# Patient Record
Sex: Male | Born: 1975 | Race: White | Hispanic: No | Marital: Single | Smoking: Current some day smoker
Health system: Southern US, Community
[De-identification: ages and names within clinical notes are randomized; demographics above are authoritative.]

## PROBLEM LIST (undated history)

## (undated) DIAGNOSIS — Z8619 Personal history of other infectious and parasitic diseases: Secondary | ICD-10-CM

## (undated) DIAGNOSIS — F32A Depression, unspecified: Secondary | ICD-10-CM

## (undated) DIAGNOSIS — E785 Hyperlipidemia, unspecified: Secondary | ICD-10-CM

## (undated) DIAGNOSIS — I10 Essential (primary) hypertension: Secondary | ICD-10-CM

## (undated) DIAGNOSIS — K648 Other hemorrhoids: Secondary | ICD-10-CM

## (undated) DIAGNOSIS — F329 Major depressive disorder, single episode, unspecified: Secondary | ICD-10-CM

## (undated) DIAGNOSIS — Z8719 Personal history of other diseases of the digestive system: Secondary | ICD-10-CM

## (undated) DIAGNOSIS — J189 Pneumonia, unspecified organism: Secondary | ICD-10-CM

## (undated) DIAGNOSIS — K589 Irritable bowel syndrome without diarrhea: Secondary | ICD-10-CM

## (undated) DIAGNOSIS — K4020 Bilateral inguinal hernia, without obstruction or gangrene, not specified as recurrent: Secondary | ICD-10-CM

## (undated) DIAGNOSIS — T8859XA Other complications of anesthesia, initial encounter: Secondary | ICD-10-CM

## (undated) DIAGNOSIS — I5022 Chronic systolic (congestive) heart failure: Secondary | ICD-10-CM

## (undated) DIAGNOSIS — F419 Anxiety disorder, unspecified: Secondary | ICD-10-CM

## (undated) DIAGNOSIS — T7840XA Allergy, unspecified, initial encounter: Secondary | ICD-10-CM

## (undated) DIAGNOSIS — K219 Gastro-esophageal reflux disease without esophagitis: Secondary | ICD-10-CM

## (undated) DIAGNOSIS — R112 Nausea with vomiting, unspecified: Secondary | ICD-10-CM

## (undated) DIAGNOSIS — Z9889 Other specified postprocedural states: Secondary | ICD-10-CM

## (undated) DIAGNOSIS — F3171 Bipolar disorder, in partial remission, most recent episode hypomanic: Secondary | ICD-10-CM

## (undated) DIAGNOSIS — F319 Bipolar disorder, unspecified: Secondary | ICD-10-CM

## (undated) DIAGNOSIS — T4145XA Adverse effect of unspecified anesthetic, initial encounter: Secondary | ICD-10-CM

## (undated) DIAGNOSIS — F1021 Alcohol dependence, in remission: Secondary | ICD-10-CM

## (undated) DIAGNOSIS — M5090 Cervical disc disorder, unspecified, unspecified cervical region: Secondary | ICD-10-CM

## (undated) DIAGNOSIS — F1220 Cannabis dependence, uncomplicated: Secondary | ICD-10-CM

## (undated) DIAGNOSIS — E7889 Other lipoprotein metabolism disorders: Secondary | ICD-10-CM

## (undated) HISTORY — PX: NOSE SURGERY: SHX723

## (undated) HISTORY — DX: Major depressive disorder, single episode, unspecified: F32.9

## (undated) HISTORY — DX: Personal history of other infectious and parasitic diseases: Z86.19

## (undated) HISTORY — DX: Depression, unspecified: F32.A

## (undated) HISTORY — DX: Gastro-esophageal reflux disease without esophagitis: K21.9

## (undated) HISTORY — DX: Allergy, unspecified, initial encounter: T78.40XA

## (undated) HISTORY — DX: Irritable bowel syndrome without diarrhea: K58.9

## (undated) HISTORY — DX: Bipolar disorder, unspecified: F31.9

---
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
## 2008-02-03 HISTORY — PX: ANTERIOR CRUCIATE LIGAMENT REPAIR: SHX115

## 2010-08-26 ENCOUNTER — Other Ambulatory Visit: Payer: Self-pay | Admitting: Gastroenterology

## 2010-08-29 ENCOUNTER — Ambulatory Visit
Admission: RE | Admit: 2010-08-29 | Discharge: 2010-08-29 | Disposition: A | Discharge status: Home / Self Care | Payer: PRIVATE HEALTH INSURANCE | Source: Ambulatory Visit | Attending: Gastroenterology | Admitting: Gastroenterology

## 2010-08-29 MED ORDER — IOHEXOL 300 MG/ML  SOLN
100.0000 mL | Freq: Once | INTRAMUSCULAR | Status: AC | PRN
  Administered 2010-08-29: 100 mL via INTRAVENOUS

## 2011-02-24 ENCOUNTER — Encounter: Payer: Self-pay | Admitting: Family Medicine

## 2011-02-24 ENCOUNTER — Ambulatory Visit (INDEPENDENT_AMBULATORY_CARE_PROVIDER_SITE_OTHER): Payer: PRIVATE HEALTH INSURANCE | Admitting: Family Medicine

## 2011-02-24 DIAGNOSIS — F3130 Bipolar disorder, current episode depressed, mild or moderate severity, unspecified: Secondary | ICD-10-CM

## 2011-02-24 DIAGNOSIS — F319 Bipolar disorder, unspecified: Secondary | ICD-10-CM

## 2011-02-24 DIAGNOSIS — I10 Essential (primary) hypertension: Secondary | ICD-10-CM

## 2011-02-24 DIAGNOSIS — Z23 Encounter for immunization: Secondary | ICD-10-CM

## 2011-02-24 DIAGNOSIS — K4020 Bilateral inguinal hernia, without obstruction or gangrene, not specified as recurrent: Secondary | ICD-10-CM

## 2011-02-24 HISTORY — DX: Bilateral inguinal hernia, without obstruction or gangrene, not specified as recurrent: K40.20

## 2011-02-24 LAB — POCT URINALYSIS DIPSTICK
Glucose, UA: NEGATIVE
Ketones, UA: NEGATIVE
Spec Grav, UA: 1.015
Urobilinogen, UA: 0.2

## 2011-02-24 MED ORDER — ZOLPIDEM TARTRATE 5 MG PO TABS: 5.0000 mg | ORAL_TABLET | Freq: Every evening | ORAL | Status: DC | PRN

## 2011-02-24 MED ORDER — LOSARTAN POTASSIUM-HCTZ 50-12.5 MG PO TABS: 1.0000 | ORAL_TABLET | Freq: Every day | ORAL | Status: DC

## 2011-02-24 NOTE — Progress Notes (Signed)
Subjective:    Patient ID: Thomas Williamson, male    DOB: 14-Jul-1975, 36 y.o.   MRN: [id number]  HPI  Thomas Williamson is a 36 year old single male, nonsmoker, who comes in today as a new patient for evaluation of bipolar depression, hypertension, and bilateral inguinal hernias.  His hypertension has been treated with my card is 40 -- 12.5 daily.  He states his blood pressure home is 120/80.  BP here 140/104.  He was originally put on Depakene 500 mg 3 times a day by his psychiatrist because of bipolar depression.  He then was transitioned to lactmil 25 mg daily by a friend of his is a Therapist, sports in [place], [place], where he is originally from.  He also takes Ambien 5 mg nightly for sleep.  We discussed follow-up with Dr. Rolly Pancake, [place].  He also has bilateral inguinal hernias.  The right is greater than the left.  He is relatively asymptomatic.  He is also had a GI workup for abdominal discomfort by Dr. Doneta Public,,,,,,,,,,, workup was negative, except for one polyp.  They felt he had IBS.  Tetanus booster 2011 seasonal flu shot today.  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   HENT: Negative.   Eyes: Negative.   Respiratory: Negative.   Cardiovascular: Negative.   Gastrointestinal: Negative.   Genitourinary: Negative.   Musculoskeletal: Negative.   Skin: Negative.   Neurological: Negative.   Hematological: Negative.   Psychiatric/Behavioral: Negative.        Objective:   Physical Exam  Constitutional: He is oriented to person, place, and time. He appears well-developed and well-nourished.  HENT:  Head: Normocephalic and atraumatic.  Right Ear: External ear normal.  Left Ear: External ear normal.  Nose: Nose normal.  Mouth/Throat: Oropharynx is clear and moist.  Eyes: Conjunctivae and EOM are normal. Pupils are equal, round, and reactive to light.  Neck:  Normal range of motion. Neck supple. No JVD present. No tracheal deviation present. No thyromegaly present.  Cardiovascular: Normal rate, regular rhythm, normal heart sounds and intact distal pulses.  Exam reveals no gallop and no friction rub.   No murmur heard. Pulmonary/Chest: Effort normal and breath sounds normal. No stridor. No respiratory distress. He has no wheezes. He has no rales. He exhibits no tenderness.  Abdominal: Soft. Bowel sounds are normal. He exhibits no distension and no mass. There is no tenderness. There is no rebound and no guarding.  Genitourinary: Prostate normal and penis normal. No penile tenderness.       Bilateral inguinal hernias, right greater than left  Musculoskeletal: Normal range of motion. He exhibits no edema and no tenderness.  Lymphadenopathy:    He has no cervical adenopathy.  Neurological: He is alert and oriented to person, place, and time. He has normal reflexes. No cranial nerve deficit. He exhibits normal muscle tone.  Skin: Skin is warm and dry. No rash noted. No erythema. No pallor.       Total body skin exam shows a scar right chest wall, anterior from previous lesion removed.  It was not a cancer.  He has numerous freckles all of which appear to be benign  Psychiatric: He has a normal mood and affect. His behavior is normal. Judgment and thought content normal.          Assessment & Plan:  Healthy male.  Hypertension switched to Hyzaar 50 -- 25.  BP checked daily at home to be sure blood pressure stays stable.  History of bipolar depression.  Continue lactmil....... Follow-up by Dr. Rolly Pancake, [place], he  Bilateral inguinal hernias asymptomatic.  History of right ACL and MCL tears with subsequent surgical repair.  History of colon polyp and IBS

## 2011-02-24 NOTE — Patient Instructions (Signed)
Switched to the Hyzaar,,,,,,,,,, one tablet daily, and check your blood pressure daily in the morning for the next 4 weeks to be sure your blood pressure stays normal,,,,,,,,,,, 135/85.  Call Dr. Rolly Pancake, [place], and make an appointment to see her ASAP for follow-up.  Return to see [name] in one year for general checkup sooner if any problems

## 2011-02-25 LAB — HEPATIC FUNCTION PANEL
ALT: 36 U/L (ref 0–53)
Total Protein: 8.2 g/dL (ref 6.0–8.3)

## 2011-02-25 LAB — CBC WITH DIFFERENTIAL/PLATELET
Basophils Relative: 1.2 % (ref 0.0–3.0)
Eosinophils Absolute: 0.1 10*3/uL (ref 0.0–0.7)
Eosinophils Relative: 0.7 % (ref 0.0–5.0)
Lymphocytes Relative: 21.6 % (ref 12.0–46.0)
Monocytes Relative: 7.3 % (ref 3.0–12.0)
Neutrophils Relative %: 69.2 % (ref 43.0–77.0)
RBC: 4.25 Mil/uL (ref 4.22–5.81)
WBC: 11.9 10*3/uL — ABNORMAL HIGH (ref 4.5–10.5)

## 2011-02-25 LAB — BASIC METABOLIC PANEL
CO2: 27 mEq/L (ref 19–32)
Calcium: 10 mg/dL (ref 8.4–10.5)
Sodium: 137 mEq/L (ref 135–145)

## 2011-02-25 LAB — LIPID PANEL
HDL: 53.4 mg/dL (ref >39.00)
Triglycerides: 429 mg/dL — ABNORMAL HIGH (ref 0.0–149.0)

## 2011-02-25 LAB — TSH: TSH: 1.38 u[IU]/mL (ref 0.35–5.50)

## 2011-02-26 ENCOUNTER — Ambulatory Visit: Payer: PRIVATE HEALTH INSURANCE | Admitting: Family Medicine

## 2011-07-10 ENCOUNTER — Telehealth: Payer: Self-pay | Admitting: Family Medicine

## 2011-07-10 NOTE — Telephone Encounter (Signed)
Called pt back, states he has IBS and feels like that is why he is having some abdominal pain, stated he has not taken any of his IBS medication because it makes him feel "bloated" he is feeling a little "run down" but thinks it because he has been fighting a cold, since he was congested and coughing all week, stated the coughing and congestion are much better. Pt said he does not want to be seen until insurance kicks unless absolutely necessary. Pt denies any chest pain, raceing pulse or dizziness. I told him to rest get plenty of fluids and call us back this afternoon to let us know how he is feeling.

## 2011-07-10 NOTE — Telephone Encounter (Signed)
Pt just recently started a new job and does not have insurance for another 30 days pt is aware that an office visit starts at $125.00 for self pay, requested to speak to nurse before we scheduled an appt. Pt is Hot and clammy, has fatigue,cough and congestion. Pt requesting to be contacted

## 2011-12-11 ENCOUNTER — Encounter: Payer: Self-pay | Admitting: Internal Medicine

## 2011-12-11 ENCOUNTER — Ambulatory Visit (INDEPENDENT_AMBULATORY_CARE_PROVIDER_SITE_OTHER): Payer: 59 | Admitting: Internal Medicine

## 2011-12-11 VITALS — BP 138/100 | Temp 98.2°F | Wt 186.0 lb

## 2011-12-11 DIAGNOSIS — L739 Follicular disorder, unspecified: Secondary | ICD-10-CM | POA: Insufficient documentation

## 2011-12-11 DIAGNOSIS — L738 Other specified follicular disorders: Secondary | ICD-10-CM

## 2011-12-11 MED ORDER — DOXYCYCLINE HYCLATE 100 MG PO TABS: 100.0000 mg | ORAL_TABLET | Freq: Two times a day (BID) | ORAL | Status: DC

## 2011-12-11 NOTE — Patient Instructions (Signed)
Apply warm compression to affected areas 2-3 times per day for 3-5 days Please call our office if your symptoms do not improve or gets worse.

## 2011-12-11 NOTE — Progress Notes (Signed)
  Subjective:    Patient ID: Thomas Williamson, male    DOB: [birth year], 36 y.o.   MRN: [id number]  HPI  36 year old white male with history of bipolar disorder and hypertension complains of intermittent red bumps along his lower face and jaw line. Symptoms have been on and off for several months. Symptoms may be related to shaving. Occasionally skin lesions will drained like a pimple.   Review of Systems Negative for fever or chills.  Past Medical History  Diagnosis Date  . Allergy   . GERD (gastroesophageal reflux disease)   . IBS (irritable bowel syndrome)     History   Social History  . Marital Status: Unknown    Spouse Name: N/A    Number of Children: N/A  . Years of Education: N/A   Occupational History  . Not on file.   Social History Main Topics  . Smoking status: Never Smoker   . Smokeless tobacco: Not on file  . Alcohol Use: Yes  . Drug Use: No  . Sexually Active:    Other Topics Concern  . Not on file   Social History Narrative  . No narrative on file    No past surgical history on file.  Family History  Problem Relation Age of Onset  . Hypertension Mother   . Hypertension Father     Not on File  Current Outpatient Prescriptions on File Prior to Visit  Medication Sig Dispense Refill  . citalopram (CELEXA) 20 MG tablet Take 20 mg by mouth daily.      Marland Kitchen lamoTRIgine (LAMICTAL) 100 MG tablet Take 100 mg by mouth daily.      Marland Kitchen lamoTRIgine (LAMICTAL) 25 MG tablet Take 25 mg by mouth daily.      Marland Kitchen losartan-hydrochlorothiazide (HYZAAR) 50-12.5 MG per tablet Take 1 tablet by mouth daily.  100 tablet  3    BP 138/100  Temp 98.2 F (36.8 C) (Oral)  Wt 186 lb (84.369 kg)       Objective:   Physical Exam  Constitutional: He appears well-developed and well-nourished.  HENT:  Head: Normocephalic and atraumatic.  Cardiovascular: Normal rate, regular rhythm and normal heart sounds.   Pulmonary/Chest: Effort normal and breath sounds normal. He has no  wheezes.  Skin:       3-4 small carbuncles along right jaw line and 1-2 lesions along left jaw line          Assessment & Plan:

## 2011-12-11 NOTE — Assessment & Plan Note (Signed)
36 year old white male with signs and symptoms of folliculitis of the face. Treat with doxycycline 100 mg twice daily for 10 days. Patient advised to use warm compress. He also understands to sterilize his razor after each use. Patient will also try applying triple antibiotic ointment at bedtime.  Patient advised to call office if symptoms persist or worsen.

## 2012-02-16 ENCOUNTER — Ambulatory Visit (INDEPENDENT_AMBULATORY_CARE_PROVIDER_SITE_OTHER): Payer: 59 | Admitting: Family Medicine

## 2012-02-16 ENCOUNTER — Encounter: Payer: Self-pay | Admitting: Family Medicine

## 2012-02-16 VITALS — BP 160/108 | Temp 98.3°F | Wt 180.0 lb

## 2012-02-16 DIAGNOSIS — K589 Irritable bowel syndrome without diarrhea: Secondary | ICD-10-CM

## 2012-02-16 DIAGNOSIS — K4020 Bilateral inguinal hernia, without obstruction or gangrene, not specified as recurrent: Secondary | ICD-10-CM

## 2012-02-16 NOTE — Progress Notes (Signed)
  Subjective:    Patient ID: Thomas Williamson, male    DOB: [birth year], 37 y.o.   MRN: [id number]  HPI Tyreque is a 37 year old married male nonsmoker who comes in today for evaluation of right and left inguinal hernias  The hernias occurred about a year and a half ago. Recently they seem to be getting worse. He's not having soreness a specimen the left side when he coughs. He has no bowel nor bladder dysfunction except a history of IBS for which he seen Dr. Loraine Leriche m.      Review of Systems    general review of systems otherwise negative Objective:   Physical Exam Well-developed and nourished male no acute distress in the standing position the bulge in the right groin is larger than the left.       Assessment & Plan:  Bilaterally the hernias plan consult with Dr. Ovidio Kin

## 2012-02-16 NOTE — Patient Instructions (Signed)
Dr. Ovidio Kin general surgeon at ,,,,,,, [phone] for consult

## 2012-02-18 ENCOUNTER — Encounter (HOSPITAL_COMMUNITY): Payer: Self-pay | Admitting: Pharmacy Technician

## 2012-02-18 ENCOUNTER — Ambulatory Visit (INDEPENDENT_AMBULATORY_CARE_PROVIDER_SITE_OTHER): Payer: 59 | Admitting: Surgery

## 2012-02-18 ENCOUNTER — Encounter (INDEPENDENT_AMBULATORY_CARE_PROVIDER_SITE_OTHER): Payer: Self-pay | Admitting: Surgery

## 2012-02-18 VITALS — BP 140/90 | HR 64 | Temp 97.0°F | Resp 16 | Ht 70.0 in | Wt 177.6 lb

## 2012-02-18 DIAGNOSIS — K4020 Bilateral inguinal hernia, without obstruction or gangrene, not specified as recurrent: Secondary | ICD-10-CM

## 2012-02-18 NOTE — Progress Notes (Signed)
Re:   Thomas Williamson DOB:   [birth year] MRN:   [id number]  ASSESSMENT AND PLAN: 1.  Bilateral inguinal hernias, right > left.  I discussed the indications and complications of hernia surgery with the patient.  I discussed both the laparoscopic and open approach to hernia repair..  The potential risks of hernia surgery include, but are not limited to, bleeding, infection, open surgery, nerve injury, and recurrence of the hernia.  I provided the patient literature about hernia surgery.   He is familiar with hernia surgery since I did his father's hernia (open).  We talked about him being out of work for 2 weeks post op.  2.  Hypertension x 2 years 3.  Bipolar depression  Followed by Dr. Emerson Monte 4.  Irritable bowel syndrome  Has seen Dr. Judie Petit. Magod 5.  Hypercholesterolemia  Chief Complaint  Patient presents with  . New Evaluation    eval BIH   REFERRING PHYSICIAN: TODD,JEFFREY ALLEN, MD  HISTORY OF PRESENT ILLNESS: Thomas Williamson is a 37 y.o. (DOB: [birth year])  white male whose primary care physician is TODD,JEFFREY ALLEN, MD and comes to me today for bilateral inguinal hernia.  The hernias were actually noticed about 2 years ago on a CT scan when he was being evaluated for abdominal pain. He was seen by Dr. Leary Roca who diagnosed irritable bowel syndrome. Dr. Leary Roca.  He had a cold about a week or 2 ago with a lot of sneezing and coughing. The hernias seemed to bother him more after this.  He has no history of stomach, liver, gallbladder, or pancreas disease. He has had no prior abdominal surgery.    Past Medical History  Diagnosis Date  . Allergy   . GERD (gastroesophageal reflux disease)   . IBS (irritable bowel syndrome)       Past Surgical History  Procedure Date  . Anterior cruciate ligament repair 2010  . Nose surgery [id number]      Current Outpatient Prescriptions  Medication Sig Dispense Refill  . ALPRAZolam (XANAX) 0.5 MG tablet Take 0.5 mg by mouth as  needed.      . citalopram (CELEXA) 20 MG tablet Take 20 mg by mouth daily.      Marland Kitchen doxycycline (VIBRA-TABS) 100 MG tablet Take 1 tablet (100 mg total) by mouth 2 (two) times daily.  20 tablet  0  . lamoTRIgine (LAMICTAL) 200 MG tablet Take 200 mg by mouth daily.      Marland Kitchen losartan-hydrochlorothiazide (HYZAAR) 50-12.5 MG per tablet Take 1 tablet by mouth daily.  100 tablet  3     Not on File  REVIEW OF SYSTEMS: Skin:  No history of rash.  No history of abnormal moles. Infection:  No history of hepatitis or HIV.  No history of MRSA. Neurologic:  No history of stroke.  No history of seizure.  No history of headaches. Cardiac:  Hypertension x 2 years. Pulmonary:  Does not smoke cigarettes.  No asthma or bronchitis.  No OSA/CPAP.  Endocrine:  No diabetes. No thyroid disease. Gastrointestinal:  IBS evaluated by Dr. Berton Lan. Urologic:  No history of kidney stones.  No history of bladder infections. Musculoskeletal:  No history of joint or back disease. Hematologic:  No bleeding disorder.  No history of anemia.  Not anticoagulated. Psycho-social:  Bipolar depression - followed by Dr. Emerson Monte.  He sees her every 4 to 6 months.  SOCIAL and FAMILY HISTORY: Unmarried. Works with Airline pilot for [employer]. I did his father's  hernia, Paula Libra.  PHYSICAL EXAM: BP 140/90  Pulse 64  Temp 97 F (36.1 C) (Temporal)  Resp 16  Ht 5\' 10"  (1.778 m)  Wt 177 lb 9.6 oz (80.559 kg)  BMI 25.48 kg/m2  General: WN WM who is alert and generally healthy appearing.  HEENT: Normal. Pupils equal. Neck: Supple. No mass.  No thyroid mass. Lymph Nodes:  No supraclavicular or cervical nodes. Lungs: Clear to auscultation and symmetric breath sounds. Heart:  RRR. No murmur or rub.  Abdomen: Soft. No mass. No tenderness.  Normal bowel sounds.  No abdominal scars.  Bilateral inguinal hernias - R>L.  Both are reducible. Rectal: Not done. Extremities:  Good strength and ROM  in upper and lower  extremities. Neurologic:  Grossly intact to motor and sensory function. Psychiatric: Has normal mood and affect. Behavior is normal.   DATA REVIEWED: Notes from Dr. Tawanna Cooler.  Ovidio Kin, MD,  [facility] [address] [place]  [address]   [place], [place]    [address] Phone:  [phone] FAX:  [phone]

## 2012-02-18 NOTE — Progress Notes (Addendum)
Need orders for 1-21 surgery, pre op is 1-17 at 100 pm, thanks

## 2012-02-19 ENCOUNTER — Encounter (HOSPITAL_COMMUNITY): Payer: Self-pay

## 2012-02-19 ENCOUNTER — Other Ambulatory Visit (INDEPENDENT_AMBULATORY_CARE_PROVIDER_SITE_OTHER): Payer: Self-pay | Admitting: Surgery

## 2012-02-19 ENCOUNTER — Encounter (HOSPITAL_COMMUNITY)
Admission: RE | Admit: 2012-02-19 | Discharge: 2012-02-19 | Disposition: A | Discharge status: Home / Self Care | Payer: 59 | Source: Ambulatory Visit | Attending: Surgery | Admitting: Surgery

## 2012-02-19 ENCOUNTER — Telehealth (INDEPENDENT_AMBULATORY_CARE_PROVIDER_SITE_OTHER): Payer: Self-pay

## 2012-02-19 ENCOUNTER — Ambulatory Visit (HOSPITAL_COMMUNITY)
Admission: RE | Admit: 2012-02-19 | Discharge: 2012-02-19 | Disposition: A | Discharge status: Home / Self Care | Payer: 59 | Source: Ambulatory Visit | Attending: Surgery | Admitting: Surgery

## 2012-02-19 DIAGNOSIS — Z01812 Encounter for preprocedural laboratory examination: Secondary | ICD-10-CM | POA: Insufficient documentation

## 2012-02-19 DIAGNOSIS — K4020 Bilateral inguinal hernia, without obstruction or gangrene, not specified as recurrent: Secondary | ICD-10-CM | POA: Insufficient documentation

## 2012-02-19 DIAGNOSIS — Z01810 Encounter for preprocedural cardiovascular examination: Secondary | ICD-10-CM | POA: Insufficient documentation

## 2012-02-19 DIAGNOSIS — I10 Essential (primary) hypertension: Secondary | ICD-10-CM | POA: Insufficient documentation

## 2012-02-19 HISTORY — DX: Essential (primary) hypertension: I10

## 2012-02-19 HISTORY — DX: Other specified postprocedural states: R11.2

## 2012-02-19 HISTORY — DX: Other specified postprocedural states: Z98.890

## 2012-02-19 HISTORY — DX: Anxiety disorder, unspecified: F41.9

## 2012-02-19 LAB — SURGICAL PCR SCREEN: Staphylococcus aureus: NEGATIVE

## 2012-02-19 LAB — CBC
HCT: 38.9 % — ABNORMAL LOW (ref 39.0–52.0)
MCV: 96.8 fL (ref 78.0–100.0)
Platelets: 384 10*3/uL (ref 150–400)
RBC: 4.02 MIL/uL — ABNORMAL LOW (ref 4.22–5.81)
RDW: 11.8 % (ref 11.5–15.5)
WBC: 10.4 10*3/uL (ref 4.0–10.5)

## 2012-02-19 LAB — BASIC METABOLIC PANEL
CO2: 27 mEq/L (ref 19–32)
Chloride: 95 mEq/L — ABNORMAL LOW (ref 96–112)
Creatinine, Ser: 0.82 mg/dL (ref 0.50–1.35)
GFR calc Af Amer: >90 mL/min (ref >90)
Potassium: 3.8 mEq/L (ref 3.5–5.1)

## 2012-02-19 MED ORDER — CHLORHEXIDINE GLUCONATE 4 % EX LIQD
1.0000 "application " | Freq: Once | CUTANEOUS | Status: DC
  Filled 2012-02-19: qty 15

## 2012-02-19 NOTE — Pre-Procedure Instructions (Signed)
PREOP CBC, BMET, EKG AND CXR WERE DONE TODAY AT [facility] AS PER ANESTHESIOLOGIST'S GUIDELINES.

## 2012-02-19 NOTE — Patient Instructions (Addendum)
YOUR SURGERY IS SCHEDULED AT [facility]  ON:  Tuesday  1/21  REPORT TO  SHORT STAY CENTER AT: 10:15 AM      PHONE # FOR SHORT STAY IS [phone]  DO NOT EAT  ANYTHING AFTER MIDNIGHT THE NIGHT BEFORE YOUR SURGERY.  NO FOOD, NO CHEWING GUM, NO MINTS, NO CANDIES, NO CHEWING TOBACCO.  YOU MAY HAVE CLEAR LIQUIDS TO DRINK FROM MIDNIGHT UNTIL 6:15 AM DAY OF SURGERY - LIKE WATER.   NOTHING TO DRINK AFTER 6:15 AM DAY OF SURGERY.   PLEASE TAKE THE FOLLOWING MEDICATIONS THE AM OF YOUR SURGERY WITH A FEW SIPS OF WATER:  ALPRAZOLAM, CITALOPRAM, PRILOSEC.  IF YOU USE INHALERS--USE YOUR INHALERS THE AM OF YOUR SURGERY AND BRING INHALERS TO THE HOSPITAL -TAKE TO SURGERY.    IF YOU ARE DIABETIC:  DO NOT TAKE ANY DIABETIC MEDICATIONS THE AM OF YOUR SURGERY.  IF YOU TAKE INSULIN IN THE EVENINGS--PLEASE ONLY TAKE 1/2 NORMAL EVENING DOSE THE NIGHT BEFORE YOUR SURGERY.  NO INSULIN THE AM OF YOUR SURGERY.  IF YOU HAVE SLEEP APNEA AND USE CPAP OR BIPAP--PLEASE BRING THE MASK AND THE TUBING.  DO NOT BRING YOUR MACHINE.  DO NOT BRING VALUABLES, MONEY, CREDIT CARDS.  DO NOT WEAR JEWELRY, MAKE-UP, NAIL POLISH AND NO METAL PINS OR CLIPS IN YOUR HAIR. CONTACT LENS, DENTURES / PARTIALS, GLASSES SHOULD NOT BE WORN TO SURGERY AND IN MOST CASES-HEARING AIDS WILL NEED TO BE REMOVED.  BRING YOUR GLASSES CASE, ANY EQUIPMENT NEEDED FOR YOUR CONTACT LENS. FOR PATIENTS ADMITTED TO THE HOSPITAL--CHECK OUT TIME THE DAY OF DISCHARGE IS 11:00 AM.  ALL INPATIENT ROOMS ARE PRIVATE - WITH BATHROOM, TELEPHONE, TELEVISION AND WIFI INTERNET.  IF YOU ARE BEING DISCHARGED THE SAME DAY OF YOUR SURGERY--YOU CAN NOT DRIVE YOURSELF HOME--AND SHOULD NOT GO HOME ALONE BY TAXI OR BUS.  NO DRIVING OR OPERATING MACHINERY FOR 24 HOURS FOLLOWING ANESTHESIA / PAIN MEDICATIONS.  PLEASE MAKE ARRANGEMENTS FOR SOMEONE TO BE WITH YOU AT HOME THE FIRST 24 HOURS AFTER SURGERY. RESPONSIBLE DRIVER'S NAME - [name]                                        PHONE #   [phone]                             PLEASE READ OVER ANY  FACT SHEETS THAT YOU WERE GIVEN: MRSA INFORMATION, BLOOD TRANSFUSION INFORMATION, INCENTIVE SPIROMETER INFORMATION. FAILURE TO FOLLOW THESE INSTRUCTIONS MAY RESULT IN THE CANCELLATION OF YOUR SURGERY.   PATIENT SIGNATURE_________________________________

## 2012-02-19 NOTE — Telephone Encounter (Signed)
P/O appt 03/09/12@10 :15am pt aware

## 2012-02-23 ENCOUNTER — Ambulatory Visit (HOSPITAL_COMMUNITY)
Admission: RE | Admit: 2012-02-23 | Discharge: 2012-02-23 | Disposition: A | Discharge status: Home / Self Care | Payer: 59 | Source: Ambulatory Visit | Attending: Surgery | Admitting: Surgery

## 2012-02-23 ENCOUNTER — Encounter (HOSPITAL_COMMUNITY): Payer: Self-pay | Admitting: Anesthesiology

## 2012-02-23 ENCOUNTER — Ambulatory Visit (HOSPITAL_COMMUNITY): Payer: 59 | Admitting: Anesthesiology

## 2012-02-23 ENCOUNTER — Encounter (HOSPITAL_COMMUNITY)
Admission: RE | Disposition: A | Discharge status: Home / Self Care | Payer: Self-pay | Source: Ambulatory Visit | Attending: Surgery

## 2012-02-23 ENCOUNTER — Encounter (HOSPITAL_COMMUNITY): Payer: Self-pay | Admitting: *Deleted

## 2012-02-23 DIAGNOSIS — K4020 Bilateral inguinal hernia, without obstruction or gangrene, not specified as recurrent: Secondary | ICD-10-CM

## 2012-02-23 DIAGNOSIS — K589 Irritable bowel syndrome without diarrhea: Secondary | ICD-10-CM | POA: Insufficient documentation

## 2012-02-23 DIAGNOSIS — Z79899 Other long term (current) drug therapy: Secondary | ICD-10-CM | POA: Insufficient documentation

## 2012-02-23 DIAGNOSIS — I10 Essential (primary) hypertension: Secondary | ICD-10-CM | POA: Insufficient documentation

## 2012-02-23 DIAGNOSIS — E7800 Pure hypercholesterolemia, unspecified: Secondary | ICD-10-CM | POA: Insufficient documentation

## 2012-02-23 DIAGNOSIS — K219 Gastro-esophageal reflux disease without esophagitis: Secondary | ICD-10-CM | POA: Insufficient documentation

## 2012-02-23 HISTORY — PX: INGUINAL HERNIA REPAIR: SHX194

## 2012-02-23 HISTORY — PX: INSERTION OF MESH: SHX5868

## 2012-02-23 SURGERY — REPAIR, HERNIA, INGUINAL, BILATERAL, LAPAROSCOPIC
Anesthesia: General | Wound class: Clean

## 2012-02-23 MED ORDER — LACTATED RINGERS IV SOLN
INTRAVENOUS | Status: DC
  Administered 2012-02-23: 15:00:00 via INTRAVENOUS
  Administered 2012-02-23: 1000 mL via INTRAVENOUS

## 2012-02-23 MED ORDER — FENTANYL CITRATE 0.05 MG/ML IJ SOLN
INTRAMUSCULAR | Status: AC
  Filled 2012-02-23: qty 2

## 2012-02-23 MED ORDER — KETOROLAC TROMETHAMINE 30 MG/ML IJ SOLN
15.0000 mg | Freq: Once | INTRAMUSCULAR | Status: AC | PRN
  Administered 2012-02-23: 30 mg via INTRAVENOUS

## 2012-02-23 MED ORDER — ACETAMINOPHEN 10 MG/ML IV SOLN
INTRAVENOUS | Status: AC
  Filled 2012-02-23: qty 100

## 2012-02-23 MED ORDER — HYDROMORPHONE HCL PF 1 MG/ML IJ SOLN
INTRAMUSCULAR | Status: AC
  Filled 2012-02-23: qty 1

## 2012-02-23 MED ORDER — ACETAMINOPHEN 10 MG/ML IV SOLN
INTRAVENOUS | Status: DC | PRN
  Administered 2012-02-23: 1000 mg via INTRAVENOUS

## 2012-02-23 MED ORDER — LIDOCAINE HCL (CARDIAC) 20 MG/ML IV SOLN
INTRAVENOUS | Status: DC | PRN
  Administered 2012-02-23: 100 mg via INTRAVENOUS

## 2012-02-23 MED ORDER — GLYCOPYRROLATE 0.2 MG/ML IJ SOLN
INTRAMUSCULAR | Status: DC | PRN
  Administered 2012-02-23: .6 mg via INTRAVENOUS

## 2012-02-23 MED ORDER — CEFAZOLIN SODIUM-DEXTROSE 2-3 GM-% IV SOLR
INTRAVENOUS | Status: AC
  Filled 2012-02-23: qty 50

## 2012-02-23 MED ORDER — SCOPOLAMINE 1 MG/3DAYS TD PT72
1.0000 | MEDICATED_PATCH | TRANSDERMAL | Status: DC
  Administered 2012-02-23: 1.5 mg via TRANSDERMAL
  Filled 2012-02-23: qty 1

## 2012-02-23 MED ORDER — CEFAZOLIN SODIUM-DEXTROSE 2-3 GM-% IV SOLR
2.0000 g | INTRAVENOUS | Status: AC
  Administered 2012-02-23: 2 g via INTRAVENOUS

## 2012-02-23 MED ORDER — NEOSTIGMINE METHYLSULFATE 1 MG/ML IJ SOLN
INTRAMUSCULAR | Status: DC | PRN
  Administered 2012-02-23: 4 mg via INTRAVENOUS

## 2012-02-23 MED ORDER — ONDANSETRON HCL 4 MG/2ML IJ SOLN
INTRAMUSCULAR | Status: DC | PRN
  Administered 2012-02-23: 4 mg via INTRAVENOUS

## 2012-02-23 MED ORDER — MIDAZOLAM HCL 5 MG/5ML IJ SOLN
INTRAMUSCULAR | Status: DC | PRN
  Administered 2012-02-23: 2 mg via INTRAVENOUS

## 2012-02-23 MED ORDER — OXYCODONE HCL 5 MG PO TABS
5.0000 mg | ORAL_TABLET | Freq: Once | ORAL | Status: AC
  Administered 2012-02-23: 5 mg via ORAL
  Filled 2012-02-23: qty 1

## 2012-02-23 MED ORDER — BUPIVACAINE HCL (PF) 0.25 % IJ SOLN
INTRAMUSCULAR | Status: AC
  Filled 2012-02-23: qty 30

## 2012-02-23 MED ORDER — 0.9 % SODIUM CHLORIDE (POUR BTL) OPTIME
TOPICAL | Status: DC | PRN
  Administered 2012-02-23: 1000 mL

## 2012-02-23 MED ORDER — KETOROLAC TROMETHAMINE 30 MG/ML IJ SOLN
INTRAMUSCULAR | Status: AC
  Filled 2012-02-23: qty 1

## 2012-02-23 MED ORDER — FENTANYL CITRATE 0.05 MG/ML IJ SOLN
INTRAMUSCULAR | Status: DC | PRN
  Administered 2012-02-23 (×5): 50 ug via INTRAVENOUS

## 2012-02-23 MED ORDER — PROMETHAZINE HCL 25 MG/ML IJ SOLN: 6.2500 mg | INTRAMUSCULAR | Status: DC | PRN

## 2012-02-23 MED ORDER — FENTANYL CITRATE 0.05 MG/ML IJ SOLN
25.0000 ug | INTRAMUSCULAR | Status: DC | PRN
  Administered 2012-02-23 (×2): 50 ug via INTRAVENOUS
  Administered 2012-02-23 (×2): 25 ug via INTRAVENOUS

## 2012-02-23 MED ORDER — CISATRACURIUM BESYLATE (PF) 10 MG/5ML IV SOLN
INTRAVENOUS | Status: DC | PRN
  Administered 2012-02-23: 16 mg via INTRAVENOUS

## 2012-02-23 MED ORDER — BUPIVACAINE HCL (PF) 0.25 % IJ SOLN
INTRAMUSCULAR | Status: DC | PRN
  Administered 2012-02-23: 30 mL

## 2012-02-23 MED ORDER — PROPOFOL 10 MG/ML IV BOLUS
INTRAVENOUS | Status: DC | PRN
  Administered 2012-02-23: 150 mg via INTRAVENOUS

## 2012-02-23 MED ORDER — OXYCODONE HCL 5 MG PO TABS: 5.0000 mg | ORAL_TABLET | Freq: Four times a day (QID) | ORAL | Status: DC | PRN

## 2012-02-23 MED ORDER — SCOPOLAMINE 1 MG/3DAYS TD PT72
MEDICATED_PATCH | TRANSDERMAL | Status: AC
  Filled 2012-02-23: qty 1

## 2012-02-23 SURGICAL SUPPLY — 35 items
BENZOIN TINCTURE PRP APPL 2/3 (GAUZE/BANDAGES/DRESSINGS) IMPLANT
CHLORAPREP W/TINT 26ML (MISCELLANEOUS) ×3 IMPLANT
CLOTH BEACON ORANGE TIMEOUT ST (SAFETY) ×3 IMPLANT
DECANTER SPIKE VIAL GLASS SM (MISCELLANEOUS) IMPLANT
DERMABOND ADVANCED (GAUZE/BANDAGES/DRESSINGS) ×1
DERMABOND ADVANCED .7 DNX12 (GAUZE/BANDAGES/DRESSINGS) ×2 IMPLANT
DISSECT BALLN SPACEMKR + OVL (BALLOONS) ×3
DISSECTOR BALLN SPACEMKR + OVL (BALLOONS) ×2 IMPLANT
DISSECTOR BLUNT TIP ENDO 5MM (MISCELLANEOUS) IMPLANT
DRAPE LAPAROSCOPIC ABDOMINAL (DRAPES) ×3 IMPLANT
DRAPE UTILITY 15X26 (DRAPE) ×3 IMPLANT
ELECT REM PT RETURN 9FT ADLT (ELECTROSURGICAL) ×3
ELECTRODE REM PT RTRN 9FT ADLT (ELECTROSURGICAL) ×2 IMPLANT
GLOVE BIOGEL PI IND STRL 7.0 (GLOVE) ×2 IMPLANT
GLOVE BIOGEL PI INDICATOR 7.0 (GLOVE) ×1
GLOVE SURG SIGNA 7.5 PF LTX (GLOVE) ×3 IMPLANT
GOWN STRL NON-REIN LRG LVL3 (GOWN DISPOSABLE) IMPLANT
GOWN STRL REIN XL XLG (GOWN DISPOSABLE) ×9 IMPLANT
KIT BASIN OR (CUSTOM PROCEDURE TRAY) ×3 IMPLANT
MESH C-QUR LITE 10.5X14.6CM (Mesh General) ×6 IMPLANT
NEEDLE INSUFFLATION 14GA 120MM (NEEDLE) IMPLANT
SET IRRIG TUBING LAPAROSCOPIC (IRRIGATION / IRRIGATOR) IMPLANT
SOLUTION ANTI FOG 6CC (MISCELLANEOUS) ×3 IMPLANT
STRIP CLOSURE SKIN 1/2X4 (GAUZE/BANDAGES/DRESSINGS) IMPLANT
SUT MON AB 5-0 PS2 18 (SUTURE) IMPLANT
SUT VIC AB 3-0 SH 18 (SUTURE) IMPLANT
SUT VIC AB 3-0 SH 27 (SUTURE) ×1
SUT VIC AB 3-0 SH 27XBRD (SUTURE) ×2 IMPLANT
SUT VIC AB 5-0 PS2 18 (SUTURE) ×3 IMPLANT
TACKER 5MM HERNIA 3.5CML NAB (ENDOMECHANICALS) ×6 IMPLANT
TOWEL OR 17X26 10 PK STRL BLUE (TOWEL DISPOSABLE) ×6 IMPLANT
TRAY FOLEY CATH 14FRSI W/METER (CATHETERS) ×3 IMPLANT
TRAY LAP CHOLE (CUSTOM PROCEDURE TRAY) ×3 IMPLANT
TROCAR XCEL BLADELESS 5X75MML (TROCAR) ×6 IMPLANT
TUBING INSUFFLATION 10FT LAP (TUBING) ×3 IMPLANT

## 2012-02-23 NOTE — Interval H&P Note (Signed)
History and Physical Interval Note:  02/23/2012 12:43 PM  Thomas Williamson  has presented today for surgery, with the diagnosis of bilateral inguinal hernia  The various methods of treatment have been discussed with the patient and family.  Both parents are here.  After consideration of risks, benefits and other options for treatment, the patient has consented to  Procedure(s) (LRB) with comments: LAPAROSCOPIC BILATERAL INGUINAL HERNIA REPAIR (Bilateral) - Laparoscopic Bilateral Inguinal Hernia Repair as a surgical intervention .    The patient's history has been reviewed, patient examined, no change in status, stable for surgery.  I have reviewed the patient's chart and labs.  Questions were answered to the patient's satisfaction.     Spenser Cong H

## 2012-02-23 NOTE — Anesthesia Preprocedure Evaluation (Signed)
Anesthesia Evaluation  Patient identified by MRN, date of birth, ID band Patient awake    Reviewed: Allergy & Precautions, H&P , NPO status , Patient's Chart, lab work & pertinent test results  History of Anesthesia Complications (+) PONV  Airway Mallampati: II TM Distance: >3 FB Neck ROM: Full    Dental No notable dental hx.    Pulmonary neg pulmonary ROS,  breath sounds clear to auscultation  Pulmonary exam normal       Cardiovascular hypertension, Pt. on medications Rhythm:Regular Rate:Normal     Neuro/Psych Bipolar Disorder negative neurological ROS     GI/Hepatic negative GI ROS, Neg liver ROS,   Endo/Other  negative endocrine ROS  Renal/GU negative Renal ROS  negative genitourinary   Musculoskeletal negative musculoskeletal ROS (+)   Abdominal   Peds negative pediatric ROS (+)  Hematology negative hematology ROS (+)   Anesthesia Other Findings   Reproductive/Obstetrics negative OB ROS                           Anesthesia Physical Anesthesia Plan  ASA: II  Anesthesia Plan: General   Post-op Pain Management:    Induction: Intravenous  Airway Management Planned: Oral ETT  Additional Equipment:   Intra-op Plan:   Post-operative Plan: Extubation in OR  Informed Consent: I have reviewed the patients History and Physical, chart, labs and discussed the procedure including the risks, benefits and alternatives for the proposed anesthesia with the patient or authorized representative who has indicated his/her understanding and acceptance.   Dental advisory given  Plan Discussed with: CRNA and Surgeon  Anesthesia Plan Comments:         Anesthesia Quick Evaluation

## 2012-02-23 NOTE — Anesthesia Postprocedure Evaluation (Signed)
  Anesthesia Post-op Note  Patient: Thomas Williamson  Procedure(s) Performed: Procedure(s) (LRB): LAPAROSCOPIC BILATERAL INGUINAL HERNIA REPAIR (Bilateral) INSERTION OF MESH (N/A)  Patient Location: PACU  Anesthesia Type: General  Level of Consciousness: awake and alert   Airway and Oxygen Therapy: Patient Spontanous Breathing  Post-op Pain: mild  Post-op Assessment: Post-op Vital signs reviewed, Patient's Cardiovascular Status Stable, Respiratory Function Stable, Patent Airway and No signs of Nausea or vomiting  Last Vitals:  Filed Vitals:   02/23/12 1530  BP: 146/95  Pulse: 90  Temp:   Resp: 16    Post-op Vital Signs: stable   Complications: No apparent anesthesia complications

## 2012-02-23 NOTE — Transfer of Care (Signed)
Immediate Anesthesia Transfer of Care Note  Patient: Thomas Williamson  Procedure(s) Performed: Procedure(s) (LRB) with comments: LAPAROSCOPIC BILATERAL INGUINAL HERNIA REPAIR (Bilateral) - Laparoscopic Bilateral Inguinal Hernia Repair with mesh INSERTION OF MESH (N/A)  Patient Location: PACU  Anesthesia Type:General  Level of Consciousness: awake, alert  and oriented  Airway & Oxygen Therapy: Patient Spontanous Breathing and Patient connected to face mask oxygen  Post-op Assessment: Report given to PACU RN and Post -op Vital signs reviewed and stable  Post vital signs: Reviewed and stable  Complications: No apparent anesthesia complications

## 2012-02-23 NOTE — H&P (View-Only) (Signed)
 Re:   Thomas Williamson DOB:   07/09/1975 MRN:   [id number]  ASSESSMENT AND PLAN: 1.  Bilateral inguinal hernias, right > left.  I discussed the indications and complications of hernia surgery with the patient.  I discussed both the laparoscopic and open approach to hernia repair..  The potential risks of hernia surgery include, but are not limited to, bleeding, infection, open surgery, nerve injury, and recurrence of the hernia.  I provided the patient literature about hernia surgery.   He is familiar with hernia surgery since I did his father's hernia (open).  We talked about him being out of work for 2 weeks post op.  2.  Hypertension x 2 years 3.  Bipolar depression  Followed by Dr. Parrish McKinney 4.  Irritable bowel syndrome  Has seen Dr. M. Magod 5.  Hypercholesterolemia  Chief Complaint  Patient presents with  . New Evaluation    eval BIH   REFERRING PHYSICIAN: TODD,JEFFREY ALLEN, MD  HISTORY OF PRESENT ILLNESS: Thomas Williamson is a 37 y.o. (DOB: 01/18/1976)  white male whose primary care physician is TODD,JEFFREY ALLEN, MD and comes to me today for bilateral inguinal hernia.  The hernias were actually noticed about 2 years ago on a CT scan when he was being evaluated for abdominal pain. He was seen by Dr. Mark Magod who diagnosed irritable bowel syndrome. Dr. Mark Magod.  He had a cold about a week or 2 ago with a lot of sneezing and coughing. The hernias seemed to bother him more after this.  He has no history of stomach, liver, gallbladder, or pancreas disease. He has had no prior abdominal surgery.    Past Medical History  Diagnosis Date  . Allergy   . GERD (gastroesophageal reflux disease)   . IBS (irritable bowel syndrome)       Past Surgical History  Procedure Date  . Anterior cruciate ligament repair 2010  . Nose surgery 2000,2012      Current Outpatient Prescriptions  Medication Sig Dispense Refill  . ALPRAZolam (XANAX) 0.5 MG tablet Take 0.5 mg by mouth as  needed.      . citalopram (CELEXA) 20 MG tablet Take 20 mg by mouth daily.      . doxycycline (VIBRA-TABS) 100 MG tablet Take 1 tablet (100 mg total) by mouth 2 (two) times daily.  20 tablet  0  . lamoTRIgine (LAMICTAL) 200 MG tablet Take 200 mg by mouth daily.      . losartan-hydrochlorothiazide (HYZAAR) 50-12.5 MG per tablet Take 1 tablet by mouth daily.  100 tablet  3     Not on File  REVIEW OF SYSTEMS: Skin:  No history of rash.  No history of abnormal moles. Infection:  No history of hepatitis or HIV.  No history of MRSA. Neurologic:  No history of stroke.  No history of seizure.  No history of headaches. Cardiac:  Hypertension x 2 years. Pulmonary:  Does not smoke cigarettes.  No asthma or bronchitis.  No OSA/CPAP.  Endocrine:  No diabetes. No thyroid disease. Gastrointestinal:  IBS evaluated by Dr. M. Magod. Urologic:  No history of kidney stones.  No history of bladder infections. Musculoskeletal:  No history of joint or back disease. Hematologic:  No bleeding disorder.  No history of anemia.  Not anticoagulated. Psycho-social:  Bipolar depression - followed by Dr. Parrish McKinney.  He sees her every 4 to 6 months.  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 I did his father's   hernia, [name].  PHYSICAL EXAM: BP 140/90  Pulse 64  Temp 97 F (36.1 C) (Temporal)  Resp 16  Ht 5' 10" (1.778 m)  Wt 177 lb 9.6 oz (80.559 kg)  BMI 25.48 kg/m2  General: WN WM who is alert and generally healthy appearing.  HEENT: Normal. Pupils equal. Neck: Supple. No mass.  No thyroid mass. Lymph Nodes:  No supraclavicular or cervical nodes. Lungs: Clear to auscultation and symmetric breath sounds. Heart:  RRR. No murmur or rub.  Abdomen: Soft. No mass. No tenderness.  Normal bowel sounds.  No abdominal scars.  Bilateral inguinal hernias - R>L.  Both are reducible. Rectal: Not done. Extremities:  Good strength and ROM  in upper and lower  extremities. Neurologic:  Grossly intact to motor and sensory function. Psychiatric: Has normal mood and affect. Behavior is normal.   DATA REVIEWED: Notes from Dr. Todd.  Brenda Cowher, MD,  [facility] [address]  [address]   [place], [place]    [address] Phone:  [phone] FAX:  [phone]  

## 2012-02-24 ENCOUNTER — Encounter (HOSPITAL_COMMUNITY): Payer: Self-pay | Admitting: Surgery

## 2012-02-24 NOTE — Op Note (Signed)
NAME:  NYLES, MITTON                 ACCOUNT NO.:  [id number]  MEDICAL RECORD NO.:  [id number]  LOCATION:  [facility]                         FACILITY:  [facility]  PHYSICIAN:  Sandria Bales. Ezzard Standing, M.D.  DATE OF BIRTH:  01-28-1976  DATE OF PROCEDURE:  02/23/2012                              OPERATIVE REPORT   PREOPERATIVE DIAGNOSIS:  Bilateral inguinal hernias, right larger than left.  POSTOPERATIVE DIAGNOSIS:  Bilateral direct inguinal hernias, right larger than left.  PROCEDURE:  Laparoscopic bilateral inguinal hernia repair using precut Atrium mesh.  SURGEON:  Sandria Bales. Ezzard Standing, M.D.  FIRST ASSISTANT:  None.  ANESTHESIA:  General endotracheal.  ESTIMATED BLOOD LOSS:  Minimal.  INDICATIONS FOR PROCEDURE:  Mr. Mcglinn is a 37 year old white male who sees Dr. Alonza Smoker as his primary medical doctor.  He has had bilateral inguinal hernias noted about couple of years, but this had been asymptomatic recently.  When they gotten larger and caused more pain.  He now comes for repair of these hernias.  I have discussed with him both laparoscopic and open hernia repairs, but I think because he has got bilateral hernias and had no prior abdominal surgery, is a good candidate for laparoscopic surgery.  Potential risks for hernia surgery include, but are not limited to, bleeding, infection, nerve injury, recurrence of the hernia.  OPERATIVE NOTE:  The patient was placed in room #1 at [facility], had both arms tucked.  Foley catheter in place, given 2 g of Ancef at the initiation of procedure.  A time-out was held and the surgical checklist run.  I accessed his preperitoneal space by going through an infraumbilical incision.  I went to the right of midline and divided the the anterior rectus abdominis fascia.  I then retracted the rectus muscle anteriorly and placed the preperitoneal balloon into the preperitoneal space and insufflated this.  I visualized the pubic bone and both inferior epigastric  vessels in the preperitoneal space.  I then removed the balloon and placed two 5-mm trocars, one in the right lower quadrant and one in the left lower quadrant.  I dissected out this preperitoneal space.  I identified on the right side a sort of fatty large hernia, was mainly had preperitoneal fat in it.  Then, I was able to reduce the hernias.  I identified the cord structures, I saw no evidence of an indirect inguinal hernia and stripped down the peritoneum to the base of the cord structures of the left side.  He had a similar type of hernia and sort of lateral tumor with a lot of preperitoneal fat, which I reduced out.  Again, he had no evidence of an indirect hernia on the left side.  I then encircled the cord structures.  I used a precut C Qur Lite Atrium mesh on both sides.  It was up at the right side first and left side second.  I placed the mesh, lay flat against the inguinal floor and around the cord structures.  I used the Protac on the right and left with 9 Protacs on the right, 12 on the left though, some of these were overlapped in the midline.  Photos were taken  and placed in the chart.  I then deflated the abdomen.  Once make sure there were no areas, which were rolled up, which they did not.  So I thought the mesh lay flat, I had both inguinal floors covered well and it isolated the cord structures on both sides.  The preperitoneal space did limit the exposure.  I then removed the trocars in turn.  There was no bleeding at trocar site.  The anterior fascia was closed with 0 Vicryl suture. Subcutaneous tissue was closed with 3-0 Vicryl suture and the skin closed with 5-0 Monocryl suture and painted with Dermabond.  The patient was transported to the recovery room in good condition. Sponge and needle count were correct at the end of the case.   Sandria Bales. Ezzard Standing, M.D., FACS   DHN/MEDQ  D:  02/23/2012  T:  02/24/2012  Job:  [id number]  cc:   Tinnie Gens A. Tawanna Cooler, MD [address]  [place] [address]  Petra Kuba, M.D. Fax: [phone]

## 2012-02-29 ENCOUNTER — Telehealth (INDEPENDENT_AMBULATORY_CARE_PROVIDER_SITE_OTHER): Payer: Self-pay

## 2012-02-29 NOTE — Telephone Encounter (Signed)
Percocet 5/325mg  po q6hrs Prn Pain wriitten by Dr. Biagio Quint Patient  is aware he needs to pick up RX

## 2012-03-09 ENCOUNTER — Ambulatory Visit (INDEPENDENT_AMBULATORY_CARE_PROVIDER_SITE_OTHER): Payer: 59 | Admitting: Surgery

## 2012-03-09 ENCOUNTER — Encounter (INDEPENDENT_AMBULATORY_CARE_PROVIDER_SITE_OTHER): Payer: Self-pay | Admitting: Surgery

## 2012-03-09 VITALS — BP 142/98 | HR 100 | Temp 98.3°F | Resp 18 | Ht 70.0 in | Wt 176.4 lb

## 2012-03-09 DIAGNOSIS — K4020 Bilateral inguinal hernia, without obstruction or gangrene, not specified as recurrent: Secondary | ICD-10-CM

## 2012-03-09 NOTE — Progress Notes (Signed)
[facility]  Ovidio Kin, MD,  FACS [address] [place]  [address] [place], [place]    [address] Phone:  [phone] FAX:  [phone]   Re:   Thomas Williamson DOB:   1975/10/03 MRN:   [id number]  ASSESSMENT AND PLAN: 1.  Post lap bilateral laparoscopic inguinal hernia repair - 02/23/2012 - D. [facility]  Doing well.  Looks good.  Return appt PRN.  2. Hypertension x 2 years  3. Bipolar depression  Followed by Dr. Emerson Monte  4. Irritable bowel syndrome  Has seen Dr. Judie Petit. Mag[place]  5. Hypercholesterolemia 6.  He's a little under the weather and I wrote him a note to be out of work for a day or two.   HISTORY OF PRESENT ILLNESS: Chief Complaint  Patient presents with  . Routine Post Op    bil hernia repair    Thomas Williamson is a 37 y.o. (DOB: 25-Apr-1975)  white male who is a patient of TODD,JEFFREY ALLEN, MD and comes to me today for follow up of bilateral inguinal hernia repair.  He is doing well and his post op pain has resolved.  He has some other bug that has him feeling under the weather.   PHYSICAL EXAM: BP 142/98  Pulse 100  Temp 98.3 F (36.8 C) (Temporal)  Resp 18  Ht 5\' 10"  (1.778 m)  Wt 176 lb 6.4 oz (80.015 kg)  BMI 25.31 kg/m2  Abdomen:  Incision look good.  No swelling.  DATA REVIEWED: None new.  Ovidio Kin, MD, FACS Office:  [phone]

## 2012-03-22 ENCOUNTER — Other Ambulatory Visit: Payer: Self-pay | Admitting: *Deleted

## 2012-03-22 MED ORDER — LOSARTAN POTASSIUM-HCTZ 50-12.5 MG PO TABS: 1.0000 | ORAL_TABLET | Freq: Every evening | ORAL | Status: DC

## 2012-08-11 ENCOUNTER — Telehealth: Payer: Self-pay | Admitting: Family Medicine

## 2012-08-11 NOTE — Telephone Encounter (Signed)
Spoke with patient and he has been walking on his ankle for a couple of days and is okay to wait until tomorrow

## 2012-08-11 NOTE — Telephone Encounter (Signed)
noted 

## 2012-08-11 NOTE — Telephone Encounter (Signed)
Patient Information:  Caller Name: Jaimes  Phone: [phone]  Patient: Thomas Williamson, Thomas Williamson  Gender: Male  DOB: 06/19/1975  Age: 37 Years  PCP: Kelle Darting [facility])  Office Follow Up:  Does the office need to follow up with this patient?: Yes  Instructions For The Office: NEED SAME DAY APPT W/ DR TODD, NO AVAILBILITY  RN Note:  Pt rolled his Ankle while walking in parking lot at his home, Pt tripped over grade in ground.  Ankle is swollen, however, Pt can walk, denies pain.  Swelling is size of Pt's palm. PLEASE CALL PT BACK W/ SAME DAY APPT, NO AVAILABLITY W/ DR TODD REMAINING.   Symptoms  Reason For Call & Symptoms: Ankle Injury  Reviewed Health History In EMR: Yes  Reviewed Medications In EMR: Yes  Reviewed Allergies In EMR: Yes  Reviewed Surgeries / Procedures: Yes  Date of Onset of Symptoms: 08/06/2012  Treatments Tried: Ibuprofen  Treatments Tried Worked: No  Guideline(s) Used:  Ankle Pain  Ankle and Foot Injury  Disposition Per Guideline:   See Today in Office  Reason For Disposition Reached:   Large swelling or bruise and size > palm of person's hand  Advice Given:  N/A  Patient Will Follow Care Advice:  YES

## 2012-08-11 NOTE — Telephone Encounter (Signed)
Attempted to return call to patient regarding ankle injury 08/06/12.  Left 2 voice mail messages to call office if further assistance needed.  No contact made.

## 2012-08-12 ENCOUNTER — Ambulatory Visit (INDEPENDENT_AMBULATORY_CARE_PROVIDER_SITE_OTHER): Payer: BC Managed Care – PPO | Admitting: Family

## 2012-08-12 ENCOUNTER — Ambulatory Visit (INDEPENDENT_AMBULATORY_CARE_PROVIDER_SITE_OTHER)
Admission: RE | Admit: 2012-08-12 | Discharge: 2012-08-12 | Disposition: A | Discharge status: Home / Self Care | Payer: BC Managed Care – PPO | Source: Ambulatory Visit | Attending: Family | Admitting: Family

## 2012-08-12 ENCOUNTER — Encounter: Payer: Self-pay | Admitting: Family

## 2012-08-12 VITALS — BP 136/100 | HR 100 | Wt 183.0 lb

## 2012-08-12 DIAGNOSIS — S99912A Unspecified injury of left ankle, initial encounter: Secondary | ICD-10-CM

## 2012-08-12 DIAGNOSIS — S8990XA Unspecified injury of unspecified lower leg, initial encounter: Secondary | ICD-10-CM

## 2012-08-12 DIAGNOSIS — S99919A Unspecified injury of unspecified ankle, initial encounter: Secondary | ICD-10-CM

## 2012-08-12 MED ORDER — OXYCODONE-ACETAMINOPHEN 5-325 MG PO TABS: 1.0000 | ORAL_TABLET | Freq: Three times a day (TID) | ORAL | Status: DC | PRN

## 2012-08-12 NOTE — Patient Instructions (Addendum)

## 2012-08-12 NOTE — Progress Notes (Signed)
Subjective:    Patient ID: Thomas Williamson, male    DOB: [birth year], 37 y.o.   MRN: [id number]  HPI 37 year old white male, nonsmoker, patient of Dr. Tawanna Cooler is in today with complaints of left ankle pain after rolling his ankle over 6 days ago. Patient reports he stepped down off a curb into her storm drain while walking downtown and everted his ankle. Continues to have bruising, swelling, and pain. Pain is 7/10, describes as achy. Worse in the mornings. Patient reports he continues to walk with the left but able to bear weight. Has been taken anti-inflammatory medications and using ice with no relief.   Review of Systems  Constitutional: Negative.   Respiratory: Negative.   Cardiovascular: Negative.   Gastrointestinal: Negative.   Musculoskeletal: Negative for back pain.       Left ankle pain, bruising, and swelling  Skin: Negative.   Allergic/Immunologic: Negative.   Neurological: Negative.   Psychiatric/Behavioral: Negative.    Past Medical History  Diagnosis Date  . Allergy   . GERD (gastroesophageal reflux disease)   . IBS (irritable bowel syndrome)   . Hypertension   . Anxiety   . PONV (postoperative nausea and vomiting)     History   Social History  . Marital Status: Single    Spouse Name: N/A    Number of Children: N/A  . Years of Education: N/A   Occupational History  . Not on file.   Social History Main Topics  . Smoking status: Never Smoker   . Smokeless tobacco: Never Used  . Alcohol Use: Yes     Comment: Social  . Drug Use: No  . Sexually Active:    Other Topics Concern  . Not on file   Social History Narrative  . No narrative on file    Past Surgical History  Procedure Laterality Date  . Anterior cruciate ligament repair  2010  . Nose surgery  4098,[id number]  . Inguinal hernia repair  02/23/2012    Procedure: LAPAROSCOPIC BILATERAL INGUINAL HERNIA REPAIR;  Surgeon: Kandis Cocking, MD;  Location: [facility];  Service: General;  Laterality: Bilateral;   Laparoscopic Bilateral Inguinal Hernia Repair with mesh  . Insertion of mesh  02/23/2012    Procedure: INSERTION OF MESH;  Surgeon: Kandis Cocking, MD;  Location: [facility];  Service: General;  Laterality: N/A;    Family History  Problem Relation Age of Onset  . Hypertension Mother   . Hypertension Father     Allergies  Allergen Reactions  . Hydrocodone     UPSETS STOMACH    Current Outpatient Prescriptions on File Prior to Visit  Medication Sig Dispense Refill  . ALPRAZolam (XANAX) 0.5 MG tablet Take 0.5 mg by mouth as needed. anxiety      . lamoTRIgine (LAMICTAL) 200 MG tablet Take 200 mg by mouth every evening.       Marland Kitchen losartan-hydrochlorothiazide (HYZAAR) 50-12.5 MG per tablet Take 1 tablet by mouth every evening.  90 tablet  1  . omeprazole (PRILOSEC) 20 MG capsule Take 20 mg by mouth daily.      . citalopram (CELEXA) 20 MG tablet Take 20 mg by mouth daily with lunch.       . doxycycline (VIBRA-TABS) 100 MG tablet        No current facility-administered medications on file prior to visit.    BP 136/100  Pulse 100  Wt 183 lb (83.008 kg)  BMI 26.26 kg/m2  SpO2 98%chart    Objective:  Physical Exam  Constitutional: He is oriented to person, place, and time. He appears well-developed and well-nourished.  Neck: Normal range of motion. Neck supple.  Cardiovascular: Normal rate and regular rhythm.   Pulmonary/Chest: Breath sounds normal.  Abdominal: Soft.  Musculoskeletal: Normal range of motion.  Left ankle: Moderately swollen, bruising in the area stages. Pain with flexion and eversion. Minimal pain with inversion. Pedal pulses 2 out of 2. Pinpoint tenderness over the distal tibia  Neurological: He is alert and oriented to person, place, and time.  Skin: Skin is warm and dry.  Psychiatric: He has a normal mood and affect.          Assessment & Plan:  Assessment: 1. Left ankle sprain 2. Left ankle pain  Plan: Sprain instructions given. We'll x-ray the left ankle  to be sure he does not have a fracture. Elevate the leg, continue ice, continue ibuprofen. Oxycodone as needed for extreme pain. Warned of drowsiness. Recheck a schedule in the as needed

## 2012-08-16 ENCOUNTER — Telehealth: Payer: Self-pay | Admitting: Family Medicine

## 2012-08-16 NOTE — Telephone Encounter (Signed)
Pt saw Padonda on 7/11 for an ankle injury. It is not fx'd but he is still in a lot of pain. He will be out of pain med after tomorrow. He is not able to stay off it, as he is gong to job interviews. He would like a refill.new rx of pain med if possible. Padonda rx'd him oxyCODONE-acetaminophen (ROXICET) 5-325 MG per tablet Please advise and call pt.  He states that these pills were supposed to be 1 Q 8 hrs. He says he has to take 1 every 4 hrs to keep pain under control, but pain is still there.

## 2012-08-16 NOTE — Telephone Encounter (Signed)
Left message on machine for patient for patient

## 2012-08-16 NOTE — Telephone Encounter (Signed)
Since he has not responded to conservative therapy he'll need to call Dr. Cleophas Dunker and Dr. Cleophas Dunker and they will see him tomorrow

## 2012-08-17 ENCOUNTER — Telehealth: Payer: Self-pay | Admitting: Family Medicine

## 2012-08-17 NOTE — Telephone Encounter (Signed)
Reon [phone]; Reason for Call: Due to continued swelling on ankle, patient had called 08/16/12 for refill/increase in pain med, since this is the same injury as when he was seen. Advised per EPIC: " Roderick Pee, MD at 08/16/2012 5:15 PM   Status: Signed            Since he has not responded to conservative therapy he'll need to call Dr. Cleophas Dunker and Dr. Cleophas Dunker and they will see him tomorrow    Patient given name of office of [facility], [phone], (per Fleet Contras) states he is between jobs without insurance. Unhappy office cannot offer him more pain med.

## 2012-09-28 ENCOUNTER — Other Ambulatory Visit: Payer: Self-pay | Admitting: Family Medicine

## 2012-11-02 ENCOUNTER — Ambulatory Visit (INDEPENDENT_AMBULATORY_CARE_PROVIDER_SITE_OTHER): Payer: BC Managed Care – PPO | Admitting: Internal Medicine

## 2012-11-02 ENCOUNTER — Encounter: Payer: Self-pay | Admitting: Internal Medicine

## 2012-11-02 VITALS — BP 155/100 | HR 105 | Temp 97.8°F | Wt 183.0 lb

## 2012-11-02 DIAGNOSIS — I10 Essential (primary) hypertension: Secondary | ICD-10-CM

## 2012-11-02 DIAGNOSIS — K122 Cellulitis and abscess of mouth: Secondary | ICD-10-CM

## 2012-11-02 MED ORDER — AMOXICILLIN-POT CLAVULANATE 875-125 MG PO TABS: 1.0000 | ORAL_TABLET | Freq: Two times a day (BID) | ORAL | Status: DC

## 2012-11-02 NOTE — Progress Notes (Signed)
Chief Complaint  Patient presents with  . Sore Throat    Started 4 days ago.  Hard to swallow.  . Sinusitis    HPI: Patient comes in today for SDA for  new problem evaluation. Pt dr TODD  Onset 5 days of quick onset of very sore throat higher up than usual  Then intense sorethroat  no exposure to strep did have it when he was younger has a history of sinus problem and drainage along with nose surgery. This feels different minor cough and runny nose and if at all  Warm liquids feel okay but is very painful.  Uvula feels swollen and tender   No fever.     Hasn't had something that feels just like this.  Blood pressure has been checked in about a month but had been high normal in the 130s. No excessive alcohol or decongestants at this time. Taking his blood pressure medication. ROS: See pertinent positives and negatives per HPI. Uses occasional alcohol denies any specific injury to the back of his throat although a week ago did use marijuana states he could have gotten smashed in the back of his throat but doesn't remember any discomfort at all.  Past Medical History  Diagnosis Date  . Allergy   . GERD (gastroesophageal reflux disease)   . IBS (irritable bowel syndrome)   . Hypertension   . Anxiety   . PONV (postoperative nausea and vomiting)     Family History  Problem Relation Age of Onset  . Hypertension Mother   . Hypertension Father     History   Social History  . Marital Status: Single    Spouse Name: N/A    Number of Children: N/A  . Years of Education: N/A   Social History Main Topics  . Smoking status: Never Smoker   . Smokeless tobacco: Never Used  . Alcohol Use: Yes     Comment: Social  . Drug Use: No  . Sexual Activity:    Other Topics Concern  . None   Social History Narrative  . None    Outpatient Encounter Prescriptions as of 11/02/2012  Medication Sig Dispense Refill  . ALPRAZolam (XANAX) 0.5 MG tablet Take 0.5 mg by mouth as needed. anxiety       . lamoTRIgine (LAMICTAL) 200 MG tablet Take 200 mg by mouth every evening.       Marland Kitchen losartan-hydrochlorothiazide (HYZAAR) 50-12.5 MG per tablet TAKE 1 TABLET BY MOUTH EVERY EVENING.  90 tablet  1  . omeprazole (PRILOSEC) 20 MG capsule Take 20 mg by mouth daily.      . sertraline (ZOLOFT) 50 MG tablet Take 50 mg by mouth daily.      Marland Kitchen amoxicillin-clavulanate (AUGMENTIN) 875-125 MG per tablet Take 1 tablet by mouth every 12 (twelve) hours.  20 tablet  0  . [DISCONTINUED] citalopram (CELEXA) 20 MG tablet Take 20 mg by mouth daily with lunch.       . [DISCONTINUED] doxycycline (VIBRA-TABS) 100 MG tablet       . [DISCONTINUED] oxyCODONE-acetaminophen (ROXICET) 5-325 MG per tablet Take 1 tablet by mouth every 8 (eight) hours as needed for pain.  20 tablet  0   No facility-administered encounter medications on file as of 11/02/2012.    EXAM:  BP 155/100  Pulse 105  Temp(Src) 97.8 F (36.6 C) (Oral)  Wt 183 lb (83.008 kg)  BMI 26.26 kg/m2  SpO2 98%  Body mass index is 26.26 kg/(m^2).  GENERAL: vitals reviewed and listed  above, alert, oriented, appears well hydrated and in no acute distress looks a bit tired nontoxic normal voice to be slightly hoarse  HEENT: atraumatic, conjunctiva  clear, no obvious abnormalities on inspection of external nose and ears OP : Airway is patent uvula is swollen +2 with possible exudate in the posterior pharynx with slight exudate but no edema NECK: no obvious masses on inspection palpation no significant adenopathy shoddy nodes Repeat blood pressure 155/100 right arm LUNGS: clear to auscultation bilaterally, no wheezes, rales or rhonchi, good air movement  CV: HRRR, no clubbing cyanosis or  peripheral edema nl cap refill   MS: moves all extremities without noticeable focal  abnormality  PSYCH: pleasant and cooperative, no obvious depression or anxiety  ASSESSMENT AND PLAN:  Discussed the following assessment and plan:  Uvulitis - Plan: POC Rapid  Strep A, Throat culture ([facility])  Hypertension Uncertain cause empiric treatment for bacterial infection although I suppose could be traumatic chemical from MJ  But not classic history for such discussed symptomatic treatment pending strep test. Increase blood pressure medication if confirmed elevated at home and followup with Dr. Tawanna Cooler.  -Patient advised to return or notify health care team  if symptoms worsen or persist or new concerns arise.  Patient Instructions  Uncertain cause  But pending culture tess. Treat for bacterial uvula infection Salt water gargles    Ibuprofen    Add antibiotic  For now.   Check your bp readings at home over the next week or so   If not at goal below 140/90   Then take   2  Of the blood pressure  Medication  Per day  In the short run and  Plan fu with  Dr.  Tawanna Cooler.  Repeat  150 /100 BP.    Uvulitis Uvulitis is redness and soreness (inflammation) of the uvula. The uvula is the small tongue-shaped piece of tissue in the back of your mouth.  CAUSES Infection is a common cause of uvulitis. Infection of the uvula can be either viral or bacterial. Infectious uvulitis usually only occurs in association with another condition, such as inflammation and infection of the mouth or throat. Other causes of uvulitis include:  Trauma to the uvula.  Swelling from excess fluid buildup (edema), which may be an allergic reaction.  Inhalation of irritants, such as chemical agents, smoke, or steam. DIAGNOSIS Your caregiver can usually diagnose uvulitis through a physical examination. Bacterial uvulitis can be diagnosed through the results of the growth of samples of bodily substances taken from your mouth (cultures). HOME CARE INSTRUCTIONS   Rest as much as possible.  Young children may suck on frozen juice bars or frozen ice pops. Older children and adults may gargle with a warm or cold liquid to help soothe the throat. (Mix  tsp of salt in 8 oz of water, or use strong  tea.)  Use a cool-mist humidifier to lessen throat irritation and cough.  Drink enough fluids to keep your urine clear or pale yellow.  While the throat is very sore, eat soft or liquid foods such as milk, ice cream, soups, or milk drinks.  Family members who develop a sore throat or fever should have a medical exam or throat culture.  If your child has uvulitis and is taking antibiotic medicine, wait 24 hours or until his or her temperature is near normal (less than 100 F [37.8 C]) before allowing him or her to return to school or day care.  Only take over-the-counter or prescription  medicines for pain, discomfort, or fever as directed by your caregiver. Ask when your test results will be ready. Make sure you get your test results. SEEK MEDICAL CARE IF:   You have an oral temperature above 102 F (38.9 C).  You develop large, tender lumps your the neck.  Your child develops a rash.  You cough up green, yellow-brown, or bloody substances. SEEK IMMEDIATE MEDICAL CARE IF:   You develop any new symptoms, such as vomiting, earache, severe headache, stiff neck, chest pain, or trouble breathing or swallowing.  Your airway is blocked.  You develop more severe throat pain along with drooling or voice changes. Document Released: 08/30/2003 Document Revised: 04/13/2011 Document Reviewed: 03/27/2010 [facility] Patient Information 2014 [place], [place].      Neta Mends. Panosh M.D.

## 2012-11-02 NOTE — Patient Instructions (Addendum)
Uncertain cause  But pending culture tess. Treat for bacterial uvula infection Salt water gargles    Ibuprofen    Add antibiotic  For now.   Check your bp readings at home over the next week or so   If not at goal below 140/90   Then take   2  Of the blood pressure  Medication  Per day  In the short run and  Plan fu with  Dr.  Tawanna Cooler.  Repeat  150 /100 BP.    Uvulitis Uvulitis is redness and soreness (inflammation) of the uvula. The uvula is the small tongue-shaped piece of tissue in the back of your mouth.  CAUSES Infection is a common cause of uvulitis. Infection of the uvula can be either viral or bacterial. Infectious uvulitis usually only occurs in association with another condition, such as inflammation and infection of the mouth or throat. Other causes of uvulitis include:  Trauma to the uvula.  Swelling from excess fluid buildup (edema), which may be an allergic reaction.  Inhalation of irritants, such as chemical agents, smoke, or steam. DIAGNOSIS Your caregiver can usually diagnose uvulitis through a physical examination. Bacterial uvulitis can be diagnosed through the results of the growth of samples of bodily substances taken from your mouth (cultures). HOME CARE INSTRUCTIONS   Rest as much as possible.  Young children may suck on frozen juice bars or frozen ice pops. Older children and adults may gargle with a warm or cold liquid to help soothe the throat. (Mix  tsp of salt in 8 oz of water, or use strong tea.)  Use a cool-mist humidifier to lessen throat irritation and cough.  Drink enough fluids to keep your urine clear or pale yellow.  While the throat is very sore, eat soft or liquid foods such as milk, ice cream, soups, or milk drinks.  Family members who develop a sore throat or fever should have a medical exam or throat culture.  If your child has uvulitis and is taking antibiotic medicine, wait 24 hours or until his or her temperature is near normal (less than  100 F [37.8 C]) before allowing him or her to return to school or day care.  Only take over-the-counter or prescription medicines for pain, discomfort, or fever as directed by your caregiver. Ask when your test results will be ready. Make sure you get your test results. SEEK MEDICAL CARE IF:   You have an oral temperature above 102 F (38.9 C).  You develop large, tender lumps your the neck.  Your child develops a rash.  You cough up green, yellow-brown, or bloody substances. SEEK IMMEDIATE MEDICAL CARE IF:   You develop any new symptoms, such as vomiting, earache, severe headache, stiff neck, chest pain, or trouble breathing or swallowing.  Your airway is blocked.  You develop more severe throat pain along with drooling or voice changes. Document Released: 08/30/2003 Document Revised: 04/13/2011 Document Reviewed: 03/27/2010 [facility] Patient Information 2014 [place], [place].

## 2012-11-04 LAB — CULTURE, GROUP A STREP

## 2012-11-11 ENCOUNTER — Telehealth: Payer: Self-pay | Admitting: Family Medicine

## 2012-11-11 NOTE — Telephone Encounter (Signed)
Pt states that upon seeing Dr. Fabian Sharp, and realizing that his BP was elevated, Dr. Fabian Sharp recommended doubling his dosage of BP medication. He would like to have Dr. Nelida Meuse permission prior to doing so. Please advise.

## 2012-11-14 NOTE — Telephone Encounter (Signed)
Per Dr Tawanna Cooler, agrees with double on BP medication.  He should also keep a 30 day record of blood pressure readings and come in for an office visit if no improvement.  Patient agrees.

## 2013-01-29 ENCOUNTER — Other Ambulatory Visit: Payer: Self-pay | Admitting: Family Medicine

## 2013-02-01 ENCOUNTER — Telehealth: Payer: Self-pay | Admitting: Family Medicine

## 2013-02-01 MED ORDER — LOSARTAN POTASSIUM-HCTZ 50-12.5 MG PO TABS: ORAL_TABLET | ORAL | Status: DC

## 2013-02-01 NOTE — Telephone Encounter (Signed)
Fleet Contras it's give him the Hyzaar 100-25 dispense 100 tabs directions 1 tab daily in the morning for high blood pressure no refills because he needs to come in for a CPX in January or February

## 2013-02-01 NOTE — Telephone Encounter (Signed)
Rx sent 

## 2013-02-01 NOTE — Telephone Encounter (Signed)
Patient called requesting a refill on losartan-hydrochlorothiazide [facility]) Stated he was told to double up to get his blood pressure down and now he has ran out Would like his prescription doubled Please advise

## 2013-09-02 ENCOUNTER — Other Ambulatory Visit: Payer: Self-pay | Admitting: Family Medicine

## 2013-09-11 ENCOUNTER — Telehealth: Payer: Self-pay | Admitting: Family Medicine

## 2013-09-11 NOTE — Telephone Encounter (Signed)
Appointment made

## 2013-09-11 NOTE — Telephone Encounter (Signed)
Pt has blood in stool, 2-3 days, towards the end of the stool, light blood in toilet and blood on tissue.

## 2013-09-12 ENCOUNTER — Ambulatory Visit (INDEPENDENT_AMBULATORY_CARE_PROVIDER_SITE_OTHER): Payer: 59 | Admitting: Family Medicine

## 2013-09-12 ENCOUNTER — Encounter: Payer: Self-pay | Admitting: Family Medicine

## 2013-09-12 VITALS — BP 130/108 | Temp 97.9°F | Wt 182.0 lb

## 2013-09-12 DIAGNOSIS — K648 Other hemorrhoids: Secondary | ICD-10-CM

## 2013-09-12 HISTORY — DX: Other hemorrhoids: K64.8

## 2013-09-12 MED ORDER — STARCH 51 % RE SUPP: 1.0000 | RECTAL | Status: DC | PRN

## 2013-09-12 NOTE — Patient Instructions (Signed)
Drink lots of water  Stool softener,,,,,,,,,,,,,,, milk of magnesia 3 tablespoons twice daily  Medicated suppositories,,,,,,,,,, 1 rectally x10 days  If the bleeding persists after 10 days call your gastroenterologist for further consultation

## 2013-09-12 NOTE — Progress Notes (Signed)
Pre visit review using our clinic review tool, if applicable. No additional management support is needed unless otherwise documented below in the visit note. 

## 2013-09-12 NOTE — Progress Notes (Signed)
   Subjective:    Patient ID: Thomas Williamson, male    DOB: [birth year], 38 y.o.   MRN: [id number]  HPI Thomas Williamson is a 38 year old male nonsmoker who comes in today for evaluation of bright red rectal bleeding painless for 2 days  Had a normal bowel movement on Sunday morning and noticed some blood when he wiped. Yesterday he noticed blood in the toilet. It's painless. No fever chills nausea vomiting diarrhea or constipation. He does have a history of IBS but no recent constipation. He's had a colonoscopy in the past by Dr. Posey Thomas Williamson because of abdominal pain. Workup showed IBS one polyp which was removed.  No family history of colon cancer is   Review of Systems Review of systems otherwise negative    Objective:   Physical Exam  Well-developed well nourished male no acute distress examination of the rectum appears normal no external hemorrhoids. Digital rectal exam shows a normal prostate exam 6 no palpable masses guaiac-negative      Assessment & Plan:  Bleeding internal hemorrhoid,,,,,,,,,,,,

## 2013-09-13 ENCOUNTER — Telehealth: Payer: Self-pay | Admitting: Family Medicine

## 2013-09-13 NOTE — Telephone Encounter (Signed)
The rx starch (ANUSOL) 51 % suppository (tucks) Are no longer made. Pharm would like to know what you would have them do?

## 2013-09-14 MED ORDER — HYDROCORTISONE ACE-PRAMOXINE 1-1 % RE FOAM
1.0000 | Freq: Two times a day (BID) | RECTAL | Status: DC
Start: 2013-09-14 — End: 2014-06-26

## 2013-09-14 NOTE — Telephone Encounter (Signed)
Spoke with pharmacy and new Rx sent.

## 2013-09-20 ENCOUNTER — Other Ambulatory Visit (INDEPENDENT_AMBULATORY_CARE_PROVIDER_SITE_OTHER): Payer: 59

## 2013-09-20 DIAGNOSIS — Z0000 Encounter for general adult medical examination without abnormal findings: Secondary | ICD-10-CM

## 2013-09-20 LAB — CBC WITH DIFFERENTIAL/PLATELET
BASOS ABS: 0.1 10*3/uL (ref 0.0–0.1)
Basophils Relative: 0.7 % (ref 0.0–3.0)
Eosinophils Absolute: 0.2 10*3/uL (ref 0.0–0.7)
Eosinophils Relative: 2.2 % (ref 0.0–5.0)
HCT: 38.4 % — ABNORMAL LOW (ref 39.0–52.0)
HEMOGLOBIN: 13 g/dL (ref 13.0–17.0)
LYMPHS ABS: 2.7 10*3/uL (ref 0.7–4.0)
Lymphocytes Relative: 36.4 % (ref 12.0–46.0)
MCHC: 33.8 g/dL (ref 30.0–36.0)
MCV: 95.8 fl (ref 78.0–100.0)
MONO ABS: 0.6 10*3/uL (ref 0.1–1.0)
Monocytes Relative: 7.7 % (ref 3.0–12.0)
NEUTROS ABS: 3.9 10*3/uL (ref 1.4–7.7)
Neutrophils Relative %: 53 % (ref 43.0–77.0)
Platelets: 294 10*3/uL (ref 150.0–400.0)
RBC: 4.01 Mil/uL — ABNORMAL LOW (ref 4.22–5.81)
RDW: 13.1 % (ref 11.5–15.5)
WBC: 7.4 10*3/uL (ref 4.0–10.5)

## 2013-09-20 LAB — BASIC METABOLIC PANEL
BUN: 13 mg/dL (ref 6–23)
CALCIUM: 9.1 mg/dL (ref 8.4–10.5)
CO2: 27 mEq/L (ref 19–32)
CREATININE: 0.8 mg/dL (ref 0.4–1.5)
Chloride: 102 mEq/L (ref 96–112)
GFR: 123.66 mL/min (ref >60.00)
Glucose, Bld: 84 mg/dL (ref 70–99)
Potassium: 3.7 mEq/L (ref 3.5–5.1)
Sodium: 138 mEq/L (ref 135–145)

## 2013-09-20 LAB — POCT URINALYSIS DIPSTICK
Blood, UA: NEGATIVE
Glucose, UA: NEGATIVE
LEUKOCYTES UA: NEGATIVE
NITRITE UA: NEGATIVE
Spec Grav, UA: 1.025
UROBILINOGEN UA: 0.2
pH, UA: 5.5

## 2013-09-20 LAB — HEPATIC FUNCTION PANEL
ALK PHOS: 60 U/L (ref 39–117)
ALT: 25 U/L (ref 0–53)
AST: 23 U/L (ref 0–37)
Albumin: 4 g/dL (ref 3.5–5.2)
Bilirubin, Direct: 0.1 mg/dL (ref 0.0–0.3)
TOTAL PROTEIN: 7.5 g/dL (ref 6.0–8.3)
Total Bilirubin: 0.5 mg/dL (ref 0.2–1.2)

## 2013-09-20 LAB — LIPID PANEL
CHOL/HDL RATIO: 7
CHOLESTEROL: 239 mg/dL — AB (ref 0–200)
HDL: 35.6 mg/dL — ABNORMAL LOW (ref >39.00)
NONHDL: 203.4
Triglycerides: 302 mg/dL — ABNORMAL HIGH (ref 0.0–149.0)
VLDL: 60.4 mg/dL — ABNORMAL HIGH (ref 0.0–40.0)

## 2013-09-20 LAB — TSH: TSH: 0.48 u[IU]/mL (ref 0.35–4.50)

## 2013-09-20 LAB — LDL CHOLESTEROL, DIRECT: LDL DIRECT: 172.5 mg/dL

## 2013-09-25 ENCOUNTER — Encounter: Payer: BC Managed Care – PPO | Admitting: Family Medicine

## 2013-09-29 ENCOUNTER — Encounter: Payer: Self-pay | Admitting: Family Medicine

## 2013-09-29 ENCOUNTER — Ambulatory Visit (INDEPENDENT_AMBULATORY_CARE_PROVIDER_SITE_OTHER): Payer: 59 | Admitting: Family Medicine

## 2013-09-29 VITALS — BP 124/88 | Temp 97.7°F | Ht 69.5 in | Wt 182.0 lb

## 2013-09-29 DIAGNOSIS — E789 Disorder of lipoprotein metabolism, unspecified: Secondary | ICD-10-CM

## 2013-09-29 DIAGNOSIS — K589 Irritable bowel syndrome without diarrhea: Secondary | ICD-10-CM

## 2013-09-29 DIAGNOSIS — E7889 Other lipoprotein metabolism disorders: Secondary | ICD-10-CM

## 2013-09-29 DIAGNOSIS — Z23 Encounter for immunization: Secondary | ICD-10-CM

## 2013-09-29 DIAGNOSIS — I10 Essential (primary) hypertension: Secondary | ICD-10-CM

## 2013-09-29 DIAGNOSIS — F319 Bipolar disorder, unspecified: Secondary | ICD-10-CM

## 2013-09-29 DIAGNOSIS — F3130 Bipolar disorder, current episode depressed, mild or moderate severity, unspecified: Secondary | ICD-10-CM

## 2013-09-29 HISTORY — DX: Other lipoprotein metabolism disorders: E78.89

## 2013-09-29 MED ORDER — LOSARTAN POTASSIUM-HCTZ 50-12.5 MG PO TABS: ORAL_TABLET | ORAL | Status: DC

## 2013-09-29 NOTE — Patient Instructions (Signed)
Continue your blood pressure medication  Continue your exercise and good health habits  Return in one year for general physical examination sooner if any problem

## 2013-09-29 NOTE — Progress Notes (Signed)
   Subjective:    Patient ID: Thomas Williamson, male    DOB: May 17, 1975, 38 y.o.   MRN: [id number]  HPI Erikson is a 38 year old male who comes in today for general physical examination because of a history of hypertension  He takes Hyzaar 50/12.5 daily for hypertension we will BP 124/88  He gets routine eye care because he has contacts, regular dental care, GI followup by Dr. Posey Rea. He's been diagnosed with IBS colonoscopy also showed a polyp.  Father was diagnosed at age 13 with prostate cancer  Vaccinations updated by Apolonio Schneiders   Review of Systems  Constitutional: Negative.   HENT: Negative.   Eyes: Negative.   Respiratory: Negative.   Cardiovascular: Negative.   Gastrointestinal: Negative.   Genitourinary: Negative.   Musculoskeletal: Negative.   Skin: Negative.   Neurological: Negative.   Psychiatric/Behavioral: Negative.        Objective:   Physical Exam  Nursing note and vitals reviewed. Constitutional: He is oriented to person, place, and time. He appears well-developed and well-nourished.  HENT:  Head: Normocephalic and atraumatic.  Right Ear: External ear normal.  Left Ear: External ear normal.  Nose: Nose normal.  Mouth/Throat: Oropharynx is clear and moist.  Eyes: Conjunctivae and EOM are normal. Pupils are equal, round, and reactive to light.  Neck: Normal range of motion. Neck supple. No JVD present. No tracheal deviation present. No thyromegaly present.  Cardiovascular: Normal rate, regular rhythm, normal heart sounds and intact distal pulses.  Exam reveals no gallop and no friction rub.   No murmur heard. No carotid neurologic bruits peripheral pulses 2+ and symmetrical  Pulmonary/Chest: Effort normal and breath sounds normal. No stridor. No respiratory distress. He has no wheezes. He has no rales. He exhibits no tenderness.  Abdominal: Soft. Bowel sounds are normal. He exhibits no distension and no mass. There is no tenderness. There is no rebound and no guarding.    Genitourinary: Penis normal.  Rectal exam a couple months ago prostate normal and therefore not repeated  Musculoskeletal: Normal range of motion. He exhibits no edema and no tenderness.  Lymphadenopathy:    He has no cervical adenopathy.  Neurological: He is alert and oriented to person, place, and time. He has normal reflexes. No cranial nerve deficit. He exhibits normal muscle tone.  Skin: Skin is warm and dry. No rash noted. No erythema. No pallor.  Total body skin exam normal he has a lot of freckles on his back and chest extremities are fairly well spared. Scar midline were anterior chest wall from previous lesion removed by his dermatologist. 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 His behavior is normal. Judgment and thought content normal.          Assessment & Plan:  Healthy male  Hypertension at goal continue current therapy  Reflux esophagitis OTC Prilosec  Bipolar depression continue followup by Dr. Caprice Beaver and continue the Lamictal 200 mg daily along with Xanax 0.5 when necessary  Family history prostate cancer yearly screening by physical exam......Marland Kitchen begin PSA screening at age 32

## 2013-09-29 NOTE — Progress Notes (Signed)
Pre visit review using our clinic review tool, if applicable. No additional management support is needed unless otherwise documented below in the visit note. 

## 2013-10-13 ENCOUNTER — Encounter: Payer: Self-pay | Admitting: Family Medicine

## 2013-10-13 ENCOUNTER — Ambulatory Visit (INDEPENDENT_AMBULATORY_CARE_PROVIDER_SITE_OTHER): Payer: 59 | Admitting: Family Medicine

## 2013-10-13 VITALS — BP 132/90 | HR 78 | Temp 97.8°F | Wt 181.0 lb

## 2013-10-13 DIAGNOSIS — J018 Other acute sinusitis: Secondary | ICD-10-CM

## 2013-10-13 DIAGNOSIS — R062 Wheezing: Secondary | ICD-10-CM

## 2013-10-13 MED ORDER — AMOXICILLIN-POT CLAVULANATE 875-125 MG PO TABS: 1.0000 | ORAL_TABLET | Freq: Two times a day (BID) | ORAL | Status: DC

## 2013-10-13 MED ORDER — PREDNISONE 10 MG PO TABS
ORAL_TABLET | ORAL | Status: DC
Start: 2013-10-13 — End: 2014-02-03

## 2013-10-13 NOTE — Progress Notes (Signed)
Pre visit review using our clinic review tool, if applicable. No additional management support is needed unless otherwise documented below in the visit note. 

## 2013-10-13 NOTE — Patient Instructions (Signed)

## 2013-10-13 NOTE — Progress Notes (Signed)
   Subjective:    Patient ID: Thomas Williamson, male    DOB: 03/06/1975, 38 y.o.   MRN: [id number]  Cough Associated symptoms include wheezing. Pertinent negatives include no chills, fever or shortness of breath.   Acute visit. Over one week history of sore throat, sinus pressure, cough, yellowish nasal discharge. Intermittent headaches. Malaise. Mostly left maxillary sinus pressure. No relief with over-the-counter medications. His cough has gotten worse over the week. Intermittent wheezing.  Past Medical History  Diagnosis Date  . Allergy   . GERD (gastroesophageal reflux disease)   . IBS (irritable bowel syndrome)   . Hypertension   . Anxiety   . PONV (postoperative nausea and vomiting)    Past Surgical History  Procedure Laterality Date  . Anterior cruciate ligament repair  2010  . Nose surgery  6811,5726  . Inguinal hernia repair  02/23/2012    Procedure: LAPAROSCOPIC BILATERAL INGUINAL HERNIA REPAIR;  Surgeon: Shann Medal, MD;  Location: [facility];  Service: General;  Laterality: Bilateral;  Laparoscopic Bilateral Inguinal Hernia Repair with mesh  . Insertion of mesh  02/23/2012    Procedure: INSERTION OF MESH;  Surgeon: Shann Medal, MD;  Location: [facility];  Service: General;  Laterality: N/A;    reports that he has never smoked. He has never used smokeless tobacco. He reports that he drinks alcohol. He reports that he does not use illicit drugs. family history includes Hypertension in his father and mother. Allergies  Allergen Reactions  . Hydrocodone     UPSETS STOMACH      Review of Systems  Constitutional: Negative for fever and chills.  HENT: Positive for congestion and sinus pressure.   Respiratory: Positive for cough and wheezing. Negative for shortness of breath.        Objective:   Physical Exam  Constitutional: He appears well-developed and well-nourished.  HENT:  Right Ear: External ear normal.  Left Ear: External ear normal.  Mouth/Throat: Oropharynx is  clear and moist.  Neck: Neck supple.  Cardiovascular: Normal rate.   Pulmonary/Chest: Effort normal. No respiratory distress. He has wheezes. He has no rales.  Lymphadenopathy:    He has no cervical adenopathy.          Assessment & Plan:  Acute sinusitis. He has evidence for reactive airway component with wheezing but no respiratory distress. Augmentin 875 mg twice a day for 10 days. Prednisone taper starting at 40 mg daily. Work note written for today and tomorrow.

## 2013-10-30 ENCOUNTER — Ambulatory Visit: Payer: 59 | Admitting: *Deleted

## 2013-11-20 ENCOUNTER — Ambulatory Visit: Payer: 59 | Admitting: *Deleted

## 2013-11-22 ENCOUNTER — Ambulatory Visit (INDEPENDENT_AMBULATORY_CARE_PROVIDER_SITE_OTHER): Payer: 59 | Admitting: *Deleted

## 2013-11-22 DIAGNOSIS — Z23 Encounter for immunization: Secondary | ICD-10-CM

## 2013-12-08 ENCOUNTER — Other Ambulatory Visit: Payer: 59

## 2013-12-12 ENCOUNTER — Other Ambulatory Visit (INDEPENDENT_AMBULATORY_CARE_PROVIDER_SITE_OTHER): Payer: 59

## 2013-12-12 DIAGNOSIS — E7889 Other lipoprotein metabolism disorders: Secondary | ICD-10-CM

## 2013-12-12 LAB — LIPID PANEL
Cholesterol: 253 mg/dL — ABNORMAL HIGH (ref 0–200)
HDL: 33.2 mg/dL — AB (ref >39.00)
NonHDL: 219.8
TRIGLYCERIDES: 363 mg/dL — AB (ref 0.0–149.0)
Total CHOL/HDL Ratio: 8
VLDL: 72.6 mg/dL — AB (ref 0.0–40.0)

## 2013-12-13 LAB — LDL CHOLESTEROL, DIRECT: Direct LDL: 159.6 mg/dL

## 2013-12-25 ENCOUNTER — Other Ambulatory Visit: Payer: Self-pay | Admitting: *Deleted

## 2013-12-25 MED ORDER — PRAVASTATIN SODIUM 10 MG PO TABS: 10.0000 mg | ORAL_TABLET | Freq: Every day | ORAL | Status: DC

## 2014-02-03 ENCOUNTER — Emergency Department (HOSPITAL_COMMUNITY)
Admission: EM | Admit: 2014-02-03 | Discharge: 2014-02-03 | Disposition: A | Discharge status: Home / Self Care | Payer: 59 | Source: Home / Self Care | Attending: Family Medicine | Admitting: Family Medicine

## 2014-02-03 ENCOUNTER — Encounter (HOSPITAL_COMMUNITY): Payer: Self-pay | Admitting: Emergency Medicine

## 2014-02-03 DIAGNOSIS — J40 Bronchitis, not specified as acute or chronic: Secondary | ICD-10-CM

## 2014-02-03 MED ORDER — IPRATROPIUM BROMIDE 0.06 % NA SOLN: 2.0000 | Freq: Four times a day (QID) | NASAL | Status: DC

## 2014-02-03 MED ORDER — BENZONATATE 100 MG PO CAPS: 100.0000 mg | ORAL_CAPSULE | Freq: Three times a day (TID) | ORAL | Status: DC

## 2014-02-03 MED ORDER — PREDNISONE 10 MG PO TABS: 30.0000 mg | ORAL_TABLET | Freq: Every day | ORAL | Status: DC

## 2014-02-03 MED ORDER — GUAIFENESIN-CODEINE 100-10 MG/5ML PO SOLN: 5.0000 mL | Freq: Every evening | ORAL | Status: DC | PRN

## 2014-02-03 NOTE — Discharge Instructions (Signed)
Thank you for coming in today. Call or go to the emergency room if you get worse, have trouble breathing, have chest pains, or palpitations.  Do not take codeine cough medication and work or drive.  Acute Bronchitis Bronchitis is inflammation of the airways that extend from the windpipe into the lungs (bronchi). The inflammation often causes mucus to develop. This leads to a cough, which is the most common symptom of bronchitis.  In acute bronchitis, the condition usually develops suddenly and goes away over time, usually in a couple weeks. Smoking, allergies, and asthma can make bronchitis worse. Repeated episodes of bronchitis may cause further lung problems.  CAUSES Acute bronchitis is most often caused by the same virus that causes a cold. The virus can spread from person to person (contagious) through coughing, sneezing, and touching contaminated objects. SIGNS AND SYMPTOMS   Cough.   Fever.   Coughing up mucus.   Body aches.   Chest congestion.   Chills.   Shortness of breath.   Sore throat.  DIAGNOSIS  Acute bronchitis is usually diagnosed through a physical exam. Your health care provider will also ask you questions about your medical history. Tests, such as chest X-rays, are sometimes done to rule out other conditions.  TREATMENT  Acute bronchitis usually goes away in a couple weeks. Oftentimes, no medical treatment is necessary. Medicines are sometimes given for relief of fever or cough. Antibiotic medicines are usually not needed but may be prescribed in certain situations. In some cases, an inhaler may be recommended to help reduce shortness of breath and control the cough. A cool mist vaporizer may also be used to help thin bronchial secretions and make it easier to clear the chest.  HOME CARE INSTRUCTIONS  Get plenty of rest.   Drink enough fluids to keep your urine clear or pale yellow (unless you have a medical condition that requires fluid restriction).  Increasing fluids may help thin your respiratory secretions (sputum) and reduce chest congestion, and it will prevent dehydration.   Take medicines only as directed by your health care provider.  If you were prescribed an antibiotic medicine, finish it all even if you start to feel better.  Avoid smoking and secondhand smoke. Exposure to cigarette smoke or irritating chemicals will make bronchitis worse. If you are a smoker, consider using nicotine gum or skin patches to help control withdrawal symptoms. Quitting smoking will help your lungs heal faster.   Reduce the chances of another bout of acute bronchitis by washing your hands frequently, avoiding people with cold symptoms, and trying not to touch your hands to your mouth, nose, or eyes.   Keep all follow-up visits as directed by your health care provider.  SEEK MEDICAL CARE IF: Your symptoms do not improve after 1 week of treatment.  SEEK IMMEDIATE MEDICAL CARE IF:  You develop an increased fever or chills.   You have chest pain.   You have severe shortness of breath.  You have bloody sputum.   You develop dehydration.  You faint or repeatedly feel like you are going to pass out.  You develop repeated vomiting.  You develop a severe headache. MAKE SURE YOU:   Understand these instructions.  Will watch your condition.  Will get help right away if you are not doing well or get worse. Document Released: 02/27/2004 Document Revised: 06/05/2013 Document Reviewed: 07/12/2012 [facility] Patient Information 2015 [place], [place]. This information is not intended to replace advice given to you by your health care provider.  Make sure you discuss any questions you have with your health care provider. ° °

## 2014-02-03 NOTE — ED Notes (Signed)
C/o cold sx onset 3-4 days Sx include: HA, chills, diarrhea, dry cough, fevers Has been resting Alert, no signs of acute distress.

## 2014-02-03 NOTE — ED Notes (Signed)
Bed: UC06 Expected date:  Expected time:  Means of arrival:  Comments: Nixed at 1800

## 2014-02-03 NOTE — ED Provider Notes (Signed)
Thomas Williamson is a 39 y.o. male who presents to Urgent Care today for cough congestion headache chills fatigue. Symptoms present for 3-4 days. Patient has a mild fever. No vomiting or diarrhea. Patient has tried Tylenol which helps.   Past Medical History  Diagnosis Date  . Allergy   . GERD (gastroesophageal reflux disease)   . IBS (irritable bowel syndrome)   . Hypertension   . Anxiety   . PONV (postoperative nausea and vomiting)    Past Surgical History  Procedure Laterality Date  . Anterior cruciate ligament repair  2010  . Nose surgery  [id number]  . Inguinal hernia repair  02/23/2012    Procedure: LAPAROSCOPIC BILATERAL INGUINAL HERNIA REPAIR;  Surgeon: Shann Medal, MD;  Location: [facility];  Service: General;  Laterality: Bilateral;  Laparoscopic Bilateral Inguinal Hernia Repair with mesh  . Insertion of mesh  02/23/2012    Procedure: INSERTION OF MESH;  Surgeon: Shann Medal, MD;  Location: [facility];  Service: General;  Laterality: N/A;   History  Substance Use Topics  . Smoking status: Never Smoker   . Smokeless tobacco: Never Used  . Alcohol Use: Yes     Comment: Social   ROS as above Medications: No current facility-administered medications for this encounter.   Current Outpatient Prescriptions  Medication Sig Dispense Refill  . ALPRAZolam (XANAX) 0.5 MG tablet Take 0.5 mg by mouth as needed. anxiety    . benzonatate (TESSALON) 100 MG capsule Take 1 capsule (100 mg total) by mouth every 8 (eight) hours. 30 capsule 1  . guaiFENesin-codeine 100-10 MG/5ML syrup Take 5 mLs by mouth at bedtime as needed for cough. 120 mL 0  . hydrocortisone-pramoxine (PROCTOFOAM-HC) rectal foam Place 1 applicator rectally 2 (two) times daily. 10 g 3  . ipratropium (ATROVENT) 0.06 % nasal spray Place 2 sprays into both nostrils 4 (four) times daily. 15 mL 1  . lamoTRIgine (LAMICTAL) 200 MG tablet Take 200 mg by mouth every evening.     Marland Kitchen losartan-hydrochlorothiazide (HYZAAR) 50-12.5 MG per  tablet TAKE 1 TABLET TWICE A DAY 180 tablet 4  . omeprazole (PRILOSEC) 20 MG capsule Take 20 mg by mouth daily.    . pravastatin (PRAVACHOL) 10 MG tablet Take 1 tablet (10 mg total) by mouth daily. 90 tablet 3  . predniSONE (DELTASONE) 10 MG tablet Take 3 tablets (30 mg total) by mouth daily. 15 tablet 0   Allergies  Allergen Reactions  . Hydrocodone     UPSETS STOMACH     Exam:  BP 154/102 mmHg  Pulse 82  Temp(Src) 98.1 F (36.7 C) (Oral)  Resp 16  SpO2 100% Gen: Well NAD HEENT: EOMI,  MMM posterior pharynx with cobblestoning. Normal tympanic membranes bilaterally. Lungs: Normal work of breathing. CTABL Heart: RRR no MRG Abd: NABS, Soft. Nondistended, Nontender Exts: Brisk capillary refill, warm and well perfused.   No results found for this or any previous visit (from the past 24 hour(s)). No results found.  Assessment and Plan: 40 y.o. male with viral bronchitis. Treatment with prednisone Tessalon codeine containing cough medication and Atrovent nasal spray. Work note provided  Discussed warning signs or symptoms. Please see discharge instructions. Patient expresses understanding.     Gregor Hams, MD 02/03/14 [phone]

## 2014-02-26 ENCOUNTER — Other Ambulatory Visit: Payer: 59

## 2014-02-26 ENCOUNTER — Other Ambulatory Visit: Payer: Self-pay | Admitting: *Deleted

## 2014-02-26 DIAGNOSIS — I10 Essential (primary) hypertension: Secondary | ICD-10-CM

## 2014-03-07 ENCOUNTER — Ambulatory Visit: Payer: 59 | Admitting: Family Medicine

## 2014-03-14 ENCOUNTER — Ambulatory Visit (INDEPENDENT_AMBULATORY_CARE_PROVIDER_SITE_OTHER): Payer: 59 | Admitting: Family Medicine

## 2014-03-14 ENCOUNTER — Encounter: Payer: Self-pay | Admitting: Family Medicine

## 2014-03-14 VITALS — BP 138/98 | Temp 97.7°F | Wt 184.0 lb

## 2014-03-14 DIAGNOSIS — I10 Essential (primary) hypertension: Secondary | ICD-10-CM

## 2014-03-14 DIAGNOSIS — E7889 Other lipoprotein metabolism disorders: Secondary | ICD-10-CM

## 2014-03-14 LAB — HEPATIC FUNCTION PANEL
ALT: 28 U/L (ref 0–53)
AST: 19 U/L (ref 0–37)
Albumin: 4.8 g/dL (ref 3.5–5.2)
Alkaline Phosphatase: 77 U/L (ref 39–117)
BILIRUBIN TOTAL: 0.4 mg/dL (ref 0.2–1.2)
Bilirubin, Direct: 0.1 mg/dL (ref 0.0–0.3)
Total Protein: 7.8 g/dL (ref 6.0–8.3)

## 2014-03-14 LAB — LIPID PANEL
Cholesterol: 238 mg/dL — ABNORMAL HIGH (ref 0–200)
HDL: 38.7 mg/dL — AB (ref >39.00)
Total CHOL/HDL Ratio: 6
Triglycerides: 521 mg/dL — ABNORMAL HIGH (ref 0.0–149.0)

## 2014-03-14 LAB — LDL CHOLESTEROL, DIRECT: LDL DIRECT: 137 mg/dL

## 2014-03-14 MED ORDER — LOSARTAN POTASSIUM-HCTZ 100-12.5 MG PO TABS: 1.0000 | ORAL_TABLET | Freq: Every day | ORAL | Status: DC

## 2014-03-14 NOTE — Progress Notes (Signed)
   Subjective:    Patient ID: Merlyn Conley, male    DOB: 12/18/1975, 39 y.o.   MRN: [id number]  HPI Kaedin is a 39 year old male who comes in today for follow-up of hypertension and hyperlipidemia  He's on Hyzaar 50-12.5 for hypertension BP 138-88-90. BP here today 138/98. He takes his medication daily  We started him on Pravachol 2 months ago for hyperlipidemia. He was supposed to come back last week for follow-up lipids and he never came. We'll check lipid panel today. He's been taking his medication    Review of Systems review of systems otherwise    Objective:   Physical Exam   well-developed well-nourished male no acute distress vital signs stable he is afebrile BP right arm sitting position 130/98       Assessment & Plan:  Hypertension not at goal,,,, increase to 100 mg daily BP check daily follow-up in my  Hyperlipidemia,,,,,,,,,,, on Pravachol 10 mg daily for 2 months follow-up labs today,,,

## 2014-03-14 NOTE — Patient Instructions (Signed)
Hyzaar 100 mg........ one tablet daily in the morning  Check your blood pressure daily in the morning  Blood pressure goal 135/85 or less............ if in 4 weeks your blood pressure is still not normal the return and we will readjust her medication  Labs today  We'll call you the report

## 2014-03-14 NOTE — Progress Notes (Signed)
Pre visit review using our clinic review tool, if applicable. No additional management support is needed unless otherwise documented below in the visit note. 

## 2014-03-16 ENCOUNTER — Telehealth: Payer: Self-pay | Admitting: Family Medicine

## 2014-03-16 NOTE — Telephone Encounter (Signed)
emmi emailed °

## 2014-04-25 ENCOUNTER — Ambulatory Visit: Payer: 59 | Admitting: Nurse Practitioner

## 2014-05-02 ENCOUNTER — Encounter: Payer: Self-pay | Admitting: Internal Medicine

## 2014-05-02 ENCOUNTER — Ambulatory Visit (INDEPENDENT_AMBULATORY_CARE_PROVIDER_SITE_OTHER): Payer: 59 | Admitting: Internal Medicine

## 2014-05-02 VITALS — BP 122/80 | HR 74 | Temp 97.9°F | Resp 20 | Ht 69.5 in | Wt 179.0 lb

## 2014-05-02 DIAGNOSIS — N342 Other urethritis: Secondary | ICD-10-CM | POA: Diagnosis not present

## 2014-05-02 DIAGNOSIS — I10 Essential (primary) hypertension: Secondary | ICD-10-CM

## 2014-05-02 DIAGNOSIS — E7889 Other lipoprotein metabolism disorders: Secondary | ICD-10-CM

## 2014-05-02 DIAGNOSIS — B9789 Other viral agents as the cause of diseases classified elsewhere: Secondary | ICD-10-CM

## 2014-05-02 DIAGNOSIS — J069 Acute upper respiratory infection, unspecified: Secondary | ICD-10-CM | POA: Diagnosis not present

## 2014-05-02 MED ORDER — DOXYCYCLINE HYCLATE 100 MG PO TABS
100.0000 mg | ORAL_TABLET | Freq: Two times a day (BID) | ORAL | Status: DC
Start: 2014-05-02 — End: 2014-06-26

## 2014-05-02 MED ORDER — PREDNISONE 20 MG PO TABS: 20.0000 mg | ORAL_TABLET | Freq: Two times a day (BID) | ORAL | Status: DC

## 2014-05-02 MED ORDER — FLUTICASONE PROPIONATE 50 MCG/ACT NA SUSP: 2.0000 | Freq: Every day | NASAL | Status: DC

## 2014-05-02 NOTE — Patient Instructions (Signed)
Acute bronchitis symptoms  are generally not helped by antibiotics.  Take over-the-counter expectorants and cough medications such as  Mucinex DM.  Call if there is no improvement in 5 to 7 days or if  you develop worsening cough, fever, or new symptoms, such as shortness of breath or chest pain.  TREATMENT  Acute bronchitis usually goes away in a couple weeks. Oftentimes, no medical treatment is necessary. Medicines are sometimes given for relief of fever or cough. Antibiotic medicines are usually not needed but may be prescribed in certain situations. In some cases, an inhaler may be recommended to help reduce shortness of breath and control the cough. A cool mist vaporizer may also be used to help thin bronchial secretions and make it easier to clear the chest.   HOME CARE INSTRUCTIONS  Get plenty of rest.  Drink enough fluids to keep your urine clear or pale yellow (unless you have a medical condition that requires fluid restriction). Increasing fluids may help thin your respiratory secretions (sputum) and reduce chest congestion, and it will prevent dehydration.  Take medicines only as directed by your health care provider.   Avoid smoking and secondhand smoke. Exposure to cigarette smoke or irritating chemicals will make bronchitis worse.  Reduce the chances of another bout of acute bronchitis by washing your hands frequently, avoiding people with cold symptoms, and trying not to touch your hands to your mouth, nose, or eyes.

## 2014-05-02 NOTE — Progress Notes (Signed)
Subjective:    Patient ID: Thomas Williamson, male    DOB: 01/15/1976, 39 y.o.   MRN: [id number]  HPI 39 year old patient who presents with a two-week history of cough, chest congestion, sinus congestion and hoarseness.  He does have some mild postnasal drip and the some fatigue.  No fever.  Cough does not interfere with sleep.  He has hypertension treated with ARB and diuretic therapy, which she has been on for years.  He also complains of a 4-5 day history of dysuria with a small urethral discharge.  He states that he has had urethral right is in the past but no history of STD.  He is monogamous with a girlfriend  Past Medical History  Diagnosis Date  . Allergy   . GERD (gastroesophageal reflux disease)   . IBS (irritable bowel syndrome)   . Hypertension   . Anxiety   . PONV (postoperative nausea and vomiting)     History   Social History  . Marital Status: Single    Spouse Name: N/A  . Number of Children: N/A  . Years of Education: N/A   Occupational History  . Not on file.   Social History Main Topics  . Smoking status: Never Smoker   . Smokeless tobacco: Never Used  . Alcohol Use: Yes     Comment: Social  . Drug Use: No  . Sexual Activity: Not on file   Other Topics Concern  . Not on file   Social History Narrative    Past Surgical History  Procedure Laterality Date  . Anterior cruciate ligament repair  2010  . Nose surgery  [id number]  . Inguinal hernia repair  02/23/2012    Procedure: LAPAROSCOPIC BILATERAL INGUINAL HERNIA REPAIR;  Surgeon: Shann Medal, MD;  Location: [facility];  Service: General;  Laterality: Bilateral;  Laparoscopic Bilateral Inguinal Hernia Repair with mesh  . Insertion of mesh  02/23/2012    Procedure: INSERTION OF MESH;  Surgeon: Shann Medal, MD;  Location: [facility];  Service: General;  Laterality: N/A;    Family History  Problem Relation Age of Onset  . Hypertension Mother   . Hypertension Father     Allergies  Allergen Reactions    . Hydrocodone     UPSETS STOMACH    Current Outpatient Prescriptions on File Prior to Visit  Medication Sig Dispense Refill  . ALPRAZolam (XANAX) 0.5 MG tablet Take 0.5 mg by mouth as needed. anxiety    . hydrocortisone-pramoxine (PROCTOFOAM-HC) rectal foam Place 1 applicator rectally 2 (two) times daily. 10 g 3  . lamoTRIgine (LAMICTAL) 200 MG tablet Take 200 mg by mouth every evening.     Marland Kitchen losartan-hydrochlorothiazide (HYZAAR) 100-12.5 MG per tablet Take 1 tablet by mouth daily. 100 tablet 3  . omeprazole (PRILOSEC) 20 MG capsule Take 20 mg by mouth daily.    . pravastatin (PRAVACHOL) 10 MG tablet Take 1 tablet (10 mg total) by mouth daily. 90 tablet 3   No current facility-administered medications on file prior to visit.    BP 122/80 mmHg  Pulse 74  Temp(Src) 97.9 F (36.6 C) (Oral)  Resp 20  Ht 5' 9.5" (1.765 m)  Wt 179 lb (81.194 kg)  BMI 26.06 kg/m2  SpO2 98%      Review of Systems  Constitutional: Positive for activity change and fatigue. Negative for fever, chills and appetite change.  HENT: Positive for congestion, postnasal drip, sinus pressure and voice change. Negative for dental problem, ear pain, hearing loss,  sore throat, tinnitus and trouble swallowing.   Eyes: Negative for pain, discharge and visual disturbance.  Respiratory: Positive for cough. Negative for chest tightness, wheezing and stridor.   Cardiovascular: Negative for chest pain, palpitations and leg swelling.  Gastrointestinal: Negative for nausea, vomiting, abdominal pain, diarrhea, constipation, blood in stool and abdominal distention.  Genitourinary: Negative for urgency, hematuria, flank pain, discharge, difficulty urinating and genital sores.  Musculoskeletal: Negative for myalgias, back pain, joint swelling, arthralgias, gait problem and neck stiffness.  Skin: Negative for rash.  Neurological: Negative for dizziness, syncope, speech difficulty, weakness, numbness and headaches.   Hematological: Negative for adenopathy. Does not bruise/bleed easily.  Psychiatric/Behavioral: Negative for behavioral problems and dysphoric mood. The patient is not nervous/anxious.        Objective:   Physical Exam  Constitutional: He is oriented to person, place, and time. He appears well-developed.  HENT:  Head: Normocephalic.  Right Ear: External ear normal.  Left Ear: External ear normal.  Eyes: Conjunctivae and EOM are normal.  Neck: Normal range of motion.  Cardiovascular: Normal rate and normal heart sounds.   Pulmonary/Chest: Breath sounds normal.  Abdominal: Bowel sounds are normal.  Musculoskeletal: Normal range of motion. He exhibits no edema or tenderness.  Neurological: He is alert and oriented to person, place, and time.  Psychiatric: He has a normal mood and affect. His behavior is normal.          Assessment & Plan:   Viral URI with cough.  Will treat symptomatically Hypertension, stable Nonspecific urethritis.  Will treat with doxycycline for 7 days

## 2014-05-02 NOTE — Progress Notes (Signed)
Pre visit review using our clinic review tool, if applicable. No additional management support is needed unless otherwise documented below in the visit note. 

## 2014-05-24 ENCOUNTER — Ambulatory Visit: Payer: 59 | Admitting: *Deleted

## 2014-06-26 ENCOUNTER — Encounter: Payer: Self-pay | Admitting: Family Medicine

## 2014-06-26 ENCOUNTER — Ambulatory Visit (INDEPENDENT_AMBULATORY_CARE_PROVIDER_SITE_OTHER): Payer: 59 | Admitting: Family Medicine

## 2014-06-26 ENCOUNTER — Telehealth: Payer: Self-pay | Admitting: Family Medicine

## 2014-06-26 VITALS — BP 120/88 | Temp 97.6°F | Wt 170.0 lb

## 2014-06-26 DIAGNOSIS — A084 Viral intestinal infection, unspecified: Secondary | ICD-10-CM | POA: Insufficient documentation

## 2014-06-26 MED ORDER — ONDANSETRON HCL 4 MG PO TABS: 4.0000 mg | ORAL_TABLET | Freq: Three times a day (TID) | ORAL | Status: DC | PRN

## 2014-06-26 NOTE — Telephone Encounter (Signed)
Pt called has nausea,weakness,lightheaded and would like to see Dr Thomas Williamson today

## 2014-06-26 NOTE — Progress Notes (Signed)
Pre visit review using our clinic review tool, if applicable. No additional management support is needed unless otherwise documented below in the visit note. 

## 2014-06-26 NOTE — Patient Instructions (Signed)
Zofran 4 mg........Marland Kitchen 13 times daily when necessary for nausea and vomiting  Clear liquid diet......Marland Kitchen ginger ale water 7 up Gatorade only ......... no food  Rest at home  Tylenol ........... 2 tabs 3 times daily for fever and chills  Okay to go back to work on Friday  Hold all your medications until you are well .............Marland Kitchen

## 2014-06-26 NOTE — Telephone Encounter (Signed)
Please schedule patient for 3:15 today. Patient is aware. thanks

## 2014-06-26 NOTE — Telephone Encounter (Signed)
Pt has been sch

## 2014-06-26 NOTE — Progress Notes (Signed)
   Subjective:    Patient ID: Thomas Williamson, male    DOB: September 30, 1975, 39 y.o.   MRN: [id number]  HPI Thomas Williamson is a 39 year old single male nonsmoker who works at the [place] [employer] who comes in today with a 24-36 hour history of nausea vomiting and diarrhea  He otherwise feels well he is urinating normally. No abdominal pain except for soreness in the dry heaves   Review of Systems Review of systems otherwise negative nobody sick that he knows of    Objective:   Physical Exam  Well-developed well-nourished male no acute distress vital signs stable he is afebrile examination the abdomen abdomen soft bowel sounds normal there is no tenderness no rebound no masses      Assessment & Plan:  Viral gastroenteritis,,,,,,,,,,,, treat symptomatically with Tylenol fluids bedrest and Zofran

## 2014-10-16 ENCOUNTER — Other Ambulatory Visit: Payer: Self-pay | Admitting: Family Medicine

## 2014-11-19 ENCOUNTER — Telehealth: Payer: Self-pay | Admitting: Family Medicine

## 2014-11-19 NOTE — Telephone Encounter (Signed)
Pt has had neck pain coming on slowly, getting worse today this this is the second day he has really noticed and it has hurt this much. Pt states pain is between neck and shoulder.  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 Pt wants to know if  Dr todd wants to see him.  Pt not sure he should work if this pain persist. pls advise if ok to schedule pt today or tomorrow, or refer.

## 2014-11-19 NOTE — Telephone Encounter (Signed)
Okay to work in tomorrow per Dr Todd. 

## 2014-11-20 ENCOUNTER — Encounter: Payer: Self-pay | Admitting: Family Medicine

## 2014-11-20 ENCOUNTER — Ambulatory Visit (INDEPENDENT_AMBULATORY_CARE_PROVIDER_SITE_OTHER): Payer: 59 | Admitting: Family Medicine

## 2014-11-20 VITALS — BP 120/80 | Temp 98.3°F | Wt 176.0 lb

## 2014-11-20 DIAGNOSIS — M5090 Cervical disc disorder, unspecified, unspecified cervical region: Secondary | ICD-10-CM | POA: Diagnosis not present

## 2014-11-20 HISTORY — DX: Cervical disc disorder, unspecified, unspecified cervical region: M50.90

## 2014-11-20 MED ORDER — TRAMADOL HCL 50 MG PO TABS: ORAL_TABLET | ORAL | Status: DC

## 2014-11-20 MED ORDER — PREDNISONE 20 MG PO TABS: ORAL_TABLET | ORAL | Status: DC
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
## 2014-11-20 NOTE — Progress Notes (Signed)
Pre visit review using our clinic review tool, if applicable. No additional management support is needed unless otherwise documented below in the visit note. 

## 2014-11-20 NOTE — Patient Instructions (Addendum)
Prednisone 20 mg............. 2 tabs 3 days then taper as outlined  Tramadol 50 mg,,, 1/2-1 tablet at bedtime as needed for severe pain  Physical therapy ASAP

## 2014-11-23 ENCOUNTER — Telehealth: Payer: Self-pay | Admitting: Family Medicine

## 2014-11-23 DIAGNOSIS — M542 Cervicalgia: Secondary | ICD-10-CM

## 2014-11-23 NOTE — Telephone Encounter (Signed)
Pt states tramadol is making him foggy. Pt is aware md out of office until Monday. [facility]

## 2014-11-26 ENCOUNTER — Ambulatory Visit: Payer: 59 | Attending: Family Medicine | Admitting: Physical Therapy

## 2014-11-26 DIAGNOSIS — R293 Abnormal posture: Secondary | ICD-10-CM

## 2014-11-26 DIAGNOSIS — M542 Cervicalgia: Secondary | ICD-10-CM | POA: Diagnosis not present

## 2014-11-26 DIAGNOSIS — R29898 Other symptoms and signs involving the musculoskeletal system: Secondary | ICD-10-CM | POA: Diagnosis present

## 2014-11-26 DIAGNOSIS — M25511 Pain in right shoulder: Secondary | ICD-10-CM | POA: Diagnosis present
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
## 2014-11-26 NOTE — Telephone Encounter (Signed)
Patient is taking PT now and will go for x-rays per Dr Sherren Mocha

## 2014-11-26 NOTE — Patient Instructions (Signed)
Levator Stretch   Grasp seat or sit on hand on side to be stretched. Turn head toward other side and look down. Use hand on head to gently stretch neck in that position. Hold _30___ seconds. Repeat on other side. Repeat __2-3_ times. Do __2-3__ sessions per day.  http://gt2.exer.us/30   Copyright  VHI. All rights reserved.  Side-Bending   One hand on opposite side of head, pull head to side as far as is comfortable. Stop if there is pain. Hold _30___ seconds. Repeat with other hand to other side. Repeat 2-3____ times. Do _2-3___ sessions per day.   Copyright  VHI. All rights reserved.  Scapular Retraction (Standing) Trigger Point Dry Needling  . What is Trigger Point Dry Needling (DN)? o DN is a physical therapy technique used to treat muscle pain and dysfunction. Specifically, DN helps deactivate muscle trigger points (muscle knots).  o A thin filiform needle is used to penetrate the skin and stimulate the underlying trigger point. The goal is for a local twitch response (LTR) to occur and for the trigger point to relax. No medication of any kind is injected during the procedure.   . What Does Trigger Point Dry Needling Feel Like?  o The procedure feels different for each individual patient. Some patients report that they do not actually feel the needle enter the skin and overall the process is not painful. Very mild bleeding may occur. However, many patients feel a deep cramping in the muscle in which the needle was inserted. This is the local twitch response.   Marland Kitchen How Will I feel after the treatment? o Soreness is normal, and the onset of soreness may not occur for a few hours. Typically this soreness does not last longer than two days.  o Bruising is uncommon, however; ice can be used to decrease any possible bruising.  o In rare cases feeling tired or nauseous after the treatment is normal. In addition, your symptoms may get worse before they get better, this period will typically  not last longer than 24 hours.   . What Can I do After My Treatment? o Increase your hydration by drinking more water for the next 24 hours. o You may place ice or heat on the areas treated that have become sore, however, do not use heat on inflamed or bruised areas. Heat often brings more relief post needling. o You can continue your regular activities, but vigorous activity is not recommended initially after the treatment for 24 hours. DN is best combined with other physical therapy such as strengthening, stretching, and other therapies.  Posture Tips DO: - stand tall and erect - keep chin tucked in - keep head and shoulders in alignment - check posture regularly in mirror or large window - pull head back against headrest in car seat;  Change your position often.  Sit with lumbar support. DON'T: - slouch or slump while watching TV or reading - sit, stand or lie in one position  for too long;  Sitting is especially hard on the spine so if you sit at a desk/use the computer, then stand up often!   Copyright  VHI. All rights reserved.  Posture - Standing   Good posture is important. Avoid slouching and forward head thrust. Maintain curve in low back and align ears over shoul- ders, hips over ankles.  Pull your belly button in toward your back bone. Stand with even weight into toes and heels.  Rib cage lifted and chin tucked as shown in  clinic.  Not military   Copyright  VHI. All rights reserved.  Posture - Sitting   Sit upright, head facing forward. Try using a roll to support lower back. Keep shoulders relaxed, and avoid rounded back. Keep hips level with knees. Avoid crossing legs for long periods.  Sit on sit bones not tail bone.  Dont sit in a C curve.  Dont perch on edge of seat.  Keep feet flat.   Copyright  VHI. All rights reserved.   Thomas Williamson, PT 11/26/2014 2:09 PM Phone: [phone] Fax: [phone]

## 2014-11-26 NOTE — Telephone Encounter (Signed)
Patient called again inquiring about a new pain med that will agree with his body.

## 2014-11-29 ENCOUNTER — Ambulatory Visit: Payer: 59 | Admitting: Physical Therapy

## 2014-11-29 DIAGNOSIS — M542 Cervicalgia: Secondary | ICD-10-CM | POA: Diagnosis not present

## 2014-11-29 DIAGNOSIS — R293 Abnormal posture: Secondary | ICD-10-CM

## 2014-11-29 DIAGNOSIS — M25511 Pain in right shoulder: Secondary | ICD-10-CM

## 2014-11-29 DIAGNOSIS — R29898 Other symptoms and signs involving the musculoskeletal system: Secondary | ICD-10-CM

## 2014-11-29 NOTE — Therapy (Signed)
[place], [place], [address] Phone: [phone]   Fax:  [phone]  Physical Therapy Treatment  Patient Details  Name: Thomas Williamson MRN: [id number] Date of Birth: 25-Feb-1975 Referring Provider: Stevie Kern  MD  Encounter Date: 11/29/2014      PT End of Session - 11/29/14 1208    Visit Number 2   Number of Visits 12   Date for PT Re-Evaluation 01/07/15   PT Start Time 3009   PT Stop Time 1103   PT Time Calculation (min) 48 min   Activity Tolerance Patient tolerated treatment well   Behavior During Therapy [facility] for tasks assessed/performed      Past Medical History  Diagnosis Date  . Allergy   . GERD (gastroesophageal reflux disease)   . IBS (irritable bowel syndrome)   . Hypertension   . Anxiety   . PONV (postoperative nausea and vomiting)     Past Surgical History  Procedure Laterality Date  . Anterior cruciate ligament repair  2010  . Nose surgery  2330,0762  . Inguinal hernia repair  02/23/2012    Procedure: LAPAROSCOPIC BILATERAL INGUINAL HERNIA REPAIR;  Surgeon: Shann Medal, MD;  Location: [facility];  Service: General;  Laterality: Bilateral;  Laparoscopic Bilateral Inguinal Hernia Repair with mesh  . Insertion of mesh  02/23/2012    Procedure: INSERTION OF MESH;  Surgeon: Shann Medal, MD;  Location: [facility];  Service: General;  Laterality: N/A;    There were no vitals filed for this visit.  Visit Diagnosis:  Neck pain on right side  Pain in joint of right shoulder  Abnormal posture  Decreased ROM of neck      Subjective Assessment - 11/29/14 1019    Subjective Sore, thinks needling helped a bit.     Currently in Pain? Yes   Pain Score 5    Pain Location Neck  Scapula   Pain Orientation Right   Pain Descriptors / Indicators Spasm   Pain Type Acute pain   Pain Radiating Towards scapular R   Pain Onset More than a month ago   Pain Frequency Intermittent   Multiple Pain  Sites No                         [facility] Adult PT Treatment/Exercise - 11/29/14 1025    Neck Exercises: Supine   Neck Retraction 10 reps;5 secs   Upper Extremity D1 Flexion;10 reps;Theraband   Theraband Level (UE D1) Level 2 (Red)   Other Supine Exercise chin tuck and scapular retraction x 10 cues for less effort   Other Supine Exercise ER red band with chin tuck x 10 , Horiz abd red band x 10    Shoulder Exercises: ROM/Strengthening   UBE (Upper Arm Bike) level 1, 5 min FW for warm up, corrected posture   Manual Therapy   Manual Therapy Soft tissue mobilization;Joint mobilization   Joint Mobilization PA mobs lateral PA from right grade 3   Soft tissue mobilization Rt medial border scap, including levator scap, upper trap, supine suboccipital release, worked Rt. splenius capitis and upper trap here as well.    Neck Exercises: Stretches   Upper Trapezius Stretch 2 reps;30 seconds   Upper Trapezius Stretch Limitations min stretch felt good ROM lateral flexion   Levator Stretch 2 reps;30 seconds   Levator Stretch Limitations more limited here than UT        Used biofreeze for massage.  PT Education - 11/29/14 1208    Education provided Yes   Education Details HEP, anatomy, diff diagnosis, soft tissue mob and trigger pts   Person(s) Educated Patient   Methods Explanation;Demonstration;Handout   Comprehension Verbalized understanding;Returned demonstration;Need further instruction;Verbal cues required          PT Short Term Goals - 11/26/14 1846    PT SHORT TERM GOAL #1   Title STG=LTG           PT Long Term Goals - 11/29/14 1212    PT LONG TERM GOAL #1   Title "Demonstrate and verbalize techniques to reduce the risk of re-injury including: lifting, posture, body mechanics   Status On-going   PT LONG TERM GOAL #2   Title "Pt will be independent with advanced HEP.    Status On-going   PT LONG TERM GOAL #3   Title "Pain will decrease to 2/10  or less with all functional activities   Status On-going   PT LONG TERM GOAL #4   Title "Pt will tolerate sitting 1 hour without increased pain to ride in car without increased pain while turning head   Status On-going   PT LONG TERM GOAL #5   Title "Pt will tolerate working on computer for 2 hours without increased pain/ postural techniques in order to work a full shift   Status On-going   PT [place] #6   Title "FOTO will improve from 68% limitation    to    39% limitation indicating improved functional mobility   Status On-going               Plan - 11/29/14 1210    Clinical Impression Statement Patient with favorable reposonse to TrP DN last visit, cont with pain into C6-C7 distribution and many questions regarding his condition.  He reports difficulty with given band exercises today but understands importance of doing them.  He had relief of symptoms today overall, mild pain after at levator scap origin.     PT Next Visit Plan review HEP, cont stabilization and repeat DN/STW.    PT Home Exercise Plan trigger point dry needling aftercare and levator /uppper trap stretch   Consulted and Agree with Plan of Care Patient        Problem List Patient Active Problem List   Diagnosis Date Noted  . Cervical disc disease 11/20/2014  . Viral gastroenteritis 06/26/2014  . Lipids abnormal 09/29/2013  . Bleeding internal hemorrhoids 09/12/2013  . Uvulitis 11/02/2012  . IBS (irritable bowel syndrome) 02/16/2012  . Hypertension 02/24/2011  . Bipolar depression ([place]) 02/24/2011  . Hernia, inguinal, bilateral 02/24/2011    PAA,JENNIFER 11/29/2014, 12:17 PM  [place], [place], [address] Phone: [phone]   Fax:  [phone]  Name: Thomas Williamson MRN: [id number] Date of Birth: 11-04-1975   Raeford Razor, PT 11/29/2014 12:17 PM Phone: [phone] Fax: [phone]

## 2014-11-29 NOTE — Patient Instructions (Signed)
  Flexibility: Upper Trapezius Stretch   Gently grasp right side of head while reaching behind back with other hand. Tilt head away until a gentle stretch is felt. Hold _30___ seconds. Repeat __2__ times per set. Do __1__ sets per session. Do __1__ sessions per day.  http://orth.exer.us/340   Levator Stretch   Grasp seat or sit on hand on side to be stretched. Turn head toward other side and look down. Use hand on head to gently stretch neck in that position. Hold __30__ seconds. Repeat on other side. Repeat _2___ times. Do ___1_ sessions per day.  http://[email or web address].exer.us/30   Scapular Retraction (Standing)   With arms at sides, pinch shoulder blades together. Repeat __10__ times per set. Do __1-2__ sets per session. Do __2__ sessions per day.  http://orth.exer.us/944   Flexibility: Neck Retraction   Pull head straight back, keeping eyes and jaw level. Repeat __10__ times per set. Do __1__ sets per session. Do __2__ sessions per day.  http://orth.exer.us/344   Posture - Sitting   Sit upright, head facing forward. Try using a roll to support lower back. Keep shoulders relaxed, and avoid rounded back. Keep hips level with knees. Avoid crossing legs for long periods.  Resisted Horizontal Abduction: Bilateral    Sit or stand, tubing in both hands, arms out in front. Keeping arms straight, pinch shoulder blades together and stretch arms out. Repeat ___10-20_ times per set. Do __1__ sets per session. Do __2__ sessions per day.  Keep chin tucked the whole time.  http://orth.exer.us/968   Copyright  VHI. All rights reserved.

## 2014-12-04 ENCOUNTER — Ambulatory Visit: Payer: 59 | Admitting: Physical Therapy

## 2014-12-04 ENCOUNTER — Ambulatory Visit: Payer: 59 | Attending: Family Medicine | Admitting: Physical Therapy

## 2014-12-04 DIAGNOSIS — R293 Abnormal posture: Secondary | ICD-10-CM | POA: Insufficient documentation

## 2014-12-04 DIAGNOSIS — M25511 Pain in right shoulder: Secondary | ICD-10-CM | POA: Insufficient documentation

## 2014-12-04 DIAGNOSIS — R29898 Other symptoms and signs involving the musculoskeletal system: Secondary | ICD-10-CM | POA: Insufficient documentation

## 2014-12-04 DIAGNOSIS — M542 Cervicalgia: Secondary | ICD-10-CM | POA: Insufficient documentation

## 2014-12-07 ENCOUNTER — Ambulatory Visit: Payer: 59 | Admitting: Physical Therapy

## 2014-12-07 DIAGNOSIS — R293 Abnormal posture: Secondary | ICD-10-CM

## 2014-12-07 DIAGNOSIS — M25511 Pain in right shoulder: Secondary | ICD-10-CM | POA: Diagnosis present

## 2014-12-07 DIAGNOSIS — R29898 Other symptoms and signs involving the musculoskeletal system: Secondary | ICD-10-CM | POA: Diagnosis present

## 2014-12-07 DIAGNOSIS — M542 Cervicalgia: Secondary | ICD-10-CM

## 2014-12-07 NOTE — Therapy (Signed)
[place], [place], [address] Phone: [phone]   Fax:  [phone]  Physical Therapy Treatment  Patient Details  Name: Thomas Williamson MRN: [id number] Date of Birth: 03/18/1975 Referring Provider: Stevie Kern  MD  Encounter Date: 12/07/2014      PT End of Session - 12/07/14 1157    Visit Number 3   Number of Visits 12   Date for PT Re-Evaluation 01/07/15   PT Start Time 1102   PT Stop Time 1145   PT Time Calculation (min) 43 min   Activity Tolerance Patient tolerated treatment well   Behavior During Therapy [facility] for tasks assessed/performed      Past Medical History  Diagnosis Date  . Allergy   . GERD (gastroesophageal reflux disease)   . IBS (irritable bowel syndrome)   . Hypertension   . Anxiety   . PONV (postoperative nausea and vomiting)     Past Surgical History  Procedure Laterality Date  . Anterior cruciate ligament repair  2010  . Nose surgery  3474,2595  . Inguinal hernia repair  02/23/2012    Procedure: LAPAROSCOPIC BILATERAL INGUINAL HERNIA REPAIR;  Surgeon: Shann Medal, MD;  Location: [facility];  Service: General;  Laterality: Bilateral;  Laparoscopic Bilateral Inguinal Hernia Repair with mesh  . Insertion of mesh  02/23/2012    Procedure: INSERTION OF MESH;  Surgeon: Shann Medal, MD;  Location: [facility];  Service: General;  Laterality: N/A;    There were no vitals filed for this visit.  Visit Diagnosis:  Neck pain on right side  Pain in joint of right shoulder  Abnormal posture  Decreased ROM of neck      Subjective Assessment - 12/07/14 1108    Subjective Cont with pain, really feeling it in the Rt. shoulder blade.    Currently in Pain? Yes   Pain Score 7    Pain Location Scapula   Pain Orientation Right   Pain Descriptors / Indicators Burning   Pain Type Chronic pain   Pain Onset More than a month ago   Pain Frequency Intermittent   Multiple Pain Sites No                          [facility] Adult PT Treatment/Exercise - 12/07/14 1114    Self-Care   Self-Care Posture   Posture self massage with tennis ball    Neck Exercises: Supine   Neck Retraction 10 reps;5 secs   Neck Retraction Limitations on foam roll   Shoulder ABduction Both;10 reps   Shoulder Abduction Weights (lbs) on foam    Upper Extremity D1 Flexion;10 reps;Theraband   Theraband Level (UE D1) Level 1 (Yellow)   Other Supine Exercise flexion with red band on foam x 10    Other Supine Exercise protract/retract x10    Shoulder Exercises: ROM/Strengthening   Other ROM/Strengthening Exercises supine foam stab ex: Apr 19, 2022 x 10, dead bug x 10    Manual Therapy   Manual Therapy Soft tissue mobilization;Joint mobilization   Joint Mobilization PA mobs lateral PA from right grade 3   Soft tissue mobilization Rt medial border scap, including levator scap, upper trap, supine suboccipital release, worked Rt. splenius capitis and upper trap here as well.                 PT Education - 12/07/14 1157    Education provided Yes   Education Details tennis ball for trigger  points   Person(s) Educated Patient   Methods Explanation;Demonstration;Verbal cues   Comprehension Verbalized understanding;Returned demonstration          PT Short Term Goals - 11/26/14 1846    PT SHORT TERM GOAL #1   Title STG=LTG           PT Long Term Goals - 12/07/14 1200    PT LONG TERM GOAL #1   Title "Demonstrate and verbalize techniques to reduce the risk of re-injury including: lifting, posture, body mechanics   Status On-going   PT LONG TERM GOAL #2   Title "Pt will be independent with advanced HEP.    Status On-going   PT LONG TERM GOAL #3   Title "Pain will decrease to 2/10 or less with all functional activities   Status On-going   PT LONG TERM GOAL #4   Title "Pt will tolerate sitting 1 hour without increased pain to ride in car without increased pain while turning head    Status On-going   PT LONG TERM GOAL #5   Title "Pt will tolerate working on computer for 2 hours without increased pain/ postural techniques in order to work a full shift   Status On-going   PT [place] #6   Title "FOTO will improve from 68% limitation    to    39% limitation indicating improved functional mobility   Status On-going               Plan - 12/07/14 1158    Clinical Impression Statement Pt cont with pain in Rt. levator scap mm and referral of pain and sensory sx.  He missed last visit due to work.  Showed core weakness on foam roller today.  Numbness incr when arms are above 90 deg for over 10 min    PT Next Visit Plan review HEP and advance, cont stabilization and repeat DN/STW. , check specific goals   PT Home Exercise Plan trigger point dry needling aftercare and levator /uppper trap stretch   Consulted and Agree with Plan of Care Patient        Problem List Patient Active Problem List   Diagnosis Date Noted  . Cervical disc disease 11/20/2014  . Viral gastroenteritis 06/26/2014  . Lipids abnormal 09/29/2013  . Bleeding internal hemorrhoids 09/12/2013  . Uvulitis 11/02/2012  . IBS (irritable bowel syndrome) 02/16/2012  . Hypertension 02/24/2011  . Bipolar depression ([place]) 02/24/2011  . Hernia, inguinal, bilateral 02/24/2011    PAA,JENNIFER 12/07/2014, 12:04 PM  [facility] [address] [place], [place], [address] Phone: [phone]   Fax:  [phone]  Name: Thomas Williamson MRN: [id number] Date of Birth: 1975-07-30   Raeford Razor, PT 12/07/2014 12:04 PM Phone: [phone] Fax: [phone]

## 2014-12-12 ENCOUNTER — Ambulatory Visit: Payer: 59 | Admitting: Physical Therapy

## 2014-12-12 DIAGNOSIS — M25511 Pain in right shoulder: Secondary | ICD-10-CM

## 2014-12-12 DIAGNOSIS — M542 Cervicalgia: Secondary | ICD-10-CM

## 2014-12-12 DIAGNOSIS — R29898 Other symptoms and signs involving the musculoskeletal system: Secondary | ICD-10-CM

## 2014-12-12 DIAGNOSIS — R293 Abnormal posture: Secondary | ICD-10-CM

## 2014-12-12 NOTE — Therapy (Signed)
[place], [place], [address] Phone: [phone]   Fax:  [phone]  Physical Therapy Treatment  Patient Details  Name: Thomas Williamson MRN: [id number] Date of Birth: 1975/12/20 Referring Provider: Stevie Kern  MD  Encounter Date: 12/12/2014      PT End of Session - 12/12/14 1050    Visit Number 4   Number of Visits 12   Date for PT Re-Evaluation 01/07/15   PT Start Time 1020   PT Stop Time 1110   PT Time Calculation (min) 50 min   Activity Tolerance Patient tolerated treatment well  Pt wanted more pull next time after next treatment   Behavior During Therapy [facility] for tasks assessed/performed      Past Medical History  Diagnosis Date  . Allergy   . GERD (gastroesophageal reflux disease)   . IBS (irritable bowel syndrome)   . Hypertension   . Anxiety   . PONV (postoperative nausea and vomiting)     Past Surgical History  Procedure Laterality Date  . Anterior cruciate ligament repair  2010  . Nose surgery  9833,8250  . Inguinal hernia repair  02/23/2012    Procedure: LAPAROSCOPIC BILATERAL INGUINAL HERNIA REPAIR;  Surgeon: Shann Medal, MD;  Location: [facility];  Service: General;  Laterality: Bilateral;  Laparoscopic Bilateral Inguinal Hernia Repair with mesh  . Insertion of mesh  02/23/2012    Procedure: INSERTION OF MESH;  Surgeon: Shann Medal, MD;  Location: [facility];  Service: General;  Laterality: N/A;    There were no vitals filed for this visit.  Visit Diagnosis:  Neck pain on right side  Pain in joint of right shoulder  Decreased ROM of neck  Abnormal posture      Subjective Assessment - 12/12/14 1025    Subjective I have pain that radiates into Rt. upper/post. shoulder, been trying to work on posture.     Currently in Pain? Yes   Pain Score 5    Pain Location Neck   Pain Orientation Right   Pain Descriptors / Indicators Burning   Pain Type Chronic pain   Pain Radiating  Towards scapular border   Pain Onset More than a month ago   Pain Frequency Intermittent   Multiple Pain Sites No            [facility] Adult PT Treatment/Exercise - 12/12/14 1031    Neck Exercises: Prone   Shoulder Extension 10 reps   Shoulder Extension Weights (lbs) 2 sets   Rows 20 reps;Weights   Rows Weights (lbs) 5   Other Prone Exercise scap protract/retract    Other Prone Exercise scap depression with arms movements: ER/IR  horiz abd x 10    Shoulder Exercises: ROM/Strengthening   UBE (Upper Arm Bike) 6 min level 4 forward 3 min, and 3 min  back   Traction   Type of Traction Cervical   Min (lbs) 14   Max (lbs) 6   Hold Time 60   Rest Time 15   Time 15                PT Education - 12/12/14 1050    Education provided Yes   Education Details traction rationale, scap stab   Person(s) Educated Patient   Methods Explanation;Demonstration   Comprehension Verbalized understanding;Returned demonstration          PT Short Term Goals - 11/26/14 1846    PT SHORT TERM GOAL #1   Title STG=LTG  PT Long Term Goals - 12/07/14 1200    PT LONG TERM GOAL #1   Title "Demonstrate and verbalize techniques to reduce the risk of re-injury including: lifting, posture, body mechanics   Status On-going   PT LONG TERM GOAL #2   Title "Pt will be independent with advanced HEP.    Status On-going   PT LONG TERM GOAL #3   Title "Pain will decrease to 2/10 or less with all functional activities   Status On-going   PT LONG TERM GOAL #4   Title "Pt will tolerate sitting 1 hour without increased pain to ride in car without increased pain while turning head   Status On-going   PT LONG TERM GOAL #5   Title "Pt will tolerate working on computer for 2 hours without increased pain/ postural techniques in order to work a full shift   Status On-going   PT [place] #6   Title "FOTO will improve from 68% limitation    to    39% limitation indicating improved  functional mobility   Status On-going               Plan - 12/12/14 1051    Clinical Impression Statement Trial of traction today for radicular pain.  Able to tolerate prone scap stab ex well with min increase in neck pain. Progressing towards goals   PT Next Visit Plan assess traction effect, cont with stab, repeat modailites, check spec goals    Consulted and Agree with Plan of Care Patient        Problem List Patient Active Problem List   Diagnosis Date Noted  . Cervical disc disease 11/20/2014  . Viral gastroenteritis 06/26/2014  . Lipids abnormal 09/29/2013  . Bleeding internal hemorrhoids 09/12/2013  . Uvulitis 11/02/2012  . IBS (irritable bowel syndrome) 02/16/2012  . Hypertension 02/24/2011  . Bipolar depression ([place]) 02/24/2011  . Hernia, inguinal, bilateral 02/24/2011    [name] 12/12/2014, 12:35 PM  [facility] [address] [place], [place], [address] Phone: [phone]   Fax:  [phone]  Name: Thomas Williamson MRN: [id number] Date of Birth: 04/20/1975  Raeford Razor, PT 12/12/2014 12:35 PM Phone: [phone] Fax: [phone]

## 2014-12-13 ENCOUNTER — Ambulatory Visit: Payer: 59 | Admitting: Physical Therapy

## 2014-12-18 ENCOUNTER — Ambulatory Visit: Payer: 59 | Admitting: Physical Therapy

## 2014-12-18 DIAGNOSIS — M542 Cervicalgia: Secondary | ICD-10-CM | POA: Diagnosis not present

## 2014-12-18 DIAGNOSIS — M25511 Pain in right shoulder: Secondary | ICD-10-CM

## 2014-12-18 DIAGNOSIS — R29898 Other symptoms and signs involving the musculoskeletal system: Secondary | ICD-10-CM

## 2014-12-18 DIAGNOSIS — R293 Abnormal posture: Secondary | ICD-10-CM

## 2014-12-18 NOTE — Therapy (Addendum)
[place], [place], [address] Phone: [phone]   Fax:  [phone]  Physical Therapy Treatment  Patient Details  Name: Thomas Williamson MRN: [id number] Date of Birth: May 13, 1975 Referring Provider: Stevie Kern  MD  Encounter Date: 12/18/2014      PT End of Session - 12/18/14 1208    Visit Number 5   Number of Visits 12   Date for PT Re-Evaluation 01/07/15   PT Start Time 6010   PT Stop Time 1115   PT Time Calculation (min) 60 min   Activity Tolerance Patient tolerated treatment well   Behavior During Therapy [facility] for tasks assessed/performed      Past Medical History  Diagnosis Date  . Allergy   . GERD (gastroesophageal reflux disease)   . IBS (irritable bowel syndrome)   . Hypertension   . Anxiety   . PONV (postoperative nausea and vomiting)     Past Surgical History  Procedure Laterality Date  . Anterior cruciate ligament repair  2010  . Nose surgery  9323,5573  . Inguinal hernia repair  02/23/2012    Procedure: LAPAROSCOPIC BILATERAL INGUINAL HERNIA REPAIR;  Surgeon: Shann Medal, MD;  Location: [facility];  Service: General;  Laterality: Bilateral;  Laparoscopic Bilateral Inguinal Hernia Repair with mesh  . Insertion of mesh  02/23/2012    Procedure: INSERTION OF MESH;  Surgeon: Shann Medal, MD;  Location: [facility];  Service: General;  Laterality: N/A;    There were no vitals filed for this visit.  Visit Diagnosis:  Neck pain on right side  Pain in joint of right shoulder  Decreased ROM of neck  Abnormal posture      Subjective Assessment - 12/18/14 0936    Subjective "the traction helped I believe, and that I am doing better"    Currently in Pain? Yes   Pain Score 4    Pain Location Neck   Pain Orientation Right   Pain Descriptors / Indicators Shooting   Pain Type Chronic pain   Pain Radiating Towards to Right deltoid   Pain Onset More than a month ago   Pain Frequency  Intermittent   Aggravating Factors  turning my neck, coughing,    Pain Relieving Factors traction helped   Pain Score 4   Pain Location Shoulder   Pain Orientation Right   Pain Descriptors / Indicators Aching;Spasm   Pain Onset More than a month ago   Pain Frequency Intermittent            [facility] PT Assessment - 12/18/14 0001    Observation/Other Assessments   Focus on Therapeutic Outcomes (FOTO)  54% limited                     [facility] Adult PT Treatment/Exercise - 12/18/14 0001    Self-Care   Self-Care Other Self-Care Comments   Other Self-Care Comments  educated on shoulder muscle anatomy and how it can effect the neck and cause referral of pain to the lateral shoulder.    Neck Exercises: Supine   Neck Retraction 10 reps;5 secs   Other Supine Exercise protract/retract x10    Traction   Min (lbs) 8   Max (lbs) 17   Hold Time 60   Rest Time 15   Time 15   Manual Therapy   Manual Therapy Myofascial release   Joint Mobilization PA mobs lateral PA from right grade 3   Soft tissue mobilization Rt medial border  scap, including levator scap, upper trap, supine suboccipital release, worked Rt. splenius capitis and upper trap here as well.    Myofascial Release manual trigger point release of the R upper trap, Levator scap, rhomboids.   Neck Exercises: Stretches   Upper Trapezius Stretch 2 reps;30 seconds   Levator Stretch 2 reps;30 seconds                PT Education - 12/18/14 1208    Education provided Yes   Education Details shoulder anatomy education   Person(s) Educated Patient   Methods Explanation   Comprehension Verbalized understanding          PT Short Term Goals - 11/26/14 1846    PT SHORT TERM GOAL #1   Title STG=LTG           PT Long Term Goals - 12/18/14 0954    PT LONG TERM GOAL #1   Title "Demonstrate and verbalize techniques to reduce the risk of re-injury including: lifting, posture, body mechanics   Time 6   Period  Weeks   Status Achieved   PT LONG TERM GOAL #2   Title "Pt will be independent with advanced HEP.    Time 6   Period Weeks   Status On-going   PT LONG TERM GOAL #3   Title "Pain will decrease to 2/10 or less with all functional activities   Time 6   Period Weeks   Status On-going   PT LONG TERM GOAL #4   Title "Pt will tolerate sitting 1 hour without increased pain to ride in car without increased pain while turning head   Time 6   Period Weeks   Status Achieved   PT LONG TERM GOAL #5   Title "Pt will tolerate working on computer for 2 hours without increased pain/ postural techniques in order to work a full shift   Time 6   Period Weeks   Status Partially Met   PT LONG TERM GOAL #6   Title "FOTO will improve from 68% limitation    to    39% limitation indicating improved functional mobility   Time 6   Period Weeks   Status On-going               Plan - 12/18/14 1209    Clinical Impression Statement Thomas Williamson reports that he felt the traction helped and would like ot see if he could increase the resistance. He met LTG #1, and #4 and partially met #5. He reported decreased tension following instrument assisted STM and manual trigger point release. He reported only mild pulling during his exercises but was able to complete them. increease the traciton weight slightly this visit and he reported no pain following todays session.    PT Next Visit Plan assess traction effect, cont with stab, repeat modailites, check spec goals    Consulted and Agree with Plan of Care Patient        Problem List Patient Active Problem List   Diagnosis Date Noted  . Cervical disc disease 11/20/2014  . Viral gastroenteritis 06/26/2014  . Lipids abnormal 09/29/2013  . Bleeding internal hemorrhoids 09/12/2013  . Uvulitis 11/02/2012  . IBS (irritable bowel syndrome) 02/16/2012  . Hypertension 02/24/2011  . Bipolar depression ([place]) 02/24/2011  . Hernia, inguinal, bilateral 02/24/2011    Starr Lake PT, DPT, LAT, ATC  12/18/2014  12:20 PM    [place] [facility] [address] [place], [place], [address] Phone: [phone]  Fax:  [phone]  Name: Thomas Williamson MRN: [id number] Date of Birth: 03/18/1975    PHYSICAL THERAPY DISCHARGE SUMMARY  Visits from Start of Care: 5  Current functional level related to goals / functional outcomes: See goals met , above   Remaining deficits: Unknown, see above for last visit details.    Education / Equipment: HEP, posture, traction  Plan: Patient agrees to discharge.  Patient goals were partially met. Patient is being discharged due to not returning since the last visit.  ?????    Raeford Razor, PT 01/25/2015 8:20 AM Phone: [phone] Fax: [phone]

## 2014-12-21 ENCOUNTER — Ambulatory Visit: Payer: 59 | Admitting: Physical Therapy

## 2014-12-31 ENCOUNTER — Encounter: Payer: Self-pay | Admitting: Adult Health

## 2014-12-31 ENCOUNTER — Ambulatory Visit (INDEPENDENT_AMBULATORY_CARE_PROVIDER_SITE_OTHER): Payer: 59 | Admitting: Adult Health

## 2014-12-31 DIAGNOSIS — B001 Herpesviral vesicular dermatitis: Secondary | ICD-10-CM

## 2014-12-31 DIAGNOSIS — K529 Noninfective gastroenteritis and colitis, unspecified: Secondary | ICD-10-CM

## 2014-12-31 MED ORDER — VALACYCLOVIR HCL 1 G PO TABS: 1000.0000 mg | ORAL_TABLET | Freq: Two times a day (BID) | ORAL | Status: DC

## 2014-12-31 MED ORDER — ONDANSETRON HCL 4 MG/2ML IJ SOLN
4.0000 mg | INTRAMUSCULAR | Status: AC
  Administered 2014-12-31: 4 mg via INTRAMUSCULAR

## 2014-12-31 MED ORDER — ONDANSETRON HCL 4 MG PO TABS: 4.0000 mg | ORAL_TABLET | Freq: Three times a day (TID) | ORAL | Status: DC | PRN

## 2014-12-31 NOTE — Patient Instructions (Addendum)
It was great meeting you today and I am sorry you are feeling horrible.    I have sent in a prescription for Zofran, take as directed for nausea  Use Immodium or Pepto for the upset stomach.   Stay hydrated as well as you can. Drink Gatorade, juice or water.   For your cold sores, I have sent in a prescription for Valtrex. Please take as directed,1 g twice daily for 2 days (separate doses by ~12 hours)   Follow up if no improvement.    Viral Gastroenteritis Viral gastroenteritis is also known as stomach flu. This condition affects the stomach and intestinal tract. It can cause sudden diarrhea and vomiting. The illness typically lasts 3 to 8 days. Most people develop an immune response that eventually gets rid of the virus. While this natural response develops, the virus can make you quite ill. CAUSES  Many different viruses can cause gastroenteritis, such as rotavirus or noroviruses. You can catch one of these viruses by consuming contaminated food or water. You may also catch a virus by sharing utensils or other personal items with an infected person or by touching a contaminated surface. SYMPTOMS  The most common symptoms are diarrhea and vomiting. These problems can cause a severe loss of body fluids (dehydration) and a body salt (electrolyte) imbalance. Other symptoms may include:  Fever.  Headache.  Fatigue.  Abdominal pain. DIAGNOSIS  Your caregiver can usually diagnose viral gastroenteritis based on your symptoms and a physical exam. A stool sample may also be taken to test for the presence of viruses or other infections. TREATMENT  This illness typically goes away on its own. Treatments are aimed at rehydration. The most serious cases of viral gastroenteritis involve vomiting so severely that you are not able to keep fluids down. In these cases, fluids must be given through an intravenous line (IV). HOME CARE INSTRUCTIONS   Drink enough fluids to keep your urine clear or pale  yellow. Drink small amounts of fluids frequently and increase the amounts as tolerated.  Ask your caregiver for specific rehydration instructions.  Avoid:  Foods high in sugar.  Alcohol.  Carbonated drinks.  Tobacco.  Juice.  Caffeine drinks.  Extremely hot or cold fluids.  Fatty, greasy foods.  Too much intake of anything at one time.  Dairy products until 24 to 48 hours after diarrhea stops.  You may consume probiotics. Probiotics are active cultures of beneficial bacteria. They may lessen the amount and number of diarrheal stools in adults. Probiotics can be found in yogurt with active cultures and in supplements.  Wash your hands well to avoid spreading the virus.  Only take over-the-counter or prescription medicines for pain, discomfort, or fever as directed by your caregiver. Do not give aspirin to children. Antidiarrheal medicines are not recommended.  Ask your caregiver if you should continue to take your regular prescribed and over-the-counter medicines.  Keep all follow-up appointments as directed by your caregiver. SEEK IMMEDIATE MEDICAL CARE IF:   You are unable to keep fluids down.  You do not urinate at least once every 6 to 8 hours.  You develop shortness of breath.  You notice blood in your stool or vomit. This may look like coffee grounds.  You have abdominal pain that increases or is concentrated in one small area (localized).  You have persistent vomiting or diarrhea.  You have a fever.  The patient is a child younger than 3 months, and he or she has a fever.  The  patient is a child older than 3 months, and he or she has a fever and persistent symptoms.  The patient is a child older than 3 months, and he or she has a fever and symptoms suddenly get worse.  The patient is a baby, and he or she has no tears when crying. MAKE SURE YOU:   Understand these instructions.  Will watch your condition.  Will get help right away if you are not  doing well or get worse.   This information is not intended to replace advice given to you by your health care provider. Make sure you discuss any questions you have with your health care provider.   Document Released: 01/19/2005 Document Revised: 04/13/2011 Document Reviewed: 11/05/2010 Elsevier Interactive Patient Education [employer].

## 2014-12-31 NOTE — Progress Notes (Signed)
Subjective:    Patient ID: Thomas Williamson, male    DOB: 09-03-1975, 39 y.o.   MRN: [id number]  HPI  39 year old male who presents to the office for cough, congestion, feeling "dehydrated",  body aches, fatigue, diarrhea, decreased appetite, and nausea for three days. His symptoms were sudden on Friday night. Today is the first time he vomited.   Denies any recent travel or antibiotic use. He does work at the [employer] and people there have had the same symptoms.   Has not tried any OTC medications such as Imodium or Pepto.    He has also had a reoccurrence of cold sores and would like to have valtrex   Review of Systems  Constitutional: Positive for fever, activity change, appetite change and fatigue. Negative for chills and unexpected weight change.  HENT: Positive for congestion. Negative for postnasal drip, rhinorrhea and sore throat.   Respiratory: Positive for cough. Negative for chest tightness and shortness of breath.   Cardiovascular: Negative.   Gastrointestinal: Positive for nausea, vomiting and diarrhea. Negative for abdominal pain, constipation, blood in stool, abdominal distention, anal bleeding and rectal pain.  Genitourinary: Negative.   Neurological: Negative.    Past Medical History  Diagnosis Date  . Allergy   . GERD (gastroesophageal reflux disease)   . IBS (irritable bowel syndrome)   . Hypertension   . Anxiety   . PONV (postoperative nausea and vomiting)     Social History   Social History  . Marital Status: Single    Spouse Name: N/A  . Number of Children: N/A  . Years of Education: N/A   Occupational History  . Not on file.   Social History Main Topics  . Smoking status: Never Smoker   . Smokeless tobacco: Never Used  . Alcohol Use: Yes     Comment: Social  . Drug Use: No  . Sexual Activity: Not on file   Other Topics Concern  . Not on file   Social History Narrative    Past Surgical History  Procedure Laterality Date  .  Anterior cruciate ligament repair  2010  . Nose surgery  [id number]  . Inguinal hernia repair  02/23/2012    Procedure: LAPAROSCOPIC BILATERAL INGUINAL HERNIA REPAIR;  Surgeon: Shann Medal, MD;  Location: [facility];  Service: General;  Laterality: Bilateral;  Laparoscopic Bilateral Inguinal Hernia Repair with mesh  . Insertion of mesh  02/23/2012    Procedure: INSERTION OF MESH;  Surgeon: Shann Medal, MD;  Location: [facility];  Service: General;  Laterality: N/A;    Family History  Problem Relation Age of Onset  . Hypertension Mother   . Hypertension Father     Allergies  Allergen Reactions  . Hydrocodone     UPSETS STOMACH    Current Outpatient Prescriptions on File Prior to Visit  Medication Sig Dispense Refill  . gabapentin (NEURONTIN) 600 MG tablet Take 600 mg by mouth 3 (three) times daily.  1  . lamoTRIgine (LAMICTAL) 200 MG tablet Take 200 mg by mouth every evening.     Marland Kitchen losartan-hydrochlorothiazide (HYZAAR) 50-12.5 MG per tablet TAKE 1 TABLET TWICE A DAY 180 tablet 2  . omeprazole (PRILOSEC) 20 MG capsule Take 20 mg by mouth daily.    . pravastatin (PRAVACHOL) 10 MG tablet Take 1 tablet (10 mg total) by mouth daily. 90 tablet 3  . TRINTELLIX 20 MG TABS Take 20 mg by mouth daily.  1  . losartan-hydrochlorothiazide (HYZAAR) 100-12.5 MG  per tablet Take 1 tablet by mouth daily. (Patient not taking: Reported on 12/31/2014) 100 tablet 3   No current facility-administered medications on file prior to visit.    BP 124/84 mmHg  Pulse 96  Temp(Src) 98.3 F (36.8 C) (Oral)  Ht 5' 9.5" (1.765 m)  Wt 182 lb (82.555 kg)  BMI 26.50 kg/m2  SpO2 98%       Objective:   Physical Exam  Constitutional: He is oriented to person, place, and time. He appears well-developed and well-nourished. No distress.  Appears tired and ill  HENT:  Head: Normocephalic and atraumatic.  Right Ear: External ear normal.  Left Ear: External ear normal.  Nose: Nose normal.  Mouth/Throat: Oropharynx  is clear and moist.  Neck: Normal range of motion. Neck supple.  Cardiovascular: Normal rate, regular rhythm, normal heart sounds and intact distal pulses.  Exam reveals no gallop and no friction rub.   No murmur heard. Pulmonary/Chest: Effort normal and breath sounds normal. No respiratory distress. He has no wheezes. He has no rales. He exhibits no tenderness.  Abdominal: Soft. Bowel sounds are normal. He exhibits no distension and no mass. There is no tenderness. There is no rebound and no guarding.  Lymphadenopathy:    He has no cervical adenopathy.  Neurological: He is alert and oriented to person, place, and time.  Skin: Skin is warm. No rash noted. He is diaphoretic. No erythema. There is pallor.  Two large cold sores on upper lip  Psychiatric: He has a normal mood and affect. His behavior is normal. Thought content normal.  Nursing note and vitals reviewed.      Assessment & Plan:  1. Noninfectious gastroenteritis, unspecified - ondansetron (ZOFRAN) 4 MG tablet; Take 1 tablet (4 mg total) by mouth every 8 (eight) hours as needed for nausea or vomiting.  Dispense: 20 tablet; Refill: 0 - ondansetron (ZOFRAN) injection 4 mg; Inject 2 mLs (4 mg total) into the muscle now. - Pepto or Imodium to help with diarrhea - Increase fluids - Rest - Follow up if no improvement.  2. Recurrent cold sores - valACYclovir (VALTREX) 1000 MG tablet; Take 1 tablet (1,000 mg total) by mouth 2 (two) times daily.  Dispense: 20 tablet; Refill: 3

## 2014-12-31 NOTE — Progress Notes (Signed)
Pre visit review using our clinic review tool, if applicable. No additional management support is needed unless otherwise documented below in the visit note. 

## 2015-01-07 ENCOUNTER — Encounter: Payer: Self-pay | Admitting: *Deleted

## 2015-01-07 ENCOUNTER — Ambulatory Visit (INDEPENDENT_AMBULATORY_CARE_PROVIDER_SITE_OTHER): Payer: 59 | Admitting: Family Medicine

## 2015-01-07 ENCOUNTER — Encounter: Payer: Self-pay | Admitting: Family Medicine

## 2015-01-07 VITALS — BP 128/90 | HR 126 | Temp 98.2°F | Wt 175.9 lb

## 2015-01-07 DIAGNOSIS — K529 Noninfective gastroenteritis and colitis, unspecified: Secondary | ICD-10-CM

## 2015-01-07 DIAGNOSIS — A084 Viral intestinal infection, unspecified: Secondary | ICD-10-CM | POA: Diagnosis not present

## 2015-01-07 DIAGNOSIS — R059 Cough, unspecified: Secondary | ICD-10-CM | POA: Insufficient documentation

## 2015-01-07 DIAGNOSIS — R05 Cough: Secondary | ICD-10-CM

## 2015-01-07 MED ORDER — ONDANSETRON HCL 4 MG PO TABS: 4.0000 mg | ORAL_TABLET | Freq: Three times a day (TID) | ORAL | Status: DC | PRN

## 2015-01-07 MED ORDER — HYDROCODONE-HOMATROPINE 5-1.5 MG/5ML PO SYRP: 5.0000 mL | ORAL_SOLUTION | Freq: Three times a day (TID) | ORAL | Status: DC | PRN

## 2015-01-07 NOTE — Progress Notes (Signed)
Pre visit review using our clinic review tool, if applicable. No additional management support is needed unless otherwise documented below in the visit note. 

## 2015-01-07 NOTE — Patient Instructions (Signed)
Rest at home today and tomorrow........ okay to return to work on Wednesday  Clear liquids......Marland Kitchen ginger ale.water ... 7 up .Marland Kitchen... Gatorade ........ no milk milk products or food until Wednesday  Zofran .......Marland Kitchen 1 tablet 3 times daily when necessary for nausea  Hydromet .... 1/2-1 teaspoon at bedtime for nighttime cough .......Marland Kitchen

## 2015-01-07 NOTE — Progress Notes (Signed)
   Subjective:    Patient ID: Thomas Williamson, male    DOB: 02-Sep-1975, 39 y.o.   MRN: [id number]  HPI Thomas Williamson is a 39 year old male nonsmoker who comes in today for evaluation 2 problems  He was seen here week ago with a congestion runny nose and cough. Everything resolved but he still coughing. No sputum production nonsmoker  He felt about better Friday Saturday than on Sunday had 3 episodes of vomiting 5 episodes today. No fever chills no abdominal pain no urinary tract symptoms   Review of Systems Review of systems otherwise negative    Objective:   Physical Exam  Well-developed well-nourished male no acute distress vital signs stable he is afebrile HEENT were negative neck was supple no adenopathy lungs are clear  Abdominal exam flat bowel sounds normal liver spleen and kidneys not enlarged I can appreciate no masses. No tenderness no rebound      Assessment & Plan:  Viral URI.............. treat symptomatically with liquids and cough syrup  Viral gastroenteritis........Marland Kitchen rest at home today and tomorrow....... clear liquids......Marland Kitchen Zofran..... Return to work on Wednesday.

## 2015-01-13 ENCOUNTER — Other Ambulatory Visit: Payer: Self-pay | Admitting: Family Medicine

## 2015-01-16 ENCOUNTER — Ambulatory Visit (INDEPENDENT_AMBULATORY_CARE_PROVIDER_SITE_OTHER): Payer: 59 | Admitting: Family Medicine

## 2015-01-16 VITALS — Temp 97.9°F | Wt 177.0 lb

## 2015-01-16 DIAGNOSIS — S93491A Sprain of other ligament of right ankle, initial encounter: Secondary | ICD-10-CM

## 2015-01-16 DIAGNOSIS — S93431A Sprain of tibiofibular ligament of right ankle, initial encounter: Secondary | ICD-10-CM | POA: Diagnosis not present

## 2015-01-16 HISTORY — DX: Sprain of other ligament of right ankle, initial encounter: S93.491A

## 2015-01-16 NOTE — Patient Instructions (Signed)
Motrin 400 mg twice daily with food  Elevation and ice today Thursday Friday........Marland Kitchen get up to go the bathroom and eat otherwise on the bed of the couch with your leg propped up and ice.  Resume walking on Saturday  Okay to work on Monday

## 2015-01-16 NOTE — Progress Notes (Signed)
Pre visit review using our clinic review tool, if applicable. No additional management support is needed unless otherwise documented below in the visit note. 

## 2015-01-16 NOTE — Progress Notes (Signed)
   Subjective:    Patient ID: Thomas Williamson, male    DOB: 1975/03/26, 39 y.o.   MRN: [id number]  HPI Thomas Williamson is a 39 year old single male nonsmoker who comes in today for evaluation of right lower extremity pain  He said his walking last night and twisted his right ankle. It's been extremely painful he is having difficulty walking.  No history of previous trauma   Review of Systems Review of systems negative    Objective:   Physical Exam  Well-developed well-nourished male no acute distress vital signs stable is afebrile examination right lower extremity shows a intact ankle with some pain on the lateral side above the lateral malleolus consistent with a high ankle sprain      Assessment & Plan:  High ankle sprain...........Marland Kitchen elevation and ice today Thursday Friday.......... okay to back to work next Monday

## 2015-01-29 ENCOUNTER — Encounter: Payer: Self-pay | Admitting: Family Medicine

## 2015-01-29 ENCOUNTER — Ambulatory Visit (INDEPENDENT_AMBULATORY_CARE_PROVIDER_SITE_OTHER): Payer: 59 | Admitting: Family Medicine

## 2015-01-29 VITALS — BP 118/82 | HR 95 | Temp 98.0°F | Ht 69.5 in | Wt 174.0 lb

## 2015-01-29 DIAGNOSIS — J988 Other specified respiratory disorders: Secondary | ICD-10-CM | POA: Diagnosis not present

## 2015-01-29 MED ORDER — AZITHROMYCIN 250 MG PO TABS: ORAL_TABLET | ORAL | Status: DC

## 2015-01-29 MED ORDER — PREDNISONE 20 MG PO TABS: 40.0000 mg | ORAL_TABLET | Freq: Every day | ORAL | Status: DC

## 2015-01-29 MED ORDER — BENZONATATE 100 MG PO CAPS: 100.0000 mg | ORAL_CAPSULE | Freq: Three times a day (TID) | ORAL | Status: DC | PRN

## 2015-01-29 NOTE — Progress Notes (Signed)
Pre visit review using our clinic review tool, if applicable. No additional management support is needed unless otherwise documented below in the visit note. 

## 2015-01-29 NOTE — Progress Notes (Signed)
HPI:  Acute visit for respiratory infection: -started: about 1 week ago, worsening -symptoms:nasal congestion, sore throat, cough, thick mucus, mild SOB, malaise -denies:fever, hemoptysis, NVD, tooth pain -has tried: sudafed -sick contacts/travel/risks: denies flu exposure, tick exposure or or Ebola risks -denies any hx of asthma  ROS: See pertinent positives and negatives per HPI.  Past Medical History  Diagnosis Date  . Allergy   . GERD (gastroesophageal reflux disease)   . IBS (irritable bowel syndrome)   . Hypertension   . Anxiety   . PONV (postoperative nausea and vomiting)     Past Surgical History  Procedure Laterality Date  . Anterior cruciate ligament repair  2010  . Nose surgery  [id number]  . Inguinal hernia repair  02/23/2012    Procedure: LAPAROSCOPIC BILATERAL INGUINAL HERNIA REPAIR;  Surgeon: Shann Medal, MD;  Location: [facility];  Service: General;  Laterality: Bilateral;  Laparoscopic Bilateral Inguinal Hernia Repair with mesh  . Insertion of mesh  02/23/2012    Procedure: INSERTION OF MESH;  Surgeon: Shann Medal, MD;  Location: [facility];  Service: General;  Laterality: N/A;    Family History  Problem Relation Age of Onset  . Hypertension Mother   . Hypertension Father     Social History   Social History  . Marital Status: Single    Spouse Name: N/A  . Number of Children: N/A  . Years of Education: N/A   Social History Main Topics  . Smoking status: Never Smoker   . Smokeless tobacco: Never Used  . Alcohol Use: Yes     Comment: Social  . Drug Use: No  . Sexual Activity: Not Asked   Other Topics Concern  . None   Social History Narrative     Current outpatient prescriptions:  .  gabapentin (NEURONTIN) 600 MG tablet, Take 600 mg by mouth 3 (three) times daily., Disp: , Rfl: 1 .  lamoTRIgine (LAMICTAL) 200 MG tablet, Take 200 mg by mouth every evening. , Disp: , Rfl:  .  losartan-hydrochlorothiazide (HYZAAR) 50-12.5 MG per tablet, TAKE 1  TABLET TWICE A DAY, Disp: 180 tablet, Rfl: 2 .  omeprazole (PRILOSEC) 20 MG capsule, Take 20 mg by mouth daily., Disp: , Rfl:  .  ondansetron (ZOFRAN) 4 MG tablet, Take 1 tablet (4 mg total) by mouth every 8 (eight) hours as needed for nausea or vomiting., Disp: 10 tablet, Rfl: 2 .  pravastatin (PRAVACHOL) 10 MG tablet, TAKE 1 TABLET (10 MG TOTAL) BY MOUTH DAILY., Disp: 90 tablet, Rfl: 3 .  TRINTELLIX 20 MG TABS, Take 20 mg by mouth daily., Disp: , Rfl: 1 .  valACYclovir (VALTREX) 1000 MG tablet, Take 1 tablet (1,000 mg total) by mouth 2 (two) times daily., Disp: 20 tablet, Rfl: 3 .  azithromycin (ZITHROMAX) 250 MG tablet, 2 tabs day 1, then 1 tab daily for 4 more days, Disp: 6 tablet, Rfl: 0 .  benzonatate (TESSALON PERLES) 100 MG capsule, Take 1 capsule (100 mg total) by mouth 3 (three) times daily as needed for cough., Disp: 20 capsule, Rfl: 0 .  predniSONE (DELTASONE) 20 MG tablet, Take 2 tablets (40 mg total) by mouth daily with breakfast., Disp: 8 tablet, Rfl: 0  EXAM:  Filed Vitals:   01/29/15 1617  BP: 118/82  Pulse: 95  Temp: 98 F (36.7 C)    Body mass index is 25.34 kg/(m^2).  GENERAL: vitals reviewed and listed above, alert, oriented, appears well hydrated and in no acute distress  HEENT: atraumatic,  conjunttiva clear, no obvious abnormalities on inspection of external nose and ears, normal appearance of ear canals and TMs, clear nasal congestion, mild post oropharyngeal erythema with PND, no tonsillar edema or exudate, no sinus TTP  NECK: no obvious masses on inspection  LUNGS: diffuse scattered wheeze  CV: HRRR, no peripheral edema  MS: moves all extremities without noticeable abnormality  PSYCH: pleasant and cooperative, no obvious depression or anxiety  ASSESSMENT AND PLAN:  Discussed the following assessment and plan:  Respiratory infection  - We discussed treatment side effects, likely course, antibiotic misuse, transmission, and signs of developing a  serious illness. Opted for abx and prednisone given exam findings, worsening after 1 week of symptoms -of course, we advised to return or notify a doctor immediately if symptoms worsen or persist or new concerns arise.    Patient Instructions  Take the antibiotic (azithromycin) and the steroid (prednisone) as prescribed  Use the cough medication as needed per instructions  Follow up if worsening, new concerns or symptoms persist     Jewelene Mairena R.

## 2015-01-29 NOTE — Patient Instructions (Signed)
Take the antibiotic (azithromycin) and the steroid (prednisone) as prescribed  Use the cough medication as needed per instructions  Follow up if worsening, new concerns or symptoms persist

## 2015-02-20 ENCOUNTER — Encounter: Payer: Self-pay | Admitting: Internal Medicine

## 2015-02-20 ENCOUNTER — Telehealth: Payer: Self-pay | Admitting: Family Medicine

## 2015-02-20 ENCOUNTER — Ambulatory Visit (INDEPENDENT_AMBULATORY_CARE_PROVIDER_SITE_OTHER): Payer: 59 | Admitting: Internal Medicine

## 2015-02-20 VITALS — BP 130/90 | HR 121 | Temp 98.4°F | Resp 20 | Ht 69.5 in | Wt 169.0 lb

## 2015-02-20 DIAGNOSIS — R059 Cough, unspecified: Secondary | ICD-10-CM

## 2015-02-20 DIAGNOSIS — I10 Essential (primary) hypertension: Secondary | ICD-10-CM

## 2015-02-20 DIAGNOSIS — R05 Cough: Secondary | ICD-10-CM | POA: Diagnosis not present

## 2015-02-20 DIAGNOSIS — J4541 Moderate persistent asthma with (acute) exacerbation: Secondary | ICD-10-CM | POA: Diagnosis not present

## 2015-02-20 MED ORDER — HYDROCODONE-HOMATROPINE 5-1.5 MG/5ML PO SYRP: 5.0000 mL | ORAL_SOLUTION | Freq: Four times a day (QID) | ORAL | Status: DC | PRN

## 2015-02-20 MED ORDER — PREDNISONE 10 MG PO TABS: ORAL_TABLET | ORAL | Status: DC

## 2015-02-20 MED ORDER — AZITHROMYCIN 250 MG PO TABS: ORAL_TABLET | ORAL | Status: DC

## 2015-02-20 MED ORDER — ALBUTEROL SULFATE 108 (90 BASE) MCG/ACT IN AEPB: 2.0000 | INHALATION_SPRAY | Freq: Four times a day (QID) | RESPIRATORY_TRACT | Status: DC | PRN

## 2015-02-20 NOTE — Progress Notes (Signed)
Subjective:    Patient ID: Thomas Williamson, male    DOB: 13-Feb-1975, 40 y.o.   MRN: [id number]  HPI  40 year old patient who has essential hypertension.  He was seen on December 27 with a one-week history of increasing cough, chest congestion and wheezing.  He was treated symptomatically and also with a very brief prednisone regimen.  He seemed to be doing better, but had persistent cough.  One week ago, he has developed increasing cough, wheezing, congestion and worsening shortness of breath.  He was unable to work last night due to the above symptoms.  Cough is nonproductive.  No prior history of asthma.  No fever or chills.  Past Medical History  Diagnosis Date  . Allergy   . GERD (gastroesophageal reflux disease)   . IBS (irritable bowel syndrome)   . Hypertension   . Anxiety   . PONV (postoperative nausea and vomiting)     Social History   Social History  . Marital Status: Single    Spouse Name: N/A  . Number of Children: N/A  . Years of Education: N/A   Occupational History  . Not on file.   Social History Main Topics  . Smoking status: Never Smoker   . Smokeless tobacco: Never Used  . Alcohol Use: Yes     Comment: Social  . Drug Use: No  . Sexual Activity: Not on file   Other Topics Concern  . Not on file   Social History Narrative    Past Surgical History  Procedure Laterality Date  . Anterior cruciate ligament repair  2010  . Nose surgery  [id number]  . Inguinal hernia repair  02/23/2012    Procedure: LAPAROSCOPIC BILATERAL INGUINAL HERNIA REPAIR;  Surgeon: Shann Medal, MD;  Location: [facility];  Service: General;  Laterality: Bilateral;  Laparoscopic Bilateral Inguinal Hernia Repair with mesh  . Insertion of mesh  02/23/2012    Procedure: INSERTION OF MESH;  Surgeon: Shann Medal, MD;  Location: [facility];  Service: General;  Laterality: N/A;    Family History  Problem Relation Age of Onset  . Hypertension Mother   . Hypertension Father     Allergies    Allergen Reactions  . Hydrocodone     UPSETS STOMACH    Current Outpatient Prescriptions on File Prior to Visit  Medication Sig Dispense Refill  . gabapentin (NEURONTIN) 600 MG tablet Take 600 mg by mouth 3 (three) times daily.  1  . lamoTRIgine (LAMICTAL) 200 MG tablet Take 200 mg by mouth every evening.     Marland Kitchen losartan-hydrochlorothiazide (HYZAAR) 50-12.5 MG per tablet TAKE 1 TABLET TWICE A DAY 180 tablet 2  . omeprazole (PRILOSEC) 20 MG capsule Take 20 mg by mouth daily.    . ondansetron (ZOFRAN) 4 MG tablet Take 1 tablet (4 mg total) by mouth every 8 (eight) hours as needed for nausea or vomiting. 10 tablet 2  . pravastatin (PRAVACHOL) 10 MG tablet TAKE 1 TABLET (10 MG TOTAL) BY MOUTH DAILY. 90 tablet 3  . TRINTELLIX 20 MG TABS Take 20 mg by mouth daily.  1  . valACYclovir (VALTREX) 1000 MG tablet Take 1 tablet (1,000 mg total) by mouth 2 (two) times daily. 20 tablet 3   No current facility-administered medications on file prior to visit.    BP 130/90 mmHg  Pulse 121  Temp(Src) 98.4 F (36.9 C) (Oral)  Resp 20  Ht 5' 9.5" (1.765 m)  Wt 169 lb (76.658 kg)  BMI 24.61 kg/m2  SpO2 97%     Review of Systems  Constitutional: Positive for activity change, appetite change and fatigue. Negative for fever and chills.  HENT: Negative for congestion, dental problem, ear pain, hearing loss, sore throat, tinnitus, trouble swallowing and voice change.   Eyes: Negative for pain, discharge and visual disturbance.  Respiratory: Positive for cough and shortness of breath. Negative for chest tightness, wheezing and stridor.   Cardiovascular: Negative for chest pain, palpitations and leg swelling.  Gastrointestinal: Negative for nausea, vomiting, abdominal pain, diarrhea, constipation, blood in stool and abdominal distention.  Genitourinary: Negative for urgency, hematuria, flank pain, discharge, difficulty urinating and genital sores.  Musculoskeletal: Negative for myalgias, back pain, joint  swelling, arthralgias, gait problem and neck stiffness.  Skin: Negative for rash.  Neurological: Negative for dizziness, syncope, speech difficulty, weakness, numbness and headaches.  Hematological: Negative for adenopathy. Does not bruise/bleed easily.  Psychiatric/Behavioral: Negative for behavioral problems and dysphoric mood. The patient is not nervous/anxious.        Objective:   Physical Exam  Constitutional: He is oriented to person, place, and time. He appears well-developed. No distress.  Comfortable at rest Pulse rate 120, after ambulation to the examining room  O2 saturation 97  HENT:  Head: Normocephalic.  Right Ear: External ear normal.  Left Ear: External ear normal.  Mild erythema of the oropharynx  Eyes: Conjunctivae and EOM are normal.  Neck: Normal range of motion.  Cardiovascular: Normal rate and normal heart sounds.   Pulmonary/Chest: Effort normal. No respiratory distress. He has wheezes.  Coarse rhonchi and wheezing.  Involving the lower 1 half lung fields  Abdominal: Bowel sounds are normal.  Musculoskeletal: Normal range of motion. He exhibits no edema or tenderness.  Neurological: He is alert and oriented to person, place, and time.  Psychiatric: He has a normal mood and affect. His behavior is normal.          Assessment & Plan:   Asthmatic bronchitis.  Will treat with azithromycin and a prednisone Dosepak.  We'll continue expectorants Hypertension  Note written to be out of work until Saturday, January 21  Patient has requested hydrocodone cough elixir.  His chart is marked allergic to hydrocodone, but he had some constipation issues with oral Vicodin but has benefited from hydrocodone-based cough elixir in the past

## 2015-02-20 NOTE — Patient Instructions (Signed)
Take over-the-counter expectorants and cough medications such as  Mucinex DM.  Call if there is no improvement in 5 to 7 days or if  you develop worsening cough, fever, or new symptoms, such as shortness of breath or chest pain.  Acute Bronchitis Bronchitis is inflammation of the airways that extend from the windpipe into the lungs (bronchi). The inflammation often causes mucus to develop. This leads to a cough, which is the most common symptom of bronchitis.  In acute bronchitis, the condition usually develops suddenly and goes away over time, usually in a couple weeks. Smoking, allergies, and asthma can make bronchitis worse. Repeated episodes of bronchitis may cause further lung problems.  CAUSES Acute bronchitis is most often caused by the same virus that causes a cold. The virus can spread from person to person (contagious) through coughing, sneezing, and touching contaminated objects. SIGNS AND SYMPTOMS   Cough.   Fever.   Coughing up mucus.   Body aches.   Chest congestion.   Chills.   Shortness of breath.   Sore throat.  DIAGNOSIS  Acute bronchitis is usually diagnosed through a physical exam. Your health care provider will also ask you questions about your medical history. Tests, such as chest X-rays, are sometimes done to rule out other conditions.  TREATMENT  Acute bronchitis usually goes away in a couple weeks. Oftentimes, no medical treatment is necessary. Medicines are sometimes given for relief of fever or cough. Antibiotic medicines are usually not needed but may be prescribed in certain situations. In some cases, an inhaler may be recommended to help reduce shortness of breath and control the cough. A cool mist vaporizer may also be used to help thin bronchial secretions and make it easier to clear the chest.  HOME CARE INSTRUCTIONS  Get plenty of rest.   Drink enough fluids to keep your urine clear or pale yellow (unless you have a medical condition that  requires fluid restriction). Increasing fluids may help thin your respiratory secretions (sputum) and reduce chest congestion, and it will prevent dehydration.   Take medicines only as directed by your health care provider.  If you were prescribed an antibiotic medicine, finish it all even if you start to feel better.  Avoid smoking and secondhand smoke. Exposure to cigarette smoke or irritating chemicals will make bronchitis worse. If you are a smoker, consider using nicotine gum or skin patches to help control withdrawal symptoms. Quitting smoking will help your lungs heal faster.   Reduce the chances of another bout of acute bronchitis by washing your hands frequently, avoiding people with cold symptoms, and trying not to touch your hands to your mouth, nose, or eyes.   Keep all follow-up visits as directed by your health care provider.  SEEK MEDICAL CARE IF: Your symptoms do not improve after 1 week of treatment.  SEEK IMMEDIATE MEDICAL CARE IF:  You develop an increased fever or chills.   You have chest pain.   You have severe shortness of breath.  You have bloody sputum.   You develop dehydration.  You faint or repeatedly feel like you are going to pass out.  You develop repeated vomiting.  You develop a severe headache. MAKE SURE YOU:   Understand these instructions.  Will watch your condition.  Will get help right away if you are not doing well or get worse.   This information is not intended to replace advice given to you by your health care provider. Make sure you discuss any questions you  have with your health care provider.   Document Released: 02/27/2004 Document Revised: 02/09/2014 Document Reviewed: 07/12/2012 Elsevier Interactive Patient Education [employer].

## 2015-02-20 NOTE — Telephone Encounter (Signed)
noted 

## 2015-02-20 NOTE — Telephone Encounter (Signed)
Patient Name: Thomas Williamson  DOB: 03-03-1975    Initial Comment Caller states was seen a month ago, dr said he had bronchitis or walking pna, coughing and wheezing   Nurse Assessment  Nurse: Leilani Merl, RN, Heather Date/Time (Eastern Time): 02/20/2015 9:43:53 AM  Confirm and document reason for call. If symptomatic, describe symptoms. You must click the next button to save text entered. ---Caller states was seen a month ago, dr said he had bronchitis or walking pna, coughing and wheezing that started yesterday  Has the patient traveled out of the country within the last 30 days? ---Not Applicable  Does the patient have any new or worsening symptoms? ---Yes  Will a triage be completed? ---Yes  Related visit to physician within the last 2 weeks? ---No  Does the PT have any chronic conditions? (i.e. diabetes, asthma, etc.) ---Yes  List chronic conditions. ---HTN  Is this a behavioral health or substance abuse call? ---No     Guidelines    Guideline Title Affirmed Question Affirmed Notes  Cough - Acute Non-Productive Wheezing is present    Final Disposition User   See Physician within 4 Hours (or PCP triage) Leilani Merl, RN, Heather    Comments  Made appt with Dr. Raliegh Ip at 10:30 am today   Referrals  REFERRED TO PCP OFFICE   Disagree/Comply: Comply

## 2015-02-20 NOTE — Progress Notes (Signed)
Pre visit review using our clinic review tool, if applicable. No additional management support is needed unless otherwise documented below in the visit note. 

## 2015-02-20 NOTE — Telephone Encounter (Signed)
Pt has an appt with Dr Raliegh Ip

## 2015-04-01 ENCOUNTER — Ambulatory Visit (INDEPENDENT_AMBULATORY_CARE_PROVIDER_SITE_OTHER): Payer: 59 | Admitting: Family Medicine

## 2015-04-01 ENCOUNTER — Encounter: Payer: Self-pay | Admitting: Family Medicine

## 2015-04-01 VITALS — BP 130/90 | HR 98 | Temp 97.5°F | Wt 169.6 lb

## 2015-04-01 DIAGNOSIS — J029 Acute pharyngitis, unspecified: Secondary | ICD-10-CM | POA: Diagnosis not present

## 2015-04-01 DIAGNOSIS — R112 Nausea with vomiting, unspecified: Secondary | ICD-10-CM

## 2015-04-01 LAB — POCT INFLUENZA A/B
INFLUENZA A, POC: NEGATIVE
INFLUENZA B, POC: NEGATIVE

## 2015-04-01 LAB — POCT RAPID STREP A (OFFICE): Rapid Strep A Screen: NEGATIVE

## 2015-04-01 MED ORDER — AMOXICILLIN 500 MG PO CAPS: 500.0000 mg | ORAL_CAPSULE | Freq: Two times a day (BID) | ORAL | Status: DC

## 2015-04-01 NOTE — Progress Notes (Signed)
 Subjective:    Patient ID: Thomas Williamson, male    DOB: 12/18/1975, 39 y.o.   MRN: [id number]  HPI  Thomas Williamson is a 39 year old male who presents today with sore throat, nausea with vomiting x 2, and 2 episodes of loose stools which have resolved per patient. Associated symptoms of chills, sweats, and patient report of fever, and rhinitis.  Symptoms started 2 days ago first with an episode of loose stool followed by diminished appetite, sore throat, and nausea. 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 No suspicious food or recent antibiotic use within the last 30 days noted however patient was treated with azithromycin 02/20/15 without any GI symptoms noted.No home treatments used at this time. Patient is drinking sips of water and has had approximately 24 oz today.   Review of Systems  Constitutional: Positive for chills and fatigue. Negative for fever.  HENT: Positive for rhinorrhea and sore throat.   Respiratory: Negative for cough, chest tightness, shortness of breath and wheezing.   Cardiovascular: Negative for chest pain and palpitations.  Gastrointestinal: Positive for nausea, vomiting and diarrhea.       Abdominal cramping only associated with 2 episodes of loose stools. No abdominal pain or diarrhea noted at this time.  Genitourinary: Negative for dysuria and frequency.  Musculoskeletal: Negative for myalgias and arthralgias.  Skin: Negative for rash.  Neurological: Negative for dizziness and headaches.   Past Medical History  Diagnosis Date  . Allergy   . GERD (gastroesophageal reflux disease)   . IBS (irritable bowel syndrome)   . Hypertension   . Anxiety   . PONV (postoperative nausea and vomiting)     Social History   Social History  . Marital Status: Single    Spouse Name: N/A  . Number of Children: N/A  . Years of Education: N/A   Occupational History  . Not on  file.   Social History Main Topics  . Smoking status: Never Smoker   . Smokeless tobacco: Never Used  . Alcohol Use: Yes     Comment: Social  . Drug Use: No  . Sexual Activity: Not on file   Other Topics Concern  . Not on file   Social History Narrative    Past Surgical History  Procedure Laterality Date  . Anterior cruciate ligament repair  2010  . Nose surgery  2000,2012  . Inguinal hernia repair  02/23/2012    Procedure: LAPAROSCOPIC BILATERAL INGUINAL HERNIA REPAIR;  Surgeon: David H Newman, MD;  Location: [facility];  Service: General;  Laterality: Bilateral;  Laparoscopic Bilateral Inguinal Hernia Repair with mesh  . Insertion of mesh  02/23/2012    Procedure: INSERTION OF MESH;  Surgeon: David H Newman, MD;  Location: [facility];  Service: General;  Laterality: N/A;    Family History  Problem Relation Age of Onset  . Hypertension Mother   . Hypertension Father     Allergies  Allergen Reactions  . Hydrocodone     UPSETS STOMACH    Current Outpatient Prescriptions on File Prior to Visit  Medication Sig Dispense Refill  . lamoTRIgine (LAMICTAL) 200 MG tablet Take 200 mg by mouth every evening.     . losartan-hydrochlorothiazide (HYZAAR) 50-12.5 MG per tablet TAKE 1 TABLET TWICE A DAY 180 tablet 2  . omeprazole (PRILOSEC) 20 MG capsule Take 20 mg by mouth daily.    .   ondansetron (ZOFRAN) 4 MG tablet Take 1 tablet (4 mg total) by mouth every 8 (eight) hours as needed for nausea or vomiting. 10 tablet 2  . pravastatin (PRAVACHOL) 10 MG tablet TAKE 1 TABLET (10 MG TOTAL) BY MOUTH DAILY. 90 tablet 3  . predniSONE (DELTASONE) 10 MG tablet 3 tablets twice daily for 2 days, then 2 tablets twice daily for 2 days,  then 1 tablet twice daily for 2 days, then one tablet daily  for 6 days 30 tablet 0  . TRINTELLIX 20 MG TABS Take 20 mg by mouth daily.  1  . valACYclovir (VALTREX) 1000 MG tablet Take 1 tablet (1,000 mg total) by mouth 2 (two) times daily. 20 tablet 3   No current  facility-administered medications on file prior to visit.    BP 130/90 mmHg  Pulse 98  Temp(Src) 97.5 F (36.4 C) (Oral)  Wt 169 lb 9.6 oz (76.93 kg)  SpO2 98%      Objective:   Physical Exam  Constitutional: He is oriented to person, place, and time. He appears well-developed and well-nourished.  HENT:  Mouth/Throat: Oropharyngeal exudate and posterior oropharyngeal erythema present. No posterior oropharyngeal edema.  Mucosal membranes moist.  Eyes: Pupils are equal, round, and reactive to light.  Cardiovascular: Normal rate, regular rhythm and normal heart sounds.   No murmur heard. Pulmonary/Chest: Effort normal and breath sounds normal. He has no wheezes. He has no rales.  Abdominal: Soft. Bowel sounds are normal. He exhibits no distension. There is no tenderness.  Lymphadenopathy:    He has cervical adenopathy.  Neurological: He is alert and oriented to person, place, and time.  Skin: Skin is warm and dry. No rash noted.  Psychiatric: He has a normal mood and affect. His behavior is normal.      Assessment & Plan:  1. Streptococcal sore throat - POCT rapid strep A Negative POCT rapid strep noted. Rx of amoxicillin provided due to all 4 Centor Criteria being met. Report of fever, tonsillar exudate, absence of cough, and anterior cervical adenopathy.  2. Acute pharyngitis, unspecified etiology - POCT Influenza A/B Negative for POCT Influenza A/B. Patient screened due to report of not receiving influenza immunization and employment as an officer in a jail.  3. Nausea and vomiting, intractability of vomiting not specified, unspecified vomiting type Advised patient to refill Zofran that is currently prescribed by his PCP for nausea and continue drinking small amount of water at a time and increase as tolerated.  Advised patient to take Zofran, drink fluids, and eat a bland diet when taking amoxicillin. Advised patient to contact clinic for further evaluation if vomiting  persists, loose stools occur again, symptoms do not improve in 2-3 days, worsen, or fever >101 develops with antibiotic therapy.   Note for work absence provided today. 

## 2015-04-01 NOTE — Patient Instructions (Signed)
Please take medication as directed. Please refill zofran and take for nausea prior to antibiotic therapy. If symptoms do not improve in 2-3 days, worsen, fever >101 develops, or you have new symptoms please contact clinic for further evaluation. Continue to sip water and increase diet slowly as tolerated.    Pharyngitis Pharyngitis is redness, pain, and swelling (inflammation) of your pharynx.  CAUSES  Pharyngitis is usually caused by infection. Most of the time, these infections are from viruses (viral) and are part of a cold. However, sometimes pharyngitis is caused by bacteria (bacterial). Pharyngitis can also be caused by allergies. Viral pharyngitis may be spread from person to person by coughing, sneezing, and personal items or utensils (cups, forks, spoons, toothbrushes). Bacterial pharyngitis may be spread from person to person by more intimate contact, such as kissing.  SIGNS AND SYMPTOMS  Symptoms of pharyngitis include:   Sore throat.   Tiredness (fatigue).   Low-grade fever.   Headache.  Joint pain and muscle aches.  Skin rashes.  Swollen lymph nodes.  Plaque-like film on throat or tonsils (often seen with bacterial pharyngitis). DIAGNOSIS  Your health care provider will ask you questions about your illness and your symptoms. Your medical history, along with a physical exam, is often all that is needed to diagnose pharyngitis. Sometimes, a rapid strep test is done. Other lab tests may also be done, depending on the suspected cause.  TREATMENT  Viral pharyngitis will usually get better in 3-4 days without the use of medicine. Bacterial pharyngitis is treated with medicines that kill germs (antibiotics).  HOME CARE INSTRUCTIONS   Drink enough water and fluids to keep your urine clear or pale yellow.   Only take over-the-counter or prescription medicines as directed by your health care provider:   If you are prescribed antibiotics, make sure you finish them even if  you start to feel better.   Do not take aspirin.   Get lots of rest.   Gargle with 8 oz of salt water ( tsp of salt per 1 qt of water) as often as every 1-2 hours to soothe your throat.   Throat lozenges (if you are not at risk for choking) or sprays may be used to soothe your throat. SEEK MEDICAL CARE IF:   You have large, tender lumps in your neck.  You have a rash.  You cough up green, yellow-brown, or bloody spit. SEEK IMMEDIATE MEDICAL CARE IF:   Your neck becomes stiff.  You drool or are unable to swallow liquids.  You vomit or are unable to keep medicines or liquids down.  You have severe pain that does not go away with the use of recommended medicines.  You have trouble breathing (not caused by a stuffy nose). MAKE SURE YOU:   Understand these instructions.  Will watch your condition.  Will get help right away if you are not doing well or get worse.   This information is not intended to replace advice given to you by your health care provider. Make sure you discuss any questions you have with your health care provider.   Document Released: 01/19/2005 Document Revised: 11/09/2012 Document Reviewed: 09/26/2012 Elsevier Interactive Patient Education [employer].

## 2015-04-01 NOTE — Progress Notes (Signed)
Pre visit review using our clinic review tool, if applicable. No additional management support is needed unless otherwise documented below in the visit note. 

## 2015-04-02 NOTE — Addendum Note (Signed)
Addended by: Delano Metz A on: 04/02/2015 06:09 AM   Modules accepted: Miquel Dunn

## 2015-04-15 ENCOUNTER — Other Ambulatory Visit: Payer: Self-pay | Admitting: Adult Health

## 2015-04-17 ENCOUNTER — Telehealth: Payer: Self-pay | Admitting: Family Medicine

## 2015-04-17 NOTE — Telephone Encounter (Signed)
Pt request refill of the following:     Ondansetron  zofran 4mg  tablet    Pt said he is started nausea again        Phamacy:  [facility]

## 2015-04-19 NOTE — Telephone Encounter (Signed)
Patient will need an office visit. Okay to schedule with a different provider.

## 2015-05-31 ENCOUNTER — Encounter: Payer: Self-pay | Admitting: *Deleted

## 2015-05-31 ENCOUNTER — Encounter: Payer: Self-pay | Admitting: Adult Health

## 2015-05-31 ENCOUNTER — Ambulatory Visit (INDEPENDENT_AMBULATORY_CARE_PROVIDER_SITE_OTHER): Payer: 59 | Admitting: Adult Health

## 2015-05-31 VITALS — BP 120/80 | HR 103 | Temp 97.8°F | Ht 69.0 in | Wt 168.7 lb

## 2015-05-31 DIAGNOSIS — R197 Diarrhea, unspecified: Secondary | ICD-10-CM

## 2015-05-31 DIAGNOSIS — R112 Nausea with vomiting, unspecified: Secondary | ICD-10-CM | POA: Diagnosis not present

## 2015-05-31 MED ORDER — ONDANSETRON HCL 4 MG PO TABS: 4.0000 mg | ORAL_TABLET | Freq: Three times a day (TID) | ORAL | Status: DC | PRN

## 2015-05-31 MED ORDER — DICYCLOMINE HCL 20 MG PO TABS: 20.0000 mg | ORAL_TABLET | Freq: Four times a day (QID) | ORAL | Status: DC

## 2015-05-31 NOTE — Progress Notes (Signed)
   Subjective:    Patient ID: Thomas Williamson, male    DOB: 30-May-1975, 40 y.o.   MRN: [id number]  HPI  40 year old male who presents to the office today for two days of diarrhea. This morning he started vomiting 1 episode). He reports that this morning he noticed bright red blood on the toilet paper.   He denies any fevers, foreign travel, antibiotic use.   He does have abdominal cramping and nausea  Review of Systems  Constitutional: Positive for fatigue. Negative for fever, activity change and appetite change.  HENT: Negative.   Respiratory: Negative.   Gastrointestinal: Positive for nausea, vomiting, abdominal pain, diarrhea and blood in stool. Negative for constipation and rectal pain.  Musculoskeletal: Negative.   Neurological: Negative.   All other systems reviewed and are negative.      Objective:   Physical Exam  Constitutional: He is oriented to person, place, and time. He appears well-developed and well-nourished. No distress.  Cardiovascular: Normal rate, regular rhythm, normal heart sounds and intact distal pulses.  Exam reveals no gallop and no friction rub.   No murmur heard. Pulmonary/Chest: Effort normal and breath sounds normal. No respiratory distress. He has no wheezes. He has no rales. He exhibits no tenderness.  Genitourinary: Rectal exam shows internal hemorrhoid. Rectal exam shows anal tone normal. Guaiac negative stool.  Neurological: He is alert and oriented to person, place, and time.  Skin: Skin is warm and dry. No rash noted. He is not diaphoretic. No erythema.  Psychiatric: He has a normal mood and affect. His behavior is normal. Thought content normal.  Vitals reviewed.     Assessment & Plan:  1. Diarrhea, unspecified type - may be from IBS - dicyclomine (BENTYL) 20 MG tablet; Take 1 tablet (20 mg total) by mouth every 6 (six) hours.  Dispense: 30 tablet; Refill: 3 - Recommendations of diet given  - Follow up with GI doctor - Stay hydrated 2.  Nausea and vomiting, intractability of vomiting not specified, unspecified vomiting type - ondansetron (ZOFRAN) 4 MG tablet; Take 1 tablet (4 mg total) by mouth every 8 (eight) hours as needed for nausea or vomiting.  Dispense: 20 tablet; Refill: 0  Dorothyann Peng, NP

## 2015-05-31 NOTE — Progress Notes (Signed)
Pre visit review using our clinic review tool, if applicable. No additional management support is needed unless otherwise documented below in the visit note. 

## 2015-05-31 NOTE — Patient Instructions (Addendum)
It was great seeing you again  I have sent in a prescription for Zofran and Bentyl, take these as directed.   Also pick up a Pro Biotic at the drug store.   Follow up with your GI doctor   Food Choices to Help Relieve Diarrhea, Adult When you have diarrhea, the foods you eat and your eating habits are very important. Choosing the right foods and drinks can help relieve diarrhea. Also, because diarrhea can last up to 7 days, you need to replace lost fluids and electrolytes (such as sodium, potassium, and chloride) in order to help prevent dehydration.  WHAT GENERAL GUIDELINES DO I NEED TO FOLLOW?  Slowly drink 1 cup (8 oz) of fluid for each episode of diarrhea. If you are getting enough fluid, your urine will be clear or pale yellow.  Eat starchy foods. Some good choices include white rice, white toast, pasta, low-fiber cereal, baked potatoes (without the skin), saltine crackers, and bagels.  Avoid large servings of any cooked vegetables.  Limit fruit to two servings per day. A serving is  cup or 1 small piece.  Choose foods with less than 2 g of fiber per serving.  Limit fats to less than 8 tsp (38 g) per day.  Avoid fried foods.  Eat foods that have probiotics in them. Probiotics can be found in certain dairy products.  Avoid foods and beverages that may increase the speed at which food moves through the stomach and intestines (gastrointestinal tract). Things to avoid include:  High-fiber foods, such as dried fruit, raw fruits and vegetables, nuts, seeds, and whole grain foods.  Spicy foods and high-fat foods.  Foods and beverages sweetened with high-fructose corn syrup, honey, or sugar alcohols such as xylitol, sorbitol, and mannitol. WHAT FOODS ARE RECOMMENDED? Grains White rice. White, [place], or pita breads (fresh or toasted), including plain rolls, buns, or bagels. White pasta. Saltine, soda, or graham crackers. Pretzels. Low-fiber cereal. Cooked cereals made with water  (such as cornmeal, farina, or cream cereals). Plain muffins. Matzo. Melba toast. Zwieback.  Vegetables Potatoes (without the skin). Strained tomato and vegetable juices. Most well-cooked and canned vegetables without seeds. Tender lettuce. Fruits Cooked or canned applesauce, apricots, cherries, fruit cocktail, grapefruit, peaches, pears, or plums. Fresh bananas, apples without skin, cherries, grapes, cantaloupe, grapefruit, peaches, oranges, or plums.  Meat and Other Protein Products Baked or boiled chicken. Eggs. Tofu. Fish. Seafood. Smooth peanut butter. Ground or well-cooked tender beef, ham, veal, lamb, pork, or poultry.  Dairy Plain yogurt, kefir, and unsweetened liquid yogurt. Lactose-free milk, buttermilk, or soy milk. Plain hard cheese. Beverages Sport drinks. Clear broths. Diluted fruit juices (except prune). Regular, caffeine-free sodas such as ginger ale. Water. Decaffeinated teas. Oral rehydration solutions. Sugar-free beverages not sweetened with sugar alcohols. Other Bouillon, broth, or soups made from recommended foods.  The items listed above may not be a complete list of recommended foods or beverages. Contact your dietitian for more options. WHAT FOODS ARE NOT RECOMMENDED? Grains Whole grain, whole wheat, bran, or rye breads, rolls, pastas, crackers, and cereals. Wild or brown rice. Cereals that contain more than 2 g of fiber per serving. Corn tortillas or taco shells. Cooked or dry oatmeal. Granola. Popcorn. Vegetables Raw vegetables. Cabbage, broccoli, Brussels sprouts, artichokes, baked beans, beet greens, corn, kale, legumes, peas, sweet potatoes, and yams. Potato skins. Cooked spinach and cabbage. Fruits Dried fruit, including raisins and dates. Raw fruits. Stewed or dried prunes. Fresh apples with skin, apricots, mangoes, pears, raspberries, and strawberries.  Meat and Other Protein Products Chunky peanut butter. Nuts and seeds. Beans and lentils. Berniece Salines.   Dairy High-fat cheeses. Milk, chocolate milk, and beverages made with milk, such as milk shakes. Cream. Ice cream. Sweets and Desserts Sweet rolls, doughnuts, and sweet breads. Pancakes and waffles. Fats and Oils Butter. Cream sauces. Margarine. Salad oils. Plain salad dressings. Olives. Avocados.  Beverages Caffeinated beverages (such as coffee, tea, soda, or energy drinks). Alcoholic beverages. Fruit juices with pulp. Prune juice. Soft drinks sweetened with high-fructose corn syrup or sugar alcohols. Other Coconut. Hot sauce. Chili powder. Mayonnaise. Gravy. Cream-based or milk-based soups.  The items listed above may not be a complete list of foods and beverages to avoid. Contact your dietitian for more information. WHAT SHOULD I DO IF I BECOME DEHYDRATED? Diarrhea can sometimes lead to dehydration. Signs of dehydration include dark urine and dry mouth and skin. If you think you are dehydrated, you should rehydrate with an oral rehydration solution. These solutions can be purchased at pharmacies, retail stores, or online.  Drink -1 cup (120-240 mL) of oral rehydration solution each time you have an episode of diarrhea. If drinking this amount makes your diarrhea worse, try drinking smaller amounts more often. For example, drink 1-3 tsp (5-15 mL) every 5-10 minutes.  A general rule for staying hydrated is to drink 1-2 L of fluid per day. Talk to your health care provider about the specific amount you should be drinking each day. Drink enough fluids to keep your urine clear or pale yellow.   This information is not intended to replace advice given to you by your health care provider. Make sure you discuss any questions you have with your health care provider.   Document Released: 04/11/2003 Document Revised: 02/09/2014 Document Reviewed: 12/12/2012 Elsevier Interactive Patient Education [employer].

## 2015-07-10 ENCOUNTER — Ambulatory Visit: Payer: 59 | Admitting: Family Medicine

## 2015-07-11 ENCOUNTER — Ambulatory Visit (INDEPENDENT_AMBULATORY_CARE_PROVIDER_SITE_OTHER): Payer: 59 | Admitting: Family Medicine

## 2015-07-11 ENCOUNTER — Encounter: Payer: Self-pay | Admitting: *Deleted

## 2015-07-11 ENCOUNTER — Encounter: Payer: Self-pay | Admitting: Family Medicine

## 2015-07-11 ENCOUNTER — Ambulatory Visit: Payer: 59 | Admitting: Family Medicine

## 2015-07-11 VITALS — BP 140/100 | HR 88 | Temp 97.1°F | Ht 69.0 in | Wt 163.7 lb

## 2015-07-11 DIAGNOSIS — K589 Irritable bowel syndrome without diarrhea: Secondary | ICD-10-CM

## 2015-07-11 DIAGNOSIS — K649 Unspecified hemorrhoids: Secondary | ICD-10-CM

## 2015-07-11 DIAGNOSIS — I10 Essential (primary) hypertension: Secondary | ICD-10-CM

## 2015-07-11 DIAGNOSIS — Z86010 Personal history of colonic polyps: Secondary | ICD-10-CM | POA: Diagnosis not present

## 2015-07-11 DIAGNOSIS — K529 Noninfective gastroenteritis and colitis, unspecified: Secondary | ICD-10-CM

## 2015-07-11 NOTE — Progress Notes (Addendum)
HPI:  Acute visit for:  NVD: -x 2 days -watery diarrhea, 4-5x per day, emesis (non-bilious/non bloody) several times yesterday, none today, occ mild cramps, sub low grade fever -denies: melena, hematochezia, inability to tol PO intake, fevers> 100, focal abd pain, recnet abx or travel -has IBS, hx hemorrhoids, hx colon polyp - reports see [facility] but not happy there and wants referral to [place] -BP elevated - has not been taking his BP meds the last few days - no CP, SOB, DOE, HA, vision changes  ROS: See pertinent positives and negatives per HPI.  Past Medical History  Diagnosis Date  . Allergy   . GERD (gastroesophageal reflux disease)   . IBS (irritable bowel syndrome)   . Hypertension   . Anxiety   . PONV (postoperative nausea and vomiting)     Past Surgical History  Procedure Laterality Date  . Anterior cruciate ligament repair  2010  . Nose surgery  [id number]  . Inguinal hernia repair  02/23/2012    Procedure: LAPAROSCOPIC BILATERAL INGUINAL HERNIA REPAIR;  Surgeon: Shann Medal, MD;  Location: [facility];  Service: General;  Laterality: Bilateral;  Laparoscopic Bilateral Inguinal Hernia Repair with mesh  . Insertion of mesh  02/23/2012    Procedure: INSERTION OF MESH;  Surgeon: Shann Medal, MD;  Location: [facility];  Service: General;  Laterality: N/A;    Family History  Problem Relation Age of Onset  . Hypertension Mother   . Hypertension Father     Social History   Social History  . Marital Status: Single    Spouse Name: N/A  . Number of Children: N/A  . Years of Education: N/A   Social History Main Topics  . Smoking status: Never Smoker   . Smokeless tobacco: Never Used  . Alcohol Use: Yes     Comment: Social  . Drug Use: No  . Sexual Activity: Not Asked   Other Topics Concern  . None   Social History Narrative     Current outpatient prescriptions:  .  dicyclomine (BENTYL) 20 MG tablet, Take 1 tablet (20 mg total) by mouth every 6 (six)  hours., Disp: 30 tablet, Rfl: 3 .  lamoTRIgine (LAMICTAL) 200 MG tablet, Take 200 mg by mouth every evening. , Disp: , Rfl:  .  losartan-hydrochlorothiazide (HYZAAR) 50-12.5 MG per tablet, TAKE 1 TABLET TWICE A DAY, Disp: 180 tablet, Rfl: 2 .  omeprazole (PRILOSEC) 20 MG capsule, Take 20 mg by mouth daily., Disp: , Rfl:  .  ondansetron (ZOFRAN) 4 MG tablet, Take 1 tablet (4 mg total) by mouth every 8 (eight) hours as needed for nausea or vomiting., Disp: 20 tablet, Rfl: 0 .  TRINTELLIX 20 MG TABS, Take 20 mg by mouth daily., Disp: , Rfl: 1 .  valACYclovir (VALTREX) 1000 MG tablet, Take 1 tablet (1,000 mg total) by mouth 2 (two) times daily., Disp: 20 tablet, Rfl: 3 .  pravastatin (PRAVACHOL) 10 MG tablet, TAKE 1 TABLET (10 MG TOTAL) BY MOUTH DAILY., Disp: 90 tablet, Rfl: 3  EXAM:  Filed Vitals:   07/11/15 1121  BP: 140/100  Pulse: 88  Temp: 97.1 F (36.2 C)    Body mass index is 24.16 kg/(m^2).  GENERAL: vitals reviewed and listed above, alert, oriented, appears well hydrated and in no acute distress  HEENT: atraumatic, conjunttiva clear, no obvious abnormalities on inspection of external nose and ears  NECK: no obvious masses on inspection  LUNGS: clear to auscultation bilaterally, no wheezes, rales or  rhonchi, good air movement  ABD: BS+, soft, NTTP, no rebound or guarding  CV: HRRR, no peripheral edema  MS: moves all extremities without noticeable abnormality  PSYCH: pleasant and cooperative, no obvious depression or anxiety  ASSESSMENT AND PLAN:  Discussed the following assessment and plan:  Gastroenteritis  Hx of colonic polyp - Plan: Ambulatory referral to Gastroenterology  IBS (irritable bowel syndrome) - Plan: Ambulatory referral to Gastroenterology  Hemorrhoids, unspecified hemorrhoid type - Plan: Ambulatory referral to Gastroenterology  Essential hypertension  -we discussed possible serious and likely etiologies, workup and treatment, treatment risks and  return precautions, likely viral gastroenteritis. Symptomatic care, PO fluids, return precuations. Referral to [facility] per his request for chronic issues. -advised take BP meds and recheck in 2 weeks -Patient advised to return or notify a doctor immediately if symptoms worsen or persist or new concerns arise.  Patient Instructions  Restart BP medications and recheck BP in 2 weeks  No dairy for 1 week  Plenty of fluids with electrolytes  Imodium as needed per instructions for diarrhea  Zofran per instructions as needed for nausea and vomiting  -We placed a referral for you as discussed. It usually takes about 1-2 weeks to process and schedule this referral. If you have not heard from [name] regarding this appointment in 2 weeks please contact our office.  Please follow up as needed if you have persistent, worsening or new symptoms.     Colin Benton R.

## 2015-07-11 NOTE — Patient Instructions (Addendum)
Restart BP medications and recheck BP in 2 weeks  No dairy for 1 week  Plenty of fluids with electrolytes  Imodium as needed per instructions for diarrhea  Zofran per instructions as needed for nausea and vomiting  -We placed a referral for you as discussed. It usually takes about 1-2 weeks to process and schedule this referral. If you have not heard from [name] regarding this appointment in 2 weeks please contact our office.  Please follow up as needed if you have persistent, worsening or new symptoms.

## 2015-07-11 NOTE — Progress Notes (Signed)
Pre visit review using our clinic review tool, if applicable. No additional management support is needed unless otherwise documented below in the visit note. 

## 2015-07-25 ENCOUNTER — Encounter: Payer: Self-pay | Admitting: Family Medicine

## 2015-07-25 ENCOUNTER — Encounter: Payer: 59 | Admitting: *Deleted

## 2015-07-25 ENCOUNTER — Ambulatory Visit (INDEPENDENT_AMBULATORY_CARE_PROVIDER_SITE_OTHER): Payer: 59 | Admitting: Family Medicine

## 2015-07-25 VITALS — BP 120/90 | HR 88 | Temp 97.7°F | Resp 12 | Ht 69.0 in | Wt 162.0 lb

## 2015-07-25 DIAGNOSIS — I10 Essential (primary) hypertension: Secondary | ICD-10-CM

## 2015-07-25 DIAGNOSIS — K589 Irritable bowel syndrome without diarrhea: Secondary | ICD-10-CM | POA: Diagnosis not present

## 2015-07-25 NOTE — Patient Instructions (Addendum)
A few things to remember from today's visit:   1. IBS (irritable bowel syndrome)  Low residual diet may help.  2. Essential hypertension    Blood pressure goal for most people is less than 140/90.  Elevated blood pressure increases the risk of strokes, heart and kidney disease, and eye problems. Regular physical activity and a healthy diet ,low in salt usually help.  Take medications as instructed. Caution with some over the counter medications as cold medications, dietary products (for weight loss), and Ibuprofen or Aleve (frequent use);all these medications could cause elevation of blood pressure.     No changes in current medications.   Labs needed. Follow in 3-4 months, before if needed.

## 2015-07-25 NOTE — Progress Notes (Signed)
HPI:   [name] is a 40 y.o. male, who is here today to follow on recent office visit: Nausea,vomiting, abdominal pain and HTN.   He was seen on 07/11/15 because 4-5 days of nausea and vomiting associated with abdominal pain and diarrhea. According to pt, he has a "medical condition", chronic, that causes intermittent episodes of abdominal pain, nausea, and vomiting. Hx of IBS-D, has had colonoscopy done and having another one in a few weeks.  Overall he is feeling better, no new symptoms reported. 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 He usually has 5-7 loose stools per day, most are in the early morning.  Occasionally he notes blood on tissue when he has diarrhea exacerbations, reporting Hx of hemorrhoids. Nausea and vomiting resolved, last episode 2 days ago.    Hypertension: Currently he is on Losartan-HCTZ 50-12.5 mg daily. He is taking medications as instructed, no side effects reported.  He has not noted unusual headache, visual changes, exertional chest pain, dyspnea,  focal weakness, or edema.  Lab Results  Component Value Date   CREATININE 0.8 09/20/2013   BUN 13 09/20/2013   NA 138 09/20/2013   K 3.7 09/20/2013   CL 102 09/20/2013   CO2 27 09/20/2013   He does not check his BP at home and has not followed in 2 years.     Review of Systems  Constitutional: Negative for fever, activity change, appetite change, fatigue and unexpected weight change.  HENT: Negative for nosebleeds, sore throat and trouble swallowing.   Eyes: Negative for redness and visual disturbance.  Respiratory: Negative for apnea, cough, shortness of breath and wheezing.   Cardiovascular: Negative for chest pain, palpitations and leg swelling.  Gastrointestinal: Positive for diarrhea. Negative for nausea, vomiting and abdominal pain.  Genitourinary: Negative for dysuria, hematuria and decreased urine volume.  Neurological: Negative for dizziness,  seizures, syncope, weakness, numbness and headaches.  Hematological: Negative for adenopathy. Does not bruise/bleed easily.  Psychiatric/Behavioral: Negative for confusion. The patient is not nervous/anxious.       Current Outpatient Prescriptions on File Prior to Visit  Medication Sig Dispense Refill  . dicyclomine (BENTYL) 20 MG tablet Take 1 tablet (20 mg total) by mouth every 6 (six) hours. 30 tablet 3  . lamoTRIgine (LAMICTAL) 200 MG tablet Take 200 mg by mouth every evening.     Marland Kitchen losartan-hydrochlorothiazide (HYZAAR) 50-12.5 MG per tablet TAKE 1 TABLET TWICE A DAY 180 tablet 2  . omeprazole (PRILOSEC) 20 MG capsule Take 20 mg by mouth daily.    . ondansetron (ZOFRAN) 4 MG tablet Take 1 tablet (4 mg total) by mouth every 8 (eight) hours as needed for nausea or vomiting. 20 tablet 0  . pravastatin (PRAVACHOL) 10 MG tablet TAKE 1 TABLET (10 MG TOTAL) BY MOUTH DAILY. 90 tablet 3  . TRINTELLIX 20 MG TABS Take 20 mg by mouth daily.  1  . valACYclovir (VALTREX) 1000 MG tablet Take 1 tablet (1,000 mg total) by mouth 2 (two) times daily. 20 tablet 3   No current facility-administered medications on file prior to visit.     Past Medical History  Diagnosis Date  . Allergy   . GERD (gastroesophageal reflux disease)   . IBS (irritable bowel syndrome)   . Hypertension   . Anxiety   . PONV (postoperative nausea and vomiting)    Allergies  Allergen Reactions  . Hydrocodone     UPSETS STOMACH  Social History   Social History  . Marital Status: Single    Spouse Name: N/A  . Number of Children: N/A  . Years of Education: N/A   Social History Main Topics  . Smoking status: Never Smoker   . Smokeless tobacco: Never Used  . Alcohol Use: Yes     Comment: Social  . Drug Use: No  . Sexual Activity: Not Asked   Other Topics Concern  . None   Social History Narrative    Filed Vitals:   07/25/15 1338  BP: 120/90  Pulse: 88  Temp: 97.7 F (36.5 C)  Resp: 12   Body mass  index is 23.91 kg/(m^2).    Physical Exam  Constitutional: He is oriented to person, place, and time. He appears well-developed and well-nourished. No distress.  HENT:  Head: Atraumatic.  Mouth/Throat: Oropharynx is clear and moist. Mucous membranes are dry.  Eyes: Conjunctivae and EOM are normal. Pupils are equal, round, and reactive to light.  Neck: No tracheal deviation present.  ? Enlarged thyroid.  Cardiovascular: Normal rate and regular rhythm.   No murmur heard. Pulses:      Dorsalis pedis pulses are 2+ on the right side, and 2+ on the left side.  Respiratory: Effort normal and breath sounds normal. No respiratory distress.  GI: Soft. Bowel sounds are normal. He exhibits no mass. There is no hepatomegaly. There is no tenderness.  Musculoskeletal: He exhibits no edema.  Lymphadenopathy:    He has no cervical adenopathy.  Neurological: He is alert and oriented to person, place, and time. He has normal strength.  Skin: Skin is warm. No erythema.  Psychiatric: He has a normal mood and affect.  Well groomed, good eye contact.      ASSESSMENT AND PLAN:     Mead was seen today for stomach issues.  Diagnoses and all orders for this visit:  IBS (irritable bowel syndrome)   Acute exacerbation resolved and he is reporting being back to his baseline. Low residual diet may help. Adequate hydration, has not had a BMP since 2015. Some of his meds may aggravate problem: PPI and Triantellix. Continue following with GI.  -     Basic metabolic panel; Future -     TSH; Future  Essential hypertension  DBP slightly elevated. No changes in current management. Low salt diet recommended. Monitor at home if possible. Eye exam recommended annually. F/U in  3 months, before if needed.    -     Basic metabolic panel; Future -     TSH; Future        -He has no time for labs today,o will come next week. -Letter for work given.    -He was advised to return or notify  a doctor immediately if symptoms worsen or persist or new concerns arise, he voices understanding.        Aubrianna Orchard G. Martinique, MD  [facility]. [place] office.

## 2015-07-25 NOTE — Addendum Note (Signed)
Addended by: Martinique, Jonavon Trieu G on: 07/25/2015 09:22 PM   Modules accepted: Level of Service

## 2015-07-25 NOTE — Progress Notes (Signed)
Pre visit review using our clinic review tool, if applicable. No additional management support is needed unless otherwise documented below in the visit note. 

## 2015-08-16 ENCOUNTER — Encounter: Payer: Self-pay | Admitting: Family Medicine

## 2015-08-16 ENCOUNTER — Ambulatory Visit (INDEPENDENT_AMBULATORY_CARE_PROVIDER_SITE_OTHER): Payer: 59 | Admitting: Internal Medicine

## 2015-08-16 ENCOUNTER — Encounter: Payer: Self-pay | Admitting: Internal Medicine

## 2015-08-16 VITALS — BP 122/94 | Temp 97.7°F | Wt 159.4 lb

## 2015-08-16 DIAGNOSIS — J0100 Acute maxillary sinusitis, unspecified: Secondary | ICD-10-CM | POA: Diagnosis not present

## 2015-08-16 DIAGNOSIS — J069 Acute upper respiratory infection, unspecified: Secondary | ICD-10-CM | POA: Diagnosis not present

## 2015-08-16 DIAGNOSIS — Z8781 Personal history of (healed) traumatic fracture: Secondary | ICD-10-CM

## 2015-08-16 DIAGNOSIS — Z9889 Other specified postprocedural states: Secondary | ICD-10-CM

## 2015-08-16 MED ORDER — AMOXICILLIN-POT CLAVULANATE 875-125 MG PO TABS: 1.0000 | ORAL_TABLET | Freq: Two times a day (BID) | ORAL | Status: DC

## 2015-08-16 NOTE — Progress Notes (Signed)
Pre visit review using our clinic review tool, if applicable. No additional management support is needed unless otherwise documented below in the visit note.  Chief Complaint  Patient presents with  . Cough    X2Days  . Sinus Pain/Pressure  . Sore Throat  . Nasal Congestion  . Nausea  . Fatigue    HPI: Thomas Moots 40 y.o.   sda pcp NA today  Onset 2 days of  Above sx  with sinus congestion with fatigue and face pressure  No fever some cough no vomiting wheezing shortness of breath   No tobacco  No  hx of  Asthma  But had sinusitis as a child   Also has had 4 nasal fractures and surgery last in [place]  Still has some left nasal obstruction flow but no recent sinusitis. Works in Art gallery manager   ROS: See pertinent positives and negatives per HPI. No specific   Past Medical History  Diagnosis Date  . Allergy   . GERD (gastroesophageal reflux disease)   . IBS (irritable bowel syndrome)   . Hypertension   . Anxiety   . PONV (postoperative nausea and vomiting)     Family History  Problem Relation Age of Onset  . Hypertension Mother   . Hypertension Father     Social History   Social History  . Marital Status: Single    Spouse Name: N/A  . Number of Children: N/A  . Years of Education: N/A   Social History Main Topics  . Smoking status: Never Smoker   . Smokeless tobacco: Never Used  . Alcohol Use: Yes     Comment: Social  . Drug Use: No  . Sexual Activity: Not Asked   Other Topics Concern  . None   Social History Narrative    Outpatient Prescriptions Prior to Visit  Medication Sig Dispense Refill  . dicyclomine (BENTYL) 20 MG tablet Take 1 tablet (20 mg total) by mouth every 6 (six) hours. 30 tablet 3  . lamoTRIgine (LAMICTAL) 200 MG tablet Take 200 mg by mouth every evening.     Marland Kitchen losartan-hydrochlorothiazide (HYZAAR) 50-12.5 MG per tablet TAKE 1 TABLET TWICE A DAY 180 tablet 2  . omeprazole (PRILOSEC) 20 MG capsule Take 20 mg by mouth daily.    .  pravastatin (PRAVACHOL) 10 MG tablet TAKE 1 TABLET (10 MG TOTAL) BY MOUTH DAILY. 90 tablet 3  . TRINTELLIX 20 MG TABS Take 20 mg by mouth daily.  1  . ondansetron (ZOFRAN) 4 MG tablet Take 1 tablet (4 mg total) by mouth every 8 (eight) hours as needed for nausea or vomiting. (Patient not taking: Reported on 08/16/2015) 20 tablet 0  . valACYclovir (VALTREX) 1000 MG tablet Take 1 tablet (1,000 mg total) by mouth 2 (two) times daily. (Patient not taking: Reported on 08/16/2015) 20 tablet 3   No facility-administered medications prior to visit.     EXAM:  BP 122/94 mmHg  Temp(Src) 97.7 F (36.5 C) (Oral)  Wt 159 lb 6.4 oz (72.303 kg)  Body mass index is 23.53 kg/(m^2).  GENERAL: vitals reviewed and listed above, alert, oriented, appears well hydrated and in no acute distress very congested looks allergic  HEENT: atraumatic, conjunctiva  clear, no obvious abnormalities on inspection of external nose and ears  Nares 2+ congetsted  Right maxilla tenderness OP : no lesion edema or exudate  NECK: no obvious masses on inspection palpation  LUNGS: clear to auscultation bilaterally, no wheezes, rales or rhonchi,  dry cough  CV: HRRR, no clubbing cyanosis or   nl cap refill  MS: moves all extremities without noticeable focal  abnormality PSYCH: pleasant and cooperative, no obvious depression or anxiety  ASSESSMENT AND PLAN:  Discussed the following assessment and plan:  Acute maxillary sinusitis, recurrence not specified  Hx of reduction of nasal fracture  URI, acute antib printed rx  Do not take unless  persistent or progressive or alarm featyrures  Note for work  -Patient advised to return or notify health care team  if symptoms worsen ,persist or new concerns arise.  Patient Instructions    This may be a viral sinusitis   and may need to run its course .  Antibiotic  Will help if bacterial   usually persistence 10 - 14 days  Severe pain unrelieved by decongestant   And local measures.   Or fever .  Add saline nose spray   nasacort of flonase  Nose spray each day . Decongestant as long as blood pressure  Is in reasonable control .  If  Needed can add  The antibiotic for above reasons.   Sinusitis, Adult Sinusitis is redness, soreness, and inflammation of the paranasal sinuses. Paranasal sinuses are air pockets within the bones of your face. They are located beneath your eyes, in the middle of your forehead, and above your eyes. In healthy paranasal sinuses, mucus is able to drain out, and air is able to circulate through them by way of your nose. However, when your paranasal sinuses are inflamed, mucus and air can become trapped. This can allow bacteria and other germs to grow and cause infection. Sinusitis can develop quickly and last only a short time (acute) or continue over a long period (chronic). Sinusitis that lasts for more than 12 weeks is considered chronic. CAUSES Causes of sinusitis include:  Allergies.  Structural abnormalities, such as displacement of the cartilage that separates your nostrils (deviated septum), which can decrease the air flow through your nose and sinuses and affect sinus drainage.  Functional abnormalities, such as when the small hairs (cilia) that line your sinuses and help remove mucus do not work properly or are not present. SIGNS AND SYMPTOMS Symptoms of acute and chronic sinusitis are the same. The primary symptoms are pain and pressure around the affected sinuses. Other symptoms include:  Upper toothache.  Earache.  Headache.  Bad breath.  Decreased sense of smell and taste.  A cough, which worsens when you are lying flat.  Fatigue.  Fever.  Thick drainage from your nose, which often is green and may contain pus (purulent).  Swelling and warmth over the affected sinuses. DIAGNOSIS Your health care provider will perform a physical exam. During your exam, your health care provider may perform any of the following to help  determine if you have acute sinusitis or chronic sinusitis:  Look in your nose for signs of abnormal growths in your nostrils (nasal polyps).  Tap over the affected sinus to check for signs of infection.  View the inside of your sinuses using an imaging device that has a light attached (endoscope). If your health care provider suspects that you have chronic sinusitis, one or more of the following tests may be recommended:  Allergy tests.  Nasal culture. A sample of mucus is taken from your nose, sent to a lab, and screened for bacteria.  Nasal cytology. A sample of mucus is taken from your nose and examined by your health care provider to determine if your sinusitis is related to  an allergy. TREATMENT Most cases of acute sinusitis are related to a viral infection and will resolve on their own within 10 days. Sometimes, medicines are prescribed to help relieve symptoms of both acute and chronic sinusitis. These may include pain medicines, decongestants, nasal steroid sprays, or saline sprays. However, for sinusitis related to a bacterial infection, your health care provider will prescribe antibiotic medicines. These are medicines that will help kill the bacteria causing the infection. Rarely, sinusitis is caused by a fungal infection. In these cases, your health care provider will prescribe antifungal medicine. For some cases of chronic sinusitis, surgery is needed. Generally, these are cases in which sinusitis recurs more than 3 times per year, despite other treatments. HOME CARE INSTRUCTIONS  Drink plenty of water. Water helps thin the mucus so your sinuses can drain more easily.  Use a humidifier.  Inhale steam 3-4 times a day (for example, sit in the bathroom with the shower running).  Apply a warm, moist washcloth to your face 3-4 times a day, or as directed by your health care provider.  Use saline nasal sprays to help moisten and clean your sinuses.  Take medicines only as  directed by your health care provider.  If you were prescribed either an antibiotic or antifungal medicine, finish it all even if you start to feel better. SEEK IMMEDIATE MEDICAL CARE IF:  You have increasing pain or severe headaches.  You have nausea, vomiting, or drowsiness.  You have swelling around your face.  You have vision problems.  You have a stiff neck.  You have difficulty breathing.   This information is not intended to replace advice given to you by your health care provider. Make sure you discuss any questions you have with your health care provider.   Document Released: 01/19/2005 Document Revised: 02/09/2014 Document Reviewed: 02/03/2011 Elsevier Interactive Patient Education 2016 [place] K. Panosh M.D.

## 2015-08-16 NOTE — Patient Instructions (Addendum)
This may be a viral sinusitis   and may need to run its course .  Antibiotic  Will help if bacterial   usually persistence 10 - 14 days  Severe pain unrelieved by decongestant   And local measures.  Or fever .  Add saline nose spray   nasacort of flonase  Nose spray each day . Decongestant as long as blood pressure  Is in reasonable control .  If  Needed can add  The antibiotic for above reasons.   Sinusitis, Adult Sinusitis is redness, soreness, and inflammation of the paranasal sinuses. Paranasal sinuses are air pockets within the bones of your face. They are located beneath your eyes, in the middle of your forehead, and above your eyes. In healthy paranasal sinuses, mucus is able to drain out, and air is able to circulate through them by way of your nose. However, when your paranasal sinuses are inflamed, mucus and air can become trapped. This can allow bacteria and other germs to grow and cause infection. Sinusitis can develop quickly and last only a short time (acute) or continue over a long period (chronic). Sinusitis that lasts for more than 12 weeks is considered chronic. CAUSES Causes of sinusitis include:  Allergies.  Structural abnormalities, such as displacement of the cartilage that separates your nostrils (deviated septum), which can decrease the air flow through your nose and sinuses and affect sinus drainage.  Functional abnormalities, such as when the small hairs (cilia) that line your sinuses and help remove mucus do not work properly or are not present. SIGNS AND SYMPTOMS Symptoms of acute and chronic sinusitis are the same. The primary symptoms are pain and pressure around the affected sinuses. Other symptoms include:  Upper toothache.  Earache.  Headache.  Bad breath.  Decreased sense of smell and taste.  A cough, which worsens when you are lying flat.  Fatigue.  Fever.  Thick drainage from your nose, which often is green and may contain pus  (purulent).  Swelling and warmth over the affected sinuses. DIAGNOSIS Your health care provider will perform a physical exam. During your exam, your health care provider may perform any of the following to help determine if you have acute sinusitis or chronic sinusitis:  Look in your nose for signs of abnormal growths in your nostrils (nasal polyps).  Tap over the affected sinus to check for signs of infection.  View the inside of your sinuses using an imaging device that has a light attached (endoscope). If your health care provider suspects that you have chronic sinusitis, one or more of the following tests may be recommended:  Allergy tests.  Nasal culture. A sample of mucus is taken from your nose, sent to a lab, and screened for bacteria.  Nasal cytology. A sample of mucus is taken from your nose and examined by your health care provider to determine if your sinusitis is related to an allergy. TREATMENT Most cases of acute sinusitis are related to a viral infection and will resolve on their own within 10 days. Sometimes, medicines are prescribed to help relieve symptoms of both acute and chronic sinusitis. These may include pain medicines, decongestants, nasal steroid sprays, or saline sprays. However, for sinusitis related to a bacterial infection, your health care provider will prescribe antibiotic medicines. These are medicines that will help kill the bacteria causing the infection. Rarely, sinusitis is caused by a fungal infection. In these cases, your health care provider will prescribe antifungal medicine. For some cases of chronic sinusitis,  surgery is needed. Generally, these are cases in which sinusitis recurs more than 3 times per year, despite other treatments. HOME CARE INSTRUCTIONS  Drink plenty of water. Water helps thin the mucus so your sinuses can drain more easily.  Use a humidifier.  Inhale steam 3-4 times a day (for example, sit in the bathroom with the shower  running).  Apply a warm, moist washcloth to your face 3-4 times a day, or as directed by your health care provider.  Use saline nasal sprays to help moisten and clean your sinuses.  Take medicines only as directed by your health care provider.  If you were prescribed either an antibiotic or antifungal medicine, finish it all even if you start to feel better. SEEK IMMEDIATE MEDICAL CARE IF:  You have increasing pain or severe headaches.  You have nausea, vomiting, or drowsiness.  You have swelling around your face.  You have vision problems.  You have a stiff neck.  You have difficulty breathing.   This information is not intended to replace advice given to you by your health care provider. Make sure you discuss any questions you have with your health care provider.   Document Released: 01/19/2005 Document Revised: 02/09/2014 Document Reviewed: 02/03/2011 Elsevier Interactive Patient Education [employer].

## 2015-09-12 ENCOUNTER — Other Ambulatory Visit: Payer: Self-pay | Admitting: Family Medicine

## 2015-09-12 NOTE — Telephone Encounter (Signed)
Rx refill sent to pharmacy. 

## 2015-09-16 ENCOUNTER — Encounter: Payer: Self-pay | Admitting: Family Medicine

## 2015-09-16 ENCOUNTER — Ambulatory Visit (INDEPENDENT_AMBULATORY_CARE_PROVIDER_SITE_OTHER): Payer: 59 | Admitting: Family Medicine

## 2015-09-16 ENCOUNTER — Encounter: Payer: Self-pay | Admitting: Emergency Medicine

## 2015-09-16 VITALS — BP 110/72 | HR 89 | Temp 98.6°F | Ht 69.0 in | Wt 157.4 lb

## 2015-09-16 DIAGNOSIS — R112 Nausea with vomiting, unspecified: Secondary | ICD-10-CM | POA: Diagnosis not present

## 2015-09-16 DIAGNOSIS — R197 Diarrhea, unspecified: Secondary | ICD-10-CM

## 2015-09-16 MED ORDER — DIPHENOXYLATE-ATROPINE 2.5-0.025 MG PO TABS: 2.0000 | ORAL_TABLET | Freq: Four times a day (QID) | ORAL | 0 refills | Status: DC | PRN

## 2015-09-16 MED ORDER — ONDANSETRON HCL 4 MG PO TABS: 4.0000 mg | ORAL_TABLET | Freq: Three times a day (TID) | ORAL | 0 refills | Status: DC | PRN

## 2015-09-16 NOTE — Progress Notes (Signed)
Pre visit review using our clinic review tool, if applicable. No additional management support is needed unless otherwise documented below in the visit note. 

## 2015-09-16 NOTE — Progress Notes (Signed)
Subjective:    Patient ID: Thomas Williamson, male    DOB: 1975-11-21, 40 y.o.   MRN: [id number]  HPI  Thomas Williamson is a 40 year old male who presents today with loose stools for one day. Reports several episodes possibly up to 8 times over a 24 hour period. Associated symptom of nausea with one episode of vomiting after eating a [place] sandwich.  He denies fever, chills, sweats,  He has a history of IBS which he uses dicyclomine which provides moderate benefit. Treatment with one dicyclomine for this episode has provided limited benefit. He denies melena, hematochezia, abdominal pain, or fever. He reports that he is taking oral fluids. History of hemorrhoids and colon polyp and is followed by GI. He has been seen in this office in April and June 2017 for similar symptoms however he reports that these symptoms came on suddenly and he is not sure if these are related to his history of IBS  Review of Systems  Constitutional: Negative for chills, fatigue and fever.  HENT: Negative for rhinorrhea, sinus pressure and sore throat.   Eyes: Negative for visual disturbance.  Respiratory: Negative for cough, shortness of breath and wheezing.   Cardiovascular: Negative for chest pain and palpitations.  Gastrointestinal: Positive for diarrhea, nausea and vomiting. Negative for abdominal pain, blood in stool and rectal pain.  Genitourinary: Negative for dysuria.  Skin: Negative for rash.  Neurological: Negative for dizziness, light-headedness and headaches.   Past Medical History:  Diagnosis Date  . Allergy   . Anxiety   . GERD (gastroesophageal reflux disease)   . Hypertension   . IBS (irritable bowel syndrome)   . PONV (postoperative nausea and vomiting)      Social History   Social History  . Marital status: Single    Spouse name: N/A  . Number of children: N/A  . Years of education: N/A   Occupational History  . Not on file.   Social History Main Topics  . Smoking status: Never Smoker  .  Smokeless tobacco: Never Used  . Alcohol use Yes     Comment: Social  . Drug use: No  . Sexual activity: Not on file   Other Topics Concern  . Not on file   Social History Narrative  . No narrative on file    Past Surgical History:  Procedure Laterality Date  . ANTERIOR CRUCIATE LIGAMENT REPAIR  2010  . INGUINAL HERNIA REPAIR  02/23/2012   Procedure: LAPAROSCOPIC BILATERAL INGUINAL HERNIA REPAIR;  Surgeon: Shann Medal, MD;  Location: [facility];  Service: General;  Laterality: Bilateral;  Laparoscopic Bilateral Inguinal Hernia Repair with mesh  . INSERTION OF MESH  02/23/2012   Procedure: INSERTION OF MESH;  Surgeon: Shann Medal, MD;  Location: [facility];  Service: General;  Laterality: N/A;  . NOSE SURGERY  2000,2012    Family History  Problem Relation Age of Onset  . Hypertension Mother   . Hypertension Father     Allergies  Allergen Reactions  . Hydrocodone     UPSETS STOMACH    Current Outpatient Prescriptions on File Prior to Visit  Medication Sig Dispense Refill  . dicyclomine (BENTYL) 20 MG tablet Take 1 tablet (20 mg total) by mouth every 6 (six) hours. 30 tablet 3  . lamoTRIgine (LAMICTAL) 200 MG tablet Take 200 mg by mouth every evening.     Marland Kitchen losartan-hydrochlorothiazide (HYZAAR) 50-12.5 MG tablet TAKE 1 TABLET TWICE A DAY 180 tablet 0  . omeprazole (PRILOSEC)  20 MG capsule Take 20 mg by mouth daily.    . pravastatin (PRAVACHOL) 10 MG tablet TAKE 1 TABLET (10 MG TOTAL) BY MOUTH DAILY. 90 tablet 3  . TRINTELLIX 20 MG TABS Take 20 mg by mouth daily.  1  . valACYclovir (VALTREX) 1000 MG tablet Take 1 tablet (1,000 mg total) by mouth 2 (two) times daily. (Patient not taking: Reported on 08/16/2015) 20 tablet 3   No current facility-administered medications on file prior to visit.     BP 110/72 (BP Location: Right Arm, Patient Position: Sitting, Cuff Size: Normal)   Pulse 89   Temp 98.6 F (37 C) (Oral)   Ht 5\' 9"  (1.753 m)   Wt 157 lb 6.4 oz (71.4 kg)   SpO2  97%   BMI 23.24 kg/m        Objective:   Physical Exam  Constitutional: He is oriented to person, place, and time. He appears well-developed and well-nourished.  Eyes: Pupils are equal, round, and reactive to light. No scleral icterus.  Neck: Neck supple.  Cardiovascular: Normal rate and regular rhythm.   Pulmonary/Chest: Effort normal and breath sounds normal. He has no wheezes. He has no rales.  Abdominal: Soft. Bowel sounds are normal. He exhibits no distension. There is no tenderness. There is no rebound and no guarding.  Musculoskeletal: He exhibits no edema.  Lymphadenopathy:    He has no cervical adenopathy.  Neurological: He is alert and oriented to person, place, and time.  Skin: Skin is warm and dry. No rash noted.  Psychiatric: He has a normal mood and affect. His behavior is normal. Judgment and thought content normal.       Assessment & Plan:  1. Diarrhea, unspecified type He has a scheduled appointment with the [facility]. He will have his records transferred from [place].  Advised avoidance of dairy products and encouraged oral fluids, and symptomatic care. Advised patient regarding return precautions.  Lomotil on a limited basis for symptom relief. - diphenoxylate-atropine (LOMOTIL) 2.5-0.025 MG tablet; Take 2 tablets by mouth 4 (four) times daily as needed for diarrhea or loose stools (Maximum 8 tablets and stop after 48 hours if no improvement).  Dispense: 30 tablet; Refill: 0  2. Nausea and vomiting, intractability of vomiting not specified, unspecified vomiting type Zofran for nausea which he uses when needed per his previous GI provider during a flare of his IBS. Advised patient regarding the importance of a GI workup and follow up evaluation based upon his history. - ondansetron (ZOFRAN) 4 MG tablet; Take 1 tablet (4 mg total) by mouth every 8 (eight) hours as needed for nausea or vomiting.  Dispense: 20 tablet; Refill: 0  Strongly recommended GI follow  up as planned and advised him to return for further evaluation if his symptoms do not improve, worsen, or he develops new symptoms.  Delano Metz, FNP-C

## 2015-09-16 NOTE — Patient Instructions (Signed)
Please use medication as directed and follow up with your GI provider as recommended. If symptoms do not improve, worsen, or you develop a fever >101, or cannot keep liquids down, please follow up for further evaluation.  Diarrhea Diarrhea is frequent loose and watery bowel movements. It can cause you to feel weak and dehydrated. Dehydration can cause you to become tired and thirsty, have a dry mouth, and have decreased urination that often is dark yellow. Diarrhea is a sign of another problem, most often an infection that will not last long. In most cases, diarrhea typically lasts 2-3 days. However, it can last longer if it is a sign of something more serious. It is important to treat your diarrhea as directed by your caregiver to lessen or prevent future episodes of diarrhea. CAUSES  Some common causes include:  Gastrointestinal infections caused by viruses, bacteria, or parasites.  Food poisoning or food allergies.  Certain medicines, such as antibiotics, chemotherapy, and laxatives.  Artificial sweeteners and fructose.  Digestive disorders. HOME CARE INSTRUCTIONS  Ensure adequate fluid intake (hydration): Have 1 cup (8 oz) of fluid for each diarrhea episode. Avoid fluids that contain simple sugars or sports drinks, fruit juices, whole milk products, and sodas. Your urine should be clear or pale yellow if you are drinking enough fluids. Hydrate with an oral rehydration solution that you can purchase at pharmacies, retail stores, and online. You can prepare an oral rehydration solution at home by mixing the following ingredients together:   - tsp table salt.   tsp baking soda.   tsp salt substitute containing potassium chloride.  1  tablespoons sugar.  1 L (34 oz) of water.  Certain foods and beverages may increase the speed at which food moves through the gastrointestinal (GI) tract. These foods and beverages should be avoided and include:  Caffeinated and alcoholic  beverages.  High-fiber foods, such as raw fruits and vegetables, nuts, seeds, and whole grain breads and cereals.  Foods and beverages sweetened with sugar alcohols, such as xylitol, sorbitol, and mannitol.  Some foods may be well tolerated and may help thicken stool including:  Starchy foods, such as rice, toast, pasta, low-sugar cereal, oatmeal, grits, baked potatoes, crackers, and bagels.  Bananas.  Applesauce.  Add probiotic-rich foods to help increase healthy bacteria in the GI tract, such as yogurt and fermented milk products.  Wash your hands well after each diarrhea episode.  Only take over-the-counter or prescription medicines as directed by your caregiver.  Take a warm bath to relieve any burning or pain from frequent diarrhea episodes. SEEK IMMEDIATE MEDICAL CARE IF:   You are unable to keep fluids down.  You have persistent vomiting.  You have blood in your stool, or your stools are black and tarry.  You do not urinate in 6-8 hours, or there is only a small amount of very dark urine.  You have abdominal pain that increases or localizes.  You have weakness, dizziness, confusion, or light-headedness.  You have a severe headache.  Your diarrhea gets worse or does not get better.  You have a fever or persistent symptoms for more than 2-3 days.  You have a fever and your symptoms suddenly get worse. MAKE SURE YOU:   Understand these instructions.  Will watch your condition.  Will get help right away if you are not doing well or get worse.   This information is not intended to replace advice given to you by your health care provider. Make sure you discuss any  questions you have with your health care provider.   Document Released: 01/09/2002 Document Revised: 02/09/2014 Document Reviewed: 09/27/2011 Elsevier Interactive Patient Education [employer].

## 2015-09-17 ENCOUNTER — Encounter: Payer: Self-pay | Admitting: Family Medicine

## 2016-01-03 ENCOUNTER — Other Ambulatory Visit: Payer: Self-pay | Admitting: Family Medicine

## 2016-02-08 ENCOUNTER — Other Ambulatory Visit: Payer: Self-pay | Admitting: Family Medicine

## 2016-02-09 ENCOUNTER — Other Ambulatory Visit: Payer: Self-pay | Admitting: Family Medicine

## 2016-02-10 ENCOUNTER — Encounter: Payer: Self-pay | Admitting: Family Medicine

## 2016-02-10 ENCOUNTER — Ambulatory Visit (INDEPENDENT_AMBULATORY_CARE_PROVIDER_SITE_OTHER): Payer: 59 | Admitting: Family Medicine

## 2016-02-10 VITALS — BP 130/90 | HR 99 | Temp 98.3°F | Ht 69.0 in | Wt 178.5 lb

## 2016-02-10 DIAGNOSIS — J069 Acute upper respiratory infection, unspecified: Secondary | ICD-10-CM | POA: Diagnosis not present

## 2016-02-10 DIAGNOSIS — B9789 Other viral agents as the cause of diseases classified elsewhere: Secondary | ICD-10-CM | POA: Diagnosis not present

## 2016-02-10 MED ORDER — HYDROCODONE-HOMATROPINE 5-1.5 MG/5ML PO SYRP: 5.0000 mL | ORAL_SOLUTION | Freq: Three times a day (TID) | ORAL | 0 refills | Status: DC | PRN

## 2016-02-10 NOTE — Patient Instructions (Signed)
Drink lots of water  Tylenol........... 2 tabs 3 times daily  Hydromet..........Marland Kitchen 1/2-1 teaspoon 3 times daily when necessary for cough

## 2016-02-10 NOTE — Progress Notes (Signed)
Thomas Williamson is a 41 year old male nonsmoker comes in with a one-day history of head congestion sore throat and cough. No fever chills  Review of systems otherwise negative  Vs BP 130/90 (BP Location: Left Arm, Patient Position: Sitting, Cuff Size: Normal)   Pulse 99   Temp 98.3 F (36.8 C) (Oral)   Ht 5\' 9"  (1.753 m)   Wt 178 lb 8 oz (81 kg)   BMI 26.36 kg/m . Examination HEENT were negative neck was supple no adenopathy lungs are clear  Impression viral syndrome with cough............ treat symptomatically with liquids water Tylenol and Hydromet cough syrup.Marland Kitchen

## 2016-03-10 ENCOUNTER — Telehealth: Payer: Self-pay | Admitting: Family Medicine

## 2016-03-10 MED ORDER — TRINTELLIX 20 MG PO TABS: 20.0000 mg | ORAL_TABLET | Freq: Every day | ORAL | 3 refills | Status: DC

## 2016-03-10 NOTE — Telephone Encounter (Signed)
The psychiatrist, Dr Caprice Beaver pt sees, has changed her her practice and will no longer accept his insurance.   Pt states his rx TRINTELLIX 20 MG TABS Has no more refills.  Pt would like to know if Dr Sherren Mocha will refill until he can find another dr to prescribe. Pt has been without 2 days now, and was advised not to stop this cold [place].  Pt has not seen Dr Caprice Beaver since May and they will not refill without an appt. But they don't accept his insurance.  Please advise  [facility] [address]  Pt would like a referral to another dr that will prescribe this med and take his insurance

## 2016-03-10 NOTE — Telephone Encounter (Signed)
Rx has been sent to the pharmacy

## 2016-03-11 ENCOUNTER — Telehealth: Payer: Self-pay | Admitting: Family Medicine

## 2016-03-13 NOTE — Telephone Encounter (Signed)
error 

## 2016-03-31 ENCOUNTER — Encounter: Payer: Self-pay | Admitting: Internal Medicine

## 2016-03-31 ENCOUNTER — Encounter: Payer: Self-pay | Admitting: Family Medicine

## 2016-03-31 ENCOUNTER — Ambulatory Visit (INDEPENDENT_AMBULATORY_CARE_PROVIDER_SITE_OTHER): Payer: 59 | Admitting: Internal Medicine

## 2016-03-31 VITALS — BP 116/78 | HR 105 | Temp 97.5°F | Ht 69.0 in | Wt 175.0 lb

## 2016-03-31 DIAGNOSIS — G4726 Circadian rhythm sleep disorder, shift work type: Secondary | ICD-10-CM | POA: Diagnosis not present

## 2016-03-31 DIAGNOSIS — I10 Essential (primary) hypertension: Secondary | ICD-10-CM | POA: Diagnosis not present

## 2016-03-31 DIAGNOSIS — R0989 Other specified symptoms and signs involving the circulatory and respiratory systems: Secondary | ICD-10-CM

## 2016-03-31 DIAGNOSIS — R110 Nausea: Secondary | ICD-10-CM

## 2016-03-31 DIAGNOSIS — R000 Tachycardia, unspecified: Secondary | ICD-10-CM

## 2016-03-31 LAB — COMPREHENSIVE METABOLIC PANEL
ALK PHOS: 58 U/L (ref 39–117)
ALT: 162 U/L — ABNORMAL HIGH (ref 0–53)
AST: 91 U/L — AB (ref 0–37)
Albumin: 4.7 g/dL (ref 3.5–5.2)
BUN: 17 mg/dL (ref 6–23)
CHLORIDE: 102 meq/L (ref 96–112)
CO2: 27 mEq/L (ref 19–32)
CREATININE: 1.03 mg/dL (ref 0.40–1.50)
Calcium: 10.1 mg/dL (ref 8.4–10.5)
GFR: 84.65 mL/min (ref >60.00)
Glucose, Bld: 96 mg/dL (ref 70–99)
Potassium: 4 mEq/L (ref 3.5–5.1)
SODIUM: 140 meq/L (ref 135–145)
TOTAL PROTEIN: 7.7 g/dL (ref 6.0–8.3)
Total Bilirubin: 0.4 mg/dL (ref 0.2–1.2)

## 2016-03-31 LAB — CBC WITH DIFFERENTIAL/PLATELET
BASOS PCT: 0.9 % (ref 0.0–3.0)
Basophils Absolute: 0.1 10*3/uL (ref 0.0–0.1)
EOS ABS: 0.1 10*3/uL (ref 0.0–0.7)
Eosinophils Relative: 1 % (ref 0.0–5.0)
HCT: 34 % — ABNORMAL LOW (ref 39.0–52.0)
Hemoglobin: 11.8 g/dL — ABNORMAL LOW (ref 13.0–17.0)
LYMPHS ABS: 1.9 10*3/uL (ref 0.7–4.0)
Lymphocytes Relative: 18.7 % (ref 12.0–46.0)
MCHC: 34.5 g/dL (ref 30.0–36.0)
MCV: 101 fl — ABNORMAL HIGH (ref 78.0–100.0)
MONO ABS: 0.8 10*3/uL (ref 0.1–1.0)
Monocytes Relative: 7.8 % (ref 3.0–12.0)
NEUTROS ABS: 7.1 10*3/uL (ref 1.4–7.7)
NEUTROS PCT: 71.6 % (ref 43.0–77.0)
PLATELETS: 379 10*3/uL (ref 150.0–400.0)
RBC: 3.37 Mil/uL — ABNORMAL LOW (ref 4.22–5.81)
RDW: 12.2 % (ref 11.5–15.5)
WBC: 9.9 10*3/uL (ref 4.0–10.5)

## 2016-03-31 LAB — LIPID PANEL
Cholesterol: 208 mg/dL — ABNORMAL HIGH (ref 0–200)
HDL: 45.1 mg/dL (ref >39.00)
NonHDL: 163.3
Total CHOL/HDL Ratio: 5
Triglycerides: 252 mg/dL — ABNORMAL HIGH (ref 0.0–149.0)
VLDL: 50.4 mg/dL — ABNORMAL HIGH (ref 0.0–40.0)

## 2016-03-31 LAB — LDL CHOLESTEROL, DIRECT: LDL DIRECT: 102 mg/dL

## 2016-03-31 LAB — TSH: TSH: 0.74 u[IU]/mL (ref 0.35–4.50)

## 2016-03-31 LAB — T4, FREE: Free T4: 0.86 ng/dL (ref 0.60–1.60)

## 2016-03-31 NOTE — Progress Notes (Signed)
Chief Complaint  Patient presents with  . Hypertension    vomiting, light headache, dizzy. Started this morning 188/136    HPI: Thomas Williamson 41 y.o.  sdas    PCP not in office    See above Was sent in from work this morning because he had some nausea and lightheadedness went to the nurse's office and his blood pressure was very high. He had some vomiting and after that felt better. Is given some Zofran and is feeling a bit better. No chest pain shortness of breath syncope did have decreased appetite yesterday Yesterday  Some off stomach     dec appetite  Little diarrhea  .   No fever or chills. Does get some anxiety from where he works but this may be different. No unusual headaches neurologic signs chest pain shortness of breath. 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   Bp at home    Usually  125/90.  Takes his medicine regularly although he does have alternating 12 hour day and night shifts doesn't think he is missing medicines. No tobacco occasional alcohol none recently. No unusual bleeding mouth does feel dry but no change in urinary output edema. He is not on he is not on any decongestants ROS: See pertinent positives and negatives per HPI.  Past Medical History:  Diagnosis Date  . Allergy   . Anxiety   . GERD (gastroesophageal reflux disease)   . Hypertension   . IBS (irritable bowel syndrome)   . PONV (postoperative nausea and vomiting)     Family History  Problem Relation Age of Onset  . Hypertension Mother   . Hypertension Father     Social History   Social History  . Marital status: Single    Spouse name: N/A  . Number of children: N/A  . Years of education: N/A   Social History Main Topics  . Smoking status: Never Smoker  . Smokeless tobacco: Never Used  . Alcohol use Yes     Comment: Social  . Drug use: No  . Sexual activity: Not Asked   Other Topics Concern  . None   Social History Narrative  . None    Outpatient Medications Prior to Visit  Medication Sig  Dispense Refill  . dicyclomine (BENTYL) 20 MG tablet Take 1 tablet (20 mg total) by mouth every 6 (six) hours. 30 tablet 3  . HYDROcodone-homatropine (HYCODAN) 5-1.5 MG/5ML syrup Take 5 mLs by mouth every 8 (eight) hours as needed. 240 mL 0  . lamoTRIgine (LAMICTAL) 200 MG tablet Take 200 mg by mouth every evening.     Marland Kitchen losartan-hydrochlorothiazide (HYZAAR) 50-12.5 MG tablet TAKE 1 TABLET TWICE A DAY 180 tablet 0  . omeprazole (PRILOSEC) 20 MG capsule Take 20 mg by mouth daily.    . ondansetron (ZOFRAN) 4 MG tablet Take 1 tablet (4 mg total) by mouth every 8 (eight) hours as needed for nausea or vomiting. 20 tablet 0  . pravastatin (PRAVACHOL) 10 MG tablet TAKE 1 TABLET (10 MG TOTAL) BY MOUTH DAILY. 90 tablet 2  . TRINTELLIX 20 MG TABS Take 20 mg by mouth daily. 60 tablet 3  . valACYclovir (VALTREX) 1000 MG tablet Take 1 tablet (1,000 mg total) by mouth 2 (two) times daily. 20 tablet 3   No facility-administered medications prior to visit.      EXAM:  BP 116/78 (BP Location: Left Arm)   Pulse (!) 105   Temp 97.5 F (36.4 C) (Oral)   Ht 5\' 9"  (  1.753 m)   Wt 175 lb (79.4 kg)   BMI 25.84 kg/m   Body mass index is 25.84 kg/m.  GENERAL: vitals reviewed and listed above, alert, oriented, appears well hydrated and in no acute distressHe appears a bit sleep deprived but not acutely ill. HEENT: atraumatic, conjunctiva  clear, no obvious abnormalities on inspection of external nose and ears EOMs are clear PERRLA TMs are clear OP : no lesion edema or exudate tongue is midline NECK: no obvious masses on inspection palpation  LUNGS: clear to auscultation bilaterally, no wheezes, rales or rhonchi, good air movement CV: HRRR, no clubbing cyanosis or  peripheral edema nl cap refill no gallops or murmurs heart rate is about 100 and regular. Abdomen soft without organomegaly guarding or rebound skin color non-icteric. MS: moves all extremities without noticeable focal  abnormality PSYCH: pleasant  and cooperative, no obvious depression or anxiety Neurologic appears nonfocal.  ASSESSMENT AND PLAN:  Discussed the following assessment and plan:  Nausea - Plan: CBC with Differential/Platelet, TSH, T4, free, Lipid panel, Comprehensive metabolic panel  Labile hypertension readings - Plan: CBC with Differential/Platelet, TSH, T4, free, Lipid panel, Comprehensive metabolic panel  Essential hypertension - Plan: CBC with Differential/Platelet, TSH, T4, free, Lipid panel, Comprehensive metabolic panel  Heart rate fast 100 range  - Plan: CBC with Differential/Platelet, TSH, T4, free, Lipid panel, Comprehensive metabolic panel  Shifting sleep-work schedule Possible viral illness with secondary elevated blood pressure reading 1. No obvious neurologic signs or rreasoning for  cardiovascular acute signs. Sx better after zofran.  He is way overdue for metabolic monitoring will plan this today. Had fluids and a protein shake this morning water. Otherwise was fasting. Plan blood pressure monitoring rest for the next day or 2 and follow-up visit in 2 weeks with Dr. Sherren Mocha. Encouraged sign up for my chart Can fu if recurring sx   On going   And to fu PCP  -Patient advised to return or notify health care team  if symptoms worsen ,persist or new concerns arise.  Patient Instructions   This acts like an acute illness possibly a viral illness causing nausea and vomiting your blood pressure is stable at this time. You might be mildly dehydrated. We are going to check blood test kidney function potassium blood sugar etc. You're due for these tests.  Rest fluids and see how you do . zofran ok to use if needed.  Can return to work on Friday if feeling better.  Some people have labile hypertension for other reasons. Such as illness  .  Take blood pressure readings twice a day for 10 14 days  AFTER RELAXING  3-5 Min     and then periodically . Make appt with dr Sherren Mocha for 2 weeks from now  Or about   and bring in  your mionitor   To check  To decide if  Any fufther evalutation needed .   BP Readings from Last 3 Encounters:  03/31/16 106/60  02/10/16 130/90  09/16/15 110/72   Wt Readings from Last 3 Encounters:  03/31/16 175 lb (79.4 kg)  02/10/16 178 lb 8 oz (81 kg)  09/16/15 157 lb 6.4 oz (71.4 kg)         Kiel Cockerell K. Glema Takaki M.D.

## 2016-03-31 NOTE — Patient Instructions (Addendum)
This acts like an acute illness possibly a viral illness causing nausea and vomiting your blood pressure is stable at this time. You might be mildly dehydrated. We are going to check blood test kidney function potassium blood sugar etc. You're due for these tests.  Rest fluids and see how you do . zofran ok to use if needed.  Can return to work on Friday if feeling better.  Some people have labile hypertension for other reasons. Such as illness  .  Take blood pressure readings twice a day for 10 14 days  AFTER RELAXING  3-5 Min     and then periodically . Make appt with dr Sherren Mocha for 2 weeks from now  Or about   and bring in your mionitor   To check  To decide if  Any fufther evalutation needed .   BP Readings from Last 3 Encounters:  03/31/16 106/60  02/10/16 130/90  09/16/15 110/72   Wt Readings from Last 3 Encounters:  03/31/16 175 lb (79.4 kg)  02/10/16 178 lb 8 oz (81 kg)  09/16/15 157 lb 6.4 oz (71.4 kg)

## 2016-04-12 ENCOUNTER — Other Ambulatory Visit: Payer: Self-pay | Admitting: Adult Health

## 2016-04-12 DIAGNOSIS — B001 Herpesviral vesicular dermatitis: Secondary | ICD-10-CM

## 2016-04-14 ENCOUNTER — Encounter: Payer: Self-pay | Admitting: Emergency Medicine

## 2016-04-14 NOTE — Telephone Encounter (Signed)
Dr. Todd patient  

## 2016-04-14 NOTE — Telephone Encounter (Signed)
This was last refilled by you 12/31/14 - ok to refill?

## 2016-04-14 NOTE — Telephone Encounter (Signed)
Sent to the pharmacy by e-scribe. 

## 2016-04-16 ENCOUNTER — Telehealth: Payer: Self-pay | Admitting: Emergency Medicine

## 2016-04-16 ENCOUNTER — Telehealth: Payer: Self-pay | Admitting: Family Medicine

## 2016-04-16 NOTE — Telephone Encounter (Signed)
Spoke with pt in regards to lab results from the last OV with Dr. Regis Bill. Also told pt Dr. Regis Bill recommendations on making a follow up visit with Dr. Sherren Mocha and pt states he has appointment scheduled for Monday and has no further questions.

## 2016-04-16 NOTE — Telephone Encounter (Signed)
Thomas Williamson pt returned your call °

## 2016-04-20 ENCOUNTER — Encounter: Payer: Self-pay | Admitting: Family Medicine

## 2016-04-20 ENCOUNTER — Ambulatory Visit (INDEPENDENT_AMBULATORY_CARE_PROVIDER_SITE_OTHER): Payer: 59 | Admitting: Family Medicine

## 2016-04-20 ENCOUNTER — Ambulatory Visit: Payer: 59 | Admitting: Family Medicine

## 2016-04-20 VITALS — BP 118/82 | HR 70 | Temp 97.5°F | Ht 69.0 in | Wt 177.8 lb

## 2016-04-20 DIAGNOSIS — Z7289 Other problems related to lifestyle: Secondary | ICD-10-CM

## 2016-04-20 DIAGNOSIS — Z86010 Personal history of colonic polyps: Secondary | ICD-10-CM | POA: Diagnosis not present

## 2016-04-20 DIAGNOSIS — Z789 Other specified health status: Secondary | ICD-10-CM | POA: Diagnosis not present

## 2016-04-20 DIAGNOSIS — Z1211 Encounter for screening for malignant neoplasm of colon: Secondary | ICD-10-CM

## 2016-04-20 DIAGNOSIS — R030 Elevated blood-pressure reading, without diagnosis of hypertension: Secondary | ICD-10-CM | POA: Diagnosis not present

## 2016-04-20 DIAGNOSIS — R748 Abnormal levels of other serum enzymes: Secondary | ICD-10-CM

## 2016-04-20 NOTE — Progress Notes (Signed)
Pre visit review using our clinic review tool, if applicable. No additional management support is needed unless otherwise documented below in the visit note. 

## 2016-04-20 NOTE — Patient Instructions (Signed)
BEFORE YOU LEAVE: -lab appointment in 2 weeks -follow up: with Dr. Sherren Mocha in 1 month  Please avoid tylenol and alcohol prior to next set up labs. Please seek help if any difficulty cutting back on alcohol use.  We have ordered labs or studies at this visit. It can take up to 1-2 weeks for results and processing. IF results require follow up or explanation, we will call you with instructions. Clinically stable results will be released to your [facility]. If you have not heard from [name] or cannot find your results in [facility] in 2 weeks please contact our office at [phone].  If you are not yet signed up for [facility], please consider signing up.

## 2016-04-20 NOTE — Progress Notes (Signed)
HPI:  Acute visit for elevated liver enzymes. Reports visit with Dr. Regis Bill a few weeks ago when sick (nausea, vomiting, elevated BP.) Very stress as was told had elevated liver enzymes from labs at that visit. Hx alcohol use (4 drinks 3-4 days per week.) Feels can stop or cut back. No further symptoms nausea, vomiting or abd pain.  ROS: See pertinent positives and negatives per HPI.  Past Medical History:  Diagnosis Date  . Allergy   . Anxiety   . GERD (gastroesophageal reflux disease)   . Hypertension   . IBS (irritable bowel syndrome)   . PONV (postoperative nausea and vomiting)     Past Surgical History:  Procedure Laterality Date  . ANTERIOR CRUCIATE LIGAMENT REPAIR  2010  . INGUINAL HERNIA REPAIR  02/23/2012   Procedure: LAPAROSCOPIC BILATERAL INGUINAL HERNIA REPAIR;  Surgeon: Shann Medal, MD;  Location: [facility];  Service: General;  Laterality: Bilateral;  Laparoscopic Bilateral Inguinal Hernia Repair with mesh  . INSERTION OF MESH  02/23/2012   Procedure: INSERTION OF MESH;  Surgeon: Shann Medal, MD;  Location: [facility];  Service: General;  Laterality: N/A;  . NOSE SURGERY  2000,2012    Family History  Problem Relation Age of Onset  . Hypertension Mother   . Hypertension Father     Social History   Social History  . Marital status: Single    Spouse name: N/A  . Number of children: N/A  . Years of education: N/A   Social History Main Topics  . Smoking status: Never Smoker  . Smokeless tobacco: Never Used  . Alcohol use Yes     Comment: Social  . Drug use: No  . Sexual activity: Not Asked   Other Topics Concern  . None   Social History Narrative  . None     Current Outpatient Prescriptions:  .  dicyclomine (BENTYL) 20 MG tablet, Take 1 tablet (20 mg total) by mouth every 6 (six) hours., Disp: 30 tablet, Rfl: 3 .  HYDROcodone-homatropine (HYCODAN) 5-1.5 MG/5ML syrup, Take 5 mLs by mouth every 8 (eight) hours as needed., Disp: 240 mL, Rfl: 0 .   lamoTRIgine (LAMICTAL) 200 MG tablet, Take 200 mg by mouth every evening. , Disp: , Rfl:  .  losartan-hydrochlorothiazide (HYZAAR) 50-12.5 MG tablet, TAKE 1 TABLET TWICE A DAY, Disp: 180 tablet, Rfl: 0 .  omeprazole (PRILOSEC) 20 MG capsule, Take 20 mg by mouth daily., Disp: , Rfl:  .  ondansetron (ZOFRAN) 4 MG tablet, Take 1 tablet (4 mg total) by mouth every 8 (eight) hours as needed for nausea or vomiting., Disp: 20 tablet, Rfl: 0 .  pravastatin (PRAVACHOL) 10 MG tablet, TAKE 1 TABLET (10 MG TOTAL) BY MOUTH DAILY., Disp: 90 tablet, Rfl: 2 .  TRINTELLIX 20 MG TABS, Take 20 mg by mouth daily., Disp: 60 tablet, Rfl: 3 .  valACYclovir (VALTREX) 1000 MG tablet, TAKE 1 TABLET (1,000 MG TOTAL) BY MOUTH 2 (TWO) TIMES DAILY., Disp: 20 tablet, Rfl: 0  EXAM:  Vitals:   04/20/16 1439  BP: 118/82  Pulse: 70  Temp: 97.5 F (36.4 C)    Body mass index is 26.26 kg/m.  GENERAL: vitals reviewed and listed above, alert, oriented, appears well hydrated and in no acute distress  HEENT: atraumatic, conjunttiva clear, no obvious abnormalities on inspection of external nose and ears  NECK: no obvious masses on inspection  LUNGS: clear to auscultation bilaterally, no wheezes, rales or rhonchi, good air movement  CV: HRRR, no peripheral  edema  ABD: Soft, NTTP  MS: moves all extremities without noticeable abnormality  PSYCH: pleasant and cooperative, no obvious depression or anxiety  ASSESSMENT AND PLAN:  Discussed the following assessment and plan:  Elevated liver enzymes - Plan: CBC, Hepatic Function Panel  Alcohol use  Colon cancer screening  Hx of colonic polyp  Elevated blood pressure reading  -BP good -discussed various causes elevated LFTs - had alcohol, viral illness and tylenol at time of labs -advised to cut back or quit alcohol, no tylenol and recheck lfts and cbc in 2 weeks, f/u with PCP in 4 weeks -resources discuss if trouble cutting back on alcohol -he requests referral  to [facility] for colon cancer screening, reports due but used to go to [place] and prefers to see [facility] - referral placed -Patient advised to return or notify a doctor immediately if symptoms worsen or persist or new concerns arise.  Patient Instructions  BEFORE YOU LEAVE: -lab appointment in 2 weeks -follow up: with Dr. Sherren Mocha in 1 month  Please avoid tylenol and alcohol prior to next set up labs. Please seek help if any difficulty cutting back on alcohol use.  We have ordered labs or studies at this visit. It can take up to 1-2 weeks for results and processing. IF results require follow up or explanation, we will call you with instructions. Clinically stable results will be released to your [facility]. If you have not heard from [name] or cannot find your results in [facility] in 2 weeks please contact our office at [phone].  If you are not yet signed up for [facility], please consider signing up.            Colin Benton R., DO

## 2016-05-13 NOTE — Progress Notes (Deleted)
HPI:  Thomas Williamson is here to establish care.  Last PCP and physical:  Has the following chronic problems that require follow up and concerns today:  Follow up lfts/cbc  ROS negative for unless reported above: fevers, unintentional weight loss, hearing or vision loss, chest pain, palpitations, struggling to breath, hemoptysis, melena, hematochezia, hematuria, falls, loc, si, thoughts of self harm  Past Medical History:  Diagnosis Date  . Allergy   . Anxiety   . GERD (gastroesophageal reflux disease)   . Hypertension   . IBS (irritable bowel syndrome)   . PONV (postoperative nausea and vomiting)     Past Surgical History:  Procedure Laterality Date  . ANTERIOR CRUCIATE LIGAMENT REPAIR  2010  . INGUINAL HERNIA REPAIR  02/23/2012   Procedure: LAPAROSCOPIC BILATERAL INGUINAL HERNIA REPAIR;  Surgeon: Shann Medal, MD;  [facility]: [facility];  Service: General;  Laterality: Bilateral;  Laparoscopic Bilateral Inguinal Hernia Repair with mesh  . INSERTION OF MESH  02/23/2012   Procedure: INSERTION OF MESH;  Surgeon: Shann Medal, MD;  [facility];  Service: General;  Laterality: N/A;  . NOSE SURGERY  2000,2012    Family History  Problem Relation Age of Onset  . Hypertension Mother   . Hypertension Father     Social History   Social History  . Marital status: Single    Spouse name: N/A  . Number of children: N/A  . Years of education: N/A   Social History Main Topics  . Smoking status: Never Smoker  . Smokeless tobacco: Never Used  . Alcohol use Yes     Comment: Social  . Drug use: No  . Sexual activity: Not on file   Other Topics Concern  . Not on file   Social History Narrative  . No narrative on file     Current Outpatient Prescriptions:  .  dicyclomine (BENTYL) 20 MG tablet, Take 1 tablet (20 mg total) by mouth every 6 (six) hours., Disp: 30 tablet, Rfl: 3 .  HYDROcodone-homatropine (HYCODAN) 5-1.5 MG/5ML syrup, Take 5 mLs by mouth every 8 (eight) hours  as needed., Disp: 240 mL, Rfl: 0 .  lamoTRIgine (LAMICTAL) 200 MG tablet, Take 200 mg by mouth every evening. , Disp: , Rfl:  .  losartan-hydrochlorothiazide (HYZAAR) 50-12.5 MG tablet, TAKE 1 TABLET TWICE A DAY, Disp: 180 tablet, Rfl: 0 .  omeprazole (PRILOSEC) 20 MG capsule, Take 20 mg by mouth daily., Disp: , Rfl:  .  ondansetron (ZOFRAN) 4 MG tablet, Take 1 tablet (4 mg total) by mouth every 8 (eight) hours as needed for nausea or vomiting., Disp: 20 tablet, Rfl: 0 .  pravastatin (PRAVACHOL) 10 MG tablet, TAKE 1 TABLET (10 MG TOTAL) BY MOUTH DAILY., Disp: 90 tablet, Rfl: 2 .  TRINTELLIX 20 MG TABS, Take 20 mg by mouth daily., Disp: 60 tablet, Rfl: 3 .  valACYclovir (VALTREX) 1000 MG tablet, TAKE 1 TABLET (1,000 MG TOTAL) BY MOUTH 2 (TWO) TIMES DAILY., Disp: 20 tablet, Rfl: 0  EXAM:  There were no vitals filed for this visit.  There is no height or weight on file to calculate BMI.  GENERAL: vitals reviewed and listed above, alert, oriented, appears well hydrated and in no acute distress  HEENT: atraumatic, conjunttiva clear, no obvious abnormalities on inspection of external nose and ears  NECK: no obvious masses on inspection  LUNGS: clear to auscultation bilaterally, no wheezes, rales or rhonchi, good air movement  CV: HRRR, no peripheral edema  MS: moves all  extremities without noticeable abnormality  PSYCH: pleasant and cooperative, no obvious depression or anxiety  ASSESSMENT AND PLAN:  Discussed the following assessment and plan:  No diagnosis found. -We reviewed the PMH, PSH, FH, SH, Meds and Allergies. -We provided refills for any medications we will prescribe as needed. -We addressed current concerns per orders and patient instructions. -We have asked for records for pertinent exams, studies, vaccines and notes from previous providers. -We have advised patient to follow up per instructions below.   -Patient advised to return or notify a doctor immediately if  symptoms worsen or persist or new concerns arise.  There are no Patient Instructions on file for this visit.   Colin Benton R.

## 2016-05-14 ENCOUNTER — Ambulatory Visit: Payer: 59 | Admitting: Family Medicine

## 2016-07-02 ENCOUNTER — Telehealth: Payer: Self-pay | Admitting: *Deleted

## 2016-07-02 ENCOUNTER — Other Ambulatory Visit: Payer: Self-pay | Admitting: Family Medicine

## 2016-07-02 DIAGNOSIS — R748 Abnormal levels of other serum enzymes: Secondary | ICD-10-CM

## 2016-07-02 NOTE — Telephone Encounter (Signed)
-----   Message from Lucretia Kern, DO sent at 07/01/2016  9:28 PM EDT ----- Advise recheck LFTs ----- Message ----- From: SYSTEM Sent: 06/20/2016  12:06 AM To: Lucretia Kern, DO

## 2016-07-02 NOTE — Telephone Encounter (Signed)
I left a detailed message for the pt to call back for a lab appt as he is overdue for testing.  Orders re-entered as the previous orders expired.

## 2016-07-02 NOTE — Telephone Encounter (Signed)
Filled for 6 months until Dr. Sherren Mocha returns.  Last BP check with Dr. Maudie Mercury was good per note.

## 2016-07-08 NOTE — Telephone Encounter (Signed)
Pt scheduled  

## 2016-07-23 ENCOUNTER — Other Ambulatory Visit: Payer: 59

## 2016-10-23 ENCOUNTER — Telehealth: Payer: Self-pay | Admitting: Family Medicine

## 2016-10-23 NOTE — Telephone Encounter (Addendum)
Pt has a new pt/transfer appt to Dr Maudie Mercury on Tuesday sept 25. Caryl Pina with [facility] rehab called to asked if Dr Maudie Mercury will be managing pt's meds.  Caryl Pina faxed me the med list.  Dr Maudie Mercury reviewed and advised she will not be managing his meds.  Dr Maudie Mercury will be happy to manage pt's general health, but not his psych meds. Dr Maudie Mercury recommends pt establish with a psychiatrist.  Also dr Maudie Mercury recommends pt leave their facility with a 3 mo supply of meds to give ample time to get an appt with a psychiatrist.  Eustace Pen and informed her of Dr Julianne Rice message and  to relay to pt.  If pt needs to cancel with Dr Maudie Mercury, that is ok.

## 2016-10-27 ENCOUNTER — Ambulatory Visit (INDEPENDENT_AMBULATORY_CARE_PROVIDER_SITE_OTHER): Payer: 59 | Admitting: Family Medicine

## 2016-10-27 ENCOUNTER — Encounter: Payer: Self-pay | Admitting: Family Medicine

## 2016-10-27 VITALS — BP 110/78 | HR 84 | Temp 98.4°F | Ht 69.0 in | Wt 181.2 lb

## 2016-10-27 DIAGNOSIS — F3130 Bipolar disorder, current episode depressed, mild or moderate severity, unspecified: Secondary | ICD-10-CM

## 2016-10-27 DIAGNOSIS — B1920 Unspecified viral hepatitis C without hepatic coma: Secondary | ICD-10-CM | POA: Diagnosis not present

## 2016-10-27 DIAGNOSIS — I10 Essential (primary) hypertension: Secondary | ICD-10-CM

## 2016-10-27 DIAGNOSIS — F1010 Alcohol abuse, uncomplicated: Secondary | ICD-10-CM | POA: Diagnosis not present

## 2016-10-27 DIAGNOSIS — F319 Bipolar disorder, unspecified: Secondary | ICD-10-CM

## 2016-10-27 NOTE — Progress Notes (Signed)
HPI:  Thomas Williamson is here to establish care. Transferring from Dr. Sherren Mocha.  Has the following chronic problems that require follow up and concerns today:  Hep C: -reports new diagnosis when in rehab in [place] - reports screening done and then confirmed, I do not have these records or labs -reports told needs to see specialist here for treatment -denies any hx IV drug use, blood transfusions, etc -hx elevated LFTs earlier this year during acute viral illness and drinking, was told to follow up for recheck but he did not  HTN: -meds: losartan-hctz -no cp, sob, doe  Alcohol Abuse/Depression: -used to see Dr. Caprice Beaver -recent 30 day stay at [facility] rehab hospital -has follow up with counseling and psychiatry here in [place] -meds: doxepin, tompamax, lithium, trintillex -has not used any alcohol in > 30 days -no SI, thoughts of self harm, relapse since hospitalization  ROS negative for unless reported above: fevers, unintentional weight loss, hearing or vision loss, chest pain, palpitations, struggling to breath, hemoptysis, melena, hematochezia, hematuria, falls, loc, si, thoughts of self harm  Past Medical History:  Diagnosis Date  . Allergy   . Anxiety   . GERD (gastroesophageal reflux disease)   . Hypertension   . IBS (irritable bowel syndrome)   . PONV (postoperative nausea and vomiting)     Past Surgical History:  Procedure Laterality Date  . ANTERIOR CRUCIATE LIGAMENT REPAIR  2010  . INGUINAL HERNIA REPAIR  02/23/2012   Procedure: LAPAROSCOPIC BILATERAL INGUINAL HERNIA REPAIR;  Surgeon: Shann Medal, MD;  Location: [facility];  Service: General;  Laterality: Bilateral;  Laparoscopic Bilateral Inguinal Hernia Repair with mesh  . INSERTION OF MESH  02/23/2012   Procedure: INSERTION OF MESH;  Surgeon: Shann Medal, MD;  Location: [facility];  Service: General;  Laterality: N/A;  . NOSE SURGERY  2000,2012    Family History  Problem Relation Age of Onset  .  Hypertension Mother   . Hypertension Father     Social History   Social History  . Marital status: Single    Spouse name: N/A  . Number of children: N/A  . Years of education: N/A   Social History Main Topics  . Smoking status: Never Smoker  . Smokeless tobacco: Never Used  . Alcohol use Yes     Comment: Social  . Drug use: No  . Sexual activity: Not Asked   Other Topics Concern  . None   Social History Narrative  . None     Current Outpatient Prescriptions:  .  dicyclomine (BENTYL) 20 MG tablet, Take 1 tablet (20 mg total) by mouth every 6 (six) hours., Disp: 30 tablet, Rfl: 3 .  LITHIUM PO, Take by mouth., Disp: , Rfl:  .  losartan-hydrochlorothiazide (HYZAAR) 50-12.5 MG tablet, TAKE 1 TABLET TWICE A DAY, Disp: 180 tablet, Rfl: 1 .  omeprazole (PRILOSEC) 20 MG capsule, Take 20 mg by mouth daily., Disp: , Rfl:  .  ondansetron (ZOFRAN) 4 MG tablet, Take 1 tablet (4 mg total) by mouth every 8 (eight) hours as needed for nausea or vomiting., Disp: 20 tablet, Rfl: 0 .  pravastatin (PRAVACHOL) 10 MG tablet, TAKE 1 TABLET (10 MG TOTAL) BY MOUTH DAILY., Disp: 90 tablet, Rfl: 2 .  TRINTELLIX 20 MG TABS, Take 20 mg by mouth daily., Disp: 60 tablet, Rfl: 3 .  valACYclovir (VALTREX) 1000 MG tablet, TAKE 1 TABLET (1,000 MG TOTAL) BY MOUTH 2 (TWO) TIMES DAILY., Disp: 20 tablet, Rfl: 0  EXAM:  Vitals:   10/27/16 1327  BP: 110/78  Pulse: 84  Temp: 98.4 F (36.9 C)    Body mass index is 26.76 kg/m.  GENERAL: vitals reviewed and listed above, alert, oriented, appears well hydrated and in no acute distress  HEENT: atraumatic, conjunttiva clear, no obvious abnormalities on inspection of external nose and ears  NECK: no obvious masses on inspection  LUNGS: clear to auscultation bilaterally, no wheezes, rales or rhonchi, good air movement  CV: HRRR, no peripheral edema  MS: moves all extremities without noticeable abnormality  PSYCH: pleasant and cooperative, no obvious  depression or anxiety  ASSESSMENT AND PLAN:  Discussed the following assessment and plan:  Hepatitis C virus infection without hepatic coma, unspecified chronicity -advised assistant to obtain records/labs from [name] and then will place hepatology referral -advise avoidance alcohol, tylenol, etc in the interim, handout provided  Essential hypertension -stable  Bipolar depression ([place]) Alcohol abuse -psych management  -offered flu shot  -Patient advised to return or notify a doctor immediately if symptoms worsen or persist or new concerns arise.  Patient Instructions  BEFORE YOU LEAVE: -release records from recent hospital stay -follow up: 3-4 months  Once we get the labs from your recent hospitalization will send referral for the hepatitis.  Follow up with psychiatry for the depression and alcohol use. Seek emergency care if worsening, thoughts of self harm, severe symptoms, etc.   Hepatitis C Hepatitis C is a liver infection. It is caused by a germ that can spread through blood and other bodily fluids. Your doctor will use blood and liver tests to:  Check for this infection.  Decide how to treat you.  Check your health after treatment.  Follow these instructions at home:  Rest.  Do not take any medicine unless your doctor says it is okay. This includes over-the-counter medicine and birth control pills.  Do not drink alcohol.  Do not have sex until your doctor says it is okay.  Do not share toothbrushes, nail clippers, razors, or needles.  Take all medicines as told by your doctor. Contact a doctor if:  You have a fever.  Your belly (abdomen) hurts.  Your pee (urine) is dark.  Your poop (bowel movement) is the color of clay.  You have joint pain. Get help right away if:  You feel more and more tired (fatigued).  You feel more and more weak.  You do not feel like eating.  You feel sick to your stomach (nauseous) or throw up (vomit).  Your  skin or the whites of your eyes turn yellow (jaundice) or turn more yellow than they were before.  You bruise or bleed easily. This information is not intended to replace advice given to you by your health care provider. Make sure you discuss any questions you have with your health care provider. Document Released: 01/02/2008 Document Revised: 06/27/2015 Document Reviewed: 05/03/2013 Elsevier Interactive Patient Education  2017 [place], [place]

## 2016-10-27 NOTE — Patient Instructions (Signed)
BEFORE YOU LEAVE: -release records from recent hospital stay -follow up: 3-4 months  Once we get the labs from your recent hospitalization will send referral for the hepatitis.  Follow up with psychiatry for the depression and alcohol use. Seek emergency care if worsening, thoughts of self harm, severe symptoms, etc.   Hepatitis C Hepatitis C is a liver infection. It is caused by a germ that can spread through blood and other bodily fluids. Your doctor will use blood and liver tests to:  Check for this infection.  Decide how to treat you.  Check your health after treatment.  Follow these instructions at home:  Rest.  Do not take any medicine unless your doctor says it is okay. This includes over-the-counter medicine and birth control pills.  Do not drink alcohol.  Do not have sex until your doctor says it is okay.  Do not share toothbrushes, nail clippers, razors, or needles.  Take all medicines as told by your doctor. Contact a doctor if:  You have a fever.  Your belly (abdomen) hurts.  Your pee (urine) is dark.  Your poop (bowel movement) is the color of clay.  You have joint pain. Get help right away if:  You feel more and more tired (fatigued).  You feel more and more weak.  You do not feel like eating.  You feel sick to your stomach (nauseous) or throw up (vomit).  Your skin or the whites of your eyes turn yellow (jaundice) or turn more yellow than they were before.  You bruise or bleed easily. This information is not intended to replace advice given to you by your health care provider. Make sure you discuss any questions you have with your health care provider. Document Released: 01/02/2008 Document Revised: 06/27/2015 Document Reviewed: 05/03/2013 Elsevier Interactive Patient Education  2017 [employer].

## 2016-11-06 ENCOUNTER — Telehealth: Payer: Self-pay | Admitting: *Deleted

## 2016-11-06 DIAGNOSIS — B1920 Unspecified viral hepatitis C without hepatic coma: Secondary | ICD-10-CM

## 2016-11-06 NOTE — Telephone Encounter (Signed)
Dr Maudie Mercury received lab test results from [facility] and advised an urgent referral be placed for hepatology.  The referral was placed and I called the pt and left a detailed message stating this and someone will call him with appt information.  A copy of the results were given to [facility] for the referral and sent to be scanned.

## 2016-11-26 ENCOUNTER — Telehealth: Payer: Self-pay | Admitting: Family Medicine

## 2016-11-26 NOTE — Telephone Encounter (Signed)
Patient called to state that the quickest [facility] could get him in is November 8th. He States having an appointment but would like to be given enough of the following medication to get him to November 8th. Patient would like a call back.   topiramate Lithium

## 2016-11-27 NOTE — Telephone Encounter (Signed)
I left a message for the pt to return my call. 

## 2016-11-27 NOTE — Telephone Encounter (Signed)
Patient called back and I informed him of the message below and he stated he will call back if needed.

## 2016-11-27 NOTE — Telephone Encounter (Signed)
I would recommend he contact the rehab center he was discharge from regarding this bridge first. If they are unable or unwilling to prescribe him enough to get him to his psychiatry follow up and he is doing well without any concerns, then we can send 14 days of these medications as a bridge. We would need to know what dose of each medication he is taking.

## 2016-12-08 ENCOUNTER — Other Ambulatory Visit: Payer: Self-pay | Admitting: Nurse Practitioner

## 2016-12-08 DIAGNOSIS — B182 Chronic viral hepatitis C: Secondary | ICD-10-CM

## 2016-12-10 ENCOUNTER — Encounter (INDEPENDENT_AMBULATORY_CARE_PROVIDER_SITE_OTHER): Payer: Self-pay

## 2016-12-10 ENCOUNTER — Encounter (HOSPITAL_COMMUNITY): Payer: Self-pay | Admitting: Psychiatry

## 2016-12-10 ENCOUNTER — Ambulatory Visit (HOSPITAL_COMMUNITY): Payer: 59 | Admitting: Psychiatry

## 2016-12-10 VITALS — BP 126/80 | HR 102 | Ht 69.0 in | Wt 186.2 lb

## 2016-12-10 DIAGNOSIS — F1021 Alcohol dependence, in remission: Secondary | ICD-10-CM | POA: Diagnosis not present

## 2016-12-10 DIAGNOSIS — R454 Irritability and anger: Secondary | ICD-10-CM

## 2016-12-10 DIAGNOSIS — F3171 Bipolar disorder, in partial remission, most recent episode hypomanic: Secondary | ICD-10-CM

## 2016-12-10 DIAGNOSIS — R4586 Emotional lability: Secondary | ICD-10-CM | POA: Diagnosis not present

## 2016-12-10 DIAGNOSIS — R4587 Impulsiveness: Secondary | ICD-10-CM

## 2016-12-10 DIAGNOSIS — G4700 Insomnia, unspecified: Secondary | ICD-10-CM

## 2016-12-10 DIAGNOSIS — F1910 Other psychoactive substance abuse, uncomplicated: Secondary | ICD-10-CM | POA: Diagnosis not present

## 2016-12-10 DIAGNOSIS — F419 Anxiety disorder, unspecified: Secondary | ICD-10-CM

## 2016-12-10 DIAGNOSIS — R450 Nervousness: Secondary | ICD-10-CM

## 2016-12-10 MED ORDER — TRINTELLIX 20 MG PO TABS: 20.0000 mg | ORAL_TABLET | Freq: Every day | ORAL | 1 refills | Status: DC

## 2016-12-10 MED ORDER — TOPIRAMATE 100 MG PO TABS: 100.0000 mg | ORAL_TABLET | Freq: Every day | ORAL | 1 refills | Status: DC

## 2016-12-10 MED ORDER — OLANZAPINE 5 MG PO TABS: 5.0000 mg | ORAL_TABLET | Freq: Two times a day (BID) | ORAL | 1 refills | Status: DC | PRN

## 2016-12-10 MED ORDER — LITHIUM CARBONATE ER 450 MG PO TBCR: 450.0000 mg | EXTENDED_RELEASE_TABLET | Freq: Every day | ORAL | 1 refills | Status: DC

## 2016-12-10 MED ORDER — DOXEPIN HCL 50 MG PO CAPS: 50.0000 mg | ORAL_CAPSULE | Freq: Every day | ORAL | 1 refills | Status: DC

## 2016-12-10 NOTE — Progress Notes (Signed)
Psychiatric Initial Adult Assessment   Patient Identification: Thomas Williamson MRN:  [id number] Date of Evaluation:  12/10/2016 Referral Source: self, rehab in [place] Complaint:  bipolar disorder, alcoholic Visit Diagnosis:    ICD-10-CM   1. Bipolar disorder, in partial remission, most recent episode hypomanic (HCC) F31.71 topiramate (TOPAMAX) 100 MG tablet    TRINTELLIX 20 MG TABS    lithium carbonate (ESKALITH) 450 MG CR tablet    doxepin (SINEQUAN) 50 MG capsule    OLANZapine (ZYPREXA) 5 MG tablet  2. Alcohol use disorder, severe, in early remission ([place]) F10.21     History of Present Illness:  Thomas Williamson presents for psychiatric hospitalization follow-up and intake assessment.  He had previously seen Dr. Mauri Pole in the community, and he was most recently in a rehab hospitalization in [facility].  The patient presents as fairly hyperactive and hyperverbal, and very sociable and energetic during our conversation.  He is able to be redirected and participate in the discussion of his mental health care history.  He reports that he has a history of bipolar disorder and his previously been on Lamictal, but when he was hospitalized in [facility] they switched him to lithium.  He was there for over a month and he felt like the lithium was helpful.  He reports that he also has a history of heavy alcohol use disorder, and reports that he has been generally sober over the past 4-6 weeks but had a slip up a few days ago and drank a few drinks.  He is fairly vague about his alcohol intake.  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  He tends to like socializing with others, and reports that the job has its stresses but has generally been a positive experience for the past year.  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  He reports that he generally struggles with anxiety and irritability, struggles with anger issues and at times can be physically destructive of property, most recently before the rehab stay, he punched a wall and had fractured his hand.  He reports that he has never been married and never had any success in romantic relationships other than casual dating because of his anger and drinking.  He denies any suicidality currently but has had suicidal thoughts in the past and psychiatric hospitalization in the past.  He has trouble sleeping at night, due to restlessness.  He tends to feel easily irritated with others, but also enjoys socialization.  He acknowledges that he struggles with impulse control issues, and the medications he was on at the hospital seem to be helping him.  We reviewed his medication list, as summarized below.  He was discharged on lithium, Zyprexa, Trintellix, Topamax, and doxepin as needed for sleep.  He asked writer if he can be on Xanax again because that has been helpful in the past.  I expressed to the patient that benzodiazepines increase the risk of relapse in alcoholics and the benzodiazepines are out of the question.  He was very receptive to this, and appreciated the honesty.  We agreed to restart his medications as prescribed on discharge given that he has been off of them for about 4 weeks.  He agrees to follow-up  this Probation officer in 10-12 weeks, and he continues to participate in individual therapy at [facility] with Janett Billow.  Associated Signs/Symptoms: Depression Symptoms:  insomnia, anxiety, (Hypo) Manic Symptoms:  Distractibility, Impulsivity, Irritable Mood, Labiality of Mood, Anxiety Symptoms:  none Psychotic Symptoms:  none PTSD Symptoms: Negative  Past Psychiatric History: Psychiatric hospitalization at [facility] approximately 18 months ago.  Prior medication management with  Dr. Letta Moynahan in [place].  Most recently in a rehab hospital, in [facility], for 1 month.  He was discharged 6 weeks ago.  He was given 2 weeks worth of medications and has run out of medications for the past 4 weeks.  Previous Psychotropic Medications: Yes   Substance Abuse History in the last 12 months:  Yes.    Consequences of Substance Abuse: Chronic alcohol use disorder, in early remission, had a couple episodic relapses over the past few weeks and drank 3 days ago.  Will  Past Medical History:  Past Medical History:  Diagnosis Date  . Allergy   . Anxiety   . GERD (gastroesophageal reflux disease)   . Hypertension   . IBS (irritable bowel syndrome)   . PONV (postoperative nausea and vomiting)     Past Surgical History:  Procedure Laterality Date  . ANTERIOR CRUCIATE LIGAMENT REPAIR  2010  . NOSE SURGERY  2000,2012    Family Psychiatric History: Family history of anxiety  Family History:  Family History  Problem Relation Age of Onset  . Hypertension Mother   . Hypertension Father     Social History:   Social History   Socioeconomic History  . Marital status: Single    Spouse name: None  . Number of children: None  . Years of education: None  . Highest education level: None  Social Needs  . Financial resource strain: Not very hard  . Food insecurity - worry: Never true  . Food insecurity - inability: Never true  . Transportation needs - medical: No  . Transportation needs - non-medical: No  Occupational History  . None  Tobacco Use  . Smoking status: Never Smoker  . Smokeless tobacco: Never Used  Substance and Sexual Activity  . Alcohol use: Yes    Comment: just got out of rehab  . Drug use: No  . Sexual activity: Yes  Other Topics Concern  . None  Social History Narrative  . None    Additional Social History: Has a bachelor's degree in business and marketing.  Never married, no children, dating women.  No history of IV drug use.   History of sexual promiscuity, hepatitis C positive  Allergies:   Allergies  Allergen Reactions  . Hydrocodone     UPSETS STOMACH    Metabolic Disorder Labs: No results found for: HGBA1C, MPG No results found for: PROLACTIN Lab Results  Component Value Date   CHOL 208 (H) 03/31/2016   TRIG 252.0 (H) 03/31/2016   HDL 45.10 03/31/2016   CHOLHDL 5 03/31/2016   VLDL 50.4 (H) 03/31/2016     Current Medications: Current Outpatient Medications  Medication Sig Dispense Refill  . losartan-hydrochlorothiazide (HYZAAR) 50-12.5 MG tablet TAKE 1 TABLET TWICE A DAY 180 tablet 1  . omeprazole (PRILOSEC) 20 MG capsule Take 20 mg by mouth daily.    . ondansetron (ZOFRAN) 4 MG tablet Take 1 tablet (4 mg total) by mouth every 8 (eight) hours as needed for nausea or vomiting. 20 tablet 0  . pravastatin (PRAVACHOL) 10 MG tablet TAKE 1 TABLET (10 MG TOTAL)  BY MOUTH DAILY. 90 tablet 2  . TRINTELLIX 20 MG TABS Take 20 mg daily by mouth. 90 tablet 1  . valACYclovir (VALTREX) 1000 MG tablet TAKE 1 TABLET (1,000 MG TOTAL) BY MOUTH 2 (TWO) TIMES DAILY. 20 tablet 0  . doxepin (SINEQUAN) 50 MG capsule Take 1 capsule (50 mg total) at bedtime by mouth. 90 capsule 1  . lithium carbonate (ESKALITH) 450 MG CR tablet Take 1 tablet (450 mg total) at bedtime by mouth. 90 tablet 1  . OLANZapine (ZYPREXA) 5 MG tablet Take 1 tablet (5 mg total) 2 (two) times daily as needed by mouth. 180 tablet 1  . topiramate (TOPAMAX) 100 MG tablet Take 1 tablet (100 mg total) at bedtime by mouth. Take 1/2 tablet for 1 week, then increase to 100 mg 90 tablet 1   No current facility-administered medications for this visit.     Neurologic: Headache: Negative Seizure: Negative Paresthesias:Negative  Musculoskeletal: Strength & Muscle Tone: within normal limits Gait & Station: normal Patient leans: N/A  Psychiatric Specialty Exam: Review of Systems  Constitutional: Negative.   HENT: Negative.   Eyes: Negative.    Respiratory: Negative.   Cardiovascular: Negative.   Gastrointestinal: Negative.        Hep c positive  Musculoskeletal: Negative.   Skin: Negative.   Neurological: Negative.   Psychiatric/Behavioral: Positive for depression and substance abuse. The patient is nervous/anxious and has insomnia.     Blood pressure 126/80, pulse (!) 102, height 5\' 9"  (1.753 m), weight 186 lb 3.2 oz (84.5 kg).Body mass index is 27.5 kg/m.  General Appearance: Casual and Fairly Groomed  Eye Contact:  Fair  Speech:  Clear and Coherent  Volume:  Normal  Mood:  Anxious and Irritable  Affect:  Congruent  Thought Process:  Goal Directed and Descriptions of Associations: Intact  Orientation:  Full (Time, Place, and Person)  Thought Content:  Logical  Suicidal Thoughts:  No  Homicidal Thoughts:  No  Memory:  Immediate;   Fair  Judgement:  Fair  Insight:  Fair  Psychomotor Activity:  Restlessness  Concentration:  Concentration: Fair  Recall:  [place] of Knowledge:Good  Language: Good  Akathisia:  Negative  Handed:  Right  AIMS (if indicated):  0  Assets:  Communication Skills Desire for Improvement Financial Resources/Insurance Housing Social Support Transportation Vocational/Educational  ADL's:  Intact  Cognition: WNL  Sleep:  5-7 hours    Treatment Plan Summary: Thomas Williamson is a 41 year old male with a psychiatric history consistent with bipolar disorder, complicated by severe alcohol use disorder in early remission.  He has had some episodes of drinking over the past few weeks, but not to his daily pattern as before.  He has a history of multiple psychiatric hospitalizations and suicidality, in addition to aggressive behaviors towards others and property.  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  He lives in [place] with his parents who are elderly and struggling with health issues, and reports that he has  enough days off that he is able to help his mom and dad out around the house and help take care of them.  On examination today, he presents as fairly elevated, not particularly angry or agitated, but restless and hyperverbal.  He has been off of his medications for about 4 weeks, we agreed to restart his psychiatric medications as below and he will follow-up next week for a laboratory visit.  1. Bipolar disorder, in partial remission, most recent episode hypomanic ([place])   2. Alcohol use disorder, severe, in early remission [facility])     Status of current problems: New patient and new problems to [employer] Ordered: No orders of the defined types were placed in this encounter.   Labs Reviewed: Reviewed labs from 1 month ago, positive for hepatitis C  Collateral Obtained/Records Reviewed: Reviewed discharge paperwork from rehab hospitalization in [name], reviewed medications on discharge and restarted as below  Plan:  Restart lithium 450 mg nightly; I re-educated patient on the need for lab draws and potential nephrogenic, thyroid toxicity Restart Zyprexa 5 mg twice daily  Restart Trintellix 20 mg daily Restart Topamax 100 mg nightly, suggested patient restart it at 50 mg for 1 week, then increase to 100 mg Continue in individual therapy at [facility] Return to clinic in 10-12 weeks for med management follow-up Return to clinic in 1 week for laboratory follow-up  I spent 40 minutes with the patient in direct face-to-face clinical care.  Greater than 50% of this time was spent in counseling and coordination of care with the patient.   Aundra Dubin, MD 11/8/201810:46 AM

## 2016-12-15 ENCOUNTER — Other Ambulatory Visit: Payer: Self-pay | Admitting: Family Medicine

## 2016-12-18 ENCOUNTER — Ambulatory Visit
Admission: RE | Admit: 2016-12-18 | Discharge: 2016-12-18 | Disposition: A | Discharge status: Home / Self Care | Payer: 59 | Source: Ambulatory Visit | Attending: Nurse Practitioner | Admitting: Nurse Practitioner

## 2016-12-18 ENCOUNTER — Ambulatory Visit (INDEPENDENT_AMBULATORY_CARE_PROVIDER_SITE_OTHER): Payer: 59

## 2016-12-18 DIAGNOSIS — B182 Chronic viral hepatitis C: Secondary | ICD-10-CM

## 2016-12-18 DIAGNOSIS — Z5181 Encounter for therapeutic drug level monitoring: Secondary | ICD-10-CM

## 2016-12-18 DIAGNOSIS — F3171 Bipolar disorder, in partial remission, most recent episode hypomanic: Secondary | ICD-10-CM

## 2016-12-18 DIAGNOSIS — Z79899 Other long term (current) drug therapy: Secondary | ICD-10-CM | POA: Diagnosis not present

## 2016-12-18 NOTE — Progress Notes (Signed)
Patient arrived for labs to check Lithium level. Patient tolerated the procedure well and without complaint. Specimen was collected and will go out to the lab. I told patient I will call him when the results come in.

## 2016-12-18 NOTE — Addendum Note (Signed)
Addended by: Lethea Killings on: 12/18/2016 09:22 AM   Modules accepted: Orders

## 2016-12-19 LAB — LITHIUM LEVEL: Lithium Lvl: 0.6 mmol/L (ref 0.6–1.2)

## 2017-02-11 ENCOUNTER — Other Ambulatory Visit: Payer: Self-pay | Admitting: Family Medicine

## 2017-02-23 ENCOUNTER — Encounter: Payer: Self-pay | Admitting: Family Medicine

## 2017-02-23 ENCOUNTER — Ambulatory Visit: Payer: 59 | Admitting: Family Medicine

## 2017-02-23 VITALS — BP 124/82 | HR 90 | Temp 98.2°F | Ht 69.0 in | Wt 181.8 lb

## 2017-02-23 DIAGNOSIS — J029 Acute pharyngitis, unspecified: Secondary | ICD-10-CM

## 2017-02-23 DIAGNOSIS — L738 Other specified follicular disorders: Secondary | ICD-10-CM | POA: Diagnosis not present

## 2017-02-23 LAB — POCT RAPID STREP A (OFFICE): Rapid Strep A Screen: NEGATIVE

## 2017-02-23 LAB — POC INFLUENZA A&B (BINAX/QUICKVUE)
Influenza A, POC: NEGATIVE
Influenza B, POC: NEGATIVE

## 2017-02-23 MED ORDER — CEPHALEXIN 500 MG PO CAPS: 500.0000 mg | ORAL_CAPSULE | Freq: Three times a day (TID) | ORAL | 0 refills | Status: DC

## 2017-02-23 NOTE — Patient Instructions (Addendum)
? Flu shot  Take the antibiotic as prescribed - this should treat the skin issues and strep throat if present.   Sore Throat When you have a sore throat, your throat may:  Hurt.  Burn.  Feel irritated.  Feel scratchy.  Many things can cause a sore throat, including:  An infection.  Allergies.  Dryness in the air.  Smoke or pollution.  Gastroesophageal reflux disease (GERD).  A tumor.  A sore throat can be the first sign of another sickness. It can happen with other problems, like coughing or a fever. Most sore throats go away without treatment. Follow these instructions at home:  Take over-the-counter medicines only as told by your doctor.  Drink enough fluids to keep your pee (urine) clear or pale yellow.  Rest when you feel you need to.  To help with pain, try: ? Sipping warm liquids, such as broth, herbal tea, or warm water. ? Eating or drinking cold or frozen liquids, such as frozen ice pops. ? Gargling with a salt-water mixture 3-4 times a day or as needed. To make a salt-water mixture, add -1 tsp of salt in 1 cup of warm water. Mix it until you cannot see the salt anymore. ? Sucking on hard candy or throat lozenges. ? Putting a cool-mist humidifier in your bedroom at night. ? Sitting in the bathroom with the door closed for 5-10 minutes while you run hot water in the shower.  Do not use any tobacco products, such as cigarettes, chewing tobacco, and e-cigarettes. If you need help quitting, ask your doctor. Contact a doctor if:  You have a fever for more than 2-3 days.  You keep having symptoms for more than 2-3 days.  Your throat does not get better in 7 days.  You have a fever and your symptoms suddenly get worse. Get help right away if:  You have trouble breathing.  You cannot swallow fluids, soft foods, or your saliva.  You have swelling in your throat or neck that gets worse.  You keep feeling like you are going to throw up (vomit).  You  keep throwing up. This information is not intended to replace advice given to you by your health care provider. Make sure you discuss any questions you have with your health care provider. Document Released: 10/29/2007 Document Revised: 09/15/2015 Document Reviewed: 11/09/2014 Elsevier Interactive Patient Education  2018 [employer]. Cephalexin tablets or capsules What is this medicine? CEPHALEXIN (sef a LEX in) is a cephalosporin antibiotic. It is used to treat certain kinds of bacterial infections It will not work for colds, flu, or other viral infections. This medicine may be used for other purposes; ask your health care provider or pharmacist if you have questions. COMMON BRAND NAME(S): Biocef, Daxbia, Keflex, Keftab What should I tell my health care provider before I take this medicine? They need to know if you have any of these conditions: -kidney disease -stomach or intestine problems, especially colitis -an unusual or allergic reaction to cephalexin, other cephalosporins, penicillins, other antibiotics, medicines, foods, dyes or preservatives -pregnant or trying to get pregnant -breast-feeding How should I use this medicine? Take this medicine by mouth with a full glass of water. Follow the directions on the prescription label. This medicine can be taken with or without food. Take your medicine at regular intervals. Do not take your medicine more often than directed. Take all of your medicine as directed even if you think you are better. Do not skip doses or stop your  medicine early. Talk to your pediatrician regarding the use of this medicine in children. While this drug may be prescribed for selected conditions, precautions do apply. Overdosage: If you think you have taken too much of this medicine contact a poison control center or emergency room at once. NOTE: This medicine is only for you. Do not share this medicine with others. What if I miss a dose? If you miss a dose, take it  as soon as you can. If it is almost time for your next dose, take only that dose. Do not take double or extra doses. There should be at least 4 to 6 hours between doses. What may interact with this medicine? -probenecid -some other antibiotics This list may not describe all possible interactions. Give your health care provider a list of all the medicines, herbs, non-prescription drugs, or dietary supplements you use. Also tell them if you smoke, drink alcohol, or use illegal drugs. Some items may interact with your medicine. What should I watch for while using this medicine? Tell your doctor or health care professional if your symptoms do not begin to improve in a few days. Do not treat diarrhea with over the counter products. Contact your doctor if you have diarrhea that lasts more than 2 days or if it is severe and watery. If you have diabetes, you may get a false-positive result for sugar in your urine. Check with your doctor or health care professional. What side effects may I notice from receiving this medicine? Side effects that you should report to your doctor or health care professional as soon as possible: -allergic reactions like skin rash, itching or hives, swelling of the face, lips, or tongue -breathing problems -pain or trouble passing urine -redness, blistering, peeling or loosening of the skin, including inside the mouth -severe or watery diarrhea -unusually weak or tired -yellowing of the eyes, skin Side effects that usually do not require medical attention (report to your doctor or health care professional if they continue or are bothersome): -gas or heartburn -genital or anal irritation -headache -joint or muscle pain -nausea, vomiting This list may not describe all possible side effects. Call your doctor for medical advice about side effects. You may report side effects to FDA at 1-800-FDA-1088. Where should I keep my medicine? Keep out of the reach of children. Store  at room temperature between 59 and 86 degrees F (15 and 30 degrees C). Throw away any unused medicine after the expiration date. NOTE: This sheet is a summary. It may not cover all possible information. If you have questions about this medicine, talk to your doctor, pharmacist, or health care provider.  2018 Elsevier/Gold Standard (2007-04-25 17:09:13)

## 2017-02-23 NOTE — Progress Notes (Signed)
HPI:  Acute visit for sore throat: -started:3-4 days ago -symptoms:nasal congestion, sore throat, cough, nausea, a little vomiting initially with diarrhea, low grade temp he thinks, body aches -denies:objective fever, SOB, wheezing -has tried: nothing -sick contacts/travel/risks: no reported flu, strep or tick exposure but around kids -also some folliculitis where shaves  ROS: See pertinent positives and negatives per HPI.  Past Medical History:  Diagnosis Date  . Allergy   . Anxiety   . GERD (gastroesophageal reflux disease)   . Hypertension   . IBS (irritable bowel syndrome)   . PONV (postoperative nausea and vomiting)     Past Surgical History:  Procedure Laterality Date  . ANTERIOR CRUCIATE LIGAMENT REPAIR  2010  . INGUINAL HERNIA REPAIR  02/23/2012   Procedure: LAPAROSCOPIC BILATERAL INGUINAL HERNIA REPAIR;  Surgeon: Shann Medal, MD;  Location: [facility];  Service: General;  Laterality: Bilateral;  Laparoscopic Bilateral Inguinal Hernia Repair with mesh  . INSERTION OF MESH  02/23/2012   Procedure: INSERTION OF MESH;  Surgeon: Shann Medal, MD;  Location: [facility];  Service: General;  Laterality: N/A;  . NOSE SURGERY  2000,2012    Family History  Problem Relation Age of Onset  . Hypertension Mother   . Hypertension Father     Social History   Socioeconomic History  . Marital status: Single    Spouse name: None  . Number of children: None  . Years of education: None  . Highest education level: None  Social Needs  . Financial resource strain: Not very hard  . Food insecurity - worry: Never true  . Food insecurity - inability: Never true  . Transportation needs - medical: No  . Transportation needs - non-medical: No  Occupational History  . None  Tobacco Use  . Smoking status: Never Smoker  . Smokeless tobacco: Never Used  Substance and Sexual Activity  . Alcohol use: Yes    Comment: just got out of rehab  . Drug use: No  . Sexual activity: Yes  Other  Topics Concern  . None  Social History Narrative  . None     Current Outpatient Medications:  .  doxepin (SINEQUAN) 50 MG capsule, Take 1 capsule (50 mg total) at bedtime by mouth., Disp: 90 capsule, Rfl: 1 .  lithium carbonate (ESKALITH) 450 MG CR tablet, Take 1 tablet (450 mg total) at bedtime by mouth., Disp: 90 tablet, Rfl: 1 .  losartan-hydrochlorothiazide (HYZAAR) 50-12.5 MG tablet, TAKE 1 TABLET TWICE A DAY, Disp: 180 tablet, Rfl: 0 .  OLANZapine (ZYPREXA) 5 MG tablet, Take 1 tablet (5 mg total) 2 (two) times daily as needed by mouth., Disp: 180 tablet, Rfl: 1 .  omeprazole (PRILOSEC) 20 MG capsule, Take 20 mg by mouth daily., Disp: , Rfl:  .  ondansetron (ZOFRAN) 4 MG tablet, Take 1 tablet (4 mg total) by mouth every 8 (eight) hours as needed for nausea or vomiting., Disp: 20 tablet, Rfl: 0 .  pravastatin (PRAVACHOL) 10 MG tablet, TAKE 1 TABLET (10 MG TOTAL) BY MOUTH DAILY., Disp: 90 tablet, Rfl: 1 .  topiramate (TOPAMAX) 100 MG tablet, Take 1 tablet (100 mg total) at bedtime by mouth. Take 1/2 tablet for 1 week, then increase to 100 mg, Disp: 90 tablet, Rfl: 1 .  TRINTELLIX 20 MG TABS, Take 20 mg daily by mouth., Disp: 90 tablet, Rfl: 1 .  valACYclovir (VALTREX) 1000 MG tablet, TAKE 1 TABLET (1,000 MG TOTAL) BY MOUTH 2 (TWO) TIMES DAILY., Disp: 20 tablet, Rfl:  0 .  cephALEXin (KEFLEX) 500 MG capsule, Take 1 capsule (500 mg total) by mouth 3 (three) times daily., Disp: 30 capsule, Rfl: 0  EXAM:  Vitals:   02/23/17 1359  BP: 124/82  Pulse: 90  Temp: 98.2 F (36.8 C)    Body mass index is 26.85 kg/m.  GENERAL: vitals reviewed and listed above, alert, oriented, appears well hydrated and in no acute distress  HEENT: atraumatic, conjunttiva clear, no obvious abnormalities on inspection of external nose and ears, normal appearance of ear canals and TMs, clear nasal congestion, mild post oropharyngeal erythema with PND, some tonsillar erythema without exudate or significant edema,  no sinus TTP  NECK: no obvious masses on inspection, anterior cervical lymphadenopathy an area of folliculitis  LUNGS: clear to auscultation bilaterally, no wheezes, rales or rhonchi, good air movement,   CV: HRRR, no peripheral edema  SKIN: Folliculitis in shaving area right beard with several small pustules  MS: moves all extremities without noticeable abnormality  PSYCH: pleasant and cooperative, no obvious depression or anxiety  ASSESSMENT AND PLAN:  Discussed the following assessment and plan:  Sore throat  Folliculitis barbae  -He wanted to do a flu test, discussed treatment if this were flu and opted not to do Tamiflu given duration of symptoms and no fever today and benefit likely not high given timing -Strep rapid testing neg -We will treat with Keflex for the folliculitis which also will cover strep pharyngitis of present -of course, we advised to return or notify a doctor immediately if symptoms worsen or persist or new concerns arise.  He is also to follow-up if the skin rash worsens or does not resolve or if the lymphadenopathy remains after treatment.    Patient Instructions  Take the antibiotic as prescribed - this should treat the skin issues and strep throat if present.   Sore Throat When you have a sore throat, your throat may:  Hurt.  Burn.  Feel irritated.  Feel scratchy.  Many things can cause a sore throat, including:  An infection.  Allergies.  Dryness in the air.  Smoke or pollution.  Gastroesophageal reflux disease (GERD).  A tumor.  A sore throat can be the first sign of another sickness. It can happen with other problems, like coughing or a fever. Most sore throats go away without treatment. Follow these instructions at home:  Take over-the-counter medicines only as told by your doctor.  Drink enough fluids to keep your pee (urine) clear or pale yellow.  Rest when you feel you need to.  To help with pain, try: ? Sipping  warm liquids, such as broth, herbal tea, or warm water. ? Eating or drinking cold or frozen liquids, such as frozen ice pops. ? Gargling with a salt-water mixture 3-4 times a day or as needed. To make a salt-water mixture, add -1 tsp of salt in 1 cup of warm water. Mix it until you cannot see the salt anymore. ? Sucking on hard candy or throat lozenges. ? Putting a cool-mist humidifier in your bedroom at night. ? Sitting in the bathroom with the door closed for 5-10 minutes while you run hot water in the shower.  Do not use any tobacco products, such as cigarettes, chewing tobacco, and e-cigarettes. If you need help quitting, ask your doctor. Contact a doctor if:  You have a fever for more than 2-3 days.  You keep having symptoms for more than 2-3 days.  Your throat does not get better in 7 days.  You have a fever and your symptoms suddenly get worse. Get help right away if:  You have trouble breathing.  You cannot swallow fluids, soft foods, or your saliva.  You have swelling in your throat or neck that gets worse.  You keep feeling like you are going to throw up (vomit).  You keep throwing up. This information is not intended to replace advice given to you by your health care provider. Make sure you discuss any questions you have with your health care provider. Document Released: 10/29/2007 Document Revised: 09/15/2015 Document Reviewed: 11/09/2014 Elsevier Interactive Patient Education  2018 [employer]. Cephalexin tablets or capsules What is this medicine? CEPHALEXIN (sef a LEX in) is a cephalosporin antibiotic. It is used to treat certain kinds of bacterial infections It will not work for colds, flu, or other viral infections. This medicine may be used for other purposes; ask your health care provider or pharmacist if you have questions. COMMON BRAND NAME(S): Biocef, Daxbia, Keflex, Keftab What should I tell my health care provider before I take this medicine? They need  to know if you have any of these conditions: -kidney disease -stomach or intestine problems, especially colitis -an unusual or allergic reaction to cephalexin, other cephalosporins, penicillins, other antibiotics, medicines, foods, dyes or preservatives -pregnant or trying to get pregnant -breast-feeding How should I use this medicine? Take this medicine by mouth with a full glass of water. Follow the directions on the prescription label. This medicine can be taken with or without food. Take your medicine at regular intervals. Do not take your medicine more often than directed. Take all of your medicine as directed even if you think you are better. Do not skip doses or stop your medicine early. Talk to your pediatrician regarding the use of this medicine in children. While this drug may be prescribed for selected conditions, precautions do apply. Overdosage: If you think you have taken too much of this medicine contact a poison control center or emergency room at once. NOTE: This medicine is only for you. Do not share this medicine with others. What if I miss a dose? If you miss a dose, take it as soon as you can. If it is almost time for your next dose, take only that dose. Do not take double or extra doses. There should be at least 4 to 6 hours between doses. What may interact with this medicine? -probenecid -some other antibiotics This list may not describe all possible interactions. Give your health care provider a list of all the medicines, herbs, non-prescription drugs, or dietary supplements you use. Also tell them if you smoke, drink alcohol, or use illegal drugs. Some items may interact with your medicine. What should I watch for while using this medicine? Tell your doctor or health care professional if your symptoms do not begin to improve in a few days. Do not treat diarrhea with over the counter products. Contact your doctor if you have diarrhea that lasts more than 2 days or if it is  severe and watery. If you have diabetes, you may get a false-positive result for sugar in your urine. Check with your doctor or health care professional. What side effects may I notice from receiving this medicine? Side effects that you should report to your doctor or health care professional as soon as possible: -allergic reactions like skin rash, itching or hives, swelling of the face, lips, or tongue -breathing problems -pain or trouble passing urine -redness, blistering, peeling or loosening of the skin,  including inside the mouth -severe or watery diarrhea -unusually weak or tired -yellowing of the eyes, skin Side effects that usually do not require medical attention (report to your doctor or health care professional if they continue or are bothersome): -gas or heartburn -genital or anal irritation -headache -joint or muscle pain -nausea, vomiting This list may not describe all possible side effects. Call your doctor for medical advice about side effects. You may report side effects to FDA at 1-800-FDA-1088. Where should I keep my medicine? Keep out of the reach of children. Store at room temperature between 59 and 86 degrees F (15 and 30 degrees C). Throw away any unused medicine after the expiration date. NOTE: This sheet is a summary. It may not cover all possible information. If you have questions about this medicine, talk to your doctor, pharmacist, or health care provider.  2018 Elsevier/Gold Standard (2007-04-25 17:09:13)    Lucretia Kern, DO

## 2017-02-25 LAB — CULTURE, GROUP A STREP
MICRO NUMBER:: 90090468
SPECIMEN QUALITY: ADEQUATE

## 2017-03-04 ENCOUNTER — Ambulatory Visit (HOSPITAL_COMMUNITY): Payer: Self-pay | Admitting: Psychiatry

## 2017-03-15 ENCOUNTER — Telehealth: Payer: Self-pay | Admitting: *Deleted

## 2017-03-15 NOTE — Telephone Encounter (Signed)
[facility] faxed a request for a prior auth on Losartan-hydrochlorothiazide 50-12.5 as the max is 1/per day.  Request sent to [id number].

## 2017-03-16 NOTE — Telephone Encounter (Signed)
Please let him know. We can send losartan -hctz 100-25 to be taken once daily in the morning. That is typically how this medication is taken and = the dose he is taking now.ok to chang dose and send ne rx. #90, 3 rf if he is agreeable.

## 2017-03-16 NOTE — Telephone Encounter (Signed)
Per note in [email or web address] state the request was denied and a fax will be sent to the office.  Message sent to Dr Maudie Mercury.

## 2017-03-18 ENCOUNTER — Ambulatory Visit (HOSPITAL_COMMUNITY): Payer: 59 | Admitting: Psychiatry

## 2017-03-18 NOTE — Telephone Encounter (Signed)
I called the pt and informed him of the message below.  Patient stated to hold on the medication as he is in the process of changing insurances and will give his pharmacy the new info and call back if needed.

## 2017-03-23 ENCOUNTER — Other Ambulatory Visit (HOSPITAL_COMMUNITY): Payer: Self-pay | Admitting: Psychiatry

## 2017-03-23 ENCOUNTER — Other Ambulatory Visit (HOSPITAL_COMMUNITY): Payer: Self-pay

## 2017-03-23 DIAGNOSIS — F3171 Bipolar disorder, in partial remission, most recent episode hypomanic: Secondary | ICD-10-CM

## 2017-03-23 MED ORDER — OLANZAPINE 10 MG PO TABS: 10.0000 mg | ORAL_TABLET | Freq: Every day | ORAL | 0 refills | Status: DC

## 2017-03-23 MED ORDER — FLUOXETINE HCL 20 MG PO CAPS: 20.0000 mg | ORAL_CAPSULE | Freq: Every day | ORAL | 2 refills | Status: DC

## 2017-03-23 NOTE — Progress Notes (Signed)
Sent script to pharmacy per Silvio Pate

## 2017-03-28 ENCOUNTER — Other Ambulatory Visit: Payer: Self-pay | Admitting: Family Medicine

## 2017-04-01 DIAGNOSIS — B182 Chronic viral hepatitis C: Secondary | ICD-10-CM | POA: Diagnosis not present

## 2017-04-06 ENCOUNTER — Encounter (HOSPITAL_COMMUNITY): Payer: Self-pay | Admitting: Psychiatry

## 2017-04-06 ENCOUNTER — Ambulatory Visit (HOSPITAL_COMMUNITY): Payer: BLUE CROSS/BLUE SHIELD | Admitting: Psychiatry

## 2017-04-06 DIAGNOSIS — F1020 Alcohol dependence, uncomplicated: Secondary | ICD-10-CM | POA: Diagnosis not present

## 2017-04-06 DIAGNOSIS — F3171 Bipolar disorder, in partial remission, most recent episode hypomanic: Secondary | ICD-10-CM

## 2017-04-06 DIAGNOSIS — B001 Herpesviral vesicular dermatitis: Secondary | ICD-10-CM | POA: Diagnosis not present

## 2017-04-06 MED ORDER — OLANZAPINE 10 MG PO TABS: 10.0000 mg | ORAL_TABLET | Freq: Every day | ORAL | 0 refills | Status: DC

## 2017-04-06 MED ORDER — TOPIRAMATE 100 MG PO TABS: 100.0000 mg | ORAL_TABLET | Freq: Every day | ORAL | 1 refills | Status: DC

## 2017-04-06 MED ORDER — LITHIUM CARBONATE ER 450 MG PO TBCR: 450.0000 mg | EXTENDED_RELEASE_TABLET | Freq: Every day | ORAL | 0 refills | Status: DC

## 2017-04-06 MED ORDER — VORTIOXETINE HBR 20 MG PO TABS: 20.0000 mg | ORAL_TABLET | Freq: Every day | ORAL | 0 refills | Status: DC

## 2017-04-06 MED ORDER — GABAPENTIN 300 MG PO CAPS: 300.0000 mg | ORAL_CAPSULE | Freq: Three times a day (TID) | ORAL | 0 refills | Status: DC

## 2017-04-06 MED ORDER — DOXEPIN HCL 50 MG PO CAPS: 50.0000 mg | ORAL_CAPSULE | Freq: Every day | ORAL | 1 refills | Status: DC

## 2017-04-06 NOTE — Progress Notes (Signed)
[facility] MD/PA/NP OP Progress Note  04/06/2017 4:03 PM Thomas Williamson  MRN:  [id number]  Chief Complaint: Med management, stressful few months HPI: Larin Weissberg reports that he is recovering from a stressful few months.  He reports that he was charged with contempt of court any complicated series of events that involved past mistakes he had made in 2017.  He spent 30 days in jail, and resigned from his job at the [employer] as well as a result of these events.  I spent time with him reviewing the events and processing some of his distress.  He has been out of jail for the past 4-5 weeks, reports that he has been drinking about 3-4 days/week.  He reports that he has not drank in over 48 hours.  He has some mild appetite decrease and mild tremulousness, but no delirium or hallucinosis.  No history of alcohol withdrawal seizures.  He was agreeable to initiate gabapentin to help with abstinence and reduce risk of seizure, and help with anxiety.  He is committed to restarting his abstinence, and is considering options moving forward including radiology careers, becoming an Engineering geologist.  He realizes that drinking is absolutely not in line with his goals as an individual for his mental health or for his career.   He denies any acute safety issues.  He continues on lithium, Trintellix, olanzapine, doxepin, and Topamax as prescribed.  He agrees to follow-up in 8 weeks and will reach out if any concerns come up in the meantime.  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  Visit Diagnosis:    ICD-10-CM   1. Alcohol use disorder, moderate, dependence (HCC) F10.20   2. 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 Bipolar disorder, in partial remission, most recent episode hypomanic (HCC) F31.71 topiramate (TOPAMAX) 100 MG tablet    OLANZapine (ZYPREXA) 10 MG tablet    lithium carbonate (ESKALITH) 450 MG CR tablet    doxepin (SINEQUAN) 50 MG capsule     Past Psychiatric History: See intake H&P for full details. Reviewed, with no updates at this time.   Past Medical History:  Past Medical History:  Diagnosis Date  . Allergy   . Anxiety   . GERD (gastroesophageal reflux disease)   . Hypertension   . IBS (irritable bowel syndrome)   . PONV (postoperative nausea and vomiting)     Past Surgical History:  Procedure Laterality Date  . ANTERIOR CRUCIATE LIGAMENT REPAIR  2010  . INGUINAL HERNIA REPAIR  02/23/2012   Procedure: LAPAROSCOPIC BILATERAL INGUINAL HERNIA REPAIR;  Surgeon: Shann Medal, MD;  Location: [facility];  Service: General;  Laterality: Bilateral;  Laparoscopic Bilateral Inguinal Hernia Repair with mesh  . INSERTION OF MESH  02/23/2012   Procedure: INSERTION OF MESH;  Surgeon: Shann Medal, MD;  Location: [facility];  Service: General;  Laterality: N/A;  . NOSE SURGERY  [id number]    Family Psychiatric History: See intake H&P for full details. Reviewed, with no updates at this time.   Family History:  Family History  Problem Relation Age of Onset  . Hypertension Mother   . Hypertension Father     Social History:  Social History   Socioeconomic History  . Marital status: Single    Spouse name: None  . Number of children: None  . Years of education: None  . Highest education level: None  Social Needs  . Financial resource strain: Not very hard  .  Food insecurity - worry: Never true  . Food insecurity - inability: Never true  . Transportation needs - medical: No  . Transportation needs - non-medical: No  Occupational History  . None  Tobacco Use  . Smoking status: Never Smoker  . Smokeless tobacco: Never Used  Substance and Sexual Activity  . Alcohol use: Yes    Comment: just got out of rehab  . Drug use: No  . Sexual activity: Yes  Other Topics Concern  . None  Social History Narrative  . None    Allergies:  Allergies  Allergen Reactions  . Hydrocodone     UPSETS STOMACH    Metabolic  Disorder Labs: No results found for: HGBA1C, MPG No results found for: PROLACTIN Lab Results  Component Value Date   CHOL 208 (H) 03/31/2016   TRIG 252.0 (H) 03/31/2016   HDL 45.10 03/31/2016   CHOLHDL 5 03/31/2016   VLDL 50.4 (H) 03/31/2016   Lab Results  Component Value Date   TSH 0.74 03/31/2016   TSH 0.48 09/20/2013    Therapeutic Level Labs: Lab Results  Component Value Date   LITHIUM 0.6 12/18/2016   No results found for: VALPROATE No components found for:  CBMZ  Current Medications: Current Outpatient Medications  Medication Sig Dispense Refill  . cephALEXin (KEFLEX) 500 MG capsule Take 1 capsule (500 mg total) by mouth 3 (three) times daily. 30 capsule 0  . doxepin (SINEQUAN) 50 MG capsule Take 1 capsule (50 mg total) by mouth at bedtime. 90 capsule 1  . gabapentin (NEURONTIN) 300 MG capsule Take 1 capsule (300 mg total) by mouth 3 (three) times daily. 90 capsule 0  . lithium carbonate (ESKALITH) 450 MG CR tablet Take 1 tablet (450 mg total) by mouth at bedtime. 90 tablet 0  . losartan-hydrochlorothiazide (HYZAAR) 50-12.5 MG tablet TAKE 1 TABLET TWICE A DAY 180 tablet 0  . OLANZapine (ZYPREXA) 10 MG tablet Take 1 tablet (10 mg total) by mouth at bedtime. 90 tablet 0  . omeprazole (PRILOSEC) 20 MG capsule Take 20 mg by mouth daily.    . ondansetron (ZOFRAN) 4 MG tablet Take 1 tablet (4 mg total) by mouth every 8 (eight) hours as needed for nausea or vomiting. 20 tablet 0  . pravastatin (PRAVACHOL) 10 MG tablet TAKE 1 TABLET (10 MG TOTAL) BY MOUTH DAILY. 90 tablet 1  . topiramate (TOPAMAX) 100 MG tablet Take 1 tablet (100 mg total) by mouth at bedtime. 90 tablet 1  . valACYclovir (VALTREX) 1000 MG tablet TAKE 1 TABLET (1,000 MG TOTAL) BY MOUTH 2 (TWO) TIMES DAILY. 20 tablet 0  . vortioxetine HBr (TRINTELLIX) 20 MG TABS tablet Take 1 tablet (20 mg total) by mouth daily. 90 tablet 0   No current facility-administered medications for this visit.       Musculoskeletal: Strength & Muscle Tone: within normal limits Gait & Station: normal Patient leans: N/A  Psychiatric Specialty Exam: ROS  There were no vitals taken for this visit.There is no height or weight on file to calculate BMI.  General Appearance: Casual and Well Groomed  Eye Contact:  Fair  Speech:  Clear and Coherent and Normal Rate  Volume:  Normal  Mood:  Anxious and Euthymic  Affect:  Appropriate and Congruent  Thought Process:  Goal Directed and Descriptions of Associations: Intact  Orientation:  Full (Time, Place, and Person)  Thought Content: Logical   Suicidal Thoughts:  No  Homicidal Thoughts:  No  Memory:  Immediate;   Good  Judgement:  Fair  Insight:  Fair  Psychomotor Activity:  Normal  Concentration:  Concentration: Good  Recall:  Good  Fund of Knowledge: Good  Language: Good  Akathisia:  Negative  Handed:  Right  AIMS (if indicated): not done  Assets:  Communication Skills Desire for Improvement Financial Resources/Insurance Housing Social Support Transportation  ADL's:  Intact  Cognition: WNL  Sleep:  Good   Screenings:   Assessment and Plan:  Euel Castile presents for medication management follow-up.  His mood has been relatively stable considering some of the recent significant work stressors and legal stressors.  He has resigned from his job at the [employer], and is pursuing a career in Animal nutritionist.  He has support from his parents with whom he lives.  He has relapsed onto alcohol use over the past few weeks, and is agreeable to initiating gabapentin to help with anxiety and craving reduction.  He has taken this in the past with some benefit.  He does not have any acute safety issues, at this time, and we will follow-up in 6-8 weeks or sooner if needed.    May ultimately consider initiating a acamprosate or naltrexone if he does not have some reconstitution of sobriety with gabapentin.  He is currently 48 hours  sober, and understands the risks of withdrawal tend to occur in the first 72 hours.  1. Alcohol use disorder, moderate, dependence ([place])   2. Recurrent cold sores   3. Bipolar disorder, in partial remission, most recent episode hypomanic ([place])     Status of current problems: gradually worsening  Labs Ordered: No orders of the defined types were placed in this encounter.   Labs Reviewed: n/a  Collateral Obtained/Records Reviewed: n/a  Plan:  Continue Trintellix 20 mg daily, Topamax 100 mg nightly, Zyprexa 10 mg daily, lithium 450 mg nightly, and doxepin 50 mg nightly Initiate gabapentin 300 mg 3 times daily Follow-up in 8 weeks continue [facility] Consider CD-IOP   I spent 30 minutes with the patient in direct face-to-face clinical care.  Greater than 50% of this time was spent in counseling and coordination of care with the patient.    Aundra Dubin, MD 04/06/2017, 4:03 PM

## 2017-04-22 ENCOUNTER — Telehealth: Payer: Self-pay | Admitting: *Deleted

## 2017-04-22 NOTE — Telephone Encounter (Signed)
Prior auth for Losartan-HCTZ 50-12.5mg  tab #180 sent to [id number].

## 2017-05-07 DIAGNOSIS — B182 Chronic viral hepatitis C: Secondary | ICD-10-CM | POA: Diagnosis not present

## 2017-05-12 ENCOUNTER — Telehealth (HOSPITAL_COMMUNITY): Payer: Self-pay

## 2017-05-12 NOTE — Telephone Encounter (Signed)
Patient called and said that the insurance will not pay for Trintellix, so he wants to know if there is any other medication that he should try. Please advise

## 2017-05-12 NOTE — Telephone Encounter (Signed)
The best equivalent would be fluoxetine - can start at 20 mg daily and follow up as scheduled. If he is interested, okay to send in 90 days. Thank you!

## 2017-05-13 ENCOUNTER — Other Ambulatory Visit (HOSPITAL_COMMUNITY): Payer: Self-pay

## 2017-05-13 MED ORDER — FLUOXETINE HCL 20 MG PO CAPS: 20.0000 mg | ORAL_CAPSULE | Freq: Every day | ORAL | 0 refills | Status: DC

## 2017-05-13 NOTE — Telephone Encounter (Signed)
[facility], notified patient

## 2017-05-17 ENCOUNTER — Telehealth (HOSPITAL_COMMUNITY): Payer: Self-pay

## 2017-05-17 NOTE — Telephone Encounter (Signed)
Its meant to just be 10 mg nightly - okay to send a change

## 2017-05-17 NOTE — Telephone Encounter (Signed)
Received a fax  from pharmacy stating that the insurance will only pay for 1 tab per day of the Olanzapine 5mg ,  prescription was written for 2 tabs per day. If script can't be changed I can call the insurance company and complete a Prior Authorization for the patient.

## 2017-05-18 NOTE — Telephone Encounter (Signed)
Faxed paper back to pharmacy with instructions from Dr. Daron Offer on 05-17-17 . Called pharmacy on 05-18-17 to reiterate instructions again from Dr. Daron Offer.

## 2017-06-01 ENCOUNTER — Ambulatory Visit (HOSPITAL_COMMUNITY): Payer: BLUE CROSS/BLUE SHIELD | Admitting: Psychiatry

## 2017-06-04 ENCOUNTER — Ambulatory Visit (HOSPITAL_COMMUNITY): Payer: BLUE CROSS/BLUE SHIELD | Admitting: Psychiatry

## 2017-06-25 ENCOUNTER — Ambulatory Visit (HOSPITAL_COMMUNITY): Payer: BLUE CROSS/BLUE SHIELD | Admitting: Psychiatry

## 2017-07-03 DIAGNOSIS — B182 Chronic viral hepatitis C: Secondary | ICD-10-CM | POA: Diagnosis not present

## 2017-07-08 DIAGNOSIS — B182 Chronic viral hepatitis C: Secondary | ICD-10-CM | POA: Diagnosis not present

## 2017-07-20 ENCOUNTER — Encounter (HOSPITAL_COMMUNITY): Payer: Self-pay | Admitting: Psychiatry

## 2017-07-20 ENCOUNTER — Ambulatory Visit (HOSPITAL_COMMUNITY): Payer: BLUE CROSS/BLUE SHIELD | Admitting: Psychiatry

## 2017-07-20 VITALS — BP 118/76 | HR 115 | Ht 70.0 in | Wt 192.6 lb

## 2017-07-20 DIAGNOSIS — F1020 Alcohol dependence, uncomplicated: Secondary | ICD-10-CM

## 2017-07-20 DIAGNOSIS — F3171 Bipolar disorder, in partial remission, most recent episode hypomanic: Secondary | ICD-10-CM | POA: Diagnosis not present

## 2017-07-20 MED ORDER — TOPIRAMATE 100 MG PO TABS: 100.0000 mg | ORAL_TABLET | Freq: Every day | ORAL | 0 refills | Status: DC

## 2017-07-20 MED ORDER — FLUOXETINE HCL 40 MG PO CAPS: 40.0000 mg | ORAL_CAPSULE | Freq: Every day | ORAL | 0 refills | Status: DC

## 2017-07-20 MED ORDER — LITHIUM CARBONATE ER 450 MG PO TBCR: 450.0000 mg | EXTENDED_RELEASE_TABLET | Freq: Every day | ORAL | 0 refills | Status: DC

## 2017-07-20 MED ORDER — OLANZAPINE 5 MG PO TABS: 5.0000 mg | ORAL_TABLET | Freq: Every day | ORAL | 0 refills | Status: DC

## 2017-07-20 NOTE — Progress Notes (Signed)
[place] MD/PA/NP OP Progress Note  07/20/2017 4:29 PM Thomas Williamson  MRN:  [id number]  Chief Complaint: Doing pretty well HPI: Thomas Williamson presents for med management follow-up and reports overall his mood is been stable.  He drinks alcohol about 1-2 times per week, 3-4 alcoholic beverages in a sitting.  He reports that he recently completed his treatment for hepatitis C and is negative for hepatitis C now.  I spent time with him reviewing what his thoughts on his alcohol use are and if he thinks there are ways he can keep an eye on his use.  We agreed to continue the Zyprexa 5 mg nightly, lithium, Prozac, Topamax.  We agreed to increase Prozac to 40 mg for a robust maintenance dose of SSRI.  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  His legal issues of yet to resolve.  We will follow-up in 8 weeks or sooner if needed.  Visit Diagnosis:    ICD-10-CM   1. Alcohol use disorder, moderate, dependence (HCC) F10.20   2. Bipolar disorder, in partial remission, most recent episode hypomanic (HCC) F31.71 OLANZapine (ZYPREXA) 5 MG tablet    lithium carbonate (ESKALITH) 450 MG CR tablet    FLUoxetine (PROZAC) 40 MG capsule    topiramate (TOPAMAX) 100 MG tablet    Past Psychiatric History: See intake H&P for full details. Reviewed, with no updates at this time.   Past Medical History:  Past Medical History:  Diagnosis Date  . Allergy   . Anxiety   . GERD (gastroesophageal reflux disease)   . Hypertension   . IBS (irritable bowel syndrome)   . PONV (postoperative nausea and vomiting)     Past Surgical History:  Procedure Laterality Date  . ANTERIOR CRUCIATE LIGAMENT REPAIR  2010  . INGUINAL HERNIA REPAIR  02/23/2012   Procedure: LAPAROSCOPIC BILATERAL INGUINAL HERNIA REPAIR;  Surgeon: Shann Medal, MD;  Location: [facility];  Service: General;  Laterality: Bilateral;  Laparoscopic Bilateral Inguinal Hernia Repair with mesh  . INSERTION OF MESH   02/23/2012   Procedure: INSERTION OF MESH;  Surgeon: Shann Medal, MD;  Location: [facility];  Service: General;  Laterality: N/A;  . NOSE SURGERY  [id number]    Family Psychiatric History: See intake H&P for full details. Reviewed, with no updates at this time.   Family History:  Family History  Problem Relation Age of Onset  . Hypertension Mother   . Hypertension Father     Social History:  Social History   Socioeconomic History  . Marital status: Single    Spouse name: Not on file  . Number of children: Not on file  . Years of education: Not on file  . Highest education level: Not on file  Occupational History  . Not on file  Social Needs  . Financial resource strain: Not very hard  . Food insecurity:    Worry: Never true    Inability: Never true  . Transportation needs:    Medical: No    Non-medical: No  Tobacco Use  . Smoking status: Never Smoker  . Smokeless tobacco: Never Used  Substance and Sexual Activity  . Alcohol use: Yes    Alcohol/week: 4.8 oz    Types: 8 Shots of liquor per week  . Drug use: No  . Sexual activity: Not Currently  Lifestyle  . Physical activity:    Days per week: 0 days    Minutes  per session: 0 min  . Stress: Very much  Relationships  . Social connections:    Talks on phone: More than three times a week    Gets together: Twice a week    Attends religious service: Never    Active member of club or organization: No    Attends meetings of clubs or organizations: Never    Relationship status: Never married  Other Topics Concern  . Not on file  Social History Narrative  . Not on file    Allergies:  Allergies  Allergen Reactions  . Hydrocodone     UPSETS STOMACH    Metabolic Disorder Labs: No results found for: HGBA1C, MPG No results found for: PROLACTIN Lab Results  Component Value Date   CHOL 208 (H) 03/31/2016   TRIG 252.0 (H) 03/31/2016   HDL 45.10 03/31/2016   CHOLHDL 5 03/31/2016   VLDL 50.4 (H) 03/31/2016    Lab Results  Component Value Date   TSH 0.74 03/31/2016   TSH 0.48 09/20/2013    Therapeutic Level Labs: Lab Results  Component Value Date   LITHIUM 0.6 12/18/2016   No results found for: VALPROATE No components found for:  CBMZ  Current Medications: Current Outpatient Medications  Medication Sig Dispense Refill  . doxepin (SINEQUAN) 50 MG capsule Take 1 capsule (50 mg total) by mouth at bedtime. 90 capsule 1  . FLUoxetine (PROZAC) 40 MG capsule Take 1 capsule (40 mg total) by mouth daily. 90 capsule 0  . lithium carbonate (ESKALITH) 450 MG CR tablet Take 1 tablet (450 mg total) by mouth at bedtime. 90 tablet 0  . losartan-hydrochlorothiazide (HYZAAR) 50-12.5 MG tablet TAKE 1 TABLET TWICE A DAY 180 tablet 0  . OLANZapine (ZYPREXA) 5 MG tablet Take 1 tablet (5 mg total) by mouth at bedtime. 90 tablet 0  . omeprazole (PRILOSEC) 20 MG capsule Take 20 mg by mouth daily.    . pravastatin (PRAVACHOL) 10 MG tablet TAKE 1 TABLET (10 MG TOTAL) BY MOUTH DAILY. 90 tablet 1  . topiramate (TOPAMAX) 100 MG tablet Take 1 tablet (100 mg total) by mouth at bedtime. 90 tablet 0  . valACYclovir (VALTREX) 1000 MG tablet TAKE 1 TABLET (1,000 MG TOTAL) BY MOUTH 2 (TWO) TIMES DAILY. 20 tablet 0  . ondansetron (ZOFRAN) 4 MG tablet Take 1 tablet (4 mg total) by mouth every 8 (eight) hours as needed for nausea or vomiting. (Patient not taking: Reported on 07/20/2017) 20 tablet 0   No current facility-administered medications for this visit.      Musculoskeletal: Strength & Muscle Tone: within normal limits Gait & Station: normal Patient leans: N/A  Psychiatric Specialty Exam: ROS  Blood pressure 118/76, pulse (!) 115, height 5\' 10"  (1.778 m), weight 192 lb 9.6 oz (87.4 kg), SpO2 97 %.Body mass index is 27.64 kg/m.  General Appearance: Casual and Well Groomed  Eye Contact:  Good  Speech:  Clear and Coherent and Normal Rate  Volume:  Normal  Mood:  Euthymic  Affect:  Appropriate and Congruent   Thought Process:  Goal Directed and Descriptions of Associations: Intact  Orientation:  Full (Time, Place, and Person)  Thought Content: Logical   Suicidal Thoughts:  No  Homicidal Thoughts:  No  Memory:  Immediate;   Good  Judgement:  Fair  Insight:  Fair  Psychomotor Activity:  Normal  Concentration:  Concentration: Good  Recall:  Good  Fund of Knowledge: Good  Language: Good  Akathisia:  Negative  Handed:  Right  AIMS (if indicated): not done  Assets:  Communication Skills Desire for Improvement Financial Resources/Insurance Housing Leisure Time [place] Talents/Skills Transportation Vocational/Educational  ADL's:  Intact  Cognition: WNL  Sleep:  Good   Screenings:   Assessment and Plan:  Thomas Williamson presents with relatively stable mood.  He is continuing to drink alcohol periodically, and does not appear motivated right now to work on abstinence.  He identifies his pattern of use is approximately twice per week, but is drinking 3-4 beverages in a sitting which is beyond the recommended amount.  I suggested we increase Prozac to 40 mg and I am hopeful this may help to reduce some of his anxiety and dysphoria which seems to generally contribute to his alcohol use.  He reports his mood has been generally stable, and we agreed to continue his current doses of Topamax, Zyprexa, doxepin and lithium.  No acute safety issues at this time. Disclosed to patient that this Probation officer is leaving this practice at the end of August 2019, and patients always has the right to choose their provider. Reassured patient that office will work to provide smooth transition of care whether they wish to remain at this office, or to continue with this provider, or seek alternative care options in community.  They expressed understanding.   1. Alcohol use disorder, moderate, dependence ([place])   2. Bipolar disorder, in partial remission, most recent episode hypomanic  ([place])     Status of current problems: unchanged  Labs Ordered: No orders of the defined types were placed in this encounter.   Labs Reviewed: n/a  Collateral Obtained/Records Reviewed: n/a  Plan:  Continue doxepin 50 mg nightly, Zyprexa 5 mg nightly, lithium 450 mg nightly Increase Prozac to 40 mg daily Continue Topamax 100 mg nightly Return to clinic in 8 weeks  Aundra Dubin, MD 07/20/2017, 4:29 PM

## 2017-08-08 NOTE — Progress Notes (Deleted)
HPI:  Using dictation device. Unfortunately this device frequently misinterprets words/phrases.  Thomas Williamson is a pleasant 42 y.o. here for follow up. Chronic medical problems summarized below were reviewed for changes and stability and were updated as needed below. These issues and their treatment remain stable for the most part. ***. Denies CP, SOB, DOE, treatment intolerance or new symptoms. Due for labs  Hypertension: -meds:losartan-hctz  HLD: -meds: pravastin  GERD: -meds: omeprazole  Bipolar d/o/Alcohol use disorder: -seeing Dr. Sharyon Medicus, psychiatry for management -meds: doxepin, fluoxetine, lithium, zyprexa, topamax  Hep C: -s/p treatment ROS: See pertinent positives and negatives per HPI.  Past Medical History:  Diagnosis Date  . Allergy   . Anxiety   . GERD (gastroesophageal reflux disease)   . Hypertension   . IBS (irritable bowel syndrome)   . PONV (postoperative nausea and vomiting)     Past Surgical History:  Procedure Laterality Date  . ANTERIOR CRUCIATE LIGAMENT REPAIR  2010  . INGUINAL HERNIA REPAIR  02/23/2012   Procedure: LAPAROSCOPIC BILATERAL INGUINAL HERNIA REPAIR;  Surgeon: Shann Medal, MD;  Location: [facility];  Service: General;  Laterality: Bilateral;  Laparoscopic Bilateral Inguinal Hernia Repair with mesh  . INSERTION OF MESH  02/23/2012   Procedure: INSERTION OF MESH;  Surgeon: Shann Medal, MD;  Location: [facility];  Service: General;  Laterality: N/A;  . NOSE SURGERY  2000,2012    Family History  Problem Relation Age of Onset  . Hypertension Mother   . Hypertension Father     SOCIAL HX: ***   Current Outpatient Medications:  .  doxepin (SINEQUAN) 50 MG capsule, Take 1 capsule (50 mg total) by mouth at bedtime., Disp: 90 capsule, Rfl: 1 .  FLUoxetine (PROZAC) 40 MG capsule, Take 1 capsule (40 mg total) by mouth daily., Disp: 90 capsule, Rfl: 0 .  lithium carbonate (ESKALITH) 450 MG CR tablet, Take 1 tablet (450 mg total) by mouth at  bedtime., Disp: 90 tablet, Rfl: 0 .  losartan-hydrochlorothiazide (HYZAAR) 50-12.5 MG tablet, TAKE 1 TABLET TWICE A DAY, Disp: 180 tablet, Rfl: 0 .  OLANZapine (ZYPREXA) 5 MG tablet, Take 1 tablet (5 mg total) by mouth at bedtime., Disp: 90 tablet, Rfl: 0 .  omeprazole (PRILOSEC) 20 MG capsule, Take 20 mg by mouth daily., Disp: , Rfl:  .  ondansetron (ZOFRAN) 4 MG tablet, Take 1 tablet (4 mg total) by mouth every 8 (eight) hours as needed for nausea or vomiting. (Patient not taking: Reported on 07/20/2017), Disp: 20 tablet, Rfl: 0 .  pravastatin (PRAVACHOL) 10 MG tablet, TAKE 1 TABLET (10 MG TOTAL) BY MOUTH DAILY., Disp: 90 tablet, Rfl: 1 .  topiramate (TOPAMAX) 100 MG tablet, Take 1 tablet (100 mg total) by mouth at bedtime., Disp: 90 tablet, Rfl: 0 .  valACYclovir (VALTREX) 1000 MG tablet, TAKE 1 TABLET (1,000 MG TOTAL) BY MOUTH 2 (TWO) TIMES DAILY., Disp: 20 tablet, Rfl: 0  EXAM:  There were no vitals filed for this visit.  There is no height or weight on file to calculate BMI.  GENERAL: vitals reviewed and listed above, alert, oriented, appears well hydrated and in no acute distress  HEENT: atraumatic, conjunttiva clear, no obvious abnormalities on inspection of external nose and ears  NECK: no obvious masses on inspection  LUNGS: clear to auscultation bilaterally, no wheezes, rales or rhonchi, good air movement  CV: HRRR, no peripheral edema  MS: moves all extremities without noticeable abnormality *** PSYCH: pleasant and cooperative, no obvious depression or anxiety  ASSESSMENT AND PLAN:  Discussed the following assessment and plan:  No diagnosis found.  *** -Patient advised to return or notify a doctor immediately if symptoms worsen or persist or new concerns arise.  There are no Patient Instructions on file for this visit.  Lucretia Kern, DO

## 2017-08-10 ENCOUNTER — Ambulatory Visit: Payer: BLUE CROSS/BLUE SHIELD | Admitting: Family Medicine

## 2017-08-10 DIAGNOSIS — Z0289 Encounter for other administrative examinations: Secondary | ICD-10-CM

## 2017-09-14 ENCOUNTER — Ambulatory Visit (HOSPITAL_COMMUNITY): Payer: Self-pay | Admitting: Psychiatry

## 2017-09-24 DIAGNOSIS — B182 Chronic viral hepatitis C: Secondary | ICD-10-CM | POA: Diagnosis not present

## 2017-09-30 DIAGNOSIS — B182 Chronic viral hepatitis C: Secondary | ICD-10-CM | POA: Diagnosis not present

## 2017-10-01 ENCOUNTER — Ambulatory Visit (HOSPITAL_COMMUNITY): Payer: Self-pay | Admitting: Psychiatry

## 2017-10-05 ENCOUNTER — Encounter: Payer: Self-pay | Admitting: Family Medicine

## 2017-10-05 DIAGNOSIS — Z8619 Personal history of other infectious and parasitic diseases: Secondary | ICD-10-CM

## 2017-10-05 HISTORY — DX: Personal history of other infectious and parasitic diseases: Z86.19

## 2017-10-18 DIAGNOSIS — F3181 Bipolar II disorder: Secondary | ICD-10-CM | POA: Diagnosis not present

## 2017-10-18 DIAGNOSIS — F329 Major depressive disorder, single episode, unspecified: Secondary | ICD-10-CM | POA: Diagnosis not present

## 2017-10-18 DIAGNOSIS — F1010 Alcohol abuse, uncomplicated: Secondary | ICD-10-CM | POA: Diagnosis not present

## 2017-10-23 ENCOUNTER — Other Ambulatory Visit: Payer: Self-pay | Admitting: Family Medicine

## 2017-11-06 ENCOUNTER — Other Ambulatory Visit: Payer: Self-pay | Admitting: Family Medicine

## 2017-11-30 DIAGNOSIS — F319 Bipolar disorder, unspecified: Secondary | ICD-10-CM | POA: Diagnosis not present

## 2017-12-10 ENCOUNTER — Ambulatory Visit (HOSPITAL_COMMUNITY): Payer: 59 | Admitting: Psychiatry

## 2018-01-06 ENCOUNTER — Telehealth: Payer: Self-pay | Admitting: Family Medicine

## 2018-01-06 NOTE — Telephone Encounter (Signed)
Ok to refill bp med x 30 days to appt.

## 2018-01-06 NOTE — Telephone Encounter (Signed)
Copied from [place] [id number]. Topic: Appointment Scheduling - Scheduling Inquiry for Clinic >> Jan 06, 2018  2:28 PM Sheran Luz wrote: Reason for CRM: Patient called stating he is being released from a rehabilitation program in [name] and would like to schedule appointment with Dr. Maudie Mercury on 12/12. Advised patient that appointment would need approval from Dr. Maudie Mercury as there are only same day acute slots open. Patient would like to know if he can be worked in, if possible as he is needing to discuss clearance to go back to work as soon as possible. Please advise.

## 2018-01-06 NOTE — Telephone Encounter (Signed)
Copied from [place] [id number]. Topic: Quick Communication - Rx Refill/Question >> Jan 06, 2018  2:31 PM Sheran Luz wrote: Medication: losartan-hydrochlorothiazide (HYZAAR) 100-25 MG tablet   Patient is requesting refill of this medication.     Preferred Pharmacy (with phone number or street name): [id number] - [place], [place]. AT [address] [phone] (Phone) [phone] (Fax)    Agent: Please be advised that RX refills may take up to 3 business days. We ask that you follow-up with your pharmacy.

## 2018-01-06 NOTE — Telephone Encounter (Signed)
Spoke with Shirlean Mylar at [facility] who states that the pt does have a refill available but the medication is on backorder. Pt would need to have two separate prescriptions to be sent in to the pharmacy, one for Losartan 100 mg tab and Hydrochlorothiazide 25 mg tab. [facility] [address] at [address] of [address].

## 2018-01-06 NOTE — Telephone Encounter (Signed)
Will need 30 minutes when available.  would not be able to provide clearance for this - would need to come from treating specialist from the rehab center he was released from. Does he need referral for specialist here? Do we have records for admission/reason for admission? If psychiatric, alcohol or drug related - we do not manage this through our clinic and would need behavioral health referral.

## 2018-01-06 NOTE — Telephone Encounter (Signed)
I called the pt and informed him of the message below.  He stated he was released from a rehab center for alcohol, has an appt with a psychiatrist here on 1/25 and needed a note to return to work.  I advised the pt per Dr Maudie Mercury unfortunately she would not be able to give this note as she does not treat him for this problem and advised the contact the rehab center or the psychiatrist.  Appt scheduled on 12/19 for a follow up on blood pressure and the pt stated he will run out of his medication prior to this appt.  Message sent to Dr Maudie Mercury for refill on Losartan/HCTZ as I only see where this was prescribed by Dr Sherren Mocha.

## 2018-01-07 MED ORDER — LOSARTAN POTASSIUM-HCTZ 100-25 MG PO TABS: 0.5000 | ORAL_TABLET | Freq: Two times a day (BID) | ORAL | 0 refills | Status: DC

## 2018-01-07 NOTE — Telephone Encounter (Signed)
Rx done. 

## 2018-01-12 ENCOUNTER — Telehealth: Payer: Self-pay | Admitting: *Deleted

## 2018-01-12 MED ORDER — LOSARTAN POTASSIUM 100 MG PO TABS: 100.0000 mg | ORAL_TABLET | Freq: Every day | ORAL | 0 refills | Status: DC

## 2018-01-12 MED ORDER — HYDROCHLOROTHIAZIDE 25 MG PO TABS: 25.0000 mg | ORAL_TABLET | Freq: Every day | ORAL | 0 refills | Status: DC

## 2018-01-12 NOTE — Telephone Encounter (Signed)
[facility] stating Losartan HCTZ 100-25mg  is unavailable and requested to send as 2 separate Rxs.  Message sent to Dr Ethlyn Gallery as Dr Maudie Mercury is out of the office.

## 2018-01-12 NOTE — Telephone Encounter (Signed)
Rxs sent for 30-day supply as the pt is due for an appt.

## 2018-01-12 NOTE — Telephone Encounter (Signed)
Ok to send in separate rx's for patient. Can see if he prefers 30 day supply or 90 day. Unsure if combo med will be back in stock in 30 days.

## 2018-01-12 NOTE — Addendum Note (Signed)
Addended by: Agnes Lawrence on: 01/12/2018 02:50 PM   Modules accepted: Orders

## 2018-01-20 ENCOUNTER — Ambulatory Visit: Payer: BLUE CROSS/BLUE SHIELD | Admitting: Family Medicine

## 2018-01-20 DIAGNOSIS — Z0289 Encounter for other administrative examinations: Secondary | ICD-10-CM

## 2018-01-20 NOTE — Progress Notes (Deleted)
HPI:  Using dictation device. Unfortunately this device frequently misinterprets words/phrases.  Thomas Williamson is a pleasant 42 y.o. here for follow up. Chronic medical problems summarized below were reviewed for changes. ***. Denies CP, SOB, DOE, treatment intolerance or new symptoms.  Hypertension: -meds: losartan, hctz  HLD: -meds: pravastatin  Hx Anxiety, alcohol abuse, Depression: -meds: doxepin, prozac, lithium, olanzapine, topamax -managed by psychiatry -has done inpatient rehab several times in [place]  Hx Hepatitis C: -managed by [facility] at [facility] -s/p treatment in 2019  ROS: See pertinent positives and negatives per HPI.  Past Medical History:  Diagnosis Date  . Allergy   . Anxiety   . GERD (gastroesophageal reflux disease)   . Hx of hepatitis C 10/05/2017   -treated in 2019 -hepatology recommended no further surveillance needed except for LFTs with labs and see hepatologist if elevated  . Hypertension   . IBS (irritable bowel syndrome)   . PONV (postoperative nausea and vomiting)     Past Surgical History:  Procedure Laterality Date  . ANTERIOR CRUCIATE LIGAMENT REPAIR  2010  . INGUINAL HERNIA REPAIR  02/23/2012   Procedure: LAPAROSCOPIC BILATERAL INGUINAL HERNIA REPAIR;  Surgeon: Shann Medal, MD;  Location: [facility];  Service: General;  Laterality: Bilateral;  Laparoscopic Bilateral Inguinal Hernia Repair with mesh  . INSERTION OF MESH  02/23/2012   Procedure: INSERTION OF MESH;  Surgeon: Shann Medal, MD;  [facility]: [facility];  Service: General;  Laterality: N/A;  . NOSE SURGERY  2000,2012    Family History  Problem Relation Age of Onset  . Hypertension Mother   . Hypertension Father     SOCIAL HX: ***   Current Outpatient Medications:  .  doxepin (SINEQUAN) 50 MG capsule, Take 1 capsule (50 mg total) by mouth at bedtime., Disp: 90 capsule, Rfl: 1 .  FLUoxetine (PROZAC) 40 MG capsule, Take 1 capsule (40 mg total) by mouth daily., Disp:  90 capsule, Rfl: 0 .  hydrochlorothiazide (HYDRODIURIL) 25 MG tablet, Take 1 tablet (25 mg total) by mouth daily., Disp: 30 tablet, Rfl: 0 .  lithium carbonate (ESKALITH) 450 MG CR tablet, Take 1 tablet (450 mg total) by mouth at bedtime., Disp: 90 tablet, Rfl: 0 .  losartan (COZAAR) 100 MG tablet, Take 1 tablet (100 mg total) by mouth daily., Disp: 30 tablet, Rfl: 0 .  losartan-hydrochlorothiazide (HYZAAR) 50-12.5 MG tablet, TAKE 1 TABLET TWICE A DAY, Disp: 180 tablet, Rfl: 0 .  OLANZapine (ZYPREXA) 5 MG tablet, Take 1 tablet (5 mg total) by mouth at bedtime., Disp: 90 tablet, Rfl: 0 .  omeprazole (PRILOSEC) 20 MG capsule, Take 20 mg by mouth daily., Disp: , Rfl:  .  ondansetron (ZOFRAN) 4 MG tablet, Take 1 tablet (4 mg total) by mouth every 8 (eight) hours as needed for nausea or vomiting. (Patient not taking: Reported on 07/20/2017), Disp: 20 tablet, Rfl: 0 .  pravastatin (PRAVACHOL) 10 MG tablet, TAKE 1 TABLET (10 MG TOTAL) BY MOUTH DAILY., Disp: 90 tablet, Rfl: 1 .  topiramate (TOPAMAX) 100 MG tablet, Take 1 tablet (100 mg total) by mouth at bedtime., Disp: 90 tablet, Rfl: 0 .  valACYclovir (VALTREX) 1000 MG tablet, TAKE 1 TABLET (1,000 MG TOTAL) BY MOUTH 2 (TWO) TIMES DAILY., Disp: 20 tablet, Rfl: 0  EXAM:  There were no vitals filed for this visit.  There is no height or weight on file to calculate BMI.  GENERAL: vitals reviewed and listed above, alert, oriented, appears well hydrated and in no  acute distress  HEENT: atraumatic, conjunttiva clear, no obvious abnormalities on inspection of external nose and ears  NECK: no obvious masses on inspection  LUNGS: clear to auscultation bilaterally, no wheezes, rales or rhonchi, good air movement  CV: HRRR, no peripheral edema  MS: moves all extremities without noticeable abnormality *** PSYCH: pleasant and cooperative, no obvious depression or anxiety  ASSESSMENT AND PLAN:  Discussed the following assessment and plan:  No diagnosis  found.  *** -Patient advised to return or notify a doctor immediately if symptoms worsen or persist or new concerns arise.  There are no Patient Instructions on file for this visit.  Lucretia Kern, DO

## 2018-02-15 ENCOUNTER — Ambulatory Visit: Payer: BLUE CROSS/BLUE SHIELD | Admitting: Family Medicine

## 2018-02-15 ENCOUNTER — Other Ambulatory Visit: Payer: Self-pay | Admitting: Family Medicine

## 2018-02-15 NOTE — Telephone Encounter (Signed)
Dr. Maudie Mercury Patient

## 2018-02-16 NOTE — Progress Notes (Signed)
HPI:  Using dictation device. Unfortunately this device frequently misinterprets words/phrases.  Thomas Williamson is is here for follow up hypertension: -not seen in some time -meds: losartan, hctz, pravastatin -was in rehab for alcohol in 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 Checked himself in for inpatient then oupt rehab for alcohol in [place]. -reports doing great now. On no psych meds currently. Has follow up with Pscyh on the 29th of this month - that is the soonest they could see him. -no alcohol use since discharge, no depression or mania -occ anxiety with the classes, but report actually in a good place, excited about career change and good family support -hx hyperlipidemia  -sees psychiatry for management bipolar depression and alcohol abuse -hx treated hepatitis C  ROS: See pertinent positives and negatives per HPI.  Past Medical History:  Diagnosis Date  . Allergy   . Anxiety   . GERD (gastroesophageal reflux disease)   . Hx of hepatitis C 10/05/2017   -treated in 2019 -hepatology recommended no further surveillance needed except for LFTs with labs and see hepatologist if elevated  . Hypertension   . IBS (irritable bowel syndrome)   . PONV (postoperative nausea and vomiting)     Past Surgical History:  Procedure Laterality Date  . ANTERIOR CRUCIATE LIGAMENT REPAIR  2010  . INGUINAL HERNIA REPAIR  02/23/2012   Procedure: LAPAROSCOPIC BILATERAL INGUINAL HERNIA REPAIR;  Surgeon: Shann Medal, MD;  Location: [facility];  Service: General;  Laterality: Bilateral;  Laparoscopic Bilateral Inguinal Hernia Repair with mesh  . INSERTION OF MESH  02/23/2012   Procedure: INSERTION OF MESH;  Surgeon: Shann Medal, MD;  Location: [facility];  Service: General;  Laterality: N/A;  . NOSE SURGERY  2000,2012    Family History  Problem Relation Age of Onset  . Hypertension Mother   . Hypertension Father     SOCIAL HX: see  hpi   Current Outpatient Medications:  .  doxepin (SINEQUAN) 50 MG capsule, Take 1 capsule (50 mg total) by mouth at bedtime., Disp: 90 capsule, Rfl: 1 .  FLUoxetine (PROZAC) 40 MG capsule, Take 1 capsule (40 mg total) by mouth daily., Disp: 90 capsule, Rfl: 0 .  hydrochlorothiazide (HYDRODIURIL) 25 MG tablet, Take 1 tablet (25 mg total) by mouth daily., Disp: 30 tablet, Rfl: 0 .  lithium carbonate (ESKALITH) 450 MG CR tablet, Take 1 tablet (450 mg total) by mouth at bedtime., Disp: 90 tablet, Rfl: 0 .  losartan (COZAAR) 100 MG tablet, TAKE 1 TABLET BY MOUTH EVERY DAY, Disp: 30 tablet, Rfl: 0 .  OLANZapine (ZYPREXA) 5 MG tablet, Take 1 tablet (5 mg total) by mouth at bedtime., Disp: 90 tablet, Rfl: 0 .  omeprazole (PRILOSEC) 20 MG capsule, Take 20 mg by mouth daily., Disp: , Rfl:  .  pravastatin (PRAVACHOL) 10 MG tablet, TAKE 1 TABLET (10 MG TOTAL) BY MOUTH DAILY., Disp: 90 tablet, Rfl: 1 .  valACYclovir (VALTREX) 1000 MG tablet, TAKE 1 TABLET (1,000 MG TOTAL) BY MOUTH 2 (TWO) TIMES DAILY., Disp: 20 tablet, Rfl: 0  EXAM:  Vitals:   02/17/18 1426  BP: 110/80  Pulse: 95  Temp: 97.8 F (36.6 C)    Body mass index is 28.28 kg/m.  GENERAL: vitals reviewed and listed above, alert, oriented, appears well hydrated and in no acute distress  HEENT: atraumatic, conjunttiva clear, no obvious abnormalities on inspection of external nose and ears  NECK:  no obvious masses on inspection  LUNGS: clear to auscultation bilaterally, no wheezes, rales or rhonchi, good air movement  CV: HRRR, no peripheral edema  MS: moves all extremities without noticeable abnormality  PSYCH: pleasant and cooperative, no obvious depression or anxiety  ASSESSMENT AND PLAN:  Discussed the following assessment and plan:  Essential hypertension - Plan: Comprehensive metabolic panel, CBC  Lipids abnormal  Gastroesophageal reflux disease, esophagitis presence not specified  History of alcohol use  -labs per  orders -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 - will lose job if waits to see psychiatry - needs work note - provided. -advised to keep psych appointment -cont current meds -he is starting exercise program - encouraged regular exercise and health diet -follow up 3-4 months  -Patient advised to return or notify a doctor immediately if symptoms worsen or persist or new concerns arise.  Patient Instructions  BEFORE YOU LEAVE: -flu shot -labs -work note, seen and may return to work -follow up: 3-4 months  See the psychiatrist as planned.  Continue current blood pressure medications.  Healthy low sugar diet and regular aerobic exercise.  We have ordered labs or studies at this visit. It can take up to 1-2 weeks for results and processing. IF results require follow up or explanation, we will call you with instructions. Clinically stable results will be released to your [facility]. If you have not heard from [name] or cannot find your results in [facility] in 2 weeks please contact our office at [phone].  If you are not yet signed up for [facility], please consider signing up.   We recommend the following healthy lifestyle for LIFE: 1) Small portions. But, make sure to get regular (at least 3 per day), healthy meals and small healthy snacks if needed.  2) Eat a healthy clean diet.   TRY TO EAT: -at least 5-7 servings of low sugar, colorful, and nutrient rich vegetables per day (not corn, potatoes or bananas.) -berries are the best choice if you wish to eat fruit (only eat small amounts if trying to reduce weight)  -lean meets (fish, white meat of chicken or [place]) -vegan proteins for some meals - beans or tofu, whole grains, nuts and seeds -Replace bad fats with good fats - good fats include: fish, nuts and seeds, canola oil, olive oil -small amounts of low fat or non fat dairy -small amounts of100 % whole grains - check the lables -drink plenty of water  AVOID: -SUGAR, sweets,  anything with added sugar, corn syrup or sweeteners - must read labels as even foods advertised as "healthy" often are loaded with sugar -if you must have a sweetener, small amounts of stevia may be best -sweetened beverages and artificially sweetened beverages -simple starches (rice, bread, potatoes, pasta, chips, etc - small amounts of 100% whole grains are ok) -red meat, pork, butter -fried foods, fast food, processed food, excessive dairy, eggs and coconut.  3)Get at least 150 minutes of sweaty aerobic exercise per week.  4)Reduce stress - consider counseling, meditation and relaxation to balance other aspects of your life.    Lucretia Kern, DO

## 2018-02-17 ENCOUNTER — Encounter: Payer: Self-pay | Admitting: Family Medicine

## 2018-02-17 ENCOUNTER — Ambulatory Visit: Payer: BLUE CROSS/BLUE SHIELD | Admitting: Family Medicine

## 2018-02-17 ENCOUNTER — Encounter: Payer: Self-pay | Admitting: *Deleted

## 2018-02-17 VITALS — BP 110/80 | HR 95 | Temp 97.8°F | Ht 70.0 in | Wt 197.1 lb

## 2018-02-17 DIAGNOSIS — Z87898 Personal history of other specified conditions: Secondary | ICD-10-CM | POA: Diagnosis not present

## 2018-02-17 DIAGNOSIS — E7889 Other lipoprotein metabolism disorders: Secondary | ICD-10-CM | POA: Diagnosis not present

## 2018-02-17 DIAGNOSIS — K219 Gastro-esophageal reflux disease without esophagitis: Secondary | ICD-10-CM

## 2018-02-17 DIAGNOSIS — I10 Essential (primary) hypertension: Secondary | ICD-10-CM

## 2018-02-17 LAB — CBC
HCT: 39.9 % (ref 39.0–52.0)
Hemoglobin: 13.3 g/dL (ref 13.0–17.0)
MCHC: 33.4 g/dL (ref 30.0–36.0)
MCV: 93.3 fl (ref 78.0–100.0)
Platelets: 305 10*3/uL (ref 150.0–400.0)
RBC: 4.27 Mil/uL (ref 4.22–5.81)
RDW: 15 % (ref 11.5–15.5)
WBC: 8.6 10*3/uL (ref 4.0–10.5)

## 2018-02-17 LAB — COMPREHENSIVE METABOLIC PANEL
ALK PHOS: 90 U/L (ref 39–117)
ALT: 15 U/L (ref 0–53)
AST: 12 U/L (ref 0–37)
Albumin: 5 g/dL (ref 3.5–5.2)
BILIRUBIN TOTAL: 0.5 mg/dL (ref 0.2–1.2)
BUN: 15 mg/dL (ref 6–23)
CO2: 29 meq/L (ref 19–32)
Calcium: 10.7 mg/dL — ABNORMAL HIGH (ref 8.4–10.5)
Chloride: 100 mEq/L (ref 96–112)
Creatinine, Ser: 0.94 mg/dL (ref 0.40–1.50)
GFR: 87.7 mL/min (ref >60.00)
GLUCOSE: 79 mg/dL (ref 70–99)
Potassium: 3.9 mEq/L (ref 3.5–5.1)
Sodium: 138 mEq/L (ref 135–145)
Total Protein: 7.9 g/dL (ref 6.0–8.3)

## 2018-02-17 NOTE — Patient Instructions (Addendum)
BEFORE YOU LEAVE: -flu shot -labs -work note, seen and may return to work -follow up: 3-4 months  See the psychiatrist as planned.  Continue current blood pressure medications.  Healthy low sugar diet and regular aerobic exercise.  We have ordered labs or studies at this visit. It can take up to 1-2 weeks for results and processing. IF results require follow up or explanation, we will call you with instructions. Clinically stable results will be released to your [facility]. If you have not heard from [name] or cannot find your results in [facility] in 2 weeks please contact our office at [phone].  If you are not yet signed up for [facility], please consider signing up.   We recommend the following healthy lifestyle for LIFE: 1) Small portions. But, make sure to get regular (at least 3 per day), healthy meals and small healthy snacks if needed.  2) Eat a healthy clean diet.   TRY TO EAT: -at least 5-7 servings of low sugar, colorful, and nutrient rich vegetables per day (not corn, potatoes or bananas.) -berries are the best choice if you wish to eat fruit (only eat small amounts if trying to reduce weight)  -lean meets (fish, white meat of chicken or [place]) -vegan proteins for some meals - beans or tofu, whole grains, nuts and seeds -Replace bad fats with good fats - good fats include: fish, nuts and seeds, canola oil, olive oil -small amounts of low fat or non fat dairy -small amounts of100 % whole grains - check the lables -drink plenty of water  AVOID: -SUGAR, sweets, anything with added sugar, corn syrup or sweeteners - must read labels as even foods advertised as "healthy" often are loaded with sugar -if you must have a sweetener, small amounts of stevia may be best -sweetened beverages and artificially sweetened beverages -simple starches (rice, bread, potatoes, pasta, chips, etc - small amounts of 100% whole grains are ok) -red meat, pork, butter -fried foods, fast food, processed  food, excessive dairy, eggs and coconut.  3)Get at least 150 minutes of sweaty aerobic exercise per week.  4)Reduce stress - consider counseling, meditation and relaxation to balance other aspects of your life.

## 2018-02-21 NOTE — Addendum Note (Signed)
Addended by: Agnes Lawrence on: 02/21/2018 03:15 PM   Modules accepted: Orders

## 2018-02-24 NOTE — Progress Notes (Signed)
BH MD/PA/NP OP Progress Note  02/26/2018 11:51 AM Thomas Williamson  MRN:  [id number]  Chief Complaint:  Chief Complaint    Follow-up; Medication Refill; Alcohol Problem     HPI:  Thomas Williamson is a 43 y.o. year old male with a history of bipolar I disorder, alcohol use disorder, hypertension, hyperlipidemia, history of hepatitis C , who presents for follow up appointment for bipolar disorder and alcohol use disorder. He was a patient of Dr. Daron Offer.  He states that he relapsed on alcohol in September.  He went to rehab facility in [name], followed by IOP, and sober house.  He drank 2/5 of liquor on Christmas.  Although he is motivated for sobriety, he tends to drink alcohol as it is difficult to think about his life.  He states that he is single, and 43 year old.  His longest sobriety was for 3 months.  He abuses alcohol even while he was seen by Dr. Daron Offer.  He has had escalated alcohol use when he used to work as a Garment/textile technologist in jail.  He drank to forget about things. He reports avoidance while he denies any injury to self and others.  He feels frustrated that he has been out of fluoxetine and could not get into the clinic for appointment.  Although he was started on Trintellix in [name], which worked better, he could not afford it.  He is interested in trying other antidepressant. He goes to school and works at [employer]. He reports good relationship with his parents in [place].   He feels depressed. He sleeps well with doxepin.  He has fair energy and motivation.  He denies SI.  He feels anxious at times.  He denies panic attacks.  He denies decreased need for euphoria sleep.  He has impulsive behaviors, which includes alcohol abuse.  He uses marijuana every few days as it "mellow me out." He denies any motivation for sobriety from marijuana use. He used to use cocaine in his 20's. He denies history of seizure. He had alcohol withdrawal/tremors.  Wt Readings from Last 3 Encounters:  02/26/18 196 lb  (88.9 kg)  02/17/18 197 lb 1.6 oz (89.4 kg)  07/20/17 192 lb 9.6 oz (87.4 kg)    Visit Diagnosis:    ICD-10-CM   1. Alcohol use disorder, moderate, dependence (HCC) F10.20   2. Bipolar disorder, in partial remission, most recent episode depressed (HCC) F31.75 OLANZapine (ZYPREXA) 5 MG tablet    TSH    Lithium level    Comprehensive Metabolic Panel (CMET)    Past Psychiatric History:  Please see initial evaluation for full details. I have reviewed the history. No updates at this time.     Past Medical History:  Past Medical History:  Diagnosis Date  . Allergy   . Anxiety   . Bipolar disorder ([place])   . Depression   . GERD (gastroesophageal reflux disease)   . Hx of hepatitis C 10/05/2017   -treated in 2019 -hepatology recommended no further surveillance needed except for LFTs with labs and see hepatologist if elevated  . Hypertension   . IBS (irritable bowel syndrome)   . PONV (postoperative nausea and vomiting)     Past Surgical History:  Procedure Laterality Date  . ANTERIOR CRUCIATE LIGAMENT REPAIR  2010  . INGUINAL HERNIA REPAIR  02/23/2012   Procedure: LAPAROSCOPIC BILATERAL INGUINAL HERNIA REPAIR;  Surgeon: Shann Medal, MD;  Location: [facility];  Service: General;  Laterality: Bilateral;  Laparoscopic Bilateral Inguinal Hernia Repair with mesh  .  INSERTION OF MESH  02/23/2012   Procedure: INSERTION OF MESH;  Surgeon: Shann Medal, MD;  Location: [facility];  Service: General;  Laterality: N/A;  . NOSE SURGERY  [id number]    Family Psychiatric History:  Please see initial evaluation for full details. I have reviewed the history. No updates at this time.     Family History:  Family History  Problem Relation Age of Onset  . Hypertension Mother   . Hypertension Father     Social History:  Social History   Socioeconomic History  . Marital status: Single    Spouse name: Not on file  . Number of children: 0  . Years of education: Not on file  . Highest education  level: Bachelor's degree (e.g., BA, AB, BS)  Occupational History  . Not on file  Social Needs  . Financial resource strain: Somewhat hard  . Food insecurity:    Worry: Sometimes true    Inability: Sometimes true  . Transportation needs:    Medical: No    Non-medical: No  Tobacco Use  . Smoking status: Never Smoker  . Smokeless tobacco: Never Used  Substance and Sexual Activity  . Alcohol use: Not Currently    Alcohol/week: 8.0 standard drinks    Types: 8 Shots of liquor per week  . Drug use: Yes    Types: Marijuana    Comment: week ago  . Sexual activity: Not Currently  Lifestyle  . Physical activity:    Days per week: 0 days    Minutes per session: 0 min  . Stress: Very much  Relationships  . Social connections:    Talks on phone: More than three times a week    Gets together: Twice a week    Attends religious service: Never    Active member of club or organization: Yes    Attends meetings of clubs or organizations: More than 4 times per year    Relationship status: Never married  Other Topics Concern  . Not on file  Social History Narrative  . Not on file    Allergies:  Allergies  Allergen Reactions  . Hydrocodone     UPSETS STOMACH    Metabolic Disorder Labs: No results found for: HGBA1C, MPG No results found for: PROLACTIN Lab Results  Component Value Date   CHOL 208 (H) 03/31/2016   TRIG 252.0 (H) 03/31/2016   HDL 45.10 03/31/2016   CHOLHDL 5 03/31/2016   VLDL 50.4 (H) 03/31/2016   Lab Results  Component Value Date   TSH 0.74 03/31/2016   TSH 0.48 09/20/2013    Therapeutic Level Labs: Lab Results  Component Value Date   LITHIUM 0.6 12/18/2016   No results found for: VALPROATE No components found for:  CBMZ  Current Medications: Current Outpatient Medications  Medication Sig Dispense Refill  . doxepin (SINEQUAN) 50 MG capsule Take 1 capsule (50 mg total) by mouth at bedtime. 90 capsule 1  . hydrochlorothiazide (HYDRODIURIL) 25 MG  tablet Take 1 tablet (25 mg total) by mouth daily. 30 tablet 0  . lithium carbonate (ESKALITH) 450 MG CR tablet Take 1 tablet (450 mg total) by mouth at bedtime. 90 tablet 0  . losartan (COZAAR) 100 MG tablet TAKE 1 TABLET BY MOUTH EVERY DAY 30 tablet 0  . OLANZapine (ZYPREXA) 5 MG tablet Take 1 tablet (5 mg total) by mouth at bedtime. 90 tablet 0  . omeprazole (PRILOSEC) 20 MG capsule Take 20 mg by mouth daily.    Marland Kitchen  pravastatin (PRAVACHOL) 10 MG tablet TAKE 1 TABLET (10 MG TOTAL) BY MOUTH DAILY. 90 tablet 1  . valACYclovir (VALTREX) 1000 MG tablet TAKE 1 TABLET (1,000 MG TOTAL) BY MOUTH 2 (TWO) TIMES DAILY. 20 tablet 0  . doxepin (SINEQUAN) 50 MG capsule Take 1 capsule (50 mg total) by mouth at bedtime as needed (for sleep). 90 capsule 0  . escitalopram (LEXAPRO) 10 MG tablet Take 1 tablet (10 mg total) by mouth daily. 30 tablet 1  . naltrexone (DEPADE) 50 MG tablet Take 1 tablet (50 mg total) by mouth daily. 30 tablet 1   No current facility-administered medications for this visit.      Musculoskeletal: Strength & Muscle Tone: within normal limits Gait & Station: normal Patient leans: N/A  Psychiatric Specialty Exam: Review of Systems  Psychiatric/Behavioral: Positive for depression and substance abuse. Negative for hallucinations, memory loss and suicidal ideas. The patient is nervous/anxious. The patient does not have insomnia.   All other systems reviewed and are negative.   Blood pressure 126/85, pulse (!) 119, temperature 97.6 F (36.4 C), temperature source Oral, height 5\' 10"  (1.778 m), weight 196 lb (88.9 kg).Body mass index is 28.12 kg/m.  General Appearance: Fairly Groomed  Eye Contact:  Good  Speech:  Clear and Coherent  Volume:  Normal  Mood:  Depressed  Affect:  Appropriate, Congruent, Restricted and Tearful  Thought Process:  Coherent  Orientation:  Full (Time, Place, and Person)  Thought Content: Logical   Suicidal Thoughts:  No  Homicidal Thoughts:  No   Memory:  Immediate;   Good  Judgement:  Good  Insight:  Fair  Psychomotor Activity:  Normal  Concentration:  Concentration: Good and Attention Span: Good  Recall:  Good  Fund of Knowledge: Good  Language: Good  Akathisia:  No  Handed:  Right  AIMS (if indicated): not done  Assets:  Communication Skills Desire for Improvement  ADL's:  Intact  Cognition: WNL  Sleep:  Good   Screenings:   Assessment and Plan:  Thomas Williamson is a 43 y.o. year old male with a history of bipolar I disorder, alcohol use disorder, hypertension, hyperlipidemia, history of hepatitis C , who presents for follow up appointment for bipolar disorder and alcohol use disorder. He was a patient of Dr. Daron Offer.  # Bipolar I disorder # r/o substance induced mood disorder Patient reports depressive symptoms and anxiety, which coincided with recent relapsing alcohol and running out of fluoxetine.  Given patient reports preference to try other antidepressant, will try Lexapro.  Will continue lithium for bipolar disorder.  Will obtain blood test to rule out any potential side effect.  Will continue doxepin as needed for insomnia.  He will greatly benefit from CBT and also to work on abstinence from alcohol.  Will make referral inside the clinic.   # Alcohol use disorder Patient relapsed on alcohol in September 2019.  Last drink was on Christmas.  He is motivated for sobriety.  Will reinitiate naltrexone for alcohol use disorder.  Discussed potential GI side effect.  Will obtain liver panel.  Although he will greatly benefit from IOP, he is not interested in this option.   Plan 1. Start lexapro 10 mg daily  2. Continue lithium 450 mg daily 3. Continue olanzapine 5 mg daily  4. Reinitiate naltrexone 50 mg daily 5. Continue doxepin 50 mg at night as needed for insomnia 5. Referral to therapy  - obtain TSH, CMP, lithium (obtain blood test at [facility] before taking the morning  dose) 6. Return to clinic in one month   The  patient demonstrates the following risk factors for suicide: Chronic risk factors for suicide include: psychiatric disorder of bipolar disorder and substance use disorder. Acute risk factors for suicide include: N/A. Protective factors for this patient include: positive social support and hope for the future. Considering these factors, the overall suicide risk at this point appears to be low. Patient is appropriate for outpatient follow up.  The duration of this appointment visit was 25 minutes of face-to-face time with the patient.  Greater than 50% of this time was spent in counseling, explanation of  diagnosis, planning of further management, and coordination of care.  Norman Clay, MD 02/26/2018, 11:51 AM

## 2018-02-26 ENCOUNTER — Ambulatory Visit (HOSPITAL_COMMUNITY): Payer: BLUE CROSS/BLUE SHIELD | Admitting: Psychiatry

## 2018-02-26 ENCOUNTER — Ambulatory Visit (INDEPENDENT_AMBULATORY_CARE_PROVIDER_SITE_OTHER): Payer: BLUE CROSS/BLUE SHIELD | Admitting: Psychiatry

## 2018-02-26 ENCOUNTER — Encounter (HOSPITAL_COMMUNITY): Payer: Self-pay | Admitting: Psychiatry

## 2018-02-26 ENCOUNTER — Other Ambulatory Visit: Payer: Self-pay

## 2018-02-26 VITALS — BP 126/85 | HR 119 | Temp 97.6°F | Ht 70.0 in | Wt 196.0 lb

## 2018-02-26 DIAGNOSIS — F3175 Bipolar disorder, in partial remission, most recent episode depressed: Secondary | ICD-10-CM | POA: Diagnosis not present

## 2018-02-26 DIAGNOSIS — F1020 Alcohol dependence, uncomplicated: Secondary | ICD-10-CM | POA: Diagnosis not present

## 2018-02-26 MED ORDER — ESCITALOPRAM OXALATE 10 MG PO TABS: 10.0000 mg | ORAL_TABLET | Freq: Every day | ORAL | 1 refills | Status: DC

## 2018-02-26 MED ORDER — DOXEPIN HCL 50 MG PO CAPS: 50.0000 mg | ORAL_CAPSULE | Freq: Every evening | ORAL | 0 refills | Status: DC | PRN

## 2018-02-26 MED ORDER — OLANZAPINE 5 MG PO TABS: 5.0000 mg | ORAL_TABLET | Freq: Every day | ORAL | 0 refills | Status: DC

## 2018-02-26 MED ORDER — NALTREXONE HCL 50 MG PO TABS: 50.0000 mg | ORAL_TABLET | Freq: Every day | ORAL | 1 refills | Status: DC

## 2018-02-26 NOTE — Patient Instructions (Signed)
1. Start lexapro 10 mg daily  2. Continue lithium 450 mg daily 3. Continue olanzapine 5 mg daily  4. Reinitiate naltrexone 50 mg daily 5. Continue doxepine 50 mg at night   5. Referral to therapy  6. Return to clinic in one month

## 2018-03-18 ENCOUNTER — Other Ambulatory Visit: Payer: Self-pay | Admitting: Family Medicine

## 2018-03-21 ENCOUNTER — Other Ambulatory Visit (INDEPENDENT_AMBULATORY_CARE_PROVIDER_SITE_OTHER): Payer: BLUE CROSS/BLUE SHIELD

## 2018-03-22 LAB — PTH, INTACT AND CALCIUM
Calcium: 9.9 mg/dL (ref 8.6–10.3)
PTH: 64 pg/mL (ref 14–64)

## 2018-03-22 LAB — CALCIUM, IONIZED: Calcium, Ion: 5.37 mg/dL (ref 4.8–5.6)

## 2018-03-30 ENCOUNTER — Encounter (HOSPITAL_COMMUNITY): Payer: Self-pay | Admitting: Psychiatry

## 2018-03-30 ENCOUNTER — Ambulatory Visit (INDEPENDENT_AMBULATORY_CARE_PROVIDER_SITE_OTHER): Payer: BLUE CROSS/BLUE SHIELD | Admitting: Psychiatry

## 2018-03-30 VITALS — BP 142/80 | Ht 70.0 in | Wt 200.0 lb

## 2018-03-30 DIAGNOSIS — F3171 Bipolar disorder, in partial remission, most recent episode hypomanic: Secondary | ICD-10-CM

## 2018-03-30 DIAGNOSIS — F1021 Alcohol dependence, in remission: Secondary | ICD-10-CM

## 2018-03-30 DIAGNOSIS — F3176 Bipolar disorder, in full remission, most recent episode depressed: Secondary | ICD-10-CM | POA: Insufficient documentation

## 2018-03-30 DIAGNOSIS — F3175 Bipolar disorder, in partial remission, most recent episode depressed: Secondary | ICD-10-CM | POA: Diagnosis not present

## 2018-03-30 HISTORY — DX: Bipolar disorder, in partial remission, most recent episode hypomanic: F31.71

## 2018-03-30 HISTORY — DX: Alcohol dependence, in remission: F10.21

## 2018-03-30 MED ORDER — NALTREXONE HCL 50 MG PO TABS: 50.0000 mg | ORAL_TABLET | Freq: Every day | ORAL | 1 refills | Status: DC

## 2018-03-30 MED ORDER — ESCITALOPRAM OXALATE 20 MG PO TABS: 20.0000 mg | ORAL_TABLET | Freq: Every day | ORAL | 1 refills | Status: DC

## 2018-03-30 NOTE — Progress Notes (Signed)
BH MD/PA/NP OP Progress Note  03/30/2018 2:56 PM Thomas Williamson  MRN:  [id number]  Chief Complaint: Anxiety HPI: 43 yo male with long hx of alcohol problem drinking (in rehab twice) and bipolar disorder. He was a patient of Dr. Germaine Pomfret, saw Dr. Modesta Messing a month ago and now will be coming to our office for follow up. He had done well on Trintellix but could not afford it. He was then on fluoxetine which he ran out and was then switched by Dr. Modesta Messing to escitalopram. He tolerates it well and reports some improvement in anxiety level. He has now been sober for close to 3 months. Use to drink up to a fifth of liquor and reports experiencing blackouts. He has no hx of withdrawal seizures or DTs. He is compliant with his medications: lithium CR, olanzapine, doxepin prn sleep and naltrexone. He had lithium level checked last time in December while in rehab in [name] and was told it was therapeutic. He takes olanzapine for anxiety, no sedation but admits that it makes him hungry. He has a hx of hepatitis C and was effectuively treated with Harvoni. He denies ever using IV drugs - suspects he might have contracted it via unprotected sex.   Patient is studying to become radiology technician. Family in [name] is supportive.  Visit Diagnosis:    ICD-10-CM   1. Bipolar disorder, in partial remission, most recent episode depressed (Thomas Williamson) F31.75   2. Alcohol use disorder, severe, in early remission (Thomas Williamson) F10.21     Past Psychiatric History: Please refer to initial evaluation (H&P) for details; no updates required.  Past Medical History:  Past Medical History:  Diagnosis Date  . Allergy   . Anxiety   . Bipolar disorder ([place])   . Depression   . GERD (gastroesophageal reflux disease)   . Hx of hepatitis C 10/05/2017   -treated in 2019 -hepatology recommended no further surveillance needed except for LFTs with labs and see hepatologist if elevated  . Hypertension   . IBS (irritable bowel syndrome)   . PONV  (postoperative nausea and vomiting)     Past Surgical History:  Procedure Laterality Date  . ANTERIOR CRUCIATE LIGAMENT REPAIR  2010  . INGUINAL HERNIA REPAIR  02/23/2012   Procedure: LAPAROSCOPIC BILATERAL INGUINAL HERNIA REPAIR;  Surgeon: Shann Medal, MD;  Location: [facility];  Service: General;  Laterality: Bilateral;  Laparoscopic Bilateral Inguinal Hernia Repair with mesh  . INSERTION OF MESH  02/23/2012   Procedure: INSERTION OF MESH;  Surgeon: Shann Medal, MD;  Location: [facility];  Service: General;  Laterality: N/A;  . NOSE SURGERY  [id number]    Family Psychiatric History: None.  Family History:  Family History  Problem Relation Age of Onset  . Hypertension Mother   . Hypertension Father     Social History:  Social History   Socioeconomic History  . Marital status: Single    Spouse name: Not on file  . Number of children: 0  . Years of education: Not on file  . Highest education level: Bachelor's degree (e.g., BA, AB, BS)  Occupational History  . Not on file  Social Needs  . Financial resource strain: Somewhat hard  . Food insecurity:    Worry: Sometimes true    Inability: Sometimes true  . Transportation needs:    Medical: No    Non-medical: No  Tobacco Use  . Smoking status: Never Smoker  . Smokeless tobacco: Never Used  Substance and Sexual Activity  .  Alcohol use: Not Currently    Alcohol/week: 8.0 standard drinks    Types: 8 Shots of liquor per week  . Drug use: Yes    Types: Marijuana    Comment: week ago  . Sexual activity: Not Currently  Lifestyle  . Physical activity:    Days per week: 0 days    Minutes per session: 0 min  . Stress: Very much  Relationships  . Social connections:    Talks on phone: More than three times a week    Gets together: Twice a week    Attends religious service: Never    Active member of club or organization: Yes    Attends meetings of clubs or organizations: More than 4 times per year    Relationship status:  Never married  Other Topics Concern  . Not on file  Social History Narrative  . Not on file    Allergies:  Allergies  Allergen Reactions  . Hydrocodone     UPSETS STOMACH    Metabolic Disorder Labs: No results found for: HGBA1C, MPG No results found for: PROLACTIN Lab Results  Component Value Date   CHOL 208 (H) 03/31/2016   TRIG 252.0 (H) 03/31/2016   HDL 45.10 03/31/2016   CHOLHDL 5 03/31/2016   VLDL 50.4 (H) 03/31/2016   Lab Results  Component Value Date   TSH 0.74 03/31/2016   TSH 0.48 09/20/2013    Therapeutic Level Labs: Lab Results  Component Value Date   LITHIUM 0.6 12/18/2016   No results found for: VALPROATE No components found for:  CBMZ  Current Medications: Current Outpatient Medications  Medication Sig Dispense Refill  . doxepin (SINEQUAN) 50 MG capsule Take 1 capsule (50 mg total) by mouth at bedtime. 90 capsule 1  . escitalopram (LEXAPRO) 20 MG tablet Take 1 tablet (20 mg total) by mouth daily. 30 tablet 1  . hydrochlorothiazide (HYDRODIURIL) 25 MG tablet Take 1 tablet (25 mg total) by mouth daily. 30 tablet 0  . lithium carbonate (ESKALITH) 450 MG CR tablet Take 1 tablet (450 mg total) by mouth at bedtime. 90 tablet 0  . losartan (COZAAR) 100 MG tablet TAKE 1 TABLET BY MOUTH EVERY DAY 30 tablet 5  . naltrexone (DEPADE) 50 MG tablet Take 1 tablet (50 mg total) by mouth daily. 30 tablet 1  . OLANZapine (ZYPREXA) 5 MG tablet Take 1 tablet (5 mg total) by mouth at bedtime. 90 tablet 0  . omeprazole (PRILOSEC) 20 MG capsule Take 20 mg by mouth daily.    . pravastatin (PRAVACHOL) 10 MG tablet TAKE 1 TABLET (10 MG TOTAL) BY MOUTH DAILY. 90 tablet 1  . valACYclovir (VALTREX) 1000 MG tablet TAKE 1 TABLET (1,000 MG TOTAL) BY MOUTH 2 (TWO) TIMES DAILY. 20 tablet 0   No current facility-administered medications for this visit.      Musculoskeletal: Strength & Muscle Tone: within normal limits Gait & Station: normal Patient leans: N/A  Psychiatric  Specialty Exam: Review of Systems  Constitutional: Negative.   HENT: Negative.   Eyes: Negative.   Respiratory: Negative.   Cardiovascular: Negative.   Gastrointestinal: Negative.   Genitourinary: Negative.   Musculoskeletal: Negative.   Skin: Negative.   Neurological: Negative.   Endo/Heme/Allergies: Negative.   Psychiatric/Behavioral: The patient is nervous/anxious.     Blood pressure (!) 142/80, height 5\' 10"  (1.778 m), weight 200 lb (90.7 kg).Body mass index is 28.7 kg/m.  General Appearance: Casual and Fairly Groomed  Eye Contact:  Good  Speech:  Clear  and Coherent  Volume:  Increased  Mood:  Anxious  Affect:  Full Range  Thought Process:  Goal Directed  Orientation:  Full (Time, Place, and Person)  Thought Content: Logical   Suicidal Thoughts:  No  Homicidal Thoughts:  No  Memory:  Immediate;   Good Recent;   Good Remote;   Good  Judgement:  Intact  Insight:  Good  Psychomotor Activity:  Normal  Concentration:  Concentration: Good  Recall:  Good  Fund of Knowledge: Good  Language: Good  Akathisia:  Negative  Handed:  Right  AIMS (if indicated): not done  Assets:  Communication Skills Desire for Improvement Housing Social Support  ADL's:  Intact  Cognition: WNL  Sleep:  Good     Assessment and Plan: 43 yo male with BPAD and alcohol use disorder in early remission. Mood mildly anxious, sleep and appetite good. He does not use doxepin often. Denies feeling depressed, no psychosis. Bright affect, rapid but not pressured speech. He is interested in trying a higher dose of escitalopram to hopefully further improve anxiety. Plan: Increase escitalopram dose to 20 mg daily; move it to PM if it causes some fatigue. Continue other medications unchanged. Return to clinic in 2 months. We will recheck lithium level (as well as creatinine, TSH, Hgb A1c and lipid panel) in April.   Stephanie Acre, MD 03/30/2018, 2:56 PM

## 2018-04-04 DIAGNOSIS — H1820 Unspecified corneal edema: Secondary | ICD-10-CM | POA: Diagnosis not present

## 2018-04-06 DIAGNOSIS — H1820 Unspecified corneal edema: Secondary | ICD-10-CM | POA: Diagnosis not present

## 2018-05-17 ENCOUNTER — Other Ambulatory Visit (HOSPITAL_COMMUNITY): Payer: Self-pay

## 2018-05-17 ENCOUNTER — Other Ambulatory Visit (HOSPITAL_COMMUNITY): Payer: Self-pay | Admitting: Psychiatry

## 2018-05-17 DIAGNOSIS — F3171 Bipolar disorder, in partial remission, most recent episode hypomanic: Secondary | ICD-10-CM

## 2018-05-17 MED ORDER — DOXEPIN HCL 50 MG PO CAPS: 50.0000 mg | ORAL_CAPSULE | Freq: Every day | ORAL | 0 refills | Status: DC

## 2018-05-19 ENCOUNTER — Other Ambulatory Visit: Payer: Self-pay

## 2018-05-19 ENCOUNTER — Encounter: Payer: Self-pay | Admitting: Family Medicine

## 2018-05-19 ENCOUNTER — Ambulatory Visit (INDEPENDENT_AMBULATORY_CARE_PROVIDER_SITE_OTHER): Payer: BLUE CROSS/BLUE SHIELD | Admitting: Family Medicine

## 2018-05-19 DIAGNOSIS — F1021 Alcohol dependence, in remission: Secondary | ICD-10-CM | POA: Diagnosis not present

## 2018-05-19 DIAGNOSIS — E7889 Other lipoprotein metabolism disorders: Secondary | ICD-10-CM

## 2018-05-19 DIAGNOSIS — I10 Essential (primary) hypertension: Secondary | ICD-10-CM | POA: Diagnosis not present

## 2018-05-19 DIAGNOSIS — F319 Bipolar disorder, unspecified: Secondary | ICD-10-CM

## 2018-05-19 MED ORDER — PRAVASTATIN SODIUM 10 MG PO TABS: 10.0000 mg | ORAL_TABLET | Freq: Every day | ORAL | 0 refills | Status: DC

## 2018-05-19 MED ORDER — LOSARTAN POTASSIUM 100 MG PO TABS: 100.0000 mg | ORAL_TABLET | Freq: Every day | ORAL | 0 refills | Status: DC

## 2018-05-19 NOTE — Progress Notes (Signed)
Virtual Visit via Video Note  I connected with Thomas Williamson  on 05/19/18 at  2:45 PM EDT by a video enabled telemedicine application and verified that I am speaking with the correct person using two identifiers.  Location patient: home Location provider:work or home office Persons participating in the virtual visit: patient, provider  I discussed the limitations of evaluation and management by telemedicine and the availability of in person appointments. The patient expressed understanding and agreed to proceed.   HPI:  Thomas Williamson is a pleasant 43 y.o. here for follow up. Chronic medical problems summarized below were reviewed for changes and stability and were updated as needed below. These issues and their treatment remain stable for the most part.  Reports is doing well - see below for updates. Denies CP, SOB, DOE, treatment intolerance or new symptoms.  HTN: -med: losartan -hctz no longer used as has interaction with lithium  HLD: -meds: statin -not enough exercise; diet ok  GERD: -continues on PPI, doing well  Bipolar d/o/Hx alcohol abuse: -sees psychiatry for management -on doxepin, lexapro, lithium, zyprexa -has psychiatrist, does not have a counselor - wants a counselor -reports alcohol use much better, but still some use - especially if home and isolated and that has been an issue with the [place]. Reports seeing psychiatry frequently about this.  ROS: See pertinent positives and negatives per HPI.  Past Medical History:  Diagnosis Date  . Allergy   . Anxiety   . Bipolar disorder ([place])   . Depression   . GERD (gastroesophageal reflux disease)   . Hx of hepatitis C 10/05/2017   -treated in 2019 -hepatology recommended no further surveillance needed except for LFTs with labs and see hepatologist if elevated  . Hypertension   . IBS (irritable bowel syndrome)   . PONV (postoperative nausea and vomiting)     Past Surgical History:  Procedure Laterality Date  .  ANTERIOR CRUCIATE LIGAMENT REPAIR  2010  . INGUINAL HERNIA REPAIR  02/23/2012   Procedure: LAPAROSCOPIC BILATERAL INGUINAL HERNIA REPAIR;  Surgeon: Shann Medal, MD;  Location: [facility];  Service: General;  Laterality: Bilateral;  Laparoscopic Bilateral Inguinal Hernia Repair with mesh  . INSERTION OF MESH  02/23/2012   Procedure: INSERTION OF MESH;  Surgeon: Shann Medal, MD;  [facility];  Service: General;  Laterality: N/A;  . NOSE SURGERY  2000,2012    Family History  Problem Relation Age of Onset  . Hypertension Mother   . Hypertension Father     SOCIAL HX: see hpi   Current Outpatient Medications:  .  doxepin (SINEQUAN) 50 MG capsule, Take 1 capsule (50 mg total) by mouth at bedtime., Disp: 90 capsule, Rfl: 0 .  escitalopram (LEXAPRO) 20 MG tablet, Take 1 tablet (20 mg total) by mouth daily., Disp: 30 tablet, Rfl: 1 .  lithium carbonate (ESKALITH) 450 MG CR tablet, Take 1 tablet (450 mg total) by mouth at bedtime., Disp: 90 tablet, Rfl: 0 .  losartan-hydrochlorothiazide (HYZAAR) 100-25 MG tablet, Take 1 tablet by mouth daily., Disp: , Rfl:  .  OLANZapine (ZYPREXA) 5 MG tablet, Take 1 tablet (5 mg total) by mouth at bedtime., Disp: 90 tablet, Rfl: 0 .  omeprazole (PRILOSEC) 20 MG capsule, Take 20 mg by mouth daily., Disp: , Rfl:  .  pravastatin (PRAVACHOL) 10 MG tablet, TAKE 1 TABLET (10 MG TOTAL) BY MOUTH DAILY., Disp: 90 tablet, Rfl: 1 .  valACYclovir (VALTREX) 1000 MG tablet, TAKE 1 TABLET (1,000 MG TOTAL) BY  MOUTH 2 (TWO) TIMES DAILY., Disp: 20 tablet, Rfl: 0  EXAM:  VITALS per patient if applicable: he agrees to find cuff an follow up with home values  GENERAL: alert, oriented, appears well and in no acute distress  HEENT: atraumatic, conjunttiva clear, no obvious abnormalities on inspection of external nose and ears  NECK: normal movements of the head and neck  LUNGS: on inspection no signs of respiratory distress, breathing rate appears normal, no obvious gross  SOB, gasping or wheezing  CV: no obvious cyanosis  MS: moves all visible extremities without noticeable abnormality  PSYCH/NEURO: pleasant and cooperative, no obvious depression or anxiety, speech and thought processing grossly intact  ASSESSMENT AND PLAN:  Discussed the following assessment and plan:  Essential hypertension  Lipids abnormal  Bipolar depression (HCC)  Alcohol use disorder, severe, in early remission ([place])  He plans to monitor BP at home and follow up in a few weeks to review this log. Seeing psychiatry for mood disorder and hx of alcohol abuse. Also advised CBT and he is interested. Will have assistant provide him with the info for [facility]. Advised healthy diet and regular exercise and discussed options during stay at home orders forCOVID 19 pandemic. Continue statin.   I discussed the assessment and treatment plan with the patient. The patient was provided an opportunity to ask questions and all were answered. The patient agreed with the plan and demonstrated an understanding of the instructions.   The patient was advised to call back or seek an in-person evaluation if the symptoms worsen or if the condition fails to improve as anticipated.   Follow up instructions: Advised assistant Wendie Simmer to help patient arrange the following: -TOC with Dr. Ethlyn Gallery in 2-3 weeks/BP follow up -# to call to schedule visit with Dennison Bulla, [place], DO

## 2018-05-30 ENCOUNTER — Other Ambulatory Visit: Payer: Self-pay

## 2018-05-30 ENCOUNTER — Ambulatory Visit (INDEPENDENT_AMBULATORY_CARE_PROVIDER_SITE_OTHER): Payer: BLUE CROSS/BLUE SHIELD | Admitting: Psychiatry

## 2018-05-30 DIAGNOSIS — F3175 Bipolar disorder, in partial remission, most recent episode depressed: Secondary | ICD-10-CM | POA: Diagnosis not present

## 2018-05-30 DIAGNOSIS — F3171 Bipolar disorder, in partial remission, most recent episode hypomanic: Secondary | ICD-10-CM | POA: Diagnosis not present

## 2018-05-30 DIAGNOSIS — F1021 Alcohol dependence, in remission: Secondary | ICD-10-CM

## 2018-05-30 MED ORDER — BUPROPION HCL ER (XL) 150 MG PO TB24: 150.0000 mg | ORAL_TABLET | Freq: Every day | ORAL | 2 refills | Status: DC

## 2018-05-30 MED ORDER — OLANZAPINE 5 MG PO TABS: 5.0000 mg | ORAL_TABLET | Freq: Every day | ORAL | 0 refills | Status: DC

## 2018-05-30 MED ORDER — ESCITALOPRAM OXALATE 20 MG PO TABS: 20.0000 mg | ORAL_TABLET | Freq: Every day | ORAL | 1 refills | Status: DC

## 2018-05-30 MED ORDER — LITHIUM CARBONATE ER 450 MG PO TBCR: 450.0000 mg | EXTENDED_RELEASE_TABLET | Freq: Every day | ORAL | 0 refills | Status: DC

## 2018-05-30 MED ORDER — NALTREXONE HCL 50 MG PO TABS: 50.0000 mg | ORAL_TABLET | Freq: Every day | ORAL | 2 refills | Status: DC

## 2018-05-30 NOTE — Progress Notes (Signed)
[facility] MD/PA/NP OP Progress Note  05/30/2018 3:30 PM Thomas Williamson  MRN:  [id number] Interview was conducted using WebEx teleconferencing application and I verified that I was speaking with the correct person using two identifiers. I discussed the limitations of evaluation and management by telemedicine and  the availability of in person appointments. Patient expressed understanding and agreed to proceed.  Chief Complaint: Fatigue, lack of libido.  HPI: 43 yo male with BPAD and alcohol use disorder in early remission. Mood neutral, sleep and appetite good. He does not use doxepin often. Denies feeling depressed, no psychosis. Bright affect, normal (not pressured) speech. He is on escitalopram 20 mg daily for anxiety as well as olanzapione prn anxiety, doxepin for insomnia, lithium for mood stabilization. He had been on naltrexone for alcohol craving but does not take it anymore and admits that he has been drinking a few time "with dinner". Somewhat elusive about hoe much he drank but denies getting drunk. I encouraged him to restart naltrexone and refrain from drinking at all as he will likely again lose control over his addiction. He complains of having low libido and energy. We did not check lithium level (as well as creatinine, TSH, Hgb A1c and lipid panel)  As the clinic is only open for IM injections.  Visit Diagnosis:    ICD-10-CM   1. Bipolar disorder, in partial remission, most recent episode hypomanic (HCC) F31.71 lithium carbonate (ESKALITH) 450 MG CR tablet  2. Bipolar disorder, in partial remission, most recent episode depressed (HCC) F31.75 OLANZapine (ZYPREXA) 5 MG tablet  3. Alcohol use disorder, severe, in early remission ([place]) F10.21     Past Psychiatric History: Please refer to intake H&P.  Past Medical History:  Past Medical History:  Diagnosis Date  . Allergy   . Anxiety   . Bipolar disorder ([place])   . Depression   . GERD (gastroesophageal reflux disease)   . Hx of hepatitis C  10/05/2017   -treated in 2019 -hepatology recommended no further surveillance needed except for LFTs with labs and see hepatologist if elevated  . Hypertension   . IBS (irritable bowel syndrome)   . PONV (postoperative nausea and vomiting)     Past Surgical History:  Procedure Laterality Date  . ANTERIOR CRUCIATE LIGAMENT REPAIR  2010  . INGUINAL HERNIA REPAIR  02/23/2012   Procedure: LAPAROSCOPIC BILATERAL INGUINAL HERNIA REPAIR;  Surgeon: Shann Medal, MD;  Location: [facility];  Service: General;  Laterality: Bilateral;  Laparoscopic Bilateral Inguinal Hernia Repair with mesh  . INSERTION OF MESH  02/23/2012   Procedure: INSERTION OF MESH;  Surgeon: Shann Medal, MD;  Location: [facility];  Service: General;  Laterality: N/A;  . NOSE SURGERY  [id number]    Family Psychiatric History: None.  Family History:  Family History  Problem Relation Age of Onset  . Hypertension Mother   . Hypertension Father     Social History:  Social History   Socioeconomic History  . Marital status: Single    Spouse name: Not on file  . Number of children: 0  . Years of education: Not on file  . Highest education level: Bachelor's degree (e.g., BA, AB, BS)  Occupational History  . Not on file  Social Needs  . Financial resource strain: Somewhat hard  . Food insecurity:    Worry: Sometimes true    Inability: Sometimes true  . Transportation needs:    Medical: No    Non-medical: No  Tobacco Use  . Smoking status: Never  Smoker  . Smokeless tobacco: Never Used  Substance and Sexual Activity  . Alcohol use: Not Currently    Alcohol/week: 8.0 standard drinks    Types: 8 Shots of liquor per week  . Drug use: Yes    Types: Marijuana    Comment: week ago  . Sexual activity: Not Currently  Lifestyle  . Physical activity:    Days per week: 0 days    Minutes per session: 0 min  . Stress: Very much  Relationships  . Social connections:    Talks on phone: More than three times a week    Gets  together: Twice a week    Attends religious service: Never    Active member of club or organization: Yes    Attends meetings of clubs or organizations: More than 4 times per year    Relationship status: Never married  Other Topics Concern  . 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 Hydrocodone     UPSETS STOMACH    Metabolic Disorder Labs: No results found for: HGBA1C, MPG No results found for: PROLACTIN Lab Results  Component Value Date   CHOL 208 (H) 03/31/2016   TRIG 252.0 (H) 03/31/2016   HDL 45.10 03/31/2016   CHOLHDL 5 03/31/2016   VLDL 50.4 (H) 03/31/2016   Lab Results  Component Value Date   TSH 0.74 03/31/2016   TSH 0.48 09/20/2013    Therapeutic Level Labs: Lab Results  Component Value Date   LITHIUM 0.6 12/18/2016   No results found for: VALPROATE No components found for:  CBMZ  Current Medications: Current Outpatient Medications  Medication Sig Dispense Refill  . buPROPion (WELLBUTRIN XL) 150 MG 24 hr tablet Take 1 tablet (150 mg total) by mouth daily. 30 tablet 2  . doxepin (SINEQUAN) 50 MG capsule Take 1 capsule (50 mg total) by mouth at bedtime. 90 capsule 0  . escitalopram (LEXAPRO) 20 MG tablet Take 1 tablet (20 mg total) by mouth daily. 30 tablet 1  . lithium carbonate (ESKALITH) 450 MG CR tablet Take 1 tablet (450 mg total) by mouth at bedtime. 90 tablet 0  . losartan (COZAAR) 100 MG tablet Take 1 tablet (100 mg total) by mouth daily. 90 tablet 0  . naltrexone (DEPADE) 50 MG tablet Take 1 tablet (50 mg total) by mouth daily. 30 tablet 2  . OLANZapine (ZYPREXA) 5 MG tablet Take 1 tablet (5 mg total) by mouth at bedtime. 90 tablet 0  . omeprazole (PRILOSEC) 20 MG capsule Take 20 mg by mouth daily.    .  pravastatin (PRAVACHOL) 10 MG tablet Take 1 tablet (10 mg total) by mouth daily. 90 tablet 0  . valACYclovir (VALTREX) 1000 MG tablet TAKE 1 TABLET (1,000 MG TOTAL) BY MOUTH 2 (TWO) TIMES DAILY. 20 tablet 0   No current facility-administered medications for this visit.      Musculoskeletal: Strength & Muscle Tone: within normal limits Gait & Station: normal Patient leans: N/A  Psychiatric Specialty Exam: Review of Systems  Constitutional: Positive for malaise/fatigue.  All other systems reviewed and are negative.   There were no vitals taken for this visit.There is  no height or weight on file to calculate BMI.  General Appearance: Casual  Eye Contact:  Good  Speech:  Clear and Coherent  Volume:  Normal  Mood:  Euthymic  Affect:  Full Range  Thought Process:  Goal Directed  Orientation:  Full (Time, Place, and Person)  Thought Content: Logical   Suicidal Thoughts:  No  Homicidal Thoughts:  No  Memory:  Immediate;   Good Recent;   Good Remote;   Good  Judgement:  Other:  limited  Insight:  Fair  Psychomotor Activity:  Normal  Concentration:  Concentration: Fair  Recall:  Good  Fund of Knowledge: Good  Language: Good  Akathisia:  Negative  Handed:  Right  AIMS (if indicated): not done  Assets:  Communication Skills Desire for Improvement Financial Resources/Insurance Housing Physical Health Talents/Skills  ADL's:  Intact  Cognition: WNL  Sleep:  Good     Assessment and Plan: 43 yo male with BPAD and alcohol use disorder in early and partial remission. Mood neutral, sleep and appetite good. He does not use doxepin often. Denies feeling depressed, no psychosis. Bright affect, normal (not pressured) speech. He is on escitalopram 20 mg daily for anxiety as well as olanzapione prn anxiety, doxepin for insomnia, lithium for mood stabilization. He had been on naltrexone for alcohol craving but does not take it anymore and admits that he has been drinking a few time "with  dinner". Somewhat elusive about hoe much he drank but denies getting drunk. I encouraged him to restart naltrexone and refrain from drinking at all as he will likely again lose control over his addiction. He complains of having low libido and energy.   Plan: Continue all meds unchanged. Add Wellbutrin XL 150  g for energy (perhaps can positively affect libido as well). I will restart naltrexone for alcohol craving - unclear if he weel heed my advise and take it while refraining from alcohol drinking. We still plan to check lithium level (as well as creatinine, TSH, Hgb A1c and lipid panel)  Once the clinic fully opens. Return to clinic in two months.    Stephanie Acre, MD 05/30/2018, 3:30 PM

## 2018-06-09 ENCOUNTER — Telehealth: Payer: Self-pay | Admitting: *Deleted

## 2018-06-09 NOTE — Telephone Encounter (Signed)
Per office notes from 4/16, I left a detailed message at the pts cell number to call and schedule a follow up phone visit.  Also left the number for [phone].

## 2018-06-23 ENCOUNTER — Other Ambulatory Visit (HOSPITAL_COMMUNITY): Payer: Self-pay | Admitting: Psychiatry

## 2018-06-29 ENCOUNTER — Telehealth: Payer: Self-pay | Admitting: *Deleted

## 2018-06-29 NOTE — Telephone Encounter (Signed)
Copied from [facility] [id number]. Topic: General - Inquiry >> Jun 29, 2018  2:08 PM Richardo Priest, [place] wrote: Reason for CRM: Patient's therapist is calling in stating he would like to discuss/inquire about a substance IOP. Call back is [phone].

## 2018-06-30 NOTE — Telephone Encounter (Signed)
Please let them know that we do not do substance rehab, treatment or management. Would recommend that he be under the care of a psychiatrist or substance specialist as have advised in the past. Thank you. Happy to talk with therapist further if needed.

## 2018-06-30 NOTE — Telephone Encounter (Signed)
I called Thomas Williamson and informed him of the message below.

## 2018-07-11 ENCOUNTER — Ambulatory Visit (HOSPITAL_COMMUNITY): Payer: BLUE CROSS/BLUE SHIELD | Admitting: Psychology

## 2018-07-11 ENCOUNTER — Other Ambulatory Visit: Payer: Self-pay

## 2018-07-11 ENCOUNTER — Encounter (HOSPITAL_COMMUNITY): Payer: Self-pay | Admitting: Psychology

## 2018-07-11 DIAGNOSIS — F1020 Alcohol dependence, uncomplicated: Secondary | ICD-10-CM

## 2018-07-11 NOTE — Progress Notes (Signed)
Comprehensive Clinical Assessment (CCA) Note  07/11/2018 Thomas Williamson [id number]  Visit Diagnosis:   Alcohol Use Disorder, Severe    CCA Part One  Part One has been completed on paper by the patient.  (See scanned document in Chart Review)  CCA Part Two A  Intake/Chief Complaint:  CCA Intake With Chief Complaint CCA Part Two Date: 07/11/18 CCA Part Two Time: 1057 Chief Complaint/Presenting Problem: I was drinking a fifth of whiskey a day, was all alone and just getting  more and more depressed. I spoke with the team at [facility] and was on a plane the next day for [name]. Everything was just going to hell. Patients Currently Reported Symptoms/Problems: When I drink I get depressed and I get mean. This mood and behavioral change has been going on for years. In college, my friends would say I had an alter-ego and when I drank, the "Ninja" would come out. I was always getting into fights.  Collateral Involvement: Patient signed consent to speak with his parents. He is staying with them temporarily for more accountability and safety. Individual's Strengths: Motivated for treatment, areticulate, familiar with recovery concepts, family suppport, transportation Individual's Preferences: He wants a program held in the daytime. To develop more accountability and to stop isolating. Individual's Abilities: Owns his own home, has consistent employment record, good social skills Type of Services Patient Feels Are Needed: Something intensive for sure and accountability with drug tests Initial Clinical Notes/Concerns: Patient has long history of drug use. He lives an isolated life with little accountability. Has few friends. The patient seems to think he has an alcohol problem, but doesn't seem to understand that smoking cannabis introduces his brain to a substance and wiuthin a short time he is back to drinking. Total Abstinence.  Mental Health Symptoms Depression:  Depression: Change in energy/activity,  Increase/decrease in appetite, Worthlessness, Fatigue, Hopelessness, Difficulty Concentrating  Mania:  Mania: N/A  Anxiety:   Anxiety: Worrying, Tension, Restlessness, Irritability  Psychosis:  Psychosis: N/A  Trauma:  Trauma: N/A  Obsessions:  Obsessions: N/A  Compulsions:  Compulsions: N/A  Inattention:  Inattention: N/A  Hyperactivity/Impulsivity:  Hyperactivity/Impulsivity: N/A  Oppositional/Defiant Behaviors:  Oppositional/Defiant Behaviors: N/A  Borderline Personality:  Emotional Irregularity: N/A  Other Mood/Personality Symptoms:  Other Mood/Personality Symtpoms: Patient reports he is diagnosed with Bipolar, but experiences this primarily in depression.    Mental Status Exam Appearance and self-care  Stature:  Stature: Average  Weight:  Weight: Overweight  Clothing:  Clothing: Casual  Grooming:  Grooming: Normal  Cosmetic use:  Cosmetic Use: None  Posture/gait:  Posture/Gait: Normal  Motor activity:  Motor Activity: Not Remarkable  Sensorium  Attention:  Attention: Normal  Concentration:  Concentration: Normal  Orientation:  Orientation: X5  Recall/memory:  Recall/Memory: Normal  Affect and Mood  Affect:  Affect: Appropriate  Mood:     Relating  Eye contact:  Eye Contact: Normal  Facial expression:  Facial Expression: Responsive  Attitude toward examiner:  Attitude Toward Examiner: Cooperative  Thought and Language  Speech flow: Speech Flow: Normal  Thought content:  Thought Content: Appropriate to mood and circumstances  Preoccupation:     Hallucinations:     Organization:     Transport planner of Knowledge:  Fund of Knowledge: Average  Intelligence:  Intelligence: Average  Abstraction:  Abstraction: Normal  Judgement:  Judgement: Fair  Art therapist:  Reality Testing: Realistic  Insight:  Insight: Fair  Decision Making:  Decision Making: Impulsive  Social Functioning  Social Maturity:  Social  Maturity: Impulsive, Isolates  Social Judgement:  Social  Judgement: "Games developer", Heedless  Stress  Stressors:  Stressors: Chiropodist, Work  Coping Ability:  Coping Ability: Research officer, political party Deficits:     Supports:      Family and Psychosocial History: Family history Marital status: Single Are you sexually active?: Yes What is your sexual orientation?: heterosexual Has your sexual activity been affected by drugs, alcohol, medication, or emotional stress?: no Does patient have children?: No  Childhood History:  Childhood History By whom was/is the patient raised?: Both parents Additional childhood history information: Patient describes his parents as 'always really good to me". His father was a Engineer, structural in [place] and very strict, but never overly punitive or excessive Description of patient's relationship with caregiver when they were a child: Good. They came to all my ball games and were alwqays very supportive of me. Patient's description of current relationship with people who raised him/her: "Pretty good". I am staying with them in [place] right now. We all agree I don't need to be alone right now and since I am not working, it will be fine.  How were you disciplined when you got in trouble as a child/adolescent?: appropriately. never excessive or abusive. Does patient have siblings?: Yes Number of Siblings: 1 Description of patient's current relationship with siblings: It is 'distant'. We love each other, but we rarely talk". Right now my brother, two years older, is going through a nasty divorce. He has been at my parents' home, but will be moving out and into a house he is getting ready to close on. He is a big beer drinker, but he is a 'functioning alcoholic".  Did patient suffer any verbal/emotional/physical/sexual abuse as a child?: No Did patient suffer from severe childhood neglect?: No Has patient ever been sexually abused/assaulted/raped as an adolescent or adult?: No Was the patient ever a victim of a crime or a disaster?:  No Witnessed domestic violence?: No Has patient been effected by domestic violence as an adult?: No  CCA Part Two B  Employment/Work Situation: Employment / Work Situation Employment situation: Employed Where is patient currently employed?: Patient works at [employer], but has been out-of-work since the [place] virus. 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  How long has patient been employed?: One year Patient's job has been impacted by current illness: Yes Describe how patient's job has been impacted: He has sometimes missed work or been late, but they have no idea of his problem and he is not currently in trouble with employer What is the longest time patient has a held a job?: 7 years Where was the patient employed at that time?: [employer] in [place] Did You Receive Any Psychiatric Treatment/Services While in the [employer]?: No  Education: Museum/gallery curator Currently Attending: Not currently Last Grade Completed: 16 [employer] in [place], [place] Did Teacher, adult education From [employer]?: Yes Did Physicist, medical?: Yes What Type of College Degree Do you Have?: BS in [place] Did [place]?: No What Was Your Major?: Public Relations Did You Have Any Special Interests In School?: I was partying a lot at that point Did You Have An Individualized Education Program (IIEP): No Did You Have Any Difficulty At School?: No  Religion: Religion/Spirituality Are You A Religious Person?: Yes What is Your Religious Affiliation?: Catholic How Might This Affect Treatment?: It should help me with my "Higher Power".   Leisure/Recreation: Leisure / Recreation Leisure and Hobbies: golf, but  I haven't played in quite a while  Exercise/Diet: Exercise/Diet Do You Exercise?: No Have You Gained or Lost A Significant Amount of Weight in the Past Six Months?: No Do You Follow a Special Diet?: No Do You Have Any Trouble Sleeping?: No  CCA Part Two  C  Alcohol/Drug Use: Alcohol / Drug Use Pain Medications: N/A Prescriptions: Lithium, Lexapro, Propanolol, Losartan Over the Counter: N/C History of alcohol / drug use?: Yes Longest period of sobriety (when/how long): 4 months Negative Consequences of Use: Financial, Legal, Personal relationships, Work / School Withdrawal Symptoms: Blackouts, Nausea / Vomiting, Sweats Substance #1 Name of Substance 1: Alcohol 1 - Age of First Use: 15 1 - Amount (size/oz): a fifth of whiskey 1 - Frequency: Daily 1 - Duration: Off and on for years 1 - Last Use / Amount: Jun 06, 2018 - I drank a fifth Substance #2 Name of Substance 2: Marijuana 2 - Age of First Use: 15 2 - Amount (size/oz): couple of bowls 2 - Frequency: daily 2 - Duration: for years 2 - Last Use / Amount: Jun 06, 2018 - a couple of bowls Substance #3 Name of Substance 3: Opiates 3 - Age of First Use: 32 - I had injured my knee (I was drunk and tore my ACL and MCL). I sought them out after the pain pill prescription ended and bought them on the street. 3 - Amount (size/oz): a couple of percocets 3 - Frequency: 3-4 times per week 3 - Duration: off and on for a few monthas 3 - Last Use / Amount: 2010 - one of two pills Substance #4 Name of Substance 4: Cocaine 4 - Age of First Use: 20 4 - Amount (size/oz): one-half gram 4 - Frequency: on weekends 4 - Duration: off and on for years 4 - Last Use / Amount: 2012  -  a couple of lines              CCA Part Three  ASAM's:  Six Dimensions of Multidimensional Assessment  Dimension 1:  Acute Intoxication and/or Withdrawal Potential:  Dimension 1:  Comments: Patient has been drug-free for almost a month. He last used on Jun 06, 2018. No current risk of withdrawal  Dimension 2:  Biomedical Conditions and Complications:  Dimension 2:  Comments: He is out of shape, but does not suffer from any medical conditions or physical discomfort.  Dimension 3:  Emotional, Behavioral, or Cognitive  Conditions and Complications:  Dimension 3:  Comments: Patient has been diagnosed with Bipolar Disorder and met with Sheralyn Boatman, MD for a number of years. After she left the practice, he met with Modena Morrow, MD here at [facility]. Later, he was transferred to Dr. Montel Culver. How=ever, he admits that he has never stayed on them for a signifivant period of time  Dimension 4:  Readiness to Change:  Dimension 4:  Comments: Patient has gone to the very same Treatment Center three times in the past three years. He doesn't seem to keep his focus on stay on track.   Dimension 5:  Relapse, Continued use, or Continued Problem Potential:  Dimension 5:  Comments: Patient lives alone in [place], but he is staying with his parents in [place] for the time being. He admits it will be difficult to stay sober at his home. Patient has tendency to isolate and has few friends.   Dimension 6:  Recovery/Living Environment:  Dimension 6:  Recovery/Living Environment Comments: Patient states he will remain sober while  he is living with parents, but that won't continue indefinitely. Will have to build in some accountability or he will be back to drinking in no time.    Substance use Disorder (SUD) Substance Use Disorder (SUD)  Checklist Symptoms of Substance Use: Social, occupational, recreational activities given up or reduced due to use, Substance(s) often taken in large amounts or over longer times than was intended, Recurrent use that results in a fialure to fulfill major rule obligatinos (work, school, home), Persistent desire or unsuccessful efforts to cut down or control use, Presence of craving or strong urge to use, Large amounts of time spent to obtain, use or recover from the substance(s), Evidence of tolerance, Continued use despite persistent or recurrent social, interpersonal problems, caused or exacerbated by use, Continued use despite having a persistent/recurrent physical/psychological problem  caused/exacerbated by use, Evidence of withdrawal (Comment)  Social Function:  Social Functioning Social Maturity: Impulsive, Isolates Social Judgement: "Games developer", Heedless  Stress:  Stress Stressors: Chiropodist, Work Coping Ability: Exhausted Patient Takes Medications The Way The Doctor Instructed?: No Priority Risk: Low Acuity  Risk Assessment- Self-Harm Potential: Risk Assessment For Self-Harm Potential Thoughts of Self-Harm: No current thoughts Method: No plan Availability of Means: No access/NA Additional Comments for Self-Harm Potential: No history or thoughts of self-harm. No history of atttempts  Risk Assessment -Dangerous to Others Potential: Risk Assessment For Dangerous to Others Potential Method: No Plan Availability of Means: No access or NA Intent: Vague intent or NA Notification Required: No need or identified person Additional Comments for Danger to Others Potential: Patietn reported he can get angry when intoxicated, but he has no plans or person he intends to hurt now or in the future.  DSM5 Diagnoses: Patient Active Problem List   Diagnosis Date Noted  . Bipolar disorder, in partial remission, most recent episode hypomanic ([place]) 03/30/2018  . Alcohol use disorder, severe, in early remission ([place]) 03/30/2018  . Alcohol use disorder, moderate, dependence ([place]) 02/26/2018  . Hx of hepatitis C 10/05/2017  . Cervical disc disease 11/20/2014  . Lipids abnormal 09/29/2013  . Hypertension 02/24/2011  . Bipolar depression ([place]) 02/24/2011  . Hernia, inguinal, bilateral 02/24/2011    Patient Centered Plan: Patient is on the following Treatment Plan(s):  Patient will return on Wednesday, June 10 to complete an orientation and begin the CD-IOP.   Recommendations for Services/Supports/Treatments: Recommendations for Services/Supports/Treatments Recommendations For Services/Supports/Treatments: CD-IOP Intensive Chemical Dependency Program  Treatment Plan  Summary:    Referrals to Alternative Service(s): Referred to Alternative Service(s):   Place:   Date:   Time:    Referred to Alternative Service(s):   Place:   Date:   Time:    Referred to Alternative Service(s):   Place:   Date:   Time:    Referred to Alternative Service(s):   Place:   Date:   Time:     Brandon Melnick

## 2018-07-12 ENCOUNTER — Ambulatory Visit (INDEPENDENT_AMBULATORY_CARE_PROVIDER_SITE_OTHER): Payer: BC Managed Care – PPO | Admitting: Family Medicine

## 2018-07-12 ENCOUNTER — Other Ambulatory Visit: Payer: Self-pay

## 2018-07-12 ENCOUNTER — Telehealth: Payer: Self-pay | Admitting: *Deleted

## 2018-07-12 DIAGNOSIS — I10 Essential (primary) hypertension: Secondary | ICD-10-CM

## 2018-07-12 DIAGNOSIS — R000 Tachycardia, unspecified: Secondary | ICD-10-CM

## 2018-07-12 DIAGNOSIS — E7889 Other lipoprotein metabolism disorders: Secondary | ICD-10-CM | POA: Diagnosis not present

## 2018-07-12 DIAGNOSIS — F1021 Alcohol dependence, in remission: Secondary | ICD-10-CM

## 2018-07-12 DIAGNOSIS — F319 Bipolar disorder, unspecified: Secondary | ICD-10-CM

## 2018-07-12 MED ORDER — AMLODIPINE BESYLATE 10 MG PO TABS: 10.0000 mg | ORAL_TABLET | Freq: Every day | ORAL | 1 refills | Status: DC

## 2018-07-12 MED ORDER — PROPRANOLOL HCL 20 MG PO TABS: 20.0000 mg | ORAL_TABLET | Freq: Two times a day (BID) | ORAL | 1 refills | Status: DC

## 2018-07-12 NOTE — Telephone Encounter (Signed)
I left a detailed message at the pts cell number to call back for an appt as below.

## 2018-07-12 NOTE — Progress Notes (Signed)
Virtual Visit via Video Note  I connected with Thomas Williamson  on 07/12/18 at 10:00 AM EDT by a video enabled telemedicine application and verified that I am speaking with the correct person using two identifiers. Started with video, but had technical issues so transitioned to phone.  Location patient: home Location provider:work or home office Persons participating in the virtual visit: patient, provider  I discussed the limitations of evaluation and management by telemedicine and the availability of in person appointments. The patient expressed understanding and agreed to proceed.   HPI:  Thomas Williamson is a pleasant 43 yo with a PMH significant for HTN, HLD, Anxiety, Depression and alcohol abuse here for follow up. Reports was hospitalized for 1 month in [place] recently for alcohol use. Now is back home and plugged in with behavioral health for IOP and reports is doing great. Is living at home which he feels has been really helpful.  He reports while in the hospital he had issues with his blood pressure and an elevated heart rate. Reports they added lisinopril, norvasc 5 mg twice daily and propranolol 20 mg twice daily to his medications for these issues. Checked his BP and had just taken his meds and only 5 mg novasc. BP is 130/70 range. HR today is 92. No symptoms. Feels great. Reports labs were done a few weeks ago and included kidney and liver tests which were good.   ROS: See pertinent positives and negatives per HPI.  Past Medical History:  Diagnosis Date  . Allergy   . Anxiety   . Bipolar disorder ([place])   . Depression   . GERD (gastroesophageal reflux disease)   . Hx of hepatitis C 10/05/2017   -treated in 2019 -hepatology recommended no further surveillance needed except for LFTs with labs and see hepatologist if elevated  . Hypertension   . IBS (irritable bowel syndrome)   . PONV (postoperative nausea and vomiting)     Past Surgical History:  Procedure Laterality Date  . ANTERIOR  CRUCIATE LIGAMENT REPAIR  2010  . INGUINAL HERNIA REPAIR  02/23/2012   Procedure: LAPAROSCOPIC BILATERAL INGUINAL HERNIA REPAIR;  Surgeon: Shann Medal, MD;  Location: [facility];  Service: General;  Laterality: Bilateral;  Laparoscopic Bilateral Inguinal Hernia Repair with mesh  . INSERTION OF MESH  02/23/2012   Procedure: INSERTION OF MESH;  Surgeon: Shann Medal, MD;  Location: [facility];  Service: General;  Laterality: N/A;  . NOSE SURGERY  2000,2012    Family History  Problem Relation Age of Onset  . Hypertension Mother   . Hypertension Father     SOCIAL HX: see hpi   Current Outpatient Medications:  .  amLODipine (NORVASC) 10 MG tablet, Take 1 tablet (10 mg total) by mouth daily., Disp: 90 tablet, Rfl: 1 .  doxepin (SINEQUAN) 50 MG capsule, Take 1 capsule (50 mg total) by mouth at bedtime., Disp: 90 capsule, Rfl: 0 .  escitalopram (LEXAPRO) 20 MG tablet, Take 1 tablet (20 mg total) by mouth daily., Disp: 30 tablet, Rfl: 1 .  lithium carbonate (ESKALITH) 450 MG CR tablet, Take 1 tablet (450 mg total) by mouth at bedtime., Disp: 90 tablet, Rfl: 0 .  losartan (COZAAR) 100 MG tablet, Take 1 tablet (100 mg total) by mouth daily., Disp: 90 tablet, Rfl: 0 .  naltrexone (DEPADE) 50 MG tablet, Take 1 tablet (50 mg total) by mouth daily., Disp: 30 tablet, Rfl: 2 .  OLANZapine (ZYPREXA) 5 MG tablet, Take 1 tablet (5 mg total) by  mouth at bedtime., Disp: 90 tablet, Rfl: 0 .  omeprazole (PRILOSEC) 20 MG capsule, Take 20 mg by mouth daily., Disp: , Rfl:  .  pravastatin (PRAVACHOL) 10 MG tablet, Take 1 tablet (10 mg total) by mouth daily., Disp: 90 tablet, Rfl: 0 .  propranolol (INDERAL) 20 MG tablet, Take 1 tablet (20 mg total) by mouth 2 (two) times daily., Disp: 180 tablet, Rfl: 1 .  valACYclovir (VALTREX) 1000 MG tablet, TAKE 1 TABLET (1,000 MG TOTAL) BY MOUTH 2 (TWO) TIMES DAILY., Disp: 20 tablet, Rfl: 0  EXAM:  VITALS per patient if applicable: see HPI  GENERAL: alert, oriented, appears  well and in no acute distress  HEENT: atraumatic, conjunttiva clear, no obvious abnormalities on inspection of external nose and ears  NECK: normal movements of the head and neck  LUNGS: on inspection no signs of respiratory distress, breathing rate appears normal, no obvious gross SOB, gasping or wheezing  CV: no obvious cyanosis  MS: moves all visible extremities without noticeable abnormality  PSYCH/NEURO: pleasant and cooperative, no obvious depression or anxiety, speech and thought processing grossly intact  ASSESSMENT AND PLAN:  Discussed the following assessment and plan:  Essential hypertension  Lipids abnormal  Tachycardia  Alcohol use disorder, severe, in early remission ([place])  Bipolar depression ([place])  Reviewed and updated his medications. He is taking lithium 450 in the morning and 350 at night. Opted to home on Ace arb combo for now and instead take the 10 mg of norvasc in the morning. He also opted to see cardiology for and evaluation about the tachy and the bump in the blood pressure. Denies any alcohol use and is plugged in with behavioral health for IOP. Will email Dennison Bulla as he reports has been trying to get in touch with him for ongoing cbt but call has not been returned. Follow up in 1 month to recheck BP and labs -he is due for a lipid check.   I discussed the assessment and treatment plan with the patient. The patient was provided an opportunity to ask questions and all were answered. The patient agreed with the plan and demonstrated an understanding of the instructions.   The patient was advised to call back or seek an in-person evaluation if the symptoms worsen or if the condition fails to improve as anticipated.   Follow up instructions: Advised assistant Wendie Simmer to help patient arrange the following: -follow up with Dr. Maudie Mercury in 4-6 weeks  Lucretia Kern, DO

## 2018-07-12 NOTE — Patient Instructions (Signed)
I sent a referral for the cardiology evaluation. Please call if you do not here from their office about the referral in the next 1-2 weeks.  Take 10mg  of Norvasc in the morning, the losartan and the propranolol for the blood pressure. Monitor the blood pressure on you home cuff and let me know if it is running higher then 140/80.  Continue care with psychiatry and I will send an email to Dennison Bulla regarding the issues you had getting in touch with him.  Follow up in about 4 weeks. We will need to set up some labs then.

## 2018-07-12 NOTE — Telephone Encounter (Signed)
-----   Message from Lucretia Kern, DO sent at 07/12/2018 10:57 AM EDT ----- -follow up with Dr. Maudie Mercury in 4-6 weeks

## 2018-07-13 ENCOUNTER — Encounter: Payer: Self-pay | Admitting: Licensed Clinical Social Worker

## 2018-07-13 ENCOUNTER — Telehealth (HOSPITAL_COMMUNITY): Payer: Self-pay

## 2018-07-13 ENCOUNTER — Other Ambulatory Visit (HOSPITAL_COMMUNITY): Payer: BC Managed Care – PPO | Attending: Psychiatry | Admitting: Licensed Clinical Social Worker

## 2018-07-13 ENCOUNTER — Other Ambulatory Visit: Payer: Self-pay

## 2018-07-13 VITALS — BP 113/76 | HR 76 | Temp 97.6°F | Ht 70.0 in | Wt 192.0 lb

## 2018-07-13 DIAGNOSIS — F419 Anxiety disorder, unspecified: Secondary | ICD-10-CM

## 2018-07-13 DIAGNOSIS — Z8619 Personal history of other infectious and parasitic diseases: Secondary | ICD-10-CM | POA: Diagnosis not present

## 2018-07-13 DIAGNOSIS — Z885 Allergy status to narcotic agent status: Secondary | ICD-10-CM | POA: Diagnosis not present

## 2018-07-13 DIAGNOSIS — E785 Hyperlipidemia, unspecified: Secondary | ICD-10-CM | POA: Insufficient documentation

## 2018-07-13 DIAGNOSIS — I10 Essential (primary) hypertension: Secondary | ICD-10-CM | POA: Diagnosis not present

## 2018-07-13 DIAGNOSIS — K219 Gastro-esophageal reflux disease without esophagitis: Secondary | ICD-10-CM | POA: Diagnosis not present

## 2018-07-13 DIAGNOSIS — Z79899 Other long term (current) drug therapy: Secondary | ICD-10-CM | POA: Diagnosis not present

## 2018-07-13 DIAGNOSIS — F1021 Alcohol dependence, in remission: Secondary | ICD-10-CM | POA: Insufficient documentation

## 2018-07-13 DIAGNOSIS — F1220 Cannabis dependence, uncomplicated: Secondary | ICD-10-CM

## 2018-07-13 DIAGNOSIS — Z811 Family history of alcohol abuse and dependence: Secondary | ICD-10-CM

## 2018-07-13 DIAGNOSIS — F3171 Bipolar disorder, in partial remission, most recent episode hypomanic: Secondary | ICD-10-CM | POA: Diagnosis not present

## 2018-07-13 HISTORY — DX: Cannabis dependence, uncomplicated: F12.20

## 2018-07-13 HISTORY — DX: Anxiety disorder, unspecified: F41.9

## 2018-07-13 MED ORDER — BACLOFEN 10 MG PO TABS: 10.0000 mg | ORAL_TABLET | Freq: Three times a day (TID) | ORAL | 1 refills | Status: DC

## 2018-07-13 NOTE — Telephone Encounter (Signed)
Patient would like to know if he can stop the Lexapro and continue with the Wellbutrin - patient is having issues with his libido. Please review and advise, thank you .
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
## 2018-07-13 NOTE — Telephone Encounter (Signed)
I would recommend first trying to lower dose to 10 mg but if after say 10-14 days libido does not improve he should come off it and watch for re-emergence of depression.

## 2018-07-13 NOTE — Progress Notes (Signed)
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 describes his parents as 'always really good to me". His father was a Engineer, structural in [place] and very strict, but never overly punitive or excessive Description of patient's relationship with caregiver when they were a child: Good. They came to all my ball games and were alwqays very supportive of me. Patient's description of current relationship with people who raised him/her: "Pretty good". I am staying with them in [place] right now. We all agree I don't need to be alone right now and since I am not working, it will be fine.  How were you disciplined when you got in trouble as a child/adolescent?: appropriately. never excessive or abusive. Does patient have siblings?: Yes Number of Siblings: 1 Description of patient's current relationship with siblings: It is 'distant'. We love each other, but we rarely talk". Right now my brother, two years older, is going through a nasty divorce. He has been at my parents' home, but will be moving out and into a house he is getting ready to close on. He is a big beer drinker, but he is a 'functioning alcoholic".  Did patient suffer any  verbal/emotional/physical/sexual abuse as a child?: No Did patient suffer from severe childhood neglect?: No Has patient ever been sexually abused/assaulted/raped as an adolescent or adult?: No Was the patient ever a victim of a crime or a disaster?: No Witnessed domestic violence?: No Has patient been effected by domestic violence as an adult?: No   Social History Narrative   Work or School: worked at [employer], used to be Designer, industrial/product, going back to school for rad [employer] Situation: lives with parents      Spiritual Beliefs:see above      Lifestyle:  Leisure/Recreation: Leisure / Recreation Leisure and Hobbies: golf, but I haven't played in quite a while  Exercise/Diet: Exercise/Diet Do You Exercise?: No Have You Gained or Lost A Significant Amount of Weight in the Past Six Months?: No Do You Follow a Special Diet?: No Do You Have Any Trouble Sleeping?: No       Hx alcohol abuse:as documented    Allergies:   Allergies  Allergen Reactions  . Hydrocodone     UPSETS STOMACH    Metabolic Disorder Labs: No results found for: HGBA1C, MPG No results found for: PROLACTIN Lab Results  Component Value Date   CHOL 208 (H) 03/31/2016   TRIG 252.0 (H) 03/31/2016   HDL 45.10 03/31/2016   CHOLHDL 5 03/31/2016   VLDL 50.4 (H) 03/31/2016   Lab Results  Component Value Date   TSH 0.74 03/31/2016    Therapeutic Level Labs: Lab Results  Component Value Date   LITHIUM 0.6 12/18/2016   No results found for: CBMZ No results found for: VALPROATE  Current Medications: Current Outpatient Medications  Medication Sig Dispense Refill  . buPROPion (WELLBUTRIN XL) 150 MG 24 hr tablet Take 150 mg by mouth daily.    Marland Kitchen  lithium carbonate (ESKALITH) 450 MG CR tablet Take 450 mg by mouth at bedtime.    Marland Kitchen amLODipine (NORVASC) 5 MG tablet     . baclofen (LIORESAL) 10 MG tablet Take 1 tablet (10 mg total) by mouth 3 (three) times daily. 90 tablet 1  .  doxepin (SINEQUAN) 50 MG capsule Take 1 capsule (50 mg total) by mouth at bedtime. 90 capsule 0  . escitalopram (LEXAPRO) 20 MG tablet Take 1 tablet (20 mg total) by mouth daily. 30 tablet 1  . lisinopril (ZESTRIL) 20 MG tablet     . lithium carbonate (LITHOBID) 300 MG CR tablet     . losartan (COZAAR) 100 MG tablet Take 1 tablet (100 mg total) by mouth daily. 90 tablet 0  . naltrexone (DEPADE) 50 MG tablet Take 1 tablet (50 mg total) by mouth daily. 30 tablet 2  . OLANZapine (ZYPREXA) 5 MG tablet Take 1 tablet (5 mg total) by mouth at bedtime. 90 tablet 0  . omeprazole (PRILOSEC) 20 MG capsule Take 20 mg by mouth daily.    Marland Kitchen omeprazole (PRILOSEC) 40 MG capsule     . pravastatin (PRAVACHOL) 10 MG tablet Take 1 tablet (10 mg total) by mouth daily. 90 tablet 0  . propranolol (INDERAL) 10 MG tablet     . valACYclovir (VALTREX) 1000 MG tablet TAKE 1 TABLET (1,000 MG TOTAL) BY MOUTH 2 (TWO) TIMES DAILY. 20 tablet 0   No current facility-administered medications for this visit.     Musculoskeletal: Strength & Muscle Tone: within normal limits Gait & Station: normal Patient leans: N/A  Psychiatric Specialty Exam: Review of Systems  Constitutional: Positive for malaise/fatigue. Negative for chills, diaphoresis, fever and weight loss.  HENT: Negative for congestion, ear discharge, ear pain, hearing loss, nosebleeds, sinus pain, sore throat and tinnitus.   Eyes: Negative for blurred vision, double vision, photophobia, pain, discharge and redness.  Respiratory: Negative for cough, hemoptysis, sputum production, shortness of breath, wheezing and stridor.   Cardiovascular: Negative for chest pain, palpitations, orthopnea, claudication, leg swelling and PND.       Hypertensive  Gastrointestinal: Positive for heartburn. Negative for abdominal pain, blood in stool, constipation, diarrhea, melena, nausea and vomiting.       Hep C treated  Genitourinary: Negative for dysuria, flank pain, frequency,  hematuria and urgency.  Musculoskeletal: Negative for back pain, falls, joint pain, myalgias and neck pain.  Skin: Negative for itching and rash.  Neurological: Negative for dizziness, tingling, tremors, sensory change, speech change, focal weakness, seizures, loss of consciousness, weakness and headaches.  Endo/Heme/Allergies: Negative for environmental allergies and polydipsia. Does not bruise/bleed easily.  Psychiatric/Behavioral: Positive for depression, memory loss (Hx of blackouts) and substance abuse. Negative for hallucinations and suicidal ideas. The patient is nervous/anxious and has insomnia (controlled with meds).     Blood pressure 113/76, pulse 76, temperature 97.6 F (36.4 C), height '5\' 10"'$  (1.778 m), weight 192 lb (87.1 kg).Body mass index is 27.55 kg/m.  General Appearance: Neat and Well Groomed  Eye Contact:  Good  Speech:  Clear and Coherent  Volume:  Normal  Mood:  Dysphoric  Affect:  Congruent  Thought Process:  Coherent, Goal Directed and Descriptions of Associations: Intact  Orientation:  Full (Time, Place, and Person)  Thought Content:  WDL  Suicidal Thoughts:  No  Homicidal Thoughts:  No  Memory:  Negative  Judgement:  Impaired  Insight:  Lacking  Psychomotor Activity:  Normal  Concentration:  Concentration: Good and Attention Span: Good  Recall:  Negative  Fund of Knowledge:WDL  Language: WDL  Akathisia:  Negative  Handed:  Right  AIMS (if indicated):  Not done  Assets:  Communication Skills Desire for Improvement Financial Resources/Insurance Housing Resilience Social Support Transportation Vocational/Educational  ADL's:  Intact  Cognition: WNL  Sleep:  with meds   Screenings: AUDIT     Counselor from 07/11/2018 in [facility]  Alcohol Use Disorder Identification Test Final Score (AUDIT)  30    GAD-7     Counselor from 07/11/2018 in [facility]  Total GAD-7 Score  9     PHQ2-9     Counselor from 07/11/2018 in [facility]  PHQ-2 Total Score  2  PHQ-9 Total Score  8      Assessment:Chronic alcoholic with hx of Bipolar DO and 3 relapses in past 2 years .     and Plan:  Treatment Plan/Recommendations:  Plan of Care: SUD-CDIOP see Counselor's individualized treatment program Bipolar-per Dr Montel Culver  Laboratory:  UDS per protocol  Psychotherapy: IOP Group;Individual;Family  Medications: See list Baclofen MAT added  Routine PRN Medications:  Negative  Consultations: None at this time  Safety Concerns:  Relapse  Other:  NA       Darlyne Russian, PA-C 6/15/20204:12 AM

## 2018-07-13 NOTE — Telephone Encounter (Signed)
I called patient back and let him know what the doctor said. I left a voicemail with my call back number

## 2018-07-14 ENCOUNTER — Other Ambulatory Visit (HOSPITAL_COMMUNITY): Payer: BC Managed Care – PPO | Admitting: Licensed Clinical Social Worker

## 2018-07-14 ENCOUNTER — Other Ambulatory Visit: Payer: Self-pay

## 2018-07-14 ENCOUNTER — Encounter (HOSPITAL_COMMUNITY): Payer: Self-pay | Admitting: Medical

## 2018-07-14 DIAGNOSIS — F1020 Alcohol dependence, uncomplicated: Secondary | ICD-10-CM

## 2018-07-14 DIAGNOSIS — F1021 Alcohol dependence, in remission: Secondary | ICD-10-CM | POA: Diagnosis not present

## 2018-07-14 DIAGNOSIS — F3171 Bipolar disorder, in partial remission, most recent episode hypomanic: Secondary | ICD-10-CM

## 2018-07-18 ENCOUNTER — Other Ambulatory Visit: Payer: Self-pay

## 2018-07-18 ENCOUNTER — Other Ambulatory Visit (HOSPITAL_COMMUNITY): Payer: BC Managed Care – PPO | Admitting: Licensed Clinical Social Worker

## 2018-07-18 DIAGNOSIS — F1020 Alcohol dependence, uncomplicated: Secondary | ICD-10-CM

## 2018-07-18 DIAGNOSIS — F1021 Alcohol dependence, in remission: Secondary | ICD-10-CM | POA: Diagnosis not present

## 2018-07-18 DIAGNOSIS — F3171 Bipolar disorder, in partial remission, most recent episode hypomanic: Secondary | ICD-10-CM

## 2018-07-18 NOTE — Progress Notes (Signed)
    Daily Group Progress Note  Program: CD-IOP   Group Time: 1pm-2:30pm  Participation Level: Active  Behavioral Response: Appropriate  Type of Therapy: Process Group  Topic: Clinician checked in with group members, reviewing sobriety date and community meetings attended since the last session. Clinician and group members utilized time to process stressors since the previous group and effect on recovery. Clinician and group members review previously discussed information on co-dependency and addiction. Clinician facilitated guided visualization skill focused on mindfulness as a skill to help maintain sobriety.    Group Time: 2:30pm-4pm  Participation Level: Active  Behavioral Response: Appropriate  Type of Therapy: Psycho-education Group  Topic: Clinician and group members continued discussion on effects of co-dependency on communication styles, accepting responsibility for others behaviors, and minimizing personal thoughts and feelings. Clinician presented psycho-education related to unspoken family rules in functional vs dysfunctional families in relation to control, communication, blame/acceptance, and reliability.   Summary: Client engaged in group discussions. Client is able to identify how rules in families effected how he worked at his employment and with friends and significant others. Client shared he believed overall healthy family growing up however was able to identify some unspoken forced behaviors such as having to maintain a family appearance which did not embarrass his father. Client was receptive when additional co-dependant behaviors were challenged. Client reports being engaged in [place] and having a sponsor.   Family Program: Family present? No   Name of family member(s): N/A  UDS collected: No Results:   AA/NA attended?: Yes  Sponsor?: Yes   Angelo Caroll A Kaila Devries, LCSW, LCAS

## 2018-07-18 NOTE — Progress Notes (Signed)
    Daily Group Progress Note  Program: CD-IOP   Group Time: 1pm-2:30pm  Participation Level: Active  Behavioral Response: Appropriate  Type of Therapy: Process Group  Topic: Clinician checked in with group members, assessing for SI/HI/psychosis and overall level of functioning, including sobriety date and number of community meetings attended since the previous group session. Clinician allowed time for group members to discuss recent events or stressors and their effect on recovery. Clinician and group members reviewed previously discussed family rules discussed the previous day. Clinician facilitated progressive muscle relaxation as mindfulness exercise.   Group Time: 2:30pm-4pm  Participation Level: Active  Behavioral Response: Appropriate  Type of Therapy: Psycho-education Group  Topic: Clinician provided information on Family Roles in families with addiction. Clinician provided psycho-educational information related to Adult Children of Alcoholics. Clinician facilitated discussion on common behaviors in work and interpersonal relationships.    Summary: Client engaged in group discussions. Client shared family dynamics from younger age and how this is different from the relationship he has with his parents now. Client is currently staying with parents post residential treatment to help maintain accountability and avoid isolating which occurred after previous treatment. Client currently has a job and finds having structure in his day is helpful as a stepdown. Client shows progress toward goals as evidence by maintaining sobriety and engaging in group services.   Family Program: Family present? No   Name of family member(s): N/A  UDS collected: No Results: N/A  AA/NA attended?: No  Sponsor?: Yes   Kriya Westra A Brittanie Dosanjh, LCSW, LCAS

## 2018-07-20 ENCOUNTER — Other Ambulatory Visit (INDEPENDENT_AMBULATORY_CARE_PROVIDER_SITE_OTHER): Payer: BC Managed Care – PPO | Admitting: Licensed Clinical Social Worker

## 2018-07-20 ENCOUNTER — Other Ambulatory Visit: Payer: Self-pay

## 2018-07-20 ENCOUNTER — Encounter (HOSPITAL_COMMUNITY): Payer: Self-pay | Admitting: Medical

## 2018-07-20 DIAGNOSIS — I10 Essential (primary) hypertension: Secondary | ICD-10-CM

## 2018-07-20 DIAGNOSIS — Z811 Family history of alcohol abuse and dependence: Secondary | ICD-10-CM

## 2018-07-20 DIAGNOSIS — F3171 Bipolar disorder, in partial remission, most recent episode hypomanic: Secondary | ICD-10-CM

## 2018-07-20 DIAGNOSIS — F1220 Cannabis dependence, uncomplicated: Secondary | ICD-10-CM

## 2018-07-20 DIAGNOSIS — F1021 Alcohol dependence, in remission: Secondary | ICD-10-CM | POA: Diagnosis not present

## 2018-07-20 DIAGNOSIS — F419 Anxiety disorder, unspecified: Secondary | ICD-10-CM

## 2018-07-20 DIAGNOSIS — F1020 Alcohol dependence, uncomplicated: Secondary | ICD-10-CM

## 2018-07-20 MED ORDER — ESCITALOPRAM OXALATE 10 MG PO TABS: 10.0000 mg | ORAL_TABLET | Freq: Every day | ORAL | 2 refills | Status: DC

## 2018-07-20 NOTE — Progress Notes (Signed)
[facility] Follow-up Outpatient CDIOP Date: 07/20/2018  Admission Date: 07/13/2018  Sobriety date:06/22/2018  Subjective: 'I'm doing good"  HPI : Initial CD IOP Provider FU Pt is seen 1 week after Initial Evaluation and reports he is doing well. Counselor reports pt feeling needfor THC and UDS is + for low level 6/15. At his Initial evaluation he acknowledged understanding CD IOP is abstinence based program. When given handout on damage caused by [facility] on brain;genetics and sperm pt admits, at this point is " just doing it to be doing it (habit)". He had not received response to his inquiry about libido/fatigue and is shown Dr Colman Cater response to cut Lexapro dose to 10 mg for 2 weeks and monitor response. Tolerating Baclofen.Interested in CBD.  Review of Systems: Psychiatric: Agitation: No Hallucination: No Depressed Mood: SEVERAL DAYS Insomnia: No Hypersomnia: No Altered Concentration: SEVERAL DAYS Feels Worthless: MORE THAN 1/2 DAYS Grandiose Ideas: No Belief In Special Powers: No New/Increased Substance Abuse: No Compulsions: PERSISTENT THC USE Fatigue:more than 1/2 days Decreased Labido:More than 1/2 days   Neurologic: Headache: No Seizure: No Paresthesias: No  Current Medications: amLODipine 5 MG tablet Commonly known as: NORVASC   baclofen 10 MG tablet Commonly known as: LIORESAL Take 1 tablet (10 mg total) by mouth 3 (three) times daily.  buPROPion 150 MG 24 hr tablet Commonly known as: WELLBUTRIN XL Take 150 mg by mouth daily.  doxepin 50 MG capsule Commonly known as: SINEQUAN Take 1 capsule (50 mg total) by mouth at bedtime.  escitalopram 10 MG tablet Commonly known as: Lexapro Take 1 tablet (10 mg total) by mouth daily.  lisinopril 20 MG tablet Commonly known as: ZESTRIL   * lithium carbonate 450 MG CR tablet Commonly known as: ESKALITH Take 450 mg by mouth at bedtime.  * lithium carbonate 300 MG CR tablet Commonly known as: LITHOBID   losartan  100 MG tablet Commonly known as: COZAAR Take 1 tablet (100 mg total) by mouth daily.  naltrexone 50 MG tablet Commonly known as: DEPADE Take 1 tablet (50 mg total) by mouth daily.  OLANZapine 5 MG tablet Commonly known as: ZYPREXA Take 1 tablet (5 mg total) by mouth at bedtime.  * omeprazole 40 MG capsule Commonly known as: PRILOSEC   * omeprazole 20 MG capsule Commonly known as: PRILOSEC Take 20 mg by mouth daily.  pravastatin 10 MG tablet Commonly known as: PRAVACHOL Take 1 tablet (10 mg total) by mouth daily.  propranolol 10 MG tablet Commonly known as: INDERAL   valACYclovir 1000 MG tablet Commonly known as: VALTREX TAKE 1 TABLET (1,000 MG TOTAL) BY MOUTH 2 (TWO) TIMES DAILY.  (very important)  * This list has 4 medication(s) that are the same as other medications prescribed for you. Read the directions carefully, and ask your doctor or other care provider to review them with you.    Mental Status Examination  Appearance: Alert: Yes Attention: good  Cooperative: Yes Eye Contact: Good Speech: Clear and coherent Psychomotor Activity: Normal Memory/Concentration: Normal/intact Oriented: person, place, time/date and situation Mood: Euthymic Affect: Appropriate and Congruent Thought Processes and Associations: Coherent and Intact Fund of Knowledge: Good Thought Content: WDL Insight: Good Judgement: Good  UDS:6/15 + THC185/cutoff 50 PDMP clear  Diagnosis:  0 Alcohol use disorder, severe, dependence (HCC) 0 Bipolar disorder, in partial remission, most recent episode hypomanic ([place]) 0 Cannabis dependence ([place]) 0 Family history of alcoholism 0 Chronic anxiety 0 Hypertension, unspecified type  Assessment: SUDs in early partial remission  Treatment  Plan: Continue per admission                              CBD Information Handout given/ THC discussed                               Rx for 10 mg Lexapro sent                               FU 1 week Darlyne Russian, PA-CPatient  ID: Trenda Moots, male   DOB: 08/10/1975, 43 y.o.   MRN: [id number]

## 2018-07-21 ENCOUNTER — Other Ambulatory Visit: Payer: Self-pay

## 2018-07-21 ENCOUNTER — Other Ambulatory Visit (HOSPITAL_COMMUNITY): Payer: BC Managed Care – PPO | Admitting: Licensed Clinical Social Worker

## 2018-07-21 DIAGNOSIS — F1020 Alcohol dependence, uncomplicated: Secondary | ICD-10-CM

## 2018-07-21 NOTE — Progress Notes (Signed)
    Daily Group Progress Note  Program: CD-IOP   Group Time: 1-2:30pm  Participation Level: Active  Behavioral Response: Appropriate  Type of Therapy: Process Group  Topic:  The purpose of the process group this day is utilizing MI and CBT to discuss relationship dynamics in recovery. Clinician and group members processed decisions made when in active addiction which often do not like up with our values. Clinician and group members processed feelings of guilt related to another person's relapse/sobriety. Clinician presented mindfulness activity for managing uncomfortable feelings.   Group Time: 2:30pm-4  Participation Level: Active  Behavioral Response: Appropriate  Type of Therapy: Psycho-education Group  Topic:  Clinician presented the topic of assertive communication skills. Clinician and group members reviewed passive, aggressive, and assertive communication styles. Clinician presented clients with DBT Interpersonal effectiveness skills as well as 5 Finger Communication, focused on utilizing I-statements in conversations. Clinician and group members role played using assertive communication skills in daily life and as a part of relapse prevention and saying no to unhealthy behaviors. Clinician presented common sets of coping skills and the pros and cons of each. Clinician requested group members attempt at least one skill before the next group.    Summary: Client presents with flat affect reporting mood as 'fine.' Client states he worked most of last night and is finding staying busy is helpful to his recovery. Client reports sobriety date from alcohol is 06/07/18. Client engaged in discussions related to relationship dynamics following recovery. Client shared experience in sober living home with group and how he planned support and accountability into his living situation follow treatment by staying with his parents who do not drink. Client shared incidents where ineffective  communication, including lying, due to substance use spiraled into more trouble resulting in jail time and resigning from his job to avoid getting fired. Client engaged in role play for communication however minimally used taught skill for assertive communication. Clinician challenged client to identify and plan for when communication will be difficult and it would be easy to fall into old habits.    Family Program: Family present? No   Name of family member(s): NA  UDS collected: No   AA/NA attended?: No  Sponsor?: No   Micheline Chapman, LCSW, LCAS

## 2018-07-21 NOTE — Progress Notes (Signed)
    Daily Group Progress Note  Program: CD-IOP   Group Time: 1pm-2:30pm  Participation Level: Active  Behavioral Response: Appropriate  Type of Therapy: Process Group  Topic: Clinician checked in with group members, assessing for SI/HI/psychosis and overall level of functioning. Clinician provided client for clients to discuss recent stressors and effect on recovery. Clinician and group members discussed skills used over the weekend to maintain sobriety in difficult moments. Clinician utilized Loaded Questions to facilitate discussion on how Strategy for avoiding difficult people, worst habits, and what is holding clients back in treatment, effects early recovery.     Group Time: 2:30pm-4pm  Participation Level: Active  Behavioral Response: Appropriate  Type of Therapy: Psycho-education Group  Topic: Clinician provided psycho-educational video on The Biology of Addiction. Clinician provided psycho-educational information on Post-Acute Withdraw Syndrome. Clinician and group members discussed what these symptoms may look like in early recovery, and ways to manage symptoms to avoid relapse.    Summary: Client reports having been presented with most of the psycho-educational information previously in residential programs and does not find this helpful. Client was able to provide examples of PAWS symptoms he experienced to other group members. Client reports limited stressors as currently he is now only working and is staying with his parents to avoid isolating which added to his recent relapse.   Family Program: Family present? No   Name of family member(s): NA  UDS collected: Yes Results: pending  AA/NA attended?: No  Sponsor?: No   Merlie Noga A Hala Narula, LCSW, LCAS

## 2018-07-21 NOTE — Progress Notes (Signed)
  Virtual Visit via Video Note  I connected with Thomas Williamson on 07/21/18 at 10am by a video enabled telemedicine application and verified that I am speaking with the correct person using two identifiers.   I discussed the limitations of evaluation and management by telemedicine and the availability of in person appointments. The patient expressed understanding and agreed to proceed.  I discussed the assessment and treatment plan with the patient. The patient was provided an opportunity to ask questions and all were answered. The patient agreed with the plan and demonstrated an understanding of the instructions.   The patient was advised to call back or seek an in-person evaluation if the symptoms worsen or if the condition fails to improve as anticipated.  I provided 30 minutes of non-face-to-face time during this encounter.   Olegario Messier, LCSW   THERAPIST PROGRESS NOTE  Session Time: 10am-10:30am  Participation Level: Active  Behavioral Response: CasualAlertEuthymic  Type of Therapy: Individual Therapy  Treatment Goals addressed: Coping and Diagnosis: Alcohol Use Disorder  Interventions: Motivational Interviewing  Summary: Thomas Williamson is a 43 y.o. male who presents for individual session as part of CD-IOP.  Client reports not finding psycho-educational material helpful due to history of treatment. Client agrees to attempt abstinence from all substances, including marijuana, for the duration of the program. Client notes at this point it is habit. Client would like to work on strengthening coping skills to address his mental health, including being his own critic, guilt/shame, feeling weak for feeling sad, and previous damage to work and relationships.  Suicidal/Homicidal: Nowithout intent/plan  Therapist Response: Reviews progress on individual therapy goals of gaining and maintaining sobriety, building healthy support system for recovery, and improving coping skills for mental  health without resulting to substance use.  Plan: Return again in 1 weeks.  Diagnosis: Axis I: Alcohol Abuse        Olegario Messier, LCSW 07/21/2018

## 2018-07-25 ENCOUNTER — Other Ambulatory Visit: Payer: Self-pay

## 2018-07-25 ENCOUNTER — Other Ambulatory Visit (HOSPITAL_COMMUNITY): Payer: BC Managed Care – PPO | Admitting: Licensed Clinical Social Worker

## 2018-07-25 DIAGNOSIS — F1020 Alcohol dependence, uncomplicated: Secondary | ICD-10-CM

## 2018-07-25 DIAGNOSIS — F1021 Alcohol dependence, in remission: Secondary | ICD-10-CM | POA: Diagnosis not present

## 2018-07-25 DIAGNOSIS — F3171 Bipolar disorder, in partial remission, most recent episode hypomanic: Secondary | ICD-10-CM

## 2018-07-25 NOTE — Progress Notes (Signed)
Daily Group Progress Note  Program: CD-IOP   Group Time: 1pm-2:30pm  Participation Level: Active  Behavioral Response: Appropriate  Type of Therapy: Process Group  Topic: The purpose of process group is to address recent successes and barriers to recovery and sobriety. Clinician and group members completed and processed Self-Assessment and discussed goals for areas needing progress.    Group Time: 2:30pm-4pm  Participation Level: Active  Behavioral Response: Appropriate  Type of Therapy: Psycho-education Group  Topic: The purpose of the psycho-educational group is to provide information of the Cycle of Change. Group members reviewed Assessing Stage of Change Worksheet and discussed how current behaviors support or contradict identified behaviors for each stage of change.    Summary: Client met with PA, see note for additional details. Client and group discussed the importance of CDIOP being an abstinence based group and part of creating new habits includes attempting to maintain sobriety from all substances. Client had limited engaged in Self-Assessment. Client believes he has completed most actions in Action Stage of cycle of change and some tasks in maintenance. Clinician challenged client with some points in previous stages, inquiring about continued substance use despite not being substance of choice. Client encouraged to considered what needs were not fully met prior to last relapse which need to be addressed now, including behaviors which have and have not stayed the same.   Family Program: Family present? No   Name of family member(s): N/A  UDS collected: No Results: last positive for cannabis  AA/NA attended?: No  Sponsor?: No   Karissa A Brone, LCSW, LCAS

## 2018-07-25 NOTE — Progress Notes (Signed)
    Daily Group Progress Note  Program: CD-IOP   Group Time: 1pm-2;30pm  Participation Level: Active  Behavioral Response: Appropriate  Type of Therapy: Process Group  Topic: Clinician checked in with group members, assessing for SI/HI/psychosis/substance use, and overall level of functioning. Clinician and group members discussed supports and stressors over the weekend. Clinician and group members discussed options for non-addictive substances as replacements in different situations, including non-alcoholic beer and vaping. Clinician and group members discussed being mindful of triggers for relapse, especially in early recovery. Clinician normalized replacement behaviors/substances in early recovery. Clinician presented the topic of vulnerability in recovery. Clinician utilized Almond [employer] Power of Vulnerability video as supplemental material.     Group Time: 2:30pm-4pm  Participation Level: Active  Behavioral Response: Appropriate  Type of Therapy: Psycho-education Group  Topic: Clinician provided mindfulness meditation focused on self-acceptance and 'being enough.' Clinician and group members discussed 'How Do I Want Others to See Me?' and 'House Divided' worksheets to recognize and discuss disconnect between inner and outer self, and the differences based on sobriety. Clients identified positive and negative traits and activities and areas of growth in self.   Summary: Client engaged in group discussions. Client was receptive to challenges about changes following treatment as well as staying in comfort zone rather than making large changes for long term recovery. Client stated "maybe I need to get more uncomfortable to do something different this time" in response to allowing vaping to be an options for coping as he previously allowed alcohol to be an option for coping. Client reports he will attempt activities without vaping before next group and attempt to find a mindfulness  skill to use in place. Client noted that vulnerability which he could continue to work on is based around his depression and view of self.   Family Program: Family present? No   Name of family member(s): N/A  UDS collected: Yes Results: pending  AA/NA attended?: No  Sponsor?: No   Thomas Hark A Thomas Glennon, LCSW, LCAS

## 2018-07-26 ENCOUNTER — Other Ambulatory Visit: Payer: Self-pay

## 2018-07-26 ENCOUNTER — Ambulatory Visit (INDEPENDENT_AMBULATORY_CARE_PROVIDER_SITE_OTHER): Payer: BC Managed Care – PPO | Admitting: Psychiatry

## 2018-07-26 DIAGNOSIS — F3175 Bipolar disorder, in partial remission, most recent episode depressed: Secondary | ICD-10-CM

## 2018-07-26 DIAGNOSIS — F1021 Alcohol dependence, in remission: Secondary | ICD-10-CM | POA: Diagnosis not present

## 2018-07-26 DIAGNOSIS — F3171 Bipolar disorder, in partial remission, most recent episode hypomanic: Secondary | ICD-10-CM

## 2018-07-26 MED ORDER — BUPROPION HCL ER (XL) 300 MG PO TB24: 300.0000 mg | ORAL_TABLET | Freq: Every day | ORAL | 2 refills | Status: DC

## 2018-07-26 MED ORDER — LITHIUM CARBONATE ER 300 MG PO TBCR: 600.0000 mg | EXTENDED_RELEASE_TABLET | Freq: Every day | ORAL | 2 refills | Status: DC

## 2018-07-26 MED ORDER — OLANZAPINE 5 MG PO TABS: 5.0000 mg | ORAL_TABLET | Freq: Every day | ORAL | 0 refills | Status: DC

## 2018-07-26 MED ORDER — DOXEPIN HCL 50 MG PO CAPS: 50.0000 mg | ORAL_CAPSULE | Freq: Every day | ORAL | 0 refills | Status: DC

## 2018-07-26 NOTE — Progress Notes (Signed)
BH MD/PA/NP OP Progress Note  07/26/2018 10:18 AM Thomas Williamson  MRN:  [id number] Interview was conducted by phone and I verified that I was speaking with the correct person using two identifiers. I discussed the limitations of evaluation and management by telemedicine and  the availability of in person appointments. Patient expressed understanding and agreed to proceed.  Chief Complaint: Low libido, low energy.  HPI: 43 yo male with BPAD and alcohol use disorder in early remission (no alcohol since May 5th this year). He relapsed, went to 30 day rehab in [name] and moved to his parents home where there is no alcohol. He participates in EtOH-IOP at [facility]. Mood neutral, sleep and appetite good. He does not use doxepin often. Denies feeling depressed, no psychosis. Lithium dose was increased to 300 mg bid while in [name]. He has complained of loss of libido and we decreased dose of Lexapro (to 10 mg 8 days ago - no improvement.  He takes olanzapine prn anxiety, doxepin for insomnia, lithium for mood stabilization and naltrexone for alcohol craving. Thomas Williamson continues to complain of having low libido and energy.   Visit Diagnosis:    ICD-10-CM   1. Alcohol use disorder, severe, in early remission ([place])  F10.21   2. Bipolar disorder, in partial remission, most recent episode hypomanic ([place])  F31.71     Past Psychiatric History: Please see intake H&P.  Past Medical History:  Past Medical History:  Diagnosis Date  . Allergy   . Anxiety   . Bipolar disorder ([place])   . Depression   . GERD (gastroesophageal reflux disease)   . Hx of hepatitis C 10/05/2017   -treated in 2019 -hepatology recommended no further surveillance needed except for LFTs with labs and see hepatologist if elevated  . Hypertension   . IBS (irritable bowel syndrome)   . PONV (postoperative nausea and vomiting)     Past Surgical History:  Procedure Laterality Date  . ANTERIOR CRUCIATE LIGAMENT REPAIR  2010  . INGUINAL HERNIA  REPAIR  02/23/2012   Procedure: LAPAROSCOPIC BILATERAL INGUINAL HERNIA REPAIR;  Surgeon: Shann Medal, MD;  Location: [facility];  Service: General;  Laterality: Bilateral;  Laparoscopic Bilateral Inguinal Hernia Repair with mesh  . INSERTION OF MESH  02/23/2012   Procedure: INSERTION OF MESH;  Surgeon: Shann Medal, MD;  Location: [facility];  Service: General;  Laterality: N/A;  . NOSE SURGERY  [phone]    Family Psychiatric History: None  Family History:  Family History  Problem Relation Age of Onset  . Hypertension Mother   . Hypertension Father     Social History:  Social History   Socioeconomic History  . Marital status: Single    Spouse name: Not on file  . Number of children: 0  . Years of education: Not on file  . Highest education level: Bachelor's degree (e.g., BA, AB, BS)  Occupational History  . Not on file  Social Needs  . Financial resource strain: Somewhat hard  . Food insecurity    Worry: Sometimes true    Inability: Sometimes true  . Transportation needs    Medical: No    Non-medical: No  Tobacco Use  . Smoking status: Never Smoker  . Smokeless tobacco: Never Used  Substance and Sexual Activity  . Alcohol use: Not Currently    Alcohol/week: 8.0 standard drinks    Types: 8 Shots of liquor per week  . Drug use: Yes    Types: Marijuana    Comment: week ago  .  Sexual activity: Not Currently  Lifestyle  . Physical activity    Days per week: 0 days    Minutes per session: 0 min  . Stress: Very much  Relationships  . Social Herbalist on phone: More than three times a week    Gets together: Twice a week    Attends religious service: Never    Active member of club or organization: Yes    Attends meetings of clubs or organizations: More than 4 times per year    Relationship status: Never married  Other Topics Concern  . 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 Hydrocodone     UPSETS STOMACH    Metabolic Disorder Labs: No results found for: HGBA1C, MPG No results found for: PROLACTIN Lab Results  Component Value Date   CHOL 208 (H) 03/31/2016   TRIG 252.0 (H) 03/31/2016   HDL 45.10 03/31/2016   CHOLHDL 5 03/31/2016   VLDL 50.4 (H) 03/31/2016   Lab Results  Component Value Date   TSH 0.74 03/31/2016   TSH 0.48 09/20/2013    Therapeutic Level Labs: Lab Results  Component Value Date   LITHIUM 0.6 12/18/2016   No results found for: VALPROATE No components found for:  CBMZ  Current Medications: Current Outpatient Medications  Medication Sig Dispense Refill  . amLODipine (NORVASC) 5 MG tablet     . baclofen (LIORESAL) 10 MG tablet Take 1 tablet (10 mg total) by mouth 3 (three) times daily. 90 tablet 1  . buPROPion (WELLBUTRIN XL) 150 MG 24 hr tablet Take 150 mg by mouth daily.    Marland Kitchen doxepin (SINEQUAN) 50 MG capsule Take 1 capsule (50 mg total) by mouth at bedtime. 90 capsule 0  . lisinopril (ZESTRIL) 20 MG tablet     . lithium carbonate (ESKALITH) 450 MG CR tablet Take 450 mg by mouth at bedtime.    Marland Kitchen losartan (COZAAR) 100 MG tablet Take 1 tablet (100 mg total) by mouth daily. 90 tablet 0  . naltrexone (DEPADE) 50 MG tablet Take 1 tablet (50 mg total) by mouth daily. 30 tablet 2  . OLANZapine (ZYPREXA) 5 MG tablet Take 1 tablet (5 mg total) by mouth at bedtime. 90 tablet 0  . omeprazole (PRILOSEC) 20 MG capsule Take 20 mg by mouth daily.    Marland Kitchen omeprazole (PRILOSEC) 40 MG capsule     . pravastatin (PRAVACHOL) 10 MG tablet Take 1 tablet (10 mg total) by mouth daily. 90 tablet 0  . propranolol (INDERAL) 10 MG tablet     . valACYclovir (VALTREX) 1000 MG tablet TAKE 1 TABLET (1,000 MG TOTAL) BY MOUTH 2 (TWO) TIMES DAILY. 20 tablet 0   No current facility-administered  medications for this visit.     Psychiatric Specialty Exam: Review of Systems  Constitutional: Positive for malaise/fatigue.  Gastrointestinal: Positive for heartburn.  All other systems reviewed and are negative.   There were no vitals taken for this visit.There is no height or weight on file to calculate BMI.  General Appearance: NA  Eye Contact:  NA  Speech:  Clear and Coherent  Volume:  Normal  Mood:  Euthymic  Affect:  NA  Thought Process:  Goal Directed  Orientation:  Full (Time, Place, and Person)  Thought Content: Logical   Suicidal Thoughts:  No  Homicidal Thoughts:  No  Memory:  Immediate;   Good Recent;   Good Remote;   Good  Judgement:  Fair  Insight:  Fair  Psychomotor Activity:  NA  Concentration:  Concentration: Good  Recall:  Good  Fund of Knowledge: Good  Language: Good  Akathisia:  Negative  Handed:  Right  AIMS (if indicated): not done  Assets:  Communication Skills Desire for Improvement Financial Resources/Insurance Housing Physical Health Resilience Social Support  ADL's:  Intact  Cognition: WNL  Sleep:  Good   Screenings: AUDIT     Counselor from 07/11/2018 in [place]  Alcohol Use Disorder Identification Test Final Score (AUDIT)  30    GAD-7     Counselor from 07/11/2018 in [place]  Total GAD-7 Score  9    PHQ2-9     Counselor from 07/11/2018 in [place]  PHQ-2 Total Score  2  PHQ-9 Total Score  8       Assessment and Plan: 43 yo male with BPAD and alcohol use disorder in early remission (no alcohol since May 5th this year). He relapsed, went to 30 day rehab in [name] and moved to his parents home where there is no alcohol. He participates in [facility]OP at [facility]. Mood neutral, sleep and appetite good. He does not use doxepin often. Denies feeling depressed, no psychosis. Lithium dose was increased to 300 mg bid while in  [name]. He has complained of loss of libido and we decreased dose of Lexapro (to 10 mg 8 days ago - no improvement.  He takes olanzapine prn anxiety, doxepin for insomnia, lithium for mood stabilization and naltrexone for alcohol craving. Alver continues to complain of having low libido and energy.   Plan: DC Lexapro and increase Wellbutrin XL to 300 m g for energy (perhaps can positively affect libido as well). Change lithium CR from 300 mg bid to 600 mg at HS. Continue naltrexone, doxepin and olanzapine unchanged. Return to clinic in two months.    Stephanie Acre, MD 07/26/2018, 10:18 AM

## 2018-07-27 ENCOUNTER — Ambulatory Visit (HOSPITAL_COMMUNITY): Payer: BC Managed Care – PPO | Admitting: Licensed Clinical Social Worker

## 2018-07-27 ENCOUNTER — Other Ambulatory Visit: Payer: Self-pay

## 2018-07-27 ENCOUNTER — Encounter (HOSPITAL_COMMUNITY): Payer: Self-pay | Admitting: Medical

## 2018-07-27 ENCOUNTER — Other Ambulatory Visit (INDEPENDENT_AMBULATORY_CARE_PROVIDER_SITE_OTHER): Payer: BC Managed Care – PPO | Admitting: Licensed Clinical Social Worker

## 2018-07-27 DIAGNOSIS — I10 Essential (primary) hypertension: Secondary | ICD-10-CM

## 2018-07-27 DIAGNOSIS — Z811 Family history of alcohol abuse and dependence: Secondary | ICD-10-CM

## 2018-07-27 DIAGNOSIS — F419 Anxiety disorder, unspecified: Secondary | ICD-10-CM

## 2018-07-27 DIAGNOSIS — F1021 Alcohol dependence, in remission: Secondary | ICD-10-CM

## 2018-07-27 DIAGNOSIS — F1220 Cannabis dependence, uncomplicated: Secondary | ICD-10-CM

## 2018-07-27 DIAGNOSIS — F3175 Bipolar disorder, in partial remission, most recent episode depressed: Secondary | ICD-10-CM

## 2018-07-27 NOTE — Progress Notes (Signed)
[facility] Follow-up Outpatient CDIOP Date: 07/27/18  Admission Date:07/13/18  Sobriety date: 06/22/18-POT 06/07/18 Alcohol  Subjective: "Doing good"  HPI:Pt seen 1 week ago: HPI : Initial CD IOP Provider FU Pt is seen 1 week after Initial Evaluation and reports he is doing well. Counselor reports pt feeling needfor THC and UDS is + for low level 6/15. At his Initial evaluation he acknowledged understanding CD IOP is abstinence based program. When given handout on damage caused by [facility] on brain;genetics and sperm pt admits, at this point is " just doing it to be doing it (habit)". He had not received response to his inquiry about libido/fatigue and is shown Dr Colman Cater response to cut Lexapro dose to 10 mg for 2 weeks and monitor response. Tolerating Baclofen.Interested in CBD. Assessment: SUDs in early partial remission  Treatment Plan: Continue per admission                              CBD Information Handout given/ THC discussed                               Rx for 10 mg Lexapro sent                               FU 1 week  Had meeting with Dr Mamie Nick 07/26/2018 10:18 AM HPI: 43yo male withBPAD and alcohol use disorderin early remission (no alcohol since May 5th this year). He relapsed, went to 30 day rehab in [name] and moved to his parents home where there is no alcohol. He participates in EtOH[facility]IOP at [facility]. Moodneutral, sleep and appetite good. He does not use doxepin often. Denies feeling depressed, no psychosis. Lithium dose was increased to 300 mg bid while in [name]. He has complained of loss of libido and we decreased dose of Lexapro (to 10 mg 8 days ago - no improvement.  He takesolanzapine prn anxiety, doxepin for insomnia, lithium for mood stabilization and naltrexone for alcohol craving. [place] continues to complain of having low libido and energy.  Assessment and Plan: 43yo male withBPAD and alcohol use disorderin early remission (no alcohol since May 5th this year).  He relapsed, went to 30 day rehab in [name] and moved to his parents home where there is no alcohol. He participates in EtOH-IOP at [facility]. Moodneutral, sleep and appetite good. He does not use doxepin often. Denies feeling depressed, no psychosis. Lithium dose was increased to 300 mg bid while in [name]. He has complained of loss of libido and we decreased dose of Lexapro (to 10 mg 8 days ago - no improvement.  He takesolanzapine prn anxiety, doxepin for insomnia, lithium for mood stabilization and naltrexone for alcohol craving. [place] continues to complain of having low libido and energy.   Plan: DC Lexapro and increase Wellbutrin XL to 300 mg for energy (perhaps can positively affect libido as well). Change lithium CR from 300 mg bid to 600 mg at HS. Continue naltrexone, doxepin and olanzapine unchanged. Return to clinic in two months.  Today he reports satisfied with current plans and is interested in trying CBD.Reviewed formulations.Will see him in 2 weeks sooner if needed.   Review of Systems:No change Psychiatric: Agitation: No Hallucination: No Depressed Mood: SEVERAL DAYS Insomnia: No Hypersomnia: No Altered Concentration: SEVERAL DAYS Feels Worthless: MORE THAN 1/2 DAYS  Grandiose Ideas: No Belief In Special Powers: No New/Increased Substance Abuse: No Compulsions: PERSISTENT THC USE Fatigue:more than 1/2 days Decreased Labido:More than 1/2 days   Neurologic: Headache: No Seizure: No Paresthesias: No  Current Medications: amLODipine 5 MG tablet Commonly known as: NORVASC   baclofen 10 MG tablet Commonly known as: LIORESAL Take 1 tablet (10 mg total) by mouth 3 (three) times daily.  buPROPion 300 MG 24 hr tablet Commonly known as: WELLBUTRIN XL Take 1 tablet (300 mg total) by mouth daily.  doxepin 50 MG capsule Commonly known as: SINEQUAN Take 1 capsule (50 mg total) by mouth at bedtime.  lisinopril 20 MG tablet Commonly known as: ZESTRIL   lithium carbonate 300  MG CR tablet Commonly known as: LITHOBID Take 2 tablets (600 mg total) by mouth at bedtime.  losartan 100 MG tablet Commonly known as: COZAAR Take 1 tablet (100 mg total) by mouth daily.  naltrexone 50 MG tablet Commonly known as: DEPADE Take 1 tablet (50 mg total) by mouth daily.  OLANZapine 5 MG tablet Commonly known as: ZYPREXA Take 1 tablet (5 mg total) by mouth at bedtime.  * omeprazole 40 MG capsule Commonly known as: PRILOSEC   * omeprazole 20 MG capsule Commonly known as: PRILOSEC Take 20 mg by mouth daily.  pravastatin 10 MG tablet Commonly known as: PRAVACHOL Take 1 tablet (10 mg total) by mouth daily.  propranolol 10 MG tablet Commonly known as: INDERAL   valACYclovir 1000 MG tablet Commonly known as: VALTREX TAKE 1 TABLET (1,000 MG TOTAL) BY MOUTH 2 (TWO) TIMES DAILY.       Mental Status Examination NO Change Appearance: Alert: Yes Attention: good  Cooperative: Yes Eye Contact: Good Speech: Clear and coherent Psychomotor Activity: Normal Memory/Concentration: Normal/intact Oriented: person, place, time/date and situation Mood: Euthymic Affect: Appropriate and Congruent Thought Processes and Associations: Coherent and Intact Fund of Knowledge: Good Thought Content: WDL Insight: Good Judgement: Good  URK:[id number]  Diagnosis:  0 Alcohol use disorder, severe, in early remission (H[facility]) 0 Bipolar disorder, in partial remission, most recent episode depressed ([facility]) 0 Cannabis dependence ([facility]) 0 Family history of alcoholism 0 Chronic anxiety 0 Hypertension, unspecified type   Assessment: Improvimg with abstinence-Psych meds and issues being addressed by Dr Mamie Nick  Treatment Plan:  Continue per CD IOP Admission and Dr Montel Culver    Darlyne Russian, PA-CPatient ID: Trenda Moots, male   DOB: 1975-02-12, 43 y.o.   MRN: [id number]

## 2018-07-28 ENCOUNTER — Other Ambulatory Visit (HOSPITAL_COMMUNITY): Payer: BC Managed Care – PPO | Admitting: Licensed Clinical Social Worker

## 2018-07-28 ENCOUNTER — Other Ambulatory Visit: Payer: Self-pay

## 2018-07-28 DIAGNOSIS — F3175 Bipolar disorder, in partial remission, most recent episode depressed: Secondary | ICD-10-CM

## 2018-07-28 DIAGNOSIS — F1021 Alcohol dependence, in remission: Secondary | ICD-10-CM

## 2018-07-28 DIAGNOSIS — F1220 Cannabis dependence, uncomplicated: Secondary | ICD-10-CM

## 2018-08-01 ENCOUNTER — Other Ambulatory Visit (HOSPITAL_COMMUNITY): Payer: BC Managed Care – PPO | Admitting: Licensed Clinical Social Worker

## 2018-08-01 ENCOUNTER — Other Ambulatory Visit: Payer: Self-pay

## 2018-08-01 DIAGNOSIS — F1021 Alcohol dependence, in remission: Secondary | ICD-10-CM

## 2018-08-01 DIAGNOSIS — F1220 Cannabis dependence, uncomplicated: Secondary | ICD-10-CM

## 2018-08-01 DIAGNOSIS — F3175 Bipolar disorder, in partial remission, most recent episode depressed: Secondary | ICD-10-CM

## 2018-08-01 NOTE — Progress Notes (Signed)
    Daily Group Progress Note  Program: CD-IOP   Group Time: 1pm-2:30pm  Participation Level: Active  Behavioral Response: Appropriate  Type of Therapy: Process Group  Topic: Clinician checked in with clients, assessing for SI/HI/psychosis and substance use. Clinician allowed time for group members to process recent stressors and effect on recovery. Clinician presented the topic of Guilt vs Shame. Clinician and group members processed the differences and the use of self-compassion as a skill to address shame. Clinician and group members processed underlying feelings, including guilt and shame, which can present as anger. Group members processed how expressing anger was learned, and identified common triggers. Clinician and group members processed how active substance use effected their anger response.   Group Time: 2:30pm-4pm  Participation Level: Active  Behavioral Response: Appropriate  Type of Therapy: Psycho-education Group  Topic: Clinician facilitated mindfulness meditation. Clinician presented the psycho-educational topic of Cognitive Distortions. Clinician reviewed cognitive triangle and connection of thoughts on feelings and behaviors. Clinician and group members reviewed ways to challenge cognitive distortions.     Summary: Client identified areas of guilt and shame. Client noted 'Catholic Guilt' from a young age. Client verbalized understanding between guilt and shame. Client acknowledged feeling shame related to his addiction and how it has effected his family. Client discussed how some cognitive distortions have effected his negative self talk. Client was able to identify positive traits about self when prompted however reports his depressive symptoms often cause difficulty in supportive self talk. Client is able to demonstrate creating alternative thoughts to cognitive distortions in session.   Family Program: Family present? No   Name of family member(s): N/A  UDS  collected: No Results: reported would be marijuana  AA/NA attended?: No. Agrees to look into online meetings. Identifies he is using lack of time as an excuse to avoid meetings.  Sponsor?: No   Shon Baton Climmie Buelow, LCSW, LCAS

## 2018-08-02 NOTE — Progress Notes (Signed)
Daily Group Progress Note  Program: CD-IOP   Group Time: 1pm-2:30pm  Participation Level: Active  Behavioral Response: Appropriate  Type of Therapy: Process Group  Topic: Clinician met with clients, assessing for SI/HI/psychosis and overall level of functioning. Clinician and group members processed reasons for engaging in services and goals for treatment. Group members processed motivation for treatment. Clinician and group members challenged levels of motivation in group members. Clinician utilized motivational interviewing to develop discrepancies in client verbal responses and recent actions. Clinician challenged group members to be mindful of cognitive distortions recently reviewed and accepting responsibility for own behaviors and recovery. Clinician and group members completed cognitive distortion list and how to identify these to address ruminating thoughts. Clinician provided clients with handout of STOPP skill to assist in identifying distorted thinking and allow opportunity to create alternative, realistic thought.    Group Time: 2:30pm-4pm  Participation Level: Active  Behavioral Response: Appropriate  Type of Therapy: Psycho-education Group  Topic: Group members discussed reasons to utilize AA meetings and options for online or face to face meetings. Clinician presented the Distress Tolerance Skill of 'Letting Go of Emotional Suffering: Mindfulness of Your Current Emotion." Clinician and group members reviewed identifying and observing emotions non judgmentally, and accepting that emotions are not permanent. Clinician presented sensory options for physical response to stress or cravings as well as additional options to add into daily life for long term changes. Clinician encouraged clients to create a self-affirmation. Clinician inquired about plans to self-care over the weekend and activities planned to support recovery.    Summary: Client engaged in group  discussions. Client challenged group member and provided support related to ambivalence toward total sobriety vs 'managed' use. Client identified cognitive distortions used to support some self sabotaging or excuses for lack of changed behaviors. Client identified that negative self talk and personal criticism is a primary concern related to his mental health. Client has utilized no substances since last group.   Family Program: Family present? No   Name of family member(s): NA  UDS collected: No   AA/NA attended?: No 0.  Sponsor?: No    A , LCSW, LCAS        

## 2018-08-03 ENCOUNTER — Other Ambulatory Visit: Payer: Self-pay

## 2018-08-03 ENCOUNTER — Other Ambulatory Visit (HOSPITAL_COMMUNITY): Payer: BC Managed Care – PPO | Attending: Psychiatry | Admitting: Licensed Clinical Social Worker

## 2018-08-03 DIAGNOSIS — F1220 Cannabis dependence, uncomplicated: Secondary | ICD-10-CM | POA: Diagnosis not present

## 2018-08-03 DIAGNOSIS — Z9119 Patient's noncompliance with other medical treatment and regimen: Secondary | ICD-10-CM | POA: Diagnosis not present

## 2018-08-03 DIAGNOSIS — F419 Anxiety disorder, unspecified: Secondary | ICD-10-CM | POA: Insufficient documentation

## 2018-08-03 DIAGNOSIS — Z79899 Other long term (current) drug therapy: Secondary | ICD-10-CM | POA: Diagnosis not present

## 2018-08-03 DIAGNOSIS — B001 Herpesviral vesicular dermatitis: Secondary | ICD-10-CM | POA: Diagnosis not present

## 2018-08-03 DIAGNOSIS — Z811 Family history of alcohol abuse and dependence: Secondary | ICD-10-CM | POA: Insufficient documentation

## 2018-08-03 DIAGNOSIS — F3175 Bipolar disorder, in partial remission, most recent episode depressed: Secondary | ICD-10-CM | POA: Insufficient documentation

## 2018-08-03 DIAGNOSIS — I10 Essential (primary) hypertension: Secondary | ICD-10-CM | POA: Insufficient documentation

## 2018-08-03 DIAGNOSIS — F1021 Alcohol dependence, in remission: Secondary | ICD-10-CM | POA: Insufficient documentation

## 2018-08-03 NOTE — Progress Notes (Signed)
    Daily Group Progress Note  Program: CD-IOP   Group Time: 1pm-2:30pm  Participation Level: Active  Behavioral Response: Appropriate  Type of Therapy: Process Group  Topic: Clinician checked in with clinician, assessing for SI/HI/psychosis, overall level of functioning and substance use. Clinician facilitated processing related to weekend stressors and their effect on recovery. Clinician inquired about skills attempted over the weekend. Clinician reviewed skills from previous week.    Group Time: 2:30pm-4pm  Participation Level: Active  Behavioral Response: Minimizing  Type of Therapy: Psycho-education Group  Topic: Clinician presented the topic of resentment and forgiveness. Clinician and group members discussed situations leading to resentment, additional feelings, thoughts, and behaviors related to resentment. Clinician and group members identified core believes and processed how a violation of these by self or others can lead to guilt or resentment. Clinician and group members processed violating boundaries is more likely when actively using substances.    Summary: Clinician presented with congruent mood and affect. Client reported minimal concern with triggers over the weekend due to working and being at home with his parents. Client identified some concerns related to taking his sick pet to the vet but denies this would be a trigger for relapse. Client utilized 9/11 as an example of his resentment for activity. Client was able to identify related feelings and beliefs which were effected by this event and how that resentment has effected his behaviors. Client struggled to connect past and present emotions and how this relates to his sobriety. Client report again he will look up online meetings to attend on the listed website but has not attended any meetings yet.   Family Program: Family present? No   Name of family member(s): NA  UDS collected: Yes Results: Pending AA/NA  attended?: No  Sponsor?: No   Karissa A Brone, LCSW, LCAS

## 2018-08-04 ENCOUNTER — Other Ambulatory Visit: Payer: Self-pay

## 2018-08-04 ENCOUNTER — Encounter (HOSPITAL_COMMUNITY): Payer: Self-pay | Admitting: Family Medicine

## 2018-08-04 ENCOUNTER — Other Ambulatory Visit (HOSPITAL_COMMUNITY): Payer: BC Managed Care – PPO | Admitting: Licensed Clinical Social Worker

## 2018-08-04 DIAGNOSIS — F1021 Alcohol dependence, in remission: Secondary | ICD-10-CM | POA: Diagnosis not present

## 2018-08-08 ENCOUNTER — Other Ambulatory Visit: Payer: Self-pay

## 2018-08-08 ENCOUNTER — Other Ambulatory Visit (HOSPITAL_COMMUNITY): Payer: BC Managed Care – PPO | Admitting: Licensed Clinical Social Worker

## 2018-08-08 DIAGNOSIS — F1021 Alcohol dependence, in remission: Secondary | ICD-10-CM

## 2018-08-08 DIAGNOSIS — F3175 Bipolar disorder, in partial remission, most recent episode depressed: Secondary | ICD-10-CM

## 2018-08-08 DIAGNOSIS — F1220 Cannabis dependence, uncomplicated: Secondary | ICD-10-CM

## 2018-08-08 NOTE — Progress Notes (Signed)
    Daily Group Progress Note  Program: CD-IOP   Group Time: 1pm-2:30pm  Participation Level: Active  Behavioral Response: Appropriate  Type of Therapy: Process Group  Topic: Clinician checked in with clients, assessing for SI/HI/psychosis and overall level of functioning.including cravings and relapses. Cliniciain and group members processed forgiveness in relation to recovery. Clinician and group members dicussed fortiveness related to identified incidents of resentment. Clinician and group members processed differences between forgiveness and Radical Acceptance as ways to address difficult situations with may not have an acceptable resolution.   Group Time: 2:30pm-4pm  Participation Level: Active  Behavioral Response: Appropriate  Type of Therapy: Psycho-education Group  Topic: Clinician presented the topic of Self-Compassion. Clinician shared how components of self compassion can be utilized to support recovery. Clinician utilized Marilynn Latino video as supportive material.   Summary: Client presented with congruent mood and affect. Client continues to report working most days and then spending time with his parents as how he is maintaining sobriety. Client has not engaged in face to face or online meetings. Client has not looked up meetings. Client reports struggle to forgive self and show compassion toward self but cannot identify specific reasons or events he would need to address. Client states with the example he used, forgiveness would not be an option based on the differences in core values.   Family Program: Family present? No   Name of family member(s): N/A  UDS collected: No Results: N/A  AA/NA attended?: No  Sponsor?: No   Benicia Bergevin A Giulietta Prokop, LCSW, LCAS

## 2018-08-08 NOTE — Progress Notes (Signed)
    Daily Group Progress Note  Program: CD-IOP   Group Time: 1pm-2:30pm  Participation Level: Active  Behavioral Response: Appropriate  Type of Therapy: Process Group  Topic: Clinician checked in with clients, assessing for SI/HI/psychosis and overall level of functioning including intensityof cravings and relapse. Clinician and group members processed recent stressors and effects on recovery. Clinician and group members processed uncomfortable or difficult  common feelings and thoughts common to cope with in early recovery.   Group Time: 2:30pm-4pm  Participation Level: Active  Behavioral Response: Appropriate  Type of Therapy: Psycho-education Group  Topic: Clinician and group members create Relapse Prevention Plan, focusing on identified triggers and common behaviors related to holiday weekends. Clinician provided supporttive feedback, challenging clients passive approach to recovery rather than active planning. Clinician praised groups creative solutions for common struggles over holidays.    Summary: Client reports he has no concerns about relapse over the holiday week due to working and being around his parents. Client struggled to identify triggers he would be planning for as he did not identify many currently. Client was challenged to consider previous triggers that are likely to come up again, such as stress at work, too much time along, or being bored. Client notes he has previously been told to get a hobby but has not found anything that interests him other than adding working out into his routine.   Family Program: Family present? No   Name of family member(s): NA  UDS collected: No Results: NA  AA/NA attended?: No  Sponsor?: No   Sharde Gover A Brittinee Risk, LCSW, LCAS

## 2018-08-09 NOTE — Progress Notes (Signed)
    Daily Group Progress Note  Program: CD-IOP   Group Time: 1pm-2:30pm  Participation Level: Active  Behavioral Response: Appropriate  Type of Therapy: Psycho-education Group  Topic: Clinician checked in with clients, assessing for SI/HI/psychosis and overall level of functioning. Clinician inquired about sobriety date and community support meetings attended. Psycho-educational group co-facilitated to utilize Yoga and mindfulness as a part of recovery.   Group Time: 2:30pm-4pm  Participation Level: Active  Behavioral Response: Appropriate  Type of Therapy: Process Group  Topic: Clinician and group members processed any challenges over the holiday weekend. Process group focused on creating SMART goals. Clinician facilitated discussion on creation of goals to support a sober lifestyle and goals of treatment. Members challenged group members about different stages of change and group members changes and intensity of motivation for changed behaviors. Group members shared goals as well as basis behind goals of maintaining sobriety or managing use. Group members discussed how previous treatment has affected decisions as well as consequences of continued use. Clients identified specific goals and how they will be measured.  Clients identified self care activity to support recovery before next group.    Summary: Client presents reporting he is not surprised about his drug screen positive for cannabis. Client notes he used to help him relax and does not see the problem since it is not alcohol. Client verbalizes understanding of the abstinent based program. Client was receptive to clinician developing discrepancies between clients comments on wanting to make changes different from completing previous treatments, and continuing to use any substance. Clinician and client had discussion around client allowing smoking to be an acceptable option. Client reports 'well it's always going to be an  option to deal with stress.' Client challenged other group members to be mindful of goals of managing use vs stopping drinking however did not see this relating to his ongoing use.    Family Program: Family present? No   Name of family member(s): NA  UDS collected: Yes Results: positive for cannabis. previous UDS negative for all substances  AA/NA attended?: No  Sponsor?: No   Hanley Woerner A Vida Nicol, LCSW, LCAS

## 2018-08-10 ENCOUNTER — Other Ambulatory Visit (HOSPITAL_COMMUNITY): Payer: BC Managed Care – PPO | Admitting: Medical

## 2018-08-10 ENCOUNTER — Other Ambulatory Visit: Payer: Self-pay

## 2018-08-10 DIAGNOSIS — F419 Anxiety disorder, unspecified: Secondary | ICD-10-CM

## 2018-08-10 DIAGNOSIS — Z811 Family history of alcohol abuse and dependence: Secondary | ICD-10-CM

## 2018-08-10 DIAGNOSIS — F1220 Cannabis dependence, uncomplicated: Secondary | ICD-10-CM

## 2018-08-10 DIAGNOSIS — F1021 Alcohol dependence, in remission: Secondary | ICD-10-CM | POA: Diagnosis not present

## 2018-08-10 DIAGNOSIS — B001 Herpesviral vesicular dermatitis: Secondary | ICD-10-CM

## 2018-08-10 DIAGNOSIS — F3175 Bipolar disorder, in partial remission, most recent episode depressed: Secondary | ICD-10-CM

## 2018-08-10 NOTE — Progress Notes (Signed)
   [facility] Follow-up Outpatient CDIOP Date: 08/10/2018  Admission Date:07/13/2018  Sobriety date: 06/07/18 Alcohol                           08/07/18 Pot  Subjective: "I just felt like it (smoke pot)"  HPI : 2 week CD IOP Provider FU.Pt used THC again.Counselor reports that pt seeems disinterested in planning relapse prevention on discharge stating he will do it when he leaves. With me pt seems more involved. He is not mentioning his psychiatric meds today.  Review of Systems: Psychiatric: Agitation: No Hallucination: No Depressed Mood: No complaint Insomnia: No Hypersomnia: No Altered Concentration: No Feels Worthless: No Grandiose Ideas: No Belief In Special Powers: No New/Increased Substance Abuse: Pot Compulsions: Pot use  Neurologic: Headache: No Seizure: No Paresthesias: No  Current Medications: amLODipine 5 MG tablet Commonly known as: NORVASC   baclofen 10 MG tablet Commonly known as: LIORESAL Take 1 tablet (10 mg total) by mouth 3 (three) times daily.  buPROPion 300 MG 24 hr tablet Commonly known as: WELLBUTRIN XL Take 1 tablet (300 mg total) by mouth daily.  doxepin 50 MG capsule Commonly known as: SINEQUAN Take 1 capsule (50 mg total) by mouth at bedtime.  lisinopril 20 MG tablet Commonly known as: ZESTRIL   lithium carbonate 300 MG CR tablet Commonly known as: LITHOBID Take 2 tablets (600 mg total) by mouth at bedtime.  losartan 100 MG tablet Commonly known as: COZAAR Take 1 tablet (100 mg total) by mouth daily.  naltrexone 50 MG tablet Commonly known as: DEPADE Take 1 tablet (50 mg total) by mouth daily.  OLANZapine 5 MG tablet Commonly known as: ZYPREXA Take 1 tablet (5 mg total) by mouth at bedtime.  * omeprazole 40 MG capsule Commonly known as: PRILOSEC   * omeprazole 20 MG capsule Commonly known as: PRILOSEC Take 20 mg by mouth daily.  pravastatin 10 MG tablet Commonly known as: PRAVACHOL Take 1 tablet (10 mg total) by mouth daily.   propranolol 10 MG tablet Commonly known as: INDERAL   valACYclovir 1000 MG tablet Commonly known as: VALTREX TAKE 1 TABLET (1,000 MG TOTAL) BY MOUTH 2 (TWO) TIMES DAILY.     Mental Status Examination  Appearance: Alert: Yes Attention: good  Cooperative: Yes Eye Contact: Good Speech: Clear and coherent Psychomotor Activity: Normal Memory/Concentration: Normal/intact Oriented: person, place, time/date and situation Mood: Euthymic Affect: Appropriate and Congruent Thought Processes and Associations: Coherent and Intact Fund of Knowledge: Good Thought Content: WDL Insight:Limited Judgement: Remains impaired by compulsive use of THC  UDS:+ THC  PDMP negative  Diagnosis:  0 Alcohol use disorder, severe, in early remission (HCC) 0 Cannabis dependence ([place]) 0 Family history of alcoholism 0 Chronic anxiety 0 Bipolar disorder, in partial remission, most recent episode depressed (HCC) 0 Recurrent cold sores  Assessment:Pt still triggered to compulsive use of THC  Treatment Plan: Reviewed again the biology of compulsive use of addictive substances. Did not explore his belief system around Pot at this time. Assigned BREAKING THE ADDICTION CYCLE video with worksheet. FU 1 week. Thomas Russian, PA-CPatient ID: Thomas Williamson, male   DOB: 10-05-1975, 43 y.o.   MRN: [id number]

## 2018-08-11 ENCOUNTER — Other Ambulatory Visit: Payer: Self-pay

## 2018-08-11 ENCOUNTER — Other Ambulatory Visit (HOSPITAL_COMMUNITY): Payer: BC Managed Care – PPO | Admitting: Licensed Clinical Social Worker

## 2018-08-11 DIAGNOSIS — F1021 Alcohol dependence, in remission: Secondary | ICD-10-CM | POA: Diagnosis not present

## 2018-08-11 DIAGNOSIS — F1220 Cannabis dependence, uncomplicated: Secondary | ICD-10-CM

## 2018-08-14 ENCOUNTER — Other Ambulatory Visit (HOSPITAL_COMMUNITY): Payer: Self-pay | Admitting: Medical

## 2018-08-15 ENCOUNTER — Other Ambulatory Visit: Payer: Self-pay

## 2018-08-15 ENCOUNTER — Encounter (HOSPITAL_COMMUNITY): Payer: Self-pay | Admitting: Medical

## 2018-08-15 ENCOUNTER — Other Ambulatory Visit (HOSPITAL_COMMUNITY): Payer: BC Managed Care – PPO

## 2018-08-15 ENCOUNTER — Encounter (HOSPITAL_COMMUNITY): Payer: Self-pay

## 2018-08-15 NOTE — Progress Notes (Signed)
    Daily Group Progress Note  Program: CD-IOP   Group Time: 1pm-2:30pm  Participation Level: Active  Behavioral Response: Appropriate  Type of Therapy: Process Group  Topic: Clinician checked in with clients, assessing for SI/HI/psychosis/substance use and overall level of functioning. Clinician and group members processed recent stressors, including relationships, contact with toxic people, and mixed, uncomfortable emotions as possible triggers for cravings and relapse. Clinician and group members processed stages of relapse (Emotions, thoughts, actions) and ways to interview. Clinician encouraged clients to be mindful of thoughts/feelings which come with identified triggers and attempt to intervene with alternative thoughts or distraction skills to avoid staying in uncomfortable emotions and developing cravings.   Group Time: 2:30pm-4pm  Participation Level: Active  Behavioral Response: Appropriate  Type of Therapy: Psycho-education Group  Topic: Psycho-educational group focused on healthy lifestyle. Go go-facilitated by staff from [facility]. Staff provided information of ways to improve exercise, food, sleep hygiene, and overall wellness.   Summary: Client denies self-sabotaging behaviors despite clinician developing discrepancies of client not having step down plans in place for after discharging from IOP and previously reporting being at home alone, bored is a trigger for use. Client identified triggers for cravings previously however continues to minimize the severity of possible relapse with no plan in place. Client reports again he is in the program willingly which shows changes of previous discharges from residential.   Family Program: Family present? No   Name of family member(s): n/a  UDS collected: No Results: cannabis from UDS on monday  AA/NA attended?: No  Sponsor?: No   Karissa A Brone, LCSW, LCAS

## 2018-08-17 ENCOUNTER — Other Ambulatory Visit (HOSPITAL_BASED_OUTPATIENT_CLINIC_OR_DEPARTMENT_OTHER): Payer: BC Managed Care – PPO | Admitting: Licensed Clinical Social Worker

## 2018-08-17 ENCOUNTER — Telehealth (HOSPITAL_COMMUNITY): Payer: Self-pay | Admitting: Licensed Clinical Social Worker

## 2018-08-17 DIAGNOSIS — F1021 Alcohol dependence, in remission: Secondary | ICD-10-CM

## 2018-08-17 DIAGNOSIS — Z811 Family history of alcohol abuse and dependence: Secondary | ICD-10-CM

## 2018-08-17 DIAGNOSIS — F1220 Cannabis dependence, uncomplicated: Secondary | ICD-10-CM | POA: Diagnosis not present

## 2018-08-17 DIAGNOSIS — Z9119 Patient's noncompliance with other medical treatment and regimen: Secondary | ICD-10-CM

## 2018-08-17 DIAGNOSIS — I10 Essential (primary) hypertension: Secondary | ICD-10-CM | POA: Diagnosis not present

## 2018-08-17 DIAGNOSIS — B001 Herpesviral vesicular dermatitis: Secondary | ICD-10-CM

## 2018-08-17 DIAGNOSIS — F3175 Bipolar disorder, in partial remission, most recent episode depressed: Secondary | ICD-10-CM

## 2018-08-17 NOTE — Progress Notes (Signed)
Patient ID: Thomas Williamson, male   DOB: Oct 02, 1975, 43 y.o.   MRN: [id number]                                                                                                                                                                                                                                                                             [facility] Outpatient                                                                                                                    CD-IOP DISCHARGE NOTE   Date of Admission: 07/13/2018 [facility] Provider: [place] [facility] Date of Discharge: 08/17/2018 Sobriety Date: Still smoking pot/ reports last ETOH use 06/07/18  Admission Diagnosis: CM     1. Alcohol use disorder, severe, in early remission ([place])  F10.21       2. Cannabis dependence ([place])  F12.20       3. Bipolar disorder, in partial remission, most recent episode hypomanic ([place])  F31.71        4. Hypertension, unspecified type  F110          5. 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      He is under care of Dr Montel Culver for his diagnosis of Bipolar DO  UDS + THC  PDMP Clear  Medications: AmLODipine 5 MG tablet  Commonly known as: NORVASC  Baclofen 10 MG tablet  Commonly known as: LIORESAL  Take  1 tablet (10 mg total) by mouth 3 (three) times daily.  BuPROPion 300 MG 24 hr tablet  Commonly known as: WELLBUTRIN XL  Take 1 tablet (300 mg total) by mouth daily.  Doxepin 50 MG capsule  Commonly known as: SINEQUAN  Take 1 capsule (50 mg total) by mouth at bedtime.  Lisinopril 20 MG tablet  Commonly known as: ZESTRIL  Lithium carbonate 300 MG CR tablet  Commonly known as: LITHOBID  Take 2 tablets (600 mg total) by mouth at bedtime.  Losartan 100 MG tablet  Commonly known as: COZAAR  Take 1 tablet (100 mg total) by mouth daily.  Naltrexone 50 MG tablet  Commonly known as: DEPADE  Take 1 tablet (50 mg total) by mouth daily.  OLANZapine 5 MG tablet  Commonly known as: ZYPREXA  Take 1 tablet (5 mg total) by mouth at bedtime.   Omeprazole 20 MG capsule  Commonly known as: PRILOSEC  Take 20 mg by mouth daily.  Pravastatin 10 MG tablet  Commonly known as: PRAVACHOL  Take 1 tablet (10 mg total) by mouth daily.  Propranolol 10 MG tablet  Commonly known as: INDERAL  ValACYclovir 1000 MG tablet  Commonly known as: VALTREX  TAKE 1 TABLET (1,000 MG TOTAL) BY MOUTH 2 (TWO) TIMES DAILY.  Discharge Diagnosis:  0 Noncompliance with treatment plan 0 Cannabis dependence ([place]) 0 Alcohol use disorder, severe, in early remission ([place]) 0 Family history of alcoholism 0 Bipolar disorder, in partial remission, most recent episode depressed (HCC) 0 Recurrent cold sores 0 Hypertension   Plan of Action to Address Continuing Problems:  Goals and Activities to Help Maintain Sobriety: 1. Stay away from people ,places and things that are triggers 2. Continue practicing Fair Fighting rules in interpersonal conflicts. 3. Continue alcohol and drug refusal skills and call on support systems 4. Attend AA/NA meetings AT LEAST as often as you use  5. Obtain a sponsor and a home group in [place]. 6. Return to individual counseling and Dr Montel Culver as scheduled  Referrals: [facility]   Next appointment: 1 week with L. McKenzie LCAS Prognosis:    Client has NOT participated in the development of this discharge plan and has NOT received a copy of this completed plan

## 2018-08-18 ENCOUNTER — Other Ambulatory Visit (HOSPITAL_COMMUNITY): Payer: BC Managed Care – PPO

## 2018-08-18 ENCOUNTER — Encounter (HOSPITAL_COMMUNITY): Payer: Self-pay | Admitting: Medical

## 2018-08-19 ENCOUNTER — Telehealth: Payer: Self-pay | Admitting: Cardiology

## 2018-08-19 NOTE — Telephone Encounter (Signed)
New Message ° ° ° °Left message to confirm appt and answer covid questions  °

## 2018-08-22 ENCOUNTER — Encounter: Payer: Self-pay | Admitting: Cardiology

## 2018-08-22 ENCOUNTER — Encounter: Payer: Self-pay | Admitting: *Deleted

## 2018-08-22 ENCOUNTER — Other Ambulatory Visit (HOSPITAL_COMMUNITY): Payer: BC Managed Care – PPO

## 2018-08-22 ENCOUNTER — Ambulatory Visit: Payer: BC Managed Care – PPO | Admitting: Cardiology

## 2018-08-22 ENCOUNTER — Encounter (INDEPENDENT_AMBULATORY_CARE_PROVIDER_SITE_OTHER): Payer: Self-pay

## 2018-08-22 ENCOUNTER — Other Ambulatory Visit (HOSPITAL_COMMUNITY): Payer: Self-pay | Admitting: Psychiatry

## 2018-08-22 ENCOUNTER — Other Ambulatory Visit: Payer: Self-pay

## 2018-08-22 VITALS — BP 104/80 | HR 90 | Ht 70.0 in | Wt 203.4 lb

## 2018-08-22 DIAGNOSIS — R9431 Abnormal electrocardiogram [ECG] [EKG]: Secondary | ICD-10-CM

## 2018-08-22 DIAGNOSIS — I10 Essential (primary) hypertension: Secondary | ICD-10-CM

## 2018-08-22 DIAGNOSIS — I447 Left bundle-branch block, unspecified: Secondary | ICD-10-CM | POA: Diagnosis not present

## 2018-08-22 NOTE — Patient Instructions (Signed)
Medication Instructions:  The current medical regimen is effective;  continue present plan and medications.  If you need a refill on your cardiac medications before your next appointment, please call your pharmacy.   Testing/Procedures: Your physician has requested that you have an echocardiogram. Echocardiography is a painless test that uses sound waves to create images of your heart. It provides your doctor with information about the size and shape of your heart and how well your heart's chambers and valves are working. This procedure takes approximately one hour. There are no restrictions for this procedure.  Your physician has requested that you have a lexiscan myoview. For further information please visit [email or web address]. Please follow instruction sheet, as given.  Follow-Up: Follow up as needed with Dr Marlou Porch.  Thank you for choosing [facility]!!

## 2018-08-22 NOTE — Telephone Encounter (Signed)

## 2018-08-22 NOTE — Progress Notes (Signed)
Cardiology Office Note:    Date:  08/22/2018   ID:  Thomas Williamson, DOB 11/13/1975, MRN [id number]  PCP:  Lucretia Kern, DO  Cardiologist:  Candee Furbish, MD  Electrophysiologist:  None   Referring MD: Lucretia Kern, DO     History of Present Illness:    Thomas Williamson is a 43 y.o. male with alcohol use depression anxiety with behavioral health visits noted in epic hospitalized in 62 month in [name] for alcohol use.  While he was in the hospital there were issues with his blood pressure and elevated heart rate.  They added lisinopril Norvasc and propranolol.  Heart rate 92 with blood pressure 130/70.  Feels well.  Liver and kidney function was normal.  His mother had atrial fibrillation and high blood pressure.  ECG showed left bundle branch block heart rate 90.  Syncope 10 years ago. Funny gulp and fell into soup. None since.   Denies any chest pain, no significant shortness of breath with activity.  No orthopnea.  Currently in remission, alcohol use.  Past Medical History:  Diagnosis Date  . Alcohol use disorder, severe, dependence ([place]) 07/11/2018  . Alcohol use disorder, severe, in early remission ([place]) 03/30/2018  . Allergy   . Anxiety   . Bipolar disorder ([place])   . Bipolar disorder, in partial remission, most recent episode hypomanic ([place]) 03/30/2018  . Bleeding internal hemorrhoids 09/12/2013  . Cannabis dependence ([place]) 07/13/2018   Daily use  . Cervical disc disease 11/20/2014  . Chronic anxiety 07/13/2018  . Cough 01/07/2015  . Depression   . Folliculitis 67/09/9379  . GERD (gastroesophageal reflux disease)   . Hernia, inguinal, bilateral 02/24/2011  . High ankle sprain of right lower extremity 01/16/2015  . Hx of hepatitis C 10/05/2017   -treated in 2019 -hepatology recommended no further surveillance needed except for LFTs with labs and see hepatologist if elevated  . Hypertension   . IBS (irritable bowel syndrome)   . Lipids abnormal 09/29/2013  . PONV (postoperative nausea  and vomiting)   . Uvulitis 11/02/2012  . Viral gastroenteritis 06/26/2014  . Viral URI with cough 02/10/2016    Past Surgical History:  Procedure Laterality Date  . ANTERIOR CRUCIATE LIGAMENT REPAIR  2010  . INGUINAL HERNIA REPAIR  02/23/2012   Procedure: LAPAROSCOPIC BILATERAL INGUINAL HERNIA REPAIR;  Surgeon: Shann Medal, MD;  Location: [facility];  Service: General;  Laterality: Bilateral;  Laparoscopic Bilateral Inguinal Hernia Repair with mesh  . INSERTION OF MESH  02/23/2012   Procedure: INSERTION OF MESH;  Surgeon: Shann Medal, MD;  Location: [facility];  Service: General;  Laterality: N/A;  . NOSE SURGERY  0175,1025    Current Medications: Current Meds  Medication Sig  . amLODipine (NORVASC) 5 MG tablet Take 5 mg by mouth daily.   . baclofen (LIORESAL) 10 MG tablet Take 1 tablet (10 mg total) by mouth 3 (three) times daily.  [place] Kitchen buPROPion (WELLBUTRIN XL) 300 MG 24 hr tablet Take 1 tablet (300 mg total) by mouth daily.  [place] Kitchen doxepin (SINEQUAN) 50 MG capsule Take 1 capsule (50 mg total) by mouth at bedtime.  Mar[place] Kitchen lithium carbonate (LITHOBID) 300 MG CR tablet Take 2 tablets (600 mg total) by mouth at bedtime.  Mar[place] Kitchen losartan (COZAAR) 100 MG tablet Take 1 tablet (100 mg total) by mouth daily.  Mar[place] Kitchen OLANZapine (ZYPREXA) 5 MG tablet Take 1 tablet (5 mg total) by mouth at bedtime.  [place] Kitchen omeprazole (PRILOSEC) 20 MG capsule Take 20 mg by  mouth daily.  . pravastatin (PRAVACHOL) 10 MG tablet Take 1 tablet (10 mg total) by mouth daily.  . propranolol (INDERAL) 10 MG tablet Take 10 mg by mouth daily.      Allergies:   Hydrocodone   Social History   Socioeconomic History  . Marital status: Single    Spouse name: Not on file  . Number of children: 0  . Years of education: Not on file  . Highest education level: Bachelor's degree (e.g., BA, AB, BS)  Occupational History  . Not on file  Social Needs  . Financial resource strain: Somewhat hard  . Food insecurity    Worry: Sometimes true    Inability:  Sometimes true  . Transportation needs    Medical: No    Non-medical: No  Tobacco Use  . Smoking status: Never Smoker  . Smokeless tobacco: Never Used  Substance and Sexual Activity  . Alcohol use: Not Currently    Alcohol/week: 8.0 standard drinks    Types: 8 Shots of liquor per week  . Drug use: Yes    Types: Marijuana    Comment: week ago  . Sexual activity: Not Currently  Lifestyle  . Physical activity    Days per week: 0 days    Minutes per session: 0 min  . Stress: Very much  Relationships  . Social Herbalist on phone: More than three times a week    Gets together: Twice a week    Attends religious service: Never    Active member of club or organization: Yes    Attends meetings of clubs or organizations: More than 4 times per year    Relationship status: Never married  Other Topics Concern  . 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  ROS:   Please see the history of present illness.     All other systems reviewed and are negative.  EKGs/Labs/Other Studies Reviewed:    The following studies were reviewed today: EKG  EKG:  EKG is  ordered today.  The ekg ordered today demonstrates sinus rhythm 90 left bundle branch block  Recent Labs: 02/17/2018: ALT 15; BUN 15; Creatinine, Ser 0.94; Hemoglobin 13.3; Platelets 305.0; Potassium 3.9; Sodium 138  Recent Lipid Panel    Component Value Date/Time   CHOL 208 (H) 03/31/2016 1049   TRIG 252.0 (H) 03/31/2016 1049   HDL 45.10 03/31/2016 1049   CHOLHDL 5 03/31/2016 1049   VLDL 50.4 (H) 03/31/2016 1049   LDLDIRECT 102.0 03/31/2016 1049    Physical Exam:    VS:  BP 104/80   Pulse  90   Ht 5\' 10"  (1.778 m)   Wt 203 lb 6.4 oz (92.3 kg)   SpO2 96%   BMI 29.18 kg/m     Wt Readings from Last 3 Encounters:  08/22/18 203 lb 6.4 oz (92.3 kg)  02/17/18 197 lb 1.6 oz (89.4 kg)  02/23/17 181 lb 12.8 oz (82.5 kg)     GEN:  Well nourished, well developed in no acute distress HEENT: Normal NECK: No JVD; No carotid  bruits LYMPHATICS: No lymphadenopathy CARDIAC: RRR, no murmurs, rubs, gallops RESPIRATORY:  Clear to auscultation without rales, wheezing or rhonchi  ABDOMEN: Soft, non-tender, non-distended MUSCULOSKELETAL:  No edema; No deformity  SKIN: Warm and dry NEUROLOGIC:  Alert and oriented x 3 PSYCHIATRIC:  Normal affect   ASSESSMENT:    1. Left bundle branch block   2. Abnormal electrocardiogram   3. Essential hypertension    PLAN:    In order of problems listed above:  Tachycardia/essential hypertension in the setting of alcohol withdrawal/treatment/left bundle branch block -His tachycardia and hypertension go hand-in-hand with lack of alcohol during his treatment.  I agree with current plan.  Adjust medications as necessary.  We will check an echocardiogram to ensure proper structure and function of his heart and make sure he does not have an alcohol induced cardiomyopathy. -I will also check a pharmacologic stress test given his underlying left bundle branch block to make sure there is no associated coronary artery disease. - On lithium 450 AM, 350 at night. -I will check an echocardiogram to ensure proper structure and function given his underlying left bundle branch block.  I will follow-up with studies.   Medication Adjustments/Labs and Tests Ordered: Current medicines are reviewed at length with the patient today.  Concerns regarding medicines are outlined above.  Orders Placed This Encounter  Procedures  . MYOCARDIAL PERFUSION IMAGING  . EKG 12-Lead  . ECHOCARDIOGRAM COMPLETE   No orders of the defined types were placed in this encounter.    Patient Instructions  Medication Instructions:  The current medical regimen is effective;  continue present plan and medications.  If you need a refill on your cardiac medications before your next appointment, please call your pharmacy.   Testing/Procedures: Your physician has requested that you have an echocardiogram. Echocardiography is a painless test that uses sound waves to create images of your heart. It provides your doctor with information about the size and shape of your heart and how well your heart's chambers and valves are working. This procedure takes approximately one hour. There are no restrictions for this procedure.  Your physician has requested that you have a lexiscan myoview. For further information please visit [email or web address]. Please follow instruction sheet, as given.  Follow-Up: Follow up as needed with Dr Marlou Porch.  Thank you for choosing [facility]!!         Signed, Candee Furbish, MD  08/22/2018 2:57 PM    [facility]

## 2018-08-24 ENCOUNTER — Encounter (HOSPITAL_COMMUNITY): Payer: Self-pay | Admitting: Licensed Clinical Social Worker

## 2018-08-24 ENCOUNTER — Ambulatory Visit (INDEPENDENT_AMBULATORY_CARE_PROVIDER_SITE_OTHER): Payer: BC Managed Care – PPO | Admitting: Licensed Clinical Social Worker

## 2018-08-24 ENCOUNTER — Other Ambulatory Visit (HOSPITAL_COMMUNITY): Payer: BC Managed Care – PPO

## 2018-08-24 ENCOUNTER — Other Ambulatory Visit: Payer: Self-pay

## 2018-08-24 DIAGNOSIS — F1220 Cannabis dependence, uncomplicated: Secondary | ICD-10-CM | POA: Diagnosis not present

## 2018-08-24 DIAGNOSIS — F1020 Alcohol dependence, uncomplicated: Secondary | ICD-10-CM

## 2018-08-24 NOTE — Progress Notes (Signed)
Virtual Visit via Video Note  I connected with Thomas Williamson on 08/24/18 at 11:00 AM EDT by a video enabled telemedicine application and verified that I am speaking with the correct person using two identifiers.   I discussed the limitations of evaluation and management by telemedicine and the availability of in person appointments. The patient expressed understanding and agreed to proceed.  History of Present Illness: Pt is referred to individual therapy after completion of CD-IOP for alcoholism. Pt has previously been to rehab facilities ([place] and [name]).    Observations/Objective: Pt presented for his webex session. Spent a considerable amount of the session building a trusting therapeutic relationship and gaining background information. 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 Discussed goals for his recovery: AA meetings (5 per week), get a sponsor, read the big book, work the steps. Work a Environmental education officer.  Assessment and Plan: Review tx plan from CD-IOP   Follow Up Instructions:    I discussed the assessment and treatment plan with the patient. The patient was provided an opportunity to ask questions and all were answered. The patient agreed with the plan and demonstrated an understanding of the instructions.   The patient was advised to call back or seek an in-person evaluation if the symptoms worsen or if the condition fails to improve as anticipated.  I provided 60 minutes of non-face-to-face time during this encounter.   MACKENZIE,LISBETH S, LCAS

## 2018-08-25 ENCOUNTER — Other Ambulatory Visit (HOSPITAL_COMMUNITY): Payer: BC Managed Care – PPO

## 2018-08-26 ENCOUNTER — Other Ambulatory Visit: Payer: Self-pay

## 2018-08-26 ENCOUNTER — Ambulatory Visit (INDEPENDENT_AMBULATORY_CARE_PROVIDER_SITE_OTHER): Payer: BC Managed Care – PPO | Admitting: Family Medicine

## 2018-08-26 ENCOUNTER — Encounter: Payer: Self-pay | Admitting: Family Medicine

## 2018-08-26 DIAGNOSIS — M25471 Effusion, right ankle: Secondary | ICD-10-CM

## 2018-08-26 DIAGNOSIS — I10 Essential (primary) hypertension: Secondary | ICD-10-CM

## 2018-08-26 DIAGNOSIS — M25472 Effusion, left ankle: Secondary | ICD-10-CM | POA: Diagnosis not present

## 2018-08-26 NOTE — Progress Notes (Signed)
Subjective:    Patient ID: Thomas Williamson, male    DOB: 1975/10/30, 43 y.o.   MRN: [id number]  HPI Virtual Visit via Video Note  I connected with the patient on 08/26/18 at  1:30 PM EDT by a video enabled telemedicine application and verified that I am speaking with the correct person using two identifiers.  Location patient: home Location provider:work or home office Persons participating in the virtual visit: patient, provider  I discussed the limitations of evaluation and management by telemedicine and the availability of in person appointments. The patient expressed understanding and agreed to proceed.   HPI: Here to discuss swelling in both feet and ankles that started about a month ago. He has never had this before. He feels tightness in the feet but not true pain. No chest pain or SOB. He notices this primarily when he is up on his feet for prolonged periods, such as working a shift at [employer]. The swelling goes down after he gets off his feet. He recently returned from an alcohol rehab facility in [name], and while there his BP and heart rates were high. He was taken off HCTZ and was started on Amlodipine about 6 weeks ago. Since then his BP has been normal or even low. He saw Dr. Marlou Porch on 08-24-18 and his BP was 104/80. He had an otherwise normal exam that day, and he is scheduled for an ECHO on 09-05-18. The last creatinine I see in his chart was normal in January.   ROS: See pertinent positives and negatives per HPI.  Past Medical History:  Diagnosis Date  . Alcohol use disorder, severe, dependence ([place]) 07/11/2018  . Alcohol use disorder, severe, in early remission ([place]) 03/30/2018  . Allergy   . Anxiety   . Bipolar disorder ([place])   . Bipolar disorder, in partial remission, most recent episode hypomanic ([place]) 03/30/2018  . Bleeding internal hemorrhoids 09/12/2013  . Cannabis dependence ([place]) 07/13/2018   Daily use  . Cervical disc disease 11/20/2014  . Chronic anxiety  07/13/2018  . Cough 01/07/2015  . Depression   . Folliculitis 65/08/8467  . GERD (gastroesophageal reflux disease)   . Hernia, inguinal, bilateral 02/24/2011  . High ankle sprain of right lower extremity 01/16/2015  . Hx of hepatitis C 10/05/2017   -treated in 2019 -hepatology recommended no further surveillance needed except for LFTs with labs and see hepatologist if elevated  . Hypertension   . IBS (irritable bowel syndrome)   . Lipids abnormal 09/29/2013  . PONV (postoperative nausea and vomiting)   . Uvulitis 11/02/2012  . Viral gastroenteritis 06/26/2014  . Viral URI with cough 02/10/2016    Past Surgical History:  Procedure Laterality Date  . ANTERIOR CRUCIATE LIGAMENT REPAIR  2010  . INGUINAL HERNIA REPAIR  02/23/2012   Procedure: LAPAROSCOPIC BILATERAL INGUINAL HERNIA REPAIR;  Surgeon: Shann Medal, MD;  [facility];  Service: General;  Laterality: Bilateral;  Laparoscopic Bilateral Inguinal Hernia Repair with mesh  . INSERTION OF MESH  02/23/2012   Procedure: INSERTION OF MESH;  Surgeon: Shann Medal, MD;  [facility]: [facility];  Service: General;  Laterality: N/A;  . NOSE SURGERY  2000,2012    Family History  Problem Relation Age of Onset  . Hypertension Mother   . Atrial fibrillation Mother   . Hypertension Father      Current Outpatient Medications:  .  amLODipine (NORVASC) 5 MG tablet, Take 5 mg by mouth daily. , Disp: , Rfl:  .  baclofen (LIORESAL) 10 MG tablet, Take 1 tablet (10 mg total) by mouth 3 (three) times daily., Disp: 90 tablet, Rfl: 1 .  buPROPion (WELLBUTRIN XL) 300 MG 24 hr tablet, Take 1 tablet (300 mg total) by mouth daily., Disp: 30 tablet, Rfl: 2 .  doxepin (SINEQUAN) 50 MG capsule, Take 1 capsule (50 mg total) by mouth at bedtime., Disp: 90 capsule, Rfl: 0 .  lithium carbonate (LITHOBID) 300 MG CR tablet, Take 2 tablets (600 mg total) by mouth at bedtime., Disp: 60 tablet, Rfl: 2 .  losartan (COZAAR) 100 MG tablet, Take 1 tablet (100 mg total) by mouth  daily., Disp: 90 tablet, Rfl: 0 .  OLANZapine (ZYPREXA) 5 MG tablet, Take 1 tablet (5 mg total) by mouth at bedtime., Disp: 90 tablet, Rfl: 0 .  omeprazole (PRILOSEC) 20 MG capsule, Take 20 mg by mouth daily., Disp: , Rfl:  .  pravastatin (PRAVACHOL) 10 MG tablet, Take 1 tablet (10 mg total) by mouth daily., Disp: 90 tablet, Rfl: 0 .  propranolol (INDERAL) 10 MG tablet, Take 10 mg by mouth daily. , Disp: , Rfl:   EXAM:  VITALS per patient if applicable:  GENERAL: alert, oriented, appears well and in no acute distress  HEENT: atraumatic, conjunttiva clear, no obvious abnormalities on inspection of external nose and ears  NECK: normal movements of the head and neck  LUNGS: on inspection no signs of respiratory distress, breathing rate appears normal, no obvious gross SOB, gasping or wheezing  CV: no obvious cyanosis  MS: moves all visible extremities without noticeable abnormality  PSYCH/NEURO: pleasant and cooperative, no obvious depression or anxiety, speech and thought processing grossly intact  ASSESSMENT AND PLAN: Ankle edema. This is almost certainly a side effect of the Amlodipine. We will stop this and keep his other meds the same. He has a BP cuff at home and I asked him to check his BP once daily for awhile. If the BP goes back up we can substitute another medication. He can wear compression stockings while he is on the job. He will follow up with Dr. Marlou Porch.  Alysia Penna, MD  Discussed the following assessment and plan:  No diagnosis found.     I discussed the assessment and treatment plan with the patient. The patient was provided an opportunity to ask questions and all were answered. The patient agreed with the plan and demonstrated an understanding of the instructions.   The patient was advised to call back or seek an in-person evaluation if the symptoms worsen or if the condition fails to improve as anticipated.     Review of Systems     Objective:   Physical  Exam        Assessment & Plan:

## 2018-08-26 NOTE — Telephone Encounter (Signed)
He has asked to switch from Dr. Maudie Mercury to me so he can be seen live in the office, and I accepted

## 2018-08-29 ENCOUNTER — Other Ambulatory Visit (HOSPITAL_COMMUNITY): Payer: Self-pay | Admitting: Psychiatry

## 2018-08-29 ENCOUNTER — Other Ambulatory Visit (HOSPITAL_COMMUNITY): Payer: BC Managed Care – PPO

## 2018-08-29 NOTE — Telephone Encounter (Signed)
Pt was called and verified the [facility] appointment with Dr. Martinique and pt seemed a little confused about what he had done and he stated that he made the appointment to make sure that he has a PCP and does not want to transfer with Dr. Sarajane Jews at this time.

## 2018-08-31 ENCOUNTER — Other Ambulatory Visit (HOSPITAL_COMMUNITY): Payer: BC Managed Care – PPO

## 2018-09-01 ENCOUNTER — Other Ambulatory Visit (HOSPITAL_COMMUNITY): Payer: BC Managed Care – PPO

## 2018-09-01 ENCOUNTER — Encounter (HOSPITAL_COMMUNITY): Payer: Self-pay | Admitting: Licensed Clinical Social Worker

## 2018-09-01 ENCOUNTER — Other Ambulatory Visit: Payer: Self-pay

## 2018-09-01 ENCOUNTER — Telehealth (HOSPITAL_COMMUNITY): Payer: Self-pay | Admitting: *Deleted

## 2018-09-01 ENCOUNTER — Ambulatory Visit (INDEPENDENT_AMBULATORY_CARE_PROVIDER_SITE_OTHER): Payer: BC Managed Care – PPO | Admitting: Licensed Clinical Social Worker

## 2018-09-01 DIAGNOSIS — F1020 Alcohol dependence, uncomplicated: Secondary | ICD-10-CM

## 2018-09-01 DIAGNOSIS — F1220 Cannabis dependence, uncomplicated: Secondary | ICD-10-CM

## 2018-09-01 NOTE — Progress Notes (Signed)
Virtual Visit via Video Note  I connected with Thomas Williamson on 09/01/18 at 10:00 AM EDT by a video enabled telemedicine application and verified that I am speaking with the correct person using two identifiers.   I discussed the limitations of evaluation and management by telemedicine and the availability of in person appointments. The patient expressed understanding and agreed to proceed.  History of Present Illness: Pt is referred to individual therapy after completion of CD-IOP for alcoholism. Pt has previously been to rehab facilities ([place] and [name]).    Observations/Objective: Pt presented for his webex session. Pt described his psychiatric symptoms and current life events. Pt reports he hasn't drank since last session (sobriety date 5/4), but he continues to use marijuana. Educated pt on addiction,  how each drug affects his brain psychoactively. Pt reports his parents (especially his father) enables his addiction. Coached pt on enabling behaviors. Educated pt on SMART goals, identifying goals with strategies for direction in his life. Coached pt on recovery: meetings, steps, spirituality, healthy eating, sleep hygiene, + people, exercise, sponsor.      Assessment and Plan: Review tx plan from CD-IOP   Follow Up Instructions:    I discussed the assessment and treatment plan with the patient. The patient was provided an opportunity to ask questions and all were answered. The patient agreed with the plan and demonstrated an understanding of the instructions.   The patient was advised to call back or seek an in-person evaluation if the symptoms worsen or if the condition fails to improve as anticipated.  I provided 60 minutes of non-face-to-face time during this encounter.   Saige Canton S, LCAS

## 2018-09-01 NOTE — Telephone Encounter (Signed)
Left message on voicemail in reference to upcoming appointment scheduled for 09/05/18. Phone number given for a call back so details instructions can be given. Thomas Williamson

## 2018-09-05 ENCOUNTER — Ambulatory Visit (HOSPITAL_COMMUNITY): Payer: BC Managed Care – PPO | Attending: Cardiology

## 2018-09-05 ENCOUNTER — Other Ambulatory Visit: Payer: Self-pay

## 2018-09-05 ENCOUNTER — Other Ambulatory Visit (HOSPITAL_COMMUNITY): Payer: BC Managed Care – PPO

## 2018-09-05 ENCOUNTER — Ambulatory Visit (HOSPITAL_BASED_OUTPATIENT_CLINIC_OR_DEPARTMENT_OTHER): Payer: BC Managed Care – PPO

## 2018-09-05 DIAGNOSIS — I10 Essential (primary) hypertension: Secondary | ICD-10-CM

## 2018-09-05 DIAGNOSIS — I447 Left bundle-branch block, unspecified: Secondary | ICD-10-CM | POA: Diagnosis not present

## 2018-09-05 DIAGNOSIS — R9431 Abnormal electrocardiogram [ECG] [EKG]: Secondary | ICD-10-CM | POA: Diagnosis not present

## 2018-09-05 LAB — MYOCARDIAL PERFUSION IMAGING
LV dias vol: 147 mL (ref 62–150)
LV sys vol: 93 mL
Peak HR: 86 {beats}/min
Rest HR: 59 {beats}/min
SDS: 4
SRS: 1
SSS: 5
TID: 1.05

## 2018-09-05 LAB — ECHOCARDIOGRAM COMPLETE
Height: 70 in
Weight: 3248 oz

## 2018-09-05 MED ORDER — REGADENOSON 0.4 MG/5ML IV SOLN
0.4000 mg | Freq: Once | INTRAVENOUS | Status: AC
  Administered 2018-09-05: 0.4 mg via INTRAVENOUS

## 2018-09-05 MED ORDER — TECHNETIUM TC 99M TETROFOSMIN IV KIT
32.6000 | PACK | Freq: Once | INTRAVENOUS | Status: AC | PRN
  Administered 2018-09-05: 32.6 via INTRAVENOUS
  Filled 2018-09-05: qty 33

## 2018-09-05 MED ORDER — TECHNETIUM TC 99M TETROFOSMIN IV KIT
10.2000 | PACK | Freq: Once | INTRAVENOUS | Status: AC | PRN
  Administered 2018-09-05: 10.2 via INTRAVENOUS
  Filled 2018-09-05: qty 11

## 2018-09-06 ENCOUNTER — Ambulatory Visit (HOSPITAL_COMMUNITY): Payer: BC Managed Care – PPO | Admitting: Licensed Clinical Social Worker

## 2018-09-06 ENCOUNTER — Telehealth: Payer: Self-pay | Admitting: *Deleted

## 2018-09-06 ENCOUNTER — Telehealth: Payer: Self-pay

## 2018-09-06 ENCOUNTER — Encounter (HOSPITAL_COMMUNITY): Payer: Self-pay | Admitting: Licensed Clinical Social Worker

## 2018-09-06 ENCOUNTER — Encounter: Payer: BC Managed Care – PPO | Admitting: Family Medicine

## 2018-09-06 NOTE — Telephone Encounter (Signed)
Called and left a message for patient to c/b to review results of echo and myoview stress test along with the orders below:  Echo: EF is low 30-35. Could be from ETOH use in the past.  If we are successful at beta blocking him and slowing his heart rate, we could consider a cardiac CT of cors in the future to exclude CAD.   Let's stop the Inderal 10mg  and start metoprolol XL 100mg  PO QD.  Have him follow up in 2-4 weeks with me or APP.   Lexiscan Myoview: No obvious ischemia or infarct  Low EF  ECHO 35%  See ECHO results.   Starting toprol   Candee Furbish, MD   Will continue to attempt to contact pt by phone.

## 2018-09-06 NOTE — Telephone Encounter (Signed)
Copied from [place] [id number]. Topic: General - Other >> Sep 06, 2018  9:20 AM Valla Leaver wrote: Reason for CRM: Patient calling stating that Dr. Sharlene Motts told him he would take hm as a transfer of care after his appt with him on 08/26/18. Please confirm with Dr. Sharlene Motts

## 2018-09-07 ENCOUNTER — Ambulatory Visit (INDEPENDENT_AMBULATORY_CARE_PROVIDER_SITE_OTHER): Payer: BC Managed Care – PPO | Admitting: Licensed Clinical Social Worker

## 2018-09-07 ENCOUNTER — Encounter (HOSPITAL_COMMUNITY): Payer: Self-pay | Admitting: Licensed Clinical Social Worker

## 2018-09-07 ENCOUNTER — Other Ambulatory Visit (HOSPITAL_COMMUNITY): Payer: BC Managed Care – PPO

## 2018-09-07 ENCOUNTER — Other Ambulatory Visit: Payer: Self-pay

## 2018-09-07 DIAGNOSIS — F12288 Cannabis dependence with other cannabis-induced disorder: Secondary | ICD-10-CM

## 2018-09-07 DIAGNOSIS — F1020 Alcohol dependence, uncomplicated: Secondary | ICD-10-CM

## 2018-09-07 DIAGNOSIS — R454 Irritability and anger: Secondary | ICD-10-CM

## 2018-09-07 DIAGNOSIS — F1220 Cannabis dependence, uncomplicated: Secondary | ICD-10-CM

## 2018-09-07 MED ORDER — METOPROLOL SUCCINATE ER 100 MG PO TB24: 100.0000 mg | ORAL_TABLET | Freq: Every day | ORAL | 3 refills | Status: DC

## 2018-09-07 NOTE — Telephone Encounter (Signed)
Yes I agreed to see him  

## 2018-09-07 NOTE — Telephone Encounter (Signed)
Appointment has been scheduled. Nothing further needed.

## 2018-09-07 NOTE — Progress Notes (Signed)
Virtual Visit via Video Note  I connected with Thomas Williamson on 09/07/18 at  3:30 PM EDT by a video enabled telemedicine application and verified that I am speaking with the correct person using two identifiers.   I discussed the limitations of evaluation and management by telemedicine and the availability of in person appointments. The patient expressed understanding and agreed to proceed.  History of Present Illness: Pt is referred to individual therapy after completion of CD-IOP for alcoholism. Pt has previously been to rehab facilities ([place] and [name]).    Observations/Objective: Pt presented irritated for his webex session. Pt described his psychiatric symptoms and current life events. Pt reports he hasn't drank since last session (sobriety date 5/4), but he continues to use marijuana. Sent pt an article on the effects of marijuana on the heart. Asked open ended questions about his irritability. Coached pt on extreme moods in recovery, extreme moods are dangerous in early recovery. Pt is angry at himself for how his life has turned out. He is angry with his father because "he's always checking on me." Processed his anger. Referred pt to [facility] for anger management classes. Pt still has not gone to meetings. Discussed recovery is more than not drinking. Discussed SMART goals for his future.         Assessment and Plan: Review tx plan from CD-IOP   Follow Up Instructions:    I discussed the assessment and treatment plan with the patient. The patient was provided an opportunity to ask questions and all were answered. The patient agreed with the plan and demonstrated an understanding of the instructions.   The patient was advised to call back or seek an in-person evaluation if the symptoms worsen or if the condition fails to improve as anticipated.  I provided 60 minutes of non-face-to-face time during this encounter.   MACKENZIE,LISBETH S, LCAS

## 2018-09-07 NOTE — Telephone Encounter (Signed)
Reviewed test results with patient in detail.  He is aware to d/c propranolol and start Metoprolol XL 100 mg daily.   RX will be sent into [facility] for #90 as requested. F/u appt scheduled for 09/30/2018.

## 2018-09-08 ENCOUNTER — Other Ambulatory Visit (HOSPITAL_COMMUNITY): Payer: BC Managed Care – PPO

## 2018-09-12 ENCOUNTER — Other Ambulatory Visit (HOSPITAL_COMMUNITY): Payer: BC Managed Care – PPO

## 2018-09-13 ENCOUNTER — Ambulatory Visit: Payer: BC Managed Care – PPO | Admitting: Family Medicine

## 2018-09-16 ENCOUNTER — Ambulatory Visit (INDEPENDENT_AMBULATORY_CARE_PROVIDER_SITE_OTHER): Payer: BC Managed Care – PPO | Admitting: Family Medicine

## 2018-09-16 ENCOUNTER — Encounter: Payer: Self-pay | Admitting: Family Medicine

## 2018-09-16 ENCOUNTER — Other Ambulatory Visit: Payer: Self-pay

## 2018-09-16 VITALS — BP 140/90 | HR 81 | Temp 98.7°F | Wt 205.2 lb

## 2018-09-16 DIAGNOSIS — I10 Essential (primary) hypertension: Secondary | ICD-10-CM | POA: Diagnosis not present

## 2018-09-16 DIAGNOSIS — I426 Alcoholic cardiomyopathy: Secondary | ICD-10-CM | POA: Diagnosis not present

## 2018-09-16 DIAGNOSIS — F3171 Bipolar disorder, in partial remission, most recent episode hypomanic: Secondary | ICD-10-CM

## 2018-09-16 DIAGNOSIS — B001 Herpesviral vesicular dermatitis: Secondary | ICD-10-CM | POA: Diagnosis not present

## 2018-09-16 DIAGNOSIS — I447 Left bundle-branch block, unspecified: Secondary | ICD-10-CM | POA: Insufficient documentation

## 2018-09-16 DIAGNOSIS — F1021 Alcohol dependence, in remission: Secondary | ICD-10-CM

## 2018-09-16 DIAGNOSIS — E785 Hyperlipidemia, unspecified: Secondary | ICD-10-CM | POA: Diagnosis not present

## 2018-09-16 MED ORDER — VALACYCLOVIR HCL 1 G PO TABS: 1000.0000 mg | ORAL_TABLET | Freq: Two times a day (BID) | ORAL | 5 refills | Status: DC | PRN

## 2018-09-16 MED ORDER — HYDROCHLOROTHIAZIDE 25 MG PO TABS: 25.0000 mg | ORAL_TABLET | Freq: Every day | ORAL | 3 refills | Status: DC

## 2018-09-16 NOTE — Progress Notes (Signed)
Subjective:    Patient ID: Thomas Williamson, male    DOB: 02/13/1975, 43 y.o.   MRN: [id number]  HPI Here to establish with [name]. He is transfering care from Dr. Maudie Mercury. He is doing fairly well in general today, but he has a few issues. He saw Dr. Candee Furbish in July for HTN, and his medications were adjusted. He had an ECHO which showed systolic dysfunction and an EF of 30-35%. This was felt to be the result of alcohol abuse. He also had a Myoview stress test and this showed no signs of ischemia. His BP had been well controlled, then I had a virtual visit with him recently for swelling in the ankles. I felt this was a side effect of the Amlodipine, so I asked him to stop this. Now the ankle swelling is almost gone, but hsi BP has been creeping back up a bit. He denies any chest pain or SOB. He also asks for refills for Valtrex, which he takes as needed for fever blisters.   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 His psychiatrist is Dr. Maudry Mayhew. He spent a month in a rehab program in [name] earlier this year, and now he attends an IOP program. He has not been to an [facility] since coming back to [place], and he does not yet have a sponsor here. He is currently working as a host at [employer], and he is averaging 30 hours a week. 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    Review of Systems  Constitutional: Negative.   Respiratory: Negative.   Cardiovascular: Negative.   Gastrointestinal: Negative.   Genitourinary: Negative.   Neurological: Negative.        Objective:   Physical Exam Constitutional:      Appearance: Normal appearance.  Cardiovascular:     Rate and Rhythm: Normal rate and regular rhythm.     Pulses: Normal pulses.     Heart sounds: Normal heart  sounds.  Pulmonary:     Effort: Pulmonary effort is normal.     Breath sounds: Normal breath sounds.  Musculoskeletal:     Right lower leg: No edema.     Left lower leg: No edema.  Neurological:     General: No focal deficit present.     Mental Status: He is alert and oriented to person, place, and time.  Psychiatric:        Mood and Affect: Mood normal.        Behavior: Behavior normal.        Thought Content: Thought content normal.        Judgment: Judgment normal.           Assessment & Plan:  He has HTN, and we will add HCTZ 25 mg daily to his current regimen. He has a BP cuff at home and I asked him to check this several times a week. He should exercise and try to lose a little weight. He is doing well in his recovery program, and I urged him to attend AA meetings and to find a sponsor. He has evidence of a cardiomyopathy, likely from alcohol abuse, and I told him that this may well improve over time. I refilled the Valtrex. We will set up fasting  labs next week to check a BMET and a lipid panel.  Alysia Penna, MD

## 2018-09-19 ENCOUNTER — Telehealth (HOSPITAL_COMMUNITY): Payer: Self-pay | Admitting: Licensed Clinical Social Worker

## 2018-09-19 ENCOUNTER — Other Ambulatory Visit: Payer: Self-pay

## 2018-09-19 ENCOUNTER — Other Ambulatory Visit (INDEPENDENT_AMBULATORY_CARE_PROVIDER_SITE_OTHER): Payer: BC Managed Care – PPO

## 2018-09-19 DIAGNOSIS — I10 Essential (primary) hypertension: Secondary | ICD-10-CM | POA: Diagnosis not present

## 2018-09-19 LAB — BASIC METABOLIC PANEL
BUN: 13 mg/dL (ref 6–23)
CO2: 29 mEq/L (ref 19–32)
Calcium: 9.7 mg/dL (ref 8.4–10.5)
Chloride: 102 mEq/L (ref 96–112)
Creatinine, Ser: 1.03 mg/dL (ref 0.40–1.50)
GFR: 78.7 mL/min (ref >60.00)
Glucose, Bld: 84 mg/dL (ref 70–99)
Potassium: 4.5 mEq/L (ref 3.5–5.1)
Sodium: 140 mEq/L (ref 135–145)

## 2018-09-19 LAB — HEPATIC FUNCTION PANEL
ALT: 22 U/L (ref 0–53)
AST: 16 U/L (ref 0–37)
Albumin: 4.4 g/dL (ref 3.5–5.2)
Alkaline Phosphatase: 115 U/L (ref 39–117)
Bilirubin, Direct: 0.1 mg/dL (ref 0.0–0.3)
Total Bilirubin: 0.5 mg/dL (ref 0.2–1.2)
Total Protein: 7.2 g/dL (ref 6.0–8.3)

## 2018-09-19 LAB — LIPID PANEL
Cholesterol: 241 mg/dL — ABNORMAL HIGH (ref 0–200)
HDL: 47 mg/dL (ref >39.00)
NonHDL: 193.66
Total CHOL/HDL Ratio: 5
Triglycerides: 316 mg/dL — ABNORMAL HIGH (ref 0.0–149.0)
VLDL: 63.2 mg/dL — ABNORMAL HIGH (ref 0.0–40.0)

## 2018-09-19 LAB — LDL CHOLESTEROL, DIRECT: Direct LDL: 157 mg/dL

## 2018-09-19 NOTE — Telephone Encounter (Signed)
Patient called the front desk asking for a new appointment. Called patient and left vm about a new appointment date/time. Patient never called the front desk to confirm the appointmnet. Called again today & left msg for pt to call front desk to schedule.  Alver Fisher, LCAS

## 2018-09-21 ENCOUNTER — Ambulatory Visit (HOSPITAL_COMMUNITY): Payer: BC Managed Care – PPO | Admitting: Licensed Clinical Social Worker

## 2018-09-21 ENCOUNTER — Other Ambulatory Visit: Payer: Self-pay

## 2018-09-22 ENCOUNTER — Other Ambulatory Visit: Payer: Self-pay | Admitting: Family Medicine

## 2018-09-22 MED ORDER — ATORVASTATIN CALCIUM 40 MG PO TABS: 40.0000 mg | ORAL_TABLET | Freq: Every day | ORAL | 3 refills | Status: DC

## 2018-09-26 ENCOUNTER — Other Ambulatory Visit: Payer: Self-pay

## 2018-09-26 ENCOUNTER — Encounter (HOSPITAL_COMMUNITY): Payer: Self-pay | Admitting: Licensed Clinical Social Worker

## 2018-09-26 ENCOUNTER — Ambulatory Visit (INDEPENDENT_AMBULATORY_CARE_PROVIDER_SITE_OTHER): Payer: BC Managed Care – PPO | Admitting: Licensed Clinical Social Worker

## 2018-09-26 DIAGNOSIS — F1020 Alcohol dependence, uncomplicated: Secondary | ICD-10-CM | POA: Diagnosis not present

## 2018-09-26 DIAGNOSIS — F1220 Cannabis dependence, uncomplicated: Secondary | ICD-10-CM | POA: Diagnosis not present

## 2018-09-26 NOTE — Progress Notes (Signed)
Virtual Visit via Video Note  I connected with Thomas Williamson on 09/26/18 at  3:00 PM EDT by a video enabled telemedicine application and verified that I am speaking with the correct person using two identifiers.   I discussed the limitations of evaluation and management by telemedicine and the availability of in person appointments. The patient expressed understanding and agreed to proceed.  History of Present Illness: Pt is referred to individual therapy after completion of CD-IOP for alcoholism. Pt has previously been to rehab facilities ([facility] and [name]).    Observations/Objective: Pt presented irritated for his webex session. Pt described his psychiatric symptoms and current life events. Pt has not been to therapy for 3 weeks due to scheduling issues. Pt reports he has relapsed 2 x within last 10 days. Used socratic questions. Pt continues to feel sorry for himself: job, parents, self loathing. Coached pt on self loathing, exploring the   the entire concept of self-loathing: why it's  experienced, where it comes from, the types and signs of self-loathing, and how to begin the path of self-love.Encouraged patient to go to AA, find a sponsor, get phone #'s and begin his path of recovery.    Assessment and Plan: Review tx plan from CD-IOP   Follow Up Instructions:    I discussed the assessment and treatment plan with the patient. The patient was provided an opportunity to ask questions and all were answered. The patient agreed with the plan and demonstrated an understanding of the instructions.   The patient was advised to call back or seek an in-person evaluation if the symptoms worsen or if the condition fails to improve as anticipated.  I provided 60 minutes of non-face-to-face time during this encounter.   Raenell Mensing S, LCAS

## 2018-09-27 ENCOUNTER — Ambulatory Visit (INDEPENDENT_AMBULATORY_CARE_PROVIDER_SITE_OTHER): Payer: BC Managed Care – PPO | Admitting: Psychiatry

## 2018-09-27 ENCOUNTER — Other Ambulatory Visit: Payer: Self-pay

## 2018-09-27 DIAGNOSIS — F1220 Cannabis dependence, uncomplicated: Secondary | ICD-10-CM | POA: Diagnosis not present

## 2018-09-27 DIAGNOSIS — F3171 Bipolar disorder, in partial remission, most recent episode hypomanic: Secondary | ICD-10-CM

## 2018-09-27 DIAGNOSIS — F1021 Alcohol dependence, in remission: Secondary | ICD-10-CM

## 2018-09-27 MED ORDER — DOXEPIN HCL 50 MG PO CAPS: 50.0000 mg | ORAL_CAPSULE | Freq: Every day | ORAL | 0 refills | Status: DC

## 2018-09-27 MED ORDER — LITHIUM CARBONATE ER 300 MG PO TBCR: 600.0000 mg | EXTENDED_RELEASE_TABLET | Freq: Every day | ORAL | 0 refills | Status: DC

## 2018-09-27 MED ORDER — OLANZAPINE 5 MG PO TABS: 5.0000 mg | ORAL_TABLET | Freq: Every day | ORAL | 0 refills | Status: DC

## 2018-09-27 NOTE — Progress Notes (Signed)
BH MD/PA/NP OP Progress Note  09/27/2018 10:26 AM Thomas Williamson  MRN:  [id number] Interview was conducted by phone and I verified that I was speaking with the correct person using two identifiers. I discussed the limitations of evaluation and management by telemedicine and  the availability of in person appointments. Patient expressed understanding and agreed to proceed.  Chief Complaint: "I am doing well".  HPI: 43yo male withBPAD and alcohol use disorderin partial remission (no regular alcohol use since May 5th this year but admits to brief relapses x 2 in last 2 weeks). He went to 30 day rehab in [name] and moved to his parents home where there is no alcohol. 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 He participates in EtOH-IOP at [facility] but is resistant to going to AA saying "It did not work for me in the past". He [place] stopped taking naltrexone about a month ago again arguing that he did not see it as being clearly effective. Moodremains neutral, sleep and appetite good. He does not use doxepin often. Lithium dose was increased to 300 mg bid while in [name] but now takes a whole dose at bedtime. He has complained of loss of libido and we decreased dose of Lexapro and eventually discontinued it. Wellbutrin was increased instead. He takesolanzapine prn anxiety, doxepin for insomnia, lithium for mood stabilization. No complains of low libido and energy at this time.    Visit Diagnosis:    ICD-10-CM   1. Alcohol use disorder, severe, in early remission ([place])  F10.21   2. Bipolar disorder, in partial remission, most recent episode hypomanic ([place])  F31.71   3. Cannabis dependence ([place])  F12.20     Past Psychiatric History: Please see intake H&P.  Past Medical History:  Past Medical History:  Diagnosis Date  . Alcohol use disorder, severe, in early remission ([place])  03/30/2018  . Allergy   . Anxiety   . Bipolar disorder, in partial remission, most recent episode hypomanic ([place]) 03/30/2018  . Bleeding internal hemorrhoids 09/12/2013  . Cannabis dependence ([place]) 07/13/2018   Daily use  . Cervical disc disease 11/20/2014  . Chronic anxiety 07/13/2018  . Depression   . GERD (gastroesophageal reflux disease)   . Hernia, inguinal, bilateral 02/24/2011  . High ankle sprain of right lower extremity 01/16/2015  . Hx of hepatitis C 10/05/2017   -treated in 2019 -hepatology recommended no further surveillance needed except for LFTs with labs and see hepatologist if elevated  . Hypertension   . Lipids abnormal 09/29/2013    Past Surgical History:  Procedure Laterality Date  . ANTERIOR CRUCIATE LIGAMENT REPAIR  2010  . INGUINAL HERNIA REPAIR  02/23/2012   Procedure: LAPAROSCOPIC BILATERAL INGUINAL HERNIA REPAIR;  Surgeon: Shann Medal, MD;  Location: [facility];  Service: General;  Laterality: Bilateral;  Laparoscopic Bilateral Inguinal Hernia Repair with mesh  . INSERTION OF MESH  02/23/2012   Procedure: INSERTION OF MESH;  Surgeon: Shann Medal, MD;  Location: [facility];  Service: General;  Laterality: N/A;  . NOSE SURGERY  [phone]    Family Psychiatric History: None  Family History:  Family History  Problem Relation Age of Onset  . Hypertension Mother   . Atrial fibrillation Mother   . Hypertension Father   . Leukemia Father   . Prostate cancer Father   . Diabetes Brother     Social  History:  Social History   Socioeconomic History  . Marital status: Single    Spouse name: Not on file  . Number of children: 0  . Years of education: Not on file  . Highest education level: Bachelor's degree (e.g., BA, AB, BS)  Occupational History  . Not on file  Social Needs  . Financial resource strain: Somewhat hard  . Food insecurity    Worry: Sometimes true    Inability: Sometimes true  . Transportation needs    Medical: No    Non-medical: No  Tobacco Use   . Smoking status: Never Smoker  . Smokeless tobacco: Never Used  Substance and Sexual Activity  . Alcohol use: Not Currently    Alcohol/week: 8.0 standard drinks    Types: 8 Shots of liquor per week  . Drug use: Yes    Types: Marijuana    Comment: week ago  . Sexual activity: Not Currently  Lifestyle  . Physical activity    Days per week: 0 days    Minutes per session: 0 min  . Stress: Very much  Relationships  . Social Herbalist on phone: More than three times a week    Gets together: Twice a week    Attends religious service: Never    Active member of club or organization: Yes    Attends meetings of clubs or organizations: More than 4 times per year    Relationship status: Never married  Other Topics Concern  . 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 Hydrocodone     UPSETS STOMACH    Metabolic Disorder Labs: No results found for: HGBA1C, MPG No results found for: PROLACTIN Lab Results  Component Value Date   CHOL 241 (H) 09/19/2018   TRIG 316.0 (H) 09/19/2018   HDL 47.00 09/19/2018   CHOLHDL 5 09/19/2018   VLDL 63.2 (H) 09/19/2018   Lab Results  Component Value Date   TSH 0.74 03/31/2016   TSH 0.48 09/20/2013    Therapeutic Level Labs: Lab Results  Component Value Date   LITHIUM 0.6 12/18/2016   No results found for: VALPROATE No components found for:  CBMZ  Current Medications: Current Outpatient Medications  Medication Sig Dispense Refill  . atorvastatin (LIPITOR) 40 MG tablet Take 1 tablet (40 mg total) by mouth daily. 90 tablet 3  . baclofen (LIORESAL) 10 MG tablet Take 1 tablet (10 mg total) by mouth 3 (three) times daily. 90 tablet 1  . buPROPion (WELLBUTRIN XL) 300 MG 24 hr tablet TAKE 1  TABLET BY MOUTH EVERY DAY 90 tablet 1  . doxepin (SINEQUAN) 50 MG capsule Take 1 capsule (50 mg total) by mouth at bedtime. 90 capsule 0  . hydrochlorothiazide (HYDRODIURIL) 25 MG tablet Take 1 tablet (25 mg total) by mouth daily. 90 tablet 3  . lithium carbonate (LITHOBID) 300 MG CR tablet TAKE 2 TABLETS (600 MG TOTAL) BY MOUTH AT BEDTIME. 180 tablet 1  . losartan (COZAAR) 100 MG tablet Take 1 tablet (100 mg total) by mouth daily. 90 tablet 0  . metoprolol succinate (TOPROL-XL) 100 MG 24 hr tablet Take 1 tablet (100  mg total) by mouth daily. Take with or immediately following a meal. 90 tablet 3  . OLANZapine (ZYPREXA) 5 MG tablet Take 1 tablet (5 mg total) by mouth at bedtime. 90 tablet 0  . omeprazole (PRILOSEC) 20 MG capsule Take 20 mg by mouth daily.    . valACYclovir (VALTREX) 1000 MG tablet Take 1 tablet (1,000 mg total) by mouth 2 (two) times daily as needed (fever blisters). 60 tablet 5   No current facility-administered medications for this visit.      Psychiatric Specialty Exam: Review of Systems  All other systems reviewed and are negative.   There were no vitals taken for this visit.There is no height or weight on file to calculate BMI.  General Appearance: NA  Eye Contact:  NA  Speech:  Clear and Coherent and Normal Rate  Volume:  Normal  Mood:  Euthymic  Affect:  NA  Thought Process:  Goal Directed and Linear  Orientation:  Full (Time, Place, and Person)  Thought Content: Logical   Suicidal Thoughts:  No  Homicidal Thoughts:  No  Memory:  Immediate;   Good Recent;   Good Remote;   Fair  Judgement:  Fair  Insight:  Fair  Psychomotor Activity:  NA  Concentration:  Concentration: Good  Recall:  Good  Fund of Knowledge: Good  Language: Good  Akathisia:  Negative  Handed:  Right  AIMS (if indicated): not done  Assets:  Communication Skills Desire for Improvement Housing Physical Health Resilience Social Support  ADL's:  Intact  Cognition: WNL  Sleep:  Good    Screenings: AUDIT     Counselor from 07/11/2018 in [facility]  Alcohol Use Disorder Identification Test Final Score (AUDIT)  30    GAD-7     Counselor from 07/11/2018 in [facility]  Total GAD-7 Score  9    PHQ2-9     Counselor from 07/11/2018 in [facility]  PHQ-2 Total Score  2  PHQ-9 Total Score  8       Assessment and Plan: 43yo male withBPAD and alcohol use disorderin partial remission (no regular alcohol use since May 5th this year but admits to brief relapses x 2 in last 2 weeks). He went to 30 day rehab in [name] and moved to his parents home where there is no alcohol. 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 He participates in EtOH-IOP at [facility] but is resistant to going to AA saying "It did not work for me in the past". He [place] stopped taking naltrexone about a month ago again arguing that he did not see it as being clearly effective. Moodremains neutral, sleep and appetite good. He does not use doxepin often. Lithium dose was increased to 300 mg bid while in [name] but now takes a whole dose at bedtime. He has complained of loss of libido and we decreased dose of Lexapro and eventually discontinued it. Wellbutrin was increased instead. He takesolanzapine prn anxiety, doxepin for insomnia, lithium for mood stabilization. No complains of low libido and energy at this time.   Plan: Continue Wellbutrin XL 300 mg, lithium CR 600 mg at HS, doxepin and olanzapine unchanged. We will get lithium level and TSH tomorrow (creatinine is normal). Continue counseling with Dorian Furnace. Return to clinic in two months. The plan was discussed with patient who  had an opportunity to ask questions and these were all answered. I spend 25 minutes in phone consultation with  the patient.     Stephanie Acre, MD 09/27/2018, 10:26 AM

## 2018-09-29 NOTE — Progress Notes (Deleted)
Cardiology Office Note:    Date:  09/29/2018   ID:  Thomas Williamson, DOB 11/05/1975, MRN [id number]  PCP:  [name], MD  Cardiologist:  Candee Furbish, MD *** Electrophysiologist:  None   Referring MD: Lucretia Kern, DO   No chief complaint on file. ***  History of Present Illness:    Thomas Williamson is a 43 y.o. male with ***  Systolic CHF  Dilated CM  Echocardiogram 09/2018: EF 30-35, Gr 2 DD  Myoview 09/2018: No scar or ischemia   Hypertension   Alcohol abuse  Depression/anxiety  LBBB  Hepatitis C >> treated  GERD  Cervical DDD  Thomas Williamson was evaluated by Dr. Marlou Porch in 08/2018.  The patient had been admitted for alcohol use in [name] and was noted to have an elevated HR and BP at that time.  His ECG demonstrated a LBBB.  A Myoview and Echocardiogram was arranged.  His EF is low at 30-35 on echocardiogram and his Myoview showed no ischemia or scar.  His beta-blocker was changed to Metoprolol succinate.  Dr. Marlou Porch recommended eventually getting a Coronary CTA to exclude coronary artery disease once his HR is blunted enough.  ***  Prior CV studies:   The following studies were reviewed today:  Myoview 09/05/2018 EF 37, no ischemia or infarction; Intermediate Risk  Echocardiogram 09/05/2018 EF 30-35, mild LVH, mild LVE, Gr 2 DD, diff HK, normal RVSF,   Past Medical History:  Diagnosis Date  . Alcohol use disorder, severe, in early remission ([place]) 03/30/2018  . Allergy   . Anxiety   . Bipolar disorder, in partial remission, most recent episode hypomanic ([place]) 03/30/2018  . Bleeding internal hemorrhoids 09/12/2013  . Cannabis dependence ([place]) 07/13/2018   Daily use  . Cervical disc disease 11/20/2014  . Chronic anxiety 07/13/2018  . Depression   . GERD (gastroesophageal reflux disease)   . Hernia, inguinal, bilateral 02/24/2011  . High ankle sprain of right lower extremity 01/16/2015  . Hx of hepatitis C 10/05/2017   -treated in 2019 -hepatology recommended no further  surveillance needed except for LFTs with labs and see hepatologist if elevated  . Hypertension   . Lipids abnormal 09/29/2013   Surgical Hx: The patient  has a past surgical history that includes Anterior cruciate ligament repair (2010); Nose surgery [id number]); Inguinal hernia repair (02/23/2012); and Insertion of mesh (02/23/2012).   Current Medications: No outpatient medications have been marked as taking for the 09/30/18 encounter (Appointment) with Richardson Dopp T, PA-C.     Allergies:   Hydrocodone   Social History   Tobacco Use  . Smoking status: Never Smoker  . Smokeless tobacco: Never Used  Substance Use Topics  . Alcohol use: Not Currently    Alcohol/week: 8.0 standard drinks    Types: 8 Shots of liquor per week  . Drug use: Yes    Types: Marijuana    Comment: week ago     Family Hx: The patient's family history includes Atrial fibrillation in his mother; Diabetes in his brother; Hypertension in his father and mother; Leukemia in his father; Prostate cancer in his father.  ROS:   Please see the history of present illness.    ROS All other systems reviewed and are negative.   EKGs/Labs/Other Test Reviewed:    EKG:  EKG is *** ordered today.  The ekg ordered today demonstrates ***  Recent Labs: 02/17/2018: Hemoglobin 13.3; Platelets 305.0 09/19/2018: ALT 22; BUN 13; Creatinine, Ser 1.03; Potassium 4.5; Sodium 140  Recent Lipid Panel Lab Results  Component Value Date/Time   CHOL 241 (H) 09/19/2018 09:47 AM   TRIG 316.0 (H) 09/19/2018 09:47 AM   HDL 47.00 09/19/2018 09:47 AM   CHOLHDL 5 09/19/2018 09:47 AM   LDLDIRECT 157.0 09/19/2018 09:47 AM    Physical Exam:    VS:  There were no vitals taken for this visit.    Wt Readings from Last 3 Encounters:  09/16/18 205 lb 3.2 oz (93.1 kg)  09/05/18 203 lb (92.1 kg)  08/22/18 203 lb 6.4 oz (92.3 kg)     ***Physical Exam  ASSESSMENT & PLAN:    No diagnosis found.***  Dispo:  No follow-ups on file.    Medication Adjustments/Labs and Tests Ordered: Current medicines are reviewed at length with the patient today.  Concerns regarding medicines are outlined above.  Tests Ordered: No orders of the defined types were placed in this encounter.  Medication Changes: No orders of the defined types were placed in this encounter.   Signed, Rowley Groeneweg, PA-C  09/29/2018 8:31 PM    [facility] [address], [place], [place]  [address] Phone: [phone]; Fax: [phone]

## 2018-09-30 ENCOUNTER — Ambulatory Visit: Payer: BC Managed Care – PPO | Admitting: Physician Assistant

## 2018-10-04 ENCOUNTER — Telehealth (HOSPITAL_COMMUNITY): Payer: Self-pay | Admitting: Licensed Clinical Social Worker

## 2018-10-04 ENCOUNTER — Other Ambulatory Visit: Payer: Self-pay

## 2018-10-04 ENCOUNTER — Ambulatory Visit (HOSPITAL_COMMUNITY): Payer: BC Managed Care – PPO | Admitting: Licensed Clinical Social Worker

## 2018-10-04 NOTE — Telephone Encounter (Signed)
Pt did not present for his webext therapy appointment. sent webex reminder email and called pat to remind him of webex appointment. Mandalyn Pasqua, LCAS

## 2018-10-18 ENCOUNTER — Other Ambulatory Visit: Payer: Self-pay

## 2018-10-18 ENCOUNTER — Telehealth (HOSPITAL_COMMUNITY): Payer: Self-pay | Admitting: Licensed Clinical Social Worker

## 2018-10-18 ENCOUNTER — Ambulatory Visit (HOSPITAL_COMMUNITY): Payer: BC Managed Care – PPO | Admitting: Licensed Clinical Social Worker

## 2018-10-18 NOTE — Telephone Encounter (Signed)
Pt did not join webex session today. called & left vm, also resent reminder email through webex. Alver Fisher, MS, LCAS

## 2018-10-25 ENCOUNTER — Other Ambulatory Visit: Payer: Self-pay

## 2018-10-25 ENCOUNTER — Encounter (HOSPITAL_COMMUNITY): Payer: Self-pay | Admitting: Licensed Clinical Social Worker

## 2018-10-25 ENCOUNTER — Ambulatory Visit (INDEPENDENT_AMBULATORY_CARE_PROVIDER_SITE_OTHER): Payer: BC Managed Care – PPO | Admitting: Licensed Clinical Social Worker

## 2018-10-25 DIAGNOSIS — F1020 Alcohol dependence, uncomplicated: Secondary | ICD-10-CM

## 2018-10-25 NOTE — Progress Notes (Signed)
Virtual Visit via Video Note  I connected with Thomas Williamson on 10/25/18 at 10:00 AM EDT by a video enabled telemedicine application and verified that I am speaking with the correct person using two identifiers.   I discussed the limitations of evaluation and management by telemedicine and the availability of in person appointments. The patient expressed understanding and agreed to proceed.  History of Present Illness: Pt is referred to individual therapy after completion of CD-IOP for alcoholism. Pt has previously been to rehab facilities ([place] and [name]).    Observations/Objective: Pt presented frustrated for his webex session. Pt described his psychiatric symptoms and current life events. Pt has not been to therapy for 4 weeks. Pt reports he has been drinking. Used socratic questions. Discussed options. Pt reports he almost went back to rehab in [name] last week but changed his mind. He reports going to meetings but doesn't get a lot out of it. Patient doesn't really want to stop drinking, "I just want to control my drinking." Pt acknowledges he uses alcohol as a coping tool. Patient expressed interest in suggestion of [facility] continuity of care. Suggested patient research [facility] to see if he is interested in following through with referral. Also discussed Vivitrol injection with patient to help with cravings. He will research that as well. Will reach out to Dr. Mamie Nick for referral for vivitrol shot. Sent chat message to regan, CMA, who reached out to Dr. Mamie Nick who is agreement to the vivitrol shot.      Assessment and Plan: Review tx plan from CD-IOP   Follow Up Instructions:    I discussed the assessment and treatment plan with the patient. The patient was provided an opportunity to ask questions and all were answered. The patient agreed with the plan and demonstrated an understanding of the instructions.   The patient was advised to call back or seek an in-person evaluation if the  symptoms worsen or if the condition fails to improve as anticipated.  I provided 40 minutes of non-face-to-face time during this encounter.   Thomas Williamson S, LCAS

## 2018-11-01 ENCOUNTER — Other Ambulatory Visit: Payer: Self-pay

## 2018-11-01 ENCOUNTER — Encounter (HOSPITAL_COMMUNITY): Payer: Self-pay | Admitting: Licensed Clinical Social Worker

## 2018-11-01 ENCOUNTER — Ambulatory Visit (INDEPENDENT_AMBULATORY_CARE_PROVIDER_SITE_OTHER): Payer: BC Managed Care – PPO | Admitting: Licensed Clinical Social Worker

## 2018-11-01 DIAGNOSIS — F1020 Alcohol dependence, uncomplicated: Secondary | ICD-10-CM

## 2018-11-01 DIAGNOSIS — F1220 Cannabis dependence, uncomplicated: Secondary | ICD-10-CM | POA: Diagnosis not present

## 2018-11-01 NOTE — Progress Notes (Signed)
Virtual Visit via Video Note  I connected with Trenda Moots on 11/01/18 at 10:00 AM EDT by a video enabled telemedicine application and verified that I am speaking with the correct person using two identifiers.   I discussed the limitations of evaluation and management by telemedicine and the availability of in person appointments. The patient expressed understanding and agreed to proceed.  History of Present Illness: Pt is referred to individual therapy after completion of CD-IOP for alcoholism. Pt has previously been to rehab facilities ([facility] [place] and [name]).    Observations/Objective: Pt presented frustrated for his webex session. Pt described his psychiatric symptoms and current life events. Pt reports he has drank 1x since last session. "I did not continue drinking except for that 1 day." Discussed cycle of relapse, stopping or interrupting the thought. Pt decided he didn't want to go inpatient rehab at this time, "I'd feel like a failure, however I am interested in taking the vivatrol shot. Provided education on the benefits of the shot. Made arrangements with CMA Regan for patient to receive the shot. Suggested to pt to call his insurance co to see if he needs preauth or coverage. Told pt his psychiatrist Dr. Mamie Nick was in favor in the shot. Pt reports he has been going to in-person AA meetings, 3 per week. Provided education on the importance of being active in meetings: coming early. Socializing at a distance, looking for a sponsor. Told pt of a meeting on Saturday night where there is a lot of sobriety.  Assessment and Plan:Continue to see patient weekly assisting patient with maintaining a recovery plan.  Follow Up Instructions:    I discussed the assessment and treatment plan with the patient. The patient was provided an opportunity to ask questions and all were answered. The patient agreed with the plan and demonstrated an understanding of the instructions.   The patient was advised to  call back or seek an in-person evaluation if the symptoms worsen or if the condition fails to improve as anticipated.  I provided 6040 minutes of non-face-to-face time during this encounter.   MACKENZIE,LISBETH S, LCAS

## 2018-11-08 ENCOUNTER — Other Ambulatory Visit: Payer: Self-pay

## 2018-11-08 ENCOUNTER — Ambulatory Visit (HOSPITAL_COMMUNITY): Payer: BC Managed Care – PPO | Admitting: Licensed Clinical Social Worker

## 2018-11-08 ENCOUNTER — Other Ambulatory Visit: Payer: Self-pay | Admitting: Family Medicine

## 2018-11-08 NOTE — Telephone Encounter (Signed)
Looks like there was a change to the pravachol?

## 2018-11-10 ENCOUNTER — Other Ambulatory Visit (HOSPITAL_COMMUNITY): Payer: Self-pay | Admitting: Psychiatry

## 2018-11-16 ENCOUNTER — Ambulatory Visit (HOSPITAL_COMMUNITY): Payer: BC Managed Care – PPO | Admitting: Licensed Clinical Social Worker

## 2018-11-16 ENCOUNTER — Other Ambulatory Visit: Payer: Self-pay

## 2018-11-16 ENCOUNTER — Telehealth (HOSPITAL_COMMUNITY): Payer: Self-pay | Admitting: Licensed Clinical Social Worker

## 2018-11-16 NOTE — Telephone Encounter (Signed)
I called pt after he did not join the webex session and left a message on his vm to join session. He did not. Thomas Williamson [facility]

## 2018-11-18 ENCOUNTER — Inpatient Hospital Stay
Admission: EM | Admit: 2018-11-18 | Discharge: 2018-11-19 | DRG: 641 | Disposition: A | Discharge status: Home / Self Care | Payer: BC Managed Care – PPO | Attending: Specialist | Admitting: Specialist

## 2018-11-18 ENCOUNTER — Encounter: Payer: Self-pay | Admitting: Emergency Medicine

## 2018-11-18 ENCOUNTER — Other Ambulatory Visit: Payer: Self-pay

## 2018-11-18 ENCOUNTER — Emergency Department: Payer: BC Managed Care – PPO

## 2018-11-18 ENCOUNTER — Inpatient Hospital Stay: Payer: BC Managed Care – PPO

## 2018-11-18 DIAGNOSIS — Z8249 Family history of ischemic heart disease and other diseases of the circulatory system: Secondary | ICD-10-CM

## 2018-11-18 DIAGNOSIS — N179 Acute kidney failure, unspecified: Secondary | ICD-10-CM | POA: Diagnosis not present

## 2018-11-18 DIAGNOSIS — R251 Tremor, unspecified: Secondary | ICD-10-CM | POA: Diagnosis present

## 2018-11-18 DIAGNOSIS — F1220 Cannabis dependence, uncomplicated: Secondary | ICD-10-CM | POA: Diagnosis not present

## 2018-11-18 DIAGNOSIS — E860 Dehydration: Secondary | ICD-10-CM

## 2018-11-18 DIAGNOSIS — F419 Anxiety disorder, unspecified: Secondary | ICD-10-CM | POA: Diagnosis not present

## 2018-11-18 DIAGNOSIS — F10988 Alcohol use, unspecified with other alcohol-induced disorder: Secondary | ICD-10-CM | POA: Diagnosis not present

## 2018-11-18 DIAGNOSIS — E872 Acidosis: Secondary | ICD-10-CM | POA: Diagnosis not present

## 2018-11-18 DIAGNOSIS — B1920 Unspecified viral hepatitis C without hepatic coma: Secondary | ICD-10-CM | POA: Diagnosis not present

## 2018-11-18 DIAGNOSIS — I10 Essential (primary) hypertension: Secondary | ICD-10-CM | POA: Diagnosis not present

## 2018-11-18 DIAGNOSIS — Z23 Encounter for immunization: Secondary | ICD-10-CM

## 2018-11-18 DIAGNOSIS — M5090 Cervical disc disorder, unspecified, unspecified cervical region: Secondary | ICD-10-CM | POA: Diagnosis not present

## 2018-11-18 DIAGNOSIS — Z79899 Other long term (current) drug therapy: Secondary | ICD-10-CM

## 2018-11-18 DIAGNOSIS — F1010 Alcohol abuse, uncomplicated: Secondary | ICD-10-CM | POA: Diagnosis not present

## 2018-11-18 DIAGNOSIS — I447 Left bundle-branch block, unspecified: Secondary | ICD-10-CM | POA: Diagnosis not present

## 2018-11-18 DIAGNOSIS — Z20828 Contact with and (suspected) exposure to other viral communicable diseases: Secondary | ICD-10-CM | POA: Diagnosis not present

## 2018-11-18 DIAGNOSIS — E785 Hyperlipidemia, unspecified: Secondary | ICD-10-CM | POA: Diagnosis present

## 2018-11-18 DIAGNOSIS — K219 Gastro-esophageal reflux disease without esophagitis: Secondary | ICD-10-CM | POA: Diagnosis not present

## 2018-11-18 DIAGNOSIS — Z885 Allergy status to narcotic agent status: Secondary | ICD-10-CM

## 2018-11-18 DIAGNOSIS — F319 Bipolar disorder, unspecified: Secondary | ICD-10-CM | POA: Diagnosis not present

## 2018-11-18 DIAGNOSIS — R000 Tachycardia, unspecified: Secondary | ICD-10-CM | POA: Diagnosis not present

## 2018-11-18 DIAGNOSIS — Z03818 Encounter for observation for suspected exposure to other biological agents ruled out: Secondary | ICD-10-CM | POA: Diagnosis not present

## 2018-11-18 DIAGNOSIS — R1110 Vomiting, unspecified: Secondary | ICD-10-CM | POA: Diagnosis not present

## 2018-11-18 DIAGNOSIS — R778 Other specified abnormalities of plasma proteins: Secondary | ICD-10-CM | POA: Diagnosis not present

## 2018-11-18 DIAGNOSIS — R749 Abnormal serum enzyme level, unspecified: Secondary | ICD-10-CM | POA: Diagnosis not present

## 2018-11-18 DIAGNOSIS — E8729 Other acidosis: Secondary | ICD-10-CM

## 2018-11-18 HISTORY — DX: Pneumonia, unspecified organism: J18.9

## 2018-11-18 HISTORY — DX: Personal history of other diseases of the digestive system: Z87.19

## 2018-11-18 HISTORY — DX: Other complications of anesthesia, initial encounter: T88.59XA

## 2018-11-18 LAB — BLOOD GAS, VENOUS
Acid-base deficit: 2.7 mmol/L — ABNORMAL HIGH (ref 0.0–2.0)
Bicarbonate: 20.6 mmol/L (ref 20.0–28.0)
O2 Saturation: 61.9 %
Patient temperature: 37
pCO2, Ven: 31 mmHg — ABNORMAL LOW (ref 44.0–60.0)
pH, Ven: 7.43 (ref 7.250–7.430)
pO2, Ven: 31 mmHg — CL (ref 32.0–45.0)

## 2018-11-18 LAB — CBC WITH DIFFERENTIAL/PLATELET
Abs Immature Granulocytes: 0.18 10*3/uL — ABNORMAL HIGH (ref 0.00–0.07)
Basophils Absolute: 0 10*3/uL (ref 0.0–0.1)
Basophils Relative: 0 %
Eosinophils Absolute: 0 10*3/uL (ref 0.0–0.5)
Eosinophils Relative: 0 %
HCT: 39.3 % (ref 39.0–52.0)
Hemoglobin: 13.5 g/dL (ref 13.0–17.0)
Immature Granulocytes: 1 %
Lymphocytes Relative: 9 %
Lymphs Abs: 1.9 10*3/uL (ref 0.7–4.0)
MCH: 31.1 pg (ref 26.0–34.0)
MCHC: 34.4 g/dL (ref 30.0–36.0)
MCV: 90.6 fL (ref 80.0–100.0)
Monocytes Absolute: 1.4 10*3/uL — ABNORMAL HIGH (ref 0.1–1.0)
Monocytes Relative: 7 %
Neutro Abs: 17.9 10*3/uL — ABNORMAL HIGH (ref 1.7–7.7)
Neutrophils Relative %: 83 %
Platelets: 461 10*3/uL — ABNORMAL HIGH (ref 150–400)
RBC: 4.34 MIL/uL (ref 4.22–5.81)
RDW: 14.1 % (ref 11.5–15.5)
WBC: 21.5 10*3/uL — ABNORMAL HIGH (ref 4.0–10.5)
nRBC: 0 % (ref 0.0–0.2)

## 2018-11-18 LAB — ETHANOL: Alcohol, Ethyl (B): <10 mg/dL (ref <10)

## 2018-11-18 LAB — LACTIC ACID, PLASMA
Lactic Acid, Venous: 3.1 mmol/L (ref 0.5–1.9)
Lactic Acid, Venous: 1.9 mmol/L (ref 0.5–1.9)
Lactic Acid, Venous: 1.6 mmol/L (ref 0.5–1.9)

## 2018-11-18 LAB — COMPREHENSIVE METABOLIC PANEL
ALT: 25 U/L (ref 0–44)
AST: 24 U/L (ref 15–41)
Albumin: 5.4 g/dL — ABNORMAL HIGH (ref 3.5–5.0)
Alkaline Phosphatase: 120 U/L (ref 38–126)
Anion gap: 33 — ABNORMAL HIGH (ref 5–15)
BUN: 37 mg/dL — ABNORMAL HIGH (ref 6–20)
CO2: 15 mmol/L — ABNORMAL LOW (ref 22–32)
Calcium: 9.4 mg/dL (ref 8.9–10.3)
Chloride: 91 mmol/L — ABNORMAL LOW (ref 98–111)
Creatinine, Ser: 3.23 mg/dL — ABNORMAL HIGH (ref 0.61–1.24)
GFR calc Af Amer: 26 mL/min — ABNORMAL LOW (ref >60)
GFR calc non Af Amer: 22 mL/min — ABNORMAL LOW (ref >60)
Glucose, Bld: 157 mg/dL — ABNORMAL HIGH (ref 70–99)
Potassium: 3.8 mmol/L (ref 3.5–5.1)
Sodium: 139 mmol/L (ref 135–145)
Total Bilirubin: 2.6 mg/dL — ABNORMAL HIGH (ref 0.3–1.2)
Total Protein: 9.8 g/dL — ABNORMAL HIGH (ref 6.5–8.1)

## 2018-11-18 LAB — LIPASE, BLOOD: Lipase: 26 U/L (ref 11–51)

## 2018-11-18 LAB — URINE DRUG SCREEN, QUALITATIVE (ARMC ONLY)
Amphetamines, Ur Screen: NOT DETECTED
Barbiturates, Ur Screen: NOT DETECTED
Benzodiazepine, Ur Scrn: NOT DETECTED
Cannabinoid 50 Ng, Ur ~~LOC~~: POSITIVE — AB
Cocaine Metabolite,Ur ~~LOC~~: NOT DETECTED
MDMA (Ecstasy)Ur Screen: NOT DETECTED
Methadone Scn, Ur: NOT DETECTED
Opiate, Ur Screen: NOT DETECTED
Phencyclidine (PCP) Ur S: NOT DETECTED
Tricyclic, Ur Screen: POSITIVE — AB

## 2018-11-18 LAB — TROPONIN I (HIGH SENSITIVITY)
Troponin I (High Sensitivity): 323 ng/L (ref <18)
Troponin I (High Sensitivity): 312 ng/L (ref <18)

## 2018-11-18 LAB — OSMOLALITY: Osmolality: 309 mOsm/kg — ABNORMAL HIGH (ref 275–295)

## 2018-11-18 LAB — CK: Total CK: 319 U/L (ref 49–397)

## 2018-11-18 LAB — BETA-HYDROXYBUTYRIC ACID: Beta-Hydroxybutyric Acid: 5.38 mmol/L — ABNORMAL HIGH (ref 0.05–0.27)

## 2018-11-18 LAB — MAGNESIUM
Magnesium: 1.2 mg/dL — ABNORMAL LOW (ref 1.7–2.4)
Magnesium: 1.8 mg/dL (ref 1.7–2.4)

## 2018-11-18 LAB — OSMOLALITY, URINE: Osmolality, Ur: 495 mOsm/kg (ref 300–900)

## 2018-11-18 MED ORDER — SODIUM CHLORIDE 0.9 % IV BOLUS
1000.0000 mL | Freq: Once | INTRAVENOUS | Status: AC
  Administered 2018-11-18: 1000 mL via INTRAVENOUS

## 2018-11-18 MED ORDER — MAGNESIUM SULFATE 2 GM/50ML IV SOLN
2.0000 g | Freq: Once | INTRAVENOUS | Status: AC
  Administered 2018-11-18: 2 g via INTRAVENOUS
  Filled 2018-11-18: qty 50

## 2018-11-18 MED ORDER — INFLUENZA VAC SPLIT QUAD 0.5 ML IM SUSY
0.5000 mL | PREFILLED_SYRINGE | INTRAMUSCULAR | Status: AC
  Administered 2018-11-19: 0.5 mL via INTRAMUSCULAR
  Filled 2018-11-18: qty 0.5

## 2018-11-18 MED ORDER — HALOPERIDOL LACTATE 5 MG/ML IJ SOLN
2.0000 mg | Freq: Once | INTRAMUSCULAR | Status: AC
  Administered 2018-11-18: 2 mg via INTRAVENOUS
  Filled 2018-11-18: qty 1

## 2018-11-18 MED ORDER — ADULT MULTIVITAMIN W/MINERALS CH
1.0000 | ORAL_TABLET | Freq: Every day | ORAL | Status: DC
  Administered 2018-11-18 – 2018-11-19 (×2): 1 via ORAL
  Filled 2018-11-18 (×2): qty 1

## 2018-11-18 MED ORDER — METOPROLOL TARTRATE 5 MG/5ML IV SOLN: 5.0000 mg | Freq: Four times a day (QID) | INTRAVENOUS | Status: DC | PRN

## 2018-11-18 MED ORDER — ONDANSETRON HCL 4 MG/2ML IJ SOLN
4.0000 mg | Freq: Once | INTRAMUSCULAR | Status: AC
  Administered 2018-11-18: 4 mg via INTRAVENOUS
  Filled 2018-11-18: qty 2

## 2018-11-18 MED ORDER — THIAMINE HCL 100 MG/ML IJ SOLN
100.0000 mg | Freq: Once | INTRAMUSCULAR | Status: AC
  Administered 2018-11-18: 100 mg via INTRAVENOUS
  Filled 2018-11-18: qty 2

## 2018-11-18 MED ORDER — VITAMIN B-1 100 MG PO TABS
100.0000 mg | ORAL_TABLET | Freq: Every day | ORAL | Status: DC
  Administered 2018-11-19: 100 mg via ORAL
  Filled 2018-11-18: qty 1

## 2018-11-18 MED ORDER — LORAZEPAM 2 MG/ML IJ SOLN
1.0000 mg | INTRAMUSCULAR | Status: DC | PRN
  Administered 2018-11-18: 2 mg via INTRAVENOUS
  Administered 2018-11-18: 1 mg via INTRAVENOUS
  Filled 2018-11-18 (×2): qty 1

## 2018-11-18 MED ORDER — SODIUM CHLORIDE 0.9 % IV SOLN
INTRAVENOUS | Status: DC
  Administered 2018-11-18 – 2018-11-19 (×2): via INTRAVENOUS

## 2018-11-18 MED ORDER — CLONIDINE HCL 0.1 MG PO TABS
0.1000 mg | ORAL_TABLET | Freq: Three times a day (TID) | ORAL | Status: DC
  Administered 2018-11-18 – 2018-11-19 (×3): 0.1 mg via ORAL
  Filled 2018-11-18 (×3): qty 1

## 2018-11-18 MED ORDER — CALCIUM GLUCONATE-NACL 1-0.675 GM/50ML-% IV SOLN
1.0000 g | Freq: Once | INTRAVENOUS | Status: AC
  Administered 2018-11-18: 1000 mg via INTRAVENOUS
  Filled 2018-11-18: qty 50

## 2018-11-18 MED ORDER — HYDRALAZINE HCL 20 MG/ML IJ SOLN: 10.0000 mg | Freq: Four times a day (QID) | INTRAMUSCULAR | Status: DC | PRN

## 2018-11-18 MED ORDER — HEPARIN SODIUM (PORCINE) 5000 UNIT/ML IJ SOLN
5000.0000 [IU] | Freq: Three times a day (TID) | INTRAMUSCULAR | Status: DC
  Administered 2018-11-18 (×2): 5000 [IU] via SUBCUTANEOUS
  Filled 2018-11-18 (×3): qty 1

## 2018-11-18 MED ORDER — DEXTROSE 5 % AND 0.9 % NACL IV BOLUS
1000.0000 mL | Freq: Once | INTRAVENOUS | Status: AC
  Administered 2018-11-18: 1000 mL via INTRAVENOUS
  Filled 2018-11-18: qty 1000

## 2018-11-18 MED ORDER — FOLIC ACID 1 MG PO TABS
1.0000 mg | ORAL_TABLET | Freq: Every day | ORAL | Status: DC
  Administered 2018-11-18 – 2018-11-19 (×2): 1 mg via ORAL
  Filled 2018-11-18 (×2): qty 1

## 2018-11-18 MED ORDER — LORAZEPAM 1 MG PO TABS: 1.0000 mg | ORAL_TABLET | ORAL | Status: DC | PRN

## 2018-11-18 MED ORDER — THIAMINE HCL 100 MG/ML IJ SOLN: 100.0000 mg | Freq: Every day | INTRAMUSCULAR | Status: DC

## 2018-11-18 NOTE — ED Notes (Signed)
Pt transferred to room 226.

## 2018-11-18 NOTE — H&P (Signed)
Thomas Williamson NAME: Thomas Williamson    MR#:  [id number]  DATE OF BIRTH:  1975/02/18  DATE OF ADMISSION:  11/18/2018  PRIMARY CARE PHYSICIAN: Thomas Morale, MD   REQUESTING/REFERRING PHYSICIAN: Marjean Donna  CHIEF COMPLAINT:   Chief Complaint  Patient presents with  . Emesis    HISTORY OF PRESENT ILLNESS:  Thomas Williamson  is a 43 y.o. male with a known history of bipolar disorder, alcohol abuse, hypertension and hepatitis C who presented to the emergency room with complaints of vomiting.  Patient apparently had stopped drinking about 2 weeks ago.  Last night patient went on a binge drinking by drinking an entire bottle of whiskey.  Subsequently started having some abdominal discomfort with nausea and vomiting.  Brought into the emergency room for further evaluation.  Denies any chest pain.  No shortness of breath.  No fevers.  Noted to be having some mild tremors while in the emergency room.  Patient's father at bedside.  Patient evaluated in the emergency room and found to have evidence of alcoholic ketosis with elevated lactic acid level of 3.1.  Evidence of acute kidney injury.  Evidence of elevated troponin of 323.  Twelve-lead EKG with sinus tachycardia with old left bundle branch block.  Denies any chest pain.  Medical service called to admit patient for further evaluation and management.  PAST MEDICAL HISTORY:   Past Medical History:  Diagnosis Date  . Alcohol use disorder, severe, in early remission ([place]) 03/30/2018  . Allergy   . Anxiety   . Bipolar disorder, in partial remission, most recent episode hypomanic ([place]) 03/30/2018  . Bleeding internal hemorrhoids 09/12/2013  . Cannabis dependence ([place]) 07/13/2018   Daily use  . Cervical disc disease 11/20/2014  . Chronic anxiety 07/13/2018  . Complication of anesthesia   . Depression   . GERD (gastroesophageal reflux disease)   . Hernia, inguinal, bilateral 02/24/2011  . High ankle  sprain of right lower extremity 01/16/2015  . History of hiatal hernia   . Hx of hepatitis C 10/05/2017   -treated in 2019 -hepatology recommended no further surveillance needed except for LFTs with labs and see hepatologist if elevated  . Hypertension   . Lipids abnormal 09/29/2013  . Pneumonia   . PONV (postoperative nausea and vomiting)     PAST SURGICAL HISTORY:   Past Surgical History:  Procedure Laterality Date  . ANTERIOR CRUCIATE LIGAMENT REPAIR  2010  . INGUINAL HERNIA REPAIR  02/23/2012   Procedure: LAPAROSCOPIC BILATERAL INGUINAL HERNIA REPAIR;  Surgeon: Shann Medal, MD;  Location: [facility];  Service: General;  Laterality: Bilateral;  Laparoscopic Bilateral Inguinal Hernia Repair with mesh  . INSERTION OF MESH  02/23/2012   Procedure: INSERTION OF MESH;  Surgeon: Shann Medal, MD;  Location: [facility];  Service: General;  Laterality: N/A;  . NOSE SURGERY  [phone]    SOCIAL HISTORY:   Social History   Tobacco Use  . Smoking status: Never Smoker  . Smokeless tobacco: Never Used  Substance Use Topics  . Alcohol use: Not Currently    Alcohol/week: 8.0 standard drinks    Types: 8 Shots of liquor per week    Comment: recovering alcoholic    FAMILY HISTORY:   Family History  Problem Relation Age of Onset  . Hypertension Mother   . Atrial fibrillation Mother   . Hypertension Father   . Leukemia Father   . Prostate cancer Father   .  Diabetes Brother     DRUG ALLERGIES:   Allergies  Allergen Reactions  . Hydrocodone     UPSETS STOMACH    REVIEW OF SYSTEMS:   Review of Systems  Constitutional: Negative for chills and fever.  HENT: Negative for hearing loss and tinnitus.   Eyes: Negative for blurred vision, double vision and photophobia.  Respiratory: Negative for cough and shortness of breath.   Cardiovascular: Negative for chest pain and palpitations.  Gastrointestinal: Positive for nausea and vomiting. Negative for abdominal pain and diarrhea.   Genitourinary: Negative for dysuria and urgency.  Musculoskeletal: Negative for myalgias.  Skin: Negative for itching and rash.  Neurological: Positive for tremors. Negative for dizziness.  Psychiatric/Behavioral: Positive for substance abuse. Negative for depression.    MEDICATIONS AT HOME:   Prior to Admission medications   Medication Sig Start Date End Date Taking? Authorizing Provider  buPROPion (WELLBUTRIN XL) 300 MG 24 hr tablet TAKE 1 TABLET BY MOUTH EVERY DAY Patient taking differently: Take 300 mg by mouth daily.  08/29/18  Yes Pucilowski, Olgierd A, MD  doxepin (SINEQUAN) 50 MG capsule Take 1 capsule (50 mg total) by mouth at bedtime. 09/27/18 12/26/18 Yes Pucilowski, Olgierd A, MD  hydrochlorothiazide (HYDRODIURIL) 25 MG tablet Take 1 tablet (25 mg total) by mouth daily. 09/16/18  Yes Thomas Morale, MD  lithium carbonate (LITHOBID) 300 MG CR tablet Take 2 tablets (600 mg total) by mouth at bedtime. Patient taking differently: Take 300 mg by mouth at bedtime.  09/27/18 12/26/18 Yes Pucilowski, Olgierd A, MD  losartan (COZAAR) 100 MG tablet TAKE 1 TABLET BY MOUTH EVERY DAY Patient taking differently: Take 100 mg by mouth daily.  11/08/18  Yes Thomas Morale, MD  OLANZapine (ZYPREXA) 5 MG tablet Take 1 tablet (5 mg total) by mouth at bedtime. 09/27/18  Yes Pucilowski, Olgierd A, MD  Omeprazole 20 MG TBEC Take 20 mg by mouth daily.    Yes [provider]  pravastatin (PRAVACHOL) 10 MG tablet TAKE 1 TABLET BY MOUTH EVERY DAY Patient taking differently: Take 10 mg by mouth daily.  11/08/18  Yes Thomas Morale, MD  valACYclovir (VALTREX) 1000 MG tablet Take 1 tablet (1,000 mg total) by mouth 2 (two) times daily as needed (fever blisters). 09/16/18  Yes Thomas Morale, MD  atorvastatin (LIPITOR) 40 MG tablet Take 1 tablet (40 mg total) by mouth daily. Patient not taking: Reported on 11/18/2018 09/22/18   Thomas Morale, MD  baclofen (LIORESAL) 10 MG tablet Take 1 tablet (10 mg total)  by mouth 3 (three) times daily. Patient not taking: Reported on 11/18/2018 07/13/18 07/13/19  Dara Hoyer, PA-C  metoprolol succinate (TOPROL-XL) 100 MG 24 hr tablet Take 1 tablet (100 mg total) by mouth daily. Take with or immediately following a meal. Patient not taking: Reported on 11/18/2018 09/07/18 12/06/18  Jerline Pain, MD      VITAL SIGNS:  Blood pressure (!) 158/104, pulse (!) 129, temperature 98 F (36.7 C), temperature source Oral, resp. rate 18, height 5\' 10"  (1.778 m), weight 90.7 kg, SpO2 100 %.  PHYSICAL EXAMINATION:  Physical Exam  GENERAL:  43 y.o.-year-old patient lying in the bed with no acute distress.  EYES: Pupils equal, round, reactive to light and accommodation. No scleral icterus. Extraocular muscles intact.  HEENT: Head atraumatic, normocephalic. Oropharynx and nasopharynx clear.  NECK:  Supple, no jugular venous distention. No thyroid enlargement, no tenderness.  LUNGS: Normal breath sounds bilaterally, no wheezing, rales,rhonchi or crepitation. No  use of accessory muscles of respiration.  CARDIOVASCULAR: S1, S2 normal. No murmurs, rubs, or gallops.  ABDOMEN: Soft, nontender, nondistended. Bowel sounds present. No organomegaly or mass.  EXTREMITIES: No pedal edema, cyanosis, or clubbing.  Mild tremors on extremities. NEUROLOGIC: Cranial nerves II through XII are intact. Muscle strength 5/5 in all extremities. Sensation intact. Gait not checked.  PSYCHIATRIC: The patient is alert and oriented x 3.  SKIN: No obvious rash, lesion, or ulcer.   LABORATORY PANEL:   CBC Recent Labs  Lab 11/18/18 0951  WBC 21.5*  HGB 13.5  HCT 39.3  PLT 461*   ------------------------------------------------------------------------------------------------------------------  Chemistries  Recent Labs  Lab 11/18/18 0951  NA 139  K 3.8  CL 91*  CO2 15*  GLUCOSE 157*  BUN 37*  CREATININE 3.23*  CALCIUM 9.4  MG 1.2*  AST 24  ALT 25  ALKPHOS 120  BILITOT 2.6*    ------------------------------------------------------------------------------------------------------------------  Cardiac Enzymes No results for input(s): TROPONINI in the last 168 hours. ------------------------------------------------------------------------------------------------------------------  RADIOLOGY:  Dg Chest 1 View  Result Date: 11/18/2018 CLINICAL DATA:  Vomiting EXAM: CHEST  1 VIEW COMPARISON:  02/19/2012 FINDINGS: Heart and mediastinal contours are within normal limits. No focal opacities or effusions. No acute bony abnormality. IMPRESSION: No active disease. Electronically Signed   By: Rolm Baptise M.D.   On: 11/18/2018 10:54      IMPRESSION AND PLAN:  Patient is a 43 year old male with history of bipolar disorder, alcohol abuse, hypertension and hepatitis C who presented to the emergency room with complaints of vomiting.  Patient apparently had stopped drinking about 2 weeks ago.  Last night patient went on a binge drinking by drinking an entire bottle of whiskey.  Diagnosed with alcoholic ketoacidosis and acute kidney injury.  1.  Alcoholic ketoacidosis Secondary to patient going on a binge drinking last night. Lactic acid level was initially 3.1.  With IV fluids repeat lactic acid level down to 1.9. Continue hydration.  2.  Alcohol abuse Patient had stopped drinking about 2 weeks ago before going on a binge drinking last night. Monitor closely for evidence of withdrawal since patient already having some mild tremors. Placed on CIWA protocol. Placed on thiamine, multivitamin and folic acid.  3.  Acute kidney injury Secondary to vomiting and decreased p.o. intake Continue IV fluid hydration.  Follow-up on renal function in a.m.  Renal ultrasound to rule out hydronephrosis. Nephrology consult placed.  4.  Elevated troponin of 323 No chest pain.  Twelve-lead EKG with sinus tach with old left bundle branch block. Suspect due to demand ischemia. Requested for  2D echocardiogram.  Cardiology consult placed.  5.  Hypomagnesemia Replaced in the emergency room.  Follow-up on repeat levels  6.  History of bipolar disorder Stable  7.  Hypertension Placed on clonidine. Holding off on resuming beta-blocker until urine drug screen back and no evidence of cocaine.  DVT prophylaxis; heparin  All the records are reviewed and case discussed with ED provider. Management plans discussed with the patient, family and they are in agreement. Updated patient's father present at bedside in the emergency room and treatment plans.  All questions were answered  CODE STATUS: Full code  TOTAL TIME TAKING CARE OF THIS PATIENT: 61 minutes.    Silver Achey M.D on 11/18/2018 at 3:07 PM  Between 7am to 6pm - Pager - [phone]  After 6pm go to [email or web address] - [facility]  Office  [phone]  CC: Primary care physician; Sarajane Jews,  Ishmael Holter, MD   Note: This dictation was prepared with Dragon dictation along with smaller phrase technology. Any transcriptional errors that result from this process are unintentional.

## 2018-11-18 NOTE — ED Notes (Signed)
Date and time results received: 11/18/18 11:07 AM   Test: Troponin Critical Value: 323  Name of Provider Notified: Jari Pigg

## 2018-11-18 NOTE — Progress Notes (Signed)
Per Dr. Jannifer Franklin give 2mg  of IV magnesium IVPB and 1g calcium gluconate IVPB. Ordering PRN metoprolol for increased HR.   Fuller Mandril, RN

## 2018-11-18 NOTE — ED Notes (Signed)
Critical lactic 3.1 reported to Dr. Jari Pigg

## 2018-11-18 NOTE — ED Provider Notes (Signed)
[facility] Emergency Department Provider Note  ____________________________________________   First MD Initiated Contact with Patient 11/18/18 [phone]     (approximate)  I have reviewed the triage vital signs and the nursing notes.   HISTORY  Chief Complaint Emesis    HPI Ritwik Lapinsky is a 43 y.o. male with bipolar, THC use, alcohol use who presents with elevated heart rate and emesis.  Patient endorses being a recovering alcoholic.  He last drank alcohol a week ago.  He said that last night he drank an entire bottle of whiskey.  He now has been having abdominal discomfort, that is moderate, intermittent, nothing makes better, nothing makes it worse associated with vomiting that is nonbloody nonbilious although occasionally red due to him drinking Gatorade prior to arrival.  Denies any fevers.          Past Medical History:  Diagnosis Date  . Alcohol use disorder, severe, in early remission ([place]) 03/30/2018  . Allergy   . Anxiety   . Bipolar disorder, in partial remission, most recent episode hypomanic ([place]) 03/30/2018  . Bleeding internal hemorrhoids 09/12/2013  . Cannabis dependence ([place]) 07/13/2018   Daily use  . Cervical disc disease 11/20/2014  . Chronic anxiety 07/13/2018  . Depression   . GERD (gastroesophageal reflux disease)   . Hernia, inguinal, bilateral 02/24/2011  . High ankle sprain of right lower extremity 01/16/2015  . Hx of hepatitis C 10/05/2017   -treated in 2019 -hepatology recommended no further surveillance needed except for LFTs with labs and see hepatologist if elevated  . Hypertension   . Lipids abnormal 09/29/2013    Patient Active Problem List   Diagnosis Date Noted  . Dyslipidemia 09/16/2018  . Fever blister 09/16/2018  . Cardiomyopathy, alcoholic ([place]) [id number]  . Left bundle branch block (LBBB) 09/16/2018  . Cannabis dependence ([place]) 07/13/2018  . Chronic anxiety 07/13/2018  . Bipolar disorder, in partial remission,  most recent episode hypomanic ([place]) 03/30/2018  . Alcohol use disorder, severe, in early remission ([place]) 03/30/2018  . Cervical disc disease 11/20/2014  . Hypertension 02/24/2011    Past Surgical History:  Procedure Laterality Date  . ANTERIOR CRUCIATE LIGAMENT REPAIR  2010  . INGUINAL HERNIA REPAIR  02/23/2012   Procedure: LAPAROSCOPIC BILATERAL INGUINAL HERNIA REPAIR;  Surgeon: Shann Medal, MD;  Location: [facility] ORS;  Service: General;  Laterality: Bilateral;  Laparoscopic Bilateral Inguinal Hernia Repair with mesh  . INSERTION OF MESH  02/23/2012   Procedure: INSERTION OF MESH;  Surgeon: Shann Medal, MD;  Location: [facility]S;  Service: General;  Laterality: N/A;  . NOSE SURGERY  [phone]    Prior to Admission medications   Medication Sig Start Date End Date Taking? Authorizing Provider  atorvastatin (LIPITOR) 40 MG tablet Take 1 tablet (40 mg total) by mouth daily. 09/22/18   [name], MD  baclofen (LIORESAL) 10 MG tablet Take 1 tablet (10 mg total) by mouth 3 (three) times daily. 07/13/18 07/13/19  Dara Hoyer, PA-C  buPROPion (WELLBUTRIN XL) 300 MG 24 hr tablet TAKE 1 TABLET BY MOUTH EVERY DAY 08/29/18   Pucilowski, Olgierd A, MD  doxepin (SINEQUAN) 50 MG capsule Take 1 capsule (50 mg total) by mouth at bedtime. 09/27/18 12/26/18  Pucilowski, Marchia Bond, MD  hydrochlorothiazide (HYDRODIURIL) 25 MG tablet Take 1 tablet (25 mg total) by mouth daily. 09/16/18   [name], MD  lithium carbonate (LITHOBID) 300 MG CR tablet Take 2 tablets (600 mg total) by mouth at  bedtime. 09/27/18 12/26/18  Pucilowski, Marchia Bond, MD  losartan (COZAAR) 100 MG tablet TAKE 1 TABLET BY MOUTH EVERY DAY 11/08/18   [name], MD  metoprolol succinate (TOPROL-XL) 100 MG 24 hr tablet Take 1 tablet (100 mg total) by mouth daily. Take with or immediately following a meal. 09/07/18 12/06/18  Jerline Pain, MD  OLANZapine (ZYPREXA) 5 MG tablet Take 1 tablet (5 mg total) by mouth at bedtime. 09/27/18    Pucilowski, Marchia Bond, MD  omeprazole (PRILOSEC) 20 MG capsule Take 20 mg by mouth daily.    [provider]  pravastatin (PRAVACHOL) 10 MG tablet TAKE 1 TABLET BY MOUTH EVERY DAY 11/08/18   [name], MD  valACYclovir (VALTREX) 1000 MG tablet Take 1 tablet (1,000 mg total) by mouth 2 (two) times daily as needed (fever blisters). 09/16/18   [name], MD    Allergies Hydrocodone  Family History  Problem Relation Age of Onset  . Hypertension Mother   . Atrial fibrillation Mother   . Hypertension Father   . Leukemia Father   . Prostate cancer Father   . Diabetes Brother     Social History Social History   Tobacco Use  . Smoking status: Never Smoker  . Smokeless tobacco: Never Used  Substance Use Topics  . Alcohol use: Not Currently    Alcohol/week: 8.0 standard drinks    Types: 8 Shots of liquor per week  . Drug use: Yes    Types: Marijuana    Comment: week ago      Review of Systems Constitutional: No fever/chills Eyes: No visual changes. ENT: No sore throat. Cardiovascular: Denies chest pain. Respiratory: Denies shortness of breath. Gastrointestinal: Positive abdominal pain, vomiting Genitourinary: Negative for dysuria. Musculoskeletal: Negative for back pain. Skin: Negative for rash. Neurological: Negative for headaches, focal weakness or numbness. All other ROS negative ____________________________________________   PHYSICAL EXAM:  VITAL SIGNS: Blood pressure (!) 135/127, pulse (!) 124, temperature 97.9 F (36.6 C), temperature source Oral, resp. rate (!) 23, height 5\' 10"  (1.778 m), weight 90.7 kg, SpO2 98 %.   Constitutional: Alert and oriented. Well appearing and in no acute distress. Eyes: Conjunctivae are normal. EOMI. Head: Atraumatic. Nose: No congestion/rhinnorhea. Mouth/Throat: Mucous membranes are moist.   Neck: No stridor. Trachea Midline. FROM Cardiovascula tachycardic.  Regular rhythm. Grossly normal heart sounds.  Good  peripheral circulation. Respiratory: Normal respiratory effort.  No retractions. Lungs CTAB. Gastrointestinal: Soft and nontender. No distention. No abdominal bruits.  Musculoskeletal: No lower extremity tenderness nor edema.  No joint effusions. Neurologic:  Normal speech and language. No gross focal neurologic deficits are appreciated.  Skin:  Skin is warm, dry and intact. No rash noted. Psychiatric: Mood and affect are normal. Speech and behavior are normal. GU: Deferred   ____________________________________________   LABS (all labs ordered are listed, but only abnormal results are displayed)  Labs Reviewed  CBC WITH DIFFERENTIAL/PLATELET - Abnormal; Notable for the following components:      Result Value   WBC 21.5 (*)    Platelets 461 (*)    Neutro Abs 17.9 (*)    Monocytes Absolute 1.4 (*)    Abs Immature Granulocytes 0.18 (*)    All other components within normal limits  COMPREHENSIVE METABOLIC PANEL - Abnormal; Notable for the following components:   Chloride 91 (*)    CO2 15 (*)    Glucose, Bld 157 (*)    BUN 37 (*)    Creatinine, Ser 3.23 (*)  Total Protein 9.8 (*)    Albumin 5.4 (*)    Total Bilirubin 2.6 (*)    GFR calc non Af Amer 22 (*)    GFR calc Af Amer 26 (*)    Anion gap 33 (*)    All other components within normal limits  MAGNESIUM - Abnormal; Notable for the following components:   Magnesium 1.2 (*)    All other components within normal limits  LACTIC ACID, PLASMA - Abnormal; Notable for the following components:   Lactic Acid, Venous 3.1 (*)    All other components within normal limits  BLOOD GAS, VENOUS - Abnormal; Notable for the following components:   pCO2, Ven 31 (*)    pO2, Ven 31.0 (*)    Acid-base deficit 2.7 (*)    All other components within normal limits  OSMOLALITY - Abnormal; Notable for the following components:   Osmolality 309 (*)    All other components within normal limits  BETA-HYDROXYBUTYRIC ACID - Abnormal; Notable for  the following components:   Beta-Hydroxybutyric Acid 5.38 (*)    All other components within normal limits  TROPONIN I (HIGH SENSITIVITY) - Abnormal; Notable for the following components:   Troponin I (High Sensitivity) 323 (*)    All other components within normal limits  TROPONIN I (HIGH SENSITIVITY) - Abnormal; Notable for the following components:   Troponin I (High Sensitivity) 312 (*)    All other components within normal limits  SARS CORONAVIRUS 2 (TAT 6-24 HRS)  LIPASE, BLOOD  ETHANOL  OSMOLALITY, URINE  CK  LACTIC ACID, PLASMA  HIV ANTIBODY (ROUTINE TESTING W REFLEX)  HIV4GL SAVE TUBE  MAGNESIUM  LACTIC ACID, PLASMA  URINE DRUG SCREEN, QUALITATIVE ([facility] ONLY)   ____________________________________________   ED ECG REPORT I, Vanessa Daniel, the attending physician, personally viewed and interpreted this ECG.  EKG is sinus tachycardia rate of 140, negative for scar Bosa criteria, T wave inversions in 1 to, 3, aVF, V5 through V6, left bundle branch block.  T wave inversions in the inferior leads are new from prior. ____________________________________________  RADIOLOGY Robert Bellow, personally viewed and evaluated these images (plain radiographs) as part of my medical decision making, as well as reviewing the written report by the radiologist.  ED MD interpretation: Chest x-ray without evidence of pneumonia or free air  Official radiology report(s): Dg Chest 1 View  Result Date: 11/18/2018 CLINICAL DATA:  Vomiting EXAM: CHEST  1 VIEW COMPARISON:  02/19/2012 FINDINGS: Heart and mediastinal contours are within normal limits. No focal opacities or effusions. No acute bony abnormality. IMPRESSION: No active disease. Electronically Signed   By: Rolm Baptise M.D.   On: 11/18/2018 10:54    ____________________________________________   PROCEDURES  Procedure(s) performed (including Critical Care):  Procedures   ____________________________________________    INITIAL IMPRESSION / ASSESSMENT AND PLAN / ED COURSE  Thomas Williamson was evaluated in Emergency Department on 11/18/2018 for the symptoms described in the history of present illness. He was evaluated in the context of the global COVID-19 pandemic, which necessitated consideration that the patient might be at risk for infection with the SARS-CoV-2 virus that causes COVID-19. Institutional protocols and algorithms that pertain to the evaluation of patients at risk for COVID-19 are in a state of rapid change based on information released by regulatory bodies including the CDC and federal and state organizations. These policies and algorithms were followed during the patient's care in the ED.    Patient presents with vomiting after alcohol binge.  Patient's abdomen is soft and nontender without any evidence of peritonitis.  Will hold off on CT imaging at this time.  Will get labs evaluate for dehydration, electrolyte abnormalities, AKI.  No crepitus felt on exam to suggest esophageal perforation but will get chest x-ray to screen for this.  Patient is also afebrile and well-appearing.    Labs are notable for significant new AKI, anion gap elevation.  White count was noted to be 22 most likely reactive from the vomiting.  Given patient is so well-appearing at this time will hold off on antibiotics but continue to closely monitor for fevers or additional issues.  Patient has elevated beta hydroxybutyrate with normal glucose and significantly elevated anion gap and lactate.  Is most likely consistent with alcoholic ketoacidosis.  Troponin was elevated to 300s but I suspect that this is demand secondary to the tachycardia.  We will continue to fluid resuscitate patient, dextrose, replace electrolytes.  Will admit patient to the medicine team for further management.    ____________________________________________   FINAL CLINICAL IMPRESSION(S) / ED DIAGNOSES   Final diagnoses:  Dehydration  Hypomagnesemia   Alcoholic ketoacidosis      MEDICATIONS GIVEN DURING THIS VISIT:  Medications  dextrose 5 % and 0.9% NaCl 5-0.9 % bolus 1,000 mL (has no administration in time range)  heparin injection 5,000 Units (has no administration in time range)  cloNIDine (CATAPRES) tablet 0.1 mg (has no administration in time range)  LORazepam (ATIVAN) tablet 1-4 mg ( Oral See Alternative 11/18/18 1259)    Or  LORazepam (ATIVAN) injection 1-4 mg (2 mg Intravenous Given 11/18/18 1259)  thiamine (VITAMIN B-1) tablet 100 mg (has no administration in time range)    Or  thiamine (B-1) injection 100 mg (has no administration in time range)  folic acid (FOLVITE) tablet 1 mg (has no administration in time range)  multivitamin with minerals tablet 1 tablet (has no administration in time range)  0.9 %  sodium chloride infusion (has no administration in time range)  hydrALAZINE (APRESOLINE) injection 10 mg (has no administration in time range)  sodium chloride 0.9 % bolus 1,000 mL (0 mLs Intravenous Stopped 11/18/18 1201)  ondansetron (ZOFRAN) injection 4 mg (4 mg Intravenous Given 11/18/18 0955)  haloperidol lactate (HALDOL) injection 2 mg (2 mg Intravenous Given 11/18/18 1040)  magnesium sulfate IVPB 2 g 50 mL (0 g Intravenous Stopped 11/18/18 1305)  thiamine (B-1) injection 100 mg (100 mg Intravenous Given 11/18/18 1202)  sodium chloride 0.9 % bolus 1,000 mL (1,000 mLs Intravenous Bolus 11/18/18 1232)     ED Discharge Orders    None       Note:  This document was prepared using Dragon voice recognition software and may include unintentional dictation errors.   Vanessa Sudan, MD 11/18/18 1329

## 2018-11-18 NOTE — Progress Notes (Signed)
Received call from [place]. Patient had a 19 beat run of VT. Patient in bed, AOx4, denies chest pain or heaviness. Alerting on call MD.   Fuller Mandril, RN

## 2018-11-18 NOTE — Consult Note (Signed)
Cardiology Consultation Note    Patient ID: Thomas Williamson, MRN: [id number], DOB/AGE: August 05, 1975 43 y.o. Admit date: 11/18/2018   Date of Consult: 11/18/2018 Primary Physician: [name], MD Primary Cardiologist: none  Chief Complaint: nausea and vomiting Reason for Consultation: elevated troponin Requesting MD: Dr. Stark Jock  HPI: Thomas Williamson is a 43 y.o. male with history of bipolar disorder, THC use, alcohol abuse who presented to the emergency room with abdominal discomfort, vomiting.  He denied chest pain or shortness of breath.  Chest x-ray showed no acute cardiopulmonary disease.  EKG showed sinus tachycardia with bundle branch block.  Patient had acute renal insufficiency with a creatinine of 3.23 up from a baseline of 1.03 2 months ago serum potassium 3.8 CO2 15.  Serum troponin draw per protocol showed a high-sensitivity troponin of 323 and 312.  Doing well currently with no chest pain.  Hemodynamic stable.  Creatinine has improved.  Elevated troponin appears to be secondary to renal dysfunction and demand.  No evidence of acute coronary syndrome.  Past Medical History:  Diagnosis Date  . Alcohol use disorder, severe, in early remission ([place]) 03/30/2018  . Allergy   . Anxiety   . Bipolar disorder, in partial remission, most recent episode hypomanic ([place]) 03/30/2018  . Bleeding internal hemorrhoids 09/12/2013  . Cannabis dependence ([place]) 07/13/2018   Daily use  . Cervical disc disease 11/20/2014  . Chronic anxiety 07/13/2018  . Depression   . GERD (gastroesophageal reflux disease)   . Hernia, inguinal, bilateral 02/24/2011  . High ankle sprain of right lower extremity 01/16/2015  . Hx of hepatitis C 10/05/2017   -treated in 2019 -hepatology recommended no further surveillance needed except for LFTs with labs and see hepatologist if elevated  . Hypertension   . Lipids abnormal 09/29/2013      Surgical History:  Past Surgical History:  Procedure Laterality Date  . ANTERIOR  CRUCIATE LIGAMENT REPAIR  2010  . INGUINAL HERNIA REPAIR  02/23/2012   Procedure: LAPAROSCOPIC BILATERAL INGUINAL HERNIA REPAIR;  Surgeon: Shann Medal, MD;  Location: [facility];  Service: General;  Laterality: Bilateral;  Laparoscopic Bilateral Inguinal Hernia Repair with mesh  . INSERTION OF MESH  02/23/2012   Procedure: INSERTION OF MESH;  Surgeon: Shann Medal, MD;  Location: [facility];  Service: General;  Laterality: N/A;  . NOSE SURGERY  [phone]     Home Meds: Prior to Admission medications   Medication Sig Start Date End Date Taking? Authorizing Provider  buPROPion (WELLBUTRIN XL) 300 MG 24 hr tablet TAKE 1 TABLET BY MOUTH EVERY DAY Patient taking differently: Take 300 mg by mouth daily.  08/29/18  Yes Pucilowski, Olgierd A, MD  doxepin (SINEQUAN) 50 MG capsule Take 1 capsule (50 mg total) by mouth at bedtime. 09/27/18 12/26/18 Yes Pucilowski, Olgierd A, MD  hydrochlorothiazide (HYDRODIURIL) 25 MG tablet Take 1 tablet (25 mg total) by mouth daily. 09/16/18  Yes [name], MD  lithium carbonate (LITHOBID) 300 MG CR tablet Take 2 tablets (600 mg total) by mouth at bedtime. Patient taking differently: Take 300 mg by mouth at bedtime.  09/27/18 12/26/18 Yes Pucilowski, Olgierd A, MD  losartan (COZAAR) 100 MG tablet TAKE 1 TABLET BY MOUTH EVERY DAY Patient taking differently: Take 100 mg by mouth daily.  11/08/18  Yes [name], MD  OLANZapine (ZYPREXA) 5 MG tablet Take 1 tablet (5 mg total) by mouth at bedtime. 09/27/18  Yes Pucilowski, Marchia Bond, MD  Omeprazole 20 MG TBEC Take  20 mg by mouth daily.    Yes [provider]  pravastatin (PRAVACHOL) 10 MG tablet TAKE 1 TABLET BY MOUTH EVERY DAY Patient taking differently: Take 10 mg by mouth daily.  11/08/18  Yes [name], MD  valACYclovir (VALTREX) 1000 MG tablet Take 1 tablet (1,000 mg total) by mouth 2 (two) times daily as needed (fever blisters). 09/16/18  Yes [name], MD  atorvastatin (LIPITOR) 40 MG tablet Take 1  tablet (40 mg total) by mouth daily. Patient not taking: Reported on 11/18/2018 09/22/18   [name], MD  baclofen (LIORESAL) 10 MG tablet Take 1 tablet (10 mg total) by mouth 3 (three) times daily. Patient not taking: Reported on 11/18/2018 07/13/18 07/13/19  Dara Hoyer, PA-C  metoprolol succinate (TOPROL-XL) 100 MG 24 hr tablet Take 1 tablet (100 mg total) by mouth daily. Take with or immediately following a meal. Patient not taking: Reported on 11/18/2018 09/07/18 12/06/18  Jerline Pain, MD    Inpatient Medications:  . cloNIDine  0.1 mg Oral TID  . folic acid  1 mg Oral Daily  . heparin  5,000 Units Subcutaneous Q8H  . multivitamin with minerals  1 tablet Oral Daily  . [START ON 11/19/2018] thiamine  100 mg Oral Daily   Or  . [START ON 11/19/2018] thiamine  100 mg Intravenous Daily   . sodium chloride    . dextrose 5 % and 0.9% NaCl      Allergies:  Allergies  Allergen Reactions  . Hydrocodone     UPSETS STOMACH    Social History   Socioeconomic History  . Marital status: Single    Spouse name: Not on file  . Number of children: 0  . Years of education: Not on file  . Highest education level: Bachelor's degree (e.g., BA, AB, BS)  Occupational History  . Not on file  Social Needs  . Financial resource strain: Somewhat hard  . Food insecurity    Worry: Sometimes true    Inability: Sometimes true  . Transportation needs    Medical: No    Non-medical: No  Tobacco Use  . Smoking status: Never Smoker  . Smokeless tobacco: Never Used  Substance and Sexual Activity  . Alcohol use: Not Currently    Alcohol/week: 8.0 standard drinks    Types: 8 Shots of liquor per week  . Drug use: Yes    Types: Marijuana    Comment: week ago  . Sexual activity: Not Currently  Lifestyle  . Physical activity    Days per week: 0 days    Minutes per session: 0 min  . Stress: Very much  Relationships  . Social Herbalist on phone: More than three times a week     Gets together: Twice a week    Attends religious service: Never    Active member of club or organization: Yes    Attends meetings of clubs or organizations: More than 4 times per year    Relationship status: Never married  . Intimate partner violence    Fear of current or ex partner: No    Emotionally abused: No    Physically abused: No    Forced sexual activity: No  Other Topics Concern  . 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 Hypertension Mother   . Atrial fibrillation Mother   . Hypertension Father   . Leukemia Father   . Prostate cancer Father   . Diabetes Brother      Review of Systems: A 12-system review of systems was performed and is negative except as noted in the HPI.  Labs: Recent Labs    11/18/18 1136  CKTOTAL 319   Lab Results  Component Value Date   WBC 21.5 (H) 11/18/2018   HGB 13.5 11/18/2018   HCT 39.3 11/18/2018   MCV 90.6 11/18/2018   PLT 461 (H) 11/18/2018    Recent Labs  Lab 11/18/18 0951  NA 139  K 3.8  CL 91*  CO2 15*  BUN 37*  CREATININE 3.23*  CALCIUM 9.4  PROT 9.8*  BILITOT 2.6*  ALKPHOS 120  ALT 25  AST 24  GLUCOSE 157*   Lab Results  Component Value Date   CHOL 241 (H) 09/19/2018   HDL 47.00 09/19/2018   TRIG 316.0 (H) 09/19/2018   No results found for: DDIMER  Radiology/Studies:  Dg Chest 1 View  Result Date: 11/18/2018 CLINICAL DATA:  Vomiting EXAM: CHEST  1 VIEW COMPARISON:  02/19/2012 FINDINGS: Heart and mediastinal contours are within normal limits. No focal opacities or effusions. No acute bony abnormality. IMPRESSION: No active disease. Electronically Signed   By: Rolm Baptise M.D.   On: 11/18/2018 10:54    Wt Readings from Last 3 Encounters:  11/18/18 90.7  kg  09/16/18 93.1 kg  09/05/18 92.1 kg    EKG: Sinus tachycardia with bundle branch block  Physical Exam:  Blood pressure (!) 147/127, pulse (!) 118, temperature 97.9 F (36.6 C), temperature source Oral, resp. rate 13, height 5\' 10"  (1.778 m), weight 90.7 kg, SpO2 98 %. Body mass index is 28.7 kg/m. General: Well developed, well nourished, in no acute distress. Head: Normocephalic, atraumatic, sclera non-icteric, no xanthomas, nares are without discharge.  Neck: Negative for carotid bruits. JVD not elevated. Lungs: Clear bilaterally to auscultation without wheezes, rales, or rhonchi. Breathing is unlabored. Heart: RRR with S1 S2. No murmurs, rubs, or gallops appreciated. Abdomen: Soft, non-tender, non-distended with normoactive bowel sounds. No hepatomegaly. No rebound/guarding. No obvious abdominal masses. Msk:  Strength and tone appear normal for age. Extremities: No clubbing or cyanosis. No edema.  Distal pedal pulses are 2+ and equal bilaterally. Neuro: Alert and oriented X 3. No facial asymmetry. No focal deficit. Moves all extremities spontaneously. Psych:  Responds to questions appropriately with a normal affect.     Assessment and Plan  Elevated troponin-appears to be secondary to stress as well as acute renal insufficiency.  No evidence of ischemia clinically or by electrocardiogram.  This does not appear to be secondary to acute coronary syndrome.  Had an echo in August.  Ejection fraction was 30 to 35% felt to be due to ethanol abuse.  No further inpatient cardiac work-up indicated.  Patient hemodynamically stable.  Would refrain from  alcohol use, tobacco abuse, substance abuse.  Would ambulate and consider discharge if stable will.  Will be glad to follow-up as outpatient if desired. Would continue with current meds including clonidine 0.1 to twice daily, start metoprolol tartrate 25 mg twice daily Signed, Teodoro Spray MD 11/18/2018, 1:07 PM Pager: [phone]

## 2018-11-18 NOTE — ED Triage Notes (Signed)
Pt sent to ER by [facility] for c/o emesis since last night.  Pt has remote hx of ETOH abuse and drank heavily yesterday.  Pt noted to be extremely tachycardic on arrival.

## 2018-11-19 LAB — BASIC METABOLIC PANEL
Anion gap: 12 (ref 5–15)
BUN: 26 mg/dL — ABNORMAL HIGH (ref 6–20)
CO2: 24 mmol/L (ref 22–32)
Calcium: 9.2 mg/dL (ref 8.9–10.3)
Chloride: 103 mmol/L (ref 98–111)
Creatinine, Ser: 1.22 mg/dL (ref 0.61–1.24)
GFR calc Af Amer: >60 mL/min (ref >60)
GFR calc non Af Amer: >60 mL/min (ref >60)
Glucose, Bld: 108 mg/dL — ABNORMAL HIGH (ref 70–99)
Potassium: 3.1 mmol/L — ABNORMAL LOW (ref 3.5–5.1)
Sodium: 139 mmol/L (ref 135–145)

## 2018-11-19 LAB — CBC
HCT: 31.4 % — ABNORMAL LOW (ref 39.0–52.0)
Hemoglobin: 10.7 g/dL — ABNORMAL LOW (ref 13.0–17.0)
MCH: 31.4 pg (ref 26.0–34.0)
MCHC: 34.1 g/dL (ref 30.0–36.0)
MCV: 92.1 fL (ref 80.0–100.0)
Platelets: 243 10*3/uL (ref 150–400)
RBC: 3.41 MIL/uL — ABNORMAL LOW (ref 4.22–5.81)
RDW: 13.7 % (ref 11.5–15.5)
WBC: 9.5 10*3/uL (ref 4.0–10.5)
nRBC: 0 % (ref 0.0–0.2)

## 2018-11-19 LAB — POTASSIUM: Potassium: 4 mmol/L (ref 3.5–5.1)

## 2018-11-19 LAB — SARS CORONAVIRUS 2 (TAT 6-24 HRS): SARS Coronavirus 2: NEGATIVE

## 2018-11-19 LAB — HIV ANTIBODY (ROUTINE TESTING W REFLEX): HIV Screen 4th Generation wRfx: NONREACTIVE

## 2018-11-19 LAB — PHOSPHORUS: Phosphorus: 1.9 mg/dL — ABNORMAL LOW (ref 2.5–4.6)

## 2018-11-19 LAB — MAGNESIUM: Magnesium: 2.6 mg/dL — ABNORMAL HIGH (ref 1.7–2.4)

## 2018-11-19 MED ORDER — POTASSIUM CHLORIDE CRYS ER 20 MEQ PO TBCR
40.0000 meq | EXTENDED_RELEASE_TABLET | Freq: Once | ORAL | Status: AC
  Administered 2018-11-19: 40 meq via ORAL
  Filled 2018-11-19: qty 2

## 2018-11-19 MED ORDER — METOPROLOL TARTRATE 25 MG PO TABS: 25.0000 mg | ORAL_TABLET | Freq: Two times a day (BID) | ORAL | 1 refills | Status: DC

## 2018-11-19 MED ORDER — METOPROLOL TARTRATE 25 MG PO TABS
25.0000 mg | ORAL_TABLET | Freq: Two times a day (BID) | ORAL | Status: DC
  Administered 2018-11-19: 25 mg via ORAL
  Filled 2018-11-19: qty 1

## 2018-11-19 NOTE — Progress Notes (Signed)
Discharge instructions reviewed with patient including followup visits and new medications.  Understanding was verbalized and all questions were answered.  IV removed without complication; patient tolerated well.  Patient discharged home ambulatory in stable condition. 

## 2018-11-19 NOTE — Discharge Summary (Signed)
[facility] NAME: Thomas Williamson    MR#:  [id number]  DATE OF BIRTH:  08/24/1975  DATE OF ADMISSION:  11/18/2018 ADMITTING PHYSICIAN: Otila Back, MD  DATE OF DISCHARGE: 11/19/2018  PRIMARY CARE PHYSICIAN: [name], MD    ADMISSION DIAGNOSIS:  Dehydration 99991111 Alcoholic ketoacidosis 99991111 Hypomagnesemia [E83.42] AKI (acute kidney injury) ([place]) [N17.9]  DISCHARGE DIAGNOSIS:  Active Problems:   Alcoholic ketoacidosis   SECONDARY DIAGNOSIS:   Past Medical History:  Diagnosis Date  . Alcohol use disorder, severe, in early remission ([place]) 03/30/2018  . Allergy   . Anxiety   . Bipolar disorder, in partial remission, most recent episode hypomanic ([place]) 03/30/2018  . Bleeding internal hemorrhoids 09/12/2013  . Cannabis dependence ([place]) 07/13/2018   Daily use  . Cervical disc disease 11/20/2014  . Chronic anxiety 07/13/2018  . Complication of anesthesia   . Depression   . GERD (gastroesophageal reflux disease)   . Hernia, inguinal, bilateral 02/24/2011  . High ankle sprain of right lower extremity 01/16/2015  . History of hiatal hernia   . Hx of hepatitis C 10/05/2017   -treated in 2019 -hepatology recommended no further surveillance needed except for LFTs with labs and see hepatologist if elevated  . Hypertension   . Lipids abnormal 09/29/2013  . Pneumonia   . PONV (postoperative nausea and vomiting)     HOSPITAL COURSE:   43 year old male with past medical history of hepatitis C, hypertension, depression, anxiety, cervical disc disease, cannabis dependence who presented to the hospital due to intractable nausea vomiting and noted to have acute kidney injury with alcoholic ketoacidosis.  1.  Alcoholic ketoacidosis-secondary to patient's binge drinking episode 2 days prior to admission. -Patient was aggressively hydrated with IV fluids and patient has clinically improved.  His lactate level has normalized.  He is  clinically stable and tolerating p.o. well.  2.  Acute kidney injury-secondary to the vomiting and poor p.o. intake. -Patient's renal ultrasound was negative for hydronephrosis.  Patient was aggressively hydrate with IV fluids and creatinine has improved and normalized now.  Patient is voiding well.  He is stable to be discharged.  3. Hypokalemia-secondary to the nausea vomiting, improved and resolved with supplementation.  Potassium on the day of discharge is 4.1.  4.  Elevated troponin-patient had a elevated high-sensitivity troponin.  Cardiology consult was obtained and as per them this was likely demand ischemia secondary to acute kidney injury.  There was no evidence of acute coronary syndrome.  Patient has no clinical symptoms.  Patient had a recent echocardiogram done which showed ejection fraction of 30 to 35% and cardiology did not recommend any repeat echocardiogram during this hospitalization.  Patient is clinically asymptomatic therefore being discharged home.  5.  History of bipolar disorder-patient will continue his Zyprexa, lithium.  6.  Essential hypertension-we will continue his HCTZ low-dose metoprolol.  7.  GERD-patient will continue omeprazole.  8.  Hyperlipidemia-patient will continue Pravachol.   DISCHARGE CONDITIONS:   Full code  CONSULTS OBTAINED:  Treatment Team:  Teodoro Spray, MD  DRUG ALLERGIES:   Allergies  Allergen Reactions  . Hydrocodone     UPSETS STOMACH    DISCHARGE MEDICATIONS:   Allergies as of 11/19/2018      Reactions   Hydrocodone    UPSETS STOMACH      Medication List    STOP taking these medications   atorvastatin 40 MG tablet Commonly known as: LIPITOR   baclofen 10  MG tablet Commonly known as: LIORESAL   metoprolol succinate 100 MG 24 hr tablet Commonly known as: TOPROL-XL     TAKE these medications   buPROPion 300 MG 24 hr tablet Commonly known as: WELLBUTRIN XL TAKE 1 TABLET BY MOUTH EVERY DAY   doxepin 50 MG  capsule Commonly known as: SINEQUAN Take 1 capsule (50 mg total) by mouth at bedtime.   hydrochlorothiazide 25 MG tablet Commonly known as: HYDRODIURIL Take 1 tablet (25 mg total) by mouth daily.   lithium carbonate 300 MG CR tablet Commonly known as: LITHOBID Take 2 tablets (600 mg total) by mouth at bedtime. What changed: how much to take   losartan 100 MG tablet Commonly known as: COZAAR TAKE 1 TABLET BY MOUTH EVERY DAY   metoprolol tartrate 25 MG tablet Commonly known as: LOPRESSOR Take 1 tablet (25 mg total) by mouth 2 (two) times daily.   OLANZapine 5 MG tablet Commonly known as: ZYPREXA Take 1 tablet (5 mg total) by mouth at bedtime.   Omeprazole 20 MG Tbec Take 20 mg by mouth daily.   pravastatin 10 MG tablet Commonly known as: PRAVACHOL TAKE 1 TABLET BY MOUTH EVERY DAY   valACYclovir 1000 MG tablet Commonly known as: Valtrex Take 1 tablet (1,000 mg total) by mouth 2 (two) times daily as needed (fever blisters).         DISCHARGE INSTRUCTIONS:   DIET:  Cardiac diet  DISCHARGE CONDITION:  Stable  ACTIVITY:  Activity as tolerated  OXYGEN:  Home Oxygen: No.   Oxygen Delivery: room air  DISCHARGE LOCATION:  home   If you experience worsening of your admission symptoms, develop shortness of breath, life threatening emergency, suicidal or homicidal thoughts you must seek medical attention immediately by calling 911 or calling your MD immediately  if symptoms less severe.  You Must read complete instructions/literature along with all the possible adverse reactions/side effects for all the Medicines you take and that have been prescribed to you. Take any new Medicines after you have completely understood and accpet all the possible adverse reactions/side effects.   Please note  You were cared for by a hospitalist during your hospital stay. If you have any questions about your discharge medications or the care you received while you were in the  hospital after you are discharged, you can call the unit and asked to speak with the hospitalist on call if the hospitalist that took care of you is not available. Once you are discharged, your primary care physician will handle any further medical issues. Please note that NO REFILLS for any discharge medications will be authorized once you are discharged, as it is imperative that you return to your primary care physician (or establish a relationship with a primary care physician if you do not have one) for your aftercare needs so that they can reassess your need for medications and monitor your lab values.     Today   No acute abdominal pain, N/V and tolerated PO well. Will d/c home today.  Potassium level has normalized.   VITAL SIGNS:  Blood pressure (!) 128/93, pulse 72, temperature 98.1 F (36.7 C), temperature source Oral, resp. rate 18, height 5\' 10"  (1.778 m), weight 90.7 kg, SpO2 99 %.  I/O:    Intake/Output Summary (Last 24 hours) at 11/19/2018 1502 Last data filed at 11/19/2018 1300 Gross per 24 hour  Intake 1841.91 ml  Output 650 ml  Net 1191.91 ml    PHYSICAL EXAMINATION:  GENERAL:  43  y.o.-year-old patient lying in the bed with no acute distress.  EYES: Pupils equal, round, reactive to light and accommodation. No scleral icterus. Extraocular muscles intact.  HEENT: Head atraumatic, normocephalic. Oropharynx and nasopharynx clear.  NECK:  Supple, no jugular venous distention. No thyroid enlargement, no tenderness.  LUNGS: Normal breath sounds bilaterally, no wheezing, rales,rhonchi. No use of accessory muscles of respiration.  CARDIOVASCULAR: S1, S2 normal. No murmurs, rubs, or gallops.  ABDOMEN: Soft, non-tender, non-distended. Bowel sounds present. No organomegaly or mass.  EXTREMITIES: No pedal edema, cyanosis, or clubbing.  NEUROLOGIC: Cranial nerves II through XII are intact. No focal motor or sensory defecits b/l.  PSYCHIATRIC: The patient is alert and oriented x  3.  SKIN: No obvious rash, lesion, or ulcer.   DATA REVIEW:   CBC Recent Labs  Lab 11/19/18 0425  WBC 9.5  HGB 10.7*  HCT 31.4*  PLT 243    Chemistries  Recent Labs  Lab 11/18/18 0951  11/19/18 0425 11/19/18 1401  NA 139  --  139  --   K 3.8  --  3.1* 4.0  CL 91*  --  103  --   CO2 15*  --  24  --   GLUCOSE 157*  --  108*  --   BUN 37*  --  26*  --   CREATININE 3.23*  --  1.22  --   CALCIUM 9.4  --  9.2  --   MG 1.2*   < > 2.6*  --   AST 24  --   --   --   ALT 25  --   --   --   ALKPHOS 120  --   --   --   BILITOT 2.6*  --   --   --    < > = values in this interval not displayed.    Cardiac Enzymes No results for input(s): TROPONINI in the last 168 hours.  Microbiology Results  Results for orders placed or performed during the hospital encounter of 11/18/18  SARS CORONAVIRUS 2 (TAT 6-24 HRS) Nasopharyngeal Nasopharyngeal Swab     Status: None   Collection Time: 11/18/18  1:02 PM   Specimen: Nasopharyngeal Swab  Result Value Ref Range Status   SARS Coronavirus 2 NEGATIVE NEGATIVE Final    Comment: (NOTE) SARS-CoV-2 target nucleic acids are NOT DETECTED. The SARS-CoV-2 RNA is generally detectable in upper and lower respiratory specimens during the acute phase of infection. Negative results do not preclude SARS-CoV-2 infection, do not rule out co-infections with other pathogens, and should not be used as the sole basis for treatment or other patient management decisions. Negative results must be combined with clinical observations, patient history, and epidemiological information. The expected result is Negative. Fact Sheet for Patients: SugarRoll.be Fact Sheet for Healthcare Providers: https://www.woods-mathews.com/ This test is not yet approved or cleared by the [place] FDA and  has been authorized for detection and/or diagnosis of SARS-CoV-2 by FDA under an Emergency Use Authorization (EUA). This EUA will  remain  in effect (meaning this test can be used) for the duration of the COVID-19 declaration under Section 56 4(b)(1) of the Act, 21 U.S.C. section 360bbb-3(b)(1), unless the authorization is terminated or revoked sooner. Performed at [facility], [place] [address], [place], [place] [address]     RADIOLOGY:  Dg Chest 1 View  Result Date: 11/18/2018 CLINICAL DATA:  Vomiting EXAM: CHEST  1 VIEW COMPARISON:  02/19/2012 FINDINGS: Heart and mediastinal contours are within  normal limits. No focal opacities or effusions. No acute bony abnormality. IMPRESSION: No active disease. Electronically Signed   By: Rolm Baptise M.D.   On: 11/18/2018 10:54   US Renal  Result Date: 11/18/2018 CLINICAL DATA:  Acute renal injury. EXAM: RENAL / URINARY TRACT ULTRASOUND COMPLETE COMPARISON:  Ultrasound 12/18/2016. FINDINGS: Right Kidney: Renal measurements: 11.1 x 5.0 x 6.0 cm = volume: 177 mL . Echogenicity within normal limits. No mass or hydronephrosis visualized. Left Kidney: Renal measurements: 12.3 x 4.8 x 6.0 cm = volume: 187 mL. Echogenicity within normal limits. No mass or hydronephrosis visualized. Bladder: Appears normal for degree of bladder distention. IMPRESSION: Normal exam. Electronically Signed   By: Marcello Moores  Register   On: 11/18/2018 15:54      Management plans discussed with the patient, family and they are in agreement.  CODE STATUS:     Code Status Orders  (From admission, onward)         Start     Ordered   11/18/18 1251  Full code  Continuous     11/18/18 1251        Code Status History    This patient has a current code status but no historical code status.   Advance Care Planning Activity      TOTAL TIME TAKING CARE OF THIS PATIENT: 40 minutes.    Henreitta Leber M.D on 11/19/2018 at 3:02 PM  Between 7am to 6pm - Pager - [phone]  After 6pm go to [email or web address] - Proofreader  [facility]  Office   [phone]  CC: Primary care physician; [name], MD

## 2018-11-22 ENCOUNTER — Encounter (HOSPITAL_COMMUNITY): Payer: Self-pay | Admitting: Licensed Clinical Social Worker

## 2018-11-22 ENCOUNTER — Other Ambulatory Visit: Payer: Self-pay

## 2018-11-22 ENCOUNTER — Ambulatory Visit (INDEPENDENT_AMBULATORY_CARE_PROVIDER_SITE_OTHER): Payer: BC Managed Care – PPO | Admitting: Licensed Clinical Social Worker

## 2018-11-22 ENCOUNTER — Ambulatory Visit (INDEPENDENT_AMBULATORY_CARE_PROVIDER_SITE_OTHER): Payer: BC Managed Care – PPO

## 2018-11-22 DIAGNOSIS — F1220 Cannabis dependence, uncomplicated: Secondary | ICD-10-CM

## 2018-11-22 DIAGNOSIS — F1020 Alcohol dependence, uncomplicated: Secondary | ICD-10-CM

## 2018-11-22 DIAGNOSIS — F321 Major depressive disorder, single episode, moderate: Secondary | ICD-10-CM | POA: Diagnosis not present

## 2018-11-22 DIAGNOSIS — F1021 Alcohol dependence, in remission: Secondary | ICD-10-CM

## 2018-11-22 DIAGNOSIS — F331 Major depressive disorder, recurrent, moderate: Secondary | ICD-10-CM | POA: Diagnosis not present

## 2018-11-22 MED ORDER — NALTREXONE 380 MG IM SUSR
380.0000 mg | INTRAMUSCULAR | Status: DC
  Administered 2018-11-22 – 2019-05-08 (×5): 380 mg via INTRAMUSCULAR

## 2018-11-22 NOTE — Progress Notes (Addendum)
Virtual Visit via Video Note  I connected with Trenda Moots on 11/22/18 at  9:00 AM EDT by a video enabled telemedicine application and verified that I am speaking with the correct person using two identifiers.   I discussed the limitations of evaluation and management by telemedicine and the availability of in person appointments. The patient expressed understanding and agreed to proceed.  History of Present Illness: Pt is referred to individual therapy after completion of CD-IOP for alcoholism. Pt has previously been to rehab facilities ([facility] and [name]).    Observations/Objective: Pt presented  Depressed for his webex individual therapy session.  Patient described his psychiatric symptoms and current life events. Patient reports he ended up in the ED over the weekend due to binge drinking which affected his heart and kidneys and dehydration. He was in the hospital 2 days. He is now back living with his parents. Patient and counselor developed a recovery plan: vivatrol shot, daily meetings, go early/stay late at meetings, find a sponsor, read his big book, work on gratitude and mindfulness skills. Pt was in agreement with recovery plan. Reviewed tx plan and patient verbalized his acceptance of the plan.  Assessment and Plan:Continue to see patient weekly assisting patient with maintaining a recovery plan.  Follow Up Instructions:    I discussed the assessment and treatment plan with the patient. The patient was provided an opportunity to ask questions and all were answered. The patient agreed with the plan and demonstrated an understanding of the instructions.   The patient was advised to call back or seek an in-person evaluation if the symptoms worsen or if the condition fails to improve as anticipated.  I provided 6040 minutes of non-face-to-face time during this encounter.   Orestes Geiman S, LCAS

## 2018-11-22 NOTE — Progress Notes (Signed)
Patient came in today to fill out his paperwork for Vivitrol, Dr. Montel Culver ordered. Patient presented blunted affect, patient states he is sober and has been since 11/18/2018. Vivitrol injection was prepared as ordered and administered in patients left upper outer gluteal region, patient tolerated well and without complaint and will return in one month for his next injection.

## 2018-11-29 ENCOUNTER — Ambulatory Visit (HOSPITAL_COMMUNITY): Payer: BC Managed Care – PPO | Admitting: Licensed Clinical Social Worker

## 2018-11-30 ENCOUNTER — Ambulatory Visit (INDEPENDENT_AMBULATORY_CARE_PROVIDER_SITE_OTHER): Payer: BC Managed Care – PPO | Admitting: Licensed Clinical Social Worker

## 2018-11-30 ENCOUNTER — Encounter (HOSPITAL_COMMUNITY): Payer: Self-pay | Admitting: Licensed Clinical Social Worker

## 2018-11-30 ENCOUNTER — Other Ambulatory Visit: Payer: Self-pay

## 2018-11-30 DIAGNOSIS — F1020 Alcohol dependence, uncomplicated: Secondary | ICD-10-CM

## 2018-11-30 DIAGNOSIS — F1220 Cannabis dependence, uncomplicated: Secondary | ICD-10-CM

## 2018-11-30 NOTE — Progress Notes (Signed)
Virtual Visit via Video Note  I connected with Thomas Williamson on 11/30/18 at  9:00 AM EDT by a video enabled telemedicine application and verified that I am speaking with the correct person using two identifiers.   I discussed the limitations of evaluation and management by telemedicine and the availability of in person appointments. The patient expressed understanding and agreed to proceed.  History of Present Illness: Pt is referred to individual therapy after completion of CD-IOP for alcoholism. Pt has previously been to rehab facilities ([place] [facility] and [name]).    Observations/Objective: Pt presented  Depressed for his webex individual therapy session.  Patient described his psychiatric symptoms and current life events. Patient is frustrated with his life: job, currently living with parents, $. Processed his low frustration tolerance. Patient reports he is attending meetings almost daily. Suggested to patient to attend zoom meetings when he's not able to attend an [place] meeting in person. Gave patient [employer] website for meetings.       Assessment and Plan:Continue to see patient weekly assisting patient with maintaining a recovery plan.  Follow Up Instructions:    I discussed the assessment and treatment plan with the patient. The patient was provided an opportunity to ask questions and all were answered. The patient agreed with the plan and demonstrated an understanding of the instructions.   The patient was advised to call back or seek an in-person evaluation if the symptoms worsen or if the condition fails to improve as anticipated.  I provided 45 minutes of non-face-to-face time during this encounter.   Jaisen Wiltrout S, LCAS

## 2018-12-02 ENCOUNTER — Telehealth (HOSPITAL_COMMUNITY): Payer: Self-pay

## 2018-12-02 NOTE — Telephone Encounter (Signed)
Sarah from [employer] called regarding patient's Vivitrol. She stated that it will be delivered on 12/21/18 at no charge.

## 2018-12-06 ENCOUNTER — Other Ambulatory Visit: Payer: Self-pay

## 2018-12-06 ENCOUNTER — Ambulatory Visit (INDEPENDENT_AMBULATORY_CARE_PROVIDER_SITE_OTHER): Payer: BC Managed Care – PPO | Admitting: Psychiatry

## 2018-12-06 DIAGNOSIS — F3171 Bipolar disorder, in partial remission, most recent episode hypomanic: Secondary | ICD-10-CM

## 2018-12-06 DIAGNOSIS — F1021 Alcohol dependence, in remission: Secondary | ICD-10-CM | POA: Diagnosis not present

## 2018-12-06 MED ORDER — DOXEPIN HCL 50 MG PO CAPS: 50.0000 mg | ORAL_CAPSULE | Freq: Every day | ORAL | 0 refills | Status: DC

## 2018-12-06 MED ORDER — LITHIUM CARBONATE ER 300 MG PO TBCR: 600.0000 mg | EXTENDED_RELEASE_TABLET | Freq: Every day | ORAL | 0 refills | Status: DC

## 2018-12-06 MED ORDER — BUPROPION HCL ER (XL) 300 MG PO TB24: 300.0000 mg | ORAL_TABLET | Freq: Every day | ORAL | 1 refills | Status: DC

## 2018-12-06 MED ORDER — OLANZAPINE 5 MG PO TABS: 5.0000 mg | ORAL_TABLET | Freq: Every day | ORAL | 0 refills | Status: DC

## 2018-12-06 NOTE — Progress Notes (Signed)
BH MD/PA/NP OP Progress Note  12/06/2018 10:40 AM Thomas Williamson  MRN:  [id number] Interview was conducted by phone and I verified that I was speaking with the correct person using two identifiers. I discussed the limitations of evaluation and management by telemedicine and  the availability of in person appointments. Patient expressed understanding and agreed to proceed.  Chief Complaint: Some depression.  HPI: 43yo male withBPAD and alcohol use disorder severe and bipolar disorder. He was twice in inpatient rehab ([facility] and in [name]). 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 He relapsed in October and want through acute withdrawal. He completed CD-IOP at [facility] and is now working individually with Alver Fisher. He started IM Vivitrol 380 mg on October 20th (sober since 11/18/18). Moodis mildly depressed, sleep and appetite good. He does not use doxepin often (for sleep). Lithium dose was increased to 300 mg bid while in [name] but now takes a whole dose at bedtime. He has complained of loss of libido and we decreased dose of Lexapro and eventually discontinued it. Wellbutrin was increased instead. He takesolanzapine prn anxiety, doxepin for insomnia, lithium for mood stabilization. He had genetic testing done a week ago.    Visit Diagnosis:    ICD-10-CM   1. Alcohol use disorder, severe, in early remission ([place])  F10.21   2. Bipolar disorder, in partial remission, most recent episode hypomanic ([facility])  F31.71 doxepin (SINEQUAN) 50 MG capsule    Past Psychiatric History: Please see intake H&P.  Past Medical History:  Past Medical History:  Diagnosis Date  . Alcohol use disorder, severe, in early remission ([place]) 03/30/2018  . Allergy   . Anxiety   . Bipolar disorder, in partial remission, most recent episode hypomanic ([place]) 03/30/2018  . Bleeding internal  hemorrhoids 09/12/2013  . Cannabis dependence ([place]) 07/13/2018   Daily use  . Cervical disc disease 11/20/2014  . Chronic anxiety 07/13/2018  . Complication of anesthesia   . Depression   . GERD (gastroesophageal reflux disease)   . Hernia, inguinal, bilateral 02/24/2011  . High ankle sprain of right lower extremity 01/16/2015  . History of hiatal hernia   . Hx of hepatitis C 10/05/2017   -treated in 2019 -hepatology recommended no further surveillance needed except for LFTs with labs and see hepatologist if elevated  . Hypertension   . Lipids abnormal 09/29/2013  . Pneumonia   . PONV (postoperative nausea and vomiting)     Past Surgical History:  Procedure Laterality Date  . ANTERIOR CRUCIATE LIGAMENT REPAIR  2010  . INGUINAL HERNIA REPAIR  02/23/2012   Procedure: LAPAROSCOPIC BILATERAL INGUINAL HERNIA REPAIR;  Surgeon: Shann Medal, MD;  Location: [facility];  Service: General;  Laterality: Bilateral;  Laparoscopic Bilateral Inguinal Hernia Repair with mesh  . INSERTION OF MESH  02/23/2012   Procedure: INSERTION OF MESH;  Surgeon: Shann Medal, MD;  Location: [facility];  Service: General;  Laterality: N/A;  . NOSE SURGERY  [id number]    Family Psychiatric History: None.  Family History:  Family History  Problem Relation Age of Onset  . Hypertension Mother   . Atrial fibrillation Mother   . Hypertension Father   . Leukemia Father   . Prostate cancer Father   . Diabetes Brother     Social History:  Social History   Socioeconomic History  . Marital status: Single  Spouse name: Not on file  . Number of children: 0  . Years of education: Not on file  . Highest education level: Bachelor's degree (e.g., BA, AB, BS)  Occupational History  . Not on file  Social Needs  . Financial resource strain: Somewhat hard  . Food insecurity    Worry: Sometimes true    Inability: Sometimes true  . Transportation needs    Medical: No    Non-medical: No  Tobacco Use  . Smoking status:  Never Smoker  . Smokeless tobacco: Never Used  Substance and Sexual Activity  . Alcohol use: Not Currently    Alcohol/week: 8.0 standard drinks    Types: 8 Shots of liquor per week    Comment: recovering alcoholic  . Drug use: Yes    Frequency: 3.0 times per week    Types: Marijuana    Comment: 5 days ago  . Sexual activity: Yes    Birth control/protection: Condom  Lifestyle  . Physical activity    Days per week: 0 days    Minutes per session: 0 min  . Stress: Very much  Relationships  . Social Herbalist on phone: More than three times a week    Gets together: Twice a week    Attends religious service: Never    Active member of club or organization: Yes    Attends meetings of clubs or organizations: More than 4 times per year    Relationship status: Never married  Other Topics Concern  . 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 Hydrocodone     UPSETS STOMACH    Metabolic Disorder Labs: No results found for: HGBA1C, MPG No results found for: PROLACTIN Lab Results  Component Value Date   CHOL 241 (H) 09/19/2018   TRIG 316.0 (H) 09/19/2018   HDL 47.00 09/19/2018   CHOLHDL 5 09/19/2018   VLDL 63.2 (H) 09/19/2018   Lab Results  Component Value Date   TSH 0.74 03/31/2016   TSH 0.48 09/20/2013    Therapeutic Level Labs: Lab Results  Component Value Date   LITHIUM 0.6 12/18/2016   No results found for: VALPROATE No components found for:  CBMZ  Current Medications: Current Outpatient Medications  Medication Sig Dispense Refill  . buPROPion (WELLBUTRIN XL) 300 MG 24 hr tablet Take 1 tablet (300 mg total) by mouth daily. 90 tablet 1  . doxepin (SINEQUAN) 50 MG capsule Take 1 capsule (50 mg total) by mouth at  bedtime. 90 capsule 0  . hydrochlorothiazide (HYDRODIURIL) 25 MG tablet Take 1 tablet (25 mg total) by mouth daily. 90 tablet 3  . lithium carbonate (LITHOBID) 300 MG CR tablet Take 2 tablets (600 mg total) by mouth at bedtime. 180 tablet 0  . losartan (COZAAR) 100 MG tablet TAKE 1 TABLET BY MOUTH EVERY DAY (Patient taking differently: Take 100 mg by mouth daily. ) 90 tablet 3  . metoprolol tartrate (LOPRESSOR) 25 MG tablet Take 1 tablet (25 mg total) by mouth 2 (two) times daily. 60 tablet 1  . OLANZapine (ZYPREXA) 5 MG tablet Take 1 tablet (5 mg total) by mouth at bedtime. [place]  tablet 0  . Omeprazole 20 MG TBEC Take 20 mg by mouth daily.     . pravastatin (PRAVACHOL) 10 MG tablet TAKE 1 TABLET BY MOUTH EVERY DAY (Patient taking differently: Take 10 mg by mouth daily. ) 90 tablet 3  . valACYclovir (VALTREX) 1000 MG tablet Take 1 tablet (1,000 mg total) by mouth 2 (two) times daily as needed (fever blisters). 60 tablet 5   Current Facility-Administered Medications  Medication Dose Route Frequency Provider Last Rate Last Dose  . Naltrexone SUSR 380 mg  380 mg Intramuscular Q30 days Erich Kochan A, MD   380 mg at 11/22/18 1624     Psychiatric Specialty Exam: Review of Systems  Psychiatric/Behavioral: Positive for depression.  All other systems reviewed and are negative.   There were no vitals taken for this visit.There is no height or weight on file to calculate BMI.  General Appearance: NA  Eye Contact:  NA  Speech:  Clear and Coherent and Normal Rate  Volume:  Normal  Mood:  Depressed  Affect:  NA  Thought Process:  Goal Directed and Linear  Orientation:  Full (Time, Place, and Person)  Thought Content: Logical   Suicidal Thoughts:  No  Homicidal Thoughts:  No  Memory:  Immediate;   Good Recent;   Good Remote;   Good  Judgement:  Fair  Insight:  Fair  Psychomotor Activity:  NA  Concentration:  Concentration: Good  Recall:  Good  Fund of Knowledge: Good  Language: Good   Akathisia:  Negative  Handed:  Right  AIMS (if indicated): not done  Assets:  Communication Skills Desire for Improvement Financial Resources/Insurance Housing Resilience Social Support  ADL's:  Intact  Cognition: WNL  Sleep:  Fair   Screenings: AUDIT     Counselor from 07/11/2018 in [place]  Alcohol Use Disorder Identification Test Final Score (AUDIT)  30    GAD-7     Counselor from 07/11/2018 in [place]  Total GAD-7 Score  9    PHQ2-9     Counselor from 07/11/2018 in [place]  PHQ-2 Total Score  2  PHQ-9 Total Score  8       Assessment and Plan: 43yo male withBPAD and alcohol use disorder severe and bipolar disorder. He was twice in inpatient rehab ([place] and in [name]). 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 He relapsed in October and want through acute withdrawal. He completed CD-IOP at [facility] and is now working individually with Alver Fisher. He started IM Vivitrol 380 mg on October 20th (sober since 11/18/18). Moodis mildly depressed, sleep and appetite good. He does not use doxepin often (for sleep). Lithium dose was increased to 300 mg bid while in [name] but now takes a whole dose at bedtime. He has complained of loss of libido and we decreased dose of Lexapro and eventually discontinued it. Wellbutrin was increased instead. He takesolanzapine prn anxiety, doxepin for insomnia, lithium for mood stabilization. He had genetic testing done a week ago.    Dx: Alcohol use disorder severe; BPAD, depressed mild  Plan:Continue Wellbutrin XL 300 mg, lithium CR 600 mg at HS, doxepin and olanzapine unchanged.We will get lithium level and TSH at the time of his next Vivitrol injection (November 20). Continue counseling with  [facility]. Return to clinic in two months.  The plan was discussed with patient who had an opportunity to ask questions and these were all answered. I spend 25 minutes in phone consultation with the patient.     Stephanie Acre, MD 12/06/2018, 10:40 AM

## 2018-12-07 ENCOUNTER — Encounter (HOSPITAL_COMMUNITY): Payer: Self-pay | Admitting: Licensed Clinical Social Worker

## 2018-12-07 ENCOUNTER — Other Ambulatory Visit: Payer: Self-pay

## 2018-12-07 ENCOUNTER — Ambulatory Visit (INDEPENDENT_AMBULATORY_CARE_PROVIDER_SITE_OTHER): Payer: BC Managed Care – PPO | Admitting: Licensed Clinical Social Worker

## 2018-12-07 DIAGNOSIS — F1220 Cannabis dependence, uncomplicated: Secondary | ICD-10-CM

## 2018-12-07 DIAGNOSIS — F1020 Alcohol dependence, uncomplicated: Secondary | ICD-10-CM

## 2018-12-07 NOTE — Progress Notes (Signed)
Virtual Visit via Video Note  I connected with Thomas Williamson on 12/07/18 at  9:00 AM EST by a video enabled telemedicine application and verified that I am speaking with the correct person using two identifiers.   I discussed the limitations of evaluation and management by telemedicine and the availability of in person appointments. The patient expressed understanding and agreed to proceed.  History of Present Illness: Pt is referred to individual therapy after completion of CD-IOP for alcoholism. Pt has previously been to rehab facilities ([place] and [name]).    Observations/Objective: Pt presented  Depressed and frustrated for his webex individual therapy session.  Patient described his psychiatric symptoms and current life events. Patient reports he met with his psychiatrist, Dr. Mamie Nick. He has not gotten the genetic tests back so did not adjust any medications yet. Encouraged patient to discuss his depressive symptoms with Dr. Mamie Nick. Patient continues feeling sorry for himself. Emailed him the 21 day gratitude journal and he has not reviewed it. Today emailed patient trigger worksheet and coping skills for addiction worksheet. Reviewed all worksheets with patient, beginning a discussion. Will continue working through worksheets at next session. Patient discussed his recovery plan and his search for a sponsor.       Assessment and Plan:Continue to see patient weekly assisting patient with maintaining a recovery plan.  Follow Up Instructions:    I discussed the assessment and treatment plan with the patient. The patient was provided an opportunity to ask questions and all were answered. The patient agreed with the plan and demonstrated an understanding of the instructions.   The patient was advised to call back or seek an in-person evaluation if the symptoms worsen or if the condition fails to improve as anticipated.  I provided 45 minutes of non-face-to-face time during this  encounter.   MACKENZIE,LISBETH S, LCAS

## 2018-12-09 ENCOUNTER — Other Ambulatory Visit (HOSPITAL_COMMUNITY): Payer: Self-pay | Admitting: Psychiatry

## 2018-12-09 DIAGNOSIS — F3175 Bipolar disorder, in partial remission, most recent episode depressed: Secondary | ICD-10-CM

## 2018-12-14 ENCOUNTER — Other Ambulatory Visit: Payer: Self-pay

## 2018-12-14 ENCOUNTER — Ambulatory Visit (INDEPENDENT_AMBULATORY_CARE_PROVIDER_SITE_OTHER): Payer: BC Managed Care – PPO | Admitting: Licensed Clinical Social Worker

## 2018-12-14 ENCOUNTER — Encounter (HOSPITAL_COMMUNITY): Payer: Self-pay | Admitting: Licensed Clinical Social Worker

## 2018-12-14 DIAGNOSIS — F1020 Alcohol dependence, uncomplicated: Secondary | ICD-10-CM | POA: Diagnosis not present

## 2018-12-14 NOTE — Progress Notes (Signed)
Virtual Visit via Video Note  I connected with Thomas Williamson on 12/14/18 at  9:00 AM EST by a video enabled telemedicine application and verified that I am speaking with the correct person using two identifiers.   I discussed the limitations of evaluation and management by telemedicine and the availability of in person appointments. The patient expressed understanding and agreed to proceed.  History of Present Illness: Pt is referred to individual therapy after completion of CD-IOP for alcoholism. Pt has previously been to rehab facilities ([place] and [name]).    Observations/Objective: Pt presented  depressed for his webex individual therapy session.  Patient described his psychiatric symptoms and current life events. Patient reports he has not gotten the genetic tests yet. Emailed CMA who will have Dr. Mamie Nick call patient Friday with results. Patient expressed interest in [place]; educated patient on [place] and suggested he research it and talk to Dr. Mamie Nick if interested.  Reviewed the 21 day gratitude journal and encouraged pt to begin the journal. Reviewed trigger worksheet and coping skills for addiction worksheet with patient and suggested patient complete the worksheets before next session. Encouraged patient to look for AA meetings in [place], now that he's staying there with is parents. Patient discussed his recovery plan and his search for a sponsor.       Assessment and Plan:Continue to see patient weekly assisting patient with maintaining a recovery plan.  Follow Up Instructions:    I discussed the assessment and treatment plan with the patient. The patient was provided an opportunity to ask questions and all were answered. The patient agreed with the plan and demonstrated an understanding of the instructions.   The patient was advised to call back or seek an in-person evaluation if the symptoms worsen or if the condition fails to improve as anticipated.  I provided 45 minutes of  non-face-to-face time during this encounter.   Benito Lemmerman S, LCAS

## 2018-12-15 DIAGNOSIS — M25562 Pain in left knee: Secondary | ICD-10-CM | POA: Diagnosis not present

## 2018-12-21 ENCOUNTER — Other Ambulatory Visit: Payer: Self-pay

## 2018-12-21 ENCOUNTER — Encounter (HOSPITAL_COMMUNITY): Payer: Self-pay | Admitting: Licensed Clinical Social Worker

## 2018-12-21 ENCOUNTER — Ambulatory Visit (INDEPENDENT_AMBULATORY_CARE_PROVIDER_SITE_OTHER): Payer: BC Managed Care – PPO | Admitting: Licensed Clinical Social Worker

## 2018-12-21 DIAGNOSIS — F1220 Cannabis dependence, uncomplicated: Secondary | ICD-10-CM | POA: Diagnosis not present

## 2018-12-21 DIAGNOSIS — F1020 Alcohol dependence, uncomplicated: Secondary | ICD-10-CM

## 2018-12-21 NOTE — Progress Notes (Signed)
Virtual Visit via Video Note  I connected with Thomas Williamson on 12/21/18 at  9:00 AM EST by a video enabled telemedicine application and verified that I am speaking with the correct person using two identifiers.   I discussed the limitations of evaluation and management by telemedicine and the availability of in person appointments. The patient expressed understanding and agreed to proceed.  History of Present Illness: Pt is referred to individual therapy after completion of CD-IOP for alcoholism. Pt has previously been to rehab facilities ([place] and [name]).    Observations/Objective: Pt presented  depressed for his webex individual therapy session.  Patient described his psychiatric symptoms and current life events. Patient still maintains he is depressed. Dr. Mamie Nick recommended trintellix as an additional medication, but his insurance doesn't cover it. Spoke with CMA Regan who is going to try to get it authorized. Patient discussed his recovery plan. Again, reviewed trigger worksheet where patient identified his triggers. Coached patient on Coping skills for triggers. Pt bought book, "Rewiring your brain" but hasn't started it. Pt began 21 days of gratitude.  PLAN: 21 days of gratitude, Rewiring your brain.    Assessment and Plan:Continue to see patient weekly assisting patient with maintaining a recovery plan.  Follow Up Instructions:    I discussed the assessment and treatment plan with the patient. The patient was provided an opportunity to ask questions and all were answered. The patient agreed with the plan and demonstrated an understanding of the instructions.   The patient was advised to call back or seek an in-person evaluation if the symptoms worsen or if the condition fails to improve as anticipated.  I provided 60 minutes of non-face-to-face time during this encounter.   Sha Amer S, LCAS

## 2018-12-28 ENCOUNTER — Ambulatory Visit (HOSPITAL_COMMUNITY): Payer: BC Managed Care – PPO | Admitting: Licensed Clinical Social Worker

## 2019-01-04 ENCOUNTER — Encounter (HOSPITAL_COMMUNITY): Payer: Self-pay | Admitting: *Deleted

## 2019-01-04 ENCOUNTER — Other Ambulatory Visit: Payer: Self-pay

## 2019-01-04 ENCOUNTER — Ambulatory Visit (INDEPENDENT_AMBULATORY_CARE_PROVIDER_SITE_OTHER): Payer: BC Managed Care – PPO | Admitting: *Deleted

## 2019-01-04 DIAGNOSIS — F1021 Alcohol dependence, in remission: Secondary | ICD-10-CM | POA: Diagnosis not present

## 2019-01-04 NOTE — Progress Notes (Signed)
Pt presented today for second injection of Vivitrol. Pt presents flat, depressed and anxious. Cooperative on approach. Injection given in left upper outer quadrant. Pt tolerated well. Pt instructed to call with any problems or c/o. Pt verbalizes understanding. Appointment made for 1 month.

## 2019-01-10 IMAGING — US US ABDOMEN COMPLETE W/ ELASTOGRAPHY
1 series · 13 of 25 positions shown · non-contrast
Comparison: CT 08/29/2010

CLINICAL DATA: Hepatitis-C



[Series 1: us abdomen complete w/ elastography · 0.15mm/px · 13 of 85 slices shown]
[im 1/85]
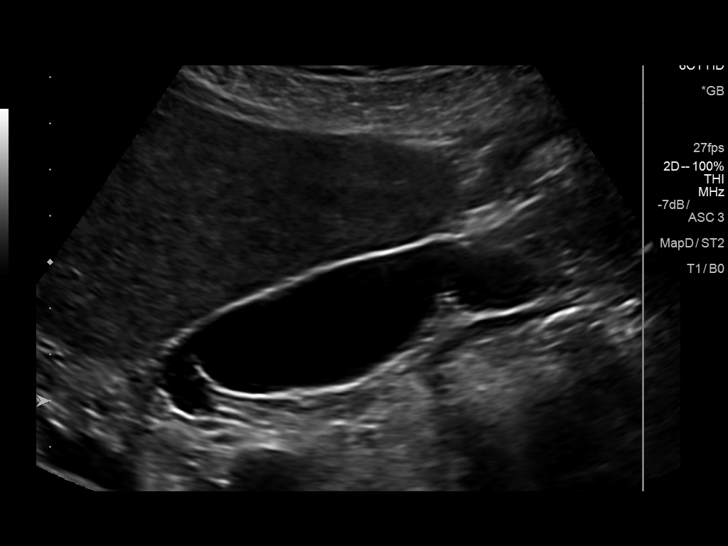
[im 8/85]
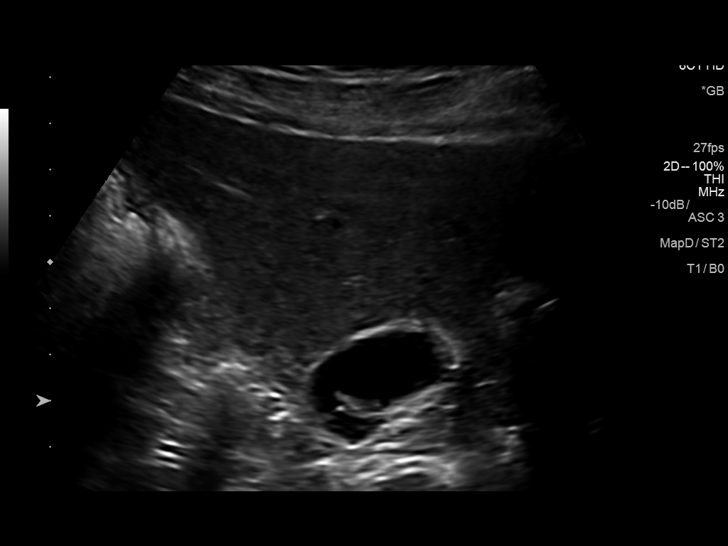
[im 15/85]
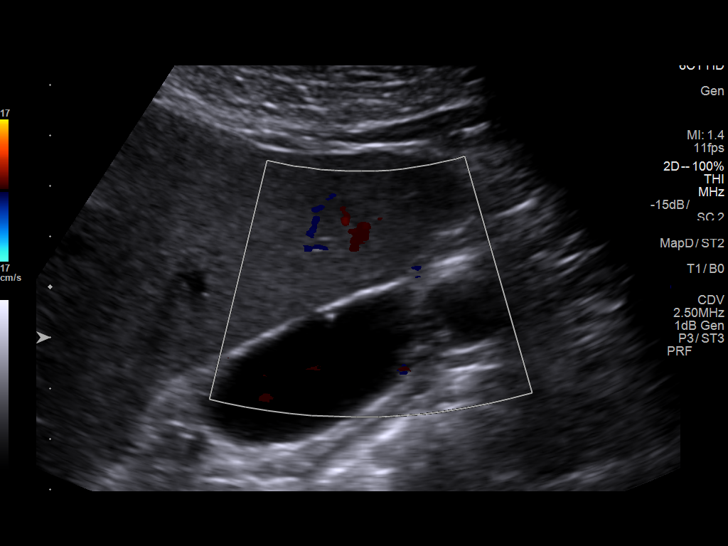
[im 22/85]
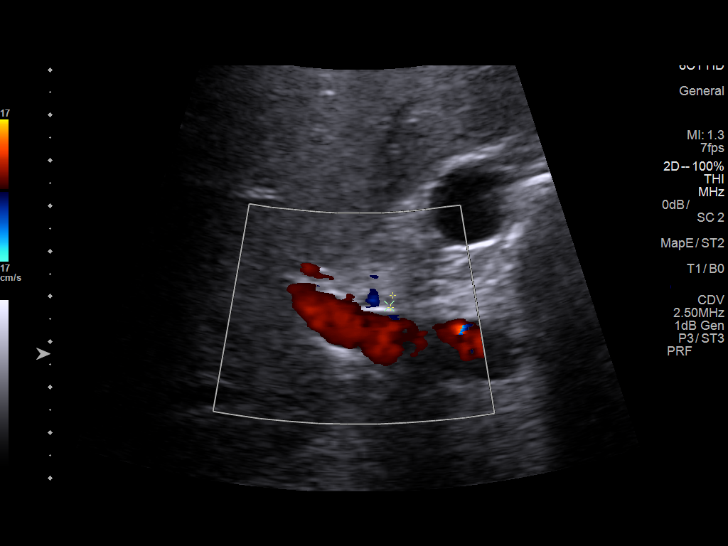
[im 29/85]
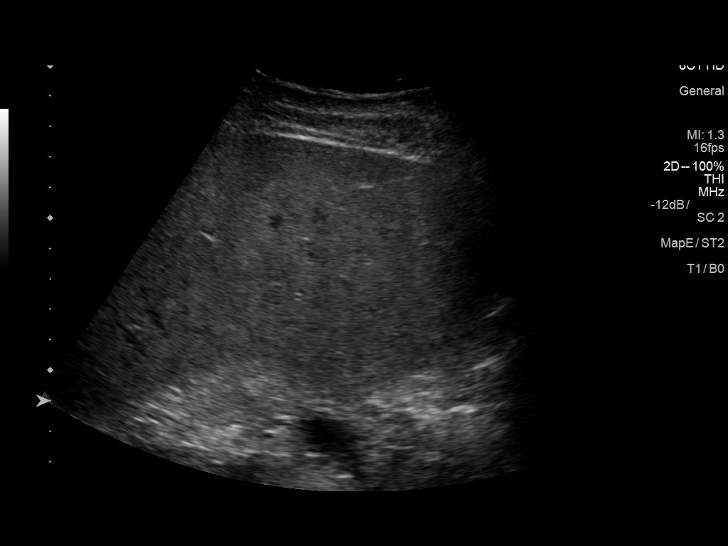
[im 36/85]
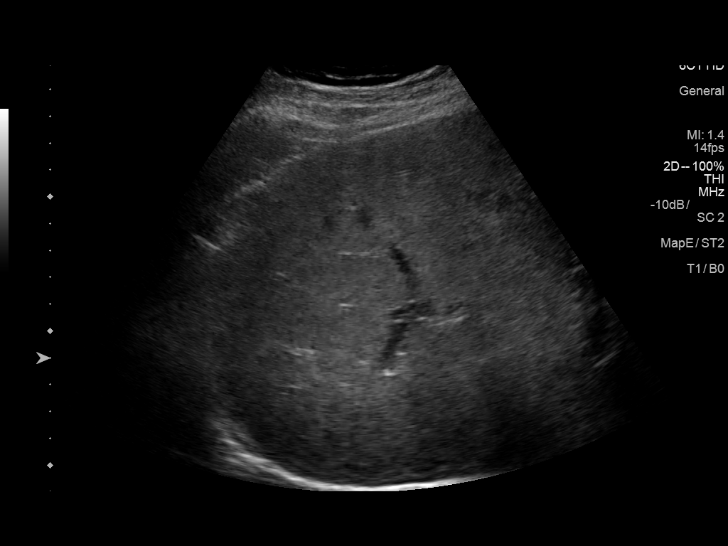
[im 43/85]
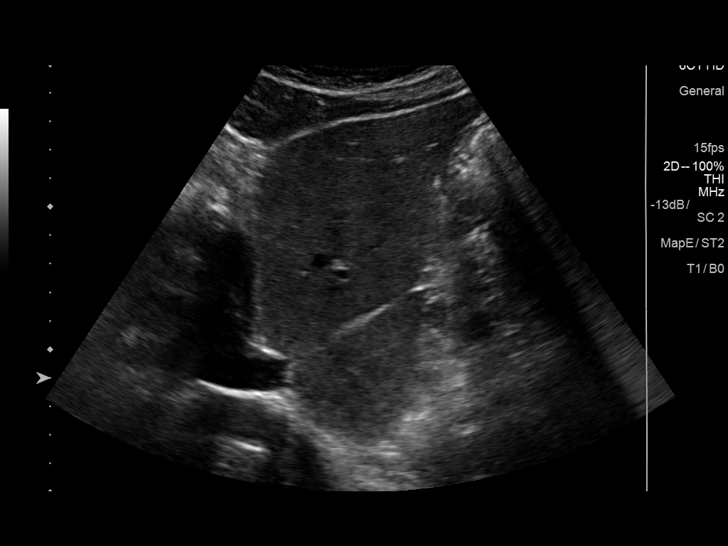
[im 50/85]
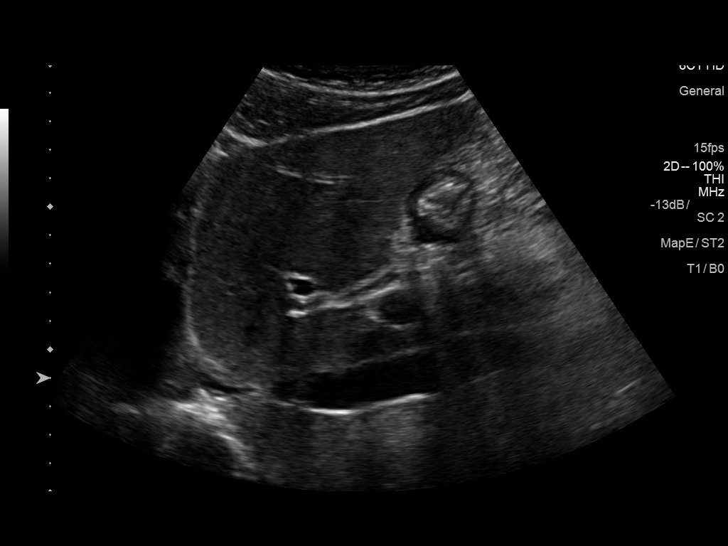
[im 57/85]
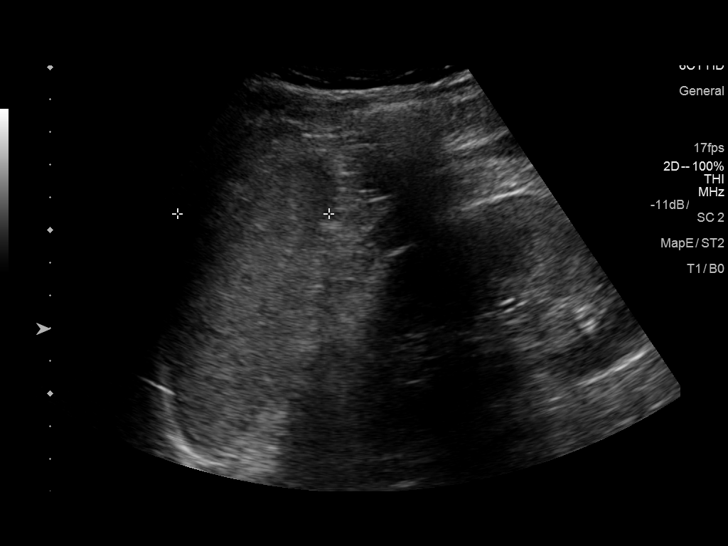
[im 64/85]
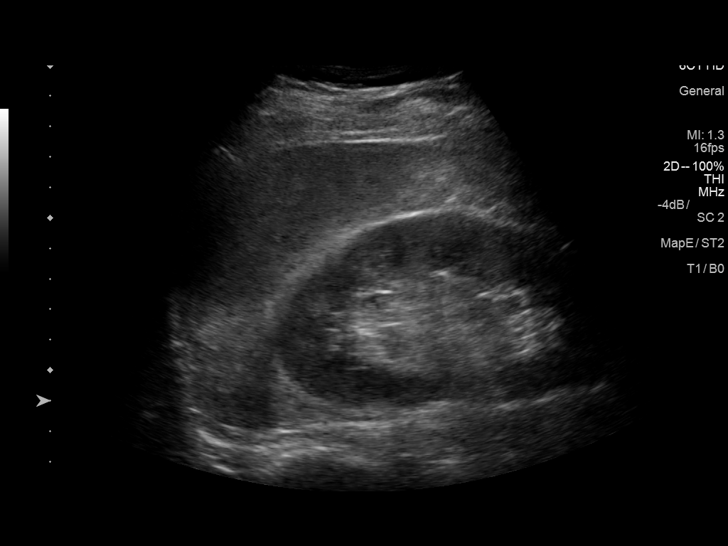
[im 71/85]
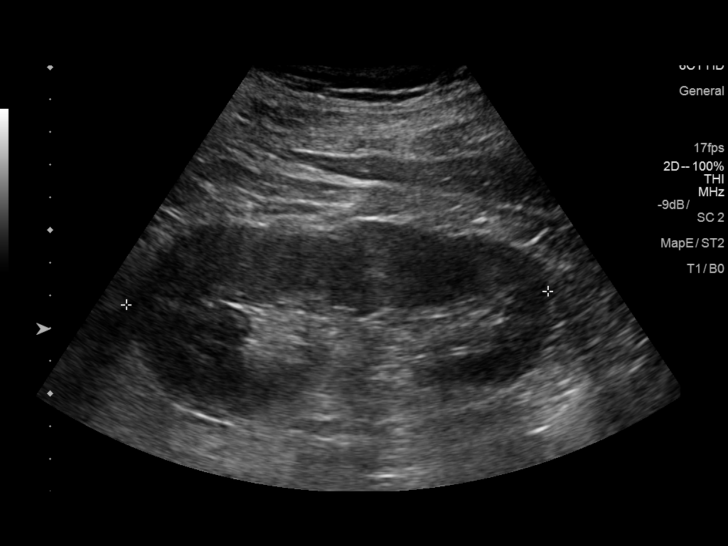
[im 78/85]
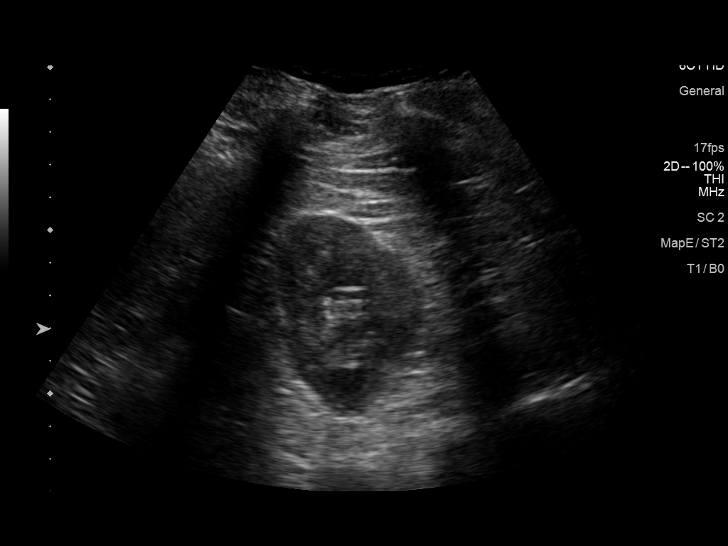
[im 85/85]
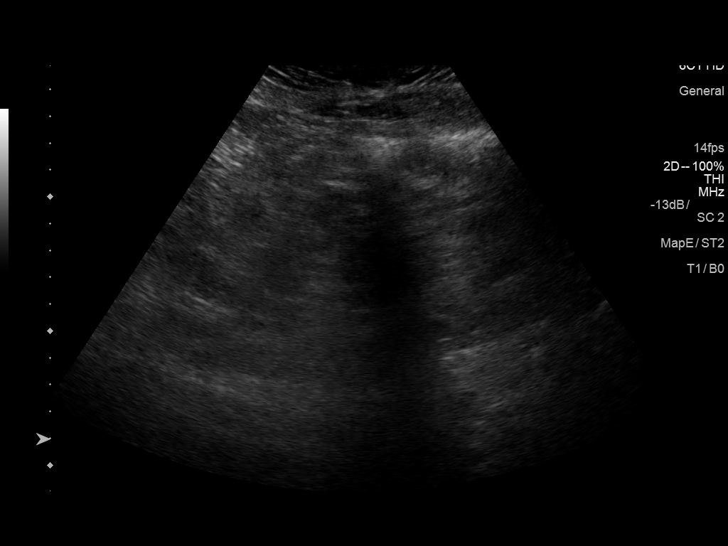

[13 of 25 positions shown; findings below may reference images not displayed]

FINDINGS: ULTRASOUND ABDOMEN

Gallbladder: 2 mm non mobile gallbladder wall polyp. No stones or
wall thickening.

Common bile duct: Diameter: Normal caliber, 3 mm.

Liver: No focal lesion identified. Within normal limits in
parenchymal echogenicity. Portal vein is patent on color Doppler
imaging with normal direction of blood flow towards the liver.

IVC: No abnormality visualized.

Pancreas: Visualized portion unremarkable.

Spleen: Size and appearance within normal limits.

Right Kidney: Length: 12.5 cm. Echogenicity within normal limits. No
mass or hydronephrosis visualized.

Left Kidney: Length: 12.9 cm. Echogenicity within normal limits. No
mass or hydronephrosis visualized.

Abdominal aorta: No aneurysm visualized.

Other findings: None.

ULTRASOUND HEPATIC ELASTOGRAPHY

Device: Siemens Helix VTQ

Patient position: Oblique

Transducer 6C1

Number of measurements: 10

Hepatic segment:  8

Median velocity:   1.19  m/sec

IQR:

IQR/Median velocity ratio:

Corresponding Metavir fibrosis score:  F0/F1

Risk of fibrosis: Minimal

Limitations of exam: None

Pertinent findings noted on other imaging exams:  None

Please note that abnormal shear wave velocities may also be
identified in clinical settings other than with hepatic fibrosis,
such as: acute hepatitis, elevated right heart and central venous
pressures including use of beta blockers, Quadir disease
([place]), infiltrative processes such as
mastocytosis/amyloidosis/infiltrative tumor, extrahepatic
cholestasis, in the post-prandial state, and liver transplantation.
Correlation with patient history, laboratory data, and clinical
condition recommended.
IMPRESSION: ULTRASOUND ABDOMEN:
Small gallbladder wall polyp. No stones or evidence of
cholecystitis.

ULTRASOUND HEPATIC ELASTOGRAPHY:

Median hepatic shear wave velocity is calculated at 1.19 m/sec.

Corresponding Metavir fibrosis score is  F0/F1.

Risk of fibrosis is Minimal.

Follow-up: None required

## 2019-01-10 IMAGING — US US ABDOMEN COMPLETE W/ ELASTOGRAPHY
1 series · 12 of 12 positions shown · non-contrast
Comparison: CT 08/29/2010

CLINICAL DATA: Hepatitis-C



[Series 1: us abdomen complete w/ elastography · 0.15mm/px · 12 of 12 slices shown]
[im 1/12]
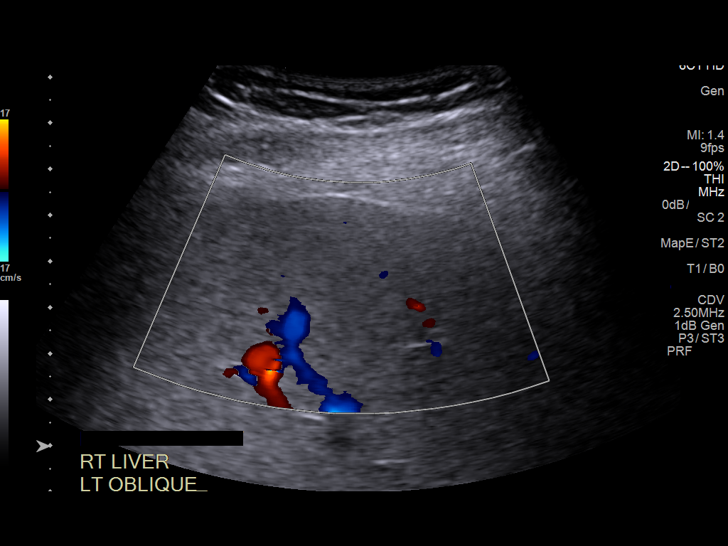
[im 2/12]
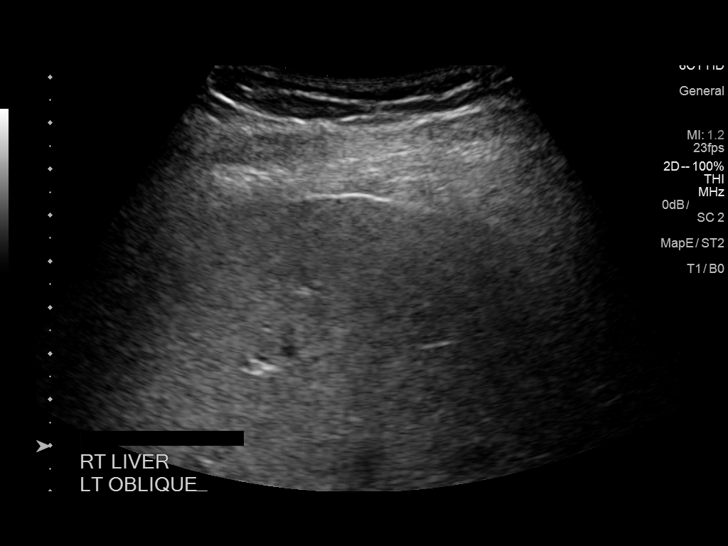
[im 3/12]
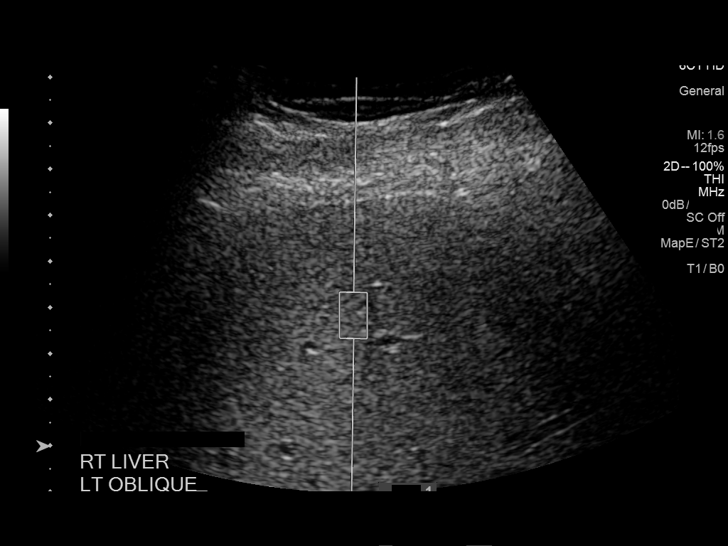
[im 4/12]
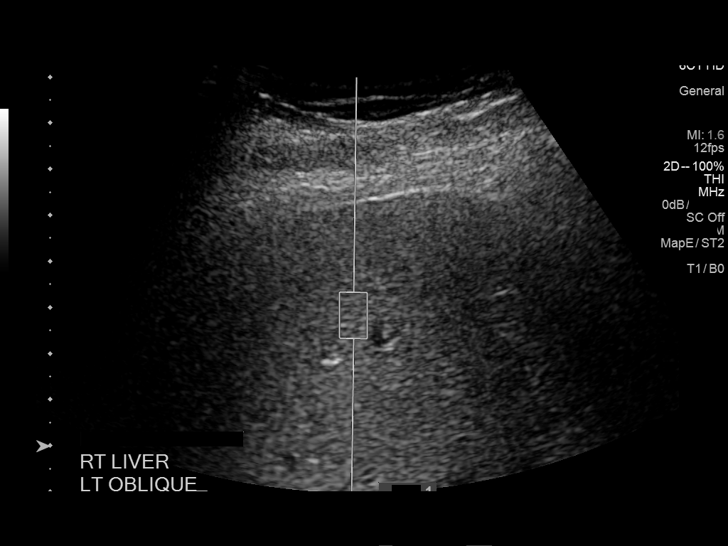
[im 5/12]
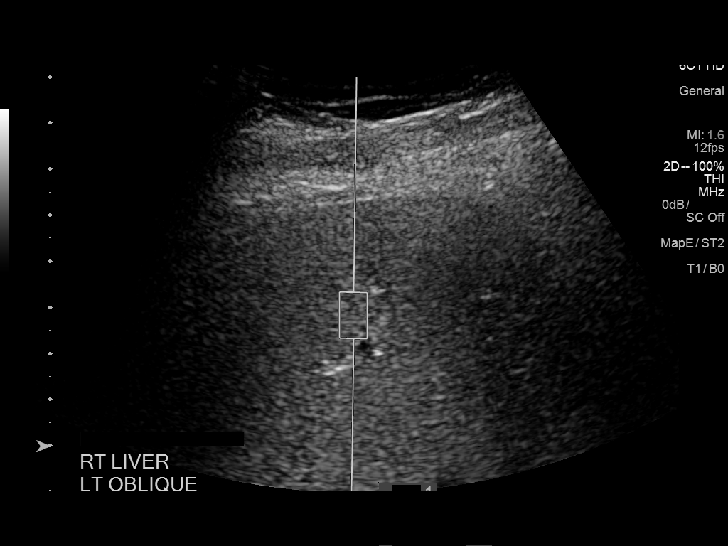
[im 6/12]
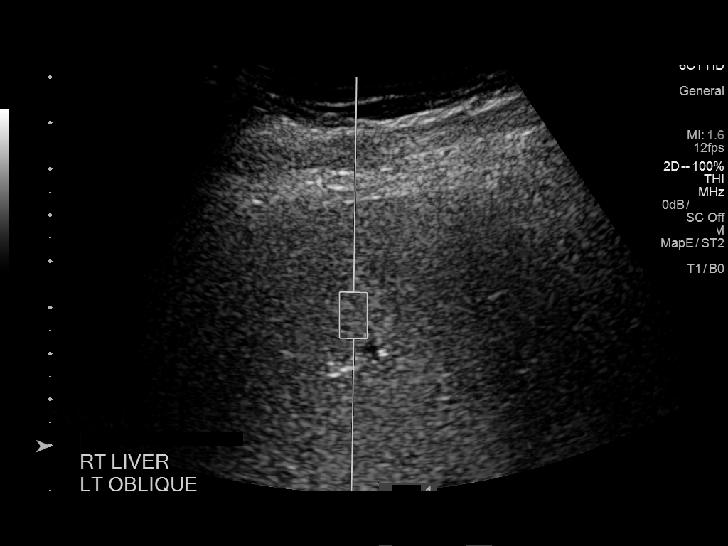
[im 7/12]
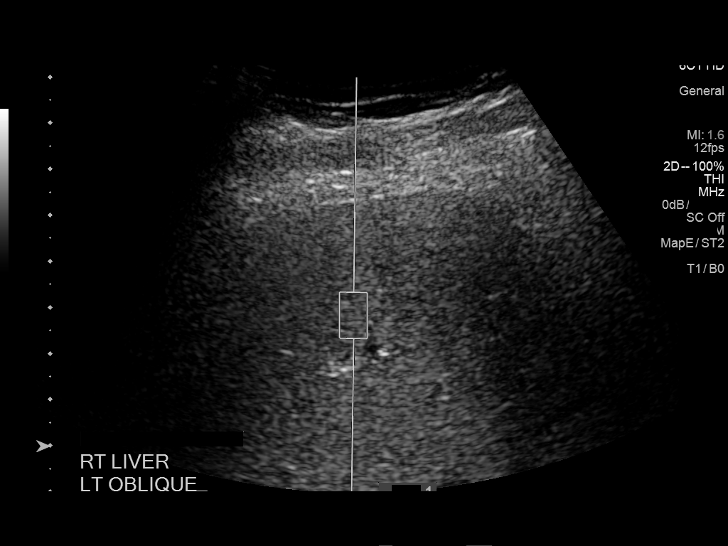
[im 8/12]
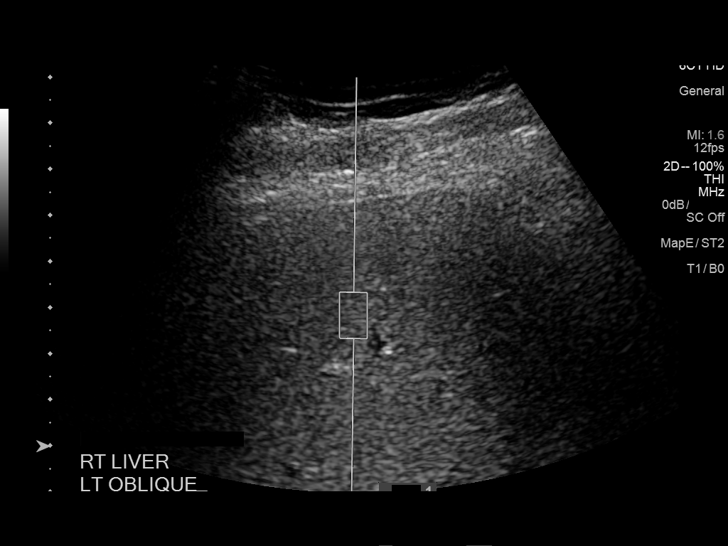
[im 9/12]
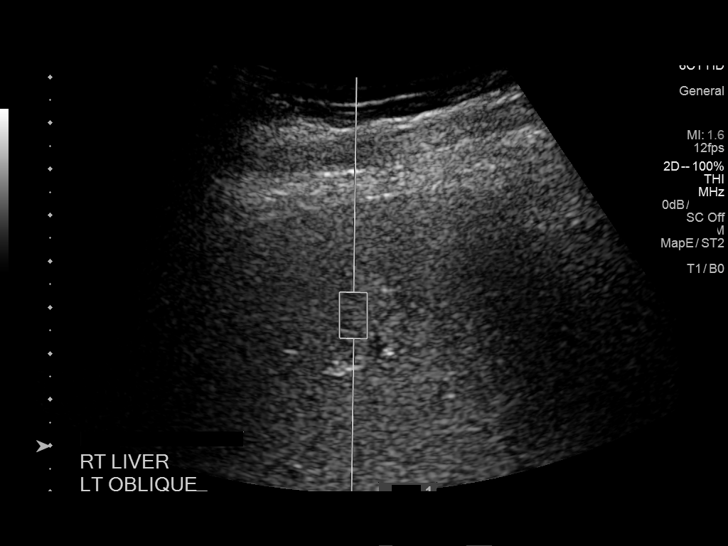
[im 10/12]
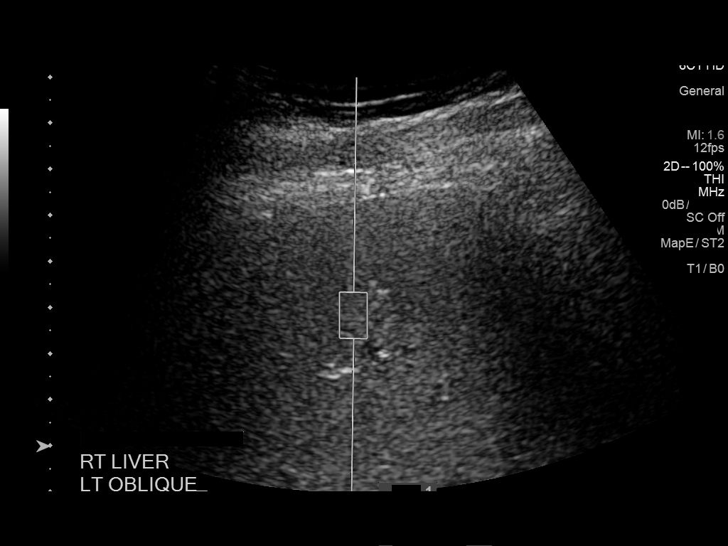
[im 11/12]
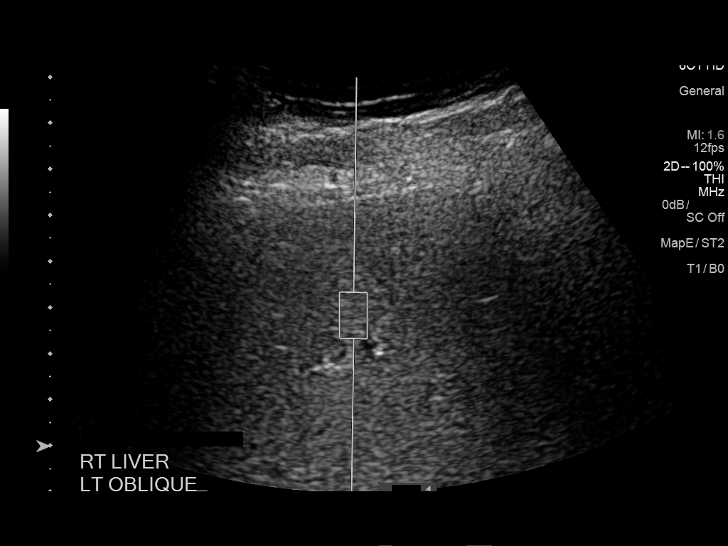
[im 12/12]
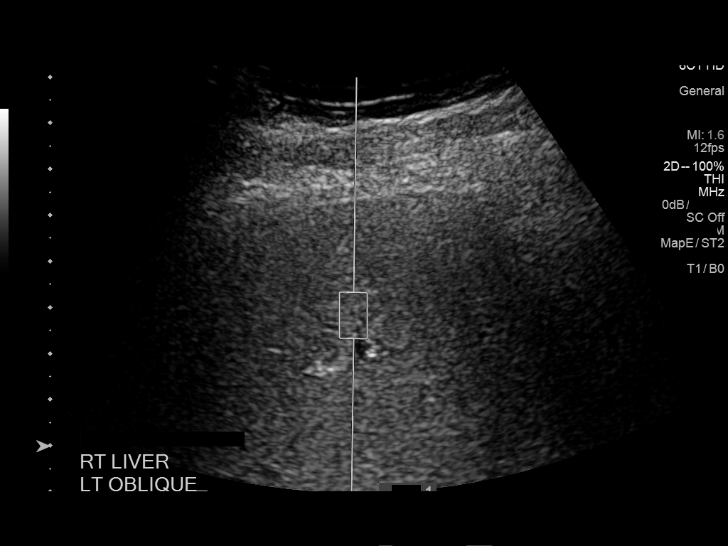

[12 of 12 positions shown; findings below may reference images not displayed]

FINDINGS: ULTRASOUND ABDOMEN

Gallbladder: 2 mm non mobile gallbladder wall polyp. No stones or
wall thickening.

Common bile duct: Diameter: Normal caliber, 3 mm.

Liver: No focal lesion identified. Within normal limits in
parenchymal echogenicity. Portal vein is patent on color Doppler
imaging with normal direction of blood flow towards the liver.

IVC: No abnormality visualized.

Pancreas: Visualized portion unremarkable.

Spleen: Size and appearance within normal limits.

Right Kidney: Length: 12.5 cm. Echogenicity within normal limits. No
mass or hydronephrosis visualized.

Left Kidney: Length: 12.9 cm. Echogenicity within normal limits. No
mass or hydronephrosis visualized.

Abdominal aorta: No aneurysm visualized.

Other findings: None.

ULTRASOUND HEPATIC ELASTOGRAPHY

Device: Siemens Helix VTQ

Patient position: Oblique

Transducer 6C1

Number of measurements: 10

Hepatic segment:  8

Median velocity:   1.19  m/sec

IQR:

IQR/Median velocity ratio:

Corresponding Metavir fibrosis score:  F0/F1

Risk of fibrosis: Minimal

Limitations of exam: None

Pertinent findings noted on other imaging exams:  None

Please note that abnormal shear wave velocities may also be
identified in clinical settings other than with hepatic fibrosis,
such as: acute hepatitis, elevated right heart and central venous
pressures including use of beta blockers, Quadir disease
([place]), infiltrative processes such as
mastocytosis/amyloidosis/infiltrative tumor, extrahepatic
cholestasis, in the post-prandial state, and liver transplantation.
Correlation with patient history, laboratory data, and clinical
condition recommended.
IMPRESSION: ULTRASOUND ABDOMEN:
Small gallbladder wall polyp. No stones or evidence of
cholecystitis.

ULTRASOUND HEPATIC ELASTOGRAPHY:

Median hepatic shear wave velocity is calculated at 1.19 m/sec.

Corresponding Metavir fibrosis score is  F0/F1.

Risk of fibrosis is Minimal.

Follow-up: None required

## 2019-02-07 ENCOUNTER — Other Ambulatory Visit: Payer: Self-pay

## 2019-02-07 ENCOUNTER — Ambulatory Visit (INDEPENDENT_AMBULATORY_CARE_PROVIDER_SITE_OTHER): Payer: BC Managed Care – PPO | Admitting: Psychiatry

## 2019-02-07 ENCOUNTER — Encounter (HOSPITAL_COMMUNITY): Payer: Self-pay | Admitting: Psychiatry

## 2019-02-07 DIAGNOSIS — F3132 Bipolar disorder, current episode depressed, moderate: Secondary | ICD-10-CM | POA: Diagnosis not present

## 2019-02-07 DIAGNOSIS — F419 Anxiety disorder, unspecified: Secondary | ICD-10-CM

## 2019-02-07 DIAGNOSIS — F1021 Alcohol dependence, in remission: Secondary | ICD-10-CM

## 2019-02-07 MED ORDER — LURASIDONE HCL 40 MG PO TABS: 40.0000 mg | ORAL_TABLET | Freq: Every day | ORAL | 1 refills | Status: DC

## 2019-02-07 MED ORDER — BUPROPION HCL ER (XL) 300 MG PO TB24: 300.0000 mg | ORAL_TABLET | Freq: Every day | ORAL | 1 refills | Status: DC

## 2019-02-07 MED ORDER — OLANZAPINE 5 MG PO TABS: 5.0000 mg | ORAL_TABLET | ORAL | 0 refills | Status: DC | PRN

## 2019-02-07 MED ORDER — LITHIUM CARBONATE ER 300 MG PO TBCR: 600.0000 mg | EXTENDED_RELEASE_TABLET | Freq: Every day | ORAL | 0 refills | Status: DC

## 2019-02-07 NOTE — Progress Notes (Signed)
BH MD/PA/NP OP Progress Note  02/07/2019 10:45 AM Thomas Williamson  MRN:  [id number] Interview was conducted by phone and I verified that I was speaking with the correct person using two identifiers. I discussed the limitations of evaluation and management by telemedicine and  the availability of in person appointments. Patient expressed understanding and agreed to proceed.  Chief Complaint: Depressed mood.  HPI: 44yo male withBPAD and alcohol use disorder severe and bipolar disorder. He was twice in inpatient rehab ([place] and in [name]). 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 He completed [facility]OP at [facility] is now working individually with [employer]. He started IM Vivitrol 380 mg on October 20th (sober since 11/18/18) but decided not to continue after three months. He now lives with his parents and drinks only occasionally, not losing control. Moodremains depressed and mildly anxious. Sleep and appetite are good. He does not use doxepin often (for sleep). Lithium dose was increased to 300 mg bid [place] in [place] now takes a whole dose at bedtime.He has complained of loss of libido and we decreased dose of Lexapro and eventually discontinued it. Wellbutrin was increased instead.He takesolanzapine prn anxiety, doxepin for insomnia, lithium for mood stabilization. He had genetic testing done. He plans now to go to a Art gallery manager school.  Visit Diagnosis:    ICD-10-CM   1. Alcohol use disorder, severe, in early remission ([place])  F10.21   2. Bipolar 1 disorder, depressed, moderate ([place])  F31.32   3. Chronic anxiety  F41.9     Past Psychiatric History: Please see intake H&P.  Past Medical History:  Past Medical History:  Diagnosis Date  . Alcohol use disorder, severe, in early remission ([place]) 03/30/2018  .  Allergy   . Anxiety   . Bipolar disorder, in partial remission, most recent episode hypomanic ([place]) 03/30/2018  . Bleeding internal hemorrhoids 09/12/2013  . Cannabis dependence ([place]) 07/13/2018   Daily use  . Cervical disc disease 11/20/2014  . Chronic anxiety 07/13/2018  . Complication of anesthesia   . Depression   . GERD (gastroesophageal reflux disease)   . Hernia, inguinal, bilateral 02/24/2011  . High ankle sprain of right lower extremity 01/16/2015  . History of hiatal hernia   . Hx of hepatitis C 10/05/2017   -treated in 2019 -hepatology recommended no further surveillance needed except for LFTs with labs and see hepatologist if elevated  . Hypertension   . Lipids abnormal 09/29/2013  . Pneumonia   . PONV (postoperative nausea and vomiting)     Past Surgical History:  Procedure Laterality Date  . ANTERIOR CRUCIATE LIGAMENT REPAIR  2010  . INGUINAL HERNIA REPAIR  02/23/2012   Procedure: LAPAROSCOPIC BILATERAL INGUINAL HERNIA REPAIR;  Surgeon: Shann Medal, MD;  Location: [facility];  Service: General;  Laterality: Bilateral;  Laparoscopic Bilateral Inguinal Hernia Repair with mesh  . INSERTION OF MESH  02/23/2012   Procedure: INSERTION OF MESH;  Surgeon: Shann Medal, MD;  Location: [facility];  Service: General;  Laterality: N/A;  . NOSE SURGERY  [phone]    Family Psychiatric History: None  Family History:  Family History  Problem Relation Age of Onset  . Hypertension Mother   . Atrial fibrillation Mother   . Hypertension Father   . Leukemia Father   . Prostate cancer Father   . Diabetes  Brother     Social History:  Social History   Socioeconomic History  . Marital status: Single    Spouse name: Not on file  . Number of children: 0  . Years of education: Not on file  . Highest education level: Bachelor's degree (e.g., BA, AB, BS)  Occupational History  . Not on file  Tobacco Use  . Smoking status: Never Smoker  . Smokeless tobacco: Never Used  Substance and  Sexual Activity  . Alcohol use: Not Currently    Alcohol/week: 8.0 standard drinks    Types: 8 Shots of liquor per week    Comment: recovering alcoholic  . Drug use: Yes    Frequency: 3.0 times per week    Types: Marijuana    Comment: 5 days ago  . Sexual activity: Yes    Birth control/protection: Condom  Other Topics Concern  . 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 Difficulty of Paying Living Expenses: Somewhat hard  Food Insecurity: Food Insecurity Present  . Worried About Charity fundraiser in the Last Year: Sometimes true  . Ran Out of Food in the Last Year: Sometimes true  Transportation Needs:   . Lack of Transportation (Medical): Not on file  . Lack of Transportation (Non-Medical): Not on file  Physical Activity:   . Days of Exercise per Week: Not on file  . Minutes of Exercise per Session: Not on file  Stress:   . Feeling of Stress : Not on file  Social Connections: Unknown  . Frequency of Communication with Friends and Family: Not on file  . Frequency of Social Gatherings with Friends and Family: Not on file  . Attends Religious Services: Not on file  . Active Member of Clubs or Organizations: Yes  . Attends Archivist Meetings: More than 4 times per year  . Marital Status: Not on file    Allergies:  Allergies  Allergen Reactions  . Hydrocodone     UPSETS STOMACH    Metabolic Disorder Labs: No results found for: HGBA1C, MPG No results found for: PROLACTIN Lab Results  Component Value Date   CHOL 241 (H) 09/19/2018   TRIG 316.0 (H) 09/19/2018   HDL 47.00 09/19/2018   CHOLHDL 5 09/19/2018   VLDL 63.2 (H) 09/19/2018   Lab Results  Component Value Date   TSH 0.74  03/31/2016   TSH 0.48 09/20/2013    Therapeutic Level Labs: Lab Results  Component Value Date   LITHIUM 0.6 12/18/2016   No results found for: VALPROATE No components found for:  CBMZ  Current Medications: Current Outpatient Medications  Medication Sig Dispense Refill  . buPROPion (WELLBUTRIN XL) 300 MG 24 hr tablet Take 1 tablet (300 mg total) by mouth daily. 90 tablet 1  . doxepin (SINEQUAN) 50 MG capsule Take 1 capsule (50 mg total) by mouth at bedtime. 90 capsule 0  . hydrochlorothiazide (HYDRODIURIL) 25 MG tablet Take 1 tablet (25 mg total) by mouth daily. 90 tablet 3  . lithium carbonate (LITHOBID) 300 MG CR tablet Take 2 tablets (600 mg total) by mouth at bedtime. 180 tablet 0  .  losartan (COZAAR) 100 MG tablet TAKE 1 TABLET BY MOUTH EVERY DAY (Patient taking differently: Take 100 mg by mouth daily. ) 90 tablet 3  . lurasidone (LATUDA) 40 MG TABS tablet Take 1 tablet (40 mg total) by mouth daily at 6 PM. 30 tablet 1  . metoprolol tartrate (LOPRESSOR) 25 MG tablet Take 1 tablet (25 mg total) by mouth 2 (two) times daily. 60 tablet 1  . OLANZapine (ZYPREXA) 5 MG tablet Take 1 tablet (5 mg total) by mouth as needed (anxiety). 90 tablet 0  . Omeprazole 20 MG TBEC Take 20 mg by mouth daily.     . pravastatin (PRAVACHOL) 10 MG tablet TAKE 1 TABLET BY MOUTH EVERY DAY (Patient taking differently: Take 10 mg by mouth daily. ) 90 tablet 3  . valACYclovir (VALTREX) 1000 MG tablet Take 1 tablet (1,000 mg total) by mouth 2 (two) times daily as needed (fever blisters). 60 tablet 5   Current Facility-Administered Medications  Medication Dose Route Frequency Provider Last Rate Last Admin  . Naltrexone SUSR 380 mg  380 mg Intramuscular Q30 days Lajarvis Italiano A, MD   380 mg at 01/04/19 1355      Psychiatric Specialty Exam: Review of Systems  Constitutional: Positive for fatigue.  Psychiatric/Behavioral: The patient is nervous/anxious.   All other systems reviewed and are negative.    There were no vitals taken for this visit.There is no height or weight on file to calculate BMI.  General Appearance: NA  Eye Contact:  NA  Speech:  Clear and Coherent and Normal Rate  Volume:  Normal  Mood:  Depressed  Affect:  NA  Thought Process:  Goal Directed and Linear  Orientation:  Full (Time, Place, and Person)  Thought Content: Logical   Suicidal Thoughts:  No  Homicidal Thoughts:  No  Memory:  Immediate;   Good Recent;   Good Remote;   Good  Judgement:  Fair  Insight:  Fair  Psychomotor Activity:  NA  Concentration:  Concentration: Good  Recall:  Good  Fund of Knowledge: Good  Language: Good  Akathisia:  Negative  Handed:  Right  AIMS (if indicated): not done  Assets:  Communication Skills Desire for Improvement Financial Resources/Insurance Housing Physical Health Resilience Social Support  ADL's:  Intact  Cognition: WNL  Sleep:  Fair   Screenings: AUDIT     Counselor from 07/11/2018 in [place]  Alcohol Use Disorder Identification Test Final Score (AUDIT)  30    GAD-7     Counselor from 07/11/2018 in [place]  Total GAD-7 Score  9    PHQ2-9     Counselor from 07/11/2018 in [place]  PHQ-2 Total Score  2  PHQ-9 Total Score  8       Assessment and Plan: 44yo male withBPAD and alcohol use disorder severe and bipolar disorder. He was twice in inpatient rehab ([place] and in [name]). 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 He completed CD-IOP at [facility] is now working individually with [employer]. He started IM Vivitrol 380 mg on October 20th (sober since 11/18/18) but decided not to continue after three months. He now lives with his parents and  drinks only occasionally, not losing control. Moodremains depressed and mildly anxious. Sleep and appetite are  good. He does not use doxepin often (for sleep). Lithium dose was increased to 300 mg bid while in [place] now takes a whole dose at bedtime.He has complained of loss of libido and we decreased dose of Lexapro and eventually discontinued it. Wellbutrin was increased instead.He takesolanzapine prn anxiety, doxepin for insomnia, lithium for mood stabilization. He had genetic testing done. He plans now to go to a Art gallery manager school.  Dx: Alcohol use disorder severe in partial remission; BPAD, depressed moderate  Plan:ContinueWellbutrin XL 300 mg,lithium CR 600 mg at HS,and olanzapine unchanged.Stop doxepin and start Latuda 40 mg at HS for bipolar depression. Continue counseling with [facility].Return to clinic in two months.The plan was discussed with patient who had an opportunity to ask questions and these were all answered. I spend25 minutes inphone consultationwith the patient.    Stephanie Acre, MD 02/07/2019, 10:45 AM

## 2019-02-08 ENCOUNTER — Telehealth (HOSPITAL_COMMUNITY): Payer: Self-pay | Admitting: *Deleted

## 2019-02-08 ENCOUNTER — Ambulatory Visit (HOSPITAL_COMMUNITY): Payer: BC Managed Care – PPO

## 2019-02-08 NOTE — Telephone Encounter (Signed)
Writer called to pt to remind him that he was scheduled for Vivitrol injection this morning. Pt was unaware of appointment he said. Pt rescheduled for next week as he is working this week.

## 2019-02-15 ENCOUNTER — Ambulatory Visit (INDEPENDENT_AMBULATORY_CARE_PROVIDER_SITE_OTHER): Payer: BC Managed Care – PPO | Admitting: Licensed Clinical Social Worker

## 2019-02-15 ENCOUNTER — Other Ambulatory Visit: Payer: Self-pay

## 2019-02-15 ENCOUNTER — Encounter (HOSPITAL_COMMUNITY): Payer: Self-pay | Admitting: Licensed Clinical Social Worker

## 2019-02-15 DIAGNOSIS — F1020 Alcohol dependence, uncomplicated: Secondary | ICD-10-CM

## 2019-02-15 DIAGNOSIS — F3132 Bipolar disorder, current episode depressed, moderate: Secondary | ICD-10-CM

## 2019-02-15 NOTE — Progress Notes (Signed)
Virtual Visit via Video Note  I connected with Thomas Williamson on 02/15/19 at  9:00 AM EST by a video enabled telemedicine application and verified that I am speaking with the correct person using two identifiers.   I discussed the limitations of evaluation and management by telemedicine and the availability of in person appointments. The patient expressed understanding and agreed to proceed.  History of Present Illness: Pt is referred to individual therapy after completion of CD-IOP for alcoholism. Pt has previously been to rehab facilities ([place] and [name]).    Observations/Objective: Pt presented  depressed for his webex individual therapy session.  Patient described his psychiatric symptoms and current life events. Patient has not been to therapy since 11/20 so he provided updates: quit job, enrolled in [place], started taking Naltrexone shot for alcohol cravings, still living with parents. Patient described his recovery efforts: drank some over the holidays, bored with AA meetings, no sponsor, not reading the [employer]. Coached patient on continued drinking will only exacerbate desire for more alcohol. Encouraged patient to continue to work on his recovery and remaining abstinent. Spent the rest of the session completing case management tasks: Scheduling vivatrol shot, getting update of prescription sent to pharmacy, followup on [employer] authorization, pt needing lab work for lithium level.       PLAN:  "Rewiring your brain" but hasn't started it. Pt began 21 days of gratitude.  Assessment and Plan: Counselor will continue to meet with patient to address treatment plan goals. Patient will continue to follow recommendations of providers and implement skills learned in session.  Follow Up Instructions: I discussed the assessment and treatment plan with the patient. The patient was provided an opportunity to ask questions and all were answered. The patient agreed with the plan and  demonstrated an understanding of the instructions.   The patient was advised to call back or seek an in-person evaluation if the symptoms worsen or if the condition fails to improve as anticipated.  I provided 60 minutes of non-face-to-face time during this encounter.   Brax Walen S, LCAS

## 2019-02-20 ENCOUNTER — Other Ambulatory Visit: Payer: Self-pay

## 2019-02-20 ENCOUNTER — Telehealth (HOSPITAL_COMMUNITY): Payer: Self-pay | Admitting: *Deleted

## 2019-02-20 ENCOUNTER — Ambulatory Visit (INDEPENDENT_AMBULATORY_CARE_PROVIDER_SITE_OTHER): Payer: BC Managed Care – PPO | Admitting: *Deleted

## 2019-02-20 DIAGNOSIS — F1020 Alcohol dependence, uncomplicated: Secondary | ICD-10-CM | POA: Diagnosis not present

## 2019-02-20 DIAGNOSIS — F3132 Bipolar disorder, current episode depressed, moderate: Secondary | ICD-10-CM | POA: Diagnosis not present

## 2019-02-20 NOTE — Progress Notes (Signed)
Pt in today for scheduled Vivitrol injection. Pt anxious, pleasant and cooperative on approach. Pt admits to "having a couple drinks" recently as he was "bored". Pt endorses going to [place] meetings and continuing injections. IM given in left upper outer quadrant. Pt tolerated injection well. Pt to return in 30 days.

## 2019-02-20 NOTE — Telephone Encounter (Signed)
Pt in for monthly injection of Vivitrol. Pt asked about starting Latuda and states that his pharmcay is asking for a PA so he hasn't been able to start medication. Writer gave pt samples for one month Latuda 40mg  and will f/u on PA. Pt instructed to take one at 6 PM per Dr. Montel Culver last note.

## 2019-02-22 ENCOUNTER — Telehealth (HOSPITAL_COMMUNITY): Payer: Self-pay | Admitting: *Deleted

## 2019-02-22 ENCOUNTER — Other Ambulatory Visit (HOSPITAL_COMMUNITY): Payer: Self-pay | Admitting: *Deleted

## 2019-02-22 ENCOUNTER — Encounter (HOSPITAL_COMMUNITY): Payer: Self-pay | Admitting: Licensed Clinical Social Worker

## 2019-02-22 ENCOUNTER — Other Ambulatory Visit: Payer: Self-pay

## 2019-02-22 ENCOUNTER — Ambulatory Visit (INDEPENDENT_AMBULATORY_CARE_PROVIDER_SITE_OTHER): Payer: BC Managed Care – PPO | Admitting: Licensed Clinical Social Worker

## 2019-02-22 DIAGNOSIS — F3132 Bipolar disorder, current episode depressed, moderate: Secondary | ICD-10-CM

## 2019-02-22 DIAGNOSIS — F1020 Alcohol dependence, uncomplicated: Secondary | ICD-10-CM | POA: Diagnosis not present

## 2019-02-22 NOTE — Progress Notes (Signed)
Virtual Visit via Video Note  I connected with Thomas Williamson on 02/22/19 at  9:00 AM EST by a video enabled telemedicine application and verified that I am speaking with the correct person using two identifiers.   I discussed the limitations of evaluation and management by telemedicine and the availability of in person appointments. The patient expressed understanding and agreed to proceed.  History of Present Illness: Pt is referred to individual therapy after completion of CD-IOP for alcoholism. Pt has previously been to rehab facilities ([place] and [name]).    Observations/Objective: Pt presented  Anxious for his webex individual therapy session.  Patient described his psychiatric symptoms and current life events. Patient reports he went to 6 meetings last week. "I'm bored with the meetings. I don't really want a sponsor, someone telling me what to do." Discussed the importance of sponsorship. Suggest for patient to try new meetings. Emailed patient step 1 to complete before next session. Reviewed step 1 with patient.  To begin discuss of step 1 at next session after he completes the packet. Patient got his Naltrexone shot, suggested patient call to schedule appointment for next shot. Patient admits Naltrexone is helping with his cravings for alcohol.Discussed cravings with patient. Patient needs to have blood work for lithium level, suggested he call to get appointment for blood work, Social research officer, government.  PLAN:  Step 1, "Rewiring your brain" but hasn't started it. Pt began 21 days of gratitude.  Assessment and Plan: Counselor will continue to meet with patient to address treatment plan goals. Patient will continue to follow recommendations of providers and implement skills learned in session.  Follow Up Instructions: I discussed the assessment and treatment plan with the patient. The patient was provided an opportunity to ask questions and all were answered. The patient agreed with the plan and  demonstrated an understanding of the instructions.   The patient was advised to call back or seek an in-person evaluation if the symptoms worsen or if the condition fails to improve as anticipated.  I provided 45 minutes of non-face-to-face time during this encounter.   Jesstin Studstill S, LCAS

## 2019-02-22 NOTE — Telephone Encounter (Signed)
Writer spoke with pt to inform him that lab orders for Lithium level and TSH  have been sent to [place], [place]. Pt verbalizes understanding.

## 2019-02-24 ENCOUNTER — Telehealth: Payer: Self-pay | Admitting: Gastroenterology

## 2019-02-24 NOTE — Telephone Encounter (Signed)
Hey Dr. Havery Moros- This patient Thomas Williamson would like to transfer his care from [facility] to [name]. He states he is due for his colonoscopy. He has requested you to be his doctor as he stated his dad has came to you. I have his medical records and I am sending them to you for review. Please advise. Thank you!

## 2019-02-24 NOTE — Telephone Encounter (Signed)
Sure thing. I am happy to take care of him. I'll await his records, I don't see them in my office right now, but will review and determine if best to see in the office first or if we should direct book for colonoscopy. Thanks

## 2019-02-28 ENCOUNTER — Encounter: Payer: Self-pay | Admitting: Gastroenterology

## 2019-03-01 ENCOUNTER — Ambulatory Visit (INDEPENDENT_AMBULATORY_CARE_PROVIDER_SITE_OTHER): Payer: BC Managed Care – PPO | Admitting: Licensed Clinical Social Worker

## 2019-03-01 ENCOUNTER — Encounter (HOSPITAL_COMMUNITY): Payer: Self-pay | Admitting: Licensed Clinical Social Worker

## 2019-03-01 ENCOUNTER — Other Ambulatory Visit: Payer: Self-pay

## 2019-03-01 DIAGNOSIS — F1020 Alcohol dependence, uncomplicated: Secondary | ICD-10-CM | POA: Diagnosis not present

## 2019-03-01 DIAGNOSIS — F3132 Bipolar disorder, current episode depressed, moderate: Secondary | ICD-10-CM

## 2019-03-01 NOTE — Progress Notes (Signed)
Virtual Visit via Video Note  I connected with Thomas Williamson on 03/01/19 at  9:00 AM EST by a video enabled telemedicine application and verified that I am speaking with the correct person using two identifiers.   I discussed the limitations of evaluation and management by telemedicine and the availability of in person appointments. The patient expressed understanding and agreed to proceed.  History of Present Illness: Pt is referred to individual therapy after completion of CD-IOP for alcoholism. Pt has previously been to rehab facilities ([place] and [name]).    Observations/Objective: Pt presented  depressed for his webex individual therapy session.  Patient described his psychiatric symptoms and current life events. Patient reports he went to 6 meetings last week, some in person. He continues to look for a sponsor, at [employer] suggestions. Patient did not complete the step 1 workbook. Discussed powerlessness, which patient admitted to being over his alcohol consumption. Again, reviewed gratitude workbook, which patient had not completed. Patient printed both workbooks out in session. Encouraged him to complete them.   PLAN: Patient needs to have blood work for lithium level, suggested he call to get appointment for blood work, Social research officer, government.  PLAN:  Step 1, "Rewiring your brain" but hasn't started it. Pt began 21 days of gratitude.  Assessment and Plan: Counselor will continue to meet with patient to address treatment plan goals. Patient will continue to follow recommendations of providers and implement skills learned in session.  Follow Up Instructions: I discussed the assessment and treatment plan with the patient. The patient was provided an opportunity to ask questions and all were answered. The patient agreed with the plan and demonstrated an understanding of the instructions.   The patient was advised to call back or seek an in-person evaluation if the symptoms worsen or if the  condition fails to improve as anticipated.  I provided 45 minutes of non-face-to-face time during this encounter.   Raeden Schippers S, LCAS

## 2019-03-03 ENCOUNTER — Other Ambulatory Visit (HOSPITAL_COMMUNITY): Payer: Self-pay | Admitting: Psychiatry

## 2019-03-03 DIAGNOSIS — F3171 Bipolar disorder, in partial remission, most recent episode hypomanic: Secondary | ICD-10-CM

## 2019-03-07 ENCOUNTER — Ambulatory Visit: Payer: BC Managed Care – PPO | Admitting: Gastroenterology

## 2019-03-07 ENCOUNTER — Other Ambulatory Visit: Payer: Self-pay

## 2019-03-07 ENCOUNTER — Encounter: Payer: Self-pay | Admitting: Gastroenterology

## 2019-03-07 VITALS — HR 68 | Temp 97.6°F | Ht 70.0 in | Wt 201.0 lb

## 2019-03-07 DIAGNOSIS — Z860100 Personal history of colon polyps, unspecified: Secondary | ICD-10-CM | POA: Insufficient documentation

## 2019-03-07 DIAGNOSIS — R9430 Abnormal result of cardiovascular function study, unspecified: Secondary | ICD-10-CM | POA: Diagnosis not present

## 2019-03-07 MED ORDER — SUPREP BOWEL PREP KIT 17.5-3.13-1.6 GM/177ML PO SOLN: 1.0000 | ORAL | 0 refills | Status: DC

## 2019-03-07 NOTE — Patient Instructions (Signed)
You have been scheduled for a colonoscopy. Please follow written instructions given to you at your visit today.  Please pick up your prep supplies at the pharmacy within the next 1-3 days. If you use inhalers (even only as needed), please bring them with you on the day of your procedure.  We have sent the following medications to your pharmacy for you to pick up at your convenience: Suprep    If you are age 44 or younger, your body mass index should be between 19-25. Your Body mass index is 28.84 kg/m. If this is out of the aformentioned range listed, please consider follow up with your Primary Care Provider.    Thank you for choosing me and [facility].  Janett Billow Zehr-PA

## 2019-03-07 NOTE — H&P (View-Only) (Signed)
03/07/2019 Thomas Williamson [id number] 09-17-1975   HISTORY OF PRESENT ILLNESS: This is a 44 year old male with past medical history as listed below, most notably for alcohol use disorder and has been noted to have a reduced cardiac ejection fraction of 30 to 35% on most recent echo in August 2020, thought to possibly due to alcohol use.  Nonetheless, he is here today to discuss colonoscopy.  He is requesting Dr. Havery Moros for his physician and has been accepted by Dr. Havery Moros as well.  He has history of colon polyps.  Had colonoscopy in May 2012 by Dr. Watt Climes at [facility].  At that time he had internal and external hemorrhoids, diverticulosis in the ascending colon, and 1 small polyp removed from the bowel proximal a sending colon/cecum.  Biopsies had also been obtained for complaints of diarrhea.  Terminal ileal biopsies were normal as well as random colon biopsies.  Polyp was a tubular adenoma.  Repeat colonoscopy was recommended at a 5-year interval.  He has not yet had that performed but is here today to schedule.  He denies any significant GI complaints.  He says that he has irritable bowel reporting frequent trips to the bathroom in the early morning, but resolves after couple of hours.  Denies any rectal bleeding.   Past Medical History:  Diagnosis Date  . Alcohol use disorder, severe, in early remission ([place]) 03/30/2018  . Allergy   . Anxiety   . Bipolar disorder, in partial remission, most recent episode hypomanic ([place]) 03/30/2018  . Bleeding internal hemorrhoids 09/12/2013  . Cannabis dependence ([place]) 07/13/2018   Daily use  . Cervical disc disease 11/20/2014  . Chronic anxiety 07/13/2018  . Complication of anesthesia   . Depression   . GERD (gastroesophageal reflux disease)   . Hernia, inguinal, bilateral 02/24/2011  . High ankle sprain of right lower extremity 01/16/2015  . History of hiatal hernia   . Hx of hepatitis C 10/05/2017   -treated in 2019 -hepatology recommended no further  surveillance needed except for LFTs with labs and see hepatologist if elevated  . Hypertension   . Lipids abnormal 09/29/2013  . Pneumonia   . PONV (postoperative nausea and vomiting)    Past Surgical History:  Procedure Laterality Date  . ANTERIOR CRUCIATE LIGAMENT REPAIR  2010  . INGUINAL HERNIA REPAIR  02/23/2012   Procedure: LAPAROSCOPIC BILATERAL INGUINAL HERNIA REPAIR;  Surgeon: Shann Medal, MD;  Location: [facility];  Service: General;  Laterality: Bilateral;  Laparoscopic Bilateral Inguinal Hernia Repair with mesh  . INSERTION OF MESH  02/23/2012   Procedure: INSERTION OF MESH;  Surgeon: Shann Medal, MD;  Location: [facility];  Service: General;  Laterality: N/A;  . NOSE SURGERY  [phone]    reports that he has quit smoking. He has never used smokeless tobacco. He reports current alcohol use of about 8.0 standard drinks of alcohol per week. He reports current drug use. Frequency: 3.00 times per week. Drug: Marijuana. family history includes Atrial fibrillation in his mother; Diabetes in his brother; Hypertension in his father and mother; Leukemia in his father; Prostate cancer (age of onset: 94) in his father. Allergies  Allergen Reactions  . Hydrocodone     UPSETS STOMACH      Outpatient Encounter Medications as of 03/07/2019  Medication Sig  . buPROPion (WELLBUTRIN XL) 300 MG 24 hr tablet Take 1 tablet (300 mg total) by mouth daily.  . hydrochlorothiazide (HYDRODIURIL) 25 MG tablet Take 1 tablet (25 mg total)  by mouth daily.  Marland Kitchen lithium carbonate (LITHOBID) 300 MG CR tablet Take 2 tablets (600 mg total) by mouth at bedtime.  Marland Kitchen losartan (COZAAR) 100 MG tablet TAKE 1 TABLET BY MOUTH EVERY DAY (Patient taking differently: Take 100 mg by mouth daily. )  . lurasidone (LATUDA) 40 MG TABS tablet Take 1 tablet (40 mg total) by mouth daily at 6 PM.  . OLANZapine (ZYPREXA) 5 MG tablet Take 1 tablet (5 mg total) by mouth as needed (anxiety).  . Omeprazole 20 MG TBEC Take 20 mg by mouth daily.    . pravastatin (PRAVACHOL) 10 MG tablet TAKE 1 TABLET BY MOUTH EVERY DAY (Patient taking differently: Take 10 mg by mouth daily. )  . valACYclovir (VALTREX) 1000 MG tablet Take 1 tablet (1,000 mg total) by mouth 2 (two) times daily as needed (fever blisters).  . metoprolol tartrate (LOPRESSOR) 25 MG tablet Take 1 tablet (25 mg total) by mouth 2 (two) times daily.  . [DISCONTINUED] doxepin (SINEQUAN) 50 MG capsule Take 1 capsule (50 mg total) by mouth at bedtime.   Facility-Administered Encounter Medications as of 03/07/2019  Medication  . Naltrexone SUSR 380 mg     REVIEW OF SYSTEMS  : All other systems reviewed and negative except where noted in the History of Present Illness.   PHYSICAL EXAM: Pulse 68   Temp 97.6 F (36.4 C)   Ht 5\' 10"  (1.778 m)   Wt 201 lb (91.2 kg)   SpO2 96%   BMI 28.84 kg/m  General: Well developed white male in no acute distress Head: Normocephalic and atraumatic Eyes:  Sclerae anicteric, conjunctiva pink. Ears: Normal auditory acuity Lungs: Clear throughout to auscultation; no increased WOB. Heart: Regular rate and rhythm; no M/R/G. Abdomen: Soft, non-distended.  BS present.  Non-tender. Rectal:  Will be done at the time of colonoscopy. Musculoskeletal: Symmetrical with no gross deformities  Skin: No lesions on visible extremities Extremities: No edema  Neurological: Alert oriented x 4, grossly non-focal Psychological:  Alert and cooperative. Normal mood and affect  ASSESSMENT AND PLAN: *Personal history of colon polyps: Had one adenomatous polyp removed during colonoscopy in May 2012.  5-year recall recommended.  We will schedule with Dr. Havery Moros at [facility] due to low ejection fraction. *Ejection fraction of 30 to 35% on last echo in August 2020 thought to be due to alcohol use.  **The risks, benefits, and alternatives to colonoscopy were discussed with the patient and he consents to proceed.   CC:  [name], MD

## 2019-03-07 NOTE — Progress Notes (Signed)
03/07/2019 Thomas Williamson [id number] [birth year]   HISTORY OF PRESENT ILLNESS: This is a 44 year old male with past medical history as listed below, most notably for alcohol use disorder and has been noted to have a reduced cardiac ejection fraction of 30 to 35% on most recent echo in August 2020, thought to possibly due to alcohol use.  Nonetheless, he is here today to discuss colonoscopy.  He is requesting Dr. Havery Moros for his physician and has been accepted by Dr. Havery Moros as well.  He has history of colon polyps.  Had colonoscopy in May 2012 by Dr. Watt Climes at [facility].  At that time he had internal and external hemorrhoids, diverticulosis in the ascending colon, and 1 small polyp removed from the bowel proximal a sending colon/cecum.  Biopsies had also been obtained for complaints of diarrhea.  Terminal ileal biopsies were normal as well as random colon biopsies.  Polyp was a tubular adenoma.  Repeat colonoscopy was recommended at a 5-year interval.  He has not yet had that performed but is here today to schedule.  He denies any significant GI complaints.  He says that he has irritable bowel reporting frequent trips to the bathroom in the early morning, but resolves after couple of hours.  Denies any rectal bleeding.   Past Medical History:  Diagnosis Date  . Alcohol use disorder, severe, in early remission ([place]) 03/30/2018  . Allergy   . Anxiety   . Bipolar disorder, in partial remission, most recent episode hypomanic ([place]) 03/30/2018  . Bleeding internal hemorrhoids 09/12/2013  . Cannabis dependence ([place]) 07/13/2018   Daily use  . Cervical disc disease 11/20/2014  . Chronic anxiety 07/13/2018  . Complication of anesthesia   . Depression   . GERD (gastroesophageal reflux disease)   . Hernia, inguinal, bilateral 02/24/2011  . High ankle sprain of right lower extremity 01/16/2015  . History of hiatal hernia   . Hx of hepatitis C 10/05/2017   -treated in 2019 -hepatology recommended no further  surveillance needed except for LFTs with labs and see hepatologist if elevated  . Hypertension   . Lipids abnormal 09/29/2013  . Pneumonia   . PONV (postoperative nausea and vomiting)    Past Surgical History:  Procedure Laterality Date  . ANTERIOR CRUCIATE LIGAMENT REPAIR  2010  . INGUINAL HERNIA REPAIR  02/23/2012   Procedure: LAPAROSCOPIC BILATERAL INGUINAL HERNIA REPAIR;  Surgeon: Shann Medal, MD;  Location: [facility];  Service: General;  Laterality: Bilateral;  Laparoscopic Bilateral Inguinal Hernia Repair with mesh  . INSERTION OF MESH  02/23/2012   Procedure: INSERTION OF MESH;  Surgeon: Shann Medal, MD;  Location: [facility];  Service: General;  Laterality: N/A;  . NOSE SURGERY  [phone]    reports that he has quit smoking. He has never used smokeless tobacco. He reports current alcohol use of about 8.0 standard drinks of alcohol per week. He reports current drug use. Frequency: 3.00 times per week. Drug: Marijuana. family history includes Atrial fibrillation in his mother; Diabetes in his brother; Hypertension in his father and mother; Leukemia in his father; Prostate cancer (age of onset: 29) in his father. Allergies  Allergen Reactions  . Hydrocodone     UPSETS STOMACH      Outpatient Encounter Medications as of 03/07/2019  Medication Sig  . buPROPion (WELLBUTRIN XL) 300 MG 24 hr tablet Take 1 tablet (300 mg total) by mouth daily.  . hydrochlorothiazide (HYDRODIURIL) 25 MG tablet Take 1 tablet (25 mg total)  by mouth daily.  Marland Kitchen lithium carbonate (LITHOBID) 300 MG CR tablet Take 2 tablets (600 mg total) by mouth at bedtime.  Marland Kitchen losartan (COZAAR) 100 MG tablet TAKE 1 TABLET BY MOUTH EVERY DAY (Patient taking differently: Take 100 mg by mouth daily. )  . lurasidone (LATUDA) 40 MG TABS tablet Take 1 tablet (40 mg total) by mouth daily at 6 PM.  . OLANZapine (ZYPREXA) 5 MG tablet Take 1 tablet (5 mg total) by mouth as needed (anxiety).  . Omeprazole 20 MG TBEC Take 20 mg by mouth daily.    . pravastatin (PRAVACHOL) 10 MG tablet TAKE 1 TABLET BY MOUTH EVERY DAY (Patient taking differently: Take 10 mg by mouth daily. )  . valACYclovir (VALTREX) 1000 MG tablet Take 1 tablet (1,000 mg total) by mouth 2 (two) times daily as needed (fever blisters).  . metoprolol tartrate (LOPRESSOR) 25 MG tablet Take 1 tablet (25 mg total) by mouth 2 (two) times daily.  . [DISCONTINUED] doxepin (SINEQUAN) 50 MG capsule Take 1 capsule (50 mg total) by mouth at bedtime.   Facility-Administered Encounter Medications as of 03/07/2019  Medication  . Naltrexone SUSR 380 mg     REVIEW OF SYSTEMS  : All other systems reviewed and negative except where noted in the History of Present Illness.   PHYSICAL EXAM: Pulse 68   Temp 97.6 F (36.4 C)   Ht 5\' 10"  (1.778 m)   Wt 201 lb (91.2 kg)   SpO2 96%   BMI 28.84 kg/m  General: Well developed white male in no acute distress Head: Normocephalic and atraumatic Eyes:  Sclerae anicteric, conjunctiva pink. Ears: Normal auditory acuity Lungs: Clear throughout to auscultation; no increased WOB. Heart: Regular rate and rhythm; no M/R/G. Abdomen: Soft, non-distended.  BS present.  Non-tender. Rectal:  Will be done at the time of colonoscopy. Musculoskeletal: Symmetrical with no gross deformities  Skin: No lesions on visible extremities Extremities: No edema  Neurological: Alert oriented x 4, grossly non-focal Psychological:  Alert and cooperative. Normal mood and affect  ASSESSMENT AND PLAN: *Personal history of colon polyps: Had one adenomatous polyp removed during colonoscopy in May 2012.  5-year recall recommended.  We will schedule with Dr. Havery Moros at [facility] due to low ejection fraction. *Ejection fraction of 30 to 35% on last echo in August 2020 thought to be due to alcohol use.  **The risks, benefits, and alternatives to colonoscopy were discussed with the patient and he consents to proceed.   CC:  [name], MD

## 2019-03-08 ENCOUNTER — Other Ambulatory Visit: Payer: Self-pay

## 2019-03-08 ENCOUNTER — Encounter (HOSPITAL_COMMUNITY): Payer: Self-pay | Admitting: Licensed Clinical Social Worker

## 2019-03-08 ENCOUNTER — Ambulatory Visit (INDEPENDENT_AMBULATORY_CARE_PROVIDER_SITE_OTHER): Payer: BC Managed Care – PPO | Admitting: Licensed Clinical Social Worker

## 2019-03-08 ENCOUNTER — Other Ambulatory Visit (HOSPITAL_COMMUNITY): Payer: Self-pay | Admitting: Psychiatry

## 2019-03-08 DIAGNOSIS — F3132 Bipolar disorder, current episode depressed, moderate: Secondary | ICD-10-CM

## 2019-03-08 DIAGNOSIS — F1020 Alcohol dependence, uncomplicated: Secondary | ICD-10-CM | POA: Diagnosis not present

## 2019-03-08 NOTE — Progress Notes (Signed)
Agree with assessment and plan as outlined. Due for surveillance colonoscopy given history of adenoma in 2012. Given low EF he will require anesthesia care at the hospital for his case. Further recommendations pending the results.

## 2019-03-08 NOTE — Progress Notes (Signed)
Virtual Visit via Video Note  I connected with Thomas Williamson on 03/08/19 at  9:00 AM EST by a video enabled telemedicine application and verified that I am speaking with the correct person using two identifiers.   I discussed the limitations of evaluation and management by telemedicine and the availability of in person appointments. The patient expressed understanding and agreed to proceed.  History of Present Illness: Pt is referred to individual therapy after completion of CD-IOP for alcoholism. Pt has previously been to rehab facilities ([place] and [name]).    Observations/Objective: Pt presented  depressed for his webex individual therapy session.  Patient described his psychiatric symptoms and current life events. Patient reports he is having a colonoscopy Monday and is stressed about the procedure. Taught patient coping skills to deal with his current stress. Patient reports he went to 6 meetings last week, some in person. He continues to look for a sponsor, at [place] suggestion. As patient is currently living with his parents in [place], suggested he look for meetings there. Patient completed the first part of his gratitude workbook, reviewed his gratitudes. Discussed the importance of gratitude.Patient still did not complete the step 1 workbook. Encouraged patient to complete by next session. Gave patient information [facility] # to call for blood work. Made new appointments.  PLAN: Patient needs to have blood work for lithium level, suggested he call to get appointment for blood work, Social research officer, government.  PLAN:  Step 1, "Rewiring your brain" but hasn't started it. STEP 1.  Assessment and Plan: Counselor will continue to meet with patient to address treatment plan goals. Patient will continue to follow recommendations of providers and implement skills learned in session.  Follow Up Instructions: I discussed the assessment and treatment plan with the patient. The patient was provided an  opportunity to ask questions and all were answered. The patient agreed with the plan and demonstrated an understanding of the instructions.   The patient was advised to call back or seek an in-person evaluation if the symptoms worsen or if the condition fails to improve as anticipated.  I provided 45 minutes of non-face-to-face time during this encounter.   Ela Moffat S, LCAS

## 2019-03-09 ENCOUNTER — Other Ambulatory Visit (HOSPITAL_COMMUNITY)
Admission: RE | Admit: 2019-03-09 | Discharge: 2019-03-09 | Disposition: A | Discharge status: Home / Self Care | Payer: BC Managed Care – PPO | Source: Ambulatory Visit | Attending: Gastroenterology | Admitting: Gastroenterology

## 2019-03-09 DIAGNOSIS — Z20822 Contact with and (suspected) exposure to covid-19: Secondary | ICD-10-CM | POA: Insufficient documentation

## 2019-03-09 DIAGNOSIS — Z01812 Encounter for preprocedural laboratory examination: Secondary | ICD-10-CM | POA: Insufficient documentation

## 2019-03-09 LAB — SARS CORONAVIRUS 2 (TAT 6-24 HRS): SARS Coronavirus 2: NEGATIVE

## 2019-03-10 ENCOUNTER — Other Ambulatory Visit: Payer: Self-pay

## 2019-03-10 ENCOUNTER — Telehealth: Payer: Self-pay

## 2019-03-10 NOTE — Telephone Encounter (Signed)
Called patient and let him know we need to move his procedure up by 1 hour because a patient scheduled before him tested positive for COVID. He will arrive at 8:15am and start his prep at 4:45am

## 2019-03-13 ENCOUNTER — Other Ambulatory Visit: Payer: Self-pay

## 2019-03-13 ENCOUNTER — Encounter (HOSPITAL_COMMUNITY)
Admission: RE | Disposition: A | Discharge status: Home / Self Care | Payer: Self-pay | Source: Home / Self Care | Attending: Gastroenterology

## 2019-03-13 ENCOUNTER — Ambulatory Visit (HOSPITAL_COMMUNITY): Payer: BC Managed Care – PPO | Admitting: Certified Registered Nurse Anesthetist

## 2019-03-13 ENCOUNTER — Ambulatory Visit (HOSPITAL_COMMUNITY)
Admission: RE | Admit: 2019-03-13 | Discharge: 2019-03-13 | Disposition: A | Discharge status: Home / Self Care | Payer: BC Managed Care – PPO | Attending: Gastroenterology | Admitting: Gastroenterology

## 2019-03-13 ENCOUNTER — Encounter (HOSPITAL_COMMUNITY): Payer: Self-pay | Admitting: Gastroenterology

## 2019-03-13 DIAGNOSIS — K648 Other hemorrhoids: Secondary | ICD-10-CM | POA: Insufficient documentation

## 2019-03-13 DIAGNOSIS — D124 Benign neoplasm of descending colon: Secondary | ICD-10-CM | POA: Insufficient documentation

## 2019-03-13 DIAGNOSIS — D122 Benign neoplasm of ascending colon: Secondary | ICD-10-CM | POA: Insufficient documentation

## 2019-03-13 DIAGNOSIS — Z86010 Personal history of colonic polyps: Secondary | ICD-10-CM | POA: Diagnosis not present

## 2019-03-13 DIAGNOSIS — Z1211 Encounter for screening for malignant neoplasm of colon: Secondary | ICD-10-CM | POA: Diagnosis not present

## 2019-03-13 DIAGNOSIS — R195 Other fecal abnormalities: Secondary | ICD-10-CM

## 2019-03-13 DIAGNOSIS — D123 Benign neoplasm of transverse colon: Secondary | ICD-10-CM | POA: Insufficient documentation

## 2019-03-13 DIAGNOSIS — D120 Benign neoplasm of cecum: Secondary | ICD-10-CM | POA: Insufficient documentation

## 2019-03-13 DIAGNOSIS — Z8619 Personal history of other infectious and parasitic diseases: Secondary | ICD-10-CM | POA: Insufficient documentation

## 2019-03-13 DIAGNOSIS — K635 Polyp of colon: Secondary | ICD-10-CM

## 2019-03-13 DIAGNOSIS — Z885 Allergy status to narcotic agent status: Secondary | ICD-10-CM | POA: Insufficient documentation

## 2019-03-13 DIAGNOSIS — F319 Bipolar disorder, unspecified: Secondary | ICD-10-CM | POA: Insufficient documentation

## 2019-03-13 DIAGNOSIS — Z87891 Personal history of nicotine dependence: Secondary | ICD-10-CM | POA: Insufficient documentation

## 2019-03-13 DIAGNOSIS — Z79899 Other long term (current) drug therapy: Secondary | ICD-10-CM | POA: Diagnosis not present

## 2019-03-13 DIAGNOSIS — I10 Essential (primary) hypertension: Secondary | ICD-10-CM | POA: Diagnosis not present

## 2019-03-13 DIAGNOSIS — K5730 Diverticulosis of large intestine without perforation or abscess without bleeding: Secondary | ICD-10-CM | POA: Diagnosis not present

## 2019-03-13 DIAGNOSIS — D126 Benign neoplasm of colon, unspecified: Secondary | ICD-10-CM

## 2019-03-13 DIAGNOSIS — Z800 Family history of malignant neoplasm of digestive organs: Secondary | ICD-10-CM | POA: Insufficient documentation

## 2019-03-13 HISTORY — PX: COLONOSCOPY: SHX5424

## 2019-03-13 HISTORY — PX: BIOPSY: SHX5522

## 2019-03-13 HISTORY — PX: POLYPECTOMY: SHX5525

## 2019-03-13 SURGERY — COLONOSCOPY
Anesthesia: Monitor Anesthesia Care

## 2019-03-13 MED ORDER — PROPOFOL 500 MG/50ML IV EMUL
INTRAVENOUS | Status: AC
  Filled 2019-03-13: qty 50

## 2019-03-13 MED ORDER — PROPOFOL 10 MG/ML IV BOLUS
INTRAVENOUS | Status: DC | PRN
  Administered 2019-03-13: 30 mg via INTRAVENOUS
  Administered 2019-03-13 (×2): 20 mg via INTRAVENOUS

## 2019-03-13 MED ORDER — PROPOFOL 10 MG/ML IV BOLUS
INTRAVENOUS | Status: AC
  Filled 2019-03-13: qty 20

## 2019-03-13 MED ORDER — MIDAZOLAM HCL 2 MG/2ML IJ SOLN
INTRAMUSCULAR | Status: AC
  Filled 2019-03-13: qty 2

## 2019-03-13 MED ORDER — MIDAZOLAM HCL 2 MG/2ML IJ SOLN
INTRAMUSCULAR | Status: DC | PRN
  Administered 2019-03-13 (×2): 1 mg via INTRAVENOUS

## 2019-03-13 MED ORDER — SODIUM CHLORIDE 0.9 % IV SOLN: INTRAVENOUS | Status: DC

## 2019-03-13 MED ORDER — LACTATED RINGERS IV SOLN: INTRAVENOUS | Status: DC | PRN

## 2019-03-13 MED ORDER — PROPOFOL 500 MG/50ML IV EMUL
INTRAVENOUS | Status: DC | PRN
  Administered 2019-03-13: 125 ug/kg/min via INTRAVENOUS

## 2019-03-13 NOTE — Anesthesia Procedure Notes (Signed)
Procedure Name: MAC Date/Time: 03/13/2019 9:41 AM Performed by: West Pugh, CRNA Pre-anesthesia Checklist: Patient identified, Emergency Drugs available, Suction available, Patient being monitored and Timeout performed Patient Re-evaluated:Patient Re-evaluated prior to induction Oxygen Delivery Method: Simple face mask Preoxygenation: Pre-oxygenation with 100% oxygen Induction Type: IV induction Placement Confirmation: positive ETCO2 Dental Injury: Teeth and Oropharynx as per pre-operative assessment

## 2019-03-13 NOTE — Anesthesia Preprocedure Evaluation (Addendum)
Anesthesia Evaluation  Patient identified by MRN, date of birth, ID band Patient awake    Reviewed: Allergy & Precautions, NPO status , Patient's Chart, lab work & pertinent test results, reviewed documented beta blocker date and time   History of Anesthesia Complications (+) PONV  Airway Mallampati: II  TM Distance: >3 FB Neck ROM: Full    Dental  (+) Dental Advisory Given   Pulmonary Current SmokerPatient did not abstain from smoking.,  03/09/2019 SARS coronavirus NEG   breath sounds clear to auscultation       Cardiovascular hypertension, Pt. on medications and Pt. on home beta blockers (-) angina Rhythm:Regular Rate:Normal  '20 stress: No obvious ischemia or infarct, Low EF 35% '20 ECHO: EF 30-35%, valves OK   Neuro/Psych Anxiety Depression Bipolar Disorder negative neurological ROS     GI/Hepatic GERD  Medicated and Controlled,(+)     substance abuse  alcohol use and marijuana use, Hepatitis -, C  Endo/Other  negative endocrine ROS  Renal/GU negative Renal ROS     Musculoskeletal   Abdominal   Peds  Hematology negative hematology ROS (+)   Anesthesia Other Findings   Reproductive/Obstetrics                            Anesthesia Physical Anesthesia Plan  ASA: III  Anesthesia Plan: MAC   Post-op Pain Management:    Induction:   PONV Risk Score and Plan: 2 and Treatment may vary due to age or medical condition  Airway Management Planned: Natural Airway and Simple Face Mask  Additional Equipment:   Intra-op Plan:   Post-operative Plan:   Informed Consent: I have reviewed the patients History and Physical, chart, labs and discussed the procedure including the risks, benefits and alternatives for the proposed anesthesia with the patient or authorized representative who has indicated his/her understanding and acceptance.     Dental advisory given  Plan Discussed with:  CRNA and Surgeon  Anesthesia Plan Comments:        Anesthesia Quick Evaluation

## 2019-03-13 NOTE — Op Note (Signed)
[facility] Patient Name: Thomas Williamson Procedure Date: 03/13/2019 MRN: [id number] Attending MD: Carlota Raspberry. Havery Moros , MD Date of Birth: 02-02-1976 CSN: [id number] Age: 44 Admit Type: Outpatient Procedure:                Colonoscopy Indications:              Surveillance: Personal history of adenomatous                            polyps on last colonoscopy (last exam in 2012),                            father with colon cancer diagnosed age 34s, also                            incidentally with chronic loose stools Providers:                Remo Lipps P. Havery Moros, MD, Cleda Daub, RN, Lina Sar, Technician, Christell Faith, CRNA Referring MD:              Medicines:                Monitored Anesthesia Care Complications:            No immediate complications. Estimated blood loss:                            Minimal. Estimated Blood Loss:     Estimated blood loss was minimal. Procedure:                Pre-Anesthesia Assessment:                           - Prior to the procedure, a History and Physical                            was performed, and patient medications and                            allergies were reviewed. The patient's tolerance of                            previous anesthesia was also reviewed. The risks                            and benefits of the procedure and the sedation                            options and risks were discussed with the patient.                            All questions were answered, and informed consent  was obtained. Prior Anticoagulants: The patient has                            taken no previous anticoagulant or antiplatelet                            agents. ASA Grade Assessment: III - A patient with                            severe systemic disease. After reviewing the risks                            and benefits, the patient was deemed in   satisfactory condition to undergo the procedure.                           After obtaining informed consent, the colonoscope                            was passed under direct vision. Throughout the                            procedure, the patient's blood pressure, pulse, and                            oxygen saturations were monitored continuously. The                            CF-HQ190L ([id number]) Olympus colonoscope was                            introduced through the anus and advanced to the the                            terminal ileum, with identification of the                            appendiceal orifice and IC valve. The colonoscopy                            was performed without difficulty. The patient                            tolerated the procedure well. The quality of the                            bowel preparation was good. The terminal ileum,                            ileocecal valve, appendiceal orifice, and rectum                            were photographed. Scope In: 9:48:18 AM Scope Out: 10:26:40 AM Scope Withdrawal Time: 0 hours 29 minutes 52  seconds  Total Procedure Duration: 0 hours 38 minutes 22 seconds  Findings:      The perianal and digital rectal examinations were normal.      Limited views of the terminal ileum appeared normal.      A 4 mm polyp was found in the cecum. The polyp was sessile. The polyp       was removed with a cold snare. Resection and retrieval were complete.      A 10 mm polyp versus localized benign mucosal change was found in the       cecum. The polyp was flat. The polyp was removed with a cold snare.       Resection and retrieval were complete.      A few medium-mouthed diverticula were found in the ascending colon.      Four sessile polyps were found in the ascending colon. The polyps were 3       to 5 mm in size. These polyps were removed with a cold snare. Resection       and retrieval were complete.      Four sessile polyps  were found in the transverse colon. The polyps were       3 to 10 mm in size. These polyps were removed with a cold snare.       Resection and retrieval were complete.      A 3 mm polyp was found in the descending colon. The polyp was sessile.       The polyp was removed with a cold snare. Resection and retrieval were       complete.      Internal hemorrhoids were found during retroflexion.      The exam was otherwise without abnormality.      Biopsies for histology were taken with a cold forceps from the right       colon, left colon and transverse colon for evaluation of microscopic       colitis. Impression:               - The examined portion of the ileum was normal.                           - One 4 mm polyp in the cecum, removed with a cold                            snare. Resected and retrieved.                           - One 10 mm polyp versus benign mucosal change in                            the cecum, removed with a cold snare. Resected and                            retrieved.                           - Diverticulosis in the ascending colon.                           - Four 3 to 5 mm polyps  in the ascending colon,                            removed with a cold snare. Resected and retrieved.                           - Four 3 to 10 mm polyps in the transverse colon,                            removed with a cold snare. Resected and retrieved.                           - One 3 mm polyp in the descending colon, removed                            with a cold snare. Resected and retrieved.                           - Internal hemorrhoids.                           - The examination was otherwise normal.                           - Biopsies were taken with a cold forceps from the                            right colon, left colon and transverse colon for                            evaluation of microscopic colitis. Moderate Sedation:      No moderate sedation, case performed  with MAC Recommendation:           - Patient has a contact number available for                            emergencies. The signs and symptoms of potential                            delayed complications were discussed with the                            patient. Return to normal activities tomorrow.                            Written discharge instructions were provided to the                            patient.                           - Resume previous diet.                           - Continue present medications.                           -  Await pathology results with further                            recommendations Procedure Code(s):        --- Professional ---                           [phone], Colonoscopy, flexible; with removal of                            tumor(s), polyp(s), or other lesion(s) by snare                            technique                           45380, 59, Colonoscopy, flexible; with biopsy,                            single or multiple Diagnosis Code(s):        --- Professional ---                           K63.5, Polyp of colon                           Z86.010, Personal history of colonic polyps                           K64.8, Other hemorrhoids                           K57.30, Diverticulosis of large intestine without                            perforation or abscess without bleeding CPT copyright 2019 American Medical Association. All rights reserved. The codes documented in this report are preliminary and upon coder review may  be revised to meet current compliance requirements. Remo Lipps P. Jasreet Dickie, MD 03/13/2019 10:36:46 AM This report has been signed electronically. Number of Addenda: 0

## 2019-03-13 NOTE — Interval H&P Note (Signed)
History and Physical Interval Note:  03/13/2019 9:26 AM  Thomas Williamson  has presented today for surgery, with the diagnosis of H/O colon polyps.  The various methods of treatment have been discussed with the patient and family. After consideration of risks, benefits and other options for treatment, the patient has consented to  Procedure(s): COLONOSCOPY (N/A) as a surgical intervention.  The patient's history has been reviewed, patient examined, no change in status, stable for surgery.  I have reviewed the patient's chart and labs.  Questions were answered to the patient's satisfaction.     [place]

## 2019-03-13 NOTE — Anesthesia Postprocedure Evaluation (Addendum)
Anesthesia Post Note  Patient: Thomas Williamson  Procedure(s) Performed: COLONOSCOPY (N/A ) POLYPECTOMY BIOPSY     Patient location during evaluation: Endoscopy Anesthesia Type: MAC Level of consciousness: awake and alert, patient cooperative and oriented Pain management: pain level controlled Vital Signs Assessment: post-procedure vital signs reviewed and stable Respiratory status: spontaneous breathing, nonlabored ventilation and respiratory function stable Cardiovascular status: blood pressure returned to baseline and stable Postop Assessment: no apparent nausea or vomiting Anesthetic complications: no    Last Vitals:  Vitals:   03/13/19 1035 03/13/19 1046  BP: (!) 135/100 (!) 136/93  Pulse: 78 72  Resp: 14 14  Temp: 36.7 C   SpO2: 100% 99%    Last Pain:  Vitals:   03/13/19 1035  TempSrc: Temporal  PainSc: 0-No pain                 Charleton Deyoung,E. Geovany Trudo

## 2019-03-13 NOTE — Discharge Instructions (Signed)
YOU HAD AN ENDOSCOPIC PROCEDURE TODAY: Refer to the procedure report and other information in the discharge instructions given to you for any specific questions about what was found during the examination. If this information does not answer your questions, please call [place] office at [phone] to clarify.  ° °YOU SHOULD EXPECT: Some feelings of bloating in the abdomen. Passage of more gas than usual. Walking can help get rid of the air that was put into your GI tract during the procedure and reduce the bloating. If you had a lower endoscopy (such as a colonoscopy or flexible sigmoidoscopy) you may notice spotting of blood in your stool or on the toilet paper. Some abdominal soreness may be present for a day or two, also. ° °DIET: Your first meal following the procedure should be a light meal and then it is ok to progress to your normal diet. A half-sandwich or bowl of soup is an example of a good first meal. Heavy or fried foods are harder to digest and may make you feel nauseous or bloated. Drink plenty of fluids but you should avoid alcoholic beverages for 24 hours. If you had a esophageal dilation, please see attached instructions for diet.   ° °ACTIVITY: Your care partner should take you home directly after the procedure. You should plan to take it easy, moving slowly for the rest of the day. You can resume normal activity the day after the procedure however YOU SHOULD NOT DRIVE, use power tools, machinery or perform tasks that involve climbing or major physical exertion for 24 hours (because of the sedation medicines used during the test).  ° °SYMPTOMS TO REPORT IMMEDIATELY: °A gastroenterologist can be reached at any hour. Please call [phone]  for any of the following symptoms:  °Following lower endoscopy (colonoscopy, flexible sigmoidoscopy) °Excessive amounts of blood in the stool  °Significant tenderness, worsening of abdominal pains  °Swelling of the abdomen that is new, acute  °Fever of 100° or  higher  °Following upper endoscopy (EGD, EUS, ERCP, esophageal dilation) °Vomiting of blood or coffee ground material  °New, significant abdominal pain  °New, significant chest pain or pain under the shoulder blades  °Painful or persistently difficult swallowing  °New shortness of breath  °Black, tarry-looking or red, bloody stools ° °FOLLOW UP:  °If any biopsies were taken you will be contacted by phone or by letter within the next 1-3 weeks. Call [phone]  if you have not heard about the biopsies in 3 weeks.  °Please also call with any specific questions about appointments or follow up tests. ° °

## 2019-03-13 NOTE — Transfer of Care (Signed)
Immediate Anesthesia Transfer of Care Note  Patient: Arias Weinert  Procedure(s) Performed: COLONOSCOPY (N/A ) POLYPECTOMY BIOPSY  Patient Location: PACU and Endoscopy Unit  Anesthesia Type:MAC  Level of Consciousness: awake, alert , oriented and patient cooperative  Airway & Oxygen Therapy: Patient Spontanous Breathing and Patient connected to face mask oxygen  Post-op Assessment: Report given to RN and Post -op Vital signs reviewed and stable  Post vital signs: Reviewed and stable  Last Vitals:  Vitals Value Taken Time  BP    Temp    Pulse 80 03/13/19 1035  Resp 10 03/13/19 1035  SpO2 100 % 03/13/19 1035  Vitals shown include unvalidated device data.  Last Pain:  Vitals:   03/13/19 0848  TempSrc: Oral  PainSc: 0-No pain         Complications: No apparent anesthesia complications

## 2019-03-14 ENCOUNTER — Encounter: Payer: Self-pay | Admitting: *Deleted

## 2019-03-14 LAB — TSH+FREE T4
Free T4 by Dialysis: 1 ng/dL
TSH: 1.8 uU/mL

## 2019-03-14 LAB — LITHIUM LEVEL: Lithium Lvl: 0.3 mmol/L — ABNORMAL LOW (ref 0.6–1.2)

## 2019-03-14 LAB — SURGICAL PATHOLOGY

## 2019-03-15 ENCOUNTER — Ambulatory Visit (HOSPITAL_COMMUNITY): Payer: BC Managed Care – PPO | Admitting: Licensed Clinical Social Worker

## 2019-03-16 ENCOUNTER — Other Ambulatory Visit: Payer: Self-pay

## 2019-03-16 ENCOUNTER — Telehealth: Payer: Self-pay

## 2019-03-16 DIAGNOSIS — Z86010 Personal history of colonic polyps: Secondary | ICD-10-CM

## 2019-03-16 NOTE — Telephone Encounter (Signed)
Called patient back and got voice mail. Left another message

## 2019-03-16 NOTE — Telephone Encounter (Signed)
Patient returned your call, please call patient one more time.   

## 2019-03-16 NOTE — Telephone Encounter (Signed)
Left message to please call back. °

## 2019-03-20 ENCOUNTER — Telehealth: Payer: Self-pay | Admitting: Genetic Counselor

## 2019-03-20 NOTE — Telephone Encounter (Signed)
Received a genetic counseling referral from Dr. Havery Moros at [facility] for hx of colon polyps. Thomas Williamson has been cld and scheduled to [name] on 3/1 at 10am. Pt doesn't have an active mychart acct. I verified his email address and mobile number for the genetic counselor to send the appt link.

## 2019-03-22 ENCOUNTER — Ambulatory Visit (HOSPITAL_COMMUNITY): Payer: BC Managed Care – PPO | Admitting: Licensed Clinical Social Worker

## 2019-03-27 ENCOUNTER — Ambulatory Visit (HOSPITAL_COMMUNITY): Payer: BC Managed Care – PPO

## 2019-03-29 ENCOUNTER — Encounter: Payer: BC Managed Care – PPO | Admitting: Gastroenterology

## 2019-03-29 ENCOUNTER — Ambulatory Visit (HOSPITAL_COMMUNITY): Payer: BC Managed Care – PPO | Admitting: Licensed Clinical Social Worker

## 2019-04-03 ENCOUNTER — Ambulatory Visit (HOSPITAL_BASED_OUTPATIENT_CLINIC_OR_DEPARTMENT_OTHER): Payer: BC Managed Care – PPO | Admitting: Genetic Counselor

## 2019-04-03 ENCOUNTER — Other Ambulatory Visit: Payer: Self-pay

## 2019-04-03 ENCOUNTER — Ambulatory Visit (INDEPENDENT_AMBULATORY_CARE_PROVIDER_SITE_OTHER): Payer: BC Managed Care – PPO | Admitting: *Deleted

## 2019-04-03 ENCOUNTER — Encounter: Payer: Self-pay | Admitting: Genetic Counselor

## 2019-04-03 DIAGNOSIS — Z86010 Personal history of colonic polyps: Secondary | ICD-10-CM

## 2019-04-03 DIAGNOSIS — F1021 Alcohol dependence, in remission: Secondary | ICD-10-CM | POA: Diagnosis not present

## 2019-04-03 NOTE — Progress Notes (Signed)
REFERRING PROVIDER: Yetta Flock, MD [address] [place],  [place] [address]  PRIMARY PROVIDER:  [name], MD  PRIMARY REASON FOR VISIT:  1. History of colonic polyps      HISTORY OF PRESENT ILLNESS:  I connected with  Mr. Woodke on 04/03/2019 at 10 AM EDT by MyChart video conference and verified that I am speaking with the correct person using two identifiers.   Patient location: Home Provider location: [facility]  Mr. Wojton, a 44 y.o. male, was seen for a [place] cancer genetics consultation at the request of Dr. Havery Moros due to a personal history of polyposis and family history of cancer.  Mr. Tu presents to clinic today to discuss the possibility of a hereditary predisposition to cancer, genetic testing, and to further clarify his future cancer risks, as well as potential cancer risks for family members.   Mr. Lumbard is a 44 y.o. male with no personal history of cancer.  He reports having a lump removed from the side of his neck at age 53 that was originally thought to be cancerous, but determined in the end not to be.  He has multiple freckles on his torso and back.  CANCER HISTORY:  Oncology History   No history exists.      Past Medical History:  Diagnosis Date  . Alcohol use disorder, severe, in early remission ([place]) 03/30/2018  . Allergy   . Anxiety   . Bipolar disorder, in partial remission, most recent episode hypomanic ([place]) 03/30/2018  . Bleeding internal hemorrhoids 09/12/2013  . Cannabis dependence ([place]) 07/13/2018   Daily use  . Cervical disc disease 11/20/2014  . Chronic anxiety 07/13/2018  . Complication of anesthesia   . Depression   . GERD (gastroesophageal reflux disease)   . Hernia, inguinal, bilateral 02/24/2011  . High ankle sprain of right lower extremity 01/16/2015  . History of hiatal hernia   . Hx of hepatitis C 10/05/2017   -treated in 2019 -hepatology recommended no further surveillance needed except for LFTs with labs and  see hepatologist if elevated  . Hypertension   . Lipids abnormal 09/29/2013  . Pneumonia   . PONV (postoperative nausea and vomiting)     Past Surgical History:  Procedure Laterality Date  . ANTERIOR CRUCIATE LIGAMENT REPAIR  2010  . BIOPSY  03/13/2019   Procedure: BIOPSY;  Surgeon: Yetta Flock, MD;  Location: [facility];  Service: Gastroenterology;;  . COLONOSCOPY N/A 03/13/2019   Procedure: COLONOSCOPY;  Surgeon: Yetta Flock, MD;  Location: [facility];  Service: Gastroenterology;  Laterality: N/A;  . INGUINAL HERNIA REPAIR  02/23/2012   Procedure: LAPAROSCOPIC BILATERAL INGUINAL HERNIA REPAIR;  Surgeon: Shann Medal, MD;  Location: [facility];  Service: General;  Laterality: Bilateral;  Laparoscopic Bilateral Inguinal Hernia Repair with mesh  . INSERTION OF MESH  02/23/2012   Procedure: INSERTION OF MESH;  Surgeon: Shann Medal, MD;  Location: [facility];  Service: General;  Laterality: N/A;  . NOSE SURGERY  [id number]  . POLYPECTOMY  03/13/2019   Procedure: POLYPECTOMY;  Surgeon: Yetta Flock, MD;  Location: [facility];  Service: Gastroenterology;;    Social History   Socioeconomic History  . Marital status: Single    Spouse name: Not on file  . Number of children: 0  . Years of education: Not on file  . Highest education level: Bachelor's degree (e.g., BA, AB, BS)  Occupational History  . Not on file  Tobacco Use  .  Smoking status: Former Research scientist (life sciences)  . Smokeless tobacco: Never Used  Substance and Sexual Activity  . Alcohol use: Yes    Alcohol/week: 4.0 standard drinks    Types: 4 Shots of liquor per week    Comment: (recovering alcoholic) current 4-5 drinks a week  . Drug use: Yes    Frequency: 2.0 times per week    Types: Marijuana  . Sexual activity: Yes    Birth control/protection: Condom  Other Topics Concern  . 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 Difficulty of Paying Living Expenses: Not on file  Food Insecurity:   . Worried About Charity fundraiser in the Last Year: Not on file  . Ran Out of Food in the Last Year: Not on file  Transportation Needs:   . Lack of Transportation (Medical): Not on file  . Lack of Transportation (Non-Medical): Not on file  Physical Activity:   . Days of Exercise per Week: Not on file  . Minutes of Exercise per Session: Not on file  Stress:   . Feeling of Stress : Not on file  Social Connections:   . Frequency of Communication with Friends and Family: Not on file  . Frequency of Social Gatherings with Friends and Family: Not on file  . Attends Religious Services: Not on file  . Active Member of Clubs or Organizations: Not on file  . Attends Archivist Meetings: Not on file  . Marital Status: Not on file     FAMILY HISTORY:  We obtained a detailed, 4-generation family history.  Significant diagnoses are listed below: Family History  Problem Relation Age of Onset  . Hypertension Mother   . Atrial fibrillation Mother   . Hypertension Father   . Leukemia Father 87  . Prostate cancer Father 42  . Diabetes Father   . Diabetes Brother   . Other Brother        growth on finger  . Learning disabilities Paternal Aunt   . Lung cancer Maternal Grandmother   . Heart attack Maternal Grandfather   . Congestive Heart Failure Paternal Grandmother   . Colon cancer Neg Hx     The patient does not have children.  He has a brother who recently had a growth on his finger that had to be removed.  Both parents are living.  The patient's father was diagnosed with CLL at 35 and prostate cancer at 67.  He has two sisters, one who had learning disabilities.  His parents are both deceased from non-cancer related issues.  The patient's  mother had colon polyps on her last colonoscopy.  She had 6 brothers and a sister who are all reportedly cancer free.  The maternal grandmother had lung cancer and the grandfather died of a heart attack.  Mr. Underdown is unaware of previous family history of genetic testing for hereditary cancer risks. Patient's maternal ancestors are of [place], [place] and [name] descent, and paternal ancestors are of [place] and [place] descent. There is no reported Ashkenazi Jewish ancestry. There is no known consanguinity.   GENETIC COUNSELING ASSESSMENT: Mr. Leverington is a 44 y.o.  male with a personal history of polyposis and family history of prostate cancer which is somewhat suggestive of a hereditary cancer syndrome and predisposition to cancer given his young age of onset of polyposis. We, therefore, discussed and recommended the following at today's visit.   DISCUSSION: We discussed that 5 - 10% of cancer is hereditary, with each type of cancer having its own risk for hereditary-ness.  Colon cancer can result from polyposis.  Polyposis is defined as having 10 or more colon polyps in a lifetime.  Thus far, Mr. Matzke has 12-13 polyps, and therefore qualifies with a diagnosis of polyposis.   We discussed that there are several reasons why someone may have polyposis, genetics is one reason.  There are several genes associated with polyposis hereditary conditions - the most common genes associated with this are APC and MUTYH. There are other genes that can be associated with hereditary polyposis cancer syndromes, although they are much more rare.  We discussed that testing is beneficial for several reasons including knowing how to follow individuals and understand if other family members could be at risk for cancer and allow them to undergo genetic testing.   We reviewed the characteristics, features and inheritance patterns of hereditary cancer syndromes. We also discussed genetic testing, including the appropriate family  members to test, the process of testing, insurance coverage and turn-around-time for results. We discussed the implications of a negative, positive, carrier and/or variant of uncertain significant result. We recommended Mr. Hinch pursue genetic testing for the common hereditary gene panel and colon cancer panel with preliminary evidence genes.  Based on Mr. Estella personal history of polyposis, he meets medical criteria for genetic testing. Despite that he meets criteria, he may still have an out of pocket cost. We discussed that if his out of pocket cost for testing is over $100, the laboratory will call and confirm whether he wants to proceed with testing.  If the out of pocket cost of testing is less than $100 he will be billed by the genetic testing laboratory.   PLAN: After considering the risks, benefits, and limitations, Mr. Sickel provided informed consent to pursue genetic testing and the blood sample was sent to [facility] for analysis of the common hereditary cancer panel and colorectal cancer panel with preliminary evidence genes. Results should be available within approximately 2-3 weeks' time, at which point they will be disclosed by telephone to Mr. Milici, as will any additional recommendations warranted by these results. Mr. Mcclish will receive a summary of his genetic counseling visit and a copy of his results once available. This information will also be available in Epic.   Lastly, we encouraged Mr. Holthaus to remain in contact with cancer genetics annually so that we can continuously update the family history and inform him of any changes in cancer genetics and testing that may be of benefit for this family.   Mr. Trullinger questions were answered to his satisfaction today. Our contact information was provided should additional questions or concerns arise. Thank you for the referral and allowing [name] to share in the care of your patient.   Ariane Ditullio P. Florene Glen, [place], [facility] Licensed,  Insurance risk surveyor [email or web address] phone: [phone]  The patient was seen for a total of 45 minutes in face-to-face genetic counseling.  This patient was discussed with Drs. Magrinat, Lindi Adie and/or Burr Medico who agrees with the above.    _______________________________________________________________________ For Office Staff:  Number of people involved in session: 1 Was an Intern/ student involved with  case: no

## 2019-04-03 NOTE — Progress Notes (Signed)
Pt in for overdue injection of Vivitrol 380mg  IM. Pt was appropriate and cooperative on approach. 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 Injection was given in left upper outer quadrant. Vss. Pt to return in approximately one month for next due injection. Pt verbalizes understanding.  Latuda 40mg  one moth supply given as we are awaiting PA for medication.

## 2019-04-04 ENCOUNTER — Telehealth: Payer: Self-pay | Admitting: Family Medicine

## 2019-04-04 ENCOUNTER — Other Ambulatory Visit: Payer: Self-pay

## 2019-04-04 ENCOUNTER — Inpatient Hospital Stay: Payer: BC Managed Care – PPO | Attending: Oncology

## 2019-04-04 DIAGNOSIS — Z86010 Personal history of colonic polyps: Secondary | ICD-10-CM | POA: Diagnosis not present

## 2019-04-04 DIAGNOSIS — Z8042 Family history of malignant neoplasm of prostate: Secondary | ICD-10-CM | POA: Diagnosis not present

## 2019-04-04 DIAGNOSIS — L989 Disorder of the skin and subcutaneous tissue, unspecified: Secondary | ICD-10-CM

## 2019-04-04 NOTE — Telephone Encounter (Signed)
Pt would like a referral to a dermatologist.  Pt has multiple spots and moles over his body and would like to see a specialist.   He would like a referral to someone with [facility]   Pt can be reached at [phone]

## 2019-04-04 NOTE — Telephone Encounter (Signed)
The referral was done  

## 2019-04-05 NOTE — Telephone Encounter (Signed)
Left a detailed message on verified voice mail.   

## 2019-04-06 ENCOUNTER — Ambulatory Visit (INDEPENDENT_AMBULATORY_CARE_PROVIDER_SITE_OTHER): Payer: BC Managed Care – PPO | Admitting: Psychiatry

## 2019-04-06 ENCOUNTER — Other Ambulatory Visit: Payer: Self-pay

## 2019-04-06 DIAGNOSIS — F3132 Bipolar disorder, current episode depressed, moderate: Secondary | ICD-10-CM | POA: Diagnosis not present

## 2019-04-06 DIAGNOSIS — F1021 Alcohol dependence, in remission: Secondary | ICD-10-CM | POA: Diagnosis not present

## 2019-04-06 MED ORDER — LITHIUM CARBONATE ER 450 MG PO TBCR: 900.0000 mg | EXTENDED_RELEASE_TABLET | Freq: Every evening | ORAL | 2 refills | Status: DC

## 2019-04-06 MED ORDER — LURASIDONE HCL 40 MG PO TABS: 40.0000 mg | ORAL_TABLET | Freq: Every day | ORAL | 1 refills | Status: DC

## 2019-04-06 NOTE — Progress Notes (Signed)
[place] MD/PA/NP OP Progress Note  04/06/2019 10:12 AM Shavez Mcgarty  MRN:  [id number] Interview was conducted by phone and I verified that I was speaking with the correct person using two identifiers. I discussed the limitations of evaluation and management by telemedicine and  the availability of in person appointments. Patient expressed understanding and agreed to proceed.  Chief Complaint: "I am feeling better".  HPI: 44yo male withBPAD and alcohol usedisorder severe and bipolar disorder. 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 Moodremains depressed and mildly anxious. Sleep and appetite are good. He does not use doxepin often(for sleep). Lithium dose was [place] 300 mg bid [place] now takes a whole dose at bedtime.Level is low - 0.3 mmol/l. He has complained of loss of libido and we decreased dose of Lexapro and eventually discontinued it. Wellbutrin was increased instead.He takesolanzapine prn anxiety, lithium for mood stabilization and we added Latuda for depression. He reports feeling less depressed since starting it. He had genetic testing done. 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  Visit Diagnosis:    ICD-10-CM   1. Bipolar 1 disorder, depressed, moderate ([place])  F31.32   2. Alcohol use disorder, severe, in early remission ([place])  F10.21     Past Psychiatric History: Please see intake H&P.  Past Medical History:  Past Medical History:  Diagnosis  Date  . Alcohol use disorder, severe, in early remission ([place]) 03/30/2018  . Allergy   . Anxiety   . Bipolar disorder, in partial remission, most recent episode hypomanic ([place]) 03/30/2018  . Bleeding internal hemorrhoids 09/12/2013  . Cannabis dependence ([place]) 07/13/2018   Daily use  . Cervical disc disease 11/20/2014  . Chronic anxiety 07/13/2018  . Complication of anesthesia   . Depression   . GERD (gastroesophageal reflux disease)   . Hernia, inguinal, bilateral 02/24/2011  . High ankle sprain of right lower extremity 01/16/2015  . History of hiatal hernia   . Hx of hepatitis C 10/05/2017   -treated in 2019 -hepatology recommended no further surveillance needed except for LFTs with labs and see hepatologist if elevated  . Hypertension   . Lipids abnormal 09/29/2013  . Pneumonia   . PONV (postoperative nausea and vomiting)     Past Surgical History:  Procedure Laterality Date  . ANTERIOR CRUCIATE LIGAMENT REPAIR  2010  . BIOPSY  03/13/2019   Procedure: BIOPSY;  Surgeon: Yetta Flock, MD;  Location: [facility];  Service: Gastroenterology;;  . COLONOSCOPY N/A 03/13/2019   Procedure: COLONOSCOPY;  Surgeon: Yetta Flock, MD;  Location: [facility];  Service: Gastroenterology;  Laterality: N/A;  . INGUINAL HERNIA REPAIR  02/23/2012   Procedure: LAPAROSCOPIC BILATERAL INGUINAL HERNIA REPAIR;  Surgeon: Shann Medal, MD;  Location: [facility];  Service: General;  Laterality: Bilateral;  Laparoscopic Bilateral Inguinal Hernia Repair with mesh  . INSERTION OF MESH  02/23/2012   Procedure: INSERTION OF MESH;  Surgeon: Shann Medal, MD;  Location: [facility];  Service: General;  Laterality: N/A;  . NOSE SURGERY  [id number]  .  POLYPECTOMY  03/13/2019   Procedure: POLYPECTOMY;  Surgeon: Yetta Flock, MD;  Location: [facility];  Service: Gastroenterology;;    Family Psychiatric History: Reviewed.  Family History:  Family History  Problem Relation Age of Onset  . Hypertension  Mother   . Atrial fibrillation Mother   . Hypertension Father   . Leukemia Father 77  . Prostate cancer Father 53  . Diabetes Father   . Diabetes Brother   . Other Brother        growth on finger  . Learning disabilities Paternal Aunt   . Lung cancer Maternal Grandmother   . Heart attack Maternal Grandfather   . Congestive Heart Failure Paternal Grandmother   . Colon cancer Neg Hx     Social History:  Social History   Socioeconomic History  . Marital status: Single    Spouse name: Not on file  . Number of children: 0  . Years of education: Not on file  . Highest education level: Bachelor's degree (e.g., BA, AB, BS)  Occupational History  . Not on file  Tobacco Use  . Smoking status: Former Research scientist (life sciences)  . Smokeless tobacco: Never Used  Substance and Sexual Activity  . Alcohol use: Yes    Alcohol/week: 4.0 standard drinks    Types: 4 Shots of liquor per week    Comment: (recovering alcoholic) current 4-5 drinks a week  . Drug use: Yes    Frequency: 2.0 times per week    Types: Marijuana  . Sexual activity: Yes    Birth control/protection: Condom  Other Topics Concern  . 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 Difficulty of Paying Living Expenses: Not on file  Food Insecurity:   . Worried About Charity fundraiser in the Last Year: Not on file  . Ran Out of Food in the Last Year: Not on file  Transportation Needs:   . Lack of Transportation (Medical): Not on file  . Lack of Transportation (Non-Medical): Not on file  Physical Activity:   . Days of Exercise per Week: Not on file  . Minutes of Exercise per Session: Not on file  Stress:   . Feeling of Stress : Not on file  Social Connections:   . Frequency of  Communication with Friends and Family: Not on file  . Frequency of Social Gatherings with Friends and Family: Not on file  . Attends Religious Services: Not on file  . Active Member of Clubs or Organizations: Not on file  . Attends Archivist Meetings: Not on file  . Marital Status: Not on file    Allergies:  Allergies  Allergen Reactions  . Hydrocodone     UPSETS STOMACH    Metabolic Disorder Labs: No results found for: HGBA1C, MPG No results found for: PROLACTIN Lab Results  Component Value Date   CHOL 241 (H) 09/19/2018   TRIG 316.0 (H) 09/19/2018   HDL 47.00 09/19/2018   CHOLHDL 5 09/19/2018   VLDL 63.2 (H) 09/19/2018   Lab Results  Component Value Date   TSH 0.74 03/31/2016   TSH 0.48 09/20/2013    Therapeutic  Level Labs: Lab Results  Component Value Date   LITHIUM 0.3 (L) 03/08/2019   LITHIUM 0.6 12/18/2016   No results found for: VALPROATE No components found for:  CBMZ  Current Medications: Current Outpatient Medications  Medication Sig Dispense Refill  . buPROPion (WELLBUTRIN XL) 300 MG 24 hr tablet Take 1 tablet (300 mg total) by mouth daily. 90 tablet 1  . hydrochlorothiazide (HYDRODIURIL) 25 MG tablet Take 1 tablet (25 mg total) by mouth daily. 90 tablet 3  . lithium carbonate (ESKALITH) 450 MG CR tablet Take 2 tablets (900 mg total) by mouth at bedtime. 60 tablet 2  . losartan (COZAAR) 100 MG tablet TAKE 1 TABLET BY MOUTH EVERY DAY (Patient taking differently: Take 100 mg by mouth daily. ) 90 tablet 3  . lurasidone (LATUDA) 40 MG TABS tablet Take 1 tablet (40 mg total) by mouth daily at 6 PM. 30 tablet 1  . metoprolol tartrate (LOPRESSOR) 25 MG tablet Take 1 tablet (25 mg total) by mouth 2 (two) times daily. 60 tablet 1  . OLANZapine (ZYPREXA) 10 MG tablet Take 5 mg by mouth daily.    . Omeprazole 20 MG TBEC Take 20 mg by mouth daily.     . pravastatin (PRAVACHOL) 10 MG tablet TAKE 1 TABLET BY MOUTH EVERY DAY (Patient taking differently: Take  10 mg by mouth daily. ) 90 tablet 3  . valACYclovir (VALTREX) 1000 MG tablet Take 1 tablet (1,000 mg total) by mouth 2 (two) times daily as needed (fever blisters). 60 tablet 5  . VIVITROL 380 MG SUSR Inject 380 mg into the muscle every 30 (thirty) days.     Current Facility-Administered Medications  Medication Dose Route Frequency Provider Last Rate Last Admin  . Naltrexone SUSR 380 mg  380 mg Intramuscular Q30 days Avontae Burkhead A, MD   380 mg at 04/03/19 1137    Psychiatric Specialty Exam: Review of Systems  Psychiatric/Behavioral: The patient is nervous/anxious.   All other systems reviewed and are negative.   There were no vitals taken for this visit.There is no height or weight on file to calculate BMI.  General Appearance: NA  Eye Contact:  NA  Speech:  Clear and Coherent and Normal Rate  Volume:  Normal  Mood:  Anxious  Affect:  NA  Thought Process:  Goal Directed and Linear  Orientation:  Full (Time, Place, and Person)  Thought Content: Logical   Suicidal Thoughts:  No  Homicidal Thoughts:  No  Memory:  Immediate;   Good Recent;   Good Remote;   Good  Judgement:  Good  Insight:  Good  Psychomotor Activity:  Normal  Concentration:  Concentration: Good  Recall:  Good  Fund of Knowledge: Good  Language: Good  Akathisia:  Negative  Handed:  Right  AIMS (if indicated): not done  Assets:  Communication Skills Desire for Improvement Financial Resources/Insurance Housing Physical Health Talents/Skills  ADL's:  Intact  Cognition: WNL  Sleep:  Good   Screenings: AUDIT     Counselor from 07/11/2018 in [place]  Alcohol Use Disorder Identification Test Final Score (AUDIT)  30    GAD-7     Counselor from 07/11/2018 in [place]  Total GAD-7 Score  9    PHQ2-9     Counselor from 07/11/2018 in [place]  PHQ-2 Total Score  2  PHQ-9 Total Score  8        Assessment and Plan: 44yo male  withBPAD and alcohol usedisorder severe and bipolar disorder. 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 Moodremains depressed and mildly anxious. Sleep and appetite are good. He does not use doxepin often(for sleep). Lithium dose was increased to 300 mg bid while in [place] now takes a whole dose at bedtime.Level is low - 0.3 mmol/l. He has complained of loss of libido and we decreased dose of Lexapro and eventually discontinued it. Wellbutrin was increased instead.He takesolanzapine prn anxiety, lithium for mood stabilization and we added Latuda for depression. He reports feeling less depressed since starting it. He had genetic testing done. 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  Dx: Alcohol use disorder severe in partial remission; BPAD, depressed mild to moderate  Plan:ContinueWellbutrin XL 300 mg,increase lithium CR to 900 mg at HS,olanzapine 5 mg prn anxiety and Latuda 40 mg at HS. Continue counseling with [facility].Return to clinic in two months.The plan was discussed with patient who had an opportunity to ask questions and these were all answered. I spend20 minutes inphone consultationwith the patient.    Stephanie Acre, MD 04/06/2019, 10:12 AM

## 2019-04-10 ENCOUNTER — Ambulatory Visit (INDEPENDENT_AMBULATORY_CARE_PROVIDER_SITE_OTHER): Payer: BC Managed Care – PPO | Admitting: Licensed Clinical Social Worker

## 2019-04-10 ENCOUNTER — Encounter (HOSPITAL_COMMUNITY): Payer: Self-pay | Admitting: Licensed Clinical Social Worker

## 2019-04-10 ENCOUNTER — Telehealth (HOSPITAL_COMMUNITY): Payer: Self-pay

## 2019-04-10 ENCOUNTER — Other Ambulatory Visit: Payer: Self-pay

## 2019-04-10 DIAGNOSIS — L814 Other melanin hyperpigmentation: Secondary | ICD-10-CM | POA: Diagnosis not present

## 2019-04-10 DIAGNOSIS — D1801 Hemangioma of skin and subcutaneous tissue: Secondary | ICD-10-CM | POA: Diagnosis not present

## 2019-04-10 DIAGNOSIS — F3132 Bipolar disorder, current episode depressed, moderate: Secondary | ICD-10-CM | POA: Diagnosis not present

## 2019-04-10 DIAGNOSIS — F1220 Cannabis dependence, uncomplicated: Secondary | ICD-10-CM | POA: Diagnosis not present

## 2019-04-10 DIAGNOSIS — D229 Melanocytic nevi, unspecified: Secondary | ICD-10-CM | POA: Diagnosis not present

## 2019-04-10 DIAGNOSIS — F1020 Alcohol dependence, uncomplicated: Secondary | ICD-10-CM

## 2019-04-10 DIAGNOSIS — L821 Other seborrheic keratosis: Secondary | ICD-10-CM | POA: Diagnosis not present

## 2019-04-10 NOTE — Telephone Encounter (Signed)
BLUE CROSS BLUE SHIELD OF [place] PRESCRIPTION COVERAGE DENIED  LATUDA 40MG  TABLET REFERENCE # [id number]

## 2019-04-10 NOTE — Progress Notes (Signed)
Virtual Visit via Video Note  I connected with Thomas Williamson on 04/10/19 at 10:00 AM EST by a video enabled telemedicine application and verified that I am speaking with the correct person using two identifiers.   I discussed the limitations of evaluation and management by telemedicine and the availability of in person appointments. The patient expressed understanding and agreed to proceed.  History of Present Illness: Pt is referred to individual therapy after completion of CD-IOP for alcoholism. Pt has previously been to rehab facilities ([place] and [name]).    Observations/Objective: Pt presented  depressed for his webex individual therapy session.  Patient described his psychiatric symptoms and current life events. Patient reports he relapsed a week ago due to the results of his colonoscopy ( precancerous polyps). Discussed more effective copings skills than drinking. Pt continues with Vivatrol shot so when he drank he did not feel the effects of the alcohol. Educated patient on the effects of the vivatrol shot. Patient has moved in completely with his parents and has started Art gallery manager school in [place]. He reports his marijuana use has diminished due to his lack of $. Patient continues going to meetings but has not found a sponsor. Patent completed his step 1 booklet. Began discussion of powerlessness, reframing his negative thoughts/words. Suggested patient continue to work on his  gratitude workbook, reviewed his gratitudes. Discussed the importance of gratitude. Patient met who Dr. Mamie Nick, psychiatrist, who increased his lithium, after reviewing his lab work completed.   PLAN:  genetic test, step 1, "Rewiring your brain"  Reframing your brain informational sheet  Assessment and Plan: Counselor will continue to meet with patient to address treatment plan goals. Patient will continue to follow recommendations of providers and implement skills learned in session.  Follow Up Instructions: I discussed the  assessment and treatment plan with the patient. The patient was provided an opportunity to ask questions and all were answered. The patient agreed with the plan and demonstrated an understanding of the instructions.   The patient was advised to call back or seek an in-person evaluation if the symptoms worsen or if the condition fails to improve as anticipated.  I provided 60 minutes of non-face-to-face time during this encounter.   Ashleyanne Hemmingway S, LCAS

## 2019-04-17 ENCOUNTER — Encounter (HOSPITAL_COMMUNITY): Payer: Self-pay | Admitting: Licensed Clinical Social Worker

## 2019-04-17 ENCOUNTER — Ambulatory Visit (INDEPENDENT_AMBULATORY_CARE_PROVIDER_SITE_OTHER): Payer: BC Managed Care – PPO | Admitting: Licensed Clinical Social Worker

## 2019-04-17 ENCOUNTER — Other Ambulatory Visit: Payer: Self-pay

## 2019-04-17 DIAGNOSIS — F1020 Alcohol dependence, uncomplicated: Secondary | ICD-10-CM

## 2019-04-17 DIAGNOSIS — F3132 Bipolar disorder, current episode depressed, moderate: Secondary | ICD-10-CM | POA: Diagnosis not present

## 2019-04-17 NOTE — Progress Notes (Signed)
Virtual Visit via Video Note  I connected with Thomas Williamson on 04/17/19 at 10:00 AM EDT by a video enabled telemedicine application and verified that I am speaking with the correct person using two identifiers.   I discussed the limitations of evaluation and management by telemedicine and the availability of in person appointments. The patient expressed understanding and agreed to proceed.  History of Present Illness: Pt is referred to individual therapy after completion of CD-IOP for alcoholism. Pt has previously been to rehab facilities ([place] and [name]).    Observations/Objective: Pt presented  depressed for his webex individual therapy session.  Patient described his psychiatric symptoms and current life events. Patient discussed his depressive symptoms and coping skills. Continued review of step 1 worksheet: powerlessness and trying to control alcohol. Again, suggested patient continue to work on his  gratitude workbook, reviewed his gratitudes.  Discussed patient's recovery efforts continuing to encourage patient to find a sponsor.  PLAN:  genetic test, step 1, "Rewiring your brain"  Reframing your brain informational sheet  Assessment and Plan: Counselor will continue to meet with patient to address treatment plan goals. Patient will continue to follow recommendations of providers and implement skills learned in session.  Follow Up Instructions: I discussed the assessment and treatment plan with the patient. The patient was provided an opportunity to ask questions and all were answered. The patient agreed with the plan and demonstrated an understanding of the instructions.   The patient was advised to call back or seek an in-person evaluation if the symptoms worsen or if the condition fails to improve as anticipated.  I provided 60 minutes of non-face-to-face time during this encounter.   Brieanne Mignone S, LCAS

## 2019-04-19 ENCOUNTER — Other Ambulatory Visit (HOSPITAL_COMMUNITY): Payer: Self-pay | Admitting: Psychiatry

## 2019-04-21 ENCOUNTER — Telehealth: Payer: Self-pay | Admitting: Genetic Counselor

## 2019-04-21 ENCOUNTER — Encounter: Payer: Self-pay | Admitting: Genetic Counselor

## 2019-04-21 ENCOUNTER — Ambulatory Visit: Payer: Self-pay | Admitting: Genetic Counselor

## 2019-04-21 DIAGNOSIS — Z1379 Encounter for other screening for genetic and chromosomal anomalies: Secondary | ICD-10-CM

## 2019-04-21 DIAGNOSIS — Z86010 Personal history of colonic polyps: Secondary | ICD-10-CM

## 2019-04-21 NOTE — Telephone Encounter (Signed)
LM on VM that results are back and to please call.  Left CB instructions. 

## 2019-04-21 NOTE — Progress Notes (Signed)
HPI:  Thomas Williamson. Thomas Williamson was previously seen in the [facility] due to a personal history of polyposis and family history of cancer and concerns regarding a hereditary predisposition to cancer. Please refer to our prior cancer genetics clinic note for more information regarding our discussion, assessment and recommendations, at the time. Thomas Williamson recent genetic test results were disclosed to him, as were recommendations warranted by these results. These results and recommendations are discussed in more detail below.  CANCER HISTORY:  Oncology History   No history exists.    FAMILY HISTORY:  We obtained a detailed, 4-generation family history.  Significant diagnoses are listed below: Family History  Problem Relation Age of Onset  . Hypertension Mother   . Atrial fibrillation Mother   . Hypertension Father   . Leukemia Father 65  . Prostate cancer Father 35  . Diabetes Father   . Diabetes Brother   . Other Brother        growth on finger  . Learning disabilities Paternal Aunt   . Lung cancer Maternal Grandmother   . Heart attack Maternal Grandfather   . Congestive Heart Failure Paternal Grandmother   . Colon cancer Neg Hx     The patient does not have children.  He has a brother who recently had a growth on his finger that had to be removed.  Both parents are living.  The patient's father was diagnosed with CLL at 41 and prostate cancer at 58.  He has two sisters, one who had learning disabilities.  His parents are both deceased from non-cancer related issues.  The patient's mother had colon polyps on her last colonoscopy.  She had 6 brothers and a sister who are all reportedly cancer free.  The maternal grandmother had lung cancer and the grandfather died of a heart attack.  Thomas Williamson is unaware of previous family history of genetic testing for hereditary cancer risks. Patient's maternal ancestors are of [place], [place] and [name] descent, and paternal ancestors are  of [place] and [place] descent. There is no reported Ashkenazi Jewish ancestry. There is no known consanguinity.   GENETIC TEST RESULTS: Genetic testing reported out on April 18, 2019 through the common hereditary cancer panel and the preliminary colorectal cancer gene cancer panel found no pathogenic mutations. The test report has been scanned into EPIC and is located under the Molecular Pathology section of the Results Review tab.  A portion of the result report is included below for reference.     We discussed with Thomas Williamson that because current genetic testing is not perfect, it is possible there may be a gene mutation in one of these genes that current testing cannot detect, but that chance is small.  We also discussed, that there could be another gene that has not yet been discovered, or that we have not yet tested, that is responsible for the cancer diagnoses in the family. It is also possible there is a hereditary cause for the cancer in the family that Thomas Williamson did not inherit and therefore was not identified in his testing.  Therefore, it is important to remain in touch with cancer genetics in the future so that we can continue to offer Thomas Williamson the most up to date genetic testing.   Genetic testing did identify a variant of uncertain significance (VUS) was identified in the APC gene called [id number].  At this time, it is unknown if this variant is associated with increased cancer risk or if this is  a normal finding, but most variants such as this get reclassified to being inconsequential. In fact, several laboratories already call it 'Likely Benign". It should not be used to make medical management decisions. With time, we suspect the lab will determine the significance of this variant, if any. If we do learn more about it, we will try to contact Thomas Williamson to discuss it further. However, it is important to stay in touch with [name] periodically and keep the address and phone number up to  date.  ADDITIONAL GENETIC TESTING: We discussed with Thomas Williamson that there are other genes that are associated with increased cancer risk that can be analyzed. Should Thomas Williamson wish to pursue additional genetic testing, we are happy to discuss and coordinate this testing, at any time.    CANCER SCREENING RECOMMENDATIONS: Thomas Williamson test result is considered negative (normal).  This means that we have not identified a hereditary cause for his personal history of polyposis and family history of cancer at this time. Most cancers happen by chance and this negative test suggests that his cancer may fall into this category.    While reassuring, this does not definitively rule out a hereditary predisposition to cancer. It is still possible that there could be genetic mutations that are undetectable by current technology. There could be genetic mutations in genes that have not been tested or identified to increase cancer risk.  Therefore, it is recommended he continue to follow the cancer management and screening guidelines provided by his primary healthcare provider.   An individual's cancer risk and medical management are not determined by genetic test results alone. Overall cancer risk assessment incorporates additional factors, including personal medical history, family history, and any available genetic information that may result in a personalized plan for cancer prevention and surveillance  This negative genetic test simply tells [name] that we cannot yet define why Thomas Williamson has had an increased number of colorectal polyps. Thomas Williamson medical management and screening should be based on the prospect that he will likely form more colon polyps in the future and should, therefore, undergo more frequent colonoscopy screening at intervals determined by his GI providers.  We also recommended that Thomas Williamson have an upper endoscopy periodically.  RECOMMENDATIONS FOR FAMILY MEMBERS:  Individuals in this family might  be at some increased risk of developing cancer, over the general population risk, simply due to the family history of cancer.  We recommended women in this family have a yearly mammogram beginning at age 46, or 62 years younger than the earliest onset of cancer, an annual clinical breast exam, and perform monthly breast self-exams. Women in this family should also have a gynecological exam as recommended by their primary provider. All family members should have a colonoscopy by age 7.  FOLLOW-UP: Lastly, we discussed with Thomas Williamson that cancer genetics is a rapidly advancing field and it is possible that new genetic tests will be appropriate for him and/or his family members in the future. We encouraged him to remain in contact with cancer genetics on an annual basis so we can update his personal and family histories and let him know of advances in cancer genetics that may benefit this family.   Our contact number was provided. Thomas Williamson. [place] questions were answered to his satisfaction, and he knows he is welcome to call us at anytime with additional questions or concerns.   [name], [place], [facility] Licensed, Certified Genetic Counselor [email or web address]

## 2019-04-21 NOTE — Telephone Encounter (Signed)
Revealed negative genetic testing.  Discussed that we do not know why he has polyposis or why there is cancer in the family. It could be due to a different gene that we are not testing, or maybe our current technology may not be able to pick something up.  It will be important for him to keep in contact with genetics to keep up with whether additional testing may be needed.  ? ? ?

## 2019-04-24 ENCOUNTER — Ambulatory Visit (INDEPENDENT_AMBULATORY_CARE_PROVIDER_SITE_OTHER): Payer: BC Managed Care – PPO | Admitting: Licensed Clinical Social Worker

## 2019-04-24 ENCOUNTER — Other Ambulatory Visit: Payer: Self-pay

## 2019-04-24 ENCOUNTER — Encounter (HOSPITAL_COMMUNITY): Payer: Self-pay | Admitting: Licensed Clinical Social Worker

## 2019-04-24 DIAGNOSIS — F1020 Alcohol dependence, uncomplicated: Secondary | ICD-10-CM | POA: Diagnosis not present

## 2019-04-24 DIAGNOSIS — F3132 Bipolar disorder, current episode depressed, moderate: Secondary | ICD-10-CM

## 2019-04-24 NOTE — Progress Notes (Signed)
Virtual Visit via Video Note  I connected with Thomas Williamson on 04/24/19 at 10:00 AM EDT by a video enabled telemedicine application and verified that I am speaking with the correct person using two identifiers.   I discussed the limitations of evaluation and management by telemedicine and the availability of in person appointments. The patient expressed understanding and agreed to proceed.  History of Present Illness: Pt is referred to individual therapy after completion of CD-IOP for alcoholism. Pt has previously been to rehab facilities ([facility] and [name]).    Observations/Objective: Pt presented  depressed for his webex individual therapy session.  Patient described his psychiatric symptoms and current life events. Patient discussed his depressive symptoms and coping skills. Reviewed gratitude  workbook, where patient discussed his daily gratitudes and how it is helping him to have a more positive attitude.  Discussed patient's recovery efforts continuing to encourage patient to find a sponsor, what meetings he's going to in person and virtually, what to look for in a sponsor, how to start a conversation about sponsorship.  PLAN:  Gratitude workbook,step 1, "Rewiring your brain"   Assessment and Plan: Counselor will continue to meet with patient to address treatment plan goals. Patient will continue to follow recommendations of providers and implement skills learned in session.  Follow Up Instructions: I discussed the assessment and treatment plan with the patient. The patient was provided an opportunity to ask questions and all were answered. The patient agreed with the plan and demonstrated an understanding of the instructions.   The patient was advised to call back or seek an in-person evaluation if the symptoms worsen or if the condition fails to improve as anticipated.  I provided 60 minutes of non-face-to-face time during this encounter.   Alonza Knisley S, LCAS

## 2019-05-01 ENCOUNTER — Ambulatory Visit (INDEPENDENT_AMBULATORY_CARE_PROVIDER_SITE_OTHER): Payer: BC Managed Care – PPO | Admitting: Licensed Clinical Social Worker

## 2019-05-01 ENCOUNTER — Other Ambulatory Visit: Payer: Self-pay

## 2019-05-01 ENCOUNTER — Encounter (HOSPITAL_COMMUNITY): Payer: Self-pay | Admitting: Licensed Clinical Social Worker

## 2019-05-01 DIAGNOSIS — F3132 Bipolar disorder, current episode depressed, moderate: Secondary | ICD-10-CM

## 2019-05-01 DIAGNOSIS — F1020 Alcohol dependence, uncomplicated: Secondary | ICD-10-CM

## 2019-05-01 NOTE — Progress Notes (Signed)
Virtual Visit via Video Note  I connected with Trenda Moots on 05/01/19 at 10:00 AM EDT by a video enabled telemedicine application and verified that I am speaking with the correct person using two identifiers.   I discussed the limitations of evaluation and management by telemedicine and the availability of in person appointments. The patient expressed understanding and agreed to proceed.  History of Present Illness: Pt is referred to individual therapy after completion of CD-IOP for alcoholism. Pt has previously been to rehab facilities ([facility] and [name]).    Observations/Objective: Pt presented  depressed for his webex individual therapy session.  Patient described his psychiatric symptoms and current life events. Patient discussed his depressive symptoms and coping skills. Patient reports, "I'm tired of AA meetings." Explored with patient his drinking history (rehab, sober living, leaving sober living, enabling parents, no sponsor, not working steps, dislike of AA meetings, trying to control drinking, unwillingness to do something different.)   PLAN:  Gratitude workbook,step 1, "Rewiring your brain"   Assessment and Plan: Counselor will continue to meet with patient to address treatment plan goals. Patient will continue to follow recommendations of providers and implement skills learned in session.  Follow Up Instructions: I discussed the assessment and treatment plan with the patient. The patient was provided an opportunity to ask questions and all were answered. The patient agreed with the plan and demonstrated an understanding of the instructions.   The patient was advised to call back or seek an in-person evaluation if the symptoms worsen or if the condition fails to improve as anticipated.  I provided 60 minutes of non-face-to-face time during this encounter.   Shiron Whetsel S, LCAS

## 2019-05-03 ENCOUNTER — Other Ambulatory Visit (HOSPITAL_COMMUNITY): Payer: Self-pay | Admitting: *Deleted

## 2019-05-03 ENCOUNTER — Telehealth (HOSPITAL_COMMUNITY): Payer: Self-pay | Admitting: *Deleted

## 2019-05-03 ENCOUNTER — Other Ambulatory Visit (HOSPITAL_COMMUNITY): Payer: Self-pay | Admitting: Psychiatry

## 2019-05-03 NOTE — Telephone Encounter (Signed)
Writer left message on pt VM asking him to try to call [facility] @ [phone] and  [facility] @ [phone] to try to obtain Vivitrol refills as writer has called several different Vivitrol roviders and was told pt is not enrolled in any of these programs. On speaking with [facility] could not be given any information as they had a different zip code for pt then what clinic has on file. Possibly pt will be able to provide pharmacy or [facility] with the information they need.

## 2019-05-03 NOTE — Telephone Encounter (Signed)
Understood 

## 2019-05-04 ENCOUNTER — Telehealth (HOSPITAL_COMMUNITY): Payer: Self-pay | Admitting: *Deleted

## 2019-05-04 NOTE — Telephone Encounter (Signed)
Writer spoke with pt after receiving t/c from [facility] regarding pt Vivitrol shipment. [facility] to overnight medication. Pt is willing to come in for injection tomorrow. Writer to call pt when shipment arrives. Pt verbalizes understanding.

## 2019-05-04 NOTE — Telephone Encounter (Signed)
Thank you for keeping  updated.

## 2019-05-08 ENCOUNTER — Ambulatory Visit (INDEPENDENT_AMBULATORY_CARE_PROVIDER_SITE_OTHER): Payer: BC Managed Care – PPO

## 2019-05-08 ENCOUNTER — Encounter (HOSPITAL_COMMUNITY): Payer: Self-pay

## 2019-05-08 ENCOUNTER — Other Ambulatory Visit: Payer: Self-pay

## 2019-05-08 ENCOUNTER — Ambulatory Visit (HOSPITAL_COMMUNITY): Payer: BC Managed Care – PPO | Admitting: Licensed Clinical Social Worker

## 2019-05-08 ENCOUNTER — Ambulatory Visit: Payer: BC Managed Care – PPO | Attending: Internal Medicine

## 2019-05-08 ENCOUNTER — Telehealth (HOSPITAL_COMMUNITY): Payer: Self-pay | Admitting: *Deleted

## 2019-05-08 VITALS — BP 107/73 | HR 73 | Resp 16 | Ht 70.0 in | Wt 200.8 lb

## 2019-05-08 DIAGNOSIS — F1021 Alcohol dependence, in remission: Secondary | ICD-10-CM

## 2019-05-08 DIAGNOSIS — Z23 Encounter for immunization: Secondary | ICD-10-CM

## 2019-05-08 NOTE — Telephone Encounter (Signed)
Writer called pt to inform him that his Vivitrol injection has arrived at this clinic. Pt states that he wasn't sure when he would be able to come in for it; possibly next week.

## 2019-05-08 NOTE — Progress Notes (Signed)
   Covid-19 Vaccination Clinic  Name:  Thomas Williamson    MRN: [id number] DOB: 1975-12-14  05/08/2019  [name] was observed post Covid-19 immunization for 15 minutes without incident. He was provided with Vaccine Information Sheet and instruction to access the V-Safe system.   [name] was instructed to call 911 with any severe reactions post vaccine: Marland Kitchen Difficulty breathing  . Swelling of face and throat  . A fast heartbeat  . A bad rash all over body  . Dizziness and weakness   Immunizations Administered    Name Date Dose VIS Date Route   Pfizer COVID-19 Vaccine 05/08/2019  1:51 PM 0.3 mL 01/13/2019 Intramuscular   Manufacturer: [place]   Lot: [id number] [id number]   [place]: [id number]

## 2019-05-08 NOTE — Telephone Encounter (Signed)
Timing is not absolutely essential for Vivitrol as long as he does come next week it would be OK.

## 2019-05-08 NOTE — Progress Notes (Signed)
Pt in for injection of Vivitrol 380mg  IM. Pt was appropriate and cooperative for duration of visit. Injection was given in right upper outer quadrant. Pt to return in approximately one month for next due injection. Pt verbalizes understanding. Pt denies complaints or concerns at this time.

## 2019-05-14 ENCOUNTER — Other Ambulatory Visit (HOSPITAL_COMMUNITY): Payer: Self-pay | Admitting: Psychiatry

## 2019-05-15 ENCOUNTER — Ambulatory Visit (INDEPENDENT_AMBULATORY_CARE_PROVIDER_SITE_OTHER): Payer: BC Managed Care – PPO | Admitting: Licensed Clinical Social Worker

## 2019-05-15 ENCOUNTER — Other Ambulatory Visit: Payer: Self-pay

## 2019-05-15 ENCOUNTER — Encounter (HOSPITAL_COMMUNITY): Payer: Self-pay | Admitting: Licensed Clinical Social Worker

## 2019-05-15 DIAGNOSIS — F3132 Bipolar disorder, current episode depressed, moderate: Secondary | ICD-10-CM

## 2019-05-15 DIAGNOSIS — F1020 Alcohol dependence, uncomplicated: Secondary | ICD-10-CM

## 2019-05-15 NOTE — Progress Notes (Signed)
Virtual Visit via Video Note  I connected with Thomas Williamson on 05/15/19 at  2:00 PM EDT by a video enabled telemedicine application and verified that I am speaking with the correct person using two identifiers.   I discussed the limitations of evaluation and management by telemedicine and the availability of in person appointments. The patient expressed understanding and agreed to proceed.  History of Present Illness: Pt is referred to individual therapy after completion of CD-IOP for alcoholism. Pt has previously been to rehab facilities ([name] and [name]).    Observations/Objective: Pt presented  depressed for his webex individual therapy session.  Patient described his psychiatric symptoms and current life events. Patient discussed his depressive symptoms and coping skills. Patient continues to report he is tired of the repetition of [name]. What are you willing to do different this time, trying to stay sober? Provided psychoeducation on "Dry Drunk" and "Environmental education officer."  PLAN:  Gratitude workbook,step 1, "Rewiring your brain"   Assessment and Plan: Counselor will continue to meet with patient to address treatment plan goals. Patient will continue to follow recommendations of providers and implement skills learned in session.  Follow Up Instructions: I discussed the assessment and treatment plan with the patient. The patient was provided an opportunity to ask questions and all were answered. The patient agreed with the plan and demonstrated an understanding of the instructions.   The patient was advised to call back or seek an in-person evaluation if the symptoms worsen or if the condition fails to improve as anticipated.  I provided 60 minutes of non-face-to-face time during this encounter.   Angelynn Lemus S, LCAS

## 2019-05-18 ENCOUNTER — Telehealth (HOSPITAL_COMMUNITY): Payer: Self-pay

## 2019-05-18 NOTE — Telephone Encounter (Signed)
Pt called requesting Latuda 40mg  samples as he is out and insurance is still not covering prescription.   Pt arrived and Medication samples provided to pt. Pt provided with 28 tablets of Latuda 40mg .  LOT: [id number] Exp. Date: 07/04/2022  The patient has been instructed regarding the correct time, dose, and frequency of taking this medication, including desired effects and most common side effects.

## 2019-05-22 ENCOUNTER — Encounter (HOSPITAL_COMMUNITY): Payer: Self-pay | Admitting: Licensed Clinical Social Worker

## 2019-05-22 ENCOUNTER — Other Ambulatory Visit: Payer: Self-pay

## 2019-05-22 ENCOUNTER — Ambulatory Visit (INDEPENDENT_AMBULATORY_CARE_PROVIDER_SITE_OTHER): Payer: BC Managed Care – PPO | Admitting: Licensed Clinical Social Worker

## 2019-05-22 DIAGNOSIS — F1020 Alcohol dependence, uncomplicated: Secondary | ICD-10-CM

## 2019-05-22 DIAGNOSIS — F3132 Bipolar disorder, current episode depressed, moderate: Secondary | ICD-10-CM

## 2019-05-22 NOTE — Progress Notes (Signed)
Virtual Visit via Video Note  I connected with Thomas Williamson on 05/22/19 at  2:00 PM EDT by a video enabled telemedicine application and verified that I am speaking with the correct person using two identifiers.   I discussed the limitations of evaluation and management by telemedicine and the availability of in person appointments. The patient expressed understanding and agreed to proceed.  History of Present Illness: Pt is referred to individual therapy after completion of CD-IOP for alcoholism. Pt has previously been to rehab facilities ([facility] and [name]).    Observations/Objective: Pt presented  depressed for his webex individual therapy session.  Patient described his psychiatric symptoms and current life events. Patient discussed his depressive symptoms and coping skills. Reviewed tx plan with patient who verbalized acceptance of the plan. Patient discussed his recovery efforts. 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 Ued CBT to assist patient with his feelings and thoughts of fear. Continued to review gratitude journal.  PLAN:  Gratitude workbook,step 1, "Rewiring your brain"   Assessment and Plan: Counselor will continue to meet with patient to address treatment plan goals. Patient will continue to follow recommendations of providers and implement skills learned in session.  Follow Up Instructions: I discussed the assessment and treatment plan with the patient. The patient was provided an opportunity to ask questions and all were answered. The patient agreed with the plan and demonstrated an understanding of the instructions.   The patient was advised to call back or seek an in-person evaluation if the symptoms worsen or if the condition fails to improve as anticipated.  I provided 45 minutes of non-face-to-face time during this encounter.   Raechell Singleton S, LCAS

## 2019-05-29 ENCOUNTER — Other Ambulatory Visit: Payer: Self-pay

## 2019-05-29 ENCOUNTER — Ambulatory Visit (INDEPENDENT_AMBULATORY_CARE_PROVIDER_SITE_OTHER): Payer: BC Managed Care – PPO | Admitting: Licensed Clinical Social Worker

## 2019-05-29 ENCOUNTER — Encounter (HOSPITAL_COMMUNITY): Payer: Self-pay | Admitting: Licensed Clinical Social Worker

## 2019-05-29 DIAGNOSIS — F3132 Bipolar disorder, current episode depressed, moderate: Secondary | ICD-10-CM | POA: Diagnosis not present

## 2019-05-29 DIAGNOSIS — F1020 Alcohol dependence, uncomplicated: Secondary | ICD-10-CM | POA: Diagnosis not present

## 2019-05-29 NOTE — Progress Notes (Signed)
Virtual Visit via Video Note  I connected with Trenda Moots on 05/29/19 at  2:00 PM EDT by a video enabled telemedicine application and verified that I am speaking with the correct person using two identifiers.   I discussed the limitations of evaluation and management by telemedicine and the availability of in person appointments. The patient expressed understanding and agreed to proceed.  History of Present Illness: Pt is referred to individual therapy after completion of CD-IOP for alcoholism. Pt has previously been to rehab facilities ([facility] and [name]).    Observations/Objective: Pt presented  depressed for his webex individual therapy session.  Patient described his psychiatric symptoms and current life events. Patient discussed his depressive symptoms and coping skills. Patient discussed his recovery efforts: AA meeting attendance (minimum). Patient discussed his medications and suggested he talk to Dr. Mamie Nick at his appointment next week. Reminded patient to make appointment for next Vivatrol shot and check into requirement for next lithium check. Cln discussed "change" assisting him with letting go of dysfunctional relationship patterns, irrational beliefs and self-sabotaging behaviors and helping him to replace them with a more positive, conscious and being proactive.  Marland Kitchen  PLAN:  Gratitude workbook,step 1, "Rewiring your brain"   Assessment and Plan: Counselor will continue to meet with patient to address treatment plan goals. Patient will continue to follow recommendations of providers and implement skills learned in session.  Follow Up Instructions: I discussed the assessment and treatment plan with the patient. The patient was provided an opportunity to ask questions and all were answered. The patient agreed with the plan and demonstrated an understanding of the instructions.   The patient was advised to call back or seek an in-person evaluation if the symptoms worsen or if the  condition fails to improve as anticipated.  I provided 60 minutes of non-face-to-face time during this encounter.   Nasia Cannan S, LCAS

## 2019-05-31 ENCOUNTER — Ambulatory Visit: Payer: BC Managed Care – PPO | Attending: Internal Medicine

## 2019-05-31 DIAGNOSIS — Z23 Encounter for immunization: Secondary | ICD-10-CM

## 2019-05-31 NOTE — Progress Notes (Signed)
   Covid-19 Vaccination Clinic  Name:  Thomas Williamson    MRN: [id number] DOB: [birth year]  05/31/2019  Mr. Panjwani was observed post Covid-19 immunization for 15 minutes without incident. He was provided with Vaccine Information Sheet and instruction to access the V-Safe system.   Mr. Seemann was instructed to call 911 with any severe reactions post vaccine: Marland Kitchen Difficulty breathing  . Swelling of face and throat  . A fast heartbeat  . A bad rash all over body  . Dizziness and weakness   Immunizations Administered    Name Date Dose VIS Date Route   Pfizer COVID-19 Vaccine 05/31/2019  1:28 PM 0.3 mL 03/29/2018 Intramuscular   Manufacturer: [place]   Lot: [id number]   [place]: [id number]

## 2019-06-05 ENCOUNTER — Other Ambulatory Visit: Payer: Self-pay

## 2019-06-05 ENCOUNTER — Telehealth (INDEPENDENT_AMBULATORY_CARE_PROVIDER_SITE_OTHER): Payer: BC Managed Care – PPO | Admitting: Psychiatry

## 2019-06-05 ENCOUNTER — Encounter: Payer: Self-pay | Admitting: Family Medicine

## 2019-06-05 ENCOUNTER — Ambulatory Visit (INDEPENDENT_AMBULATORY_CARE_PROVIDER_SITE_OTHER): Payer: BC Managed Care – PPO | Admitting: Family Medicine

## 2019-06-05 VITALS — BP 120/60 | HR 60 | Temp 97.6°F | Wt 198.4 lb

## 2019-06-05 DIAGNOSIS — K6289 Other specified diseases of anus and rectum: Secondary | ICD-10-CM

## 2019-06-05 DIAGNOSIS — F3176 Bipolar disorder, in full remission, most recent episode depressed: Secondary | ICD-10-CM

## 2019-06-05 DIAGNOSIS — F1021 Alcohol dependence, in remission: Secondary | ICD-10-CM

## 2019-06-05 MED ORDER — LITHIUM CARBONATE ER 300 MG PO TBCR: 600.0000 mg | EXTENDED_RELEASE_TABLET | Freq: Every evening | ORAL | 0 refills | Status: DC

## 2019-06-05 MED ORDER — BUPROPION HCL ER (XL) 300 MG PO TB24: 300.0000 mg | ORAL_TABLET | Freq: Every day | ORAL | 1 refills | Status: DC

## 2019-06-05 MED ORDER — MESALAMINE 1000 MG RE SUPP
1000.0000 mg | Freq: Every day | RECTAL | 1 refills | Status: DC
Start: 2019-06-05 — End: 2019-06-27

## 2019-06-05 NOTE — Progress Notes (Signed)
   Subjective:    Patient ID: Thomas Williamson, male    DOB: [birth year], 44 y.o.   MRN: [id number]  HPI Here for one week of mild pain at the anus, especially when sitting for prolonged periods. His BMs are regular but they are slightly uncomfortable to pass. No blood has been seen. He has tried an OTC cream with no success. Of note he had a colonoscopy in February which revealed some internal hemorrhoids along with some polyps.    Review of Systems  Constitutional: Negative.   Respiratory: Negative.   Cardiovascular: Negative.   Gastrointestinal: Positive for rectal pain. Negative for abdominal distention, abdominal pain, anal bleeding, blood in stool, constipation, diarrhea, nausea and vomiting.       Objective:   Physical Exam Constitutional:      Appearance: Normal appearance. He is not ill-appearing.  Cardiovascular:     Rate and Rhythm: Normal rate and regular rhythm.     Pulses: Normal pulses.     Heart sounds: Normal heart sounds.  Pulmonary:     Effort: Pulmonary effort is normal.     Breath sounds: Normal breath sounds.  Abdominal:     General: Abdomen is flat. Bowel sounds are normal. There is no distension.     Palpations: Abdomen is soft. There is no mass.     Tenderness: There is no abdominal tenderness. There is no guarding or rebound.     Hernia: No hernia is present.  Genitourinary:    Rectum: Normal.     Comments: External anal area is clear  Neurological:     Mental Status: He is alert.           Assessment & Plan:  He likely has inflammation of internal hemorrhoids. Try Canasa suppositories every night for 1-2 weeks.  Alysia Penna, MD

## 2019-06-05 NOTE — Progress Notes (Addendum)
[place] MD/PA/NP OP Progress Note  06/05/2019 9:39 AM Thomas Williamson  MRN:  [id number] Interview was conducted by phone and I verified that I was speaking with the correct person using two identifiers. I discussed the limitations of evaluation and management by telemedicine and  the availability of in person appointments. Patient expressed understanding and agreed to proceed. Patient location - home; physician - home office.  Chief Complaint: "My mood has been pretty good".  HPI: 44yo male withBPAD and alcohol usedisorder severe and bipolar disorder. 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 Moodremains depressed andmildly anxious. Sleep and appetitearegood. He does not use doxepin often(for sleep). Lithium dose [place] 300 mg [place] now takes a whole dose at bedtime.Level is low - 0.3 mmol/l. But mood has been stable. When we tried to increased dose to 900 mg he reported feeling nauseated and decreased dose back to previous one. He has complained of loss of libido and we decreased dose of Lexapro and eventually discontinued it. Wellbutrin was increased instead.He takesolanzapine prn anxiety, lithium for mood stabilization and we added Latuda for depression. He reports feeling less depressed since starting it. 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   Visit Diagnosis:    ICD-10-CM   1. Alcohol use  disorder, severe, in early remission ([place])  F10.21   2. Bipolar 1 disorder, depressed, full remission ([place])  F31.76     Past Psychiatric History: Please see intake H&P.  Past Medical History:  Past Medical History:  Diagnosis Date  . Alcohol use disorder, severe, in early remission ([place]) 03/30/2018  . Allergy   . Anxiety   . Bipolar disorder, in partial remission, most recent episode hypomanic ([place]) 03/30/2018  . Bleeding internal hemorrhoids 09/12/2013  . Cannabis dependence ([place]) 07/13/2018   Daily use  . Cervical disc disease 11/20/2014  . Chronic anxiety 07/13/2018  . Complication of anesthesia   . Depression   . GERD (gastroesophageal reflux disease)   . Hernia, inguinal, bilateral 02/24/2011  . High ankle sprain of right lower extremity 01/16/2015  . History of hiatal hernia   . Hx of hepatitis C 10/05/2017   -treated in 2019 -hepatology recommended no further surveillance needed except for LFTs with labs and see hepatologist if elevated  . Hypertension   . Lipids abnormal 09/29/2013  . Pneumonia   . PONV (postoperative nausea and vomiting)     Past Surgical History:  Procedure Laterality Date  . ANTERIOR CRUCIATE LIGAMENT REPAIR  2010  . BIOPSY  03/13/2019   Procedure: BIOPSY;  Surgeon: Yetta Flock, MD;  Location: [facility];  Service: Gastroenterology;;  . COLONOSCOPY N/A 03/13/2019   Procedure: COLONOSCOPY;  Surgeon: Yetta Flock, MD;  Location: [facility];  Service: Gastroenterology;  Laterality: N/A;  . INGUINAL HERNIA REPAIR  02/23/2012   Procedure: LAPAROSCOPIC BILATERAL INGUINAL HERNIA REPAIR;  Surgeon: Shann Medal, MD;  Location: [facility];  Service: General;  Laterality: Bilateral;  Laparoscopic Bilateral Inguinal Hernia Repair with mesh  .  INSERTION OF MESH  02/23/2012   Procedure: INSERTION OF MESH;  Surgeon: Shann Medal, MD;  Location: [facility];  Service: General;  Laterality: N/A;  . NOSE SURGERY  [id number]  . POLYPECTOMY  03/13/2019   Procedure:  POLYPECTOMY;  Surgeon: Yetta Flock, MD;  Location: [facility];  Service: Gastroenterology;;    Family Psychiatric History: Reviewed.  Family History:  Family History  Problem Relation Age of Onset  . Hypertension Mother   . Atrial fibrillation Mother   . Hypertension Father   . Leukemia Father 69  . Prostate cancer Father 52  . Diabetes Father   . Diabetes Brother   . Other Brother        growth on finger  . Learning disabilities Paternal Aunt   . Lung cancer Maternal Grandmother   . Heart attack Maternal Grandfather   . Congestive Heart Failure Paternal Grandmother   . Colon cancer Neg Hx     Social History:  Social History   Socioeconomic History  . Marital status: Single    Spouse name: Not on file  . Number of children: 0  . Years of education: Not on file  . Highest education level: Bachelor's degree (e.g., BA, AB, BS)  Occupational History  . Not on file  Tobacco Use  . Smoking status: Former Research scientist (life sciences)  . Smokeless tobacco: Never Used  Substance and Sexual Activity  . Alcohol use: Yes    Alcohol/week: 4.0 standard drinks    Types: 4 Shots of liquor per week    Comment: (recovering alcoholic) current 4-5 drinks a week  . Drug use: Yes    Frequency: 2.0 times per week    Types: Marijuana  . Sexual activity: Yes    Birth control/protection: Condom  Other Topics Concern  . 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 Difficulty of Paying Living Expenses:   Food Insecurity:   . Worried About Charity fundraiser in the Last Year:   . Arboriculturist in the Last Year:   Transportation Needs:   . Film/video editor (Medical):   Marland Kitchen Lack of Transportation (Non-Medical):   Physical Activity:    . Days of Exercise per Week:   . Minutes of Exercise per Session:   Stress:   . Feeling of Stress :   Social Connections:   . Frequency of Communication with Friends and Family:   . Frequency of Social Gatherings with Friends and Family:   . Attends Religious Services:   . Active Member of Clubs or Organizations:   . Attends Archivist Meetings:   Marland Kitchen Marital Status:     Allergies:  Allergies  Allergen Reactions  . Hydrocodone     UPSETS STOMACH    Metabolic Disorder Labs: No results found for: HGBA1C, MPG No results found for: PROLACTIN Lab Results  Component Value Date   CHOL 241 (H) 09/19/2018   TRIG 316.0 (H) 09/19/2018   HDL 47.00 09/19/2018   CHOLHDL 5 09/19/2018   VLDL 63.2 (H) 09/19/2018   Lab Results  Component Value Date   TSH 0.74 03/31/2016  TSH 0.48 09/20/2013    Therapeutic Level Labs: Lab Results  Component Value Date   LITHIUM 0.3 (L) 03/08/2019   LITHIUM 0.6 12/18/2016   No results found for: VALPROATE No components found for:  CBMZ  Current Medications: Current Outpatient Medications  Medication Sig Dispense Refill  . buPROPion (WELLBUTRIN XL) 300 MG 24 hr tablet Take 1 tablet (300 mg total) by mouth daily. 90 tablet 1  . hydrochlorothiazide (HYDRODIURIL) 25 MG tablet Take 1 tablet (25 mg total) by mouth daily. 90 tablet 3  . lithium carbonate (LITHOBID) 300 MG CR tablet Take 2 tablets (600 mg total) by mouth at bedtime. 180 tablet 0  . losartan (COZAAR) 100 MG tablet TAKE 1 TABLET BY MOUTH EVERY DAY (Patient taking differently: Take 100 mg by mouth daily. ) 90 tablet 3  . lurasidone (LATUDA) 40 MG TABS tablet Take 1 tablet (40 mg total) by mouth daily at 6 PM. 30 tablet 1  . metoprolol tartrate (LOPRESSOR) 25 MG tablet Take 1 tablet (25 mg total) by mouth 2 (two) times daily. 60 tablet 1  . OLANZapine (ZYPREXA) 10 MG tablet Take 0.5 tablets (5 mg total) by mouth daily as needed (anxiety). 45 tablet 0  . Omeprazole 20 MG TBEC Take  20 mg by mouth daily.     . pravastatin (PRAVACHOL) 10 MG tablet TAKE 1 TABLET BY MOUTH EVERY DAY (Patient taking differently: Take 10 mg by mouth daily. ) 90 tablet 3  . valACYclovir (VALTREX) 1000 MG tablet Take 1 tablet (1,000 mg total) by mouth 2 (two) times daily as needed (fever blisters). 60 tablet 5  . VIVITROL 380 MG SUSR Inject 380 mg into the muscle every 30 (thirty) days.     Current Facility-Administered Medications  Medication Dose Route Frequency Provider Last Rate Last Admin  . Naltrexone SUSR 380 mg  380 mg Intramuscular Q30 days Vanisha Whiten A, MD   380 mg at 05/08/19 1500      Psychiatric Specialty Exam: Review of Systems  All other systems reviewed and are negative.   There were no vitals taken for this visit.There is no height or weight on file to calculate BMI.  General Appearance: NA  Eye Contact:  NA  Speech:  Clear and Coherent and Normal Rate  Volume:  Normal  Mood:  Euthymic  Affect:  NA  Thought Process:  Goal Directed and Linear  Orientation:  Full (Time, Place, and Person)  Thought Content: Logical   Suicidal Thoughts:  No  Homicidal Thoughts:  No  Memory:  Immediate;   Good Recent;   Good Remote;   Good  Judgement:  Good  Insight:  Good  Psychomotor Activity:  NA  Concentration:  Concentration: Good  Recall:  Good  Fund of Knowledge: Good  Language: Good  Akathisia:  Negative  Handed:  Right  AIMS (if indicated): not done  Assets:  Communication Skills Desire for Improvement Financial Resources/Insurance [place] Talents/Skills  ADL's:  Intact  Cognition: WNL  Sleep:  Good   Screenings: AUDIT     Counselor from 07/11/2018 in [place]  Alcohol Use Disorder Identification Test Final Score (AUDIT)  30    GAD-7     Counselor from 07/11/2018 in [place]  Total GAD-7 Score  9    PHQ2-9     Counselor from 07/11/2018 in  [place]  PHQ-2 Total Score  2  PHQ-9 Total Score  8  Assessment and Plan: 44yo male withBPAD and alcohol usedisorder severe and bipolar disorder. 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 Moodremains depressed andmildly anxious. Sleep and appetitearegood. He does not use doxepin often(for sleep). Lithium dose was increased to 300 mg bid [place] in [place] now takes a whole dose at bedtime.Level is low - 0.3 mmol/l. But mood has been stable. When we tried to increased dose to 900 mg he reported feeling nauseated and decreased dose back to previous one. He has complained of loss of libido and we decreased dose of Lexapro and eventually discontinued it. Wellbutrin was increased instead.He takesolanzapine prn anxiety, lithium for mood stabilization and we added Latuda for depression. He reports feeling less depressed since starting it. 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  Dx: Alcohol use disorder severein partial remission; BPAD, depressed mild to moderate  Plan:ContinueVivitrol 380 mg IM monthly, Wellbutrin XL 300 mg,lithium CR 600 mg at HS,olanzapine 5 mg prn anxiety and Latuda 40 mg at HS (gets samples of the latter).Continue counseling with [facility].Return to clinic in three months.The plan was discussed with patient who had an opportunity to ask  questions and these were all answered. I spend20 minutes inphone consultationwith the patient.    Stephanie Acre, MD 06/05/2019, 9:39 AM

## 2019-06-06 ENCOUNTER — Telehealth (HOSPITAL_COMMUNITY): Payer: Self-pay | Admitting: *Deleted

## 2019-06-06 NOTE — Telephone Encounter (Signed)
I just spoke with him about it another day and he assured me that he will keep injections current.

## 2019-06-06 NOTE — Telephone Encounter (Signed)
Writer called pt with reminder that Vivitrol injection iis due and to call clinic or this nurse with any questions or concerns.

## 2019-06-08 ENCOUNTER — Ambulatory Visit (INDEPENDENT_AMBULATORY_CARE_PROVIDER_SITE_OTHER): Payer: BC Managed Care – PPO | Admitting: Adult Health

## 2019-06-08 ENCOUNTER — Other Ambulatory Visit: Payer: Self-pay

## 2019-06-08 ENCOUNTER — Encounter: Payer: Self-pay | Admitting: Adult Health

## 2019-06-08 VITALS — BP 108/80 | Temp 97.1°F | Wt 200.0 lb

## 2019-06-08 DIAGNOSIS — K648 Other hemorrhoids: Secondary | ICD-10-CM

## 2019-06-08 MED ORDER — HYDROCORTISONE ACETATE 25 MG RE SUPP: 25.0000 mg | Freq: Two times a day (BID) | RECTAL | 1 refills | Status: DC

## 2019-06-08 NOTE — Progress Notes (Signed)
Subjective:    Patient ID: Thomas Williamson, male    DOB: 1975/10/14, 44 y.o.   MRN: [id number]  HPI 44 year old male who  has a past medical history of Alcohol use disorder, severe, in early remission ([place]) (03/30/2018), Allergy, Anxiety, Bipolar disorder, in partial remission, most recent episode hypomanic ([place]) (03/30/2018), Bleeding internal hemorrhoids (09/12/2013), Cannabis dependence ([place]) (07/13/2018), Cervical disc disease (11/20/2014), Chronic anxiety ([place]), Complication of anesthesia, Depression, GERD (gastroesophageal reflux disease), Hernia, inguinal, bilateral (02/24/2011), High ankle sprain of right lower extremity (01/16/2015), History of hiatal hernia, hepatitis C (10/05/2017), Hypertension, Lipids abnormal (09/29/2013), Pneumonia, and PONV (postoperative nausea and vomiting).  He presents to the office today for an acute issue of rectal pain.  He was seen by his provider 3 days ago after having mild pain of the rectum for 1 week.  Pain was especially apparent when sitting for long periods of time.  He had tried over-the-counter hemorrhoid cream with no success.  His PCP prescribed him Canasa suppository.  Since  that time he has had no relief.  He reports very trace amounts of bright red blood at times when he has a bowel movement.  Bowel movements have been harder but this is normal for him.  He does report continued pain with sitting and bowel movements.  Did have a colonoscopy in February 2021 which showed some internal hemorrhoids along with polyps.   Review of Systems See HPI   Past Medical History:  Diagnosis Date  . Alcohol use disorder, severe, in early remission ([place]) 03/30/2018  . Allergy   . Anxiety   . Bipolar disorder, in partial remission, most recent episode hypomanic ([place]) 03/30/2018  . Bleeding internal hemorrhoids 09/12/2013  . Cannabis dependence ([place]) 07/13/2018   Daily use  . Cervical disc disease 11/20/2014  . Chronic anxiety 07/13/2018  . Complication of  anesthesia   . Depression   . GERD (gastroesophageal reflux disease)   . Hernia, inguinal, bilateral 02/24/2011  . High ankle sprain of right lower extremity 01/16/2015  . History of hiatal hernia   . Hx of hepatitis C 10/05/2017   -treated in 2019 -hepatology recommended no further surveillance needed except for LFTs with labs and see hepatologist if elevated  . Hypertension   . Lipids abnormal 09/29/2013  . Pneumonia   . PONV (postoperative nausea and vomiting)     Social History   Socioeconomic History  . Marital status: Single    Spouse name: Not on file  . Number of children: 0  . Years of education: Not on file  . Highest education level: Bachelor's degree (e.g., BA, AB, BS)  Occupational History  . Not on file  Tobacco Use  . Smoking status: Former Research scientist (life sciences)  . Smokeless tobacco: Never Used  Substance and Sexual Activity  . Alcohol use: Yes    Alcohol/week: 4.0 standard drinks    Types: 4 Shots of liquor per week    Comment: (recovering alcoholic) current 4-5 drinks a week  . Drug use: Yes    Frequency: 2.0 times per week    Types: Marijuana  . Sexual activity: Yes    Birth control/protection: Condom  Other Topics Concern  . 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 Difficulty of Paying Living Expenses:   Food Insecurity:   . Worried About Charity fundraiser in the Last Year:   . Arboriculturist in the Last Year:   Transportation Needs:   . Film/video editor (Medical):   Marland Kitchen Lack of Transportation (Non-Medical):   Physical Activity:   . Days of Exercise per Week:   . Minutes of Exercise per Session:   Stress:   . Feeling of Stress :   Social Connections:   . Frequency of Communication with Friends  and Family:   . Frequency of Social Gatherings with Friends and Family:   . Attends Religious Services:   . Active Member of Clubs or Organizations:   . Attends Archivist Meetings:   Marland Kitchen Marital Status:   Intimate Partner Violence:   . Fear of Current or Ex-Partner:   . Emotionally Abused:   Marland Kitchen Physically Abused:   . Sexually Abused:     Past Surgical History:  Procedure Laterality Date  . ANTERIOR CRUCIATE LIGAMENT REPAIR  2010  . BIOPSY  03/13/2019   Procedure: BIOPSY;  Surgeon: Yetta Flock, MD;  Location: [facility];  Service: Gastroenterology;;  . COLONOSCOPY N/A 03/13/2019   Procedure: COLONOSCOPY;  Surgeon: Yetta Flock, MD;  [facility];  Service: Gastroenterology;  Laterality: N/A;  . INGUINAL HERNIA REPAIR  02/23/2012   Procedure: LAPAROSCOPIC BILATERAL INGUINAL HERNIA REPAIR;  Surgeon: Shann Medal, MD;  [facility]: [facility];  Service: General;  Laterality: Bilateral;  Laparoscopic Bilateral Inguinal Hernia Repair with mesh  . INSERTION OF MESH  02/23/2012   Procedure: INSERTION OF MESH;  Surgeon: Shann Medal, MD;  [facility];  Service: General;  Laterality: N/A;  . NOSE SURGERY  [id number]  . POLYPECTOMY  03/13/2019   Procedure: POLYPECTOMY;  Surgeon: Yetta Flock, MD;  Location: [facility];  Service: Gastroenterology;;    Family History  Problem Relation Age of Onset  . Hypertension Mother   . Atrial fibrillation Mother   . Hypertension Father   . Leukemia Father 12  . Prostate cancer Father 22  . Diabetes Father   . Diabetes Brother   . Other Brother        growth on finger  . Learning disabilities Paternal Aunt   . Lung cancer Maternal Grandmother   . Heart attack Maternal Grandfather   . Congestive Heart Failure Paternal Grandmother   . Colon cancer Neg Hx     Allergies  Allergen Reactions  . Hydrocodone     UPSETS STOMACH    Current Outpatient Medications on File Prior to Visit  Medication Sig Dispense  Refill  . buPROPion (WELLBUTRIN XL) 300 MG 24 hr tablet Take 1 tablet (300 mg total) by mouth daily. 90 tablet 1  . hydrochlorothiazide (HYDRODIURIL) 25 MG tablet Take 1 tablet (25 mg total) by mouth daily. 90 tablet 3  . lithium carbonate (LITHOBID) 300 MG CR tablet Take 2 tablets (600 mg total) by mouth at bedtime. 180 tablet 0  . losartan (COZAAR) 100 MG tablet TAKE 1 TABLET BY MOUTH EVERY DAY (Patient taking differently: Take 100 mg by mouth daily. ) 90 tablet 3  . mesalamine (CANASA) 1000 MG suppository Place 1 suppository (1,000 mg total) rectally at bedtime. 30 suppository 1  . metoprolol tartrate (LOPRESSOR) 25 MG tablet Take 1 tablet (25 mg total) by mouth 2 (two)  times daily. 60 tablet 1  . OLANZapine (ZYPREXA) 10 MG tablet Take 0.5 tablets (5 mg total) by mouth daily as needed (anxiety). 45 tablet 0  . Omeprazole 20 MG TBEC Take 20 mg by mouth daily.     . pravastatin (PRAVACHOL) 10 MG tablet TAKE 1 TABLET BY MOUTH EVERY DAY (Patient taking differently: Take 10 mg by mouth daily. ) 90 tablet 3  . valACYclovir (VALTREX) 1000 MG tablet Take 1 tablet (1,000 mg total) by mouth 2 (two) times daily as needed (fever blisters). 60 tablet 5  . VIVITROL 380 MG SUSR Inject 380 mg into the muscle every 30 (thirty) days.    Marland Kitchen lurasidone (LATUDA) 40 MG TABS tablet Take 1 tablet (40 mg total) by mouth daily at 6 PM. 30 tablet 1   Current Facility-Administered Medications on File Prior to Visit  Medication Dose Route Frequency Provider Last Rate Last Admin  . Naltrexone SUSR 380 mg  380 mg Intramuscular Q30 days Pucilowski, Olgierd A, MD   380 mg at 05/08/19 1500    BP 108/80   Temp (!) 97.1 F (36.2 C)   Wt 200 lb (90.7 kg)   BMI 28.70 kg/m       Objective:   Physical Exam Vitals and nursing note reviewed.  Constitutional:      Appearance: Normal appearance.  Genitourinary:    Rectum: Guaiac result negative. Tenderness and internal hemorrhoid present. No external hemorrhoid. Abnormal  anal tone.  Neurological:     Mental Status: He is alert.       Assessment & Plan:  1. Internal hemorrhoid -We will switch from Canasa suppository to Anusol.  Encouraged high-fiber diet and plenty of water.  Follow-up with PCP if no improvement - hydrocortisone (ANUSOL-HC) 25 MG suppository; Place 1 suppository (25 mg total) rectally 2 (two) times daily.  Dispense: 12 suppository; Refill: 1  Dorothyann Peng, NP

## 2019-06-12 ENCOUNTER — Other Ambulatory Visit: Payer: Self-pay

## 2019-06-12 ENCOUNTER — Other Ambulatory Visit (HOSPITAL_COMMUNITY): Payer: Self-pay | Admitting: *Deleted

## 2019-06-12 ENCOUNTER — Encounter (HOSPITAL_COMMUNITY): Payer: Self-pay | Admitting: Licensed Clinical Social Worker

## 2019-06-12 ENCOUNTER — Ambulatory Visit (INDEPENDENT_AMBULATORY_CARE_PROVIDER_SITE_OTHER): Payer: BC Managed Care – PPO | Admitting: Licensed Clinical Social Worker

## 2019-06-12 DIAGNOSIS — F1020 Alcohol dependence, uncomplicated: Secondary | ICD-10-CM

## 2019-06-12 DIAGNOSIS — F3132 Bipolar disorder, current episode depressed, moderate: Secondary | ICD-10-CM

## 2019-06-12 MED ORDER — NALTREXONE HCL 50 MG PO TABS: 50.0000 mg | ORAL_TABLET | Freq: Every day | ORAL | 5 refills | Status: DC

## 2019-06-12 NOTE — Progress Notes (Signed)
Virtual Visit via Video Note  I connected with Thomas Williamson on 06/12/19 at  1:00 PM EDT by a video enabled telemedicine application and verified that I am speaking with the correct person using two identifiers.   I discussed the limitations of evaluation and management by telemedicine and the availability of in person appointments. The patient expressed understanding and agreed to proceed.  History of Present Illness: Pt is referred to individual therapy after completion of CD-IOP for alcoholism. Pt has previously been to rehab facilities ([place] and [name]).    Observations/Objective: Pt presented  depressed for his webex individual therapy session.  Patient described his psychiatric symptoms and current life events. Patient discussed his depressive symptoms and coping skills. Patient discussed his recovery efforts: AA meeting attendance (minimum). Patient discussed his appointment with Dr. Mamie Nick, psychiatrist. Patient does not want to take the VIvatrol shot anymore because it hurts his bottom. Cln provided psychoeducation on the benefits of Vivatrol, cravings vs thoughts. Patient also want to begin titrating down his Lithium because he doesn't believe he needs it anymore. Cln provided psychoeducation on a mood stabilizer for patients with bipolar disorder. Encouraged patient to continue with both the Vivatrol and Lithium for optimum mental health and recovery.   Thomas Williamson  PLAN:  Gratitude workbook,step 1, "Rewiring your brain"   Assessment and Plan: Counselor will continue to meet with patient to address treatment plan goals. Patient will continue to follow recommendations of providers and implement skills learned in session.  Follow Up Instructions: I discussed the assessment and treatment plan with the patient. The patient was provided an opportunity to ask questions and all were answered. The patient agreed with the plan and demonstrated an understanding of the instructions.   The patient was  advised to call back or seek an in-person evaluation if the symptoms worsen or if the condition fails to improve as anticipated.  I provided 60 minutes of non-face-to-face time during this encounter.   Fabrice Dyal S, LCAS

## 2019-06-19 ENCOUNTER — Other Ambulatory Visit: Payer: Self-pay

## 2019-06-19 ENCOUNTER — Encounter (HOSPITAL_COMMUNITY): Payer: Self-pay | Admitting: Licensed Clinical Social Worker

## 2019-06-19 ENCOUNTER — Ambulatory Visit (INDEPENDENT_AMBULATORY_CARE_PROVIDER_SITE_OTHER): Payer: BC Managed Care – PPO | Admitting: Licensed Clinical Social Worker

## 2019-06-19 DIAGNOSIS — F3132 Bipolar disorder, current episode depressed, moderate: Secondary | ICD-10-CM

## 2019-06-19 DIAGNOSIS — F1021 Alcohol dependence, in remission: Secondary | ICD-10-CM

## 2019-06-19 NOTE — Progress Notes (Signed)
Virtual Visit via Video Note  I connected with Trenda Moots on 06/19/19 at  1:00 PM EDT by a video enabled telemedicine application and verified that I am speaking with the correct person using two identifiers.   I discussed the limitations of evaluation and management by telemedicine and the availability of in person appointments. The patient expressed understanding and agreed to proceed.  History of Present Illness: Pt is referred to individual therapy after completion of CD-IOP for alcoholism. Pt has previously been to rehab facilities ([place] and [name]).    Observations/Objective: Pt presented  depressed for his webex individual therapy session.  Patient described his psychiatric symptoms and current life events. Patient discussed his depressive symptoms and coping skills. Patient discussed his recovery efforts: AA meeting attendance (minimum). Patient remains sober for 90 days. Patient discussed his classes, school work and in school class work (cutting hair). Cln again, provided psychoeducation on recovery plan. Cln congratulated pt on his 90 days of sobriety. Pt affirmed patient's continued success in school. Patient began the oral prescription for Vivatrol. Emailed patient AA step 2 workbook. Cln began discussion of step 2. Patient will continue to complete the step 2 workbook.      Marland Kitchen  PLAN:  step 2 "Rewiring your brain"   Assessment and Plan: Counselor will continue to meet with patient to address treatment plan goals. Patient will continue to follow recommendations of providers and implement skills learned in session.  Follow Up Instructions: I discussed the assessment and treatment plan with the patient. The patient was provided an opportunity to ask questions and all were answered. The patient agreed with the plan and demonstrated an understanding of the instructions.   The patient was advised to call back or seek an in-person evaluation if the symptoms worsen or if the  condition fails to improve as anticipated.  I provided 60 minutes of non-face-to-face time during this encounter.   Alexis Mizuno S, LCAS

## 2019-06-26 ENCOUNTER — Other Ambulatory Visit: Payer: Self-pay

## 2019-06-26 ENCOUNTER — Ambulatory Visit (HOSPITAL_COMMUNITY): Payer: BC Managed Care – PPO | Admitting: Licensed Clinical Social Worker

## 2019-06-27 ENCOUNTER — Other Ambulatory Visit: Payer: Self-pay | Admitting: Family Medicine

## 2019-06-30 ENCOUNTER — Other Ambulatory Visit: Payer: Self-pay | Admitting: *Deleted

## 2019-06-30 ENCOUNTER — Telehealth: Payer: Self-pay | Admitting: Family Medicine

## 2019-06-30 ENCOUNTER — Other Ambulatory Visit: Payer: Self-pay | Admitting: Adult Health

## 2019-06-30 DIAGNOSIS — K648 Other hemorrhoids: Secondary | ICD-10-CM

## 2019-06-30 DIAGNOSIS — K6289 Other specified diseases of anus and rectum: Secondary | ICD-10-CM

## 2019-06-30 MED ORDER — HYDROCORTISONE ACETATE 25 MG RE SUPP: 25.0000 mg | Freq: Two times a day (BID) | RECTAL | 0 refills | Status: DC

## 2019-06-30 MED ORDER — HYDROCHLOROTHIAZIDE 25 MG PO TABS: 25.0000 mg | ORAL_TABLET | Freq: Every day | ORAL | 3 refills | Status: DC

## 2019-06-30 NOTE — Telephone Encounter (Signed)
pt requseting a refill on medication  hydrochlorothiazide (HYDRODIURIL) 25 MG table  [id number] [address], [place] - [place]  Phone:  [phone] Fax:  [phone]

## 2019-06-30 NOTE — Telephone Encounter (Signed)
Rx sent in. Left a detailed message on verified voice mail.   

## 2019-06-30 NOTE — Telephone Encounter (Signed)
Spoke with patient. Patient reports he did not get a detailed message. Patient reports he needs hemorrhoids suppository refill. Patient apologized for calling so late to clinic because he thought the correct Rx was sent. Sent in one week refill. Patient reports he is still having problems and wants to know what the next step of care is.

## 2019-07-04 ENCOUNTER — Other Ambulatory Visit: Payer: Self-pay

## 2019-07-04 ENCOUNTER — Telehealth (INDEPENDENT_AMBULATORY_CARE_PROVIDER_SITE_OTHER): Payer: BC Managed Care – PPO | Admitting: Family Medicine

## 2019-07-04 DIAGNOSIS — J019 Acute sinusitis, unspecified: Secondary | ICD-10-CM | POA: Diagnosis not present

## 2019-07-04 DIAGNOSIS — R062 Wheezing: Secondary | ICD-10-CM

## 2019-07-04 MED ORDER — DOXYCYCLINE HYCLATE 100 MG PO CAPS
100.0000 mg | ORAL_CAPSULE | Freq: Two times a day (BID) | ORAL | 0 refills | Status: DC
Start: 2019-07-04 — End: 2019-11-15

## 2019-07-04 MED ORDER — PREDNISONE 20 MG PO TABS
ORAL_TABLET | ORAL | 0 refills | Status: DC
Start: 2019-07-04 — End: 2019-11-15

## 2019-07-04 NOTE — Telephone Encounter (Signed)
I did a referral to Dr. Havery Moros (GI) for the rectal pain

## 2019-07-04 NOTE — Progress Notes (Signed)
Patient ID: Thomas Williamson, male   DOB: 1976/01/03, 44 y.o.   MRN: [id number]   This visit type was conducted due to national recommendations for restrictions regarding the COVID-19 pandemic in an effort to limit this patient's exposure and mitigate transmission in our community.   Virtual Visit via Telephone Note  I connected with Thomas Williamson on 07/04/19 at  8:45 AM EDT by telephone and verified that I am speaking with the correct person using two identifiers.   I discussed the limitations, risks, security and privacy concerns of performing an evaluation and management service by telephone and the availability of in person appointments. I also discussed with the patient that there may be a patient responsible charge related to this service. The patient expressed understanding and agreed to proceed.  Location patient: home Location provider: work or home office Participants present for the call: patient, provider Patient did not have a visit in the prior 7 days to address this/these issue(s).   History of Present Illness: Keli called as a work in virtual visit with over 1 week ago history of cough and wheeze.  He states he has some sinus congestion and sinus pressure and occasional headaches consistent with previous episodes of sinusitis No history of asthma.  He has had some rhinorrhea.  Mostly clear but occasionally colored.  No sick contacts.  He has been fully vaccinated for Covid.  He has had no loss of taste or smell.  No significant dyspnea.  Cough is been fairly severe at times.  He states that he "always takes an antibiotic "to clear.  Former smoker.  No recent hemoptysis.  No appetite or weight changes.   Observations/Objective: Patient sounds cheerful and well on the phone. I do not appreciate any SOB. Speech and thought processing are grossly intact. Patient reported vitals:  Assessment and Plan:  Cough and wheeze.  Possible acute sinusitis  -Recommend prednisone 20 mg 2  tablets daily for 6 days and reviewed potential side effects -Stay well-hydrated -Doxycycline 100 mg twice daily for 7 days -Follow-up promptly for any fever, worsening, or persistent symptoms  Follow Up Instructions:  -As above   99441 5-10 [id number] 11-20 99443 21-30 I did not refer this patient for an OV in the next 24 hours for this/these issue(s).  I discussed the assessment and treatment plan with the patient. The patient was provided an opportunity to ask questions and all were answered. The patient agreed with the plan and demonstrated an understanding of the instructions.   The patient was advised to call back or seek an in-person evaluation if the symptoms worsen or if the condition fails to improve as anticipated.  I provided 12 minutes of non-face-to-face time during this encounter.   Carolann Littler, MD

## 2019-07-04 NOTE — Telephone Encounter (Signed)
Left message for patient to call back  

## 2019-07-04 NOTE — Telephone Encounter (Signed)
Called pt to advise of update but no answer. Vm never "beeped" for me to leave a message.

## 2019-07-04 NOTE — Addendum Note (Signed)
Addended by: Alysia Penna A on: 07/04/2019 12:51 PM   Modules accepted: Orders

## 2019-07-05 ENCOUNTER — Encounter: Payer: Self-pay | Admitting: Family Medicine

## 2019-07-24 ENCOUNTER — Ambulatory Visit (INDEPENDENT_AMBULATORY_CARE_PROVIDER_SITE_OTHER): Payer: BC Managed Care – PPO | Admitting: Licensed Clinical Social Worker

## 2019-07-24 ENCOUNTER — Other Ambulatory Visit: Payer: Self-pay

## 2019-07-24 ENCOUNTER — Encounter (HOSPITAL_COMMUNITY): Payer: Self-pay | Admitting: Licensed Clinical Social Worker

## 2019-07-24 DIAGNOSIS — F3132 Bipolar disorder, current episode depressed, moderate: Secondary | ICD-10-CM

## 2019-07-24 DIAGNOSIS — F1021 Alcohol dependence, in remission: Secondary | ICD-10-CM

## 2019-07-24 NOTE — Progress Notes (Addendum)
Virtual Visit via Video Note  I connected with Trenda Moots on 07/24/19 at  4:00 PM EDT by a video enabled telemedicine application and verified that I am speaking with the correct person using two identifiers.   I discussed the limitations of evaluation and management by telemedicine and the availability of in person appointments. The patient expressed understanding and agreed to proceed.  History of Present Illness: Pt is referred to individual therapy after completion of CD-IOP for alcoholism. Pt has previously been to rehab facilities ([place] and [name]).   LOCATION:  Patient Home Provider: Home Observations/Objective: Pt presented  depressed for his webex individual therapy session.  Patient described his psychiatric symptoms and current life events.Patient hasn't been to therapy in 5 weeks. "I decided I needed to come back to therapy because I feel it's helping me." Patient discussed his depressive symptoms and coping skills. "I think I'm taking too many medications." Asked open ended questions. Suggested patient talk to his psychiatrist and other medical providers.  Patient discussed his recovery efforts: AA meeting attendance (minimum). Patient reports sobriety for 4 months. Patient discussed his classes, school work and in school class work (cutting hair). Began discussion of step 2, patient will need to find his Big Book to move forward in step 2.   PLAN: CCA  .  PLAN:  step 2 "Rewiring your brain"   Assessment and Plan: Counselor will continue to meet with patient to address treatment plan goals. Patient will continue to follow recommendations of providers and implement skills learned in session.  Follow Up Instructions: I discussed the assessment and treatment plan with the patient. The patient was provided an opportunity to ask questions and all were answered. The patient agreed with the plan and demonstrated an understanding of the instructions.   The patient was advised  to call back or seek an in-person evaluation if the symptoms worsen or if the condition fails to improve as anticipated.  I provided 30 minutes of non-face-to-face time during this encounter.   Tabatha Razzano S, LCAS

## 2019-08-10 ENCOUNTER — Other Ambulatory Visit (HOSPITAL_COMMUNITY): Payer: Self-pay | Admitting: *Deleted

## 2019-08-10 MED ORDER — LURASIDONE HCL 40 MG PO TABS: 40.0000 mg | ORAL_TABLET | Freq: Every day | ORAL | 1 refills | Status: DC

## 2019-08-14 ENCOUNTER — Ambulatory Visit (HOSPITAL_COMMUNITY): Payer: BC Managed Care – PPO | Admitting: Licensed Clinical Social Worker

## 2019-08-28 ENCOUNTER — Other Ambulatory Visit (HOSPITAL_COMMUNITY): Payer: Self-pay | Admitting: *Deleted

## 2019-08-28 ENCOUNTER — Ambulatory Visit (HOSPITAL_COMMUNITY): Payer: BC Managed Care – PPO | Admitting: Licensed Clinical Social Worker

## 2019-08-28 MED ORDER — OLANZAPINE 10 MG PO TABS: 5.0000 mg | ORAL_TABLET | Freq: Every day | ORAL | 0 refills | Status: DC | PRN

## 2019-09-04 ENCOUNTER — Other Ambulatory Visit: Payer: Self-pay

## 2019-09-04 ENCOUNTER — Telehealth (INDEPENDENT_AMBULATORY_CARE_PROVIDER_SITE_OTHER): Payer: BC Managed Care – PPO | Admitting: Psychiatry

## 2019-09-04 DIAGNOSIS — F1021 Alcohol dependence, in remission: Secondary | ICD-10-CM | POA: Diagnosis not present

## 2019-09-04 DIAGNOSIS — F3176 Bipolar disorder, in full remission, most recent episode depressed: Secondary | ICD-10-CM | POA: Diagnosis not present

## 2019-09-04 MED ORDER — BUPROPION HCL ER (XL) 150 MG PO TB24: 150.0000 mg | ORAL_TABLET | Freq: Every day | ORAL | 2 refills | Status: DC

## 2019-09-04 MED ORDER — LITHIUM CARBONATE ER 300 MG PO TBCR: 600.0000 mg | EXTENDED_RELEASE_TABLET | Freq: Every evening | ORAL | 0 refills | Status: DC

## 2019-09-04 MED ORDER — OLANZAPINE 5 MG PO TABS: 5.0000 mg | ORAL_TABLET | Freq: Every day | ORAL | 0 refills | Status: DC | PRN

## 2019-09-04 NOTE — Progress Notes (Signed)
[facility] MD/PA/NP OP Progress Note  09/04/2019 9:42 AM Thomas Williamson  MRN:  [id number] Interview was conducted using videoconferencing application and I verified that I was speaking with the correct person using two identifiers. I discussed the limitations of evaluation and management by telemedicine and  the availability of in person appointments. Patient expressed understanding and agreed to proceed. Patient location - home; physician - home office.  Chief Complaint: "I am well".  HPI: 44yo male withBPAD and alcohol usedisorder severe and bipolar disorder. 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 He is however on oral naltrexone.He lives with his parents and drinks only occasionally, not losing control. Lithium dose [place] 300 mg [place] now takes a whole dose at bedtime.Level is low - 0.3 mmol/l. But mood has been stable. When we tried to increased dose to 900 mg he reported feeling nauseated and decreased dose back to previous one. He has complained of loss of libido and we decreased dose of Lexapro and eventually discontinued it. Wellbutrin was increased instead.He takesolanzapine 5 mg prn anxiety, lithium for mood stabilizationand we added Latuda for depression. 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 He would like to decrease number of medications he is on.   Visit Diagnosis:    ICD-10-CM   1. Bipolar 1  disorder, depressed, full remission ([place])  F31.76   2. Alcohol use disorder, severe, in early remission ([place])  F10.21     Past Psychiatric History: Please see intake H&P.  Past Medical History:  Past Medical History:  Diagnosis Date  . Alcohol use disorder, severe, in early remission ([place]) 03/30/2018  . Allergy   . Anxiety   . Bipolar disorder, in partial remission, most recent episode hypomanic ([place]) 03/30/2018  . Bleeding internal hemorrhoids 09/12/2013  . Cannabis dependence ([place]) 07/13/2018   Daily use  . Cervical disc disease 11/20/2014  . Chronic anxiety 07/13/2018  . Complication of anesthesia   . Depression   . GERD (gastroesophageal reflux disease)   . Hernia, inguinal, bilateral 02/24/2011  . High ankle sprain of right lower extremity 01/16/2015  . History of hiatal hernia   . Hx of hepatitis C 10/05/2017   -treated in 2019 -hepatology recommended no further surveillance needed except for LFTs with labs and see hepatologist if elevated  . Hypertension   . Lipids abnormal 09/29/2013  . Pneumonia   . PONV (postoperative nausea and vomiting)     Past Surgical History:  Procedure Laterality Date  . ANTERIOR CRUCIATE LIGAMENT REPAIR  2010  . BIOPSY  03/13/2019   Procedure: BIOPSY;  Surgeon: Yetta Flock, MD;  Location: [facility];  Service: Gastroenterology;;  . COLONOSCOPY N/A 03/13/2019   Procedure: COLONOSCOPY;  Surgeon: Yetta Flock, MD;  Location: [facility];  Service: Gastroenterology;  Laterality: N/A;  . INGUINAL HERNIA REPAIR  02/23/2012   Procedure: LAPAROSCOPIC BILATERAL INGUINAL HERNIA REPAIR;  Surgeon: Shann Medal, MD;  Location: [facility];  Service: General;  Laterality: Bilateral;  Laparoscopic Bilateral Inguinal  Hernia Repair with mesh  . INSERTION OF MESH  02/23/2012   Procedure: INSERTION OF MESH;  Surgeon: Shann Medal, MD;  Location: [facility];  Service: General;  Laterality: N/A;  . NOSE SURGERY  [id number]  . POLYPECTOMY  03/13/2019   Procedure:  POLYPECTOMY;  Surgeon: Yetta Flock, MD;  Location: [facility];  Service: Gastroenterology;;    Family Psychiatric History: None  Family History:  Family History  Problem Relation Age of Onset  . Hypertension Mother   . Atrial fibrillation Mother   . Hypertension Father   . Leukemia Father 54  . Prostate cancer Father 72  . Diabetes Father   . Diabetes Brother   . Other Brother        growth on finger  . Learning disabilities Paternal Aunt   . Lung cancer Maternal Grandmother   . Heart attack Maternal Grandfather   . Congestive Heart Failure Paternal Grandmother   . Colon cancer Neg Hx     Social History:  Social History   Socioeconomic History  . Marital status: Single    Spouse name: Not on file  . Number of children: 0  . Years of education: Not on file  . Highest education level: Bachelor's degree (e.g., BA, AB, BS)  Occupational History  . Not on file  Tobacco Use  . Smoking status: Former Research scientist (life sciences)  . Smokeless tobacco: Never Used  Vaping Use  . Vaping Use: Former  . Substances: CBD  Substance and Sexual Activity  . Alcohol use: Yes    Alcohol/week: 4.0 standard drinks    Types: 4 Shots of liquor per week    Comment: (recovering alcoholic) current 4-5 drinks a week  . Drug use: Yes    Frequency: 2.0 times per week    Types: Marijuana  . Sexual activity: Yes    Birth control/protection: Condom  Other Topics Concern  . 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 Difficulty of Paying Living Expenses:   Food Insecurity:   . Worried About Charity fundraiser in the Last Year:   . Arboriculturist in the Last Year:   Transportation Needs:   . Film/video editor (Medical):   Marland Kitchen Lack of  Transportation (Non-Medical):   Physical Activity:   . Days of Exercise per Week:   . Minutes of Exercise per Session:   Stress:   . Feeling of Stress :   Social Connections:   . Frequency of Communication with Friends and Family:   . Frequency of Social Gatherings with Friends and Family:   . Attends Religious Services:   . Active Member of Clubs or Organizations:   . Attends Archivist Meetings:   Marland Kitchen Marital Status:     Allergies:  Allergies  Allergen Reactions  . Hydrocodone     UPSETS STOMACH    Metabolic Disorder Labs: No results found for: HGBA1C, MPG No results found for: PROLACTIN Lab Results  Component Value Date   CHOL 241 (H) 09/19/2018   TRIG 316.0 (H) 09/19/2018   HDL 47.00 09/19/2018   CHOLHDL 5 09/19/2018   VLDL  63.2 (H) 09/19/2018   Lab Results  Component Value Date   TSH 0.74 03/31/2016   TSH 0.48 09/20/2013    Therapeutic Level Labs: Lab Results  Component Value Date   LITHIUM 0.3 (L) 03/08/2019   LITHIUM 0.6 12/18/2016   No results found for: VALPROATE No components found for:  CBMZ  Current Medications: Current Outpatient Medications  Medication Sig Dispense Refill  . buPROPion (WELLBUTRIN XL) 150 MG 24 hr tablet Take 1 tablet (150 mg total) by mouth daily. 30 tablet 2  . doxycycline (VIBRAMYCIN) 100 MG capsule Take 1 capsule (100 mg total) by mouth 2 (two) times daily. 14 capsule 0  . hydrochlorothiazide (HYDRODIURIL) 25 MG tablet Take 1 tablet (25 mg total) by mouth daily. 90 tablet 3  . hydrocortisone (ANUSOL-HC) 25 MG suppository Place 1 suppository (25 mg total) rectally 2 (two) times daily. 12 suppository 0  . lithium carbonate (LITHOBID) 300 MG CR tablet Take 2 tablets (600 mg total) by mouth at bedtime. 180 tablet 0  . losartan (COZAAR) 100 MG tablet TAKE 1 TABLET BY MOUTH EVERY DAY (Patient taking differently: Take 100 mg by mouth daily. ) 90 tablet 3  . lurasidone (LATUDA) 40 MG TABS tablet Take 1 tablet (40 mg total) by  mouth daily at 6 PM. 30 tablet 1  . mesalamine (CANASA) 1000 MG suppository PLACE 1 SUPPOSITORY (1,000 MG TOTAL) RECTALLY AT BEDTIME. 30 suppository 1  . metoprolol tartrate (LOPRESSOR) 25 MG tablet Take 1 tablet (25 mg total) by mouth 2 (two) times daily. 60 tablet 1  . naltrexone (DEPADE) 50 MG tablet Take 1 tablet (50 mg total) by mouth daily. 30 tablet 5  . OLANZapine (ZYPREXA) 5 MG tablet Take 1 tablet (5 mg total) by mouth daily as needed (anxiety). 90 tablet 0  . Omeprazole 20 MG TBEC Take 20 mg by mouth daily.     . pravastatin (PRAVACHOL) 10 MG tablet TAKE 1 TABLET BY MOUTH EVERY DAY (Patient taking differently: Take 10 mg by mouth daily. ) 90 tablet 3  . predniSONE (DELTASONE) 20 MG tablet Take 2 tablets once daily for 6 days 12 tablet 0  . valACYclovir (VALTREX) 1000 MG tablet Take 1 tablet (1,000 mg total) by mouth 2 (two) times daily as needed (fever blisters). 60 tablet 5   No current facility-administered medications for this visit.     Psychiatric Specialty Exam: Review of Systems  Psychiatric/Behavioral: The patient is nervous/anxious.   All other systems reviewed and are negative.   There were no vitals taken for this visit.There is no height or weight on file to calculate BMI.  General Appearance: Casual and Well Groomed  Eye Contact:  Good  Speech:  Clear and Coherent  Volume:  Normal  Mood:  Euthymic  Affect:  Full Range  Thought Process:  Goal Directed  Orientation:  Full (Time, Place, and Person)  Thought Content: Logical   Suicidal Thoughts:  No  Homicidal Thoughts:  No  Memory:  Immediate;   Good Recent;   Good Remote;   Good  Judgement:  Fair  Insight:  Fair  Psychomotor Activity:  Normal  Concentration:  Concentration: Good  Recall:  Good  Fund of Knowledge: Good  Language: Good  Akathisia:  Negative  Handed:  Right  AIMS (if indicated): not done  Assets:  Communication Skills Desire for Improvement Financial  Resources/Insurance Housing Social Support  ADL's:  Intact  Cognition: WNL  Sleep:  Good   Screenings: AUDIT  Counselor from 07/11/2018 in [place]  Alcohol Use Disorder Identification Test Final Score (AUDIT) 30    GAD-7     Counselor from 07/11/2018 in [place]  Total GAD-7 Score 9    PHQ2-9     Counselor from 07/11/2018 in [facility] [facility]  PHQ-2 Total Score 2  PHQ-9 Total Score 8       Assessment and Plan:  44yo male withBPAD and alcohol usedisorder severe and bipolar disorder. 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 He is however on oral naltrexone.He lives with his parents and drinks only occasionally, not losing control. Lithium dose was increased to 300 mg bid while in [place] now takes a whole dose at bedtime.Level is low - 0.3 mmol/l. But mood has been stable. When we tried to increased dose to 900 mg he reported feeling nauseated and decreased dose back to previous one. He has complained of loss of libido and we decreased dose of Lexapro and eventually discontinued it. Wellbutrin was increased instead.He takesolanzapine 5 mg prn anxiety, lithium for mood stabilizationand we added Latuda for depression. 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 He would like to decrease number of medications he is  on.  Dx: Alcohol use disorder severein partial remission; BPAD, depressedin remission  Plan:Continuenaltrexone 50 mg orally till the end of the year. Decrease Wellbutrin XL to 150 mg and if mood does not deteriorate in 4-6 weeks he could stop it. Continue lithium CR600mg  at HS,olanzapine5 mg prn anxiety and Latuda 40 mg at HS (gets samples of the latter).He stopped counseling with [facility].Return to clinic in three months.The plan was discussed with patient who had an opportunity to ask questions and these were all answered. I spend14minutes invideo visitwith the patient.    Stephanie Acre, MD 09/04/2019, 9:42 AM

## 2019-09-05 ENCOUNTER — Ambulatory Visit: Payer: BC Managed Care – PPO | Admitting: Gastroenterology

## 2019-09-05 NOTE — Progress Notes (Signed)
No show letter mailed to pt.

## 2019-09-26 ENCOUNTER — Other Ambulatory Visit (HOSPITAL_COMMUNITY): Payer: Self-pay | Admitting: Psychiatry

## 2019-10-22 ENCOUNTER — Other Ambulatory Visit (HOSPITAL_COMMUNITY): Payer: Self-pay | Admitting: Psychiatry

## 2019-10-23 ENCOUNTER — Other Ambulatory Visit (HOSPITAL_COMMUNITY): Payer: Self-pay | Admitting: *Deleted

## 2019-10-23 MED ORDER — LURASIDONE HCL 40 MG PO TABS: 40.0000 mg | ORAL_TABLET | Freq: Every day | ORAL | 0 refills | Status: DC

## 2019-11-05 ENCOUNTER — Other Ambulatory Visit: Payer: Self-pay | Admitting: Cardiology

## 2019-11-13 ENCOUNTER — Other Ambulatory Visit: Payer: Self-pay | Admitting: Family Medicine

## 2019-11-13 NOTE — Telephone Encounter (Signed)
What is the name of the medication? metoprolol tartrate (LOPRESSOR) 25 MG tablet [[id number]]   Have you contacted your pharmacy to request a refill? Yes, he is wanting Dr. Sarajane Jews to fill this script. His cardo doc usually fills this.   Which pharmacy would you [facility] sent to? [facility] [address] in [place].    Patient notified that their request is being sent to the clinical staff for review and that they should receive a call once it is complete. If they do not receive a call within 72 hours they can check with their pharmacy or our office.

## 2019-11-13 NOTE — Telephone Encounter (Signed)
Is it ok to refill?  Last OV:07/04/19 Last refilled by cardiology: 11/19/18

## 2019-11-14 MED ORDER — METOPROLOL TARTRATE 25 MG PO TABS: 25.0000 mg | ORAL_TABLET | Freq: Two times a day (BID) | ORAL | 11 refills | Status: DC

## 2019-11-15 ENCOUNTER — Encounter: Payer: Self-pay | Admitting: Family Medicine

## 2019-11-15 ENCOUNTER — Other Ambulatory Visit: Payer: Self-pay

## 2019-11-15 ENCOUNTER — Ambulatory Visit: Payer: BC Managed Care – PPO | Admitting: Family Medicine

## 2019-11-15 VITALS — BP 108/62 | HR 121 | Temp 98.1°F | Ht 70.0 in | Wt 194.6 lb

## 2019-11-15 DIAGNOSIS — R251 Tremor, unspecified: Secondary | ICD-10-CM

## 2019-11-15 MED ORDER — PROPRANOLOL HCL ER 80 MG PO CP24
80.0000 mg | ORAL_CAPSULE | Freq: Every day | ORAL | 2 refills | Status: DC
Start: 2019-11-15 — End: 2020-02-07

## 2019-11-15 NOTE — Progress Notes (Signed)
   Subjective:    Patient ID: Thomas Williamson, male    DOB: [birth year], 44 y.o.   MRN: [id number]  HPI Here for tremors. He first noticed these about 4 years ago, but they have become more pronounced in the past 3 months. This involves both hands, witht the right being the worst. He is right handed, and this has become a real problem. He has trouble writing legibly and feeding himself. 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 His hands shake so bad that he has trouble cutting the hair. He takes Metoprolol for his BP and his cardiomyopathy.    Review of Systems  Constitutional: Negative.   Respiratory: Negative.   Cardiovascular: Negative.   Neurological: Positive for tremors. Negative for weakness and numbness.       Objective:   Physical Exam Constitutional:      General: He is not in acute distress.    Appearance: Normal appearance.  Cardiovascular:     Rate and Rhythm: Normal rate and regular rhythm.     Pulses: Normal pulses.     Heart sounds: Normal heart sounds.  Pulmonary:     Effort: Pulmonary effort is normal.     Breath sounds: Normal breath sounds.  Neurological:     Mental Status: He is alert and oriented to person, place, and time.     Cranial Nerves: No cranial nerve deficit.     Motor: No weakness.     Gait: Gait normal.     Comments: Both hands show resting tremors            Assessment & Plan:  Tremors. We will refer him to Neurology to evaluate further. In  the meantime he will stop Metoprolol and try Propranolol LA 80 mg daily.  Alysia Penna, MD

## 2019-11-16 ENCOUNTER — Encounter: Payer: Self-pay | Admitting: Neurology

## 2019-11-21 ENCOUNTER — Other Ambulatory Visit (HOSPITAL_COMMUNITY): Payer: Self-pay | Admitting: Psychiatry

## 2019-11-22 ENCOUNTER — Ambulatory Visit (INDEPENDENT_AMBULATORY_CARE_PROVIDER_SITE_OTHER): Payer: BC Managed Care – PPO | Admitting: Gastroenterology

## 2019-11-22 ENCOUNTER — Encounter: Payer: Self-pay | Admitting: Gastroenterology

## 2019-11-22 VITALS — BP 110/86 | HR 80 | Ht 69.0 in | Wt 194.2 lb

## 2019-11-22 DIAGNOSIS — Z86010 Personal history of colonic polyps: Secondary | ICD-10-CM | POA: Diagnosis not present

## 2019-11-22 DIAGNOSIS — K602 Anal fissure, unspecified: Secondary | ICD-10-CM

## 2019-11-22 DIAGNOSIS — K6289 Other specified diseases of anus and rectum: Secondary | ICD-10-CM | POA: Diagnosis not present

## 2019-11-22 MED ORDER — BENEFIBER PO POWD: ORAL | 0 refills | Status: DC

## 2019-11-22 NOTE — Progress Notes (Signed)
HPI :  44 year old male with a history of colon polyps, history of cardiomyopathy, history of internal hemorrhoids, here for follow-up visit for rectal pain.  He states about 4 months ago he developed 2 weeks worth of stinging rectal pain that really bothered him.  He would have pain when passing stool and otherwise bothered him intermittently throughout the day.  He had some blood noting when wiping himself but none obvious in the toilet bowl.  He did have some straining and constipation around the onset of symptoms.  He states he was given a suppository by his primary care for hemorrhoidal related bleeding and started on a daily fiber supplement.  He did not think the suppository helped too much but his stools have been much softer and he has not had straining since then.  Eventually his symptoms resolved.  He has not really had any pain in the past few months.  He takes Benefiber with his coffee every day.  Denies any abdominal pains.  I thought he had previously endorsed a history of colon cancer in his father.  Turns out his father had prostate cancer not colon cancer.  He had a colonoscopy with me in February, he had 10 adenomas removed, largest 10 mm in size.  He underwent genetic testing which was negative for any pathologic mutations.  He is due for repeat colonoscopy in 3 years.  Biopsies were taken to rule out microscopic colitis are negative.  He had endorsed some loose stools at the time but states on the fiber has been doing much better.    Colonoscopy 03/13/2019 - The examined portion of the ileum was normal. - One 4 mm polyp in the cecum, removed with a cold snare. Resected and retrieved. - One 10 mm polyp versus benign mucosal change in the cecum, removed with a cold snare. Resected and retrieved. - Diverticulosis in the ascending colon. - Four 3 to 5 mm polyps in the ascending colon, removed with a cold snare. Resected and retrieved. - Four 3 to 10 mm polyps in the transverse  colon, removed with a cold snare. Resected and retrieved. - One 3 mm polyp in the descending colon, removed with a cold snare. Resected and retrieved. - Internal hemorrhoids. - The examination was otherwise normal. - Biopsies were taken with a cold forceps from the right colon, left colon and transverse colon for evaluation of microscopic colitis.  FINAL MICROSCOPIC DIAGNOSIS:   A. COLON, TRANSVERSE, CECUM, ASCENDING, DESCENDING, POLYPECTOMY:  - Tubular adenoma(s)  - Negative for high-grade dysplasia or malignancy   B. COLON, CECUM, POLYPECTOMY:  - Polypoid colonic mucosa with mild hyperplastic changes   C. COLON, RANDOM, BIOPSY:  - Colonic mucosa with no specific histopathologic changes  - Negative for acute inflammation, increased intraepithelial lymphocytes  or thickened subepithelial collagen table   Referred to genetic testing given burden of colon polyps. No pathologic mutations   Past Medical History:  Diagnosis Date  . Alcohol use disorder, severe, in early remission ([place]) 03/30/2018  . Allergy   . Anxiety   . Bipolar disorder, in partial remission, most recent episode hypomanic ([place]) 03/30/2018  . Bleeding internal hemorrhoids 09/12/2013  . Cannabis dependence ([place]) 07/13/2018   Daily use  . Cervical disc disease 11/20/2014  . Chronic anxiety 07/13/2018  . Complication of anesthesia   . Depression   . GERD (gastroesophageal reflux disease)   . Hernia, inguinal, bilateral 02/24/2011  . High ankle sprain of right lower extremity 01/16/2015  . History of  hiatal hernia   . Hx of hepatitis C 10/05/2017   -treated in 2019 -hepatology recommended no further surveillance needed except for LFTs with labs and see hepatologist if elevated  . Hypertension   . Lipids abnormal 09/29/2013  . Pneumonia   . PONV (postoperative nausea and vomiting)      Past Surgical History:  Procedure Laterality Date  . ANTERIOR CRUCIATE LIGAMENT REPAIR  2010  . BIOPSY  03/13/2019   Procedure:  BIOPSY;  Surgeon: Yetta Flock, MD;  Location: [facility];  Service: Gastroenterology;;  . COLONOSCOPY N/A 03/13/2019   Procedure: COLONOSCOPY;  Surgeon: Yetta Flock, MD;  Location: [facility];  Service: Gastroenterology;  Laterality: N/A;  . INGUINAL HERNIA REPAIR  02/23/2012   Procedure: LAPAROSCOPIC BILATERAL INGUINAL HERNIA REPAIR;  Surgeon: Shann Medal, MD;  Location: [facility];  Service: General;  Laterality: Bilateral;  Laparoscopic Bilateral Inguinal Hernia Repair with mesh  . INSERTION OF MESH  02/23/2012   Procedure: INSERTION OF MESH;  Surgeon: Shann Medal, MD;  Location: [facility];  Service: General;  Laterality: N/A;  . NOSE SURGERY  [id number]  . POLYPECTOMY  03/13/2019   Procedure: POLYPECTOMY;  Surgeon: Yetta Flock, MD;  Location: [facility];  Service: Gastroenterology;;   Family History  Problem Relation Age of Onset  . Hypertension Mother   . Atrial fibrillation Mother   . Hypertension Father   . Leukemia Father 21  . Prostate cancer Father 58  . Diabetes Father   . Diabetes Brother   . Other Brother        growth on finger  . Learning disabilities Paternal Aunt   . Lung cancer Maternal Grandmother   . Heart attack Maternal Grandfather   . Congestive Heart Failure Paternal Grandmother   . Colon cancer Neg Hx    Social History   Tobacco Use  . Smoking status: Former Research scientist (life sciences)  . Smokeless tobacco: Never Used  Vaping Use  . Vaping Use: Former  . Substances: CBD  Substance Use Topics  . Alcohol use: Yes    Alcohol/week: 4.0 standard drinks    Types: 4 Shots of liquor per week    Comment: (recovering alcoholic) current 4-5 drinks a week  . Drug use: Yes    Frequency: 2.0 times per week    Types: Marijuana   Current Outpatient Medications  Medication Sig Dispense Refill  . buPROPion (WELLBUTRIN XL) 300 MG 24 hr tablet Take 300 mg by mouth daily.    . hydrochlorothiazide (HYDRODIURIL) 25 MG tablet Take 1 tablet (25 mg total) by mouth  daily. 90 tablet 3  . lithium carbonate (LITHOBID) 300 MG CR tablet Take 2 tablets (600 mg total) by mouth at bedtime. 180 tablet 0  . losartan (COZAAR) 100 MG tablet TAKE 1 TABLET BY MOUTH EVERY DAY (Patient taking differently: Take 100 mg by mouth daily. ) 90 tablet 3  . lurasidone (LATUDA) 40 MG TABS tablet Take 1 tablet (40 mg total) by mouth daily at 6 PM. 56 tablet 0  . OLANZapine (ZYPREXA) 5 MG tablet Take 1 tablet (5 mg total) by mouth daily as needed (anxiety). 90 tablet 0  . Omeprazole 20 MG TBEC Take 20 mg by mouth daily.     . pravastatin (PRAVACHOL) 10 MG tablet TAKE 1 TABLET BY MOUTH EVERY DAY (Patient taking differently: Take 10 mg by mouth daily. ) 90 tablet 3  . propranolol ER (INDERAL LA) 80 MG 24 hr capsule Take 1 capsule (80 mg total)  by mouth daily. 30 capsule 2  . valACYclovir (VALTREX) 1000 MG tablet Take 1 tablet (1,000 mg total) by mouth 2 (two) times daily as needed (fever blisters). 60 tablet 5   No current facility-administered medications for this visit.   Allergies  Allergen Reactions  . Hydrocodone     UPSETS STOMACH     Review of Systems: All systems reviewed and negative except where noted in HPI.     Lab Results  Component Value Date   CREATININE 1.22 11/19/2018   BUN 26 (H) 11/19/2018   NA 139 11/19/2018   K 4.0 11/19/2018   CL 103 11/19/2018   CO2 24 11/19/2018    Lab Results  Component Value Date   ALT 25 11/18/2018   AST 24 11/18/2018   ALKPHOS 120 11/18/2018   BILITOT 2.6 (H) 11/18/2018     Physical Exam: BP 110/86 (BP Location: Left Arm, Patient Position: Sitting, Cuff Size: Normal)   Pulse 80   Ht 5\' 9"  (1.753 m) Comment: height measured without shoes  Wt 194 lb 4 oz (88.1 kg)   BMI 28.69 kg/m  Constitutional: Pleasant,well-developed, male in no acute distress. HEENT: Normocephalic and atraumatic. Conjunctivae are normal. No scleral icterus. Neck supple.  Cardiovascular: Normal rate, regular rhythm. . Abdominal: Soft,  nondistended, nontender.  There are no masses palpable.  DRE - anal fissure in 7 o'clock position posterior midline - appears healing, no mass lesion, anoscopy deferred in light of fissure. CMA Jan hogan as standby Extremities: no edema Lymphadenopathy: No cervical adenopathy noted. Neurological: Alert and oriented to person place and time. Skin: Skin is warm and dry. No rashes noted. Psychiatric: Normal mood and affect. Behavior is normal.   ASSESSMENT AND PLAN: 44 year old male here for reassessment of the following:  Rectal pain / anal fissure - had discomfort a few months ago in the setting of passing hard stool.  Started on fiber and over time has had improvement of his symptoms and pain really not bothering him anymore.  On DRE today he does have a healing anal fissure in the posterior midline anal canal which I suspect probably caused his symptoms previously.  He does have a history of internal hemorrhoids noted on prior colonoscopy as well, although with clear fissure on exam today I think that is more than likely the cause.  It has been healing with fiber supplementation and avoidance of straining.  We discussed other ways to heal this which would include topical nitroglycerin ointment.  I offered this to him however he wants to hold off for now given his symptoms have resolved.  If he does have recurrent pain or problems with this we will call in prescription for topical nitroglycerin I explained to him how to take this if he needs it.  Otherwise hopefully with continued fiber intake and avoidance of straining hopefully this will heal on its own regardless.  He agreed with the plan, he will contact me with any recurrence of symptoms moving forward.  History of colon polyps -10 adenomas on his last colonoscopy, largest 10 mm in size.  Refer to genetic counseling which was negative for pathologic mutations.  We discussed his history of polyps.  We clarified his family history, he does not have  a family history of colon cancer, only prostate cancer.  Per national guidelines he is due for a follow-up surveillance colonoscopy in 3 years from his last exam, so February 2024.  He was in agreement  Walker Cellar, MD [facility]

## 2019-11-22 NOTE — Patient Instructions (Addendum)
If you are age 44 or older, your body mass index should be between 23-30. Your Body mass index is 28.69 kg/m. If this is out of the aforementioned range listed, please consider follow up with your Primary Care Provider.  If you are age 63 or younger, your body mass index should be between 19-25. Your Body mass index is 28.69 kg/m. If this is out of the aformentioned range listed, please consider follow up with your Primary Care Provider.   Please purchase the following medications over the counter and take as directed: Benefiber: daily  You will be due for a recall colonoscopy in 03-2022. We will send you a reminder in the mail when it gets closer to that time.   Thank you for entrusting me with your care and for choosing [facility], Dr. Pringle Cellar

## 2019-11-22 NOTE — Progress Notes (Signed)
Assessment/Plan:   1.  Tremor  -This is likely due to the lithium.  Studies are varied, but incidate that up to 2/3 of patients on lithium will have lithium-induced tremor, even if the patients are not toxic on the medication.  This is just a common side effect of patients on lithium.  I did not recommend that he stop or taper the lithium, but rather talk to his prescribing physician about this.  I did tell him that in my experience, lithium-induced tremor does not respond well to attempts at treating it with other medications.  He was a bit frustrated but understood and didn't want more med if he didn't need it.  In addition, Dr. Sarajane Jews put him on propranolol LA 80 mg 2 weeks ago.  -Patient is also on 2 different antipsychotics (Latuda and olanzapine), which both can contribute to tremor.  We discussed this today.    Subjective:   Thomas Williamson was seen today in the movement disorders clinic for neurologic consultation at the request of [name], MD.  The consultation is for the evaluation of tremor.  Medical records made available to me, including those from primary care as well as those from psychiatry.  Patient is a 44 year old male with a history of bipolar disorder and alcoholism (sober since October, 2020, but still drinking occasionally) who presents today for the evaluation of tremor.  Tremor: Yes.     How long has it been going on? 4 years but much worse over the last 3 months.  He first noted it with putting golf on a tee years ago but that wasn't bad.  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  activation  When is it noted the most?  With trimming hair around the ear  Fam hx of tremor?  No.  Located where?  Bilateral UE R hand dominant  Affected by caffeine: unknown - drinks 1 cup coffee per day  Affected by alcohol:  No. - drinks 2-3 times per week  Affected by stress:  Yes.    Affected by fatigue: unknown  Spills soup if on spoon:   No.  Spills glass of liquid if full:  No.  Affects ADL's (tying shoes, brushing teeth, etc):  No.  Tremor inducing meds:  Yes.  , lithium x about 3 years, latuda x less than 1 year, zyprexa x 2-3 years  meds for tremor:  Propranolol LA 80 mg started 11/15/19 - he thinks that helped compared to metoprolol    ALLERGIES:   Allergies  Allergen Reactions   Hydrocodone     UPSETS STOMACH    CURRENT MEDICATIONS:  Current Outpatient Medications  Medication Instructions   buPROPion (WELLBUTRIN XL) 300 mg, Oral, Daily   hydrochlorothiazide (HYDRODIURIL) 25 mg, Oral, Daily   lithium carbonate (LITHOBID) 600 mg, Oral, Nightly   losartan (COZAAR) 100 MG tablet TAKE 1 TABLET BY MOUTH EVERY DAY   lurasidone (LATUDA) 40 mg, Oral, Daily-1800   OLANZapine (ZYPREXA) 5 mg, Oral, Daily PRN   Omeprazole 20 mg, Oral, Daily   pravastatin (PRAVACHOL) 10 MG tablet TAKE 1 TABLET BY MOUTH EVERY DAY   propranolol ER (INDERAL LA) 80 mg, Oral, Daily   valACYclovir (VALTREX) 1,000 mg, Oral, 2 times daily PRN   Wheat Dextrin (BENEFIBER) POWD Use as directed, daily    Objective:   PHYSICAL EXAMINATION:    VITALS:   Vitals:   11/27/19 0912  BP: 120/83  Pulse: 72  SpO2: 99%  Weight: 193 lb (87.5 kg)  Height: 5\' 9"  (1.753 m)    GEN:  The patient appears stated age and is in NAD. HEENT:  Normocephalic, atraumatic.  The mucous membranes are moist. The superficial temporal arteries are without ropiness or tenderness. CV:  RRR Lungs:  CTAB Neck/HEME:  There are no carotid bruits bilaterally.  Neurological examination:  Orientation: The patient is alert and oriented x3.  Cranial nerves: There is good facial symmetry.  Extraocular muscles are intact. The visual fields are full to confrontational testing. The speech is fluent and clear. Soft palate rises symmetrically and there is no tongue deviation. Hearing is intact to conversational tone. Sensation: Sensation is intact to light touch  throughout (facial, trunk, extremities). Vibration is intact at the bilateral big toe. There is no extinction with double simultaneous stimulation.  Motor: Strength is 5/5 in the bilateral upper and lower extremities.   Shoulder shrug is equal and symmetric.  There is no pronator drift. Deep tendon reflexes: Deep tendon reflexes are 2+-3-/4 at the bilateral biceps, triceps, brachioradialis, patella and achilles. Plantar responses are downgoing bilaterally.  Movement examination: Tone: There is nl tone in the bilateral upper extremities.  The tone in the lower extremities is nl.  Abnormal movements: no rest tremor.  Min postural tremor.  Mild intention tremor.  No asterixis.  Mild trouble with Archimedes spirals bilaterally.  Tremor is noted when he pours water from one glass to another, but he does not spill any of the water. Coordination:  There is no decremation with RAM's, Gait and Station: The patient has no difficulty arising out of a deep-seated chair without the use of the hands. The patient's stride length is good.   I have reviewed and interpreted the following labs independently   Chemistry      Component Value Date/Time   NA 139 11/19/2018 0425   K 4.0 11/19/2018 1401   CL 103 11/19/2018 0425   CO2 24 11/19/2018 0425   BUN 26 (H) 11/19/2018 0425   CREATININE 1.22 11/19/2018 0425      Component Value Date/Time   CALCIUM 9.2 11/19/2018 0425   ALKPHOS 120 11/18/2018 0951   AST 24 11/18/2018 0951   ALT 25 11/18/2018 0951   BILITOT 2.6 (H) 11/18/2018 0951      Lab Results  Component Value Date   TSH 0.74 03/31/2016   Lab Results  Component Value Date   WBC 9.5 11/19/2018   HGB 10.7 (L) 11/19/2018   HCT 31.4 (L) 11/19/2018   MCV 92.1 11/19/2018   PLT 243 11/19/2018   Lab Results  Component Value Date   LITHIUM 0.3 (L) 03/08/2019   NA 139 11/19/2018   BUN 26 (H) 11/19/2018   CREATININE 1.22 11/19/2018   TSH 0.74 03/31/2016   WBC 9.5 11/19/2018       Cc:  [name], MD

## 2019-11-27 ENCOUNTER — Ambulatory Visit: Payer: BC Managed Care – PPO | Admitting: Neurology

## 2019-11-27 ENCOUNTER — Encounter: Payer: Self-pay | Admitting: Neurology

## 2019-11-27 ENCOUNTER — Telehealth (HOSPITAL_COMMUNITY): Payer: Self-pay | Admitting: *Deleted

## 2019-11-27 ENCOUNTER — Other Ambulatory Visit: Payer: Self-pay

## 2019-11-27 VITALS — BP 120/83 | HR 72 | Ht 69.0 in | Wt 193.0 lb

## 2019-11-27 DIAGNOSIS — G251 Drug-induced tremor: Secondary | ICD-10-CM | POA: Diagnosis not present

## 2019-11-27 NOTE — Telephone Encounter (Signed)
This patient called and said he had been to an Neurologist and that he had tremors form Lithium.  He would like to know if you will change medication to another one.  Please review and advise.

## 2019-11-27 NOTE — Telephone Encounter (Signed)
His lithium level has been subtherapeutic and he is also on [place]. I think he should just stop taking lithium and we will see if an addition of another mood stabilizer to [place] is necessary.

## 2019-11-27 NOTE — Patient Instructions (Signed)
Talk to your psychiatrist about the lithium.  I think that is causing/promoting the tremor.  Both the latuda and the olanzapine can also cause tremor but I believe that this is most likely the lithium.  Do not stop or change any of these medications without talking with your psychiatrist.  The physicians and staff at [facility] are committed to providing excellent care. You may receive a survey requesting feedback about your experience at our office. We strive to receive "very good" responses to the survey questions. If you feel that your experience would prevent you from giving the office a "very good " response, please contact our office to try to remedy the situation. We may be reached at [phone]. Thank you for taking the time out of your busy day to complete the survey.

## 2019-11-27 NOTE — Telephone Encounter (Signed)
I have contacted patient. He was happy yo stop it.  He will call if he has problems without it.

## 2019-11-29 ENCOUNTER — Other Ambulatory Visit: Payer: Self-pay | Admitting: Family Medicine

## 2019-11-30 MED ORDER — PRAVASTATIN SODIUM 10 MG PO TABS
10.0000 mg | ORAL_TABLET | Freq: Every day | ORAL | 0 refills | Status: DC
Start: 2019-11-30 — End: 2019-12-11

## 2019-11-30 MED ORDER — LOSARTAN POTASSIUM 100 MG PO TABS
100.0000 mg | ORAL_TABLET | Freq: Every day | ORAL | 0 refills | Status: DC
Start: 2019-11-30 — End: 2019-12-11

## 2019-11-30 NOTE — Telephone Encounter (Signed)
This has been done.

## 2019-11-30 NOTE — Telephone Encounter (Signed)
Patient needs his medication sent to  [id number] - [place], [place] Phone:  [phone]  Fax:  [phone]     losartan (COZAAR) 100 MG tablet   pravastatin (PRAVACHOL) 10 MG tablet

## 2019-11-30 NOTE — Addendum Note (Signed)
Addended by: Matilde Sprang on: 11/30/2019 04:15 PM   Modules accepted: Orders

## 2019-11-30 NOTE — Telephone Encounter (Signed)
Patient called and advised he will need an appointment,, he verbalized understanding. Physical scheduled for Monday, 12/11/19 at 1000, advised to be fasting for labs. 30 day supply sent as courtesy refill.

## 2019-12-04 ENCOUNTER — Telehealth (INDEPENDENT_AMBULATORY_CARE_PROVIDER_SITE_OTHER): Payer: BC Managed Care – PPO | Admitting: Psychiatry

## 2019-12-04 ENCOUNTER — Other Ambulatory Visit (HOSPITAL_COMMUNITY): Payer: Self-pay | Admitting: Psychiatry

## 2019-12-04 ENCOUNTER — Other Ambulatory Visit: Payer: Self-pay

## 2019-12-04 DIAGNOSIS — F1021 Alcohol dependence, in remission: Secondary | ICD-10-CM | POA: Diagnosis not present

## 2019-12-04 DIAGNOSIS — F3176 Bipolar disorder, in full remission, most recent episode depressed: Secondary | ICD-10-CM

## 2019-12-04 MED ORDER — OLANZAPINE 5 MG PO TABS
5.0000 mg | ORAL_TABLET | Freq: Every day | ORAL | 0 refills | Status: DC | PRN
Start: 2019-12-04 — End: 2020-02-05

## 2019-12-04 MED ORDER — BUPROPION HCL ER (XL) 300 MG PO TB24: 300.0000 mg | ORAL_TABLET | Freq: Every day | ORAL | 2 refills | Status: DC

## 2019-12-04 NOTE — Progress Notes (Addendum)
BH MD/PA/NP OP Progress Note  12/04/2019 9:47 AM Thomas Williamson  MRN:  [id number] Interview was conducted using videoconferencing application and I verified that I was speaking with the correct person using two identifiers. I discussed the limitations of evaluation and management by telemedicine and  the availability of in person appointments. Patient expressed understanding and agreed to proceed. Patient location - home; physician - home office.  Chief Complaint: Tremor.  HPI: 44yo male withBPAD and alcohol usedisorder severe and bipolar disorder. 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 He was briefly on oral naltrexone afterwords.He lives with his parents and drinks only occasionally, not losing control. [place]ium [place] 300 mg [place] now takes a whole dose at bedtime.Level is low - 0.3 mmol/l.But mood has been stable. When we tried to increased dose to 900 mg he reported feeling nauseated and decreased dose back to previous one.He has complained of loss of libido and we decreased dose of Lexapro and eventually discontinued it. Wellbutrin was increased instead.He takesolanzapine 5 mg prn anxietyand we added Latuda for depression. 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 He reports long standing resting tremor which interferes with his school/work and has seen neurologist lately for that. It is likely that tremor is related to lithium despite level being low. We  decide to stop it and continue olanzapine 5 mg in am (takes it daily) and lurasidone 40 mg in PM.    Visit Diagnosis:    ICD-10-CM   1. Alcohol use disorder, severe, in early remission ([place])  F10.21   2. Bipolar 1 disorder, depressed, full remission ([place])  F31.76     Past Psychiatric History: Please see intake H&P.  Past Medical History:  Past Medical History:  Diagnosis Date  . Alcohol use disorder, severe, in early remission ([place]) 03/30/2018  . Allergy   . Anxiety   . Bipolar disorder, in partial remission, most recent episode hypomanic ([place]) 03/30/2018  . Bleeding internal hemorrhoids 09/12/2013  . Cannabis dependence ([place]) 07/13/2018   Daily use  . Cervical disc disease 11/20/2014  . Chronic anxiety 07/13/2018  . Complication of anesthesia   . Depression   . GERD (gastroesophageal reflux disease)   . Hernia, inguinal, bilateral 02/24/2011  . High ankle sprain of right lower extremity 01/16/2015  . History of hiatal hernia   . Hx of hepatitis C 10/05/2017   -treated in 2019 -hepatology recommended no further surveillance needed except for LFTs with labs and see hepatologist if elevated  . Hypertension   . Lipids abnormal 09/29/2013  . Pneumonia   . PONV (postoperative nausea and vomiting)     Past Surgical History:  Procedure Laterality Date  . ANTERIOR CRUCIATE LIGAMENT REPAIR  2010  . BIOPSY  03/13/2019   Procedure: BIOPSY;  Surgeon: Yetta Flock, MD;  Location: [facility];  Service: Gastroenterology;;  . COLONOSCOPY N/A 03/13/2019   Procedure: COLONOSCOPY;  Surgeon: Yetta Flock, MD;  Location: [facility];  Service: Gastroenterology;  Laterality: N/A;  . INGUINAL HERNIA REPAIR  02/23/2012  Procedure: LAPAROSCOPIC BILATERAL INGUINAL HERNIA REPAIR;  Surgeon: Shann Medal, MD;  Location: [facility];  Service: General;  Laterality: Bilateral;  Laparoscopic Bilateral Inguinal Hernia Repair with mesh  . INSERTION OF MESH  02/23/2012   Procedure: INSERTION OF MESH;   Surgeon: Shann Medal, MD;  Location: [facility];  Service: General;  Laterality: N/A;  . NOSE SURGERY  [id number]  . POLYPECTOMY  03/13/2019   Procedure: POLYPECTOMY;  Surgeon: Yetta Flock, MD;  Location: [facility];  Service: Gastroenterology;;    Family Psychiatric History: None/  Family History:  Family History  Problem Relation Age of Onset  . Hypertension Mother   . Atrial fibrillation Mother   . Hypertension Father   . Leukemia Father 47  . Prostate cancer Father 5  . Diabetes Father   . Diabetes Brother   . Other Brother        growth on finger  . Learning disabilities Paternal Aunt   . Lung cancer Maternal Grandmother   . Heart attack Maternal Grandfather   . Congestive Heart Failure Paternal Grandmother   . Colon cancer Neg Hx     Social History:  Social History   Socioeconomic History  . Marital status: Single    Spouse name: Not on file  . Number of children: 0  . Years of education: Not on file  . Highest education level: Bachelor's degree (e.g., BA, AB, BS)  Occupational History  . Not on file  Tobacco Use  . Smoking status: Former Research scientist (life sciences)  . Smokeless tobacco: Never Used  Vaping Use  . Vaping Use: Former  . Substances: CBD  Substance and Sexual Activity  . Alcohol use: Yes    Alcohol/week: 4.0 standard drinks    Types: 4 Shots of liquor per week    Comment: (recovering alcoholic) occassioanl drink  . Drug use: Yes    Frequency: 2.0 times per week    Types: Marijuana  . Sexual activity: Yes    Birth control/protection: Condom  Other Topics Concern  . 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 Difficulty of Paying Living Expenses: Not on file  Food Insecurity:   . Worried About  Charity fundraiser in the Last Year: Not on file  . Ran Out of Food in the Last Year: Not on file  Transportation Needs:   . Lack of Transportation (Medical): Not on file  . Lack of Transportation (Non-Medical): Not on file  Physical Activity:   . Days of Exercise per Week: Not on file  . Minutes of Exercise per Session: Not on file  Stress:   . Feeling of Stress : Not on file  Social Connections:   . Frequency of Communication with Friends and Family: Not on file  . Frequency of Social Gatherings with Friends and Family: Not on file  . Attends Religious Services: Not on file  . Active Member of Clubs or Organizations: Not on file  . Attends Archivist Meetings: Not on file  . Marital Status: Not on file    Allergies:  Allergies  Allergen Reactions  . Hydrocodone  UPSETS STOMACH    Metabolic Disorder Labs: No results found for: HGBA1C, MPG No results found for: PROLACTIN Lab Results  Component Value Date   CHOL 241 (H) 09/19/2018   TRIG 316.0 (H) 09/19/2018   HDL 47.00 09/19/2018   CHOLHDL 5 09/19/2018   VLDL 63.2 (H) 09/19/2018   Lab Results  Component Value Date   TSH 0.74 03/31/2016   TSH 0.48 09/20/2013    Therapeutic Level Labs: Lab Results  Component Value Date   LITHIUM 0.3 (L) 03/08/2019   LITHIUM 0.6 12/18/2016   No results found for: VALPROATE No components found for:  CBMZ  Current Medications: Current Outpatient Medications  Medication Sig Dispense Refill  . buPROPion (WELLBUTRIN XL) 300 MG 24 hr tablet Take 300 mg by mouth daily.    . hydrochlorothiazide (HYDRODIURIL) 25 MG tablet Take 1 tablet (25 mg total) by mouth daily. 90 tablet 3  . losartan (COZAAR) 100 MG tablet Take 1 tablet (100 mg total) by mouth daily. 30 tablet 0  . lurasidone (LATUDA) 40 MG TABS tablet Take 1 tablet (40 mg total) by mouth daily at 6 PM. 56 tablet 0  . OLANZapine (ZYPREXA) 5 MG tablet Take 1 tablet (5 mg total) by mouth daily as needed (anxiety). 90  tablet 0  . Omeprazole 20 MG TBEC Take 20 mg by mouth daily.     . pravastatin (PRAVACHOL) 10 MG tablet Take 1 tablet (10 mg total) by mouth daily. 30 tablet 0  . propranolol ER (INDERAL LA) 80 MG 24 hr capsule Take 1 capsule (80 mg total) by mouth daily. 30 capsule 2  . valACYclovir (VALTREX) 1000 MG tablet Take 1 tablet (1,000 mg total) by mouth 2 (two) times daily as needed (fever blisters). 60 tablet 5  . Wheat Dextrin (BENEFIBER) POWD Use as directed, daily  0   No current facility-administered medications for this visit.      Psychiatric Specialty Exam: Review of Systems  There were no vitals taken for this visit.There is no height or weight on file to calculate BMI.  General Appearance: Casual and Fairly Groomed  Eye Contact:  Good  Speech:  Clear and Coherent  Volume:  Normal  Mood:  Euthymic  Affect:  Full Range  Thought Process:  Goal Directed  Orientation:  Full (Time, Place, and Person)  Thought Content: Logical   Suicidal Thoughts:  No  Homicidal Thoughts:  No  Memory:  Immediate;   Good Recent;   Good Remote;   Good  Judgement:  Good  Insight:  Good  Psychomotor Activity:  Normal  Concentration:  Concentration: Good  Recall:  Good  Fund of Knowledge: Good  Language: Good  Akathisia:  Negative  Handed:  Right  AIMS (if indicated): not done  Assets:  Communication Skills Desire for Improvement Financial Resources/Insurance Housing Social Support Talents/Skills  ADL's:  Intact  Cognition: WNL  Sleep:  Good   Screenings: AUDIT     Counselor from 07/11/2018 in [place]  Alcohol Use Disorder Identification Test Final Score (AUDIT) 30    GAD-7     Counselor from 07/11/2018 in [place]  Total GAD-7 Score 9    PHQ2-9     Counselor from 07/11/2018 in [place]  PHQ-2 Total Score 2  PHQ-9 Total Score 8       Assessment and Plan: 44yo male  withBPAD and alcohol usedisorder severe and bipolar disorder. He was twice in inpatient rehab (  [place] and in [name]).He relapsed in October 2020 and went through acute withdrawal.[facility].Hestarted IM Vivitrol 380 mg on October 20th (sober since 11/18/18)but decided not to continue after few months. He was briefly on oral naltrexone afterwords.He lives with his parents and drinks only occasionally, not losing control. Lithium dose was increased to 300 mg bid [place] in [place] now takes a whole dose at bedtime.Level is low - 0.3 mmol/l.But mood has been stable. When we tried to increased dose to 900 mg he reported feeling nauseated and decreased dose back to previous one.He has complained of loss of libido and we decreased dose of Lexapro and eventually discontinued it. Wellbutrin was increased instead.He takesolanzapine 5 mg prn anxietyand we added Latuda for depression. 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 He reports long standing resting tremor which interferes with his school/work and has seen neurologist lately for that. It is likely that tremor is related to lithium despite level being low. We decide to stop it and continue olanzapine 5 mg in am (takes it daily) and lurasidone 40 mg in PM.   Dx: Alcohol use disorder severein partial remission; BPAD, depressedin remission  Plan:ContinueWellbutrin XL to 300 mg,olanzapine5 mg prn anxiety and Latuda 40 mg at [facility](gets samples of the latter).Return to clinic in twomonths.The plan was discussed with patient who had an opportunity to ask questions and these were all answered. I spend52minutes invideo visitwith the patient.    Stephanie Acre, MD 12/04/2019, 9:47 AM

## 2019-12-11 ENCOUNTER — Ambulatory Visit (INDEPENDENT_AMBULATORY_CARE_PROVIDER_SITE_OTHER): Payer: BC Managed Care – PPO | Admitting: Family Medicine

## 2019-12-11 ENCOUNTER — Other Ambulatory Visit: Payer: Self-pay

## 2019-12-11 ENCOUNTER — Encounter: Payer: Self-pay | Admitting: Family Medicine

## 2019-12-11 VITALS — BP 124/84 | HR 74 | Temp 97.8°F | Ht 71.0 in | Wt 194.2 lb

## 2019-12-11 DIAGNOSIS — Z23 Encounter for immunization: Secondary | ICD-10-CM | POA: Diagnosis not present

## 2019-12-11 DIAGNOSIS — Z0000 Encounter for general adult medical examination without abnormal findings: Secondary | ICD-10-CM

## 2019-12-11 DIAGNOSIS — N5314 Retrograde ejaculation: Secondary | ICD-10-CM | POA: Diagnosis not present

## 2019-12-11 MED ORDER — LOSARTAN POTASSIUM 100 MG PO TABS
100.0000 mg | ORAL_TABLET | Freq: Every day | ORAL | 3 refills | Status: DC
Start: 2019-12-11 — End: 2020-04-10

## 2019-12-11 MED ORDER — PRAVASTATIN SODIUM 10 MG PO TABS
10.0000 mg | ORAL_TABLET | Freq: Every day | ORAL | 3 refills | Status: DC
Start: 2019-12-11 — End: 2021-01-20

## 2019-12-11 NOTE — Progress Notes (Signed)
Subjective:    Patient ID: Thomas Williamson, male    DOB: 06/22/1975, 44 y.o.   MRN: [id number]  HPI Here for a well exam. His only complain today is that for the past 3 years when he ejaculates during sex, no fluid comes out. This is not painful but sometimes he finds it embarrassing. He saw Dr. Carles Collet last month about his tremors, and she felt they were a side effect of the Lithium. He then saw Dr. Montel Culver of [facility], and he stopped the Lithium. Now Thomas Williamson thinks the tremors are a little better, but he knows it will take more time for this to resolve. He walks 2 miles about 2-3 days a week. 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    Review of Systems  Constitutional: Negative.   HENT: Negative.   Eyes: Negative.   Respiratory: Negative.   Cardiovascular: Negative.   Gastrointestinal: Negative.   Genitourinary: Negative.   Musculoskeletal: Negative.   Skin: Negative.   Neurological: Positive for tremors.  Psychiatric/Behavioral: Positive for dysphoric mood. The patient is nervous/anxious.        Objective:   Physical Exam Constitutional:      General: He is not in acute distress.    Appearance: He is well-developed. He is not diaphoretic.  HENT:     Head: Normocephalic and atraumatic.     Right Ear: External ear normal.     Left Ear: External ear normal.     Nose: Nose normal.     Mouth/Throat:     Pharynx: No oropharyngeal exudate.  Eyes:     General: No scleral icterus.       Right eye: No discharge.        Left eye: No discharge.     Conjunctiva/sclera: Conjunctivae normal.     Pupils: Pupils are equal, round, and reactive to light.  Neck:     Thyroid: No thyromegaly.     Vascular: No JVD.     Trachea: No tracheal deviation.  Cardiovascular:     Rate and Rhythm: Normal rate and regular rhythm.     Heart sounds: Normal heart sounds. No murmur heard.  No friction rub. No gallop.   Pulmonary:      Effort: Pulmonary effort is normal. No respiratory distress.     Breath sounds: Normal breath sounds. No wheezing or rales.  Chest:     Chest wall: No tenderness.  Abdominal:     General: Bowel sounds are normal. There is no distension.     Palpations: Abdomen is soft. There is no mass.     Tenderness: There is no abdominal tenderness. There is no guarding or rebound.  Genitourinary:    Penis: Normal. No tenderness.      Testes: Normal.  Musculoskeletal:        General: No tenderness. Normal range of motion.     Cervical back: Neck supple.  Lymphadenopathy:     Cervical: No cervical adenopathy.  Skin:    General: Skin is warm and dry.     Coloration: Skin is not pale.     Findings: No erythema or rash.  Neurological:     Mental Status: He is alert and oriented to person, place, and time.     Cranial Nerves: No cranial nerve deficit.     Motor: No abnormal muscle tone.     Coordination: Coordination normal.     Deep Tendon Reflexes:  Reflexes are normal and symmetric. Reflexes normal.  Psychiatric:        Behavior: Behavior normal.        Thought Content: Thought content normal.        Judgment: Judgment normal.           Assessment & Plan:  Well exam. We discussed diet and exercise. Get fasting labs. It sounds like he is experiencing retrograde ejaculations, and we will refer him to Urology to evaluate this. Thomas Penna, MD

## 2019-12-12 LAB — BASIC METABOLIC PANEL WITH GFR
BUN: 20 mg/dL (ref 7–25)
CO2: 24 mmol/L (ref 20–32)
Calcium: 10.3 mg/dL (ref 8.6–10.3)
Chloride: 102 mmol/L (ref 98–110)
Creat: 1.06 mg/dL (ref 0.60–1.35)
GFR, Est African American: 98 mL/min/{1.73_m2} (ref >=60)
GFR, Est Non African American: 85 mL/min/{1.73_m2} (ref >=60)
Glucose, Bld: 93 mg/dL (ref 65–99)
Potassium: 4.1 mmol/L (ref 3.5–5.3)
Sodium: 140 mmol/L (ref 135–146)

## 2019-12-12 LAB — CBC WITH DIFFERENTIAL/PLATELET
Absolute Monocytes: 548 cells/uL (ref 200–950)
Basophils Absolute: 52 cells/uL (ref 0–200)
Basophils Relative: 0.7 %
Eosinophils Absolute: 104 cells/uL (ref 15–500)
Eosinophils Relative: 1.4 %
HCT: 41.3 % (ref 38.5–50.0)
Hemoglobin: 14 g/dL (ref 13.2–17.1)
Lymphs Abs: 2812 cells/uL (ref 850–3900)
MCH: 32 pg (ref 27.0–33.0)
MCHC: 33.9 g/dL (ref 32.0–36.0)
MCV: 94.5 fL (ref 80.0–100.0)
MPV: 10.4 fL (ref 7.5–12.5)
Monocytes Relative: 7.4 %
Neutro Abs: 3885 cells/uL (ref 1500–7800)
Neutrophils Relative %: 52.5 %
Platelets: 312 10*3/uL (ref 140–400)
RBC: 4.37 10*6/uL (ref 4.20–5.80)
RDW: 11.9 % (ref 11.0–15.0)
Total Lymphocyte: 38 %
WBC: 7.4 10*3/uL (ref 3.8–10.8)

## 2019-12-12 LAB — HEPATIC FUNCTION PANEL
AG Ratio: 1.9 (calc) (ref 1.0–2.5)
ALT: 30 U/L (ref 9–46)
AST: 17 U/L (ref 10–40)
Albumin: 5 g/dL (ref 3.6–5.1)
Alkaline phosphatase (APISO): 85 U/L (ref 36–130)
Bilirubin, Direct: 0.1 mg/dL (ref 0.0–0.2)
Globulin: 2.7 g/dL (calc) (ref 1.9–3.7)
Indirect Bilirubin: 0.4 mg/dL (calc) (ref 0.2–1.2)
Total Bilirubin: 0.5 mg/dL (ref 0.2–1.2)
Total Protein: 7.7 g/dL (ref 6.1–8.1)

## 2019-12-12 LAB — LIPID PANEL
Cholesterol: 241 mg/dL — ABNORMAL HIGH (ref <200)
HDL: 43 mg/dL (ref >=40)
LDL Cholesterol (Calc): 139 mg/dL (calc) — ABNORMAL HIGH
Non-HDL Cholesterol (Calc): 198 mg/dL (calc) — ABNORMAL HIGH (ref <130)
Total CHOL/HDL Ratio: 5.6 (calc) — ABNORMAL HIGH (ref <5.0)
Triglycerides: 386 mg/dL — ABNORMAL HIGH (ref <150)

## 2019-12-12 LAB — TSH: TSH: 0.54 mIU/L (ref 0.40–4.50)

## 2019-12-24 IMAGING — DX DG CHEST 1V
1 series · 1 of 1 positions shown · non-contrast
Comparison: 02/19/2012

CLINICAL DATA: Vomiting

EXAM:
CHEST  1 VIEW

[chest ap]
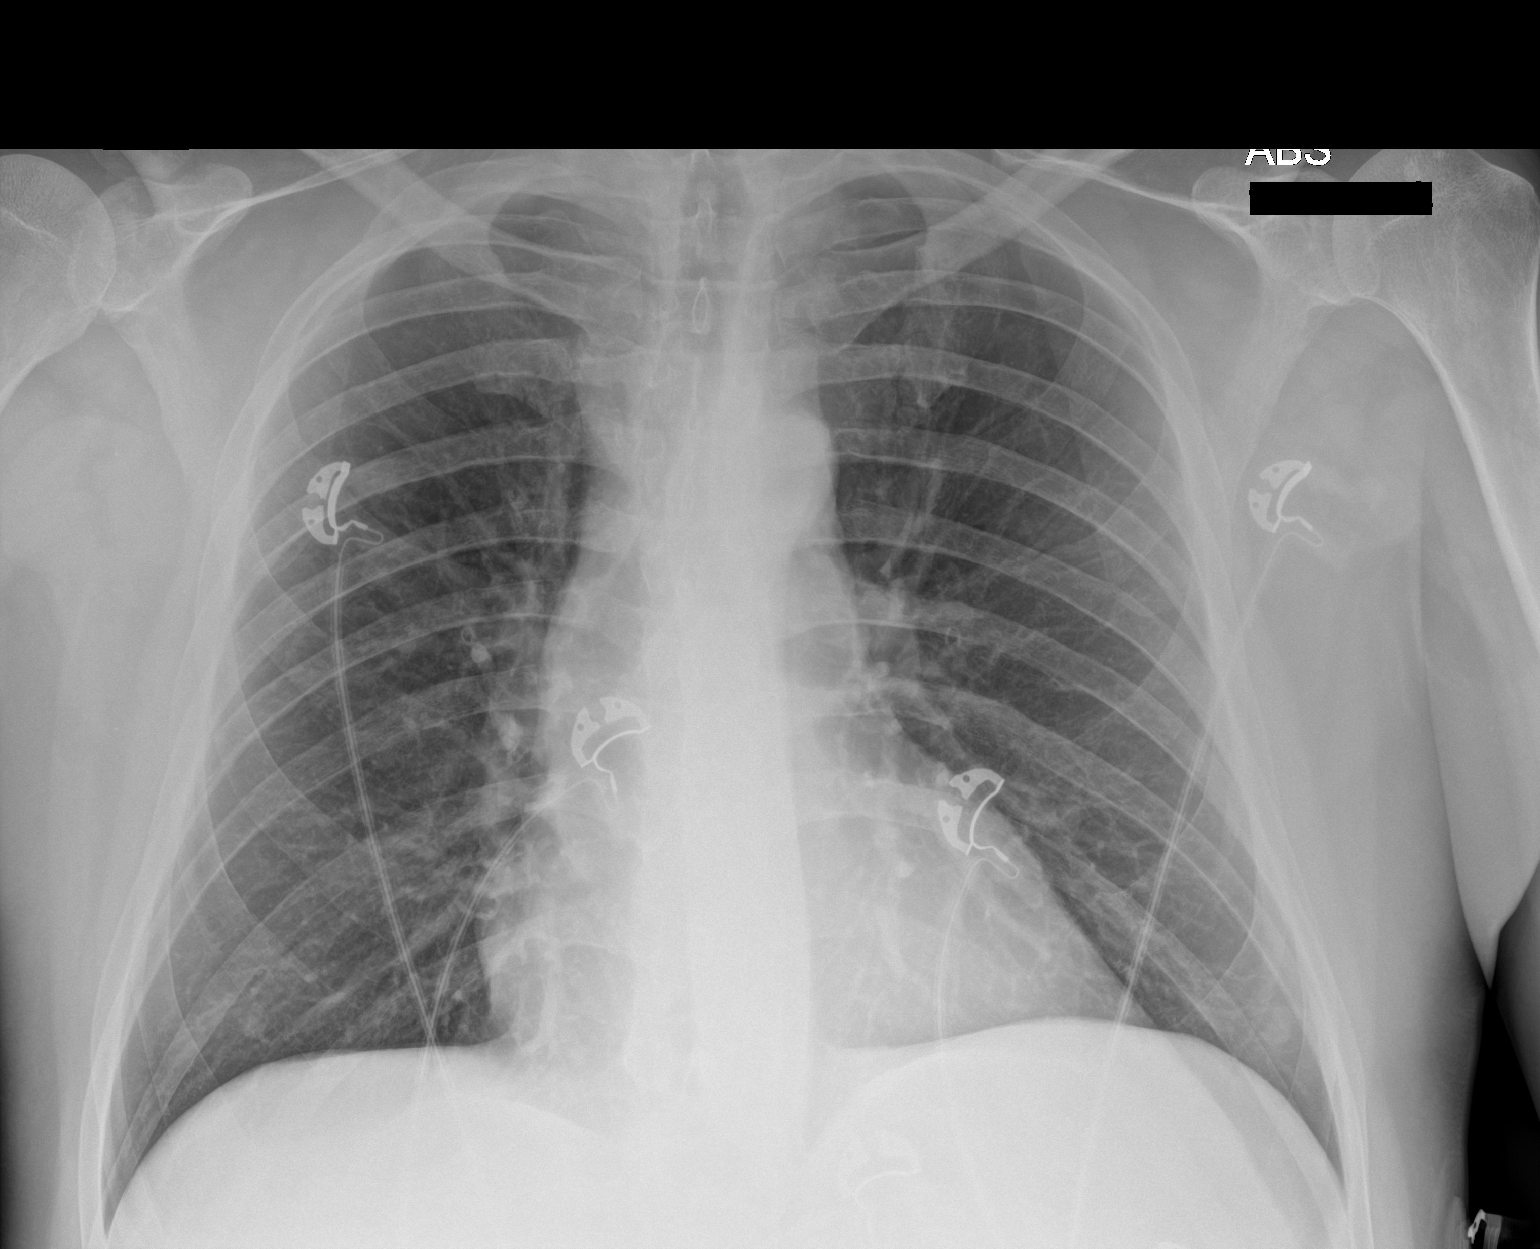

[1 of 1 positions shown; findings below may reference images not displayed]

FINDINGS: Heart and mediastinal contours are within normal limits. No focal
opacities or effusions. No acute bony abnormality.
IMPRESSION: No active disease.

## 2019-12-24 IMAGING — US US RENAL
1 series · 14 of 25 positions shown · non-contrast
Comparison: Ultrasound 12/18/2016.

CLINICAL DATA: Acute renal injury.

EXAM:
RENAL / URINARY TRACT ULTRASOUND COMPLETE

[Series 1: us renal · 14 of 35 slices shown]
[im 1/35]
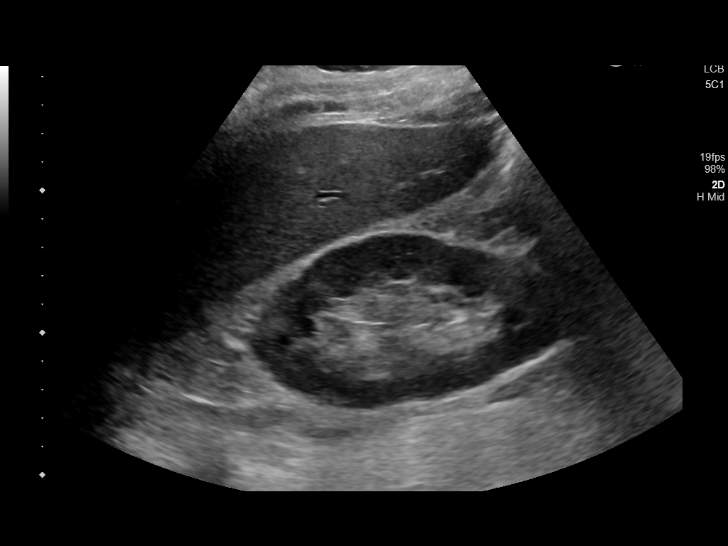
[im 3/35]
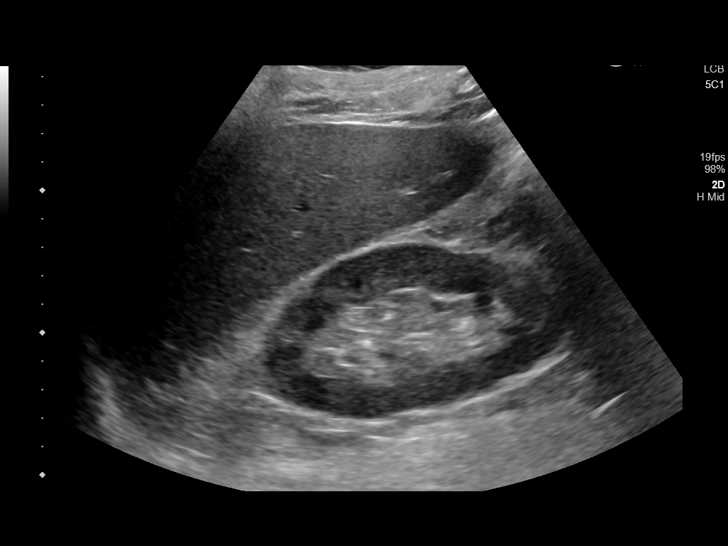
[im 6/35]
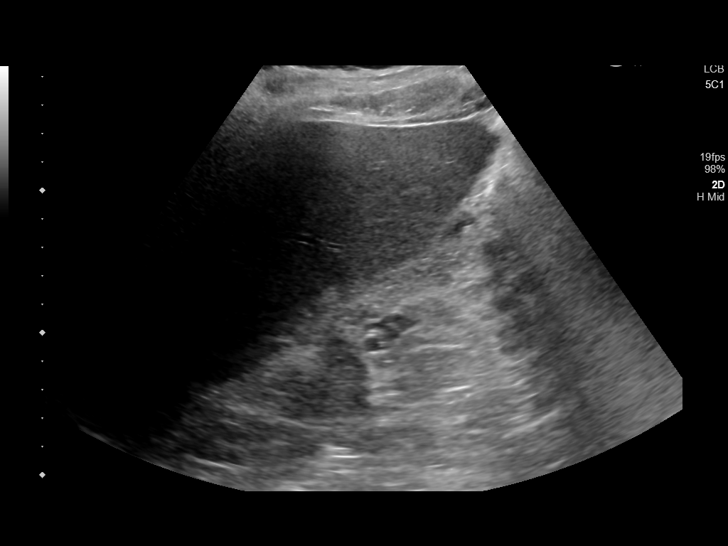
[im 9/35]
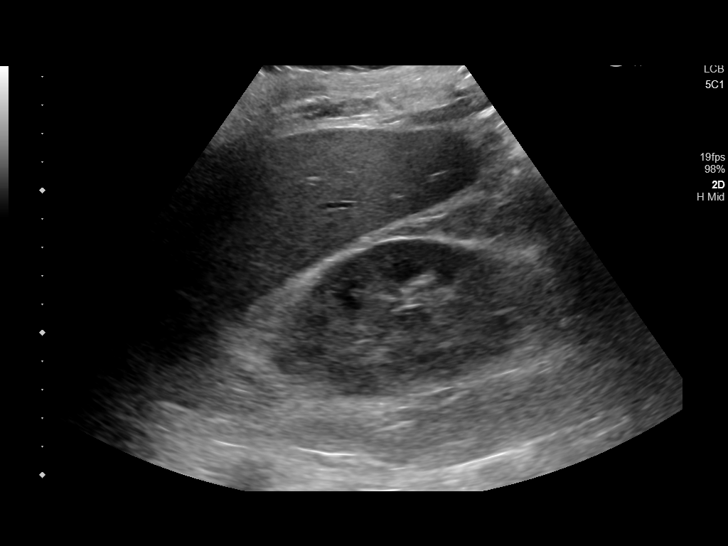
[im 12/35]
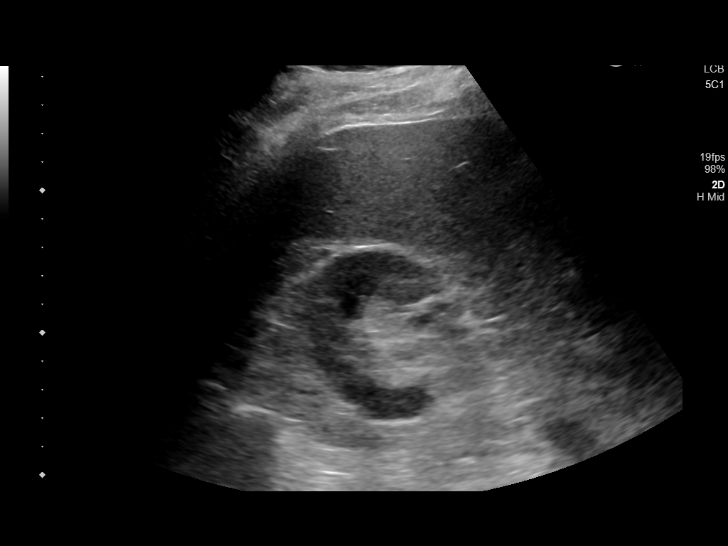
[im 13/35]
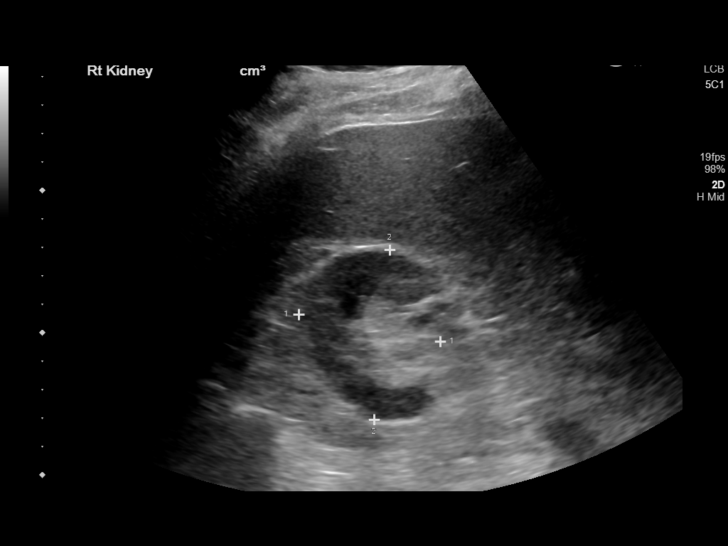
[im 16/35]
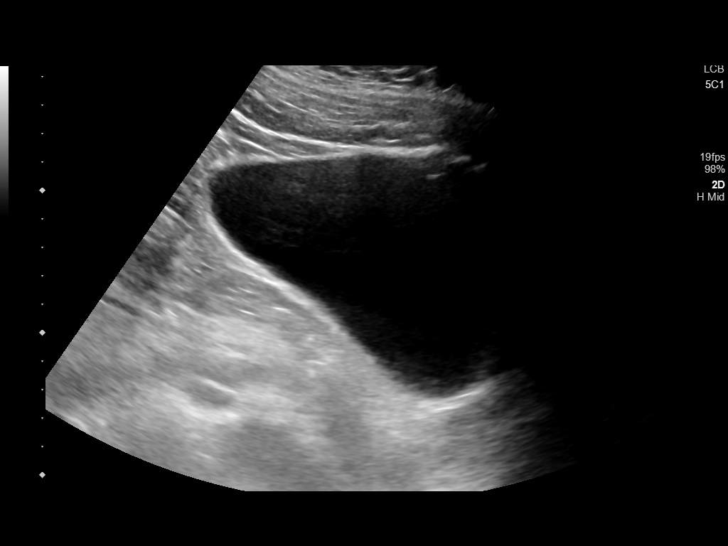
[im 19/35]
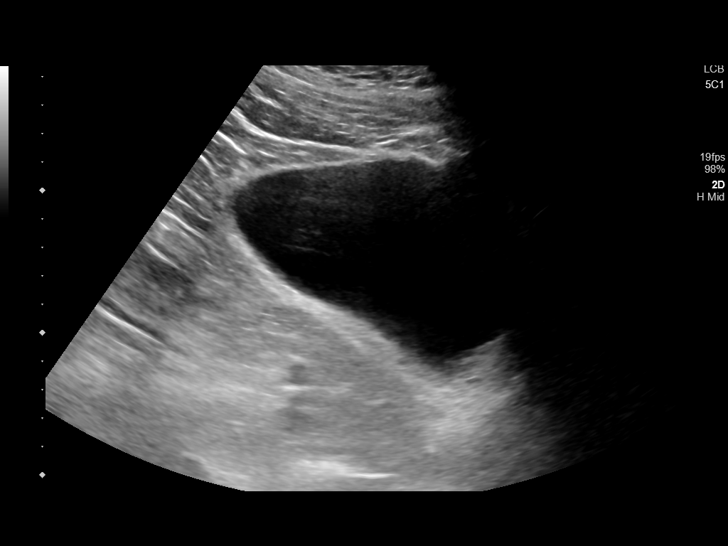
[im 22/35]
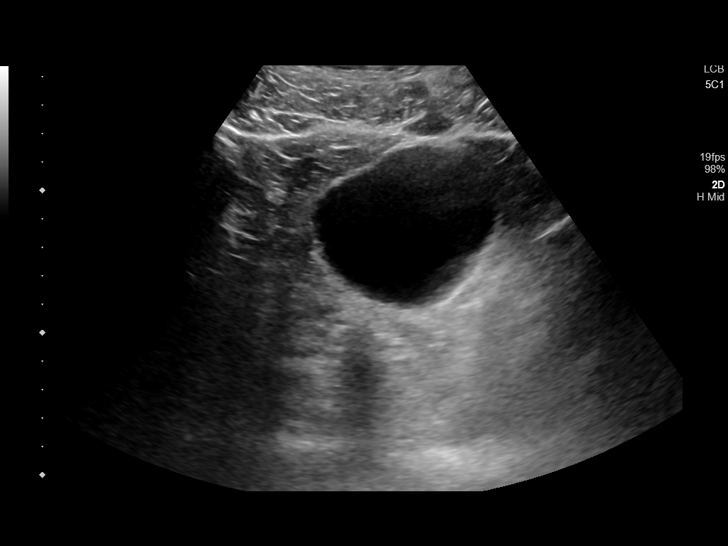
[im 23/35]
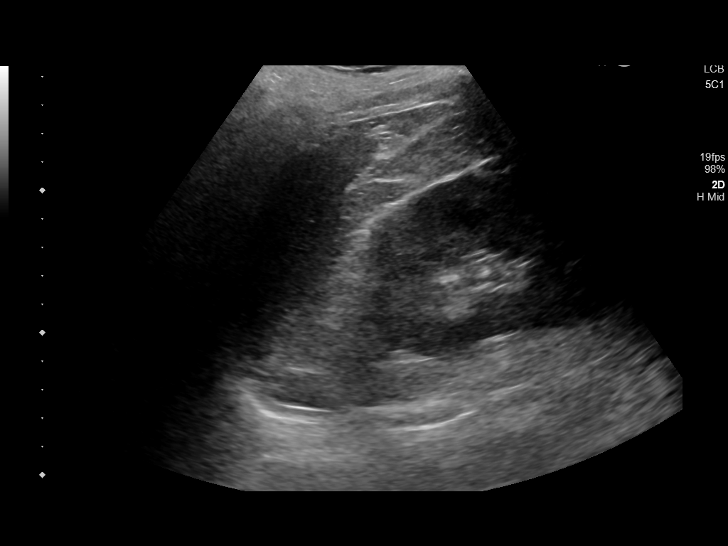
[im 26/35]
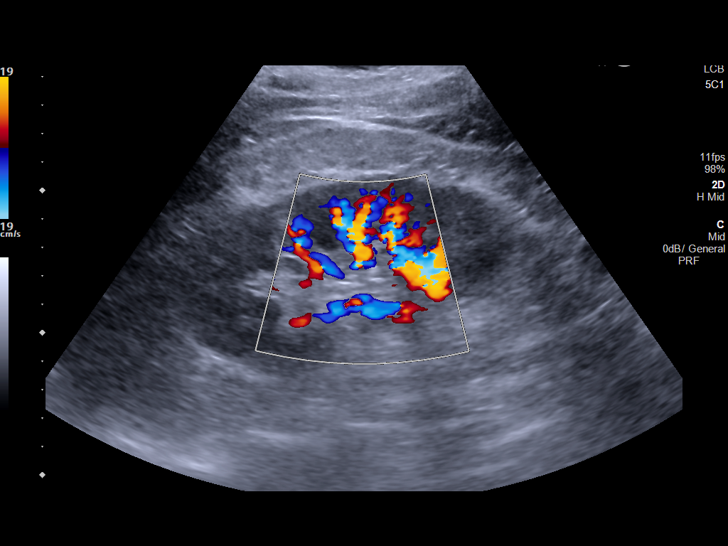
[im 29/35]
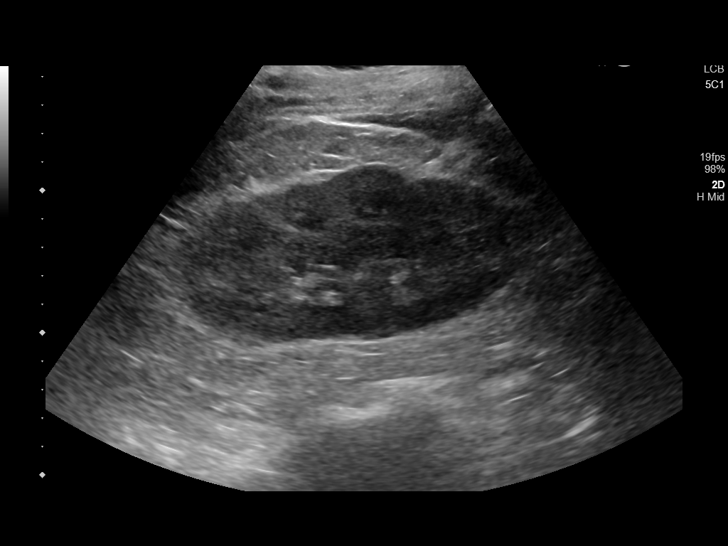
[im 32/35]
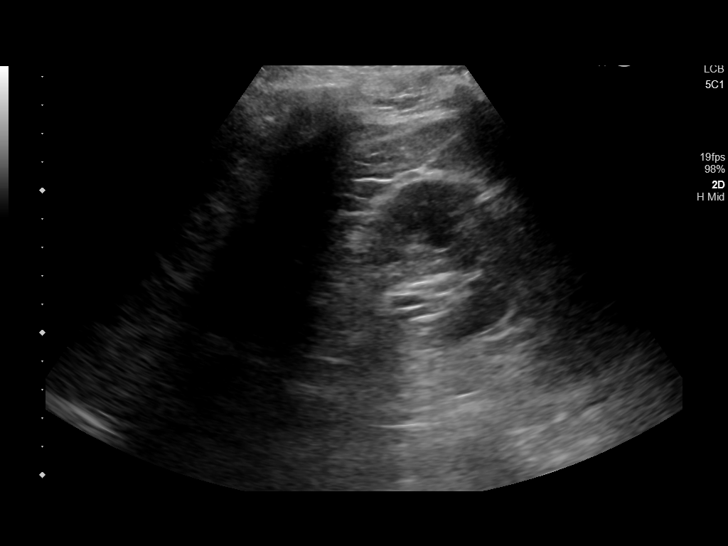
[im 35/35]
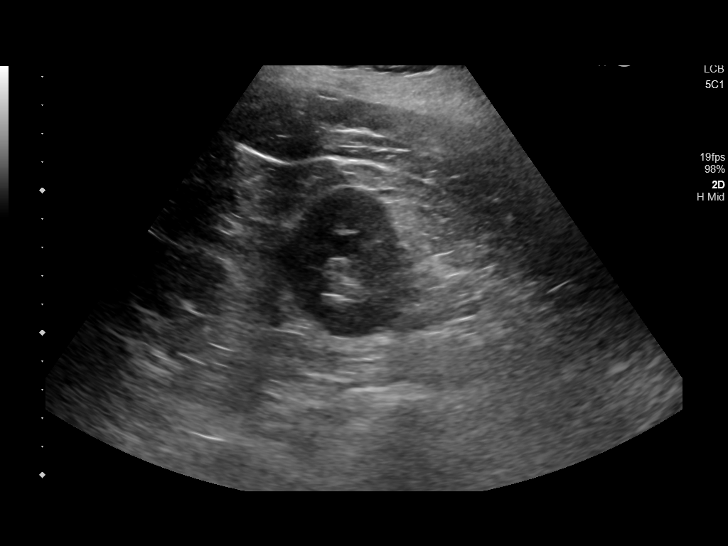

[14 of 25 positions shown; findings below may reference images not displayed]

FINDINGS: Right Kidney:

Renal measurements: 11.1 x 5.0 x 6.0 cm = volume: 177 mL .
Echogenicity within normal limits. No mass or hydronephrosis
visualized.

Left Kidney:

Renal measurements: 12.3 x 4.8 x 6.0 cm = volume: 187 mL.
Echogenicity within normal limits. No mass or hydronephrosis
visualized.

Bladder:

Appears normal for degree of bladder distention.
IMPRESSION: Normal exam.

## 2019-12-25 ENCOUNTER — Other Ambulatory Visit (HOSPITAL_COMMUNITY): Payer: Self-pay | Admitting: *Deleted

## 2019-12-25 MED ORDER — LURASIDONE HCL 40 MG PO TABS: 40.0000 mg | ORAL_TABLET | Freq: Every day | ORAL | 0 refills | Status: DC

## 2020-01-14 ENCOUNTER — Ambulatory Visit (HOSPITAL_COMMUNITY)
Admission: EM | Admit: 2020-01-14 | Discharge: 2020-01-14 | Disposition: A | Discharge status: Home / Self Care | Payer: BC Managed Care – PPO | Attending: Family Medicine | Admitting: Family Medicine

## 2020-01-14 ENCOUNTER — Encounter (HOSPITAL_COMMUNITY): Payer: Self-pay | Admitting: Emergency Medicine

## 2020-01-14 ENCOUNTER — Other Ambulatory Visit: Payer: Self-pay

## 2020-01-14 DIAGNOSIS — S61209A Unspecified open wound of unspecified finger without damage to nail, initial encounter: Secondary | ICD-10-CM

## 2020-01-14 MED ORDER — KETOROLAC TROMETHAMINE 30 MG/ML IJ SOLN
INTRAMUSCULAR | Status: AC
  Filled 2020-01-14: qty 1

## 2020-01-14 MED ORDER — KETOROLAC TROMETHAMINE 30 MG/ML IJ SOLN
30.0000 mg | Freq: Once | INTRAMUSCULAR | Status: AC
  Administered 2020-01-14: 17:00:00 30 mg via INTRAMUSCULAR

## 2020-01-14 NOTE — ED Provider Notes (Signed)
[place]    CSN: [id number] Arrival date & time: 01/14/20  1518      History   Chief Complaint Chief Complaint  Patient presents with   Finger Injury    HPI Thomas Williamson is a 44 y.o. male.   Patient is a 44 year old male who presents today with avulsion to the tip of the finger.  Happened while he was cutting a slimy with knife.  The finger is still actively bleeding.  Mild pain at the site.  Patient is not on any blood thinners.     Past Medical History:  Diagnosis Date   Alcohol use disorder, severe, in early remission ([place]) 03/30/2018   Allergy    Anxiety    Bipolar disorder, in partial remission, most recent episode hypomanic ([place]) 03/30/2018   Bleeding internal hemorrhoids 09/12/2013   Cannabis dependence ([place]) 07/13/2018   Daily use   Cervical disc disease 11/20/2014   Chronic anxiety 0/26/3785   Complication of anesthesia    Depression    GERD (gastroesophageal reflux disease)    Hernia, inguinal, bilateral 02/24/2011   High ankle sprain of right lower extremity 01/16/2015   History of hiatal hernia    Hx of hepatitis C 10/05/2017   -treated in 2019 -hepatology recommended no further surveillance needed except for LFTs with labs and see hepatologist if elevated   Hypertension    Lipids abnormal 09/29/2013   Pneumonia    PONV (postoperative nausea and vomiting)     Patient Active Problem List   Diagnosis Date Noted   Genetic testing 04/21/2019   Benign neoplasm of colon    Loose stools    History of colonic polyps 03/07/2019   Cardiac left ventricular ejection fraction 30-35 percent 88/50/2774   Alcoholic ketoacidosis 12/87/8676   Dyslipidemia 09/16/2018   Fever blister 09/16/2018   Cardiomyopathy, alcoholic ([place]) 72/10/4707   Left bundle branch block (LBBB) 09/16/2018   Cannabis dependence ([place]) 07/13/2018   Chronic anxiety 07/13/2018   Bipolar 1 disorder, depressed, full remission ([place]) 03/30/2018    Alcohol use disorder, severe, in early remission ([place]) 03/30/2018   Cervical disc disease 11/20/2014   Hypertension 02/24/2011    Past Surgical History:  Procedure Laterality Date   ANTERIOR CRUCIATE LIGAMENT REPAIR  2010   BIOPSY  03/13/2019   Procedure: BIOPSY;  Surgeon: Yetta Flock, MD;  Location: [facility];  Service: Gastroenterology;;   COLONOSCOPY N/A 03/13/2019   Procedure: COLONOSCOPY;  Surgeon: Yetta Flock, MD;  Location: [facility];  Service: Gastroenterology;  Laterality: N/A;   INGUINAL HERNIA REPAIR  02/23/2012   Procedure: LAPAROSCOPIC BILATERAL INGUINAL HERNIA REPAIR;  Surgeon: Shann Medal, MD;  Location: [facility];  Service: General;  Laterality: Bilateral;  Laparoscopic Bilateral Inguinal Hernia Repair with mesh   INSERTION OF MESH  02/23/2012   Procedure: INSERTION OF MESH;  Surgeon: Shann Medal, MD;  Location: [facility];  Service: General;  Laterality: N/A;   NOSE SURGERY  [id number]   POLYPECTOMY  03/13/2019   Procedure: POLYPECTOMY;  Surgeon: Yetta Flock, MD;  Location: [facility];  Service: Gastroenterology;;       Home Medications    Prior to Admission medications   Medication Sig Start Date End Date Taking? Authorizing Provider  buPROPion (WELLBUTRIN XL) 300 MG 24 hr tablet TAKE 1 TABLET BY MOUTH EVERY DAY 12/04/19  Yes Pucilowski, Olgierd A, MD  hydrochlorothiazide (HYDRODIURIL) 25 MG tablet Take 1 tablet (25 mg total) by mouth daily. 06/30/19  Yes Alysia Penna  A, MD  losartan (COZAAR) 100 MG tablet Take 1 tablet (100 mg total) by mouth daily. 12/11/19  Yes [name], MD  OLANZapine (ZYPREXA) 5 MG tablet Take 1 tablet (5 mg total) by mouth daily as needed (anxiety). 12/04/19 03/03/20 Yes Pucilowski, Olgierd A, MD  Omeprazole 20 MG TBEC Take 20 mg by mouth daily.    Yes [provider]  pravastatin (PRAVACHOL) 10 MG tablet Take 1 tablet (10 mg total) by mouth daily. 12/11/19  Yes [name], MD  Wheat Dextrin  [facility]) POWD Use as directed, daily 11/22/19  Yes Armbruster, Carlota Raspberry, MD  lurasidone (LATUDA) 40 MG TABS tablet Take 1 tablet (40 mg total) by mouth daily at 6 PM. 12/25/19 02/19/20  Arfeen, Arlyce Harman, MD  propranolol ER (INDERAL LA) 80 MG 24 hr capsule Take 1 capsule (80 mg total) by mouth daily. 11/15/19   [name], MD  valACYclovir (VALTREX) 1000 MG tablet Take 1 tablet (1,000 mg total) by mouth 2 (two) times daily as needed (fever blisters). 09/16/18   [name], MD    Family History Family History  Problem Relation Age of Onset   Hypertension Mother    Atrial fibrillation Mother    Hypertension Father    Leukemia Father 51   Prostate cancer Father 60   Diabetes Father    Diabetes Brother    Other Brother        growth on finger   Learning disabilities Paternal Aunt    Lung cancer Maternal Grandmother    Heart attack Maternal Grandfather    Congestive Heart Failure Paternal Grandmother    Colon cancer Neg Hx     Social History Social History   Tobacco Use   Smoking status: Former Smoker   Smokeless tobacco: Never Used  Scientific laboratory technician Use: Former   Substances: CBD  Substance Use Topics   Alcohol use: Yes    Alcohol/week: 4.0 standard drinks    Types: 4 Shots of liquor per week    Comment: (recovering alcoholic) occassioanl drink   Drug use: Yes    Frequency: 2.0 times per week    Types: Marijuana     Allergies   Hydrocodone   Review of Systems Review of Systems   Physical Exam Triage Vital Signs ED Triage Vitals  Enc Vitals Group     BP 01/14/20 1546 117/76     Pulse Rate 01/14/20 1546 79     Resp 01/14/20 1546 18     Temp 01/14/20 1546 97.9 F (36.6 C)     Temp Source 01/14/20 1546 Oral     SpO2 01/14/20 1546 96 %     Weight --      Height --      Head Circumference --      Peak Flow --      Pain Score 01/14/20 1548 7     Pain Loc --      Pain Edu? --      Excl. in [place]? --    No data found.  Updated  Vital Signs BP 117/76 (BP Location: Right Arm)    Pulse 79    Temp 97.9 F (36.6 C) (Oral)    Resp 18    SpO2 96%   Visual Acuity Right Eye Distance:   Left Eye Distance:   Bilateral Distance:    Right Eye Near:   Left Eye Near:    Bilateral Near:     Physical Exam  Vitals and nursing note reviewed.  Constitutional:      Appearance: Normal appearance.  HENT:     Head: Normocephalic and atraumatic.     Nose: Nose normal.  Eyes:     Conjunctiva/sclera: Conjunctivae normal.  Pulmonary:     Effort: Pulmonary effort is normal.  Musculoskeletal:        General: Normal range of motion.     Cervical back: Normal range of motion.  Skin:    General: Skin is warm and dry.     Comments: Avulsion to tip of left middle finger  Neurological:     Mental Status: He is alert.  Psychiatric:        Mood and Affect: Mood normal.      UC Treatments / Results  Labs (all labs ordered are listed, but only abnormal results are displayed) Labs Reviewed - No data to display  EKG   Radiology No results found.  Procedures Procedures (including critical care time)  Medications Ordered in UC Medications  ketorolac (TORADOL) 30 MG/ML injection 30 mg (has no administration in time range)    Initial Impression / Assessment and Plan / UC Course  I have reviewed the triage vital signs and the nursing notes.  Pertinent labs & imaging results that were available during my care of the patient were reviewed by me and considered in my medical decision making (see chart for details).     Avulsion of finger.  Cleaned well here today.  Applied Surgicel and wrapped tightly. We will have him leave dressing on for 24 hours and remove tomorrow. Toradol given here for pain.  He can take Tylenol ibuprofen for pain as needed at home. Follow up as needed for continued or worsening symptoms  Final Clinical Impressions(s) / UC Diagnoses   Final diagnoses:  Avulsion of finger, initial encounter      Discharge Instructions     Leave the dressing on for 24 hours.  You  can remove tomorrow  gently and gently wash hands.  If the area starts to slightly rebleed put another nonstick dressing on and wrapped tightly and leave on to stop the bleeding.  You can take Tylenol or ibuprofen for pain as needed Follow up as needed for continued or worsening symptoms     ED Prescriptions    None     PDMP not reviewed this encounter.   Orvan July, NP 01/14/20 1639

## 2020-01-14 NOTE — Discharge Instructions (Addendum)
Leave the dressing on for 24 hours.  You  can remove tomorrow  gently and gently wash hands.  If the area starts to slightly rebleed put another nonstick dressing on and wrapped tightly and leave on to stop the bleeding.  You can take Tylenol or ibuprofen for pain as needed Follow up as needed for continued or worsening symptoms

## 2020-02-05 ENCOUNTER — Telehealth (INDEPENDENT_AMBULATORY_CARE_PROVIDER_SITE_OTHER): Payer: BC Managed Care – PPO | Admitting: Psychiatry

## 2020-02-05 ENCOUNTER — Other Ambulatory Visit: Payer: Self-pay

## 2020-02-05 DIAGNOSIS — F1021 Alcohol dependence, in remission: Secondary | ICD-10-CM | POA: Diagnosis not present

## 2020-02-05 DIAGNOSIS — F3176 Bipolar disorder, in full remission, most recent episode depressed: Secondary | ICD-10-CM | POA: Diagnosis not present

## 2020-02-05 MED ORDER — OLANZAPINE 5 MG PO TABS
5.0000 mg | ORAL_TABLET | Freq: Every day | ORAL | 0 refills | Status: DC | PRN
Start: 2020-03-04 — End: 2020-04-08

## 2020-02-05 MED ORDER — BUPROPION HCL ER (XL) 300 MG PO TB24
300.0000 mg | ORAL_TABLET | Freq: Every day | ORAL | 0 refills | Status: DC
Start: 2020-03-04 — End: 2020-04-08

## 2020-02-05 NOTE — Progress Notes (Signed)
[facility] MD/PA/NP OP Progress Note  02/05/2020 9:39 AM Thomas Williamson  MRN:  [id number] Interview was conducted by phone as he was unable to connect to videoconferencing application and I verified that I was speaking with the correct person using two identifiers. I discussed the limitations of evaluation and management by telemedicine and  the availability of in person appointments. Patient expressed understanding and agreed to proceed. Participants in the visit: patient (location - home); physician (location - home office).  Chief Complaint: Less tremor since Li was dc.  HPI: 45yo male withBPAD and alcohol usedisorder severe and bipolar disorder. He was twice in inpatient rehab ([facility] and in [name]).He relapsed in October 2020 and went through acute withdrawal.[facility].Hestarted IM Vivitrol 380 mg on October 20th (sober since 11/18/18)but decided not to continue afterfew months.He was briefly on oral naltrexone afterwords.He lives with his parents. He has complained of loss of libido and we decreased dose of Lexapro and eventually discontinued it. Wellbutrin was increased instead.He takesolanzapine5 mgprn anxietyand we added Latuda for depression. He reports feeling less depressed since starting it.He had genetic testing done.Hestarted going Librarian, academic he will complete by February. He reports long standing resting tremor which interferes with his school/work and has seen neurologist lately for that. It is likely that tremor is related to lithium despite level being low. We decide to stop it and continue olanzapine 5 mg in am (takes it daily) and lurasidone 40 mg in PM.    Visit Diagnosis:    ICD-10-CM   1. Bipolar 1 disorder, depressed, full remission ([place])  F31.76   2. Alcohol use disorder, severe, in early remission ([place])  F10.21     Past Psychiatric History: Please see intake H&P.  Past Medical History:  Past Medical History:  Diagnosis  Date  . Alcohol use disorder, severe, in early remission ([place]) 03/30/2018  . Allergy   . Anxiety   . Bipolar disorder, in partial remission, most recent episode hypomanic ([place]) 03/30/2018  . Bleeding internal hemorrhoids 09/12/2013  . Cannabis dependence ([place]) 07/13/2018   Daily use  . Cervical disc disease 11/20/2014  . Chronic anxiety 07/13/2018  . Complication of anesthesia   . Depression   . GERD (gastroesophageal reflux disease)   . Hernia, inguinal, bilateral 02/24/2011  . High ankle sprain of right lower extremity 01/16/2015  . History of hiatal hernia   . Hx of hepatitis C 10/05/2017   -treated in 2019 -hepatology recommended no further surveillance needed except for LFTs with labs and see hepatologist if elevated  . Hypertension   . Lipids abnormal 09/29/2013  . Pneumonia   . PONV (postoperative nausea and vomiting)     Past Surgical History:  Procedure Laterality Date  . ANTERIOR CRUCIATE LIGAMENT REPAIR  2010  . BIOPSY  03/13/2019   Procedure: BIOPSY;  Surgeon: Yetta Flock, MD;  Location: [facility];  Service: Gastroenterology;;  . COLONOSCOPY N/A 03/13/2019   Procedure: COLONOSCOPY;  Surgeon: Yetta Flock, MD;  [facility];  Service: Gastroenterology;  Laterality: N/A;  . INGUINAL HERNIA REPAIR  02/23/2012   Procedure: LAPAROSCOPIC BILATERAL INGUINAL HERNIA REPAIR;  Surgeon: Shann Medal, MD;  Location: [facility];  Service: General;  Laterality: Bilateral;  Laparoscopic Bilateral Inguinal Hernia Repair with mesh  . INSERTION OF MESH  02/23/2012   Procedure: INSERTION OF MESH;  Surgeon: Shann Medal, MD;  Location: [facility];  Service: General;  Laterality: N/A;  . NOSE SURGERY  [id number]  . POLYPECTOMY  03/13/2019   Procedure: POLYPECTOMY;  Surgeon: Benancio Deeds, MD;  Location: [facility];  Service: Gastroenterology;;    Family Psychiatric History: None.  Family History:  Family History  Problem Relation Age of Onset  . Hypertension Mother    . Atrial fibrillation Mother   . Hypertension Father   . Leukemia Father 21  . Prostate cancer Father 22  . Diabetes Father   . Diabetes Brother   . Other Brother        growth on finger  . Learning disabilities Paternal Aunt   . Lung cancer Maternal Grandmother   . Heart attack Maternal Grandfather   . Congestive Heart Failure Paternal Grandmother   . Colon cancer Neg Hx     Social History:  Social History   Socioeconomic History  . Marital status: Single    Spouse name: Not on file  . Number of children: 0  . Years of education: Not on file  . Highest education level: Bachelor's degree (e.g., BA, AB, BS)  Occupational History  . Not on file  Tobacco Use  . Smoking status: Former Games developer  . Smokeless tobacco: Never Used  Vaping Use  . Vaping Use: Former  . Substances: CBD  Substance and Sexual Activity  . Alcohol use: Yes    Alcohol/week: 4.0 standard drinks    Types: 4 Shots of liquor per week    Comment: (recovering alcoholic) occassioanl drink  . Drug use: Yes    Frequency: 2.0 times per week    Types: Marijuana  . Sexual activity: Yes    Birth control/protection: Condom  Other Topics Concern  . 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 Hydrocodone     UPSETS STOMACH    Metabolic Disorder Labs: No results found for: HGBA1C, MPG No results found for: PROLACTIN Lab Results  Component Value Date   CHOL 241 (H) 12/11/2019   TRIG 386 (H) 12/11/2019   HDL 43 12/11/2019   CHOLHDL 5.6 (H)  12/11/2019   VLDL 63.2 (H) 09/19/2018   LDLCALC 139 (H) 12/11/2019   Lab Results  Component Value Date   TSH 0.54 12/11/2019   TSH 0.74 03/31/2016    Therapeutic Level Labs: Lab Results  Component Value Date   LITHIUM 0.3 (L) 03/08/2019   LITHIUM 0.6 12/18/2016   No results found for: VALPROATE No components found for:  CBMZ  Current Medications: Current Outpatient Medications  Medication Sig Dispense Refill  . [START ON 03/04/2020] buPROPion (WELLBUTRIN XL) 300 MG 24 hr tablet Take 1 tablet (300 mg total) by mouth daily. 90 tablet 0  . hydrochlorothiazide (HYDRODIURIL) 25 MG tablet Take 1 tablet (25 mg total) by mouth daily. 90 tablet 3  . losartan (COZAAR) 100 MG tablet Take 1 tablet (100 mg total) by mouth daily. 90 tablet 3  . lurasidone (LATUDA) 40 MG TABS tablet Take 1 tablet (40 mg total)  by mouth daily at 6 PM. 56 tablet 0  . [START ON 03/04/2020] OLANZapine (ZYPREXA) 5 MG tablet Take 1 tablet (5 mg total) by mouth daily as needed (anxiety). 90 tablet 0  . Omeprazole 20 MG TBEC Take 20 mg by mouth daily.     . pravastatin (PRAVACHOL) 10 MG tablet Take 1 tablet (10 mg total) by mouth daily. 90 tablet 3  . propranolol ER (INDERAL LA) 80 MG 24 hr capsule Take 1 capsule (80 mg total) by mouth daily. 30 capsule 2  . valACYclovir (VALTREX) 1000 MG tablet Take 1 tablet (1,000 mg total) by mouth 2 (two) times daily as needed (fever blisters). 60 tablet 5  . Wheat Dextrin (BENEFIBER) POWD Use as directed, daily  0   No current facility-administered medications for this visit.    Psychiatric Specialty Exam: Review of Systems  All other systems reviewed and are negative.   There were no vitals taken for this visit.There is no height or weight on file to calculate BMI.  General Appearance: NA  Eye Contact:  NA  Speech:  Clear and Coherent and Normal Rate  Volume:  Normal  Mood:  Some depression  Affect:  NA  Thought Process:  Goal Directed and Linear  Orientation:  Full  (Time, Place, and Person)  Thought Content: Logical   Suicidal Thoughts:  No  Homicidal Thoughts:  No  Memory:  Immediate;   Good Recent;   Good Remote;   Good  Judgement:  Good  Insight:  Good  Psychomotor Activity:  NA  Concentration:  Concentration: Good  Recall:  Good  Fund of Knowledge: Good  Language: Good  Akathisia:  Negative  Handed:  Right  AIMS (if indicated): not done  Assets:  Communication Skills Desire for Improvement Financial Resources/Insurance Housing Social Support Talents/Skills  ADL's:  Intact  Cognition: WNL  Sleep:  Good   Screenings: AUDIT   Health and safety inspector from 07/11/2018 in [facility]  Alcohol Use Disorder Identification Test Final Score (AUDIT) 30    GAD-7   Flowsheet Row Counselor from 07/11/2018 in [place]  Total GAD-7 Score 9    PHQ2-9   [place] Office Visit from 12/11/2019 in [facility] [place] from 07/11/2018 in [place]  PHQ-2 Total Score 0 2  PHQ-9 Total Score -- 8       Assessment and Plan: 45yo male withBPAD and alcohol usedisorder severe and bipolar disorder. He was twice in inpatient rehab ([place] and in [name]).He relapsed in October 2020 and went through acute withdrawal.[facility].Hestarted IM Vivitrol 380 mg on October 20th (sober since 11/18/18)but decided not to continue afterfew months.He was briefly on oral naltrexone afterwords.He lives with his parents. Mood has been stable. He has complained of loss of libido and we decreased dose of Lexapro and eventually discontinued it. Wellbutrin was increased instead.He takesolanzapine5 mgprn anxietyand we added Latuda for depression. He reports feeling less depressed since starting it.He had genetic testing done.Hestarted going Librarian, academic he will complete by February. He reported  long standing resting tremor which interferes with his school/work and has seen neurologist lately for that. It is likely that tremor is related to lithium despite level being low. We decide to stop it and tremor practically resolved. Mood is stable (altough he endorses mild depression) and he continues olanzapine 5 mg in am (takes it daily) and lurasidone 40 mg in PM.   Dx:  Alcohol use disorder severein remission; BPAD, depressedin remission  Plan:ContinueWellbutrin XLto 300 mg,olanzapine5 mg prn anxiety and Latuda 40 mg at HS(gets samples of the latter).Return to clinic in twomonths.The plan was discussed with patient who had an opportunity to ask questions and these were all answered. I spend6minutes invideo visitwith the patient.   Stephanie Acre, MD 02/05/2020, 9:39 AM

## 2020-02-06 ENCOUNTER — Other Ambulatory Visit: Payer: Self-pay | Admitting: Family Medicine

## 2020-02-13 DIAGNOSIS — H6690 Otitis media, unspecified, unspecified ear: Secondary | ICD-10-CM | POA: Diagnosis not present

## 2020-02-25 ENCOUNTER — Other Ambulatory Visit (HOSPITAL_COMMUNITY): Payer: Self-pay | Admitting: Psychiatry

## 2020-02-28 ENCOUNTER — Other Ambulatory Visit (HOSPITAL_COMMUNITY): Payer: Self-pay | Admitting: Psychiatry

## 2020-03-06 ENCOUNTER — Other Ambulatory Visit (HOSPITAL_COMMUNITY): Payer: Self-pay | Admitting: *Deleted

## 2020-03-06 MED ORDER — LURASIDONE HCL 40 MG PO TABS: 40.0000 mg | ORAL_TABLET | Freq: Every day | ORAL | 0 refills | Status: DC

## 2020-04-08 ENCOUNTER — Other Ambulatory Visit: Payer: Self-pay

## 2020-04-08 ENCOUNTER — Telehealth (INDEPENDENT_AMBULATORY_CARE_PROVIDER_SITE_OTHER): Payer: BC Managed Care – PPO | Admitting: Psychiatry

## 2020-04-08 DIAGNOSIS — F1021 Alcohol dependence, in remission: Secondary | ICD-10-CM

## 2020-04-08 DIAGNOSIS — F3176 Bipolar disorder, in full remission, most recent episode depressed: Secondary | ICD-10-CM | POA: Diagnosis not present

## 2020-04-08 MED ORDER — BUPROPION HCL ER (XL) 300 MG PO TB24: 300.0000 mg | ORAL_TABLET | Freq: Every day | ORAL | 0 refills | Status: DC

## 2020-04-08 MED ORDER — OLANZAPINE 5 MG PO TABS: 5.0000 mg | ORAL_TABLET | Freq: Every day | ORAL | 0 refills | Status: DC | PRN

## 2020-04-08 NOTE — Progress Notes (Signed)
[name]  04/08/2020 10:13 AM Thomas Williamson  MRN:  [id number] Interview was conducted by phone and I verified that I was speaking with the correct person using two identifiers. I discussed the limitations of evaluation and management by telemedicine and  the availability of in person appointments. Patient expressed understanding and agreed to proceed. Participants in the visit: patient (location - home); physician (location - home office).  Chief Complaint: "I'm doing well".  HPI: 45yo male withBPAD and alcohol usedisorder severe and bipolar disorder. He was twice in inpatient rehab ([place] and in [name]).He relapsed in October2020and went through acute withdrawal.[facility].Hestarted IM Vivitrol 380 mg on October 20th (sober since 11/18/18)but decided not to continue afterfew months.Hewasbrieflyon oral naltrexoneafterwords.He lives with his parents. Mood has been stable. He has complained of loss of libido and we decreased dose of Lexapro and eventually discontinued it. Wellbutrin was increased instead.He takesolanzapine5 mgprn anxietyand we added Latuda for depression. He reports feeling less depressed since starting it.He had genetic testing done.Hestarted going Librarian, academic he will complete by end of March. Hereported long standing resting tremor which interferes with his school/work and has seen neurologist lately for that. It is likely that tremor is related to lithium despite level being low. We decide to stop it and tremor resolved. Mood is stable and he continues on bupropion, olanzapine 5 mg in am (takes it daily) and lurasidone 40 mg in PM.   Visit Diagnosis:    ICD-10-CM   1. Bipolar 1 disorder, depressed, full remission ([place])  F31.76   2. Alcohol use disorder, severe, in sustained remission ([place])  F10.21     Past Psychiatric History: Please see intake H&P.  Past Medical History:  Past Medical History:   Diagnosis Date  . Alcohol use disorder, severe, in early remission ([place]) 03/30/2018  . Allergy   . Anxiety   . Bipolar disorder, in partial remission, most recent episode hypomanic ([place]) 03/30/2018  . Bleeding internal hemorrhoids 09/12/2013  . Cannabis dependence ([place]) 07/13/2018   Daily use  . Cervical disc disease 11/20/2014  . Chronic anxiety 07/13/2018  . Complication of anesthesia   . Depression   . GERD (gastroesophageal reflux disease)   . Hernia, inguinal, bilateral 02/24/2011  . High ankle sprain of right lower extremity 01/16/2015  . History of hiatal hernia   . Hx of hepatitis C 10/05/2017   -treated in 2019 -hepatology recommended no further surveillance needed except for LFTs with labs and see hepatologist if elevated  . Hypertension   . Lipids abnormal 09/29/2013  . Pneumonia   . PONV (postoperative nausea and vomiting)     Past Surgical History:  Procedure Laterality Date  . ANTERIOR CRUCIATE LIGAMENT REPAIR  2010  . BIOPSY  03/13/2019   Procedure: BIOPSY;  Surgeon: Yetta Flock, MD;  Location: [facility];  Service: Gastroenterology;;  . COLONOSCOPY N/A 03/13/2019   Procedure: COLONOSCOPY;  Surgeon: Yetta Flock, MD;  Location: [facility];  Service: Gastroenterology;  Laterality: N/A;  . INGUINAL HERNIA REPAIR  02/23/2012   Procedure: LAPAROSCOPIC BILATERAL INGUINAL HERNIA REPAIR;  Surgeon: Shann Medal, MD;  Location: [facility];  Service: General;  Laterality: Bilateral;  Laparoscopic Bilateral Inguinal Hernia Repair with mesh  . INSERTION OF MESH  02/23/2012   Procedure: INSERTION OF MESH;  Surgeon: Shann Medal, MD;  [facility];  Service: General;  Laterality: N/A;  . NOSE SURGERY  [id number]  . POLYPECTOMY  03/13/2019   Procedure:  POLYPECTOMY;  Surgeon: Yetta Flock, MD;  Location: [facility];  Service: Gastroenterology;;    Family Psychiatric History: None.  Family History:  Family History  Problem Relation Age of Onset  .  Hypertension Mother   . Atrial fibrillation Mother   . Hypertension Father   . Leukemia Father 58  . Prostate cancer Father 75  . Diabetes Father   . Diabetes Brother   . Other Brother        growth on finger  . Learning disabilities Paternal Aunt   . Lung cancer Maternal Grandmother   . Heart attack Maternal Grandfather   . Congestive Heart Failure Paternal Grandmother   . Colon cancer Neg Hx     Social History:  Social History   Socioeconomic History  . Marital status: Single    Spouse name: Not on file  . Number of children: 0  . Years of education: Not on file  . Highest education level: Bachelor's degree (e.g., BA, AB, BS)  Occupational History  . Not on file  Tobacco Use  . Smoking status: Former Research scientist (life sciences)  . Smokeless tobacco: Never Used  Vaping Use  . Vaping Use: Former  . Substances: CBD  Substance and Sexual Activity  . Alcohol use: Yes    Alcohol/week: 4.0 standard drinks    Types: 4 Shots of liquor per week    Comment: (recovering alcoholic) occassioanl drink  . Drug use: Yes    Frequency: 2.0 times per week    Types: Marijuana  . Sexual activity: Yes    Birth control/protection: Condom  Other Topics Concern  . 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 Hydrocodone     UPSETS STOMACH    Metabolic Disorder Labs: No results found for: HGBA1C, MPG No results found for: PROLACTIN Lab Results  Component Value Date   CHOL 241 (H) 12/11/2019   TRIG 386 (H) 12/11/2019   HDL 43  12/11/2019   CHOLHDL 5.6 (H) 12/11/2019   VLDL 63.2 (H) 09/19/2018   LDLCALC 139 (H) 12/11/2019   Lab Results  Component Value Date   TSH 0.54 12/11/2019   TSH 0.74 03/31/2016    Therapeutic Level Labs: Lab Results  Component Value Date   LITHIUM 0.3 (L) 03/08/2019   LITHIUM 0.6 12/18/2016   No results found for: VALPROATE No components found for:  CBMZ  Current Medications: Current Outpatient Medications  Medication Sig Dispense Refill  . [START ON 06/02/2020] buPROPion (WELLBUTRIN XL) 300 MG 24 hr tablet Take 1 tablet (300 mg total) by mouth daily. 90 tablet 0  . hydrochlorothiazide (HYDRODIURIL) 25 MG tablet Take 1 tablet (25 mg total) by mouth daily. 90 tablet 3  . losartan (COZAAR) 100 MG tablet Take 1 tablet (100 mg total) by mouth daily. 90 tablet 3  . lurasidone (LATUDA) 40 MG TABS tablet Take 1 tablet (40 mg total) by mouth daily at  6 PM. 56 tablet 0  . [START ON 06/02/2020] OLANZapine (ZYPREXA) 5 MG tablet Take 1 tablet (5 mg total) by mouth daily as needed (anxiety). 90 tablet 0  . Omeprazole 20 MG TBEC Take 20 mg by mouth daily.     . pravastatin (PRAVACHOL) 10 MG tablet Take 1 tablet (10 mg total) by mouth daily. 90 tablet 3  . propranolol ER (INDERAL LA) 80 MG 24 hr capsule TAKE 1 CAPSULE BY MOUTH EVERY DAY 90 capsule 0  . valACYclovir (VALTREX) 1000 MG tablet Take 1 tablet (1,000 mg total) by mouth 2 (two) times daily as needed (fever blisters). 60 tablet 5  . Wheat Dextrin (BENEFIBER) POWD Use as directed, daily  0   No current facility-administered medications for this visit.       Psychiatric Specialty Exam: Review of Systems  All other systems reviewed and are negative.   There were no vitals taken for this visit.There is no height or weight on file to calculate BMI.  General Appearance: NA  Eye Contact:  NA  Speech:  Clear and Coherent and Normal Rate  Volume:  Normal  Mood:  Euthymic  Affect:  NA  Thought Process:  Goal Directed and Linear   Orientation:  Full (Time, Place, and Person)  Thought Content: Logical   Suicidal Thoughts:  No  Homicidal Thoughts:  No  Memory:  Immediate;   Good Recent;   Good Remote;   Good  Judgement:  Good  Insight:  Good  Psychomotor Activity:  NA  Concentration:  Concentration: Good  Recall:  Good  Fund of Knowledge: Good  Language: Good  Akathisia:  Negative  Handed:  Right  AIMS (if indicated): not done  Assets:  Communication Skills Desire for Improvement Financial Resources/Insurance Housing Social Support Talents/Skills  ADL's:  Intact  Cognition: WNL  Sleep:  Good   Screenings: AUDIT   Health and safety inspector from 07/11/2018 in [place]  Alcohol Use Disorder Identification Test Final Score (AUDIT) 30    GAD-7   Flowsheet Row Counselor from 07/11/2018 in [place]  Total GAD-7 Score 9    PHQ2-9   [place] Office Visit from 12/11/2019 in [place] at [place] from 07/11/2018 in [place]  PHQ-2 Total Score 0 2  PHQ-9 Total Score - 8       Assessment and Plan: 45yo male withBPAD and alcohol usedisorder severe and bipolar disorder. He was twice in inpatient rehab ([place] and in [name]).He relapsed in October2020and went through acute withdrawal.[facility].Hestarted IM Vivitrol 380 mg on October 20th (sober since 11/18/18)but decided not to continue afterfew months.Hewasbrieflyon oral naltrexoneafterwords.He lives with his parents. Mood has been stable. He has complained of loss of libido and we decreased dose of Lexapro and eventually discontinued it. Wellbutrin was increased instead.He takesolanzapine5 mgprn anxietyand we added Latuda for depression. He reports feeling less depressed since starting it.He had genetic testing done.Hestarted going Librarian, academic he will complete by end  of March. Hereported long standing resting tremor which interferes with his school/work and has seen neurologist lately for that. It is likely that tremor is related to lithium despite level being low. We decide to stop it and tremor resolved. Mood is stable and he continues on bupropion, olanzapine 5 mg in am (takes it daily) and lurasidone 40 mg in PM.  Dx: BPAD,in remission; Alcohol use disorder severein remission;   Plan:ContinueWellbutrin XLto300 mg,olanzapine5 mg prn  anxiety and Latuda 40 mg at HS(gets samples of the latter).Return to clinic in threemonths with a new provider.The plan was discussed with patient who had an opportunity to ask questions and these were all answered. I spend51minutes inphone visitwith the patient.    Stephanie Acre, MD 04/08/2020, 10:13 AM

## 2020-04-09 DIAGNOSIS — D225 Melanocytic nevi of trunk: Secondary | ICD-10-CM | POA: Diagnosis not present

## 2020-04-09 DIAGNOSIS — L821 Other seborrheic keratosis: Secondary | ICD-10-CM | POA: Diagnosis not present

## 2020-04-09 DIAGNOSIS — L570 Actinic keratosis: Secondary | ICD-10-CM | POA: Diagnosis not present

## 2020-04-10 ENCOUNTER — Other Ambulatory Visit: Payer: Self-pay | Admitting: Family Medicine

## 2020-04-10 ENCOUNTER — Other Ambulatory Visit: Payer: Self-pay

## 2020-04-10 ENCOUNTER — Telehealth: Payer: Self-pay | Admitting: Family Medicine

## 2020-04-10 MED ORDER — LOSARTAN POTASSIUM 100 MG PO TABS: 100.0000 mg | ORAL_TABLET | Freq: Every day | ORAL | 0 refills | Status: DC

## 2020-04-10 NOTE — Telephone Encounter (Signed)
Pt Rx sent 

## 2020-04-10 NOTE — Telephone Encounter (Signed)
losartan (COZAAR) 100 MG tablet   [id number] [address] [place] Phone:  [phone]  Fax:  [phone]        He needs this medication as soon as possible

## 2020-04-11 ENCOUNTER — Other Ambulatory Visit: Payer: Self-pay

## 2020-04-11 MED ORDER — LOSARTAN POTASSIUM 50 MG PO TABS: ORAL_TABLET | ORAL | 0 refills | Status: DC

## 2020-04-11 NOTE — Telephone Encounter (Signed)
Patient needs  losartan (COZAAR) 100 MG tablet sent to the pharmacy for 50 mg 2 times daily   The pharmacy has the 50 mg in stock but they don't have the 100 mg in stock  [id number] [address], [place] Phone:  [phone]  Fax:  [phone]

## 2020-04-11 NOTE — Telephone Encounter (Signed)
FYI

## 2020-04-11 NOTE — Telephone Encounter (Signed)
New Rx for Losartan 50 mg sen to pharmacy, pt is aware to take 2 tablets (50 mg) which is equivalent to his regular dose of 100 mg daily. Pt pharmacy state that they do not have Losartan 100 mg in stock.

## 2020-05-03 ENCOUNTER — Other Ambulatory Visit: Payer: Self-pay | Admitting: Family Medicine

## 2020-05-07 DIAGNOSIS — F3131 Bipolar disorder, current episode depressed, mild: Secondary | ICD-10-CM | POA: Diagnosis not present

## 2020-05-21 DIAGNOSIS — F3131 Bipolar disorder, current episode depressed, mild: Secondary | ICD-10-CM | POA: Diagnosis not present

## 2020-05-22 ENCOUNTER — Other Ambulatory Visit: Payer: Self-pay | Admitting: Family Medicine

## 2020-05-28 ENCOUNTER — Other Ambulatory Visit (HOSPITAL_COMMUNITY): Payer: Self-pay | Admitting: *Deleted

## 2020-05-28 MED ORDER — LURASIDONE HCL 40 MG PO TABS: 40.0000 mg | ORAL_TABLET | Freq: Every day | ORAL | 0 refills | Status: DC

## 2020-06-04 DIAGNOSIS — F3131 Bipolar disorder, current episode depressed, mild: Secondary | ICD-10-CM | POA: Diagnosis not present

## 2020-06-13 ENCOUNTER — Ambulatory Visit (INDEPENDENT_AMBULATORY_CARE_PROVIDER_SITE_OTHER): Payer: BC Managed Care – PPO | Admitting: Adult Health

## 2020-06-13 ENCOUNTER — Other Ambulatory Visit: Payer: Self-pay

## 2020-06-13 ENCOUNTER — Encounter: Payer: Self-pay | Admitting: Adult Health

## 2020-06-13 VITALS — BP 110/80 | HR 79 | Temp 97.3°F | Ht 71.0 in | Wt 190.2 lb

## 2020-06-13 DIAGNOSIS — H6121 Impacted cerumen, right ear: Secondary | ICD-10-CM | POA: Diagnosis not present

## 2020-06-13 NOTE — Progress Notes (Signed)
Subjective:    Patient ID: Thomas Williamson, male    DOB: 1975/02/14, 45 y.o.   MRN: [id number]  HPI  45 year old male who  has a past medical history of Alcohol use disorder, severe, in early remission ([place]) (03/30/2018), Allergy, Anxiety, Bipolar disorder, in partial remission, most recent episode hypomanic ([place]) (03/30/2018), Bleeding internal hemorrhoids (09/12/2013), Cannabis dependence ([place]) (07/13/2018), Cervical disc disease (11/20/2014), Chronic anxiety (8/52/7782), Complication of anesthesia, Depression, GERD (gastroesophageal reflux disease), Hernia, inguinal, bilateral (02/24/2011), High ankle sprain of right lower extremity (01/16/2015), History of hiatal hernia, hepatitis C (10/05/2017), Hypertension, Lipids abnormal (09/29/2013), Pneumonia, and PONV (postoperative nausea and vomiting).  He complains of ear fullness of right ear. Reports that last night he was using a q-tip to clean his right ear and believes he pushed all the wax down into his ear. Denies pain or drainage from the ear.   Review of Systems See HPI   Past Medical History:  Diagnosis Date  . Alcohol use disorder, severe, in early remission ([place]) 03/30/2018  . Allergy   . Anxiety   . Bipolar disorder, in partial remission, most recent episode hypomanic ([place]) 03/30/2018  . Bleeding internal hemorrhoids 09/12/2013  . Cannabis dependence ([place]) 07/13/2018   Daily use  . Cervical disc disease 11/20/2014  . Chronic anxiety 07/13/2018  . Complication of anesthesia   . Depression   . GERD (gastroesophageal reflux disease)   . Hernia, inguinal, bilateral 02/24/2011  . High ankle sprain of right lower extremity 01/16/2015  . History of hiatal hernia   . Hx of hepatitis C 10/05/2017   -treated in 2019 -hepatology recommended no further surveillance needed except for LFTs with labs and see hepatologist if elevated  . Hypertension   . Lipids abnormal 09/29/2013  . Pneumonia   . PONV (postoperative nausea and vomiting)     Social  History   Socioeconomic History  . Marital status: Single    Spouse name: Not on file  . Number of children: 0  . Years of education: Not on file  . Highest education level: Bachelor's degree (e.g., BA, AB, BS)  Occupational History  . Not on file  Tobacco Use  . Smoking status: Former Research scientist (life sciences)  . Smokeless tobacco: Never Used  Vaping Use  . Vaping Use: Former  . Substances: CBD  Substance and Sexual Activity  . Alcohol use: Yes    Alcohol/week: 4.0 standard drinks    Types: 4 Shots of liquor per week    Comment: (recovering alcoholic) occassioanl drink  . Drug use: Yes    Frequency: 2.0 times per week    Types: Marijuana  . Sexual activity: Yes    Birth control/protection: Condom  Other Topics Concern  . 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 ANTERIOR CRUCIATE LIGAMENT REPAIR  2010  . BIOPSY  03/13/2019  Procedure: BIOPSY;  Surgeon: Yetta Flock, MD;  Location: [facility];  Service: Gastroenterology;;  . COLONOSCOPY N/A 03/13/2019   Procedure: COLONOSCOPY;  Surgeon: Yetta Flock, MD;  [facility];  Service: Gastroenterology;  Laterality: N/A;  . INGUINAL HERNIA REPAIR  02/23/2012   Procedure: LAPAROSCOPIC BILATERAL INGUINAL HERNIA REPAIR;  Surgeon: Shann Medal, MD;  Location: [facility];  Service: General;  Laterality: Bilateral;  Laparoscopic Bilateral Inguinal Hernia Repair with mesh  . INSERTION OF MESH  02/23/2012   Procedure:  INSERTION OF MESH;  Surgeon: Shann Medal, MD;  [facility];  Service: General;  Laterality: N/A;  . NOSE SURGERY  [id number]  . POLYPECTOMY  03/13/2019   Procedure: POLYPECTOMY;  Surgeon: Yetta Flock, MD;  Location: [facility];  Service: Gastroenterology;;    Family History  Problem Relation Age of Onset  . Hypertension Mother   . Atrial fibrillation Mother   . Hypertension Father   . Leukemia Father 27  . Prostate cancer Father 54  . Diabetes Father   . Diabetes Brother   . Other Brother        growth on finger  . Learning disabilities Paternal Aunt   . Lung cancer Maternal Grandmother   . Heart attack Maternal Grandfather   . Congestive Heart Failure Paternal Grandmother   . Colon cancer Neg Hx     Allergies  Allergen Reactions  . Hydrocodone     UPSETS STOMACH    Current Outpatient Medications on File Prior to Visit  Medication Sig Dispense Refill  . buPROPion (WELLBUTRIN XL) 300 MG 24 hr tablet Take 1 tablet (300 mg total) by mouth daily. 90 tablet 0  . hydrochlorothiazide (HYDRODIURIL) 25 MG tablet Take 1 tablet (25 mg total) by mouth daily. 90 tablet 3  . losartan (COZAAR) 50 MG tablet TAKE 2 TABLETS (100 MG) DAILY. 60 tablet 1  . lurasidone (LATUDA) 40 MG TABS tablet Take 1 tablet (40 mg total) by mouth daily at 6 PM. 56 tablet 0  . OLANZapine (ZYPREXA) 5 MG tablet Take 1 tablet (5 mg total) by mouth daily as needed (anxiety). 90 tablet 0  . Omeprazole 20 MG TBEC Take 20 mg by mouth daily.     . pravastatin (PRAVACHOL) 10 MG tablet Take 1 tablet (10 mg total) by mouth daily. 90 tablet 3  . propranolol ER (INDERAL LA) 80 MG 24 hr capsule TAKE 1 CAPSULE BY MOUTH EVERY DAY 90 capsule 0  . valACYclovir (VALTREX) 1000 MG tablet Take 1 tablet (1,000 mg total) by mouth 2 (two) times daily as needed (fever blisters). 60 tablet 5  . Wheat Dextrin (BENEFIBER) POWD Use as directed, daily  0   No current facility-administered medications on file prior to visit.     BP 110/80 (BP Location: Right Arm, Patient Position: Sitting, Cuff Size: Normal)   Pulse 79   Temp (!) 97.3 F (36.3 C) (Oral)   Ht 5\' 11"  (1.803 m)   Wt 190 lb 3.2 oz (86.3 kg)   SpO2 96%   BMI 26.53 kg/m       Objective:   Physical Exam Vitals and nursing note reviewed.  Constitutional:      Appearance: Normal appearance.  HENT:     Right Ear: There is impacted cerumen.     Left Ear: Tympanic membrane and external ear normal.  Neurological:     General: No focal deficit present.     Mental Status: He is alert and oriented to  person, place, and time.  Psychiatric:        Mood and Affect: Mood normal.        Behavior: Behavior normal.        Thought Content: Thought content normal.        Judgment: Judgment normal.       Assessment & Plan:  1. Impacted cerumen of right ear - Verbal consent obtained  Warm water was applied and gentle ear lavage performed to the right  Ear.. There were no complications and following the disimpaction the tympanic membrane were visible. Tympanic membrane was intact following the procedure.  Auditory canals are normal.  The patient reported relief of symptoms after removal of cerumen. Patient tolerated procedure well.   Dorothyann Peng, NP

## 2020-06-18 DIAGNOSIS — F3131 Bipolar disorder, current episode depressed, mild: Secondary | ICD-10-CM | POA: Diagnosis not present

## 2020-06-25 ENCOUNTER — Other Ambulatory Visit: Payer: Self-pay

## 2020-06-25 ENCOUNTER — Encounter (HOSPITAL_COMMUNITY): Payer: Self-pay | Admitting: Psychiatry

## 2020-06-25 ENCOUNTER — Telehealth (INDEPENDENT_AMBULATORY_CARE_PROVIDER_SITE_OTHER): Payer: BC Managed Care – PPO | Admitting: Psychiatry

## 2020-06-25 DIAGNOSIS — F3176 Bipolar disorder, in full remission, most recent episode depressed: Secondary | ICD-10-CM

## 2020-06-25 DIAGNOSIS — F1021 Alcohol dependence, in remission: Secondary | ICD-10-CM

## 2020-06-25 MED ORDER — LURASIDONE HCL 60 MG PO TABS: 60.0000 mg | ORAL_TABLET | Freq: Every day | ORAL | 0 refills | Status: DC

## 2020-06-25 MED ORDER — BUPROPION HCL ER (XL) 300 MG PO TB24: 300.0000 mg | ORAL_TABLET | Freq: Every day | ORAL | 0 refills | Status: DC

## 2020-06-25 MED ORDER — OLANZAPINE 5 MG PO TABS: 5.0000 mg | ORAL_TABLET | Freq: Every day | ORAL | 0 refills | Status: DC | PRN

## 2020-06-25 NOTE — Progress Notes (Signed)
[facility] MD/PA/NP OP Progress Note  06/25/2020 1:13 PM Nevaeh Casillas  MRN:  [id number] Interview was conducted by phone and I verified that I was speaking with the correct person using two identifiers. I discussed the limitations of evaluation and management by telemedicine and  the availability of in person appointments. Patient expressed understanding and agreed to proceed. Participants in the visit: patient (location - home); physician (location - home office).  Chief Complaint: "stressed".  HPI: Patient is a 45yo male withBPAD and alcohol usedisorder severe and bipolar disorder. He was previously a patient of Dr.Pucilowska who is no longer with [facility].  He reports being stressed due to not having a job, he is now living with his parents. His dog also passed away recently.  Reports increased anxiety, panic attacks and waking up in the middle of the night. He reports having racing thoughts, feels like manic episodes. He is sleeping about 8 hours, even when he wakes up, he can go back to sleep in an hour.  Eating ok. He has been drinking some but not at home, has not been drunk in a while. Denies any suicidal thoughts.   Per Dr.Pucilowska, patient was twice in inpatient rehab ([place] and in [name]).He relapsed in October2020and went through acute withdrawal.Hecompleted [facility].Hestarted IM Vivitrol 380 mg on October 20th (sober since 11/18/18)but decided not to continue afterfew months.Hewasbrieflyon oral naltrexoneafterwords.He lives with his parents. Mood has been stable. He has complained of loss of libido and we decreased dose of Lexapro and eventually discontinued it. Wellbutrin was increased instead.He takesolanzapine5 mgprn anxietyand we added Latuda for depression. He reports feeling less depressed since starting it.He had genetic testing done.Hestarted going Librarian, academic he will complete by end of March. Hereported long standing resting tremor  which interferes with his school/work and has seen neurologist lately for that. It is likely that tremor is related to lithium despite level being low. We decide to stop it and tremor resolved. Mood is stable and he continues on bupropion, olanzapine 5 mg in am (takes it daily) and lurasidone 40 mg in PM.   Visit Diagnosis:    ICD-10-CM   1. Bipolar 1 disorder, depressed, full remission ([place])  F31.76   2. Alcohol use disorder, severe, in sustained remission ([place])  F10.21     Past Psychiatric History: Please see intake H&P.  Past Medical History:  Past Medical History:  Diagnosis Date  . Alcohol use disorder, severe, in early remission ([place]) 03/30/2018  . Allergy   . Anxiety   . Bipolar disorder, in partial remission, most recent episode hypomanic ([place]) 03/30/2018  . Bleeding internal hemorrhoids 09/12/2013  . Cannabis dependence ([place]) 07/13/2018   Daily use  . Cervical disc disease 11/20/2014  . Chronic anxiety 07/13/2018  . Complication of anesthesia   . Depression   . GERD (gastroesophageal reflux disease)   . Hernia, inguinal, bilateral 02/24/2011  . High ankle sprain of right lower extremity 01/16/2015  . History of hiatal hernia   . Hx of hepatitis C 10/05/2017   -treated in 2019 -hepatology recommended no further surveillance needed except for LFTs with labs and see hepatologist if elevated  . Hypertension   . Lipids abnormal 09/29/2013  . Pneumonia   . PONV (postoperative nausea and vomiting)     Past Surgical History:  Procedure Laterality Date  . ANTERIOR CRUCIATE LIGAMENT REPAIR  2010  . BIOPSY  03/13/2019   Procedure: BIOPSY;  Surgeon: Yetta Flock, MD;  [facility];  Service: Gastroenterology;;  . COLONOSCOPY N/A 03/13/2019   Procedure: COLONOSCOPY;  Surgeon: Yetta Flock, MD;  [facility];  Service: Gastroenterology;  Laterality: N/A;  . INGUINAL HERNIA REPAIR  02/23/2012   Procedure: LAPAROSCOPIC BILATERAL INGUINAL HERNIA REPAIR;   Surgeon: Shann Medal, MD;  Location: [facility];  Service: General;  Laterality: Bilateral;  Laparoscopic Bilateral Inguinal Hernia Repair with mesh  . INSERTION OF MESH  02/23/2012   Procedure: INSERTION OF MESH;  Surgeon: Shann Medal, MD;  Location[facility];  Service: General;  Laterality: N/A;  . NOSE SURGERY  [id number]  . POLYPECTOMY  03/13/2019   Procedure: POLYPECTOMY;  Surgeon: Yetta Flock, MD;  Location: [facility];  Service: Gastroenterology;;    Family Psychiatric History: None.  Family History:  Family History  Problem Relation Age of Onset  . Hypertension Mother   . Atrial fibrillation Mother   . Hypertension Father   . Leukemia Father 41  . Prostate cancer Father 36  . Diabetes Father   . Diabetes Brother   . Other Brother        growth on finger  . Learning disabilities Paternal Aunt   . Lung cancer Maternal Grandmother   . Heart attack Maternal Grandfather   . Congestive Heart Failure Paternal Grandmother   . Colon cancer Neg Hx     Social History:  Social History   Socioeconomic History  . Marital status: Single    Spouse name: Not on file  . Number of children: 0  . Years of education: Not on file  . Highest education level: Bachelor's degree (e.g., BA, AB, BS)  Occupational History  . Not on file  Tobacco Use  . Smoking status: Former Research scientist (life sciences)  . Smokeless tobacco: Never Used  Vaping Use  . Vaping Use: Former  . Substances: CBD  Substance and Sexual Activity  . Alcohol use: Yes    Alcohol/week: 4.0 standard drinks    Types: 4 Shots of liquor per week    Comment: (recovering alcoholic) occassioanl drink  . Drug use: Yes    Frequency: 2.0 times per week    Types: Marijuana  . Sexual activity: Yes    Birth control/protection: Condom  Other Topics Concern  . 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 Hydrocodone     UPSETS STOMACH    Metabolic Disorder Labs: No results found for: HGBA1C, MPG No results found for: PROLACTIN Lab Results  Component Value Date   CHOL 241 (H) 12/11/2019   TRIG 386 (H) 12/11/2019   HDL 43 12/11/2019   CHOLHDL 5.6 (H) 12/11/2019   VLDL 63.2 (H) 09/19/2018   LDLCALC 139 (H) 12/11/2019   Lab Results  Component Value Date   TSH 0.54 12/11/2019   TSH 0.74 03/31/2016    Therapeutic Level Labs: Lab Results  Component Value Date   LITHIUM 0.3 (L) 03/08/2019  LITHIUM 0.6 12/18/2016   No results found for: VALPROATE No components found for:  CBMZ  Current Medications: Current Outpatient Medications  Medication Sig Dispense Refill  . buPROPion (WELLBUTRIN XL) 300 MG 24 hr tablet Take 1 tablet (300 mg total) by mouth daily. 90 tablet 0  . hydrochlorothiazide (HYDRODIURIL) 25 MG tablet Take 1 tablet (25 mg total) by mouth daily. 90 tablet 3  . losartan (COZAAR) 50 MG tablet TAKE 2 TABLETS (100 MG) DAILY. 60 tablet 1  . lurasidone (LATUDA) 40 MG TABS tablet Take 1 tablet (40 mg total) by mouth daily at 6 PM. 56 tablet 0  . OLANZapine (ZYPREXA) 5 MG tablet Take 1 tablet (5 mg total) by mouth daily as needed (anxiety). 90 tablet 0  . Omeprazole 20 MG TBEC Take 20 mg by mouth daily.     . pravastatin (PRAVACHOL) 10 MG tablet Take 1 tablet (10 mg total) by mouth daily. 90 tablet 3  . propranolol ER (INDERAL LA) 80 MG 24 hr capsule TAKE 1 CAPSULE BY MOUTH EVERY DAY 90 capsule 0  . valACYclovir (VALTREX) 1000 MG tablet Take 1 tablet (1,000 mg total) by mouth 2 (two) times daily as needed (fever blisters). 60 tablet 5   . Wheat Dextrin (BENEFIBER) POWD Use as directed, daily  0   No current facility-administered medications for this visit.       Psychiatric Specialty Exam: Review of Systems  All other systems reviewed and are negative.   There were no vitals taken for this visit.There is no height or weight on file to calculate BMI.  General Appearance: NA  Eye Contact:  NA  Speech:  Clear and Coherent and Normal Rate  Volume:  Normal  Mood:  stressed  Affect:  NA  Thought Process:  Goal Directed and Linear  Orientation:  Full (Time, Place, and Person)  Thought Content: Logical   Suicidal Thoughts:  No  Homicidal Thoughts:  No  Memory:  Immediate;   Good Recent;   Good Remote;   Good  Judgement:  Good  Insight:  Good  Psychomotor Activity:  NA  Concentration:  Concentration: Good  Recall:  Good  Fund of Knowledge: Good  Language: Good  Akathisia:  Negative  Handed:  Right  AIMS (if indicated): not done  Assets:  Communication Skills Desire for Improvement Financial Resources/Insurance Housing Social Support Talents/Skills  ADL's:  Intact  Cognition: WNL  Sleep:  Good   Screenings: AUDIT   Health and safety inspector from 07/11/2018 in [facility]  Alcohol Use Disorder Identification Test Final Score (AUDIT) 30    GAD-7   Flowsheet Row Counselor from 07/11/2018 in [place]  Total GAD-7 Score 9    PHQ2-9   [place] Office Visit from 12/11/2019 in [place] [facility] [place] from 07/11/2018 in [place]  PHQ-2 Total Score 0 2  PHQ-9 Total Score -- 8       Assessment and Plan: 44yo male withBPAD and alcohol usedisorder severe and bipolar disorder. He was twice in inpatient rehab ([place] and in [name]).He relapsed in October2020and went through acute withdrawal.Hecompleted CD-IOP at [facility].Hestarted IM Vivitrol 380 mg on October 20th  (sober since 11/18/18)but decided not to continue afterfew months.Hewasbrieflyon oral naltrexoneafterwords.He lives with his parents. Mood has been stable. He has complained of loss of libido and we decreased dose of Lexapro and eventually discontinued it. Wellbutrin was increased instead.He takesolanzapine5 mgprn anxietyand we added Latuda for depression.  He reports feeling less depressed since starting it.He had genetic testing done.Hestarted going Librarian, academic he will complete by end of March. Hereported long standing resting tremor which interferes with his school/work and has seen neurologist lately for that. It is likely that tremor is related to lithium despite level being low. We decide to stop it and tremor resolved. Mood is stable and he continues on bupropion, olanzapine 5 mg in am (takes it daily) and lurasidone 40 mg in PM.  Dx: BPAD,in remission; Alcohol use disorder severein remission;   Plan:ContinueWellbutrin XLat300 mg,olanzapine5 mg prn anxiety and increase  Latuda to 60 mg at HS(gets samples of the latter). Patient made aware that he will need to find a new physician, that the clinic will send a letter with options to find new physician. Patient agreeable. Patient recommended to continue to see his therapist. The plan was discussed with patient who had an opportunity to ask questions and these were all answered. I spend24minutes inphone visitwith the patient.    Elvin So, MD 06/25/2020, 1:13 PM

## 2020-07-09 ENCOUNTER — Telehealth (HOSPITAL_COMMUNITY): Payer: Self-pay | Admitting: *Deleted

## 2020-07-09 MED ORDER — LURASIDONE HCL 60 MG PO TABS: 60.0000 mg | ORAL_TABLET | Freq: Every day | ORAL | 0 refills | Status: DC

## 2020-07-09 NOTE — Telephone Encounter (Signed)
Pt called and says he's received letter regarding office situation and referrals so is asking for a month's refill of Latuda samples.

## 2020-07-09 NOTE — Telephone Encounter (Signed)
Please provide him 30 days sample of Latuda if available. He need to establish his care with new provider.

## 2020-07-29 ENCOUNTER — Other Ambulatory Visit: Payer: Self-pay | Admitting: Family Medicine

## 2020-07-30 ENCOUNTER — Other Ambulatory Visit: Payer: Self-pay | Admitting: Family Medicine

## 2020-08-06 ENCOUNTER — Other Ambulatory Visit: Payer: Self-pay | Admitting: Family Medicine

## 2020-08-07 DIAGNOSIS — F411 Generalized anxiety disorder: Secondary | ICD-10-CM | POA: Diagnosis not present

## 2020-08-07 DIAGNOSIS — F331 Major depressive disorder, recurrent, moderate: Secondary | ICD-10-CM | POA: Diagnosis not present

## 2020-08-21 ENCOUNTER — Other Ambulatory Visit: Payer: Self-pay | Admitting: Family Medicine

## 2020-08-21 DIAGNOSIS — F411 Generalized anxiety disorder: Secondary | ICD-10-CM | POA: Diagnosis not present

## 2020-08-21 DIAGNOSIS — F331 Major depressive disorder, recurrent, moderate: Secondary | ICD-10-CM | POA: Diagnosis not present

## 2020-09-23 DIAGNOSIS — F331 Major depressive disorder, recurrent, moderate: Secondary | ICD-10-CM | POA: Diagnosis not present

## 2020-09-23 DIAGNOSIS — F411 Generalized anxiety disorder: Secondary | ICD-10-CM | POA: Diagnosis not present

## 2021-01-18 ENCOUNTER — Other Ambulatory Visit: Payer: Self-pay | Admitting: Family Medicine

## 2021-02-07 DIAGNOSIS — F411 Generalized anxiety disorder: Secondary | ICD-10-CM | POA: Diagnosis not present

## 2021-02-07 DIAGNOSIS — F331 Major depressive disorder, recurrent, moderate: Secondary | ICD-10-CM | POA: Diagnosis not present

## 2021-02-16 ENCOUNTER — Other Ambulatory Visit: Payer: Self-pay | Admitting: Family Medicine

## 2021-02-25 ENCOUNTER — Other Ambulatory Visit: Payer: Self-pay | Admitting: Family Medicine

## 2021-02-28 ENCOUNTER — Other Ambulatory Visit: Payer: Self-pay

## 2021-02-28 ENCOUNTER — Telehealth: Payer: Self-pay | Admitting: Family Medicine

## 2021-02-28 MED ORDER — PRAVASTATIN SODIUM 10 MG PO TABS: 10.0000 mg | ORAL_TABLET | Freq: Every day | ORAL | 0 refills | Status: DC

## 2021-02-28 NOTE — Telephone Encounter (Signed)
Pt Rx sent was sent to pt pharmacy enough to last to his appointment on 03/10/2021

## 2021-02-28 NOTE — Telephone Encounter (Signed)
Pt was notified.  

## 2021-02-28 NOTE — Telephone Encounter (Signed)
Patient called because refill for pravastatin (PRAVACHOL) 10 MG tablet is being denied. Told patient that it has been awhile since Dr. Sarajane Jews has seen him. Patient started to yell about not having medication and how "I need to make sure his medication is refilled". Asked patient if he would like to go ahead and schedule an appointment to see Dr. Sarajane Jews. Patient continued to yell about needing his medication and said that it is too important of a medication of for "Dr. Sarajane Jews to decide to stop refilling". Asked patient again if he would like to schedule an appointment. Patient declined and said he is moving to [facility] and just wants his medication.  Informed patient a message would be sent back but it is likely he will need to be seen. Patient started to yell again about how he needs the medication and "we need to refill it" and disconnected call.  Please advise.

## 2021-02-28 NOTE — Telephone Encounter (Signed)
Patient called back to schedule appointment. Asked if original message could be updated to let Dr. Sarajane Jews know that he did schedule appointment.

## 2021-03-10 ENCOUNTER — Ambulatory Visit (INDEPENDENT_AMBULATORY_CARE_PROVIDER_SITE_OTHER): Payer: BC Managed Care – PPO | Admitting: Family Medicine

## 2021-03-10 ENCOUNTER — Encounter: Payer: Self-pay | Admitting: Family Medicine

## 2021-03-10 VITALS — BP 120/86 | HR 68 | Temp 98.7°F | Ht 71.0 in | Wt 181.0 lb

## 2021-03-10 DIAGNOSIS — Z0000 Encounter for general adult medical examination without abnormal findings: Secondary | ICD-10-CM

## 2021-03-10 DIAGNOSIS — Z23 Encounter for immunization: Secondary | ICD-10-CM

## 2021-03-10 MED ORDER — LOSARTAN POTASSIUM-HCTZ 100-25 MG PO TABS: 1.0000 | ORAL_TABLET | Freq: Every day | ORAL | 3 refills | Status: DC

## 2021-03-10 NOTE — Progress Notes (Signed)
° °  Subjective:    Patient ID: Thomas Williamson, male    DOB: 03/22/1975, 46 y.o.   MRN: [id number]  HPI Here for a well exam. He feels great. He will be moving to [place] later this year.    Review of Systems  Constitutional: Negative.   HENT: Negative.    Eyes: Negative.   Respiratory: Negative.    Cardiovascular: Negative.   Gastrointestinal: Negative.   Genitourinary: Negative.   Musculoskeletal: Negative.   Skin: Negative.   Neurological: Negative.   Psychiatric/Behavioral: Negative.        Objective:   Physical Exam Constitutional:      General: He is not in acute distress.    Appearance: Normal appearance. He is well-developed. He is not diaphoretic.  HENT:     Head: Normocephalic and atraumatic.     Right Ear: External ear normal.     Left Ear: External ear normal.     Nose: Nose normal.     Mouth/Throat:     Pharynx: No oropharyngeal exudate.  Eyes:     General: No scleral icterus.       Right eye: No discharge.        Left eye: No discharge.     Conjunctiva/sclera: Conjunctivae normal.     Pupils: Pupils are equal, round, and reactive to light.  Neck:     Thyroid: No thyromegaly.     Vascular: No JVD.     Trachea: No tracheal deviation.  Cardiovascular:     Rate and Rhythm: Normal rate and regular rhythm.     Heart sounds: Normal heart sounds. No murmur heard.   No friction rub. No gallop.  Pulmonary:     Effort: Pulmonary effort is normal. No respiratory distress.     Breath sounds: Normal breath sounds. No wheezing or rales.  Chest:     Chest wall: No tenderness.  Abdominal:     General: Bowel sounds are normal. There is no distension.     Palpations: Abdomen is soft. There is no mass.     Tenderness: There is no abdominal tenderness. There is no guarding or rebound.  Genitourinary:    Penis: Normal. No tenderness.      Testes: Normal.     Prostate: Normal.     Rectum: Normal. Guaiac result negative.  Musculoskeletal:        General: No tenderness.  Normal range of motion.     Cervical back: Neck supple.  Lymphadenopathy:     Cervical: No cervical adenopathy.  Skin:    General: Skin is warm and dry.     Coloration: Skin is not pale.     Findings: No erythema or rash.  Neurological:     Mental Status: He is alert and oriented to person, place, and time.     Cranial Nerves: No cranial nerve deficit.     Motor: No abnormal muscle tone.     Coordination: Coordination normal.     Deep Tendon Reflexes: Reflexes are normal and symmetric. Reflexes normal.  Psychiatric:        Behavior: Behavior normal.        Thought Content: Thought content normal.        Judgment: Judgment normal.          Assessment & Plan:  Well exam. We discussed diet and exercise. For the HTN, we will increase the Losartan and change to Losartan HCT 100-25 once daily. Get fasting labs. Alysia Penna, MD

## 2021-03-11 ENCOUNTER — Other Ambulatory Visit: Payer: Self-pay

## 2021-03-11 DIAGNOSIS — E7800 Pure hypercholesterolemia, unspecified: Secondary | ICD-10-CM

## 2021-03-11 LAB — CBC WITH DIFFERENTIAL/PLATELET
Basophils Absolute: 0.2 10*3/uL — ABNORMAL HIGH (ref 0.0–0.1)
Basophils Relative: 1.2 % (ref 0.0–3.0)
Eosinophils Absolute: 0.1 10*3/uL (ref 0.0–0.7)
Eosinophils Relative: 0.9 % (ref 0.0–5.0)
HCT: 41.6 % (ref 39.0–52.0)
Hemoglobin: 13.7 g/dL (ref 13.0–17.0)
Lymphocytes Relative: 30 % (ref 12.0–46.0)
Lymphs Abs: 3.9 10*3/uL (ref 0.7–4.0)
MCHC: 32.9 g/dL (ref 30.0–36.0)
MCV: 98.1 fl (ref 78.0–100.0)
Monocytes Absolute: 0.9 10*3/uL (ref 0.1–1.0)
Monocytes Relative: 6.8 % (ref 3.0–12.0)
Neutro Abs: 7.9 10*3/uL — ABNORMAL HIGH (ref 1.4–7.7)
Neutrophils Relative %: 61.1 % (ref 43.0–77.0)
Platelets: 338 10*3/uL (ref 150.0–400.0)
RBC: 4.24 Mil/uL (ref 4.22–5.81)
RDW: 12.9 % (ref 11.5–15.5)
WBC: 12.9 10*3/uL — ABNORMAL HIGH (ref 4.0–10.5)

## 2021-03-11 LAB — BASIC METABOLIC PANEL
BUN: 19 mg/dL (ref 6–23)
CO2: 32 mEq/L (ref 19–32)
Calcium: 10.6 mg/dL — ABNORMAL HIGH (ref 8.4–10.5)
Chloride: 99 mEq/L (ref 96–112)
Creatinine, Ser: 1.01 mg/dL (ref 0.40–1.50)
GFR: 89.63 mL/min (ref >60.00)
Glucose, Bld: 84 mg/dL (ref 70–99)
Potassium: 4.4 mEq/L (ref 3.5–5.1)
Sodium: 138 mEq/L (ref 135–145)

## 2021-03-11 LAB — HEPATIC FUNCTION PANEL
ALT: 21 U/L (ref 0–53)
AST: 18 U/L (ref 0–37)
Albumin: 5 g/dL (ref 3.5–5.2)
Alkaline Phosphatase: 90 U/L (ref 39–117)
Bilirubin, Direct: 0.1 mg/dL (ref 0.0–0.3)
Total Bilirubin: 0.6 mg/dL (ref 0.2–1.2)
Total Protein: 7.8 g/dL (ref 6.0–8.3)

## 2021-03-11 LAB — LIPID PANEL
Cholesterol: 279 mg/dL — ABNORMAL HIGH (ref 0–200)
HDL: 49.8 mg/dL (ref >39.00)
Total CHOL/HDL Ratio: 6
Triglycerides: 403 mg/dL — ABNORMAL HIGH (ref 0.0–149.0)

## 2021-03-11 LAB — LDL CHOLESTEROL, DIRECT: Direct LDL: 175 mg/dL

## 2021-03-11 LAB — PSA: PSA: 1.75 ng/mL (ref 0.10–4.00)

## 2021-03-11 LAB — HEMOGLOBIN A1C: Hgb A1c MFr Bld: 5.6 % (ref 4.6–6.5)

## 2021-03-11 LAB — TSH: TSH: 1.97 u[IU]/mL (ref 0.35–5.50)

## 2021-03-11 MED ORDER — PRAVASTATIN SODIUM 40 MG PO TABS: 40.0000 mg | ORAL_TABLET | Freq: Every day | ORAL | 3 refills | Status: DC

## 2021-03-15 ENCOUNTER — Other Ambulatory Visit: Payer: Self-pay | Admitting: Family Medicine

## 2021-04-24 DIAGNOSIS — R112 Nausea with vomiting, unspecified: Secondary | ICD-10-CM | POA: Diagnosis not present

## 2021-05-05 DIAGNOSIS — F411 Generalized anxiety disorder: Secondary | ICD-10-CM | POA: Diagnosis not present

## 2021-05-05 DIAGNOSIS — F331 Major depressive disorder, recurrent, moderate: Secondary | ICD-10-CM | POA: Diagnosis not present

## 2021-07-29 DIAGNOSIS — F411 Generalized anxiety disorder: Secondary | ICD-10-CM | POA: Diagnosis not present

## 2021-07-29 DIAGNOSIS — F331 Major depressive disorder, recurrent, moderate: Secondary | ICD-10-CM | POA: Diagnosis not present

## 2021-09-22 DIAGNOSIS — Z7289 Other problems related to lifestyle: Secondary | ICD-10-CM | POA: Diagnosis not present

## 2021-09-22 DIAGNOSIS — E876 Hypokalemia: Secondary | ICD-10-CM | POA: Diagnosis not present

## 2021-09-22 DIAGNOSIS — F319 Bipolar disorder, unspecified: Secondary | ICD-10-CM | POA: Diagnosis not present

## 2021-09-22 DIAGNOSIS — I959 Hypotension, unspecified: Secondary | ICD-10-CM | POA: Diagnosis not present

## 2021-09-22 DIAGNOSIS — E7800 Pure hypercholesterolemia, unspecified: Secondary | ICD-10-CM | POA: Diagnosis not present

## 2021-09-22 DIAGNOSIS — Z6822 Body mass index (BMI) 22.0-22.9, adult: Secondary | ICD-10-CM | POA: Diagnosis not present

## 2021-09-22 DIAGNOSIS — Z79899 Other long term (current) drug therapy: Secondary | ICD-10-CM | POA: Diagnosis not present

## 2021-09-22 DIAGNOSIS — K219 Gastro-esophageal reflux disease without esophagitis: Secondary | ICD-10-CM | POA: Diagnosis not present

## 2021-09-22 DIAGNOSIS — K602 Anal fissure, unspecified: Secondary | ICD-10-CM | POA: Diagnosis not present

## 2021-09-22 DIAGNOSIS — I10 Essential (primary) hypertension: Secondary | ICD-10-CM | POA: Diagnosis not present

## 2021-09-22 DIAGNOSIS — K4020 Bilateral inguinal hernia, without obstruction or gangrene, not specified as recurrent: Secondary | ICD-10-CM | POA: Diagnosis not present

## 2021-09-22 DIAGNOSIS — F1010 Alcohol abuse, uncomplicated: Secondary | ICD-10-CM | POA: Diagnosis not present

## 2021-09-29 ENCOUNTER — Ambulatory Visit (INDEPENDENT_AMBULATORY_CARE_PROVIDER_SITE_OTHER): Payer: BC Managed Care – PPO | Admitting: Family Medicine

## 2021-09-29 ENCOUNTER — Encounter: Payer: Self-pay | Admitting: Family Medicine

## 2021-09-29 VITALS — BP 128/80 | HR 75 | Temp 98.2°F | Ht 71.0 in | Wt 162.5 lb

## 2021-09-29 DIAGNOSIS — E876 Hypokalemia: Secondary | ICD-10-CM | POA: Diagnosis not present

## 2021-09-29 DIAGNOSIS — K602 Anal fissure, unspecified: Secondary | ICD-10-CM | POA: Diagnosis not present

## 2021-09-29 LAB — BASIC METABOLIC PANEL
BUN: 14 mg/dL (ref 6–23)
CO2: 34 mEq/L — ABNORMAL HIGH (ref 19–32)
Calcium: 10.1 mg/dL (ref 8.4–10.5)
Chloride: 101 mEq/L (ref 96–112)
Creatinine, Ser: 0.86 mg/dL (ref 0.40–1.50)
GFR: 103.95 mL/min (ref >60.00)
Glucose, Bld: 97 mg/dL (ref 70–99)
Potassium: 4.2 mEq/L (ref 3.5–5.1)
Sodium: 141 mEq/L (ref 135–145)

## 2021-09-29 NOTE — Progress Notes (Signed)
Established Patient Office Visit  Subjective   Patient ID: Thomas Williamson, male    DOB: [birth year]  Age: 46 y.o. MRN: [id number]  Chief Complaint  Patient presents with   Anal Fissure    Had it a few years ago; started bothering him again last week.    HPI   Thomas Williamson is here with pain anal region which started couple weeks ago.  He does recall straining a couple times and had a couple episodes of some bright bleeding but pain greater than bleeding.  Pain was especially intense last Sunday through Thursday.  He went to urgent care and then referred to ER as apparently had low blood pressure urgent care.  During his evaluation it was determined that his potassium was very low 2.5.  He does have a longstanding history of alcoholism and had been on recent binge.  He has been dry since ER visit.  Was not eating a lot of food at the time his potassium is low.  Does apparently take losartan HCTZ  Denies any diarrhea at this time.  No further bloody stools.  Last colonoscopy 2/21.  History of painful anal fissure previously.  Has been using some type of steroid cream given through the ER.  This seems to help somewhat.  Denies any palpitations, chest pains, dizziness.  Recent ER notes reviewed  Past Medical History:  Diagnosis Date   Alcohol use disorder, severe, in early remission ([place]) 03/30/2018   Allergy    Anxiety    Bipolar disorder, in partial remission, most recent episode hypomanic ([place]) 03/30/2018   Bleeding internal hemorrhoids 09/12/2013   Cannabis dependence ([place]) 07/13/2018   Daily use   Cervical disc disease 11/20/2014   Chronic anxiety 7/82/9562   Complication of anesthesia    Depression    GERD (gastroesophageal reflux disease)    Hernia, inguinal, bilateral 02/24/2011   High ankle sprain of right lower extremity 01/16/2015   History of hiatal hernia    Hx of hepatitis C 10/05/2017   -treated in 2019 -hepatology recommended no further surveillance needed except for LFTs with labs  and see hepatologist if elevated   Hypertension    Lipids abnormal 09/29/2013   Pneumonia    PONV (postoperative nausea and vomiting)    Past Surgical History:  Procedure Laterality Date   ANTERIOR CRUCIATE LIGAMENT REPAIR  2010   BIOPSY  03/13/2019   Procedure: BIOPSY;  Surgeon: Yetta Flock, MD;  [facility];  Service: Gastroenterology;;   COLONOSCOPY N/A 03/13/2019   per Dr. Havery Moros, adenomatous polyps, repeat in 3 yrs   INGUINAL HERNIA REPAIR  02/23/2012   Procedure: LAPAROSCOPIC BILATERAL INGUINAL HERNIA REPAIR;  Surgeon: Shann Medal, MD;  [facility];  Service: General;  Laterality: Bilateral;  Laparoscopic Bilateral Inguinal Hernia Repair with mesh   INSERTION OF MESH  02/23/2012   Procedure: INSERTION OF MESH;  Surgeon: Shann Medal, MD;  [facility];  Service: General;  Laterality: N/A;   NOSE SURGERY  [id number]   POLYPECTOMY  03/13/2019   Procedure: POLYPECTOMY;  Surgeon: Yetta Flock, MD;  [facility];  Service: Gastroenterology;;    reports that he has quit smoking. He has never used smokeless tobacco. He reports current alcohol use of about 4.0 standard drinks of alcohol per week. He reports current drug use. Frequency: 2.00 times per week. Drug: Marijuana. family history includes Atrial fibrillation in his mother; Cancer in his father; Congestive Heart Failure in his paternal grandmother; Diabetes in  his brother and father; Heart attack in his maternal grandfather; Hypertension in his father and mother; Learning disabilities in his paternal aunt; Leukemia (age of onset: 23) in his father; Lung cancer in his maternal grandmother; Other in his brother; Prostate cancer (age of onset: 23) in his father. Allergies  Allergen Reactions   Hydrocodone     UPSETS STOMACH    Review of Systems  Constitutional:  Negative for malaise/fatigue.  Eyes:  Negative for blurred vision.  Respiratory:  Negative for shortness of breath.    Cardiovascular:  Negative for chest pain.  Gastrointestinal:  Negative for diarrhea, melena, nausea and vomiting.  Neurological:  Negative for dizziness, weakness and headaches.      Objective:     BP 128/80 (BP Location: Right Arm, Patient Position: Sitting, Cuff Size: Normal)   Pulse 75   Temp 98.2 F (36.8 C) (Oral)   Ht '5\' 11"'$  (1.803 m)   Wt 162 lb 8 oz (73.7 kg)   SpO2 98%   BMI 22.66 kg/m    Physical Exam Vitals reviewed.  Constitutional:      Appearance: Normal appearance.  Cardiovascular:     Rate and Rhythm: Normal rate and regular rhythm.  Pulmonary:     Effort: Pulmonary effort is normal.     Breath sounds: Normal breath sounds.  Genitourinary:    Comments: He has fairly large fissure around 2 o'clock position of the anus.  No active bleeding.  1 fairly small skin tag around 12 o'clock position.  No other concerning lesions noted.  No surrounding erythema.  No purulent drainage.  No fluctuance. Neurological:     Mental Status: He is alert.      No results found for any visits on 09/29/21.    The 10-year ASCVD risk score (Arnett DK, et al., 2019) is: 4.6%    Assessment & Plan:   Problem List Items Addressed This Visit   None Visit Diagnoses     Hypokalemia    -  Primary   Relevant Orders   Basic metabolic panel   Anal fissure         Patient has anal fissure around 2 o'clock position.  Has had a couple of previous colonoscopies including 2/21.  Pain improved at this time.  Recommend sitz bath as.  Keep area clean with soap and water daily.  Avoid straining with plenty fluids and fiber and stool softeners if necessary.  If recurrent pain consider topical diltiazem or nitroglycerin paste  Recheck basic metabolic panel.  Recent ER notes reviewed.  Suspect his drop in potassium related to recent alcohol binge.  He knows importance of abstinence from alcohol completely.  Handout on high potassium foods given.  No follow-ups on file.    Carolann Littler, MD

## 2021-10-22 DIAGNOSIS — F331 Major depressive disorder, recurrent, moderate: Secondary | ICD-10-CM | POA: Diagnosis not present

## 2021-10-22 DIAGNOSIS — F1020 Alcohol dependence, uncomplicated: Secondary | ICD-10-CM | POA: Diagnosis not present

## 2021-10-24 DIAGNOSIS — F331 Major depressive disorder, recurrent, moderate: Secondary | ICD-10-CM | POA: Diagnosis not present

## 2021-10-24 DIAGNOSIS — F411 Generalized anxiety disorder: Secondary | ICD-10-CM | POA: Diagnosis not present

## 2021-10-27 DIAGNOSIS — Z6823 Body mass index (BMI) 23.0-23.9, adult: Secondary | ICD-10-CM | POA: Diagnosis not present

## 2021-10-27 DIAGNOSIS — R030 Elevated blood-pressure reading, without diagnosis of hypertension: Secondary | ICD-10-CM | POA: Diagnosis not present

## 2021-10-27 DIAGNOSIS — H6122 Impacted cerumen, left ear: Secondary | ICD-10-CM | POA: Diagnosis not present

## 2021-10-29 ENCOUNTER — Encounter: Payer: Self-pay | Admitting: Family Medicine

## 2021-10-29 ENCOUNTER — Ambulatory Visit (INDEPENDENT_AMBULATORY_CARE_PROVIDER_SITE_OTHER): Payer: BC Managed Care – PPO | Admitting: Family Medicine

## 2021-10-29 ENCOUNTER — Inpatient Hospital Stay: Payer: BC Managed Care – PPO | Admitting: Family Medicine

## 2021-10-29 VITALS — BP 160/112 | HR 90 | Temp 98.1°F | Wt 157.0 lb

## 2021-10-29 DIAGNOSIS — I10 Essential (primary) hypertension: Secondary | ICD-10-CM

## 2021-10-29 DIAGNOSIS — F3176 Bipolar disorder, in full remission, most recent episode depressed: Secondary | ICD-10-CM

## 2021-10-29 DIAGNOSIS — I426 Alcoholic cardiomyopathy: Secondary | ICD-10-CM | POA: Diagnosis not present

## 2021-10-29 MED ORDER — METOPROLOL SUCCINATE ER 100 MG PO TB24: 100.0000 mg | ORAL_TABLET | Freq: Every day | ORAL | 3 refills | Status: DC

## 2021-10-29 MED ORDER — AMLODIPINE BESYLATE 5 MG PO TABS: 5.0000 mg | ORAL_TABLET | Freq: Every day | ORAL | 3 refills | Status: DC

## 2021-10-29 NOTE — Progress Notes (Signed)
   Subjective:    Patient ID: Thomas Williamson, male    DOB: 1975/07/17, 46 y.o.   MRN: [id number]  HPI Here to follow up on an ED visit on 09-22-21 and on an urgent care visit on 10-27-21. He admits to being under a lot of stress lately, partly because he was laid off his job 2 months ago. Prior to that he "fell off the wagon" 4 months ago and started drinking alcohol heavily again. At one point he was drinking a whole fifth of whiskey every day, but he has cut this down to 1/2 of of a fifth a day. In August this led to him having constant abdominal pains with vomiting and diarrhea. On 09-26-21 he went to the ED and his electrolytes were way out of balance. His potassium was 2.5, his magnesium was 1.5, and his creatinine was 1.25. He was given IV fluids and was sent home on 2 weeks of potassium and magnesium supplements. His BP was low that day, as expected when he was so dehydrated. Then on 10-27-21 he went to an urgent care to have his ears irrigated for cerumen impactions, and his BP was 160/116. At that time he was taking Losartan HCT 100-25 daily and Propranolol ER 80 mg daily. He was told to follow up with [name]. He has been checking his BP at home, and he has been getting BP readings that remain in this range. He feels weak but denies any headache or chest pressure or SOB. No swelling in the hands or feet. He continues to see Dr. Angelique Blonder for psychiatric care. He is currently taking Prozac, Wellbutrin, and Zyprexa.    Review of Systems  Constitutional:  Positive for fatigue.  Respiratory: Negative.    Cardiovascular: Negative.   Neurological: Negative.   Psychiatric/Behavioral:  Positive for dysphoric mood. Negative for self-injury and suicidal ideas. The patient is nervous/anxious.        Objective:   Physical Exam Constitutional:      Comments: He is somewhat shaky   Cardiovascular:     Rate and Rhythm: Normal rate and regular rhythm.     Pulses: Normal pulses.     Heart sounds: Normal heart  sounds.  Pulmonary:     Effort: Pulmonary effort is normal.     Breath sounds: Normal breath sounds.  Neurological:     General: No focal deficit present.     Mental Status: He is alert and oriented to person, place, and time.  Psychiatric:        Behavior: Behavior normal.        Thought Content: Thought content normal.           Assessment & Plan:  His HTN is not controlled so we will stay on Losartan HCT, but we will stop  the Propranolol. Instead he will try Metoprolol succinate 100 mg daily and Amlodipine 5 mg daily. I urged him to stop drinking alcohol again and he said he will try. We will check a BMET and Mg level today. Recheck in 3-4 weeks.   We spent a total of ( 35  ) minutes reviewing records and discussing these issues.  Alysia Penna, MD

## 2021-10-30 LAB — BASIC METABOLIC PANEL
BUN: 11 mg/dL (ref 6–23)
CO2: 27 mEq/L (ref 19–32)
Calcium: 10.1 mg/dL (ref 8.4–10.5)
Chloride: 103 mEq/L (ref 96–112)
Creatinine, Ser: 0.88 mg/dL (ref 0.40–1.50)
GFR: 103.17 mL/min (ref >60.00)
Glucose, Bld: 81 mg/dL (ref 70–99)
Potassium: 3.8 mEq/L (ref 3.5–5.1)
Sodium: 144 mEq/L (ref 135–145)

## 2021-10-30 LAB — MAGNESIUM: Magnesium: 1.5 mg/dL (ref 1.5–2.5)

## 2021-11-05 DIAGNOSIS — F1020 Alcohol dependence, uncomplicated: Secondary | ICD-10-CM | POA: Diagnosis not present

## 2021-11-05 DIAGNOSIS — F331 Major depressive disorder, recurrent, moderate: Secondary | ICD-10-CM | POA: Diagnosis not present

## 2021-11-10 ENCOUNTER — Telehealth: Payer: Self-pay | Admitting: Family Medicine

## 2021-11-10 NOTE — Telephone Encounter (Signed)
error 

## 2021-11-11 ENCOUNTER — Telehealth: Payer: Self-pay

## 2021-11-11 NOTE — Telephone Encounter (Signed)
--  Caller states the top of his feet (left worst than right) are tender and very sensitive. States the veins are more prevalent than they used to be. Symptoms started 3 days ago. Denies swelling and discoloration. No other symptoms. Does drink a lot.  11/10/2021 2:22:09 [place], RN, Magda Paganini  Comments User: Clint Lipps, RN Date/Time Eilene Ghazi Time): 11/10/2021 2:15:13 PM Rates pain as 2 or 3  User: Clint Lipps, RN Date/Time Eilene Ghazi Time): 11/10/2021 2:20:16 PM Due to patient recent ER visit due to high blood pressure, had him check his BP. It is currently: 107/84. HR 109

## 2021-11-12 ENCOUNTER — Encounter: Payer: Self-pay | Admitting: Family Medicine

## 2021-11-12 ENCOUNTER — Telehealth: Payer: BC Managed Care – PPO | Admitting: Family Medicine

## 2021-11-12 VITALS — BP 128/98

## 2021-11-13 ENCOUNTER — Telehealth (INDEPENDENT_AMBULATORY_CARE_PROVIDER_SITE_OTHER): Payer: BC Managed Care – PPO | Admitting: Family Medicine

## 2021-11-13 ENCOUNTER — Encounter: Payer: Self-pay | Admitting: Family Medicine

## 2021-11-13 DIAGNOSIS — I10 Essential (primary) hypertension: Secondary | ICD-10-CM

## 2021-11-13 MED ORDER — AMLODIPINE BESYLATE 10 MG PO TABS: 10.0000 mg | ORAL_TABLET | Freq: Every day | ORAL | 3 refills | Status: DC

## 2021-11-13 NOTE — Progress Notes (Signed)
This was cancelled  

## 2021-11-13 NOTE — Progress Notes (Signed)
Subjective:    Patient ID: Thomas Williamson, male    DOB: 09-18-1975, 46 y.o.   MRN: [id number]  HPI Virtual Visit via Video Note  I connected with the patient on 11/13/21 at  9:30 AM EDT by a video enabled telemedicine application and verified that I am speaking with the correct person using two identifiers.  Location patient: home Location provider:work or home office Persons participating in the virtual visit: patient, provider  I discussed the limitations of evaluation and management by telemedicine and the availability of in person appointments. The patient expressed understanding and agreed to proceed.   HPI: Here to follow up on HTN. Two weeks ago we changed his BP medications around so thathe is now taking Metoprolol succinate 100 mg and Losartan HCT 100-25 every morning and Amlodipine 5 mg every evening. He feels fine and has no side effects to report. His BP at home is running 120-130/90-100.    ROS: See pertinent positives and negatives per HPI.  Past Medical History:  Diagnosis Date   Alcohol use disorder, severe, in early remission ([place]) 03/30/2018   Allergy    Anxiety    Bipolar disorder, in partial remission, most recent episode hypomanic ([place]) 03/30/2018   Bleeding internal hemorrhoids 09/12/2013   Cannabis dependence ([place]) 07/13/2018   Daily use   Cervical disc disease 11/20/2014   Chronic anxiety 2/63/7858   Complication of anesthesia    Depression    GERD (gastroesophageal reflux disease)    Hernia, inguinal, bilateral 02/24/2011   High ankle sprain of right lower extremity 01/16/2015   History of hiatal hernia    Hx of hepatitis C 10/05/2017   -treated in 2019 -hepatology recommended no further surveillance needed except for LFTs with labs and see hepatologist if elevated   Hypertension    Lipids abnormal 09/29/2013   Pneumonia    PONV (postoperative nausea and vomiting)     Past Surgical History:  Procedure Laterality Date   ANTERIOR CRUCIATE LIGAMENT REPAIR   2010   BIOPSY  03/13/2019   Procedure: BIOPSY;  Surgeon: Yetta Flock, MD;  Location: [facility];  Service: Gastroenterology;;   COLONOSCOPY N/A 03/13/2019   per Dr. Havery Moros, adenomatous polyps, repeat in 3 yrs   [place]  02/23/2012   Procedure: LAPAROSCOPIC BILATERAL INGUINAL HERNIA REPAIR;  Surgeon: Shann Medal, MD;  Location: [facility];  Service: General;  Laterality: Bilateral;  Laparoscopic Bilateral Inguinal Hernia Repair with mesh   INSERTION OF MESH  02/23/2012   Procedure: INSERTION OF MESH;  Surgeon: Shann Medal, MD;  Location: [facility];  Service: General;  Laterality: N/A;   NOSE SURGERY  [id number]   POLYPECTOMY  03/13/2019   Procedure: POLYPECTOMY;  Surgeon: Yetta Flock, MD;  Location: [facility];  Service: Gastroenterology;;    Family History  Problem Relation Age of Onset   Hypertension Mother    Atrial fibrillation Mother    Cancer Father    Hypertension Father    Leukemia Father 90   Prostate cancer Father 34   Diabetes Father    Diabetes Brother    Other Brother        growth on finger   Learning disabilities Paternal Aunt    Lung cancer Maternal Grandmother    Heart attack Maternal Grandfather    Congestive Heart Failure Paternal Grandmother    Colon cancer Neg Hx      Current Outpatient Medications:    buPROPion (WELLBUTRIN XL) 300 MG 24 hr tablet, Take  1 tablet (300 mg total) by mouth daily., Disp: 90 tablet, Rfl: 0   losartan-hydrochlorothiazide (HYZAAR) 100-25 MG tablet, Take 1 tablet by mouth daily., Disp: 90 tablet, Rfl: 3   metoprolol succinate (TOPROL-XL) 100 MG 24 hr tablet, Take 1 tablet (100 mg total) by mouth daily., Disp: 30 tablet, Rfl: 3   Omeprazole 20 MG TBEC, Take 20 mg by mouth daily. , Disp: , Rfl:    pravastatin (PRAVACHOL) 40 MG tablet, Take 1 tablet (40 mg total) by mouth daily., Disp: 90 tablet, Rfl: 3   valACYclovir (VALTREX) 1000 MG tablet, Take 1 tablet (1,000 mg total) by mouth 2 (two) times  daily as needed (fever blisters)., Disp: 60 tablet, Rfl: 5   amLODipine (NORVASC) 10 MG tablet, Take 1 tablet (10 mg total) by mouth daily., Disp: 30 tablet, Rfl: 3   OLANZapine (ZYPREXA) 5 MG tablet, Take 1 tablet (5 mg total) by mouth daily as needed (anxiety)., Disp: 90 tablet, Rfl: 0  EXAM:  VITALS per patient if applicable:  GENERAL: alert, oriented, appears well and in no acute distress  HEENT: atraumatic, conjunttiva clear, no obvious abnormalities on inspection of external nose and ears  NECK: normal movements of the head and neck  LUNGS: on inspection no signs of respiratory distress, breathing rate appears normal, no obvious gross SOB, gasping or wheezing  CV: no obvious cyanosis  MS: moves all visible extremities without noticeable abnormality  PSYCH/NEURO: pleasant and cooperative, no obvious depression or anxiety, speech and thought processing grossly intact  ASSESSMENT AND PLAN: We are getting the HTN under better control, but we will increase the Amlodipine to 10 mg daily. Recheck in 2 weeks. Alysia Penna, MD   Discussed the following assessment and plan:  No diagnosis found.     I discussed the assessment and treatment plan with the patient. The patient was provided an opportunity to ask questions and all were answered. The patient agreed with the plan and demonstrated an understanding of the instructions.   The patient was advised to call back or seek an in-person evaluation if the symptoms worsen or if the condition fails to improve as anticipated.      Review of Systems     Objective:   Physical Exam        Assessment & Plan:

## 2021-11-25 DIAGNOSIS — Z6822 Body mass index (BMI) 22.0-22.9, adult: Secondary | ICD-10-CM | POA: Diagnosis not present

## 2021-11-25 DIAGNOSIS — F10239 Alcohol dependence with withdrawal, unspecified: Secondary | ICD-10-CM | POA: Diagnosis not present

## 2021-11-25 DIAGNOSIS — I447 Left bundle-branch block, unspecified: Secondary | ICD-10-CM | POA: Diagnosis not present

## 2021-11-25 DIAGNOSIS — I161 Hypertensive emergency: Secondary | ICD-10-CM | POA: Diagnosis not present

## 2021-11-25 DIAGNOSIS — E876 Hypokalemia: Secondary | ICD-10-CM | POA: Diagnosis not present

## 2021-11-25 DIAGNOSIS — F10930 Alcohol use, unspecified with withdrawal, uncomplicated: Secondary | ICD-10-CM | POA: Diagnosis not present

## 2021-11-25 DIAGNOSIS — F1220 Cannabis dependence, uncomplicated: Secondary | ICD-10-CM | POA: Diagnosis not present

## 2021-11-25 DIAGNOSIS — F10939 Alcohol use, unspecified with withdrawal, unspecified: Secondary | ICD-10-CM | POA: Diagnosis not present

## 2021-11-25 DIAGNOSIS — D72829 Elevated white blood cell count, unspecified: Secondary | ICD-10-CM | POA: Diagnosis not present

## 2021-11-25 DIAGNOSIS — F319 Bipolar disorder, unspecified: Secondary | ICD-10-CM | POA: Diagnosis not present

## 2021-11-25 DIAGNOSIS — F314 Bipolar disorder, current episode depressed, severe, without psychotic features: Secondary | ICD-10-CM | POA: Diagnosis not present

## 2021-11-25 DIAGNOSIS — N179 Acute kidney failure, unspecified: Secondary | ICD-10-CM | POA: Diagnosis not present

## 2021-11-25 DIAGNOSIS — I426 Alcoholic cardiomyopathy: Secondary | ICD-10-CM | POA: Diagnosis not present

## 2021-11-25 DIAGNOSIS — E785 Hyperlipidemia, unspecified: Secondary | ICD-10-CM | POA: Diagnosis not present

## 2021-11-25 DIAGNOSIS — I10 Essential (primary) hypertension: Secondary | ICD-10-CM | POA: Diagnosis not present

## 2021-11-25 DIAGNOSIS — E871 Hypo-osmolality and hyponatremia: Secondary | ICD-10-CM | POA: Diagnosis not present

## 2021-11-25 DIAGNOSIS — E872 Acidosis, unspecified: Secondary | ICD-10-CM | POA: Diagnosis not present

## 2021-11-25 DIAGNOSIS — K859 Acute pancreatitis without necrosis or infection, unspecified: Secondary | ICD-10-CM | POA: Diagnosis not present

## 2021-11-25 DIAGNOSIS — Z20822 Contact with and (suspected) exposure to covid-19: Secondary | ICD-10-CM | POA: Diagnosis not present

## 2021-11-25 DIAGNOSIS — K852 Alcohol induced acute pancreatitis without necrosis or infection: Secondary | ICD-10-CM | POA: Diagnosis not present

## 2021-12-03 DIAGNOSIS — F331 Major depressive disorder, recurrent, moderate: Secondary | ICD-10-CM | POA: Diagnosis not present

## 2021-12-03 DIAGNOSIS — F1020 Alcohol dependence, uncomplicated: Secondary | ICD-10-CM | POA: Diagnosis not present

## 2021-12-05 ENCOUNTER — Encounter: Payer: Self-pay | Admitting: Family Medicine

## 2021-12-05 ENCOUNTER — Ambulatory Visit (INDEPENDENT_AMBULATORY_CARE_PROVIDER_SITE_OTHER): Payer: BC Managed Care – PPO | Admitting: Family Medicine

## 2021-12-05 VITALS — BP 100/74 | HR 86 | Temp 98.4°F | Wt 160.0 lb

## 2021-12-05 DIAGNOSIS — N179 Acute kidney failure, unspecified: Secondary | ICD-10-CM

## 2021-12-05 DIAGNOSIS — K852 Alcohol induced acute pancreatitis without necrosis or infection: Secondary | ICD-10-CM | POA: Insufficient documentation

## 2021-12-05 DIAGNOSIS — K860 Alcohol-induced chronic pancreatitis: Secondary | ICD-10-CM | POA: Insufficient documentation

## 2021-12-05 DIAGNOSIS — I10 Essential (primary) hypertension: Secondary | ICD-10-CM

## 2021-12-05 DIAGNOSIS — F3176 Bipolar disorder, in full remission, most recent episode depressed: Secondary | ICD-10-CM

## 2021-12-05 DIAGNOSIS — Z23 Encounter for immunization: Secondary | ICD-10-CM

## 2021-12-05 DIAGNOSIS — F1021 Alcohol dependence, in remission: Secondary | ICD-10-CM | POA: Diagnosis not present

## 2021-12-05 NOTE — Progress Notes (Signed)
   Subjective:    Patient ID: Thomas Williamson, male    DOB: [birth year], 46 y.o.   MRN: [id number]  HPI Here to follow up a hospital stay at the [facility] in [place] from 11-25-21 to 12-02-21. He is a recovering alcoholic and after being off alcohol for a long time, he began drinking heavily again several weeks ago. This was triggered when he suddenly lost his job. For several days prior to the admission he became quite weak, he had abdominal cramps, and he had non-stop vomiting. No fever. On admission his creatinine was 4.6, potassium was 3.0, magnesium was 1.4, and WBC was 17.3. He was treated with IV fluids and his electrolytes were corrected. He was kept NPO for several days and then thin liquids were re-introduced. He was given Librium to treat for alcohol withdrawal symptoms. After a few days his lipase was found to be 540 and a CT scan revealed inflammation around the pancreas consistent with pancreatitis. This slowly improved each day he was there. The creatinine came down to 1.04, the potassium to 4.2, the magnesium to 2.0, and the WBC to 10.9. Finally he was sent home to slowly advance his diet as tolerated. He was given a taper of Librium to start at 10 mg TID and to be completed in 21 days. Since going home he has done fairly well. He has stayed off alcohol, and he is tolerating soft and bland foods. His BM's are back to normal. He continues to see his therapist weekly and he has attended a few [place] meetings.    Review of Systems  Constitutional: Negative.   Respiratory: Negative.    Cardiovascular: Negative.   Gastrointestinal: Negative.   Genitourinary: Negative.   Psychiatric/Behavioral: Negative.         Objective:   Physical Exam Constitutional:      Appearance: Normal appearance. He is not ill-appearing.  Cardiovascular:     Rate and Rhythm: Normal rate and regular rhythm.     Pulses: Normal pulses.     Heart sounds: Normal heart sounds.  Pulmonary:     Effort:  Pulmonary effort is normal.     Breath sounds: Normal breath sounds.  Abdominal:     General: Abdomen is flat. Bowel sounds are normal. There is no distension.     Palpations: Abdomen is soft. There is no mass.     Tenderness: There is no abdominal tenderness. There is no guarding or rebound.     Hernia: No hernia is present.  Neurological:     Mental Status: He is alert.  Psychiatric:        Behavior: Behavior normal.        Thought Content: Thought content normal.     Comments: Affect is mildly depressed            Assessment & Plan:  He is recovering from a case of pancreatitis, AKI, and alcohol withdrawal. He will finish the Librium taper. He plans to stay off alcohol. He will slowly advance his diet as tolerated. His HTN is stable. He is already looking for a new job. We will plan to see him again in 4 weeks. We will recheck renal function, electrolytes, etc at that time. We spent a total of ( 35  ) minutes reviewing records and discussing these issues.  Alysia Penna, MD

## 2021-12-10 DIAGNOSIS — F1020 Alcohol dependence, uncomplicated: Secondary | ICD-10-CM | POA: Diagnosis not present

## 2021-12-10 DIAGNOSIS — F331 Major depressive disorder, recurrent, moderate: Secondary | ICD-10-CM | POA: Diagnosis not present

## 2021-12-17 DIAGNOSIS — F331 Major depressive disorder, recurrent, moderate: Secondary | ICD-10-CM | POA: Diagnosis not present

## 2021-12-17 DIAGNOSIS — F1020 Alcohol dependence, uncomplicated: Secondary | ICD-10-CM | POA: Diagnosis not present

## 2021-12-18 DIAGNOSIS — F331 Major depressive disorder, recurrent, moderate: Secondary | ICD-10-CM | POA: Diagnosis not present

## 2021-12-18 DIAGNOSIS — F411 Generalized anxiety disorder: Secondary | ICD-10-CM | POA: Diagnosis not present

## 2022-01-05 ENCOUNTER — Ambulatory Visit (INDEPENDENT_AMBULATORY_CARE_PROVIDER_SITE_OTHER): Payer: BC Managed Care – PPO | Admitting: Family Medicine

## 2022-01-05 ENCOUNTER — Encounter: Payer: Self-pay | Admitting: Family Medicine

## 2022-01-05 VITALS — BP 110/80 | HR 72 | Temp 98.3°F | Wt 169.0 lb

## 2022-01-05 DIAGNOSIS — I10 Essential (primary) hypertension: Secondary | ICD-10-CM

## 2022-01-05 DIAGNOSIS — F3176 Bipolar disorder, in full remission, most recent episode depressed: Secondary | ICD-10-CM | POA: Diagnosis not present

## 2022-01-05 DIAGNOSIS — F1010 Alcohol abuse, uncomplicated: Secondary | ICD-10-CM

## 2022-01-05 DIAGNOSIS — K852 Alcohol induced acute pancreatitis without necrosis or infection: Secondary | ICD-10-CM

## 2022-01-05 DIAGNOSIS — F1020 Alcohol dependence, uncomplicated: Secondary | ICD-10-CM | POA: Insufficient documentation

## 2022-01-05 DIAGNOSIS — E8729 Other acidosis: Secondary | ICD-10-CM

## 2022-01-05 LAB — BASIC METABOLIC PANEL
BUN: 12 mg/dL (ref 6–23)
CO2: 27 mEq/L (ref 19–32)
Calcium: 9.9 mg/dL (ref 8.4–10.5)
Chloride: 103 mEq/L (ref 96–112)
Creatinine, Ser: 0.84 mg/dL (ref 0.40–1.50)
GFR: 104.49 mL/min (ref >60.00)
Glucose, Bld: 93 mg/dL (ref 70–99)
Potassium: 3.4 mEq/L — ABNORMAL LOW (ref 3.5–5.1)
Sodium: 140 mEq/L (ref 135–145)

## 2022-01-05 LAB — LIPASE: Lipase: 48 U/L (ref 11.0–59.0)

## 2022-01-05 NOTE — Progress Notes (Signed)
   Subjective:    Patient ID: Thomas Williamson, male    DOB: [birth year], 46 y.o.   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 Since we saw him a month ago he admits to drinking alcohol on 2 occasions. He stays in touch with his therapist, and he spoke to [employer] as well. He has been to 7 AA meetings since then. Overall he feels well. He has advanced his diet to regular, and he has gained a few pounds. He sleeps well.    Review of Systems  Constitutional: Negative.   Respiratory: Negative.    Cardiovascular: Negative.   Gastrointestinal: Negative.   Neurological: Negative.   Psychiatric/Behavioral: Negative.         Objective:   Physical Exam Constitutional:      Appearance: Normal appearance. He is not ill-appearing.  Cardiovascular:     Rate and Rhythm: Normal rate and regular rhythm.     Pulses: Normal pulses.     Heart sounds: Normal heart sounds.  Pulmonary:     Effort: Pulmonary effort is normal.     Breath sounds: Normal breath sounds.  Neurological:     Mental Status: He is alert and oriented to person, place, and time. Mental status is at baseline.  Psychiatric:        Mood and Affect: Mood normal.        Behavior: Behavior normal.        Thought Content: Thought content normal.           Assessment & Plan:  He is recovering from pancreatitis and he is trying to stay sober. We will recheck a lipase and a BMET today. His HTN is stable. I encouraged him to stay off alcohol and to reach out for help when he is tempted to drink. We spent a total of ( 33  ) minutes reviewing records and discussing these issues.  Alysia Penna, MD  Alysia Penna, MD

## 2022-01-06 ENCOUNTER — Telehealth: Payer: Self-pay | Admitting: Family Medicine

## 2022-01-06 NOTE — Telephone Encounter (Signed)
Pt is calling and has viewed his results on mychart and just want confirmation that the labs or ok

## 2022-01-06 NOTE — Telephone Encounter (Signed)
Awaiting reply from Dr. Sarajane Jews concerning results.

## 2022-01-07 ENCOUNTER — Other Ambulatory Visit: Payer: Self-pay

## 2022-01-07 DIAGNOSIS — F1020 Alcohol dependence, uncomplicated: Secondary | ICD-10-CM | POA: Diagnosis not present

## 2022-01-07 DIAGNOSIS — F331 Major depressive disorder, recurrent, moderate: Secondary | ICD-10-CM | POA: Diagnosis not present

## 2022-01-07 DIAGNOSIS — E876 Hypokalemia: Secondary | ICD-10-CM

## 2022-01-07 MED ORDER — POTASSIUM CHLORIDE CRYS ER 10 MEQ PO TBCR: 10.0000 meq | EXTENDED_RELEASE_TABLET | Freq: Every day | ORAL | 3 refills | Status: DC

## 2022-01-07 NOTE — Telephone Encounter (Signed)
Spoke with patient concerning lab results.

## 2022-01-08 ENCOUNTER — Encounter: Payer: Self-pay | Admitting: *Deleted

## 2022-01-08 ENCOUNTER — Telehealth: Payer: Self-pay | Admitting: *Deleted

## 2022-01-08 NOTE — Patient Instructions (Signed)
Visit Information  Thank you for taking time to visit with me today. Please don't hesitate to contact me if I can be of assistance to you.   Following are the goals we discussed today:   Goals Addressed               This Visit's Progress     COMPLETED: No needs (pt-stated)        Care Coordination Interventions: Reviewed medications with patient and discussed adherence with no needed refills Reviewed scheduled/upcoming provider appointments including sufficient transportation source Screening for signs and symptoms of depression related to chronic disease state  Assessed social determinant of health barriers         Please call the care guide team at [phone] if you need to cancel or reschedule your appointment.   If you are experiencing a Mental Health or [place] or need someone to talk to, please call the Suicide and Crisis Lifeline: 988 call the [place] National Suicide Prevention Lifeline: 3461846992 or TTY: 509-109-3962 TTY 717 845 4994) to talk to a trained counselor call 1-800-273-TALK (toll free, 24 hour hotline)  Patient verbalizes understanding of instructions and care plan provided today and agrees to view in [place]. Active MyChart status and patient understanding of how to access instructions and care plan via MyChart confirmed with patient.     No further follow up required: No further needs   Raina Mina, RN Care Management Coordinator [place] Office [phone]

## 2022-01-08 NOTE — Patient Outreach (Signed)
  Care Coordination   Initial Visit Note   01/08/2022 Name: Thomas Williamson MRN: [id number] DOB: [birth year]  Thomas Williamson is a 46 y.o. year old male who sees Thomas Morale, MD for primary care. I spoke with  Thomas Williamson by phone today.  What matters to the patients health and wellness today?  No needs    Goals Addressed               This Visit's Progress     COMPLETED: No needs (pt-stated)        Care Coordination Interventions: Reviewed medications with patient and discussed adherence with no needed refills Reviewed scheduled/upcoming provider appointments including sufficient transportation source Screening for signs and symptoms of depression related to chronic disease state  Assessed social determinant of health barriers         SDOH assessments and interventions completed:  Yes  SDOH Interventions Today    Flowsheet Row Most Recent Value  SDOH Interventions   Food Insecurity Interventions Intervention Not Indicated  Housing Interventions Intervention Not Indicated  Transportation Interventions Intervention Not Indicated  Utilities Interventions Intervention Not Indicated        Care Coordination Interventions:  Yes, provided   Follow up plan: No further intervention required.   Encounter Outcome:  Pt. Visit Completed   Thomas Mina, RN Care Management Coordinator [place] Office [phone]

## 2022-01-11 ENCOUNTER — Other Ambulatory Visit: Payer: Self-pay | Admitting: Family Medicine

## 2022-01-15 DIAGNOSIS — F331 Major depressive disorder, recurrent, moderate: Secondary | ICD-10-CM | POA: Diagnosis not present

## 2022-01-15 DIAGNOSIS — F411 Generalized anxiety disorder: Secondary | ICD-10-CM | POA: Diagnosis not present

## 2022-02-01 DIAGNOSIS — E872 Acidosis, unspecified: Secondary | ICD-10-CM | POA: Diagnosis not present

## 2022-02-01 DIAGNOSIS — F10239 Alcohol dependence with withdrawal, unspecified: Secondary | ICD-10-CM | POA: Diagnosis not present

## 2022-02-01 DIAGNOSIS — I5042 Chronic combined systolic (congestive) and diastolic (congestive) heart failure: Secondary | ICD-10-CM | POA: Diagnosis not present

## 2022-02-01 DIAGNOSIS — F10229 Alcohol dependence with intoxication, unspecified: Secondary | ICD-10-CM | POA: Diagnosis not present

## 2022-02-01 DIAGNOSIS — K219 Gastro-esophageal reflux disease without esophagitis: Secondary | ICD-10-CM | POA: Diagnosis not present

## 2022-02-01 DIAGNOSIS — Z20822 Contact with and (suspected) exposure to covid-19: Secondary | ICD-10-CM | POA: Diagnosis not present

## 2022-02-01 DIAGNOSIS — F10129 Alcohol abuse with intoxication, unspecified: Secondary | ICD-10-CM | POA: Diagnosis not present

## 2022-02-01 DIAGNOSIS — E785 Hyperlipidemia, unspecified: Secondary | ICD-10-CM | POA: Diagnosis not present

## 2022-02-01 DIAGNOSIS — E8729 Other acidosis: Secondary | ICD-10-CM | POA: Diagnosis not present

## 2022-02-01 DIAGNOSIS — F10988 Alcohol use, unspecified with other alcohol-induced disorder: Secondary | ICD-10-CM | POA: Diagnosis not present

## 2022-02-01 DIAGNOSIS — R0602 Shortness of breath: Secondary | ICD-10-CM | POA: Diagnosis not present

## 2022-02-01 DIAGNOSIS — F10930 Alcohol use, unspecified with withdrawal, uncomplicated: Secondary | ICD-10-CM | POA: Diagnosis not present

## 2022-02-01 DIAGNOSIS — I426 Alcoholic cardiomyopathy: Secondary | ICD-10-CM | POA: Diagnosis not present

## 2022-02-01 DIAGNOSIS — X58XXXA Exposure to other specified factors, initial encounter: Secondary | ICD-10-CM | POA: Diagnosis not present

## 2022-02-01 DIAGNOSIS — R55 Syncope and collapse: Secondary | ICD-10-CM | POA: Diagnosis not present

## 2022-02-01 DIAGNOSIS — F10939 Alcohol use, unspecified with withdrawal, unspecified: Secondary | ICD-10-CM | POA: Diagnosis not present

## 2022-02-01 DIAGNOSIS — E162 Hypoglycemia, unspecified: Secondary | ICD-10-CM | POA: Diagnosis not present

## 2022-02-01 DIAGNOSIS — F410 Panic disorder [episodic paroxysmal anxiety] without agoraphobia: Secondary | ICD-10-CM | POA: Diagnosis not present

## 2022-02-01 DIAGNOSIS — Y907 Blood alcohol level of 200-239 mg/100 ml: Secondary | ICD-10-CM | POA: Diagnosis not present

## 2022-02-01 DIAGNOSIS — E161 Other hypoglycemia: Secondary | ICD-10-CM | POA: Diagnosis not present

## 2022-02-01 DIAGNOSIS — T68XXXA Hypothermia, initial encounter: Secondary | ICD-10-CM | POA: Diagnosis not present

## 2022-02-01 DIAGNOSIS — I429 Cardiomyopathy, unspecified: Secondary | ICD-10-CM | POA: Diagnosis not present

## 2022-02-01 DIAGNOSIS — F1021 Alcohol dependence, in remission: Secondary | ICD-10-CM | POA: Diagnosis not present

## 2022-02-01 DIAGNOSIS — I110 Hypertensive heart disease with heart failure: Secondary | ICD-10-CM | POA: Diagnosis not present

## 2022-02-01 DIAGNOSIS — F319 Bipolar disorder, unspecified: Secondary | ICD-10-CM | POA: Diagnosis not present

## 2022-02-11 DIAGNOSIS — F339 Major depressive disorder, recurrent, unspecified: Secondary | ICD-10-CM | POA: Diagnosis not present

## 2022-02-11 DIAGNOSIS — F1020 Alcohol dependence, uncomplicated: Secondary | ICD-10-CM | POA: Diagnosis not present

## 2022-02-11 DIAGNOSIS — F419 Anxiety disorder, unspecified: Secondary | ICD-10-CM | POA: Diagnosis not present

## 2022-02-12 DIAGNOSIS — F419 Anxiety disorder, unspecified: Secondary | ICD-10-CM | POA: Diagnosis not present

## 2022-02-12 DIAGNOSIS — F339 Major depressive disorder, recurrent, unspecified: Secondary | ICD-10-CM | POA: Diagnosis not present

## 2022-02-12 DIAGNOSIS — F1020 Alcohol dependence, uncomplicated: Secondary | ICD-10-CM | POA: Diagnosis not present

## 2022-02-13 ENCOUNTER — Encounter: Payer: Self-pay | Admitting: Family Medicine

## 2022-02-13 ENCOUNTER — Ambulatory Visit (INDEPENDENT_AMBULATORY_CARE_PROVIDER_SITE_OTHER): Payer: BC Managed Care – PPO | Admitting: Family Medicine

## 2022-02-13 VITALS — BP 108/76 | HR 88 | Temp 98.1°F | Ht 71.0 in | Wt 175.0 lb

## 2022-02-13 DIAGNOSIS — F411 Generalized anxiety disorder: Secondary | ICD-10-CM | POA: Diagnosis not present

## 2022-02-13 DIAGNOSIS — I426 Alcoholic cardiomyopathy: Secondary | ICD-10-CM

## 2022-02-13 DIAGNOSIS — F3176 Bipolar disorder, in full remission, most recent episode depressed: Secondary | ICD-10-CM

## 2022-02-13 DIAGNOSIS — F1010 Alcohol abuse, uncomplicated: Secondary | ICD-10-CM

## 2022-02-13 DIAGNOSIS — F331 Major depressive disorder, recurrent, moderate: Secondary | ICD-10-CM | POA: Diagnosis not present

## 2022-02-13 DIAGNOSIS — I10 Essential (primary) hypertension: Secondary | ICD-10-CM

## 2022-02-13 DIAGNOSIS — E8729 Other acidosis: Secondary | ICD-10-CM | POA: Diagnosis not present

## 2022-02-13 DIAGNOSIS — F1029 Alcohol dependence with unspecified alcohol-induced disorder: Secondary | ICD-10-CM | POA: Diagnosis not present

## 2022-02-13 LAB — BASIC METABOLIC PANEL
BUN: 16 mg/dL (ref 6–23)
CO2: 26 mEq/L (ref 19–32)
Calcium: 10.4 mg/dL (ref 8.4–10.5)
Chloride: 99 mEq/L (ref 96–112)
Creatinine, Ser: 1.08 mg/dL (ref 0.40–1.50)
GFR: 82.17 mL/min (ref >60.00)
Glucose, Bld: 92 mg/dL (ref 70–99)
Potassium: 4.6 mEq/L (ref 3.5–5.1)
Sodium: 137 mEq/L (ref 135–145)

## 2022-02-13 LAB — MAGNESIUM: Magnesium: 2.1 mg/dL (ref 1.5–2.5)

## 2022-02-13 LAB — FOLATE: Folate: >23.8 ng/mL (ref >5.9)

## 2022-02-13 MED ORDER — METOPROLOL SUCCINATE ER 25 MG PO TB24: 25.0000 mg | ORAL_TABLET | Freq: Every day | ORAL | 5 refills | Status: DC

## 2022-02-13 NOTE — Progress Notes (Signed)
   Subjective:    Patient ID: Thomas Williamson, male    DOB: 1975/12/02, 47 y.o.   MRN: [id number]  HPI Here for a transitional care visit to follow up a hospital stay at the [facility] facility in [place] from 02-01-22 to 02-05-22. He presented with lightheadedness, sweats, and SOB. It was determined that he was withdrawing from heavy alcohol use, and this likely triggered an anxiety attack. He was treated with IV fluids, thiamine, folate, and a Librium taper. He was sent home with the following diagnoses: alcoholic withdrawal, Bipolar 1 disorder, alcoholic cardiomyopathy, hypokalemia, hypomagnesemia, and dehydration. His renal function remained normal. The potassium went from 3.4 to 4.0, and the magnesium from 1.6 to 2.1. an ECHO showed his EF to be stable at 35%. He admitted to drinking 1/2 of a fifth of whiskey every day for a week prior to this. He was sent home on a 2 day taper of Librium. He was offered admission to an inpatient rehab program, but he declined. Instead he agreed to attending an intensive outpatient program (or IOP) at [facility] near [place], and he attends programs 8 hours a day on Mondays, Wednesdays, and Thursdays. Today he feels back to normal. He has remained sober since DC.    Review of Systems  Constitutional: Negative.   Respiratory: Negative.    Cardiovascular: Negative.   Neurological: Negative.   Psychiatric/Behavioral: Negative.         Objective:   Physical Exam Constitutional:      Appearance: Normal appearance. He is not ill-appearing.  Cardiovascular:     Rate and Rhythm: Normal rate and regular rhythm.     Pulses: Normal pulses.     Heart sounds: Normal heart sounds.  Pulmonary:     Effort: Pulmonary effort is normal.     Breath sounds: Normal breath sounds.  Neurological:     General: No focal deficit present.     Mental Status: He is alert and oriented to person, place, and time.  Psychiatric:        Behavior: Behavior normal.        Thought  Content: Thought content normal.     Comments: Somewhat anxious           Assessment & Plan:  He is recovering from an alcohol induced ketoacidosis and an alcohol withdrawal anxiety attack. He has remained sober, and he is attending an IOP. We will get labs today to check potassium, magnesium, thiamine, and folate. He is scheduled to see his psychiatrist, Dr. Arlyn Leak, today at 3:00.  We spent a total of ( 35  ) minutes reviewing records and discussing these issues.  Alysia Penna, MD

## 2022-02-16 DIAGNOSIS — F419 Anxiety disorder, unspecified: Secondary | ICD-10-CM | POA: Diagnosis not present

## 2022-02-16 DIAGNOSIS — F339 Major depressive disorder, recurrent, unspecified: Secondary | ICD-10-CM | POA: Diagnosis not present

## 2022-02-16 DIAGNOSIS — F1020 Alcohol dependence, uncomplicated: Secondary | ICD-10-CM | POA: Diagnosis not present

## 2022-02-17 LAB — VITAMIN B1: Vitamin B1 (Thiamine): 15 nmol/L (ref 8–30)

## 2022-02-18 ENCOUNTER — Telehealth: Payer: Self-pay | Admitting: Family Medicine

## 2022-02-18 ENCOUNTER — Other Ambulatory Visit: Payer: Self-pay

## 2022-02-18 DIAGNOSIS — E876 Hypokalemia: Secondary | ICD-10-CM

## 2022-02-18 DIAGNOSIS — F1020 Alcohol dependence, uncomplicated: Secondary | ICD-10-CM | POA: Diagnosis not present

## 2022-02-18 DIAGNOSIS — F419 Anxiety disorder, unspecified: Secondary | ICD-10-CM | POA: Diagnosis not present

## 2022-02-18 DIAGNOSIS — F339 Major depressive disorder, recurrent, unspecified: Secondary | ICD-10-CM | POA: Diagnosis not present

## 2022-02-18 MED ORDER — MAGNESIUM OXIDE 400 MG PO CAPS: 400.0000 mg | ORAL_CAPSULE | Freq: Two times a day (BID) | ORAL | 3 refills | Status: DC

## 2022-02-18 NOTE — Telephone Encounter (Signed)
Pt seeking guidance as to whether he should continue taking the magnesium oxide (MAG-OX) 400 (240 Mg) MG tablet   that was prescribed by another provider while he was in a hospital elsewhere.

## 2022-02-19 ENCOUNTER — Other Ambulatory Visit: Payer: Self-pay

## 2022-02-19 DIAGNOSIS — F339 Major depressive disorder, recurrent, unspecified: Secondary | ICD-10-CM | POA: Diagnosis not present

## 2022-02-19 DIAGNOSIS — F419 Anxiety disorder, unspecified: Secondary | ICD-10-CM | POA: Diagnosis not present

## 2022-02-19 DIAGNOSIS — F1020 Alcohol dependence, uncomplicated: Secondary | ICD-10-CM | POA: Diagnosis not present

## 2022-02-19 MED ORDER — FOLIC ACID 1 MG PO TABS: 1.0000 mg | ORAL_TABLET | Freq: Every day | ORAL | 0 refills | Status: DC

## 2022-02-19 MED ORDER — THIAMINE MONONITRATE 100 MG PO TABS: 100.0000 mg | ORAL_TABLET | Freq: Every day | ORAL | 3 refills | Status: DC

## 2022-02-19 MED ORDER — THIAMINE MONONITRATE 100 MG PO TABS: 100.0000 mg | ORAL_TABLET | Freq: Every day | ORAL | 0 refills | Status: DC

## 2022-02-19 MED ORDER — FOLIC ACID 1 MG PO TABS: 1.0000 mg | ORAL_TABLET | Freq: Every day | ORAL | 3 refills | Status: AC

## 2022-02-19 NOTE — Telephone Encounter (Signed)
Called patient prescriptions for Folic Acid, and Vitamin B1 sent to [facility] in [facility]

## 2022-02-19 NOTE — Telephone Encounter (Signed)
Spoke with patient, per Dr. Sarajane Jews refills for Magnesium 400 mg [facility] in [facility].      Patient asked should he continue with Folic Acid '1mg'$ ,  and Vitamin B1?     Please advise if okay to send refills

## 2022-02-19 NOTE — Addendum Note (Signed)
Addended by: Otilio Miu on: 02/19/2022 02:00 PM   Modules accepted: Orders

## 2022-02-19 NOTE — Telephone Encounter (Signed)
Yes please refill both of these for one year

## 2022-02-23 ENCOUNTER — Other Ambulatory Visit: Payer: Self-pay

## 2022-02-23 MED ORDER — METOPROLOL SUCCINATE ER 25 MG PO TB24: 25.0000 mg | ORAL_TABLET | Freq: Every day | ORAL | 5 refills | Status: DC

## 2022-02-25 DIAGNOSIS — F1020 Alcohol dependence, uncomplicated: Secondary | ICD-10-CM | POA: Diagnosis not present

## 2022-02-25 DIAGNOSIS — F331 Major depressive disorder, recurrent, moderate: Secondary | ICD-10-CM | POA: Diagnosis not present

## 2022-02-26 DIAGNOSIS — F419 Anxiety disorder, unspecified: Secondary | ICD-10-CM | POA: Diagnosis not present

## 2022-02-26 DIAGNOSIS — F1020 Alcohol dependence, uncomplicated: Secondary | ICD-10-CM | POA: Diagnosis not present

## 2022-02-26 DIAGNOSIS — F339 Major depressive disorder, recurrent, unspecified: Secondary | ICD-10-CM | POA: Diagnosis not present

## 2022-03-02 DIAGNOSIS — F339 Major depressive disorder, recurrent, unspecified: Secondary | ICD-10-CM | POA: Diagnosis not present

## 2022-03-02 DIAGNOSIS — F419 Anxiety disorder, unspecified: Secondary | ICD-10-CM | POA: Diagnosis not present

## 2022-03-02 DIAGNOSIS — F1020 Alcohol dependence, uncomplicated: Secondary | ICD-10-CM | POA: Diagnosis not present

## 2022-03-08 DIAGNOSIS — F10230 Alcohol dependence with withdrawal, uncomplicated: Secondary | ICD-10-CM | POA: Diagnosis not present

## 2022-03-08 DIAGNOSIS — I426 Alcoholic cardiomyopathy: Secondary | ICD-10-CM | POA: Diagnosis not present

## 2022-03-08 DIAGNOSIS — K449 Diaphragmatic hernia without obstruction or gangrene: Secondary | ICD-10-CM | POA: Diagnosis not present

## 2022-03-08 DIAGNOSIS — R112 Nausea with vomiting, unspecified: Secondary | ICD-10-CM | POA: Diagnosis not present

## 2022-03-08 DIAGNOSIS — E8729 Other acidosis: Secondary | ICD-10-CM | POA: Diagnosis not present

## 2022-03-08 DIAGNOSIS — R251 Tremor, unspecified: Secondary | ICD-10-CM | POA: Diagnosis not present

## 2022-03-08 DIAGNOSIS — E860 Dehydration: Secondary | ICD-10-CM | POA: Diagnosis not present

## 2022-03-08 DIAGNOSIS — S0990XA Unspecified injury of head, initial encounter: Secondary | ICD-10-CM | POA: Diagnosis not present

## 2022-03-08 DIAGNOSIS — W19XXXA Unspecified fall, initial encounter: Secondary | ICD-10-CM | POA: Diagnosis not present

## 2022-03-08 DIAGNOSIS — F3176 Bipolar disorder, in full remission, most recent episode depressed: Secondary | ICD-10-CM | POA: Diagnosis not present

## 2022-03-08 DIAGNOSIS — I110 Hypertensive heart disease with heart failure: Secondary | ICD-10-CM | POA: Diagnosis not present

## 2022-03-08 DIAGNOSIS — D72829 Elevated white blood cell count, unspecified: Secondary | ICD-10-CM | POA: Diagnosis not present

## 2022-03-08 DIAGNOSIS — E872 Acidosis, unspecified: Secondary | ICD-10-CM | POA: Diagnosis not present

## 2022-03-08 DIAGNOSIS — N179 Acute kidney failure, unspecified: Secondary | ICD-10-CM | POA: Diagnosis not present

## 2022-03-08 DIAGNOSIS — K760 Fatty (change of) liver, not elsewhere classified: Secondary | ICD-10-CM | POA: Diagnosis not present

## 2022-03-08 DIAGNOSIS — R1013 Epigastric pain: Secondary | ICD-10-CM | POA: Diagnosis not present

## 2022-03-08 DIAGNOSIS — E876 Hypokalemia: Secondary | ICD-10-CM | POA: Diagnosis not present

## 2022-03-08 DIAGNOSIS — I5022 Chronic systolic (congestive) heart failure: Secondary | ICD-10-CM | POA: Diagnosis not present

## 2022-03-08 DIAGNOSIS — F10239 Alcohol dependence with withdrawal, unspecified: Secondary | ICD-10-CM | POA: Diagnosis not present

## 2022-03-08 DIAGNOSIS — F10939 Alcohol use, unspecified with withdrawal, unspecified: Secondary | ICD-10-CM | POA: Diagnosis not present

## 2022-03-08 DIAGNOSIS — E871 Hypo-osmolality and hyponatremia: Secondary | ICD-10-CM | POA: Diagnosis not present

## 2022-03-08 DIAGNOSIS — K700 Alcoholic fatty liver: Secondary | ICD-10-CM | POA: Diagnosis not present

## 2022-03-08 DIAGNOSIS — N2889 Other specified disorders of kidney and ureter: Secondary | ICD-10-CM | POA: Diagnosis not present

## 2022-03-08 DIAGNOSIS — E785 Hyperlipidemia, unspecified: Secondary | ICD-10-CM | POA: Diagnosis not present

## 2022-03-11 DIAGNOSIS — F339 Major depressive disorder, recurrent, unspecified: Secondary | ICD-10-CM | POA: Diagnosis not present

## 2022-03-11 DIAGNOSIS — F1020 Alcohol dependence, uncomplicated: Secondary | ICD-10-CM | POA: Diagnosis not present

## 2022-03-11 DIAGNOSIS — F411 Generalized anxiety disorder: Secondary | ICD-10-CM | POA: Diagnosis not present

## 2022-03-11 DIAGNOSIS — E785 Hyperlipidemia, unspecified: Secondary | ICD-10-CM | POA: Diagnosis not present

## 2022-03-11 DIAGNOSIS — F1220 Cannabis dependence, uncomplicated: Secondary | ICD-10-CM | POA: Diagnosis not present

## 2022-03-11 DIAGNOSIS — F419 Anxiety disorder, unspecified: Secondary | ICD-10-CM | POA: Diagnosis not present

## 2022-03-11 DIAGNOSIS — I10 Essential (primary) hypertension: Secondary | ICD-10-CM | POA: Diagnosis not present

## 2022-03-12 DIAGNOSIS — E785 Hyperlipidemia, unspecified: Secondary | ICD-10-CM | POA: Diagnosis not present

## 2022-03-12 DIAGNOSIS — F1220 Cannabis dependence, uncomplicated: Secondary | ICD-10-CM | POA: Diagnosis not present

## 2022-03-12 DIAGNOSIS — F419 Anxiety disorder, unspecified: Secondary | ICD-10-CM | POA: Diagnosis not present

## 2022-03-12 DIAGNOSIS — F1020 Alcohol dependence, uncomplicated: Secondary | ICD-10-CM | POA: Diagnosis not present

## 2022-03-12 DIAGNOSIS — F411 Generalized anxiety disorder: Secondary | ICD-10-CM | POA: Diagnosis not present

## 2022-03-12 DIAGNOSIS — F339 Major depressive disorder, recurrent, unspecified: Secondary | ICD-10-CM | POA: Diagnosis not present

## 2022-03-12 DIAGNOSIS — I10 Essential (primary) hypertension: Secondary | ICD-10-CM | POA: Diagnosis not present

## 2022-03-15 ENCOUNTER — Other Ambulatory Visit: Payer: Self-pay | Admitting: Family Medicine

## 2022-03-15 DIAGNOSIS — E7800 Pure hypercholesterolemia, unspecified: Secondary | ICD-10-CM

## 2022-03-16 DIAGNOSIS — I10 Essential (primary) hypertension: Secondary | ICD-10-CM | POA: Diagnosis not present

## 2022-03-16 DIAGNOSIS — F419 Anxiety disorder, unspecified: Secondary | ICD-10-CM | POA: Diagnosis not present

## 2022-03-16 DIAGNOSIS — F1020 Alcohol dependence, uncomplicated: Secondary | ICD-10-CM | POA: Diagnosis not present

## 2022-03-16 DIAGNOSIS — F339 Major depressive disorder, recurrent, unspecified: Secondary | ICD-10-CM | POA: Diagnosis not present

## 2022-03-16 DIAGNOSIS — E785 Hyperlipidemia, unspecified: Secondary | ICD-10-CM | POA: Diagnosis not present

## 2022-03-16 DIAGNOSIS — F411 Generalized anxiety disorder: Secondary | ICD-10-CM | POA: Diagnosis not present

## 2022-03-16 DIAGNOSIS — F1220 Cannabis dependence, uncomplicated: Secondary | ICD-10-CM | POA: Diagnosis not present

## 2022-03-17 ENCOUNTER — Encounter: Payer: Self-pay | Admitting: Gastroenterology

## 2022-03-22 DIAGNOSIS — F10988 Alcohol use, unspecified with other alcohol-induced disorder: Secondary | ICD-10-CM | POA: Diagnosis not present

## 2022-03-22 DIAGNOSIS — F10239 Alcohol dependence with withdrawal, unspecified: Secondary | ICD-10-CM | POA: Diagnosis not present

## 2022-03-22 DIAGNOSIS — K2920 Alcoholic gastritis without bleeding: Secondary | ICD-10-CM | POA: Diagnosis not present

## 2022-03-22 DIAGNOSIS — E872 Acidosis, unspecified: Secondary | ICD-10-CM | POA: Diagnosis not present

## 2022-03-22 DIAGNOSIS — F1020 Alcohol dependence, uncomplicated: Secondary | ICD-10-CM | POA: Diagnosis not present

## 2022-03-22 DIAGNOSIS — K449 Diaphragmatic hernia without obstruction or gangrene: Secondary | ICD-10-CM | POA: Diagnosis not present

## 2022-03-22 DIAGNOSIS — I10 Essential (primary) hypertension: Secondary | ICD-10-CM | POA: Diagnosis not present

## 2022-03-22 DIAGNOSIS — E785 Hyperlipidemia, unspecified: Secondary | ICD-10-CM | POA: Diagnosis not present

## 2022-03-22 DIAGNOSIS — F10230 Alcohol dependence with withdrawal, uncomplicated: Secondary | ICD-10-CM | POA: Diagnosis not present

## 2022-03-22 DIAGNOSIS — E8721 Acute metabolic acidosis: Secondary | ICD-10-CM | POA: Diagnosis not present

## 2022-03-22 DIAGNOSIS — I110 Hypertensive heart disease with heart failure: Secondary | ICD-10-CM | POA: Diagnosis not present

## 2022-03-22 DIAGNOSIS — R1013 Epigastric pain: Secondary | ICD-10-CM | POA: Diagnosis not present

## 2022-03-22 DIAGNOSIS — I5022 Chronic systolic (congestive) heart failure: Secondary | ICD-10-CM | POA: Diagnosis not present

## 2022-03-22 DIAGNOSIS — E8729 Other acidosis: Secondary | ICD-10-CM | POA: Diagnosis not present

## 2022-03-22 DIAGNOSIS — F3176 Bipolar disorder, in full remission, most recent episode depressed: Secondary | ICD-10-CM | POA: Diagnosis not present

## 2022-03-22 DIAGNOSIS — I426 Alcoholic cardiomyopathy: Secondary | ICD-10-CM | POA: Diagnosis not present

## 2022-03-22 DIAGNOSIS — R112 Nausea with vomiting, unspecified: Secondary | ICD-10-CM | POA: Diagnosis not present

## 2022-03-22 DIAGNOSIS — E860 Dehydration: Secondary | ICD-10-CM | POA: Diagnosis not present

## 2022-03-27 DIAGNOSIS — F332 Major depressive disorder, recurrent severe without psychotic features: Secondary | ICD-10-CM | POA: Diagnosis not present

## 2022-03-27 DIAGNOSIS — F411 Generalized anxiety disorder: Secondary | ICD-10-CM | POA: Diagnosis not present

## 2022-03-29 ENCOUNTER — Other Ambulatory Visit: Payer: Self-pay | Admitting: Family Medicine

## 2022-03-31 ENCOUNTER — Telehealth: Payer: Self-pay | Admitting: Family Medicine

## 2022-03-31 ENCOUNTER — Other Ambulatory Visit: Payer: Self-pay

## 2022-03-31 DIAGNOSIS — I10 Essential (primary) hypertension: Secondary | ICD-10-CM

## 2022-03-31 MED ORDER — AMLODIPINE BESYLATE 10 MG PO TABS: 10.0000 mg | ORAL_TABLET | Freq: Every day | ORAL | 2 refills | Status: DC

## 2022-03-31 NOTE — Telephone Encounter (Signed)
Refill for Amlodipine [facility] to [facility] in [facility]

## 2022-03-31 NOTE — Telephone Encounter (Signed)
Prescription Request  03/31/2022  Is this a "Controlled Substance" medicine? No  LOV: 02/13/2022  What is the name of the medication or equipment? amLODipine (NORVASC) 10 MG tablet   Have you contacted your pharmacy to request a refill? Yes   Which pharmacy would you like this sent to?  [id number] [id number] [id number]: [phone]: [phone]   Patient notified that their request is being sent to the clinical staff for review and that they should receive a response within 2 business days.   Please advise at Mobile [phone](mobile)

## 2022-04-06 DIAGNOSIS — F10239 Alcohol dependence with withdrawal, unspecified: Secondary | ICD-10-CM | POA: Diagnosis not present

## 2022-04-06 DIAGNOSIS — Z79899 Other long term (current) drug therapy: Secondary | ICD-10-CM | POA: Diagnosis not present

## 2022-04-06 DIAGNOSIS — R112 Nausea with vomiting, unspecified: Secondary | ICD-10-CM | POA: Diagnosis not present

## 2022-04-06 DIAGNOSIS — R079 Chest pain, unspecified: Secondary | ICD-10-CM | POA: Diagnosis not present

## 2022-04-06 DIAGNOSIS — E876 Hypokalemia: Secondary | ICD-10-CM | POA: Diagnosis not present

## 2022-04-06 DIAGNOSIS — E872 Acidosis, unspecified: Secondary | ICD-10-CM | POA: Diagnosis not present

## 2022-04-06 DIAGNOSIS — I5022 Chronic systolic (congestive) heart failure: Secondary | ICD-10-CM | POA: Diagnosis not present

## 2022-04-06 DIAGNOSIS — I447 Left bundle-branch block, unspecified: Secondary | ICD-10-CM | POA: Diagnosis not present

## 2022-04-06 DIAGNOSIS — F419 Anxiety disorder, unspecified: Secondary | ICD-10-CM | POA: Diagnosis not present

## 2022-04-06 DIAGNOSIS — E860 Dehydration: Secondary | ICD-10-CM | POA: Diagnosis not present

## 2022-04-06 DIAGNOSIS — F319 Bipolar disorder, unspecified: Secondary | ICD-10-CM | POA: Diagnosis not present

## 2022-04-06 DIAGNOSIS — F411 Generalized anxiety disorder: Secondary | ICD-10-CM | POA: Diagnosis not present

## 2022-04-06 DIAGNOSIS — Y900 Blood alcohol level of less than 20 mg/100 ml: Secondary | ICD-10-CM | POA: Diagnosis not present

## 2022-04-06 DIAGNOSIS — I110 Hypertensive heart disease with heart failure: Secondary | ICD-10-CM | POA: Diagnosis not present

## 2022-04-06 DIAGNOSIS — E785 Hyperlipidemia, unspecified: Secondary | ICD-10-CM | POA: Diagnosis not present

## 2022-04-06 DIAGNOSIS — M5090 Cervical disc disorder, unspecified, unspecified cervical region: Secondary | ICD-10-CM | POA: Diagnosis not present

## 2022-04-06 DIAGNOSIS — E8729 Other acidosis: Secondary | ICD-10-CM | POA: Diagnosis not present

## 2022-04-06 DIAGNOSIS — I426 Alcoholic cardiomyopathy: Secondary | ICD-10-CM | POA: Diagnosis not present

## 2022-04-06 DIAGNOSIS — K2920 Alcoholic gastritis without bleeding: Secondary | ICD-10-CM | POA: Diagnosis not present

## 2022-04-10 DIAGNOSIS — F1920 Other psychoactive substance dependence, uncomplicated: Secondary | ICD-10-CM | POA: Diagnosis not present

## 2022-04-10 DIAGNOSIS — F1020 Alcohol dependence, uncomplicated: Secondary | ICD-10-CM | POA: Diagnosis not present

## 2022-04-10 DIAGNOSIS — Z7141 Alcohol abuse counseling and surveillance of alcoholic: Secondary | ICD-10-CM | POA: Diagnosis not present

## 2022-04-11 DIAGNOSIS — F1020 Alcohol dependence, uncomplicated: Secondary | ICD-10-CM | POA: Diagnosis not present

## 2022-04-12 DIAGNOSIS — F1020 Alcohol dependence, uncomplicated: Secondary | ICD-10-CM | POA: Diagnosis not present

## 2022-04-13 DIAGNOSIS — B182 Chronic viral hepatitis C: Secondary | ICD-10-CM | POA: Diagnosis not present

## 2022-04-13 DIAGNOSIS — R768 Other specified abnormal immunological findings in serum: Secondary | ICD-10-CM | POA: Diagnosis not present

## 2022-04-13 DIAGNOSIS — Z1159 Encounter for screening for other viral diseases: Secondary | ICD-10-CM | POA: Diagnosis not present

## 2022-04-13 DIAGNOSIS — F1020 Alcohol dependence, uncomplicated: Secondary | ICD-10-CM | POA: Diagnosis not present

## 2022-04-13 DIAGNOSIS — R7989 Other specified abnormal findings of blood chemistry: Secondary | ICD-10-CM | POA: Diagnosis not present

## 2022-04-13 DIAGNOSIS — E079 Disorder of thyroid, unspecified: Secondary | ICD-10-CM | POA: Diagnosis not present

## 2022-04-14 DIAGNOSIS — F411 Generalized anxiety disorder: Secondary | ICD-10-CM | POA: Diagnosis not present

## 2022-04-14 DIAGNOSIS — F332 Major depressive disorder, recurrent severe without psychotic features: Secondary | ICD-10-CM | POA: Diagnosis not present

## 2022-04-14 DIAGNOSIS — F1020 Alcohol dependence, uncomplicated: Secondary | ICD-10-CM | POA: Diagnosis not present

## 2022-04-15 DIAGNOSIS — F332 Major depressive disorder, recurrent severe without psychotic features: Secondary | ICD-10-CM | POA: Diagnosis not present

## 2022-04-15 DIAGNOSIS — F1020 Alcohol dependence, uncomplicated: Secondary | ICD-10-CM | POA: Diagnosis not present

## 2022-04-15 DIAGNOSIS — F411 Generalized anxiety disorder: Secondary | ICD-10-CM | POA: Diagnosis not present

## 2022-04-16 DIAGNOSIS — F332 Major depressive disorder, recurrent severe without psychotic features: Secondary | ICD-10-CM | POA: Diagnosis not present

## 2022-04-16 DIAGNOSIS — F411 Generalized anxiety disorder: Secondary | ICD-10-CM | POA: Diagnosis not present

## 2022-04-16 DIAGNOSIS — F1020 Alcohol dependence, uncomplicated: Secondary | ICD-10-CM | POA: Diagnosis not present

## 2022-04-17 DIAGNOSIS — F411 Generalized anxiety disorder: Secondary | ICD-10-CM | POA: Diagnosis not present

## 2022-04-17 DIAGNOSIS — F1020 Alcohol dependence, uncomplicated: Secondary | ICD-10-CM | POA: Diagnosis not present

## 2022-04-17 DIAGNOSIS — F332 Major depressive disorder, recurrent severe without psychotic features: Secondary | ICD-10-CM | POA: Diagnosis not present

## 2022-04-18 DIAGNOSIS — F1020 Alcohol dependence, uncomplicated: Secondary | ICD-10-CM | POA: Diagnosis not present

## 2022-04-18 DIAGNOSIS — F411 Generalized anxiety disorder: Secondary | ICD-10-CM | POA: Diagnosis not present

## 2022-04-18 DIAGNOSIS — F332 Major depressive disorder, recurrent severe without psychotic features: Secondary | ICD-10-CM | POA: Diagnosis not present

## 2022-04-19 DIAGNOSIS — F332 Major depressive disorder, recurrent severe without psychotic features: Secondary | ICD-10-CM | POA: Diagnosis not present

## 2022-04-19 DIAGNOSIS — F1020 Alcohol dependence, uncomplicated: Secondary | ICD-10-CM | POA: Diagnosis not present

## 2022-04-19 DIAGNOSIS — F411 Generalized anxiety disorder: Secondary | ICD-10-CM | POA: Diagnosis not present

## 2022-04-20 DIAGNOSIS — F411 Generalized anxiety disorder: Secondary | ICD-10-CM | POA: Diagnosis not present

## 2022-04-20 DIAGNOSIS — F1020 Alcohol dependence, uncomplicated: Secondary | ICD-10-CM | POA: Diagnosis not present

## 2022-04-20 DIAGNOSIS — F17200 Nicotine dependence, unspecified, uncomplicated: Secondary | ICD-10-CM | POA: Diagnosis not present

## 2022-04-20 DIAGNOSIS — R42 Dizziness and giddiness: Secondary | ICD-10-CM | POA: Diagnosis not present

## 2022-04-20 DIAGNOSIS — F332 Major depressive disorder, recurrent severe without psychotic features: Secondary | ICD-10-CM | POA: Diagnosis not present

## 2022-04-20 DIAGNOSIS — R251 Tremor, unspecified: Secondary | ICD-10-CM | POA: Diagnosis not present

## 2022-04-21 DIAGNOSIS — F332 Major depressive disorder, recurrent severe without psychotic features: Secondary | ICD-10-CM | POA: Diagnosis not present

## 2022-04-21 DIAGNOSIS — F1020 Alcohol dependence, uncomplicated: Secondary | ICD-10-CM | POA: Diagnosis not present

## 2022-04-21 DIAGNOSIS — F411 Generalized anxiety disorder: Secondary | ICD-10-CM | POA: Diagnosis not present

## 2022-04-22 DIAGNOSIS — R059 Cough, unspecified: Secondary | ICD-10-CM | POA: Diagnosis not present

## 2022-04-22 DIAGNOSIS — R519 Headache, unspecified: Secondary | ICD-10-CM | POA: Diagnosis not present

## 2022-04-22 DIAGNOSIS — Z79891 Long term (current) use of opiate analgesic: Secondary | ICD-10-CM | POA: Diagnosis not present

## 2022-04-22 DIAGNOSIS — U071 COVID-19: Secondary | ICD-10-CM | POA: Diagnosis not present

## 2022-04-22 DIAGNOSIS — R7989 Other specified abnormal findings of blood chemistry: Secondary | ICD-10-CM | POA: Diagnosis not present

## 2022-04-22 DIAGNOSIS — R6883 Chills (without fever): Secondary | ICD-10-CM | POA: Diagnosis not present

## 2022-04-24 ENCOUNTER — Encounter: Payer: Self-pay | Admitting: Family Medicine

## 2022-04-24 ENCOUNTER — Telehealth (INDEPENDENT_AMBULATORY_CARE_PROVIDER_SITE_OTHER): Payer: BC Managed Care – PPO | Admitting: Family Medicine

## 2022-04-24 ENCOUNTER — Other Ambulatory Visit: Payer: Self-pay

## 2022-04-24 DIAGNOSIS — F3176 Bipolar disorder, in full remission, most recent episode depressed: Secondary | ICD-10-CM | POA: Diagnosis not present

## 2022-04-24 DIAGNOSIS — F332 Major depressive disorder, recurrent severe without psychotic features: Secondary | ICD-10-CM | POA: Diagnosis not present

## 2022-04-24 DIAGNOSIS — F1010 Alcohol abuse, uncomplicated: Secondary | ICD-10-CM

## 2022-04-24 DIAGNOSIS — F411 Generalized anxiety disorder: Secondary | ICD-10-CM | POA: Diagnosis not present

## 2022-04-24 DIAGNOSIS — F1029 Alcohol dependence with unspecified alcohol-induced disorder: Secondary | ICD-10-CM | POA: Diagnosis not present

## 2022-04-24 NOTE — Progress Notes (Signed)
Subjective:    Patient ID: Thomas Williamson, male    DOB: 09-Oct-1975, 47 y.o.   MRN: [id number]  HPI Virtual Visit via Video Note  I connected with the patient on 04/24/22 at 10:30 AM EDT by a video enabled telemedicine application and verified that I am speaking with the correct person using two identifiers.  Location patient: home Location provider:work or home office Persons participating in the virtual visit: patient, provider  I discussed the limitations of evaluation and management by telemedicine and the availability of in person appointments. The patient expressed understanding and agreed to proceed.   HPI: Here to follow up after a rehab stay. He was admitted to the rehab program at [facility] on 04-06-22 after he had a relapse of alcohol abuse. Then on 04-08-22 he was transferred to  program in [place], [place]. There he had individual and group therapy until he was discharged on 04-20-22 because he had a Covid infection. Since then he has recovered from Covid, and he is living with his parents. He tells me he is still sober. He was sent home with a brief Librium taper which is now finished. While there he was started on Abilify in addition to his usual Bupropion and Fluoxetine. He feels well in general.    ROS: See pertinent positives and negatives per HPI.  Past Medical History:  Diagnosis Date   Alcohol use disorder, severe, in early remission ([place]) 03/30/2018   Allergy    Anxiety    Bipolar disorder, in partial remission, most recent episode hypomanic ([place]) 03/30/2018   se[facility] Dr. [facility]   Bleeding internal hemorrhoids 09/12/2013   Cannabis dependence ([place]) 07/13/2018   Daily use   Cervical disc disease 11/20/2014   Chronic anxiety [id number]   Complication of anesthesia    Depression    GERD (gastroesophageal reflux disease)    Hernia, inguinal, bilateral 02/24/2011   High ankle sprain of right lower extremity 01/16/2015   History of hiatal hernia     Hx of hepatitis C 10/05/2017   -treated in 2019 -hepatology recommended no further surveillance needed except for LFTs with labs and see hepatologist if elevated   Hypertension    Lipids abnormal 09/29/2013   Pneumonia    PONV (postoperative nausea and vomiting)     Past Surgical History:  Procedure Laterality Date   ANTERIOR CRUCIATE LIGAMENT REPAIR  2010   BIOPSY  03/13/2019   Procedure: BIOPSY;  Surgeon: Yetta Flock, MD;  Location: [facility];  Service: Gastroenterology;;   COLONOSCOPY N/A 03/13/2019   per Dr. Havery Moros, adenomatous polyps, repeat in 3 yrs   [place]  02/23/2012   Procedure: LAPAROSCOPIC BILATERAL INGUINAL HERNIA REPAIR;  Surgeon: Shann Medal, MD;  Location: [facility];  Service: General;  Laterality: Bilateral;  Laparoscopic Bilateral Inguinal Hernia Repair with mesh   INSERTION OF MESH  02/23/2012   Procedure: INSERTION OF MESH;  Surgeon: Shann Medal, MD;  Location: [facility];  Service: General;  Laterality: N/A;   NOSE SURGERY  [id number]   POLYPECTOMY  03/13/2019   Procedure: POLYPECTOMY;  Surgeon: Yetta Flock, MD;  Location: [facility];  Service: Gastroenterology;;    Family History  Problem Relation Age of Onset   Hypertension Mother    Atrial fibrillation Mother    Cancer Father    Hypertension Father    Leukemia Father 19   Prostate cancer Father 4   Diabetes Father    Diabetes Brother  Other Brother        growth on finger   Learning disabilities Paternal Aunt    Lung cancer Maternal Grandmother    Heart attack Maternal Grandfather    Congestive Heart Failure Paternal Grandmother    Colon cancer Neg Hx      Current Outpatient Medications:    ABILIFY 2 MG tablet, Take 2 mg by mouth at bedtime., Disp: , Rfl:    amLODipine (NORVASC) 10 MG tablet, Take 1 tablet (10 mg total) by mouth daily., Disp: 30 tablet, Rfl: 2   buPROPion (WELLBUTRIN XL) 300 MG 24 hr tablet, Take 1 tablet (300 mg total) by mouth  daily., Disp: 90 tablet, Rfl: 0   FLUoxetine (PROZAC) 20 MG capsule, Take 20 mg by mouth daily., Disp: , Rfl:    folic acid (FOLVITE) 1 MG tablet, Take 1 tablet (1 mg total) by mouth daily., Disp: 90 tablet, Rfl: 3   gabapentin (NEURONTIN) 100 MG capsule, Take 100 mg by mouth 3 (three) times daily., Disp: , Rfl:    losartan-hydrochlorothiazide (HYZAAR) 100-25 MG tablet, TAKE 1 TABLET BY MOUTH EVERY DAY, Disp: 90 tablet, Rfl: 0   Magnesium Oxide 400 MG CAPS, Take 1 capsule (400 mg total) by mouth 2 (two) times daily., Disp: 180 capsule, Rfl: 3   metoprolol succinate (TOPROL-XL) 25 MG 24 hr tablet, Take 1 tablet (25 mg total) by mouth daily., Disp: 30 tablet, Rfl: 5   Omeprazole 20 MG TBEC, Take 20 mg by mouth daily. , Disp: , Rfl:    potassium chloride (KLOR-CON M) 10 MEQ tablet, Take 1 tablet (10 mEq total) by mouth daily., Disp: 90 tablet, Rfl: 3   pravastatin (PRAVACHOL) 40 MG tablet, TAKE 1 TABLET BY MOUTH EVERY DAY, Disp: 90 tablet, Rfl: 0   thiamine (VITAMIN B1) 100 MG tablet, Take 1 tablet (100 mg total) by mouth daily., Disp: 90 tablet, Rfl: 3   valACYclovir (VALTREX) 1000 MG tablet, Take 1 tablet (1,000 mg total) by mouth 2 (two) times daily as needed (fever blisters)., Disp: 60 tablet, Rfl: 5  EXAM:  VITALS per patient if applicable:  GENERAL: alert, oriented, appears well and in no acute distress  HEENT: atraumatic, conjunttiva clear, no obvious abnormalities on inspection of external nose and ears  NECK: normal movements of the head and neck  LUNGS: on inspection no signs of respiratory distress, breathing rate appears normal, no obvious gross SOB, gasping or wheezing  CV: no obvious cyanosis  MS: moves all visible extremities without noticeable abnormality  PSYCH/NEURO: pleasant and cooperative, no obvious depression or anxiety, speech and thought processing grossly intact  ASSESSMENT AND PLAN: He has gotten through another relapse of alcohol abuse, and he seems stable for  now. He will see his psychiatrist, Dr. Lennice Sites, later today. Follow up with [name] as needed.  Alysia Penna, MD  Discussed the following assessment and plan:  No diagnosis found.     I discussed the assessment and treatment plan with the patient. The patient was provided an opportunity to ask questions and all were answered. The patient agreed with the plan and demonstrated an understanding of the instructions.   The patient was advised to call back or seek an in-person evaluation if the symptoms worsen or if the condition fails to improve as anticipated.      Review of Systems     Objective:   Physical Exam        Assessment & Plan:

## 2022-05-06 ENCOUNTER — Telehealth: Payer: Self-pay | Admitting: Family Medicine

## 2022-05-06 NOTE — Telephone Encounter (Signed)
Please advise if pt need appointment

## 2022-05-06 NOTE — Telephone Encounter (Signed)
Pt is calling and his fissure has flare up and pt would like some medication to be sent to  [id number] [address], [place] Phone: [phone]  Fax: [phone]

## 2022-05-07 MED ORDER — MESALAMINE 1000 MG RE SUPP: 1000.0000 mg | Freq: Every day | RECTAL | 5 refills | Status: DC

## 2022-05-07 NOTE — Telephone Encounter (Signed)
I sent in Mesalamine suppositories

## 2022-05-07 NOTE — Telephone Encounter (Signed)
FYI Spoke with patient, to make aware prescription was sent to [facility], he stated that since yesterday the fissure has gotten a lot more swollen up.   Pt stated he wanted Dr. Sarajane Jews to be aware.

## 2022-05-13 DIAGNOSIS — F331 Major depressive disorder, recurrent, moderate: Secondary | ICD-10-CM | POA: Diagnosis not present

## 2022-05-19 ENCOUNTER — Ambulatory Visit: Payer: BC Managed Care – PPO | Attending: Cardiology

## 2022-05-19 ENCOUNTER — Ambulatory Visit: Payer: BC Managed Care – PPO | Attending: Cardiovascular Disease | Admitting: Cardiology

## 2022-05-19 ENCOUNTER — Encounter: Payer: Self-pay | Admitting: Cardiology

## 2022-05-19 ENCOUNTER — Ambulatory Visit: Payer: BC Managed Care – PPO | Admitting: Cardiovascular Disease

## 2022-05-19 VITALS — BP 116/78 | HR 77 | Ht 69.0 in | Wt 174.0 lb

## 2022-05-19 DIAGNOSIS — I426 Alcoholic cardiomyopathy: Secondary | ICD-10-CM | POA: Diagnosis not present

## 2022-05-19 DIAGNOSIS — R002 Palpitations: Secondary | ICD-10-CM

## 2022-05-19 DIAGNOSIS — I429 Cardiomyopathy, unspecified: Secondary | ICD-10-CM | POA: Diagnosis not present

## 2022-05-19 DIAGNOSIS — R0989 Other specified symptoms and signs involving the circulatory and respiratory systems: Secondary | ICD-10-CM

## 2022-05-19 NOTE — Progress Notes (Signed)
Cardiology Office Note:    Date:  05/23/2022   ID:  Jmichael Gille, DOB 11-14-1975, MRN [id number]  PCP:  Nelwyn Salisbury, MD  Cardiologist:  Thomasene Ripple, DO  Electrophysiologist:  None   Referring MD: Nelwyn Salisbury, MD    History of Present Illness:    Thomas Williamson is a 47 y.o. 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   He was seen in our practise several years ago, at that time he had an echo which showed EF od 30-35% despite counseling after negative stress test that this may be alcoholic cardiomyopathy he continued to drink alcohol.   He has recently stopped drinking for several months - since Dec 2023. He tells me he is  on the right track.   NO chest pain or shortness of breath. But reports intermittent palpitations.   Past Medical History:  Diagnosis Date   Alcohol use disorder, severe, in early remission 03/30/2018   Allergy    Anxiety    Bipolar disorder, in partial remission, most recent episode hypomanic 03/30/2018   [facility] Dr. [facility]a [facility]   Bleeding internal hemorrhoids 09/12/2013   Cannabis dependence 07/13/2018   Daily use   Cervical disc disease 11/20/2014   Chronic anxiety 07/13/2018   Complication of anesthesia    Depression    GERD (gastroesophageal reflux disease)    Hernia, inguinal, bilateral 02/24/2011   High ankle sprain of right lower extremity 01/16/2015   History of hiatal hernia    Hx of hepatitis C 10/05/2017   -treated in 2019 -hepatology recommended no further surveillance needed except for LFTs with labs and see hepatologist if elevated   Hypertension    Lipids abnormal 09/29/2013   Pneumonia    PONV (postoperative nausea and vomiting)     Past Surgical History:  Procedure  Laterality Date   ANTERIOR CRUCIATE LIGAMENT REPAIR  2010   BIOPSY  03/13/2019   Procedure: BIOPSY;  Surgeon: Benancio Deeds, MD;  Location: [facility];  Service: Gastroenterology;;   COLONOSCOPY N/A 03/13/2019   per Dr. Adela Lank, adenomatous polyps, repeat in 3 yrs   INGUINAL HERNIA REPAIR  02/23/2012   Procedure: LAPAROSCOPIC BILATERAL INGUINAL HERNIA REPAIR;  Surgeon: Kandis Cocking, MD;  Location: [facility];  Service: General;  Laterality: Bilateral;  Laparoscopic Bilateral Inguinal Hernia Repair with mesh   INSERTION OF MESH  02/23/2012   Procedure: INSERTION OF MESH;  Surgeon: Kandis Cocking, MD;  Location: [facility];  Service: General;  Laterality: N/A;   NOSE SURGERY  [id number]   POLYPECTOMY  03/13/2019   Procedure: POLYPECTOMY;  Surgeon: Benancio Deeds, MD;  Location: [facility];  Service: Gastroenterology;;    Current Medications: Current Meds  Medication Sig   amLODipine (NORVASC) 10 MG tablet Take 1 tablet (10 mg total) by mouth daily.   buPROPion (WELLBUTRIN XL) 300 MG 24 hr tablet Take 1 tablet (300 mg total) by mouth daily.   busPIRone (BUSPAR) 5 MG tablet Take 5 mg by mouth 2 (two) times daily.   FLUoxetine (PROZAC) 20 MG capsule Take 20 mg by mouth daily.   folic acid (FOLVITE) 1 MG tablet Take 1 tablet (1 mg total) by mouth daily.   gabapentin (NEURONTIN) 100 MG capsule Take 100 mg by mouth 3 (  three) times daily.   losartan-hydrochlorothiazide (HYZAAR) 100-25 MG tablet TAKE 1 TABLET BY MOUTH EVERY DAY   Magnesium Oxide 400 MG CAPS Take 1 capsule (400 mg total) by mouth 2 (two) times daily.   metoprolol succinate (TOPROL-XL) 25 MG 24 hr tablet Take 1 tablet (25 mg total) by mouth daily.   naltrexone (DEPADE) 50 MG tablet Take 50 mg by mouth daily.   Omeprazole 20 MG TBEC Take 20 mg by mouth daily.    potassium chloride (KLOR-CON M) 10 MEQ tablet Take 1 tablet (10 mEq total) by mouth daily.   pravastatin (PRAVACHOL) 40 MG tablet TAKE 1 TABLET BY MOUTH EVERY  DAY   thiamine (VITAMIN B1) 100 MG tablet Take 1 tablet (100 mg total) by mouth daily.   valACYclovir (VALTREX) 1000 MG tablet Take 1 tablet (1,000 mg total) by mouth 2 (two) times daily as needed (fever blisters).   [DISCONTINUED] ABILIFY 2 MG tablet Take 2 mg by mouth at bedtime.   [DISCONTINUED] mesalamine (CANASA) 1000 MG suppository Place 1 suppository (1,000 mg total) rectally at bedtime.     Allergies:   Hydrocodone   Social History   Socioeconomic History   Marital status: Single    Spouse name: Not on file   Number of children: 0   Years of education: Not on file   Highest education level: Bachelor's degree (e.g., BA, AB, BS)  Occupational History   Not on file  Tobacco Use   Smoking status: Former   Smokeless tobacco: Never  Vaping Use   Vaping Use: Former   Substances: CBD  Substance and Sexual Activity   Alcohol use: Yes    Alcohol/week: 4.0 standard drinks of alcohol    Types: 4 Shots of liquor per week    Comment: (recovering alcoholic) occassioanl drink   Drug use: Yes    Frequency: 2.0 times per week    Types: Marijuana   Sexual activity: Yes    Birth control/protection: Condom  Other Topics Concern   Not on file  Social History Narrative   Work or School: [employer] at [employer], 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, 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      Home Situation: lives alone      Spiritual Beliefs:      Lifestyle:       Hx alcohol abuse   Social Determinants of Health   Financial Resource Strain: Medium Risk (02/26/2018)   Overall Financial Resource Strain (CARDIA)    Difficulty of Paying Living Expenses: Somewhat hard  Food Insecurity: No Food Insecurity (01/08/2022)   Hunger Vital Sign    Worried About Running Out of Food in the Last Year: Never true    Ran Out of Food in the Last Year: Never true  Transportation Needs: No Transportation Needs (01/08/2022)   PRAPARE - Administrator, Civil Service (Medical): No    Lack of Transportation  (Non-Medical): No  Physical Activity: Inactive (12/10/2016)   Exercise Vital Sign    Days of Exercise per Week: 0 days    Minutes of Exercise per Session: 0 min  Stress: Stress Concern Present (12/10/2016)   [employer] of Occupational Health - Occupational Stress Questionnaire    Feeling of Stress : Very much  Social Connections: Unknown (02/26/2018)   Social Connection and Isolation Panel [NHANES]    Frequency of Communication with Friends and Family: Not on file    Frequency of Social Gatherings with Friends and Family: Not on file  Attends Religious Services: Not on file    Active Member of Clubs or Organizations: Yes    Attends Banker Meetings: More than 4 times per year    Marital Status: Not on file     Family History: The patient's family history includes Atrial fibrillation in his mother; Cancer in his father; Congestive Heart Failure in his paternal grandmother; Diabetes in his brother and father; Heart attack in his maternal grandfather; Hypertension in his father and mother; Learning disabilities in his paternal aunt; Leukemia (age of onset: 9) in his father; Lung cancer in his maternal grandmother; Other in his brother; Prostate cancer (age of onset: 65) in his father. There is no history of Colon cancer.  ROS:   Review of Systems  Constitution: Negative for decreased appetite, fever and weight gain.  HENT: Negative for congestion, ear discharge, hoarse voice and sore throat.   Eyes: Negative for discharge, redness, vision loss in right eye and visual halos.  Cardiovascular: Negative for chest pain, dyspnea on exertion, leg swelling, orthopnea and palpitations.  Respiratory: Negative for cough, hemoptysis, shortness of breath and snoring.   Endocrine: Negative for heat intolerance and polyphagia.  Hematologic/Lymphatic: Negative for bleeding problem. Does not bruise/bleed easily.  Skin: Negative for flushing, nail changes, rash and suspicious lesions.   Musculoskeletal: Negative for arthritis, joint pain, muscle cramps, myalgias, neck pain and stiffness.  Gastrointestinal: Negative for abdominal pain, bowel incontinence, diarrhea and excessive appetite.  Genitourinary: Negative for decreased libido, genital sores and incomplete emptying.  Neurological: Negative for brief paralysis, focal weakness, headaches and loss of balance.  Psychiatric/Behavioral: Negative for altered mental status, depression and suicidal ideas.  Allergic/Immunologic: Negative for HIV exposure and persistent infections.    EKGs/Labs/Other Studies Reviewed:    The following studies were reviewed today:   EKG:  The ekg ordered today demonstrates   Recent Labs: 02/13/2022: BUN 16; Creatinine, Ser 1.08; Magnesium 2.1; Potassium 4.6; Sodium 137  Recent Lipid Panel    Component Value Date/Time   CHOL 279 (H) 03/10/2021 1625   TRIG (H) 03/10/2021 1625    403.0 Triglyceride is over 400; calculations on Lipids are invalid.   HDL 49.80 03/10/2021 1625   CHOLHDL 6 03/10/2021 1625   VLDL 63.2 (H) 09/19/2018 0947   LDLCALC 139 (H) 12/11/2019 1057   LDLDIRECT 175.0 03/10/2021 1625    Physical Exam:    VS:  BP 116/78   Pulse 77   Ht  (1.753 m)   Wt 174 lb (78.9 kg)   SpO2 98%   BMI 25.70 kg/m     Wt Readings from Last 3 Encounters:  05/19/22 174 lb (78.9 kg)  02/13/22 175 lb (79.4 kg)  01/05/22 169 lb (76.7 kg)     GEN: Well nourished, well developed in no acute distress HEENT: Normal NECK: No JVD; No carotid bruits LYMPHATICS: No lymphadenopathy CARDIAC: S1S2 noted,RRR, no murmurs, rubs, gallops RESPIRATORY:  Clear to auscultation without rales, wheezing or rhonchi  ABDOMEN: Soft, non-tender, non-distended, +bowel sounds, no guarding. EXTREMITIES: No edema, No cyanosis, no clubbing MUSCULOSKELETAL:  No deformity  SKIN: Warm and dry NEUROLOGIC:  Alert and oriented x 3, non-focal PSYCHIATRIC:  Normal affect, good insight  ASSESSMENT:    1.  Cardiomyopathy, unspecified type   2. Palpitations   3. Cardiomyopathy, alcoholic   4. Depressed left ventricular ejection fraction    PLAN:    Known cardiomyopathy on losartan and toprol. He has stop using alcohol hoping this have improved his EF.  Will get a repeat echo. If EF has not improved will transition from losartan to entresto.    Will place a monitor on the patient due to his palpitations to make sure that afib is not playing a role here.  The patient is in agreement with the above plan. The patient left the office in stable condition.  The patient will follow up in   Medication Adjustments/Labs and Tests Ordered: Current medicines are reviewed at length with the patient today.  Concerns regarding medicines are outlined above.  Orders Placed This Encounter  Procedures   LONG TERM MONITOR (3-14 DAYS)   EKG 12-Lead   ECHOCARDIOGRAM COMPLETE   No orders of the defined types were placed in this encounter.   Patient Instructions  Medication Instructions:  Your physician recommends that you continue on your current medications as directed. Please refer to the Current Medication list given to you today.  *If you need a refill on your cardiac medications before your next appointment, please call your pharmacy*   Lab Work: None   Testing/Procedures: Your physician has requested that you have an echocardiogram. Echocardiography is a painless test that uses sound waves to create images of your heart. It provides your doctor with information about the size and shape of your heart and how well your heart's chambers and valves are working. This procedure takes approximately one hour. There are no restrictions for this procedure. Please do NOT wear cologne, perfume, aftershave, or lotions (deodorant is allowed). Please arrive 15 minutes prior to your appointment time.  ZIO XT- Long Term Monitor Instructions  Your physician has requested you wear a ZIO patch monitor for 14 days.   This is a single patch monitor. [employer] supplies one patch monitor per enrollment. Additional stickers are not available. Please do not apply patch if you will be having a Nuclear Stress Test,  Echocardiogram, Cardiac CT, MRI, or Chest Xray during the period you would be wearing the  monitor. The patch cannot be worn during these tests. You cannot remove and re-apply the  ZIO XT patch monitor.  Your ZIO patch monitor will be mailed 3 day USPS to your address on file. It may take 3-5 days  to receive your monitor after you have been enrolled.  Once you have received your monitor, please review the enclosed instructions. Your monitor  has already been registered assigning a specific monitor serial # to you.  Billing and Patient Assistance Program Information  We have supplied [employer] with any of your insurance information on file for billing purposes. [employer] offers a sliding scale Patient Assistance Program for patients that do not have  insurance, or whose insurance does not completely cover the cost of the ZIO monitor.  You must apply for the Patient Assistance Program to qualify for this discounted rate.  To apply, please call [employer] at [phone], select option 4, select option 2, ask to apply for  Patient Assistance Program. Meredeth Ide will ask your household income, and how many people  are in your household. They will quote your out-of-pocket cost based on that information.  [employer] will also be able to set up a 17-month, interest-free payment plan if needed.  Applying the monitor   Shave hair from upper left chest.  Hold abrader disc by orange tab. Rub abrader in 40 strokes over the upper left chest as  indicated in your monitor instructions.  Clean area with 4 enclosed alcohol pads. Let dry.  Apply patch as indicated in monitor instructions. Patch will  be placed under collarbone on left  side of chest with arrow pointing upward.  Rub patch adhesive wings for 2 minutes. Remove  white label marked "1". Remove the white  label marked "2". Rub patch adhesive wings for 2 additional minutes.  While looking in a mirror, press and release button in center of patch. A small green light will  flash 3-4 times. This will be your only indicator that the monitor has been turned on.  Do not shower for the first 24 hours. You may shower after the first 24 hours.  Press the button if you feel a symptom. You will hear a small click. Record Date, Time and  Symptom in the Patient Logbook.  When you are ready to remove the patch, follow instructions on the last 2 pages of Patient  Logbook. Stick patch monitor onto the last page of Patient Logbook.  Place Patient Logbook in the blue and white box. Use locking tab on box and tape box closed  securely. The blue and white box has prepaid postage on it. Please place it in the mailbox as  soon as possible. Your physician should have your test results approximately 7 days after the  monitor has been mailed back to [facility].  Call [employer] Customer Care at [phone] if you have questions regarding  your ZIO XT patch monitor. Call them immediately if you see an orange light blinking on your  monitor.  If your monitor falls off in less than 4 days, contact our Monitor department at [phone].  If your monitor becomes loose or falls off after 4 days call [employer] at [phone] for  suggestions on securing your monitor   Follow-Up: At [facility], you and your health needs are our priority.  As part of our continuing mission to provide you with exceptional heart care, we have created designated Provider Care Teams.  These Care Teams include your primary Cardiologist (physician) and Advanced Practice Providers (APPs -  Physician Assistants and Nurse Practitioners) who all work together to provide you with the care you need, when you need it.  We recommend signing up for the patient portal called "MyChart".  Sign up  information is provided on this After Visit Summary.  MyChart is used to connect with patients for Virtual Visits (Telemedicine).  Patients are able to view lab/test results, encounter notes, upcoming appointments, etc.  Non-urgent messages can be sent to your provider as well.   To learn more about what you can do with MyChart, go to [email or web address].    Your next appointment:   4 month(s)  Provider:   Thomasene Ripple, DO     Adopting a Healthy Lifestyle.  Know what a healthy weight is for you (roughly BMI <25) and aim to maintain this   Aim for 7+ servings of fruits and vegetables daily   65-80+ fluid ounces of water or unsweet tea for healthy kidneys   Limit to max 1 drink of alcohol per day; avoid smoking/tobacco   Limit animal fats in diet for cholesterol and heart health - choose grass fed whenever available   Avoid highly processed foods, and foods high in saturated/trans fats   Aim for low stress - take time to unwind and care for your mental health   Aim for 150 min of moderate intensity exercise weekly for heart health, and weights twice weekly for bone health   Aim for 7-9 hours of sleep daily   When it comes to diets, agreement about the perfect  plan isnt easy to find, even among the experts. Experts at the [facility] [employer] developed an idea known as the Healthy Eating Plate. Just imagine a plate divided into logical, healthy portions.   The emphasis is on diet quality:   Load up on vegetables and fruits - one-half of your plate: Aim for color and variety, and remember that potatoes dont count.   Go for whole grains - one-quarter of your plate: Whole wheat, barley, wheat berries, quinoa, oats, brown rice, and foods made with them. If you want pasta, go with whole wheat pasta.   Protein power - one-quarter of your plate: Fish, chicken, beans, and nuts are all healthy, versatile protein sources. Limit red meat.   The diet, however, does go beyond  the plate, offering a few other suggestions.   Use healthy plant oils, such as olive, canola, soy, corn, sunflower and peanut. Check the labels, and avoid partially hydrogenated oil, which have unhealthy trans fats.   If youre thirsty, drink water. Coffee and tea are good in moderation, but skip sugary drinks and limit milk and dairy products to one or two daily servings.   The type of carbohydrate in the diet is more important than the amount. Some sources of carbohydrates, such as vegetables, fruits, whole grains, and beans-are healthier than others.   Finally, stay active  Osvaldo Shipper, DO  05/23/2022 9:40 PM    [facility]

## 2022-05-19 NOTE — Progress Notes (Unsigned)
Enrolled patient for a 14 day ZIo XT monitor to be mailed to patients home  °

## 2022-05-19 NOTE — Patient Instructions (Addendum)
Medication Instructions:  Your physician recommends that you continue on your current medications as directed. Please refer to the Current Medication list given to you today.  *If you need a refill on your cardiac medications before your next appointment, please call your pharmacy*   Lab Work: None   Testing/Procedures: Your physician has requested that you have an echocardiogram. Echocardiography is a painless test that uses sound waves to create images of your heart. It provides your doctor with information about the size and shape of your heart and how well your heart's chambers and valves are working. This procedure takes approximately one hour. There are no restrictions for this procedure. Please do NOT wear cologne, perfume, aftershave, or lotions (deodorant is allowed). Please arrive 15 minutes prior to your appointment time.  ZIO XT- Long Term Monitor Instructions  Your physician has requested you wear a ZIO patch monitor for 14 days.  This is a single patch monitor. [employer] supplies one patch monitor per enrollment. Additional stickers are not available. Please do not apply patch if you will be having a Nuclear Stress Test,  Echocardiogram, Cardiac CT, MRI, or Chest Xray during the period you would be wearing the  monitor. The patch cannot be worn during these tests. You cannot remove and re-apply the  ZIO XT patch monitor.  Your ZIO patch monitor will be mailed 3 day USPS to your address on file. It may take 3-5 days  to receive your monitor after you have been enrolled.  Once you have received your monitor, please review the enclosed instructions. Your monitor  has already been registered assigning a specific monitor serial # to you.  Billing and Patient Assistance Program Information  We have supplied [employer] with any of your insurance information on file for billing purposes. [employer] offers a sliding scale Patient Assistance Program for patients that do not have   insurance, or whose insurance does not completely cover the cost of the ZIO monitor.  You must apply for the Patient Assistance Program to qualify for this discounted rate.  To apply, please call [employer] at [phone], select option 4, select option 2, ask to apply for  Patient Assistance Program. Theodore Demark will ask your household income, and how many people  are in your household. They will quote your out-of-pocket cost based on that information.  [employer] will also be able to set up a 91-month interest-free payment plan if needed.  Applying the monitor   Shave hair from upper left chest.  Hold abrader disc by orange tab. Rub abrader in 40 strokes over the upper left chest as  indicated in your monitor instructions.  Clean area with 4 enclosed alcohol pads. Let dry.  Apply patch as indicated in monitor instructions. Patch will be placed under collarbone on left  side of chest with arrow pointing upward.  Rub patch adhesive wings for 2 minutes. Remove white label marked "1". Remove the white  label marked "2". Rub patch adhesive wings for 2 additional minutes.  While looking in a mirror, press and release button in center of patch. A small green light will  flash 3-4 times. This will be your only indicator that the monitor has been turned on.  Do not shower for the first 24 hours. You may shower after the first 24 hours.  Press the button if you feel a symptom. You will hear a small click. Record Date, Time and  Symptom in the Patient Logbook.  When you are ready to remove the patch, follow  instructions on the last 2 pages of Patient  Logbook. Stick patch monitor onto the last page of Patient Logbook.  Place Patient Logbook in the blue and white box. Use locking tab on box and tape box closed  securely. The blue and white box has prepaid postage on it. Please place it in the mailbox as  soon as possible. Your physician should have your test results approximately 7 days after the  monitor  has been mailed back to [employer].  Call [employer] Customer Care at [phone] if you have questions regarding  your ZIO XT patch monitor. Call them immediately if you see an orange light blinking on your  monitor.  If your monitor falls off in less than 4 days, contact our Monitor department at [phone].  If your monitor becomes loose or falls off after 4 days call [employer] at [phone] for  suggestions on securing your monitor   Follow-Up: At [facility], you and your health needs are our priority.  As part of our continuing mission to provide you with exceptional heart care, we have created designated Provider Care Teams.  These Care Teams include your primary Cardiologist (physician) and Advanced Practice Providers (APPs -  Physician Assistants and Nurse Practitioners) who all work together to provide you with the care you need, when you need it.  We recommend signing up for the patient portal called "MyChart".  Sign up information is provided on this After Visit Summary.  MyChart is used to connect with patients for Virtual Visits (Telemedicine).  Patients are able to view lab/test results, encounter notes, upcoming appointments, etc.  Non-urgent messages can be sent to your provider as well.   To learn more about what you can do with MyChart, go to [email or web address].    Your next appointment:   4 month(s)  Provider:   Kardie Tobb, DO   

## 2022-05-25 DIAGNOSIS — R002 Palpitations: Secondary | ICD-10-CM | POA: Diagnosis not present

## 2022-05-26 DIAGNOSIS — F332 Major depressive disorder, recurrent severe without psychotic features: Secondary | ICD-10-CM | POA: Diagnosis not present

## 2022-05-26 DIAGNOSIS — F1029 Alcohol dependence with unspecified alcohol-induced disorder: Secondary | ICD-10-CM | POA: Diagnosis not present

## 2022-05-26 DIAGNOSIS — F411 Generalized anxiety disorder: Secondary | ICD-10-CM | POA: Diagnosis not present

## 2022-06-03 HISTORY — PX: PELVIC FRACTURE SURGERY: SHX119

## 2022-06-11 DIAGNOSIS — F10139 Alcohol abuse with withdrawal, unspecified: Secondary | ICD-10-CM | POA: Diagnosis not present

## 2022-06-11 DIAGNOSIS — E8729 Other acidosis: Secondary | ICD-10-CM | POA: Diagnosis not present

## 2022-06-11 DIAGNOSIS — I10 Essential (primary) hypertension: Secondary | ICD-10-CM | POA: Diagnosis not present

## 2022-06-11 DIAGNOSIS — R110 Nausea: Secondary | ICD-10-CM | POA: Diagnosis not present

## 2022-06-11 DIAGNOSIS — I4581 Long QT syndrome: Secondary | ICD-10-CM | POA: Diagnosis not present

## 2022-06-11 DIAGNOSIS — F419 Anxiety disorder, unspecified: Secondary | ICD-10-CM | POA: Diagnosis not present

## 2022-06-11 DIAGNOSIS — E871 Hypo-osmolality and hyponatremia: Secondary | ICD-10-CM | POA: Diagnosis not present

## 2022-06-11 DIAGNOSIS — I4510 Unspecified right bundle-branch block: Secondary | ICD-10-CM | POA: Diagnosis not present

## 2022-06-11 DIAGNOSIS — F3176 Bipolar disorder, in full remission, most recent episode depressed: Secondary | ICD-10-CM | POA: Diagnosis not present

## 2022-06-11 DIAGNOSIS — E872 Acidosis, unspecified: Secondary | ICD-10-CM | POA: Diagnosis not present

## 2022-06-11 DIAGNOSIS — F10939 Alcohol use, unspecified with withdrawal, unspecified: Secondary | ICD-10-CM | POA: Diagnosis not present

## 2022-06-11 DIAGNOSIS — F1210 Cannabis abuse, uncomplicated: Secondary | ICD-10-CM | POA: Diagnosis not present

## 2022-06-11 DIAGNOSIS — E876 Hypokalemia: Secondary | ICD-10-CM | POA: Diagnosis not present

## 2022-06-11 DIAGNOSIS — Z79899 Other long term (current) drug therapy: Secondary | ICD-10-CM | POA: Diagnosis not present

## 2022-06-11 DIAGNOSIS — F10229 Alcohol dependence with intoxication, unspecified: Secondary | ICD-10-CM | POA: Diagnosis not present

## 2022-06-11 DIAGNOSIS — E878 Other disorders of electrolyte and fluid balance, not elsewhere classified: Secondary | ICD-10-CM | POA: Diagnosis not present

## 2022-06-11 DIAGNOSIS — Z7902 Long term (current) use of antithrombotics/antiplatelets: Secondary | ICD-10-CM | POA: Diagnosis not present

## 2022-06-11 DIAGNOSIS — I426 Alcoholic cardiomyopathy: Secondary | ICD-10-CM | POA: Diagnosis not present

## 2022-06-11 DIAGNOSIS — I509 Heart failure, unspecified: Secondary | ICD-10-CM | POA: Diagnosis not present

## 2022-06-11 DIAGNOSIS — F32A Depression, unspecified: Secondary | ICD-10-CM | POA: Diagnosis not present

## 2022-06-11 DIAGNOSIS — D649 Anemia, unspecified: Secondary | ICD-10-CM | POA: Diagnosis not present

## 2022-06-11 DIAGNOSIS — F1020 Alcohol dependence, uncomplicated: Secondary | ICD-10-CM | POA: Diagnosis not present

## 2022-06-11 DIAGNOSIS — F319 Bipolar disorder, unspecified: Secondary | ICD-10-CM | POA: Diagnosis not present

## 2022-06-11 DIAGNOSIS — R112 Nausea with vomiting, unspecified: Secondary | ICD-10-CM | POA: Diagnosis not present

## 2022-06-11 DIAGNOSIS — R1111 Vomiting without nausea: Secondary | ICD-10-CM | POA: Diagnosis not present

## 2022-06-11 DIAGNOSIS — R000 Tachycardia, unspecified: Secondary | ICD-10-CM | POA: Diagnosis not present

## 2022-06-11 DIAGNOSIS — E785 Hyperlipidemia, unspecified: Secondary | ICD-10-CM | POA: Diagnosis not present

## 2022-06-14 DIAGNOSIS — F10939 Alcohol use, unspecified with withdrawal, unspecified: Secondary | ICD-10-CM | POA: Diagnosis not present

## 2022-06-14 DIAGNOSIS — F419 Anxiety disorder, unspecified: Secondary | ICD-10-CM | POA: Diagnosis not present

## 2022-06-14 DIAGNOSIS — F32A Depression, unspecified: Secondary | ICD-10-CM | POA: Diagnosis not present

## 2022-06-15 ENCOUNTER — Telehealth: Payer: Self-pay

## 2022-06-15 NOTE — Transitions of Care (Post Inpatient/ED Visit) (Signed)
06/15/2022  Name: Thomas Williamson MRN: [id number] DOB: 1975-09-16  Today's TOC FU Call Status: Today's TOC FU Call Status:: Successful TOC FU Call Competed TOC FU Call Complete Date: 06/15/22  Transition Care Management Follow-up Telephone Call Date of Discharge: 06/14/22 Discharge Facility: Other ([facility]) Name of Other ([facility]) Discharge Facility: [facility] Type of Discharge: Inpatient Admission Primary Inpatient Discharge Diagnosis:: "N&V, unspecified type,alcohol dependence with intoxication with complication" How have you been since you were released from the hospital?: Better Any questions or concerns?: No  Items Reviewed: Did you receive and understand the discharge instructions provided?: Yes Medications obtained,verified, and reconciled?: No (pt currently driving to job interview-unable to complete full med review-discussed med changes at d/c-pt has not pickrd up new meds yet-plans to get them later today) Medications Not Reviewed Reasons:: Other: Any new allergies since your discharge?: No Dietary orders reviewed?: Yes Type of Diet Ordered:: low salt/heart healthy Do you have support at home?: Yes People in Home: parent(s) Name of Support/Comfort Primary Source: [place]-dad  Medications Reviewed Today: Medications Reviewed Today     Reviewed by Winifred Olive, CMA (Certified Medical Assistant) on 05/19/22 at 1106  Med List Status: <None>   Medication Order Taking? Sig Documenting Provider Last Dose Status Informant  Discontinued 05/19/22 1106   amLODipine (NORVASC) 10 MG tablet [id number] Yes Take 1 tablet (10 mg total) by mouth daily. Nelwyn Salisbury, MD Taking Active   buPROPion (WELLBUTRIN XL) 300 MG 24 hr tablet [id number] Yes Take 1 tablet (300 mg total) by mouth daily. Patrick North, MD Taking Active   busPIRone (BUSPAR) 5 MG tablet [id number] Yes Take 5 mg by mouth 2 (two) times daily. [provider] Taking Active   FLUoxetine (PROZAC) 20 MG  capsule [id number] Yes Take 20 mg by mouth daily. [provider] Taking Active   folic acid (FOLVITE) 1 MG tablet [id number] Yes Take 1 tablet (1 mg total) by mouth daily. Nelwyn Salisbury, MD Taking Active   gabapentin (NEURONTIN) 100 MG capsule [id number] Yes Take 100 mg by mouth 3 (three) times daily. [provider] Taking Active   losartan-hydrochlorothiazide (HYZAAR) 100-25 MG tablet [id number] Yes TAKE 1 TABLET BY MOUTH EVERY DAY Nelwyn Salisbury, MD Taking Active   Magnesium Oxide 400 MG CAPS [id number] Yes Take 1 capsule (400 mg total) by mouth 2 (two) times daily. Nelwyn Salisbury, MD Taking Active   metoprolol succinate (TOPROL-XL) 25 MG 24 hr tablet [id number] Yes Take 1 tablet (25 mg total) by mouth daily. Nelwyn Salisbury, MD Taking Active   naltrexone (DEPADE) 50 MG tablet [id number] Yes Take 50 mg by mouth daily. [provider] Taking Active   Omeprazole 20 MG TBEC [id number] Yes Take 20 mg by mouth daily.  [provider] Taking Active Self  potassium chloride (KLOR-CON M) 10 MEQ tablet [id number] Yes Take 1 tablet (10 mEq total) by mouth daily. Nelwyn Salisbury, MD Taking Active   pravastatin (PRAVACHOL) 40 MG tablet [id number] Yes TAKE 1 TABLET BY MOUTH EVERY DAY Nelwyn Salisbury, MD Taking Active   thiamine (VITAMIN B1) 100 MG tablet [id number] Yes Take 1 tablet (100 mg total) by mouth daily. Nelwyn Salisbury, MD Taking Active   valACYclovir (VALTREX) 1000 MG tablet [id number] Yes Take 1 tablet (1,000 mg total) by mouth 2 (two) times daily as needed (fever blisters). Nelwyn Salisbury, MD Taking Active Self            Home Care and Equipment/Supplies: Were Home Health  Services Ordered?: NA Any new equipment or medical supplies ordered?: NA  Functional Questionnaire: Do you need assistance with bathing/showering or dressing?: No Do you need assistance with meal preparation?: No Do you need assistance with eating?: No Do you have difficulty maintaining continence:  No Do you need assistance with getting out of bed/getting out of a chair/moving?: No Do you have difficulty managing or taking your medications?: No  Follow up appointments reviewed: PCP Follow-up appointment confirmed?: Yes Date of PCP follow-up appointment?: 06/18/22 Follow-up Provider: Dr. Clent Ridges Specialist [facility] Follow-up appointment confirmed?: No Reason Specialist Follow-Up Not Confirmed: Patient has Specialist Provider Number and will Call for Appointment (pt was given list on d/c summary of resources/agencies to follow up on alcohol abuse programs) Do you need transportation to your follow-up appointment?: No Do you understand care options if your condition(s) worsen?: Yes-patient verbalized understanding  SDOH Interventions Today    Flowsheet Row Most Recent Value  SDOH Interventions   Food Insecurity Interventions Intervention Not Indicated  Transportation Interventions Intervention Not Indicated       TOC Interventions Today    Flowsheet Row Most Recent Value  TOC Interventions   TOC Interventions Discussed/Reviewed TOC Interventions Discussed, Arranged PCP follow up within 7 days/Care Guide scheduled      Interventions Today    Flowsheet Row Most Recent Value  General Interventions   General Interventions Discussed/Reviewed General Interventions Discussed, Doctor Visits  Doctor Visits Discussed/Reviewed PCP, Doctor Visits Discussed, Specialist  PCP/Specialist Visits Compliance with follow-up visit  Education Interventions   Education Provided Provided Education  Provided Verbal Education On Nutrition, When to see the doctor, Medication, Other  Nutrition Interventions   Nutrition Discussed/Reviewed Nutrition Discussed  Pharmacy Interventions   Pharmacy Dicussed/Reviewed Pharmacy Topics Discussed, Medications and their functions        Alessandra Grout [facility] Care Management Care Management Community Coordinator Direct Phone:  [phone] Toll Free: [phone] Fax: [phone]

## 2022-06-17 ENCOUNTER — Other Ambulatory Visit: Payer: BC Managed Care – PPO

## 2022-06-17 DIAGNOSIS — F331 Major depressive disorder, recurrent, moderate: Secondary | ICD-10-CM | POA: Diagnosis not present

## 2022-06-18 ENCOUNTER — Encounter: Payer: Self-pay | Admitting: Family Medicine

## 2022-06-18 ENCOUNTER — Ambulatory Visit (INDEPENDENT_AMBULATORY_CARE_PROVIDER_SITE_OTHER): Payer: BC Managed Care – PPO | Admitting: Family Medicine

## 2022-06-18 VITALS — BP 118/78 | HR 77 | Temp 98.6°F | Wt 168.9 lb

## 2022-06-18 DIAGNOSIS — I426 Alcoholic cardiomyopathy: Secondary | ICD-10-CM

## 2022-06-18 DIAGNOSIS — E871 Hypo-osmolality and hyponatremia: Secondary | ICD-10-CM

## 2022-06-18 DIAGNOSIS — E876 Hypokalemia: Secondary | ICD-10-CM | POA: Diagnosis not present

## 2022-06-18 DIAGNOSIS — F419 Anxiety disorder, unspecified: Secondary | ICD-10-CM

## 2022-06-18 DIAGNOSIS — R002 Palpitations: Secondary | ICD-10-CM | POA: Diagnosis not present

## 2022-06-18 DIAGNOSIS — E8729 Other acidosis: Secondary | ICD-10-CM

## 2022-06-18 LAB — BASIC METABOLIC PANEL
BUN: 10 mg/dL (ref 6–23)
CO2: 26 mEq/L (ref 19–32)
Calcium: 10.7 mg/dL — ABNORMAL HIGH (ref 8.4–10.5)
Chloride: 99 mEq/L (ref 96–112)
Creatinine, Ser: 0.99 mg/dL (ref 0.40–1.50)
GFR: 90.99 mL/min (ref >60.00)
Glucose, Bld: 101 mg/dL — ABNORMAL HIGH (ref 70–99)
Potassium: 4 mEq/L (ref 3.5–5.1)
Sodium: 136 mEq/L (ref 135–145)

## 2022-06-18 MED ORDER — BUSPIRONE HCL 5 MG PO TABS: 5.0000 mg | ORAL_TABLET | Freq: Two times a day (BID) | ORAL | 0 refills | Status: DC

## 2022-06-18 MED ORDER — LORAZEPAM 1 MG PO TABS: 1.0000 mg | ORAL_TABLET | Freq: Three times a day (TID) | ORAL | 0 refills | Status: DC | PRN

## 2022-06-18 NOTE — Progress Notes (Signed)
   Subjective:    Patient ID: Thomas Williamson, male    DOB: [birth year], 47 y.o.   MRN: [id number]  HPI Here to follow up on a hospital stay from 06-11-22 to 06-14-22 at [facility] in [place]. He presented with nausea and vomiting and dehydration. He has a hx of alcoholism, and he admitted to drinking alcohol heavily for 2 days prior to this. He was intoxicated, and he was in metabolic ketoacidosis. His sodium and potassium were very low. He was treated with IV fluids and with electrolytes, and he quickly felt better. An ECHO was performed due to his hx of alcoholic cardiomyopathy, and his EF was found to be 30-35% with global LV hypokinesis. He was discharged on Buspar 5 mg BID and Naltrexone 50 mg daily. He has felt better from a physical standpoint, and his appetite is improving. However his anxiety levels are extremely high. He asks for a small supply of Lorazepam to help calm his nerves. He has an appt to see his psychiatrist, Dr. Tomasa Hose, one day next week. He continues to meet virtually with his therapist every week. He assures me that he has had no alcohol since his DC. 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 He is excited about the job.   Review of Systems  Constitutional: Negative.   Respiratory: Negative.    Cardiovascular: Negative.   Gastrointestinal: Negative.   Genitourinary: Negative.   Neurological: Negative.   Psychiatric/Behavioral:  Negative for agitation, behavioral problems, confusion, decreased concentration, dysphoric mood, hallucinations, self-injury, sleep disturbance and suicidal ideas. The patient is nervous/anxious.        Objective:   Physical Exam Constitutional:      Appearance: Normal appearance. He is not ill-appearing.  Cardiovascular:     Rate and Rhythm: Normal rate and regular rhythm.     Pulses: Normal pulses.     Heart sounds: Normal heart sounds.   Pulmonary:     Effort: Pulmonary effort is normal.     Breath sounds: Normal breath sounds.  Abdominal:     General: Abdomen is flat. Bowel sounds are normal.     Palpations: Abdomen is soft. There is no mass.     Tenderness: There is no abdominal tenderness. There is no guarding or rebound.     Hernia: No hernia is present.  Neurological:     General: No focal deficit present.     Mental Status: He is alert and oriented to person, place, and time.  Psychiatric:        Thought Content: Thought content normal.     Comments: He is very anxious            Assessment & Plan:  He is recovering from an episode of alcoholic ketoacidosis from excessive alcohol intake. He is drinking fluids and his appetite is returning. We will check a BMET today for his electrolyte levels. He will see Dr. Tomasa Hose one day next week for his anxiety and depression, but until then he can add Lorazepam 1 mg as needed to the Buspar and Naltrexone. He is scheduled to see his cardiologist, Dr. Thomasene Ripple, on 09-22-22. We spent a total of (   ) minutes reviewing records and discussing these issues.  Gershon Crane, MD

## 2022-06-20 ENCOUNTER — Other Ambulatory Visit: Payer: Self-pay | Admitting: Family Medicine

## 2022-06-20 DIAGNOSIS — E7800 Pure hypercholesterolemia, unspecified: Secondary | ICD-10-CM

## 2022-06-23 DIAGNOSIS — F332 Major depressive disorder, recurrent severe without psychotic features: Secondary | ICD-10-CM | POA: Diagnosis not present

## 2022-06-23 DIAGNOSIS — F411 Generalized anxiety disorder: Secondary | ICD-10-CM | POA: Diagnosis not present

## 2022-06-23 DIAGNOSIS — F1029 Alcohol dependence with unspecified alcohol-induced disorder: Secondary | ICD-10-CM | POA: Diagnosis not present

## 2022-06-24 DIAGNOSIS — F331 Major depressive disorder, recurrent, moderate: Secondary | ICD-10-CM | POA: Diagnosis not present

## 2022-06-29 ENCOUNTER — Other Ambulatory Visit: Payer: Self-pay

## 2022-06-29 ENCOUNTER — Emergency Department: Payer: BC Managed Care – PPO

## 2022-06-29 ENCOUNTER — Emergency Department
Admission: EM | Admit: 2022-06-29 | Discharge: 2022-06-30 | Disposition: A | Discharge status: Home / Self Care | Payer: BC Managed Care – PPO | Attending: Emergency Medicine | Admitting: Emergency Medicine

## 2022-06-29 DIAGNOSIS — S73015A Posterior dislocation of left hip, initial encounter: Secondary | ICD-10-CM | POA: Diagnosis not present

## 2022-06-29 DIAGNOSIS — S73006A Unspecified dislocation of unspecified hip, initial encounter: Secondary | ICD-10-CM | POA: Diagnosis not present

## 2022-06-29 DIAGNOSIS — S73005A Unspecified dislocation of left hip, initial encounter: Secondary | ICD-10-CM

## 2022-06-29 DIAGNOSIS — S79912A Unspecified injury of left hip, initial encounter: Secondary | ICD-10-CM | POA: Diagnosis not present

## 2022-06-29 DIAGNOSIS — S32422A Displaced fracture of posterior wall of left acetabulum, initial encounter for closed fracture: Secondary | ICD-10-CM | POA: Diagnosis not present

## 2022-06-29 DIAGNOSIS — I10 Essential (primary) hypertension: Secondary | ICD-10-CM | POA: Diagnosis not present

## 2022-06-29 DIAGNOSIS — S32425A Nondisplaced fracture of posterior wall of left acetabulum, initial encounter for closed fracture: Secondary | ICD-10-CM | POA: Diagnosis not present

## 2022-06-29 DIAGNOSIS — W010XXA Fall on same level from slipping, tripping and stumbling without subsequent striking against object, initial encounter: Secondary | ICD-10-CM | POA: Diagnosis not present

## 2022-06-29 DIAGNOSIS — S72002A Fracture of unspecified part of neck of left femur, initial encounter for closed fracture: Secondary | ICD-10-CM | POA: Diagnosis not present

## 2022-06-29 LAB — CBC WITH DIFFERENTIAL/PLATELET
Abs Immature Granulocytes: 0.04 10*3/uL (ref 0.00–0.07)
Basophils Absolute: 0.1 10*3/uL (ref 0.0–0.1)
Basophils Relative: 1 %
Eosinophils Absolute: 0.1 10*3/uL (ref 0.0–0.5)
Eosinophils Relative: 1 %
HCT: 36.5 % — ABNORMAL LOW (ref 39.0–52.0)
Hemoglobin: 12.5 g/dL — ABNORMAL LOW (ref 13.0–17.0)
Immature Granulocytes: 0 %
Lymphocytes Relative: 38 %
Lymphs Abs: 4.3 10*3/uL — ABNORMAL HIGH (ref 0.7–4.0)
MCH: 31.9 pg (ref 26.0–34.0)
MCHC: 34.2 g/dL (ref 30.0–36.0)
MCV: 93.1 fL (ref 80.0–100.0)
Monocytes Absolute: 0.6 10*3/uL (ref 0.1–1.0)
Monocytes Relative: 5 %
Neutro Abs: 6.3 10*3/uL (ref 1.7–7.7)
Neutrophils Relative %: 55 %
Platelets: 481 10*3/uL — ABNORMAL HIGH (ref 150–400)
RBC: 3.92 MIL/uL — ABNORMAL LOW (ref 4.22–5.81)
RDW: 12.4 % (ref 11.5–15.5)
WBC: 11.4 10*3/uL — ABNORMAL HIGH (ref 4.0–10.5)
nRBC: 0 % (ref 0.0–0.2)

## 2022-06-29 MED ORDER — ONDANSETRON HCL 4 MG/2ML IJ SOLN
4.0000 mg | Freq: Once | INTRAMUSCULAR | Status: AC
  Administered 2022-06-29: 4 mg via INTRAVENOUS
  Filled 2022-06-29: qty 2

## 2022-06-29 MED ORDER — HYDROMORPHONE HCL 1 MG/ML IJ SOLN
1.0000 mg | Freq: Once | INTRAMUSCULAR | Status: AC
  Administered 2022-06-29: 1 mg via INTRAVENOUS
  Filled 2022-06-29: qty 1

## 2022-06-29 NOTE — ED Triage Notes (Signed)
Pt arrives EMS from home with reports of tripping and falling down approximately 8 stairs. Pt denies hitting head and denies loss of consciousness. Pt denies blood thinner use. Pt report pain to left hip and unable to straighten left leg. Pt has strong pedal pulse.

## 2022-06-29 NOTE — ED Provider Notes (Signed)
[facility] Provider Note    Event Date/Time   First MD Initiated Contact with Patient 06/29/22 2328     (approximate)   History   No chief complaint on file.   HPI  Diontre Sciullo is a 47 y.o. male with history of alcohol use disorder, bipolar disorder, dilated cardiomyopathy, and hypertension who presents with left hip pain after a fall.  The patient states that he was helping his dog down the stairs when he tripped and fell, hitting his left hip.  He denies hitting his head.  He did not lose consciousness.  He has no back pain and denies any other injuries.  Reviewed the past medical records.  The patient was most recently seen by [place] primary care on 5/16 for follow-up after a recent hospitalization at [facility] for alcoholic ketoacidosis.   Physical Exam   Triage Vital Signs: ED Triage Vitals  Enc Vitals Group     BP      Pulse      Resp      Temp      Temp src      SpO2      Weight      Height      Head Circumference      Peak Flow      Pain Score      Pain Loc      Pain Edu?      Excl. in GC?     Most recent vital signs: There were no vitals filed for this visit.   General: Awake, uncomfortable appearing, no distress.  CV:  Good peripheral perfusion.  Resp:  Normal effort.  Abd:  No distention.  Other:  Left hip held in slight flexion and internal rotation with pain on any range of motion.  No deformity.  2+ DP pulse.  Normal cap refill distally.  Strength and sensation intact distally.   ED Results / Procedures / Treatments   Labs (all labs ordered are listed, but only abnormal results are displayed) Labs Reviewed  BASIC METABOLIC PANEL  CBC WITH DIFFERENTIAL/PLATELET     EKG     RADIOLOGY  XR L hip: I independently viewed and interpreted the images; ***  PROCEDURES:  Critical Care performed: {CriticalCareYesNo:19197::"Yes, see critical care procedure note(s)","No"}  Procedures   MEDICATIONS ORDERED IN  ED: Medications  HYDROmorphone (DILAUDID) injection 1 mg (has no administration in time range)  ondansetron (ZOFRAN) injection 4 mg (has no administration in time range)     IMPRESSION / MDM / ASSESSMENT AND PLAN / ED COURSE  I reviewed the triage vital signs and the nursing notes.  47 year old male with PMH as noted above presents with left hip injury after mechanical fall while coming down the stairs.  He did not hit his head and denies other injuries.  On exam he is uncomfortable appearing and is holding the hip flexed and internally rotated although there is no significant deformity.  The extremity is neuro/vascular intact.  Differential diagnosis includes, but is not limited to, hip fracture, dislocation, pelvic fracture, contusion, muscle strain, other soft tissue injury.  We will obtain x-rays, basic labs, give analgesia, and reassess.  Patient's presentation is most consistent with acute presentation with potential threat to life or bodily function.  The patient is on the cardiac monitor to evaluate for evidence of arrhythmia and/or significant heart rate changes.   FINAL CLINICAL IMPRESSION(S) / ED DIAGNOSES   Final diagnoses:  None  Rx / DC Orders   ED Discharge Orders     None        Note:  This document was prepared using Dragon voice recognition software and may include unintentional dictation errors.

## 2022-06-30 ENCOUNTER — Inpatient Hospital Stay (HOSPITAL_COMMUNITY): Payer: BC Managed Care – PPO

## 2022-06-30 ENCOUNTER — Inpatient Hospital Stay (HOSPITAL_COMMUNITY): Payer: BC Managed Care – PPO | Admitting: Anesthesiology

## 2022-06-30 ENCOUNTER — Encounter (HOSPITAL_COMMUNITY): Payer: Self-pay | Admitting: Internal Medicine

## 2022-06-30 ENCOUNTER — Emergency Department: Payer: BC Managed Care – PPO

## 2022-06-30 ENCOUNTER — Inpatient Hospital Stay (HOSPITAL_COMMUNITY)
Admission: EM | Admit: 2022-06-30 | Discharge: 2022-07-10 | DRG: 480 | Disposition: A | Discharge status: Home / Self Care | Payer: BC Managed Care – PPO | Source: Ambulatory Visit | Attending: Family Medicine | Admitting: Family Medicine

## 2022-06-30 ENCOUNTER — Encounter: Payer: Self-pay | Admitting: Internal Medicine

## 2022-06-30 ENCOUNTER — Encounter (HOSPITAL_COMMUNITY): Payer: Self-pay

## 2022-06-30 ENCOUNTER — Encounter (HOSPITAL_COMMUNITY)
Admission: EM | Disposition: A | Discharge status: Home / Self Care | Payer: Self-pay | Source: Ambulatory Visit | Attending: Internal Medicine

## 2022-06-30 ENCOUNTER — Other Ambulatory Visit: Payer: Self-pay

## 2022-06-30 DIAGNOSIS — Z800 Family history of malignant neoplasm of digestive organs: Secondary | ICD-10-CM

## 2022-06-30 DIAGNOSIS — Z8042 Family history of malignant neoplasm of prostate: Secondary | ICD-10-CM

## 2022-06-30 DIAGNOSIS — I5022 Chronic systolic (congestive) heart failure: Secondary | ICD-10-CM | POA: Diagnosis present

## 2022-06-30 DIAGNOSIS — E8809 Other disorders of plasma-protein metabolism, not elsewhere classified: Secondary | ICD-10-CM | POA: Diagnosis not present

## 2022-06-30 DIAGNOSIS — E785 Hyperlipidemia, unspecified: Secondary | ICD-10-CM | POA: Diagnosis present

## 2022-06-30 DIAGNOSIS — S73015A Posterior dislocation of left hip, initial encounter: Secondary | ICD-10-CM | POA: Diagnosis not present

## 2022-06-30 DIAGNOSIS — E871 Hypo-osmolality and hyponatremia: Secondary | ICD-10-CM | POA: Diagnosis present

## 2022-06-30 DIAGNOSIS — S32462A Displaced associated transverse-posterior fracture of left acetabulum, initial encounter for closed fracture: Principal | ICD-10-CM | POA: Diagnosis present

## 2022-06-30 DIAGNOSIS — W109XXA Fall (on) (from) unspecified stairs and steps, initial encounter: Secondary | ICD-10-CM | POA: Diagnosis present

## 2022-06-30 DIAGNOSIS — F419 Anxiety disorder, unspecified: Secondary | ICD-10-CM | POA: Diagnosis not present

## 2022-06-30 DIAGNOSIS — Z79899 Other long term (current) drug therapy: Secondary | ICD-10-CM | POA: Diagnosis not present

## 2022-06-30 DIAGNOSIS — T148XXA Other injury of unspecified body region, initial encounter: Secondary | ICD-10-CM | POA: Diagnosis present

## 2022-06-30 DIAGNOSIS — F1020 Alcohol dependence, uncomplicated: Secondary | ICD-10-CM | POA: Diagnosis present

## 2022-06-30 DIAGNOSIS — D75838 Other thrombocytosis: Secondary | ICD-10-CM | POA: Diagnosis not present

## 2022-06-30 DIAGNOSIS — I509 Heart failure, unspecified: Secondary | ICD-10-CM | POA: Diagnosis not present

## 2022-06-30 DIAGNOSIS — D72829 Elevated white blood cell count, unspecified: Secondary | ICD-10-CM | POA: Diagnosis present

## 2022-06-30 DIAGNOSIS — S32402A Unspecified fracture of left acetabulum, initial encounter for closed fracture: Secondary | ICD-10-CM | POA: Diagnosis not present

## 2022-06-30 DIAGNOSIS — M21372 Foot drop, left foot: Secondary | ICD-10-CM | POA: Diagnosis present

## 2022-06-30 DIAGNOSIS — F1220 Cannabis dependence, uncomplicated: Secondary | ICD-10-CM | POA: Diagnosis not present

## 2022-06-30 DIAGNOSIS — Z801 Family history of malignant neoplasm of trachea, bronchus and lung: Secondary | ICD-10-CM

## 2022-06-30 DIAGNOSIS — S72009A Fracture of unspecified part of neck of unspecified femur, initial encounter for closed fracture: Principal | ICD-10-CM | POA: Diagnosis present

## 2022-06-30 DIAGNOSIS — R4182 Altered mental status, unspecified: Secondary | ICD-10-CM | POA: Diagnosis not present

## 2022-06-30 DIAGNOSIS — M898X9 Other specified disorders of bone, unspecified site: Secondary | ICD-10-CM | POA: Diagnosis present

## 2022-06-30 DIAGNOSIS — F1010 Alcohol abuse, uncomplicated: Secondary | ICD-10-CM | POA: Diagnosis not present

## 2022-06-30 DIAGNOSIS — D649 Anemia, unspecified: Secondary | ICD-10-CM | POA: Diagnosis present

## 2022-06-30 DIAGNOSIS — F10231 Alcohol dependence with withdrawal delirium: Secondary | ICD-10-CM | POA: Diagnosis present

## 2022-06-30 DIAGNOSIS — I10 Essential (primary) hypertension: Secondary | ICD-10-CM | POA: Diagnosis not present

## 2022-06-30 DIAGNOSIS — Z8249 Family history of ischemic heart disease and other diseases of the circulatory system: Secondary | ICD-10-CM

## 2022-06-30 DIAGNOSIS — R945 Abnormal results of liver function studies: Secondary | ICD-10-CM | POA: Diagnosis not present

## 2022-06-30 DIAGNOSIS — S72002A Fracture of unspecified part of neck of left femur, initial encounter for closed fracture: Secondary | ICD-10-CM | POA: Diagnosis not present

## 2022-06-30 DIAGNOSIS — Z885 Allergy status to narcotic agent status: Secondary | ICD-10-CM

## 2022-06-30 DIAGNOSIS — F17290 Nicotine dependence, other tobacco product, uncomplicated: Secondary | ICD-10-CM | POA: Diagnosis present

## 2022-06-30 DIAGNOSIS — I426 Alcoholic cardiomyopathy: Secondary | ICD-10-CM | POA: Diagnosis present

## 2022-06-30 DIAGNOSIS — Z86010 Personal history of colonic polyps: Secondary | ICD-10-CM

## 2022-06-30 DIAGNOSIS — Z8619 Personal history of other infectious and parasitic diseases: Secondary | ICD-10-CM | POA: Diagnosis not present

## 2022-06-30 DIAGNOSIS — R7989 Other specified abnormal findings of blood chemistry: Secondary | ICD-10-CM

## 2022-06-30 DIAGNOSIS — Z9889 Other specified postprocedural states: Secondary | ICD-10-CM | POA: Diagnosis not present

## 2022-06-30 DIAGNOSIS — K7200 Acute and subacute hepatic failure without coma: Secondary | ICD-10-CM | POA: Diagnosis not present

## 2022-06-30 DIAGNOSIS — S73005A Unspecified dislocation of left hip, initial encounter: Secondary | ICD-10-CM | POA: Diagnosis not present

## 2022-06-30 DIAGNOSIS — I447 Left bundle-branch block, unspecified: Secondary | ICD-10-CM | POA: Diagnosis present

## 2022-06-30 DIAGNOSIS — I110 Hypertensive heart disease with heart failure: Secondary | ICD-10-CM | POA: Diagnosis not present

## 2022-06-30 DIAGNOSIS — K219 Gastro-esophageal reflux disease without esophagitis: Secondary | ICD-10-CM | POA: Diagnosis present

## 2022-06-30 DIAGNOSIS — S32402B Unspecified fracture of left acetabulum, initial encounter for open fracture: Secondary | ICD-10-CM | POA: Diagnosis not present

## 2022-06-30 DIAGNOSIS — F1024 Alcohol dependence with alcohol-induced mood disorder: Secondary | ICD-10-CM | POA: Diagnosis not present

## 2022-06-30 DIAGNOSIS — Z510 Encounter for antineoplastic radiation therapy: Secondary | ICD-10-CM

## 2022-06-30 DIAGNOSIS — M9689 Other intraoperative and postprocedural complications and disorders of the musculoskeletal system: Secondary | ICD-10-CM | POA: Diagnosis not present

## 2022-06-30 DIAGNOSIS — F17200 Nicotine dependence, unspecified, uncomplicated: Secondary | ICD-10-CM | POA: Diagnosis not present

## 2022-06-30 DIAGNOSIS — S73192A Other sprain of left hip, initial encounter: Secondary | ICD-10-CM | POA: Diagnosis present

## 2022-06-30 DIAGNOSIS — S32422A Displaced fracture of posterior wall of left acetabulum, initial encounter for closed fracture: Secondary | ICD-10-CM | POA: Diagnosis not present

## 2022-06-30 DIAGNOSIS — W010XXA Fall on same level from slipping, tripping and stumbling without subsequent striking against object, initial encounter: Secondary | ICD-10-CM | POA: Diagnosis not present

## 2022-06-30 DIAGNOSIS — Z833 Family history of diabetes mellitus: Secondary | ICD-10-CM

## 2022-06-30 DIAGNOSIS — K5900 Constipation, unspecified: Secondary | ICD-10-CM | POA: Diagnosis not present

## 2022-06-30 DIAGNOSIS — F3171 Bipolar disorder, in partial remission, most recent episode hypomanic: Secondary | ICD-10-CM | POA: Diagnosis present

## 2022-06-30 DIAGNOSIS — R410 Disorientation, unspecified: Secondary | ICD-10-CM | POA: Diagnosis not present

## 2022-06-30 DIAGNOSIS — F331 Major depressive disorder, recurrent, moderate: Secondary | ICD-10-CM | POA: Diagnosis not present

## 2022-06-30 DIAGNOSIS — W19XXXA Unspecified fall, initial encounter: Secondary | ICD-10-CM | POA: Diagnosis not present

## 2022-06-30 DIAGNOSIS — Z806 Family history of leukemia: Secondary | ICD-10-CM

## 2022-06-30 DIAGNOSIS — F1029 Alcohol dependence with unspecified alcohol-induced disorder: Secondary | ICD-10-CM | POA: Diagnosis not present

## 2022-06-30 DIAGNOSIS — S79912A Unspecified injury of left hip, initial encounter: Secondary | ICD-10-CM | POA: Diagnosis not present

## 2022-06-30 DIAGNOSIS — E559 Vitamin D deficiency, unspecified: Secondary | ICD-10-CM | POA: Diagnosis present

## 2022-06-30 HISTORY — PX: ORIF ACETABULAR FRACTURE: SHX5029

## 2022-06-30 HISTORY — DX: Unspecified fracture of left acetabulum, initial encounter for closed fracture: S32.402A

## 2022-06-30 HISTORY — DX: Hyperlipidemia, unspecified: E78.5

## 2022-06-30 HISTORY — DX: Chronic systolic (congestive) heart failure: I50.22

## 2022-06-30 LAB — ABO/RH: ABO/RH(D): A NEG

## 2022-06-30 LAB — BASIC METABOLIC PANEL
Anion gap: 18 — ABNORMAL HIGH (ref 5–15)
BUN: 15 mg/dL (ref 6–20)
CO2: 19 mmol/L — ABNORMAL LOW (ref 22–32)
Calcium: 10 mg/dL (ref 8.9–10.3)
Chloride: 97 mmol/L — ABNORMAL LOW (ref 98–111)
Creatinine, Ser: 1.09 mg/dL (ref 0.61–1.24)
GFR, Estimated: >60 mL/min (ref >60)
Glucose, Bld: 103 mg/dL — ABNORMAL HIGH (ref 70–99)
Potassium: 3.5 mmol/L (ref 3.5–5.1)
Sodium: 134 mmol/L — ABNORMAL LOW (ref 135–145)

## 2022-06-30 LAB — CBC
HCT: 33.4 % — ABNORMAL LOW (ref 39.0–52.0)
Hemoglobin: 11.7 g/dL — ABNORMAL LOW (ref 13.0–17.0)
MCH: 32.4 pg (ref 26.0–34.0)
MCHC: 35 g/dL (ref 30.0–36.0)
MCV: 92.5 fL (ref 80.0–100.0)
Platelets: 381 10*3/uL (ref 150–400)
RBC: 3.61 MIL/uL — ABNORMAL LOW (ref 4.22–5.81)
RDW: 12.7 % (ref 11.5–15.5)
WBC: 13.3 10*3/uL — ABNORMAL HIGH (ref 4.0–10.5)
nRBC: 0 % (ref 0.0–0.2)

## 2022-06-30 LAB — COMPREHENSIVE METABOLIC PANEL
ALT: 27 U/L (ref 0–44)
AST: 24 U/L (ref 15–41)
Albumin: 4.1 g/dL (ref 3.5–5.0)
Alkaline Phosphatase: 73 U/L (ref 38–126)
Anion gap: 13 (ref 5–15)
BUN: 13 mg/dL (ref 6–20)
CO2: 20 mmol/L — ABNORMAL LOW (ref 22–32)
Calcium: 9.3 mg/dL (ref 8.9–10.3)
Chloride: 99 mmol/L (ref 98–111)
Creatinine, Ser: 0.99 mg/dL (ref 0.61–1.24)
GFR, Estimated: >60 mL/min (ref >60)
Glucose, Bld: 88 mg/dL (ref 70–99)
Potassium: 3.6 mmol/L (ref 3.5–5.1)
Sodium: 132 mmol/L — ABNORMAL LOW (ref 135–145)
Total Bilirubin: 1.1 mg/dL (ref 0.3–1.2)
Total Protein: 7 g/dL (ref 6.5–8.1)

## 2022-06-30 LAB — SURGICAL PCR SCREEN
MRSA, PCR: NEGATIVE
Staphylococcus aureus: POSITIVE — AB

## 2022-06-30 LAB — TYPE AND SCREEN
ABO/RH(D): A NEG
Antibody Screen: NEGATIVE

## 2022-06-30 LAB — HIV ANTIBODY (ROUTINE TESTING W REFLEX): HIV Screen 4th Generation wRfx: NONREACTIVE

## 2022-06-30 SURGERY — OPEN REDUCTION INTERNAL FIXATION (ORIF) ACETABULAR FRACTURE
Anesthesia: General | Site: Shoulder | Laterality: Left

## 2022-06-30 MED ORDER — CEFAZOLIN SODIUM-DEXTROSE 2-4 GM/100ML-% IV SOLN
2.0000 g | Freq: Three times a day (TID) | INTRAVENOUS | Status: AC
  Administered 2022-06-30 – 2022-07-01 (×3): 2 g via INTRAVENOUS
  Filled 2022-06-30 (×3): qty 100

## 2022-06-30 MED ORDER — BISACODYL 5 MG PO TBEC
5.0000 mg | DELAYED_RELEASE_TABLET | Freq: Every day | ORAL | Status: DC | PRN
  Administered 2022-07-07: 5 mg via ORAL
  Filled 2022-06-30: qty 1

## 2022-06-30 MED ORDER — ONDANSETRON HCL 4 MG/2ML IJ SOLN
4.0000 mg | Freq: Four times a day (QID) | INTRAMUSCULAR | Status: DC | PRN
  Administered 2022-07-02: 4 mg via INTRAVENOUS
  Filled 2022-06-30: qty 2

## 2022-06-30 MED ORDER — FENTANYL CITRATE (PF) 250 MCG/5ML IJ SOLN
INTRAMUSCULAR | Status: AC
  Filled 2022-06-30: qty 5

## 2022-06-30 MED ORDER — THIAMINE HCL 100 MG/ML IJ SOLN
100.0000 mg | Freq: Every day | INTRAMUSCULAR | Status: DC
  Filled 2022-06-30: qty 2

## 2022-06-30 MED ORDER — POTASSIUM CHLORIDE CRYS ER 10 MEQ PO TBCR
10.0000 meq | EXTENDED_RELEASE_TABLET | Freq: Every day | ORAL | Status: DC
  Administered 2022-06-30 – 2022-07-10 (×11): 10 meq via ORAL
  Filled 2022-06-30 (×11): qty 1

## 2022-06-30 MED ORDER — TRANEXAMIC ACID-NACL 1000-0.7 MG/100ML-% IV SOLN
1000.0000 mg | INTRAVENOUS | Status: AC
  Administered 2022-06-30: 1000 mg via INTRAVENOUS

## 2022-06-30 MED ORDER — ACETAMINOPHEN 500 MG PO TABS: 1000.0000 mg | ORAL_TABLET | Freq: Once | ORAL | Status: AC

## 2022-06-30 MED ORDER — ALBUMIN HUMAN 5 % IV SOLN: INTRAVENOUS | Status: DC | PRN

## 2022-06-30 MED ORDER — ACETAMINOPHEN 160 MG/5ML PO SOLN
325.0000 mg | ORAL | Status: DC | PRN
Start: 2022-06-30 — End: 2022-06-30

## 2022-06-30 MED ORDER — SODIUM CHLORIDE 0.9 % IR SOLN
Status: DC | PRN
  Administered 2022-06-30: 3000 mL

## 2022-06-30 MED ORDER — DEXAMETHASONE SODIUM PHOSPHATE 10 MG/ML IJ SOLN
INTRAMUSCULAR | Status: DC | PRN
  Administered 2022-06-30: 10 mg via INTRAVENOUS

## 2022-06-30 MED ORDER — OXYCODONE HCL 5 MG/5ML PO SOLN
5.0000 mg | Freq: Once | ORAL | Status: DC | PRN
Start: 2022-06-30 — End: 2022-06-30

## 2022-06-30 MED ORDER — GABAPENTIN 100 MG PO CAPS
100.0000 mg | ORAL_CAPSULE | Freq: Two times a day (BID) | ORAL | Status: DC
  Administered 2022-06-30 – 2022-07-10 (×20): 100 mg via ORAL
  Filled 2022-06-30 (×20): qty 1

## 2022-06-30 MED ORDER — ROCURONIUM BROMIDE 10 MG/ML (PF) SYRINGE
PREFILLED_SYRINGE | INTRAVENOUS | Status: AC
  Filled 2022-06-30: qty 40

## 2022-06-30 MED ORDER — EPHEDRINE 5 MG/ML INJ
INTRAVENOUS | Status: AC
  Filled 2022-06-30: qty 5

## 2022-06-30 MED ORDER — MIDAZOLAM HCL 2 MG/2ML IJ SOLN
INTRAMUSCULAR | Status: DC | PRN
  Administered 2022-06-30 (×2): 1 mg via INTRAVENOUS

## 2022-06-30 MED ORDER — LOSARTAN POTASSIUM 50 MG PO TABS
100.0000 mg | ORAL_TABLET | Freq: Every day | ORAL | Status: DC
  Administered 2022-06-30 – 2022-07-01 (×2): 100 mg via ORAL
  Filled 2022-06-30 (×2): qty 2

## 2022-06-30 MED ORDER — ADULT MULTIVITAMIN W/MINERALS CH
1.0000 | ORAL_TABLET | Freq: Every day | ORAL | Status: DC
  Administered 2022-07-01 – 2022-07-10 (×10): 1 via ORAL
  Filled 2022-06-30 (×10): qty 1

## 2022-06-30 MED ORDER — CELECOXIB 200 MG PO CAPS: 200.0000 mg | ORAL_CAPSULE | Freq: Once | ORAL | Status: AC

## 2022-06-30 MED ORDER — HYDROMORPHONE HCL 1 MG/ML IJ SOLN
0.5000 mg | Freq: Once | INTRAMUSCULAR | Status: AC
  Administered 2022-06-30: 0.5 mg via INTRAVENOUS
  Filled 2022-06-30: qty 0.5

## 2022-06-30 MED ORDER — METOPROLOL SUCCINATE ER 25 MG PO TB24
25.0000 mg | ORAL_TABLET | Freq: Every day | ORAL | Status: DC
  Administered 2022-06-30 – 2022-07-10 (×11): 25 mg via ORAL
  Filled 2022-06-30 (×11): qty 1

## 2022-06-30 MED ORDER — BUSPIRONE HCL 5 MG PO TABS
5.0000 mg | ORAL_TABLET | Freq: Two times a day (BID) | ORAL | Status: DC
  Administered 2022-06-30 – 2022-07-10 (×20): 5 mg via ORAL
  Filled 2022-06-30 (×20): qty 1

## 2022-06-30 MED ORDER — THIAMINE MONONITRATE 100 MG PO TABS
100.0000 mg | ORAL_TABLET | Freq: Every day | ORAL | Status: DC
  Administered 2022-07-01 – 2022-07-10 (×10): 100 mg via ORAL
  Filled 2022-06-30 (×10): qty 1

## 2022-06-30 MED ORDER — OXYCODONE HCL 5 MG PO TABS
5.0000 mg | ORAL_TABLET | ORAL | Status: DC | PRN
  Administered 2022-06-30 – 2022-07-02 (×7): 15 mg via ORAL
  Filled 2022-06-30 (×7): qty 3

## 2022-06-30 MED ORDER — TRANEXAMIC ACID-NACL 1000-0.7 MG/100ML-% IV SOLN
INTRAVENOUS | Status: AC
  Filled 2022-06-30: qty 100

## 2022-06-30 MED ORDER — MORPHINE SULFATE (PF) 2 MG/ML IV SOLN
2.0000 mg | INTRAVENOUS | Status: DC | PRN
  Administered 2022-07-01 – 2022-07-02 (×2): 2 mg via INTRAVENOUS
  Filled 2022-06-30 (×2): qty 1

## 2022-06-30 MED ORDER — LOSARTAN POTASSIUM-HCTZ 100-25 MG PO TABS: 1.0000 | ORAL_TABLET | Freq: Every day | ORAL | Status: DC

## 2022-06-30 MED ORDER — ROCURONIUM BROMIDE 10 MG/ML (PF) SYRINGE
PREFILLED_SYRINGE | INTRAVENOUS | Status: DC | PRN
  Administered 2022-06-30: 20 mg via INTRAVENOUS
  Administered 2022-06-30: 80 mg via INTRAVENOUS
  Administered 2022-06-30: 20 mg via INTRAVENOUS

## 2022-06-30 MED ORDER — MIDAZOLAM HCL 2 MG/2ML IJ SOLN
INTRAMUSCULAR | Status: AC
  Filled 2022-06-30: qty 2

## 2022-06-30 MED ORDER — ONDANSETRON HCL 4 MG/2ML IJ SOLN
INTRAMUSCULAR | Status: AC
  Filled 2022-06-30: qty 2

## 2022-06-30 MED ORDER — ACETAMINOPHEN 500 MG PO TABS
ORAL_TABLET | ORAL | Status: AC
  Administered 2022-06-30: 1000 mg via ORAL
  Filled 2022-06-30: qty 2

## 2022-06-30 MED ORDER — PROPOFOL 10 MG/ML IV BOLUS
0.5000 mg/kg | Freq: Once | INTRAVENOUS | Status: AC
  Administered 2022-06-30: 38.6 mg via INTRAVENOUS
  Filled 2022-06-30: qty 20

## 2022-06-30 MED ORDER — HYDROMORPHONE HCL 1 MG/ML IJ SOLN
1.0000 mg | Freq: Once | INTRAMUSCULAR | Status: AC
  Administered 2022-06-30: 1 mg via INTRAVENOUS
  Filled 2022-06-30: qty 1

## 2022-06-30 MED ORDER — METHOCARBAMOL 1000 MG/10ML IJ SOLN
1000.0000 mg | Freq: Four times a day (QID) | INTRAVENOUS | Status: DC | PRN
  Filled 2022-06-30: qty 10

## 2022-06-30 MED ORDER — LORAZEPAM 1 MG PO TABS
1.0000 mg | ORAL_TABLET | ORAL | Status: AC | PRN
  Administered 2022-06-30: 1 mg via ORAL
  Administered 2022-07-01: 2 mg via ORAL
  Administered 2022-07-01: 1 mg via ORAL
  Administered 2022-07-01: 2 mg via ORAL
  Filled 2022-06-30: qty 1
  Filled 2022-06-30 (×2): qty 2
  Filled 2022-06-30: qty 1

## 2022-06-30 MED ORDER — CEFAZOLIN SODIUM-DEXTROSE 2-4 GM/100ML-% IV SOLN
2.0000 g | INTRAVENOUS | Status: AC
  Administered 2022-06-30: 2 g via INTRAVENOUS

## 2022-06-30 MED ORDER — POTASSIUM CHLORIDE CRYS ER 10 MEQ PO TBCR: 10.0000 meq | EXTENDED_RELEASE_TABLET | Freq: Every day | ORAL | Status: DC

## 2022-06-30 MED ORDER — PROPOFOL 10 MG/ML IV BOLUS
INTRAVENOUS | Status: DC | PRN
  Administered 2022-06-30: 130 mg via INTRAVENOUS

## 2022-06-30 MED ORDER — PHENYLEPHRINE 80 MCG/ML (10ML) SYRINGE FOR IV PUSH (FOR BLOOD PRESSURE SUPPORT)
PREFILLED_SYRINGE | INTRAVENOUS | Status: DC | PRN
  Administered 2022-06-30 (×2): 80 ug via INTRAVENOUS

## 2022-06-30 MED ORDER — PANTOPRAZOLE SODIUM 40 MG PO TBEC
40.0000 mg | DELAYED_RELEASE_TABLET | Freq: Every day | ORAL | Status: DC
  Administered 2022-06-30 – 2022-07-10 (×11): 40 mg via ORAL
  Filled 2022-06-30 (×10): qty 1

## 2022-06-30 MED ORDER — CELECOXIB 200 MG PO CAPS
ORAL_CAPSULE | ORAL | Status: AC
  Administered 2022-06-30: 200 mg via ORAL
  Filled 2022-06-30: qty 1

## 2022-06-30 MED ORDER — ONDANSETRON HCL 4 MG/2ML IJ SOLN: 4.0000 mg | Freq: Once | INTRAMUSCULAR | Status: DC | PRN

## 2022-06-30 MED ORDER — DOCUSATE SODIUM 100 MG PO CAPS: 100.0000 mg | ORAL_CAPSULE | Freq: Two times a day (BID) | ORAL | Status: DC

## 2022-06-30 MED ORDER — ONDANSETRON HCL 4 MG PO TABS
4.0000 mg | ORAL_TABLET | Freq: Four times a day (QID) | ORAL | Status: DC | PRN
  Administered 2022-07-05 – 2022-07-08 (×3): 4 mg via ORAL
  Filled 2022-06-30 (×4): qty 1

## 2022-06-30 MED ORDER — ACETAMINOPHEN 325 MG PO TABS: 325.0000 mg | ORAL_TABLET | ORAL | Status: DC | PRN

## 2022-06-30 MED ORDER — PROPOFOL 10 MG/ML IV BOLUS
INTRAVENOUS | Status: AC
  Filled 2022-06-30: qty 20

## 2022-06-30 MED ORDER — MORPHINE SULFATE (PF) 4 MG/ML IV SOLN
4.0000 mg | Freq: Once | INTRAVENOUS | Status: AC
  Administered 2022-06-30: 4 mg via INTRAVENOUS
  Filled 2022-06-30: qty 1

## 2022-06-30 MED ORDER — PROPOFOL 10 MG/ML IV BOLUS
1.0000 mg/kg | Freq: Once | INTRAVENOUS | Status: AC
  Administered 2022-06-30: 77.1 mg via INTRAVENOUS
  Filled 2022-06-30: qty 20

## 2022-06-30 MED ORDER — BUPROPION HCL ER (XL) 150 MG PO TB24
300.0000 mg | ORAL_TABLET | Freq: Every day | ORAL | Status: DC
  Administered 2022-06-30 – 2022-07-10 (×11): 300 mg via ORAL
  Filled 2022-06-30 (×11): qty 2

## 2022-06-30 MED ORDER — HYDROCHLOROTHIAZIDE 25 MG PO TABS
25.0000 mg | ORAL_TABLET | Freq: Every day | ORAL | Status: DC
  Administered 2022-06-30: 25 mg via ORAL
  Filled 2022-06-30: qty 1

## 2022-06-30 MED ORDER — PANTOPRAZOLE SODIUM 40 MG PO TBEC: 40.0000 mg | DELAYED_RELEASE_TABLET | Freq: Every day | ORAL | Status: DC

## 2022-06-30 MED ORDER — PROPOFOL 10 MG/ML IV BOLUS
74.0000 mg | Freq: Once | INTRAVENOUS | Status: AC
  Administered 2022-06-30: 74 mg via INTRAVENOUS

## 2022-06-30 MED ORDER — PRAVASTATIN SODIUM 40 MG PO TABS
40.0000 mg | ORAL_TABLET | Freq: Every day | ORAL | Status: DC
  Administered 2022-06-30 – 2022-07-06 (×7): 40 mg via ORAL
  Filled 2022-06-30 (×7): qty 1

## 2022-06-30 MED ORDER — METOCLOPRAMIDE HCL 5 MG PO TABS
5.0000 mg | ORAL_TABLET | Freq: Three times a day (TID) | ORAL | Status: DC | PRN
  Administered 2022-07-08: 10 mg via ORAL
  Filled 2022-06-30: qty 2

## 2022-06-30 MED ORDER — SUGAMMADEX SODIUM 200 MG/2ML IV SOLN
INTRAVENOUS | Status: DC | PRN
  Administered 2022-06-30: 200 mg via INTRAVENOUS

## 2022-06-30 MED ORDER — DEXAMETHASONE SODIUM PHOSPHATE 10 MG/ML IJ SOLN
INTRAMUSCULAR | Status: AC
  Filled 2022-06-30: qty 1

## 2022-06-30 MED ORDER — ONDANSETRON HCL 4 MG/2ML IJ SOLN
INTRAMUSCULAR | Status: AC
  Filled 2022-06-30: qty 4

## 2022-06-30 MED ORDER — FENTANYL CITRATE (PF) 250 MCG/5ML IJ SOLN
INTRAMUSCULAR | Status: DC | PRN
  Administered 2022-06-30 (×2): 100 ug via INTRAVENOUS
  Administered 2022-06-30 (×2): 50 ug via INTRAVENOUS
  Administered 2022-06-30: 100 ug via INTRAVENOUS
  Administered 2022-06-30 (×2): 50 ug via INTRAVENOUS

## 2022-06-30 MED ORDER — ONDANSETRON HCL 4 MG/2ML IJ SOLN
4.0000 mg | Freq: Four times a day (QID) | INTRAMUSCULAR | Status: DC | PRN
Start: 2022-06-30 — End: 2022-06-30

## 2022-06-30 MED ORDER — SCOPOLAMINE 1 MG/3DAYS TD PT72
MEDICATED_PATCH | TRANSDERMAL | Status: AC
  Filled 2022-06-30: qty 1

## 2022-06-30 MED ORDER — FENTANYL CITRATE (PF) 100 MCG/2ML IJ SOLN
INTRAMUSCULAR | Status: AC
  Filled 2022-06-30: qty 2

## 2022-06-30 MED ORDER — MEPERIDINE HCL 25 MG/ML IJ SOLN
6.2500 mg | INTRAMUSCULAR | Status: DC | PRN
Start: 2022-06-30 — End: 2022-06-30

## 2022-06-30 MED ORDER — PHENYLEPHRINE 80 MCG/ML (10ML) SYRINGE FOR IV PUSH (FOR BLOOD PRESSURE SUPPORT)
PREFILLED_SYRINGE | INTRAVENOUS | Status: AC
  Filled 2022-06-30: qty 20

## 2022-06-30 MED ORDER — CEFAZOLIN SODIUM-DEXTROSE 2-4 GM/100ML-% IV SOLN
INTRAVENOUS | Status: AC
  Filled 2022-06-30: qty 100

## 2022-06-30 MED ORDER — OXYCODONE HCL 5 MG PO TABS: 5.0000 mg | ORAL_TABLET | ORAL | Status: DC | PRN

## 2022-06-30 MED ORDER — POVIDONE-IODINE 10 % EX SWAB
2.0000 | Freq: Once | CUTANEOUS | Status: AC
  Administered 2022-06-30: 2 via TOPICAL

## 2022-06-30 MED ORDER — ORAL CARE MOUTH RINSE: 15.0000 mL | Freq: Once | OROMUCOSAL | Status: AC

## 2022-06-30 MED ORDER — AMLODIPINE BESYLATE 10 MG PO TABS: 10.0000 mg | ORAL_TABLET | Freq: Every day | ORAL | Status: DC

## 2022-06-30 MED ORDER — AMLODIPINE BESYLATE 10 MG PO TABS
10.0000 mg | ORAL_TABLET | Freq: Every day | ORAL | Status: DC
  Administered 2022-06-30 – 2022-07-10 (×11): 10 mg via ORAL
  Filled 2022-06-30 (×11): qty 1

## 2022-06-30 MED ORDER — FLUOXETINE HCL 20 MG PO CAPS
20.0000 mg | ORAL_CAPSULE | Freq: Every day | ORAL | Status: DC
  Administered 2022-06-30 – 2022-07-10 (×11): 20 mg via ORAL
  Filled 2022-06-30 (×11): qty 1

## 2022-06-30 MED ORDER — MAGNESIUM OXIDE -MG SUPPLEMENT 400 (240 MG) MG PO TABS
400.0000 mg | ORAL_TABLET | Freq: Two times a day (BID) | ORAL | Status: DC
  Administered 2022-06-30 – 2022-07-10 (×20): 400 mg via ORAL
  Filled 2022-06-30 (×20): qty 1

## 2022-06-30 MED ORDER — LIDOCAINE 2% (20 MG/ML) 5 ML SYRINGE
INTRAMUSCULAR | Status: AC
  Filled 2022-06-30: qty 15

## 2022-06-30 MED ORDER — FENTANYL CITRATE (PF) 100 MCG/2ML IJ SOLN
100.0000 ug | Freq: Once | INTRAMUSCULAR | Status: AC
  Administered 2022-06-30: 100 ug via INTRAVENOUS

## 2022-06-30 MED ORDER — ONDANSETRON HCL 4 MG/2ML IJ SOLN
INTRAMUSCULAR | Status: DC | PRN
  Administered 2022-06-30: 4 mg via INTRAVENOUS

## 2022-06-30 MED ORDER — OXYCODONE HCL 5 MG PO TABS
5.0000 mg | ORAL_TABLET | Freq: Once | ORAL | Status: DC | PRN
Start: 2022-06-30 — End: 2022-06-30

## 2022-06-30 MED ORDER — POLYETHYLENE GLYCOL 3350 17 G PO PACK
17.0000 g | PACK | Freq: Every day | ORAL | Status: DC | PRN
  Administered 2022-07-06: 17 g via ORAL
  Filled 2022-06-30: qty 1

## 2022-06-30 MED ORDER — DEXAMETHASONE SODIUM PHOSPHATE 10 MG/ML IJ SOLN
INTRAMUSCULAR | Status: AC
  Filled 2022-06-30: qty 2

## 2022-06-30 MED ORDER — FENTANYL CITRATE (PF) 100 MCG/2ML IJ SOLN
25.0000 ug | INTRAMUSCULAR | Status: DC | PRN
  Administered 2022-06-30: 50 ug via INTRAVENOUS
  Administered 2022-06-30 (×2): 25 ug via INTRAVENOUS

## 2022-06-30 MED ORDER — MAGNESIUM OXIDE 400 MG PO CAPS: 400.0000 mg | ORAL_CAPSULE | Freq: Two times a day (BID) | ORAL | Status: DC

## 2022-06-30 MED ORDER — FLUOXETINE HCL 20 MG PO CAPS: 20.0000 mg | ORAL_CAPSULE | Freq: Every day | ORAL | Status: DC

## 2022-06-30 MED ORDER — CHLORHEXIDINE GLUCONATE 4 % EX SOLN: 60.0000 mL | Freq: Once | CUTANEOUS | Status: DC

## 2022-06-30 MED ORDER — DOCUSATE SODIUM 100 MG PO CAPS
100.0000 mg | ORAL_CAPSULE | Freq: Two times a day (BID) | ORAL | Status: DC
  Administered 2022-07-01 – 2022-07-10 (×19): 100 mg via ORAL
  Filled 2022-06-30 (×19): qty 1

## 2022-06-30 MED ORDER — PRAVASTATIN SODIUM 40 MG PO TABS: 40.0000 mg | ORAL_TABLET | Freq: Every day | ORAL | Status: DC

## 2022-06-30 MED ORDER — CHLORHEXIDINE GLUCONATE 0.12 % MT SOLN
15.0000 mL | Freq: Once | OROMUCOSAL | Status: AC
  Administered 2022-06-30: 15 mL via OROMUCOSAL
  Filled 2022-06-30: qty 15

## 2022-06-30 MED ORDER — 0.9 % SODIUM CHLORIDE (POUR BTL) OPTIME
TOPICAL | Status: DC | PRN
  Administered 2022-06-30: 1000 mL

## 2022-06-30 MED ORDER — FOLIC ACID 1 MG PO TABS
1.0000 mg | ORAL_TABLET | Freq: Every day | ORAL | Status: DC
  Administered 2022-07-01 – 2022-07-10 (×10): 1 mg via ORAL
  Filled 2022-06-30 (×10): qty 1

## 2022-06-30 MED ORDER — LIDOCAINE 2% (20 MG/ML) 5 ML SYRINGE
INTRAMUSCULAR | Status: DC | PRN
  Administered 2022-06-30: 60 mg via INTRAVENOUS

## 2022-06-30 MED ORDER — LACTATED RINGERS IV SOLN: INTRAVENOUS | Status: DC

## 2022-06-30 MED ORDER — SCOPOLAMINE 1 MG/3DAYS TD PT72
MEDICATED_PATCH | TRANSDERMAL | Status: DC | PRN
  Administered 2022-06-30: 1 via TRANSDERMAL

## 2022-06-30 MED ORDER — METOCLOPRAMIDE HCL 5 MG/ML IJ SOLN: 5.0000 mg | Freq: Three times a day (TID) | INTRAMUSCULAR | Status: DC | PRN

## 2022-06-30 MED ORDER — BUPROPION HCL ER (XL) 150 MG PO TB24: 300.0000 mg | ORAL_TABLET | Freq: Every day | ORAL | Status: DC

## 2022-06-30 MED ORDER — LORAZEPAM 2 MG/ML IJ SOLN
1.0000 mg | INTRAMUSCULAR | Status: AC | PRN
  Administered 2022-06-30 – 2022-07-02 (×5): 2 mg via INTRAVENOUS
  Administered 2022-07-02 (×2): 4 mg via INTRAVENOUS
  Administered 2022-07-02: 2 mg via INTRAVENOUS
  Filled 2022-06-30 (×3): qty 1
  Filled 2022-06-30: qty 2
  Filled 2022-06-30 (×2): qty 1
  Filled 2022-06-30: qty 2
  Filled 2022-06-30: qty 1

## 2022-06-30 MED ORDER — METOPROLOL SUCCINATE ER 25 MG PO TB24: 25.0000 mg | ORAL_TABLET | Freq: Every day | ORAL | Status: DC

## 2022-06-30 MED ORDER — ENOXAPARIN SODIUM 40 MG/0.4ML IJ SOSY
40.0000 mg | PREFILLED_SYRINGE | INTRAMUSCULAR | Status: DC
  Administered 2022-07-01 – 2022-07-09 (×9): 40 mg via SUBCUTANEOUS
  Filled 2022-06-30 (×9): qty 0.4

## 2022-06-30 SURGICAL SUPPLY — 64 items
BAG COUNTER SPONGE SURGICOUNT (BAG) ×1 IMPLANT
BIT DRILL AO MATTA 2.5MX230M (BIT) IMPLANT
BRUSH SCRUB EZ PLAIN DRY (MISCELLANEOUS) ×2 IMPLANT
COVER SURGICAL LIGHT HANDLE (MISCELLANEOUS) ×1 IMPLANT
DRAPE C-ARM 42X72 X-RAY (DRAPES) ×1 IMPLANT
DRAPE C-ARMOR (DRAPES) ×1 IMPLANT
DRAPE INCISE IOBAN 66X45 STRL (DRAPES) ×1 IMPLANT
DRAPE INCISE IOBAN 85X60 (DRAPES) ×1 IMPLANT
DRAPE ORTHO SPLIT 77X108 STRL (DRAPES) ×2
DRAPE SURG ORHT 6 SPLT 77X108 (DRAPES) ×2 IMPLANT
DRAPE U-SHAPE 47X51 STRL (DRAPES) ×1 IMPLANT
DRILL BIT AO MATTA 2.5MX230M (BIT) ×2
DRSG MEPILEX POST OP 4X8 (GAUZE/BANDAGES/DRESSINGS) IMPLANT
ELECT BLADE 6.5 EXT (BLADE) ×1 IMPLANT
ELECT REM PT RETURN 9FT ADLT (ELECTROSURGICAL) ×1
ELECTRODE REM PT RTRN 9FT ADLT (ELECTROSURGICAL) ×1 IMPLANT
GLOVE BIO SURGEON STRL SZ7.5 (GLOVE) ×1 IMPLANT
GLOVE BIO SURGEON STRL SZ8 (GLOVE) ×1 IMPLANT
GLOVE BIOGEL PI IND STRL 7.5 (GLOVE) ×1 IMPLANT
GLOVE BIOGEL PI IND STRL 8 (GLOVE) ×1 IMPLANT
GLOVE SURG ORTHO LTX SZ7.5 (GLOVE) ×2 IMPLANT
GOWN STRL REUS W/ TWL LRG LVL3 (GOWN DISPOSABLE) ×2 IMPLANT
GOWN STRL REUS W/ TWL XL LVL3 (GOWN DISPOSABLE) ×2 IMPLANT
GOWN STRL REUS W/TWL LRG LVL3 (GOWN DISPOSABLE) ×2
GOWN STRL REUS W/TWL XL LVL3 (GOWN DISPOSABLE) ×2
HANDPIECE INTERPULSE COAX TIP (DISPOSABLE) ×1
KIT BASIN OR (CUSTOM PROCEDURE TRAY) ×1 IMPLANT
KIT TURNOVER KIT B (KITS) ×1 IMPLANT
MANIFOLD NEPTUNE II (INSTRUMENTS) ×1 IMPLANT
NS IRRIG 1000ML POUR BTL (IV SOLUTION) ×1 IMPLANT
PACK TOTAL JOINT (CUSTOM PROCEDURE TRAY) ×1 IMPLANT
PAD ARMBOARD 7.5X6 YLW CONV (MISCELLANEOUS) ×2 IMPLANT
PIN APEX 6X180MM EXFIX (EXFIX) IMPLANT
PLATE ACET STRT 46.5M 4H (Plate) IMPLANT
PLATE ACET STRT 94.5M 8H (Plate) IMPLANT
RETRIEVER SUT HEWSON (MISCELLANEOUS) ×1 IMPLANT
SCREW CORT ST 4.5X24 (Screw) IMPLANT
SCREW CORTEX ST MATTA 3.5X20 (Screw) IMPLANT
SCREW CORTEX ST MATTA 3.5X24 (Screw) IMPLANT
SCREW CORTEX ST MATTA 3.5X26MM (Screw) IMPLANT
SCREW CORTEX ST MATTA 3.5X28MM (Screw) IMPLANT
SCREW CORTEX ST MATTA 3.5X30MM (Screw) IMPLANT
SCREW CORTEX ST MATTA 3.5X32MM (Screw) IMPLANT
SCREW CORTEX ST MATTA 3.5X55MM (Screw) IMPLANT
SCREW CORTICAL 3.5X42MM (Screw) IMPLANT
SET HNDPC FAN SPRY TIP SCT (DISPOSABLE) ×1 IMPLANT
STAPLER VISISTAT 35W (STAPLE) ×1 IMPLANT
STRIP CLOSURE SKIN 1/2X4 (GAUZE/BANDAGES/DRESSINGS) ×1 IMPLANT
SUCTION FRAZIER HANDLE 10FR (MISCELLANEOUS) ×1
SUCTION TUBE FRAZIER 10FR DISP (MISCELLANEOUS) ×1 IMPLANT
SUT ETHILON 2 0 PSLX (SUTURE) ×2 IMPLANT
SUT FIBERWIRE #2 38 T-5 BLUE (SUTURE) ×2
SUT VIC AB 0 CT1 27 (SUTURE) ×1
SUT VIC AB 0 CT1 27XBRD ANBCTR (SUTURE) ×1 IMPLANT
SUT VIC AB 1 CT1 18XCR BRD 8 (SUTURE) ×1 IMPLANT
SUT VIC AB 1 CT1 27 (SUTURE) ×1
SUT VIC AB 1 CT1 27XBRD ANBCTR (SUTURE) ×1 IMPLANT
SUT VIC AB 1 CT1 8-18 (SUTURE) ×1
SUT VIC AB 2-0 CT1 27 (SUTURE) ×1
SUT VIC AB 2-0 CT1 TAPERPNT 27 (SUTURE) ×1 IMPLANT
SUTURE FIBERWR #2 38 T-5 BLUE (SUTURE) ×2 IMPLANT
TOWEL GREEN STERILE (TOWEL DISPOSABLE) ×2 IMPLANT
TOWEL GREEN STERILE FF (TOWEL DISPOSABLE) ×2 IMPLANT
WATER STERILE IRR 1000ML POUR (IV SOLUTION) IMPLANT

## 2022-06-30 NOTE — H&P (Signed)
History and Physical    Patient: Thomas Williamson ZOX:[id number] DOB: [birth year] DOA: 06/30/2022 DOS: the patient was seen and examined on 06/30/2022 PCP: Nelwyn Salisbury, MD  Patient coming from: Home - lives with parents, recently "moved back"; NOK:  Parents, [phone]   Chief Complaint: Fall  HPI: Thomas Williamson is a 47 y.o. male with medical history significant of ETOH use d/o, bipolar d/o, chronic systolic CHF, and HTN presenting with a fall.  He reports that he was coming down the steps with his dog.  The dog stopped and the patient pivoted and he tripped over the dog down the rest of the stairs.  Only pain in hip, nowhere else.  He does note that he drank "a double" last night prior to the fall but that "it didn't contribute" to the fall.  He recently returned from rehab and now drinks only a few drinks daily.    ER Course:  [facility] to [facility] transfer, per Dr. Imogene Burn:  47 yo WM fell down flight of stairs, presents to ER with hip pain. found to have left acetabular fracture and hip location.  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 EDP spoke with [place]).      Review of Systems: As mentioned in the history of present illness. All other systems reviewed and are negative. Past Medical History:  Diagnosis Date   Alcohol use disorder, severe, in early remission (HCC) 03/30/2018   Allergy    Anxiety    Bipolar disorder, in partial remission, most recent episode hypomanic (HCC) 03/30/2018   [facility] Dr[facility]a [facility]   Bleeding internal hemorrhoids 09/12/2013   Cannabis dependence (HCC) 07/13/2018   Daily use   Cervical disc disease 11/20/2014   Chronic anxiety 07/13/2018   Chronic systolic (congestive) heart failure (HCC)    Complication of anesthesia    Depression    Dyslipidemia    GERD (gastroesophageal reflux disease)    Hernia, inguinal, bilateral 02/24/2011   High ankle sprain of right lower extremity 01/16/2015   History of hiatal hernia    Hx of  hepatitis C 10/05/2017   -treated in 2019 -hepatology recommended no further surveillance needed except for LFTs with labs and see hepatologist if elevated   Hypertension    Lipids abnormal 09/29/2013   Pneumonia    PONV (postoperative nausea and vomiting)    Past Surgical History:  Procedure Laterality Date   ANTERIOR CRUCIATE LIGAMENT REPAIR  2010   BIOPSY  03/13/2019   Procedure: BIOPSY;  Surgeon: Benancio Deeds, MD;  Location: [facility];  Service: Gastroenterology;;   COLONOSCOPY N/A 03/13/2019   per Dr. Adela Lank, adenomatous polyps, repeat in 3 yrs   INGUINAL HERNIA REPAIR  02/23/2012   Procedure: LAPAROSCOPIC BILATERAL INGUINAL HERNIA REPAIR;  Surgeon: Kandis Cocking, MD;  Location: [facility];  Service: General;  Laterality: Bilateral;  Laparoscopic Bilateral Inguinal Hernia Repair with mesh   INSERTION OF MESH  02/23/2012   Procedure: INSERTION OF MESH;  Surgeon: Kandis Cocking, MD;  Location: [facility];  Service: General;  Laterality: N/A;   NOSE SURGERY  [id number]   POLYPECTOMY  03/13/2019   Procedure: POLYPECTOMY;  Surgeon: Benancio Deeds, MD;  Location: [facility];  Service: Gastroenterology;;   Social History:  reports that he has been smoking e-cigarettes. He has never used smokeless tobacco. He reports current alcohol use of about 4.0 standard drinks of alcohol per week. He reports current drug use. Frequency: 2.00 times per week. Drug: Marijuana.  Allergies  Allergen Reactions   Hydrocodone     Can tolerate    Family History  Problem Relation Age of Onset   Hypertension Mother    Atrial fibrillation Mother    Cancer Father    Hypertension Father    Leukemia Father 78   Prostate cancer Father 33   Diabetes Father    Diabetes Brother    Other Brother        growth on finger   Learning disabilities Paternal Aunt    Lung cancer Maternal Grandmother    Heart attack Maternal Grandfather    Congestive Heart Failure Paternal Grandmother    Colon  cancer Neg Hx     Prior to Admission medications   Medication Sig Start Date End Date Taking? Authorizing Provider  amLODipine (NORVASC) 10 MG tablet Take 1 tablet (10 mg total) by mouth daily. 03/31/22   Nelwyn Salisbury, MD  buPROPion (WELLBUTRIN XL) 300 MG 24 hr tablet Take 1 tablet (300 mg total) by mouth daily. 06/25/20   Patrick North, MD  busPIRone (BUSPAR) 5 MG tablet Take 1 tablet (5 mg total) by mouth 2 (two) times daily. 06/18/22   Nelwyn Salisbury, MD  FLUoxetine (PROZAC) 20 MG capsule Take 20 mg by mouth daily. 02/12/22   [provider]  folic acid (FOLVITE) 1 MG tablet Take 1 tablet (1 mg total) by mouth daily. 02/19/22 02/14/23  Nelwyn Salisbury, MD  gabapentin (NEURONTIN) 100 MG capsule Take 100 mg by mouth 3 (three) times daily. 12/18/21   [provider]  LORazepam (ATIVAN) 1 MG tablet Take 1 tablet (1 mg total) by mouth every 8 (eight) hours as needed for anxiety. 06/18/22   Nelwyn Salisbury, MD  losartan (COZAAR) 100 MG tablet Take 100 mg by mouth daily.    [provider]  losartan-hydrochlorothiazide (HYZAAR) 100-25 MG tablet TAKE 1 TABLET BY MOUTH EVERY DAY 06/22/22   Nelwyn Salisbury, MD  Magnesium Oxide 400 MG CAPS Take 1 capsule (400 mg total) by mouth 2 (two) times daily. 02/18/22   Nelwyn Salisbury, MD  metoprolol succinate (TOPROL-XL) 25 MG 24 hr tablet Take 1 tablet (25 mg total) by mouth daily. 02/23/22 08/22/22  Nelwyn Salisbury, MD  naltrexone (DEPADE) 50 MG tablet Take 50 mg by mouth daily. 04/27/22   [provider]  Omeprazole 20 MG TBEC Take 20 mg by mouth daily.     [provider]  potassium chloride (KLOR-CON M) 10 MEQ tablet Take 1 tablet (10 mEq total) by mouth daily. 01/07/22   Nelwyn Salisbury, MD  pravastatin (PRAVACHOL) 40 MG tablet TAKE 1 TABLET BY MOUTH EVERY DAY 06/22/22   Nelwyn Salisbury, MD  thiamine (VITAMIN B1) 100 MG tablet Take 1 tablet (100 mg total) by mouth daily. 02/19/22   Nelwyn Salisbury, MD  valACYclovir (VALTREX) 1000  MG tablet Take 1 tablet (1,000 mg total) by mouth 2 (two) times daily as needed (fever blisters). 09/16/18   Nelwyn Salisbury, MD    Physical Exam: Vitals:   06/30/22 1019  BP: (!) 136/93  Pulse: (!) 109  Resp: 18  Temp: 98 F (36.7 C)  TempSrc: Oral  SpO2: 100%  Weight: 77.1 kg  Height: 5\' 9"  (1.753 m)   General:  Appears calm and comfortable and is in NAD; hip pain with movement; mildly anxious-appearing (denies feeling like he is having withdrawal symptoms) Eyes:  EOMI, normal lids, iris ENT: grossly normal hearing,  grossly normal lips &  tongue, mmm Neck:  no LAD, masses or thyromegaly Cardiovascular:  RRR, no m/r/g. No LE edema.  Respiratory:   CTA bilaterally with no wheezes/rales/rhonchi.  Normal respiratory effort.   Abdomen:  soft, NT, ND Skin:  no rash or induration seen on limited exam Musculoskeletal:  R leg is shortened and externally rotated Lower extremity:  No LE edema.  Limited foot exam with no ulcerations.  2+ distal pulses. Psychiatric:  grossly normal mood and affect, speech fluent and appropriate, A&O x 3 Neurologic:  CN 2-12 grossly intact, moves all extremities in coordinated fashion other than RLE   Radiological Exams on Admission: Independently reviewed - see discussion in A/P where applicable  DG HIP UNILAT WITH PELVIS 1V LEFT  Result Date: 06/30/2022 CLINICAL DATA:  Postoperative status. EXAM: DG HIP (WITH OR WITHOUT PELVIS) 1V*L* COMPARISON:  CT scan of same day. FINDINGS: No dislocation is noted. There appears to be improved alignment involving the posterior acetabular fracture noted on prior CT scan. No other bony abnormality is noted. IMPRESSION: No dislocation is noted. Probable improved alignment involving posterior acetabular fracture on the left. Electronically Signed   By: Lupita Raider M.D.   On: 06/30/2022 08:28   CT Hip Left Wo Contrast  Result Date: 06/30/2022 CLINICAL DATA:  Presented earlier today with left acetabular fracture with  superior dislocation, subsequently the dislocation was reduced. EXAM: CT OF THE LEFT HIP WITHOUT CONTRAST TECHNIQUE: Multidetector CT imaging of the left hip was performed according to the standard protocol. Multiplanar CT image reconstructions were also generated. RADIATION DOSE REDUCTION: This exam was performed according to the departmental dose-optimization program which includes automated exposure control, adjustment of the mA and/or kV according to patient size and/or use of iterative reconstruction technique. COMPARISON:  Left hip films before and after left hip dislocation reduction earlier today, CT abdomen and pelvis with IV contrast 08/29/2010. FINDINGS: Bones/Joint/Cartilage There is normal bone mineralization. There is an acute comminuted intra-articular fracture in the mid to outer/superior aspect of the posterior column of the left acetabulum. The main fracture fragment is displaced up to 1 cm posteriorly and mildly anteriorly rotated and is superiorly displaced as well, consistent with the history of the superior hip dislocation. There is bony debris in the joint space and scattered intra-articular gas, small hemarthrosis. Largest intra-articular bone fragment is in the medial central joint space. No breaks in the skin are identified. There are few bone islands in the left femoral head but no suspicious bone lesions. No other fracture is seen in the visualized bony pelvis. The visualized proximal left femur is intact. The femoral head contour appears maintained. Ligaments Suboptimally assessed by CT. Muscles and Tendons No intramuscular hematoma is seen. Area tendons are not well studied with noncontrast CT but no obvious tendon disruption is suspected. Soft tissues There is a small linear hematoma along the posterior to mid left pelvic side wall. No large hematoma is seen. The prostate and visualized portions and bladder are unremarkable. Since 2012 there has been an interval left inguinal hernia  repair. There is concern for retraction of the right testicle up into the inguinal canal. Clinical correlation advised. IMPRESSION: 1. Acute comminuted intra-articular fracture of the posterior column of the left acetabulum with up to 1 cm posterior displacement and mild anterior rotation, and mild superior displacement of the main fracture fragment. 2. Bony debris in the joint space with scattered intra-articular gas, small hemarthrosis. 3. Small linear hematoma along the posterior to mid left pelvic side wall. 4.  No other fractures are seen in the visualized bony pelvis or of the proximal left femur. 5. Suspected retraction of the right testicle up into the inguinal canal. Electronically Signed   By: Almira Bar M.D.   On: 06/30/2022 03:15   DG Hip [place] or [place] Pelvis 1 View Left  Result Date: 06/30/2022 CLINICAL DATA:  Left hip reduction. EXAM: DG HIP (WITH OR WITHOUT PELVIS) 1V PORT LEFT COMPARISON:  Jun 30, 2022 (12:12 a.m.) FINDINGS: Acute fracture deformity is seen along the superior lateral aspect of the left acetabulum. There is no evidence of dislocation. There is no evidence of arthropathy or other focal bone abnormality. IMPRESSION: Acute fracture of the left acetabulum with interval reduction of the left hip dislocation seen on the prior study. Electronically Signed   By: Aram Candela M.D.   On: 06/30/2022 02:24   DG Hip Unilat W or Wo Pelvis 2-3 Views Left  Result Date: 06/30/2022 CLINICAL DATA:  Hip pain after fall. EXAM: DG HIP (WITH OR WITHOUT PELVIS) 2-3V LEFT COMPARISON:  None Available. FINDINGS: There is superior dislocation of the left hip with acetabular fracture and displaced fracture fragment. Is difficult to determine AP positioning, right hip is intact. No additional fracture seen. IMPRESSION: Left acetabular fracture with left hip dislocation. These results were called by telephone at the time of interpretation on 06/30/2022 at 12:34 am to provider [facility]  , who verbally acknowledged these results. Electronically Signed   By: Charlett Nose M.D.   On: 06/30/2022 00:36    EKG: Independently reviewed.  Sinus tachycardia with rate 102; prolonged QTc 539; LBBB with no evidence of acute ischemia   Labs on Admission: I have personally reviewed the available labs and imaging studies at the time of the admission.  Pertinent labs:    Na++ 134 CO2 19 Anion gap 18 WBC 11.4 Hgb 12.5 Platelets 481   Assessment and Plan: Principal Problem:   Hip fracture (HCC) Active Problems:   Hypertension   Chronic anxiety   Dyslipidemia   Cardiomyopathy, alcoholic (HCC)   Left bundle branch block (LBBB)   Alcohol dependence (HCC)    Hip dislocation/fracture -Apparently mechanical fall resulting in hip fracture -Orthopedics consulted - this is a complicated fracture and Dr. Carola Frost is planning to perform the surgery so he was transferred from [facility] -NPO after midnight in anticipation of surgical repair tomorrow -SCDs overnight, start Lovenox post-operatively (or as per ortho) -Pain control with Tylenol, Robaxin, Oxycodone, and Morphine prn -TOC team consult for rehab placement -Will need PT consult post-operatively -Hip fracture order set utilized -TXA per orthopedics -Fascia iliacus block ordered per anesthesia  Pre-operative stratification -Orthopedic/spinal surgery is associated with an intermediate (1-5%) cardiovascular risk for cardiac death and nonfatal MI -With his h/o CHF, his revised cardiac index gives a risk estimate of 6.0% -Because of this risk, he is recommended to have pre-operative EKG testing prior to surgery; this was done in the ER -His Detsky's Modified Cardiac Risk Index score is Class I, with a low cardiac risk -It is reasonable for him to go to the OR without additional evaluation  ETOH dependence -Patient with chronic ETOH dependence -He was recently discharged from rehab and has started drinking again -CIWA protocol -Folate,  thiamine, and MVI ordered -Will provide symptom-triggered BZD (ativan per CIWA protocol) only since the patient is able to communicate; is not showing current signs of delirium; and has no history of severe withdrawal. -TOC team consult for substance abuse counseling -Will  also check UDS. -Consider offering a medication for Alcohol Use Disorder at the time of d/c, to include Disulfuram; Naltrexone; or Acamprosate.  He has been on naltrexone but appears to take it inconsistently.  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  The patient would want to receive full resuscitative measures at this time.   Consults: Orthopedics; SW, Nutrition; will need PT post-operatively   DVT Prophylaxis: SCDs until approved for Lovenox by orthopedics  Family Communication: None present; he declined having me contact family at the time of admission  Severity of Illness: The appropriate patient status for this patient is INPATIENT. Inpatient status is judged to be reasonable and necessary in order to provide the required intensity of service to ensure the patient's safety. The patient's presenting symptoms, physical exam findings, and initial radiographic and laboratory data in the context of their chronic comorbidities is felt to place them at high risk for further clinical deterioration. Furthermore, it is not anticipated that the patient will be medically stable for discharge from the hospital within 2 midnights of admission.   * I certify that at the point of admission it is my clinical judgment that the patient  will require inpatient hospital care spanning beyond 2 midnights from the point of admission due to high intensity of service, high risk for further deterioration and high frequency of surveillance required.*  Author: Jonah Blue, MD 06/30/2022 2:36 PM  For on call review [email or web address].

## 2022-06-30 NOTE — Anesthesia Procedure Notes (Signed)
Procedure Name: Intubation Date/Time: 06/30/2022 12:55 PM  Performed by: Marena Chancy, CRNAPre-anesthesia Checklist: Patient identified, Emergency Drugs available, Suction available and Patient being monitored Patient Re-evaluated:Patient Re-evaluated prior to induction Oxygen Delivery Method: Circle System Utilized Preoxygenation: Pre-oxygenation with 100% oxygen Induction Type: IV induction Ventilation: Mask ventilation without difficulty Laryngoscope Size: Miller and 2 Grade View: Grade II Tube type: Oral Tube size: 7.5 mm Number of attempts: 1 Airway Equipment and Method: Stylet and Oral airway Placement Confirmation: ETT inserted through vocal cords under direct vision, positive ETCO2 and breath sounds checked- equal and bilateral Tube secured with: Tape Dental Injury: Teeth and Oropharynx as per pre-operative assessment

## 2022-06-30 NOTE — ED Provider Notes (Signed)
I assisted Dr. Marisa Severin by performing sedation to facilitate left hip fracture/dislocation reduction.   .Sedation  Date/Time: 06/30/2022 2:15 AM  Performed by: Delton Prairie, MD Authorized by: Delton Prairie, MD   Consent:    Consent obtained:  Verbal and written   Consent given by:  Patient Universal protocol:    Immediately prior to procedure, a time out was called: yes   Pre-sedation assessment:    Time since last food or drink:  4hrs   ASA classification: class 2 - patient with mild systemic disease     Mallampati score:  I - soft palate, uvula, fauces, pillars visible   Pre-sedation assessments completed and reviewed: pre-procedure airway patency not reviewed   Immediate pre-procedure details:    Reassessment: Patient reassessed immediately prior to procedure     Reviewed: vital signs, relevant labs/tests and NPO status     Verified: bag valve mask available, emergency equipment available, intubation equipment available, IV patency confirmed, oxygen available and suction available   Procedure details (see MAR for exact dosages):    Preoxygenation:  Nasal cannula   Sedation:  Propofol   Intended level of sedation: deep   Analgesia:  Hydromorphone   Intra-procedure monitoring:  Blood pressure monitoring, cardiac monitor, continuous pulse oximetry, continuous capnometry, frequent LOC assessments and frequent vital sign checks   Total Provider sedation time (minutes):  16 Post-procedure details:    Attendance: Constant attendance by certified staff until patient recovered     Recovery: Patient returned to pre-procedure baseline     Procedure completion:  Tolerated well, no immediate complications Comments:     Total of 227mg  Propofol provided.        Delton Prairie, MD 06/30/22 [phone]

## 2022-06-30 NOTE — Progress Notes (Signed)
Orthopedic Tech Progress Note Patient Details:  Thomas Williamson 06-08-1975 [id number]  Patient ID: Thomas Williamson, male   DOB: 09-07-1975, 47 y.o.   MRN: [id number] I was called by dr Steward Drone to apply 15 lbs of bucks traction. When we(daye and I) arrived the patient requested pain meds. The floor staff said that they couldn't give because he wasn't in the system yet. We told the patient and then asked to be call when the patient got the pain meds. I alerted my supervisor to this situation. [name] informed dr Steward Drone. [name] 06/30/2022, 7:45 AM

## 2022-06-30 NOTE — ED Notes (Signed)
EMTALA REVIEW: ALL REQUIRED DOCUMENTATION COMPLETED, INCLUDING; MEDICAL NECESSITY, EMTALA DOCUMENT, TRANSFER SIGNATURE AND VITAL SIGNS WITHIN REQUIRED DEPARTURE TIMEFRAME.

## 2022-06-30 NOTE — Op Note (Addendum)
06/30/2022  4:45 PM  PATIENT:  Thomas Williamson  [birth year] male   MEDICAL RECORD NUMBER: [id number]  PRE-OPERATIVE DIAGNOSES: 1. LEFT ACETABULUM FRACTURE, TRANSVERSE POSTERIOR WALL PATTERN 2. LEFT HIP DISLOCATION, PROVISIONALLY REDUCED  POST-OPERATIVE DIAGNOSES: 1. LEFT ACETABULUM FRACTURE, TRANSVERSE POSTERIOR WALL PATTERN 2. S/P LEFT  HIP DISLOCATION  PROCEDURE:   OPEN REDUCTION INTERNAL FIXATION OF LEFT TRANSVERSE POSTERIOR WALL ACETABULAR FRACTURE OPEN TREATMENT OF LEFT  HIP DISLOCATION WITH LABRAL AND CAPSULAR REPAIR, REMOVAL OF FOREIGN DEBRIS, AND OPEN REDUCTION  SURGEON:  Doralee Albino. Carola Frost, M.D.  ASSISTANT:  Montez Morita, PA-C.  ANESTHESIA:  General.  COMPLICATIONS:  None.  TOURNIQUET: None.  ESTIMATED BLOOD LOSS:  see anesthestic record for details..  DISPOSITION:  To PACU.  CONDITION:  Stable.  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 Postreduction demonstrated subluxation and an incarcerated fragment so I recommended emergent treatment. I discussed with the patient the risks and benefits of surgical treatment including infection, avascular necrosis, arthritis, nerve injury/ foot drop, vessel injury, malunion, nonunion, instability, DVT, PE, heart attack, stroke, heterotopic ossification, need for blood transfusion or further surgery including total hip arthroplasty.  These risks were acknowledged and consent provided to proceed.   BRIEF SUMMARY OF PROCEDURE:  After administration of 2g of Ancef, the patient was taken to the operating room where general anesthesia was induced. Patient was then was positioned left side up with all prominences padded appropriately and axillary roll.   After thorough prep with Chlorhexidine wash and betadine scrub and paint, drapes were applied and time-out called. A standard Kocher-Langenbeck approach was made.  Once we exposed the tensor, it was split in line with the skin incision and the deep Charnley retractor placed.  The short rotators were identified and divided near their insertion.  We evacuated the hematoma from the fracture site.  The muscle was not significantly contused but appeared to be of somewhat poor quality for age. The retroacetabular space was cleared with Cobb and then distally along the ischium after using the short rotators to reflect the sciatic nerve.  The hip was brought into abduction, extension and the knee in flexion to fully relax the sciatic nerve. We were careful to guard against applying pressure to the nerve during retraction and this was diligently watched throughout.  The findings included a larger wall segment and torn capsule. There were several small fragments within the joint resulting from the dislocation. In order to remove these, a Schanz pin was placed in the proximal femur and distraction pulled by my assistant while the other assistant held retraction, then I thoroughly irrigated and used the series of sharp debridement and the pituitary rongeur to rid the joint of all the potential third body wear.  After this was performed, we turned our attention to the transverse component of the fracture.  Here, a plate was over-contoured to go along the posterior column. The fracture interdigitated nicely. It was secured with this plate along the sciatic buttress.  Bicortical fixation was obtained distally and proximally with several screws. The column reduction was checked with C-arm.   I then drilled the femoral head just off the articular margin of the fracture, the femoral head showed blood flow within 3 seconds, suggesting  intact vascularity. The large posterior wall fragments were then brought down and  reduced.  These were held provisionally with k wires and then a contoured posterior wall buttress plate applied, securing bicortical fixation in the ischium and superiorly in the retroacetabular space.  The wounds were irrigated thoroughly after final images showed appropriate reduction, hardware placement, trajectory and length.   The torn capsule from dislocation was repaired using figure of eight #2 fiberwire for the labrum and capsule.    Montez Morita, PA-C assisted me throughout and assistance was absolutely necessary.  Closure was performed in standard layered fashion using FiberWire for the short rotators and piriformis tendons back through bone tunnels.  The tensor was closed in line with the skin using a figure-of-eight #1 Vicryl and then 0 Vicryl for multiple layers of the deep adipose and 2-0 Vicryl and nylon for the skin. Sterile gently compressive dressing was applied.  The patient was then taken to the PACU in stable condition.   PROGNOSIS:   Reduction and integrity of the acetabulum has been restored. Because of the articular involvement, risk of arthritis is elevated, which may eventually require a total hip arthroplasty. Patient will require posterior hip precautions and be touchdown weightbearing for the next 8 weeks with gradual weightbearing thereafter. DVT prophylaxis will resume.  We will recommend prophylactic radiation for heterotopic ossification should he wish to consider this after consultation with the radiologists. The patient's substance use history increases the risk of noncompliance which could have devastating consequences

## 2022-06-30 NOTE — ED Notes (Signed)
Pt's tshirt and belt sent home with family. Pt left ER with personal pillow and cell phone.

## 2022-06-30 NOTE — Progress Notes (Signed)
Orthopedic Tech Progress Note Patient Details:  Thomas Williamson [birth year] [id number]  Musculoskeletal Traction Type of Traction: Bucks Skin Traction Traction Location: LLE Traction Weight: 15 lbs   Post Interventions Patient Tolerated: Well Instructions Provided: Care of device  [name] 06/30/2022, 10:00 AM

## 2022-06-30 NOTE — Progress Notes (Signed)
Orthopaedic Trauma Service   Post op plan   S/p ORIF L acetabulum fracture dislocation with foot drop   TDWB L leg x 8 weeks  Posterior hip precautions x 12 weeks Therapies to start POD1  Ice PRN  Abduction pillow until pt can comprehend hip precautions  PRAFO boot for foot drop. Can come out of it periodically for PROM and heel cord stretching  Will need AFO   Will communicate with Radiation oncology as he will need XRT for heterotopic ossification prophylaxis. Hopefully this can be done in the next 48 hours  Ok to start reg diet post op  Lovenox to start tomorrow am   Mearl Latin, PA-C [phone] (C) 06/30/2022, 4:00 PM  [facility] [address] [place] [address] [phone] (F)         Patient ID: Thomas Williamson, male   DOB: May 06, 1975, 47 y.o.   MRN: [id number]

## 2022-06-30 NOTE — Plan of Care (Signed)
Patient presented to the [place] [facility] emergency room at approximately midnight with a left hip posterior hip fracture dislocation.  I was consulted by the ED provider for discussion of the patient's care given the fact that the on-call provider at [facility] did not feel comfortable with acetabular fractures.  Given that he was dislocated I did recommend urgent closed reduction which was performed by 2 AM.  Postreduction CT scan did show an incarcerated fragment in addition to a large posterior wall acetabular fracture.  The patient was placed in 15 pounds of Buck's traction.  He was subsequently transferred to [facility] for definitive management.  On arrival to the [facility] inpatient floor at 6:30 AM he does have a left foot drop on the left side.  He states this is been occurring since the dislocation.  Limb lengths are equal consistent with persistent reduction.  His Buck traction was offered to move and I have asked the Ortho technician to replace this.  At this time I have consulted Dr. Carola Frost with the orthopedic trauma team for further assistance given the complexity and nature of the fracture.  Will plan for possible fixation today versus placement in skeletal traction for temporization.  He will be n.p.o. at this time.

## 2022-06-30 NOTE — Consult Note (Signed)
Reason for Consult:Left acetabulum fx Referring Physician: Jonah Blue Time called: 0840 Time at bedside: 0930   Thomas Williamson is an 47 y.o. male.  HPI: Wilmer was walking his dog down some steps to go outside when he lost balance and fell. He landed on his left hip and had immediate pain and could not get up. He was brought to the ED at [facility] and was dx with an acetabulum fx. Orthopedic surgery was consulted and recommended transfer to [facility] for evaluation and treatment by the orthopedic trauma service. 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  Past Medical History:  Diagnosis Date   Alcohol use disorder, severe, in early remission (HCC) 03/30/2018   Allergy    Anxiety    Bipolar disorder, in partial remission, most recent episode hypomanic (HCC) 03/30/2018   [facility] Dr. [facility]omasa [facility]   Bleeding internal hemorrhoids 09/12/2013   Cannabis dependence (HCC) 07/13/2018   Daily use   Cervical disc disease 11/20/2014   Chronic anxiety 07/13/2018   Chronic systolic (congestive) heart failure (HCC)    Complication of anesthesia    Depression    Dyslipidemia    GERD (gastroesophageal reflux disease)    Hernia, inguinal, bilateral 02/24/2011   High ankle sprain of right lower extremity 01/16/2015   History of hiatal hernia    Hx of hepatitis C 10/05/2017   -treated in 2019 -hepatology recommended no further surveillance needed except for LFTs with labs and see hepatologist if elevated   Hypertension    Lipids abnormal 09/29/2013   Pneumonia    PONV (postoperative nausea and vomiting)     Past Surgical History:  Procedure Laterality Date   ANTERIOR CRUCIATE LIGAMENT REPAIR  2010   BIOPSY  03/13/2019   Procedure: BIOPSY;  Surgeon: Benancio Deeds, MD;  Location: [facility];  Service: Gastroenterology;;   COLONOSCOPY N/A 03/13/2019   per Dr. Adela Lank, adenomatous polyps, repeat in 3 yrs   INGUINAL HERNIA  REPAIR  02/23/2012   Procedure: LAPAROSCOPIC BILATERAL INGUINAL HERNIA REPAIR;  Surgeon: Kandis Cocking, MD;  Location: [facility];  Service: General;  Laterality: Bilateral;  Laparoscopic Bilateral Inguinal Hernia Repair with mesh   INSERTION OF MESH  02/23/2012   Procedure: INSERTION OF MESH;  Surgeon: Kandis Cocking, MD;  Location: [facility];  Service: General;  Laterality: N/A;   NOSE SURGERY  [id number]   POLYPECTOMY  03/13/2019   Procedure: POLYPECTOMY;  Surgeon: Benancio Deeds, MD;  Location: [facility];  Service: Gastroenterology;;    Family History  Problem Relation Age of Onset   Hypertension Mother    Atrial fibrillation Mother    Cancer Father    Hypertension Father    Leukemia Father 32   Prostate cancer Father 20   Diabetes Father    Diabetes Brother    Other Brother        growth on finger   Learning disabilities Paternal Aunt    Lung cancer Maternal Grandmother    Heart attack Maternal Grandfather    Congestive Heart Failure Paternal Grandmother    Colon cancer Neg Hx     Social History:  reports that he has been smoking e-cigarettes. He has never used smokeless tobacco. He reports current alcohol use of about 4.0 standard drinks of alcohol per week. He reports current drug use. Frequency: 2.00 times per week. Drug: Marijuana.  Allergies:  Allergies  Allergen Reactions   Hydrocodone  Can tolerate    Medications: I have reviewed the patient's current medications.  Results for orders placed or performed during the hospital encounter of 06/30/22 (from the past 48 hour(s))  Type and screen [facility]     Status: None (Preliminary result)   Collection Time: 06/30/22  9:15 AM  Result Value Ref Range   ABO/RH(D) PENDING    Antibody Screen PENDING    Sample Expiration      07/03/2022,2359 Performed at [facility], [address] [place], [place] [address]     DG HIP UNILAT WITH PELVIS 1V LEFT  Result Date: 06/30/2022 CLINICAL  DATA:  Postoperative status. EXAM: DG HIP (WITH OR WITHOUT PELVIS) 1V*L* COMPARISON:  CT scan of same day. FINDINGS: No dislocation is noted. There appears to be improved alignment involving the posterior acetabular fracture noted on prior CT scan. No other bony abnormality is noted. IMPRESSION: No dislocation is noted. Probable improved alignment involving posterior acetabular fracture on the left. Electronically Signed   By: Lupita Raider M.D.   On: 06/30/2022 08:28   CT Hip Left Wo Contrast  Result Date: 06/30/2022 CLINICAL DATA:  Presented earlier today with left acetabular fracture with superior dislocation, subsequently the dislocation was reduced. EXAM: CT OF THE LEFT HIP WITHOUT CONTRAST TECHNIQUE: Multidetector CT imaging of the left hip was performed according to the standard protocol. Multiplanar CT image reconstructions were also generated. RADIATION DOSE REDUCTION: This exam was performed according to the departmental dose-optimization program which includes automated exposure control, adjustment of the mA and/or kV according to patient size and/or use of iterative reconstruction technique. COMPARISON:  Left hip films before and after left hip dislocation reduction earlier today, CT abdomen and pelvis with IV contrast 08/29/2010. FINDINGS: Bones/Joint/Cartilage There is normal bone mineralization. There is an acute comminuted intra-articular fracture in the mid to outer/superior aspect of the posterior column of the left acetabulum. The main fracture fragment is displaced up to 1 cm posteriorly and mildly anteriorly rotated and is superiorly displaced as well, consistent with the history of the superior hip dislocation. There is bony debris in the joint space and scattered intra-articular gas, small hemarthrosis. Largest intra-articular bone fragment is in the medial central joint space. No breaks in the skin are identified. There are few bone islands in the left femoral head but no suspicious  bone lesions. No other fracture is seen in the visualized bony pelvis. The visualized proximal left femur is intact. The femoral head contour appears maintained. Ligaments Suboptimally assessed by CT. Muscles and Tendons No intramuscular hematoma is seen. Area tendons are not well studied with noncontrast CT but no obvious tendon disruption is suspected. Soft tissues There is a small linear hematoma along the posterior to mid left pelvic side wall. No large hematoma is seen. The prostate and visualized portions and bladder are unremarkable. Since 2012 there has been an interval left inguinal hernia repair. There is concern for retraction of the right testicle up into the inguinal canal. Clinical correlation advised. IMPRESSION: 1. Acute comminuted intra-articular fracture of the posterior column of the left acetabulum with up to 1 cm posterior displacement and mild anterior rotation, and mild superior displacement of the main fracture fragment. 2. Bony debris in the joint space with scattered intra-articular gas, small hemarthrosis. 3. Small linear hematoma along the posterior to mid left pelvic side wall. 4. No other fractures are seen in the visualized bony pelvis or of the proximal left femur. 5. Suspected retraction of  the right testicle up into the inguinal canal. Electronically Signed   By: Almira Bar M.D.   On: 06/30/2022 03:15   DG Hip [place] W or [place] Pelvis 1 View Left  Result Date: 06/30/2022 CLINICAL DATA:  Left hip reduction. EXAM: DG HIP (WITH OR WITHOUT PELVIS) 1V PORT LEFT COMPARISON:  Jun 30, 2022 (12:12 a.m.) FINDINGS: Acute fracture deformity is seen along the superior lateral aspect of the left acetabulum. There is no evidence of dislocation. There is no evidence of arthropathy or other focal bone abnormality. IMPRESSION: Acute fracture of the left acetabulum with interval reduction of the left hip dislocation seen on the prior study. Electronically Signed   By: Aram Candela M.D.    On: 06/30/2022 02:24   DG Hip Unilat W or Wo Pelvis 2-3 Views Left  Result Date: 06/30/2022 CLINICAL DATA:  Hip pain after fall. EXAM: DG HIP (WITH OR WITHOUT PELVIS) 2-3V LEFT COMPARISON:  None Available. FINDINGS: There is superior dislocation of the left hip with acetabular fracture and displaced fracture fragment. Is difficult to determine AP positioning, right hip is intact. No additional fracture seen. IMPRESSION: Left acetabular fracture with left hip dislocation. These results were called by telephone at the time of interpretation on 06/30/2022 at 12:34 am to provider [facility] , who verbally acknowledged these results. Electronically Signed   By: Charlett Nose M.D.   On: 06/30/2022 00:36    Review of Systems  HENT:  Negative for ear discharge, ear pain, hearing loss and tinnitus.   Eyes:  Negative for photophobia and pain.  Respiratory:  Negative for cough and shortness of breath.   Cardiovascular:  Negative for chest pain.  Gastrointestinal:  Negative for abdominal pain, nausea and vomiting.  Genitourinary:  Negative for dysuria, flank pain, frequency and urgency.  Musculoskeletal:  Positive for arthralgias (Left hip). Negative for back pain, myalgias and neck pain.  Neurological:  Negative for dizziness and headaches.  Hematological:  Does not bruise/bleed easily.  Psychiatric/Behavioral:  The patient is not nervous/anxious.    There were no vitals taken for this visit. Physical Exam Constitutional:      General: He is not in acute distress.    Appearance: He is well-developed. He is not diaphoretic.  HENT:     Head: Normocephalic and atraumatic.  Eyes:     General: No scleral icterus.       Right eye: No discharge.        Left eye: No discharge.     Conjunctiva/sclera: Conjunctivae normal.  Cardiovascular:     Rate and Rhythm: Normal rate and regular rhythm.  Pulmonary:     Effort: Pulmonary effort is normal. No respiratory distress.  Musculoskeletal:      Cervical back: Normal range of motion.     Comments: LLE No traumatic wounds, ecchymosis, or rash  Mod TTP hip, in Bucks and KI  Sens SPN, TN intact, DPN paresthetic  Motor EHL 5/5  DP 2+, No significant edema  Skin:    General: Skin is warm and dry.  Neurological:     Mental Status: He is alert.  Psychiatric:        Mood and Affect: Mood normal.        Behavior: Behavior normal.     Assessment/Plan: Left acetabulum fx -- Plan ORIF today with Dr. Carola Frost. Please keep NPO.    Freeman Caldron, PA-C Orthopedic Surgery [phone] 06/30/2022, 9:38 AM

## 2022-06-30 NOTE — Transfer of Care (Signed)
Immediate Anesthesia Transfer of Care Note  Patient: Thomas Williamson  Procedure(s) Performed: OPEN REDUCTION INTERNAL FIXATION (ORIF) ACETABULAR FRACTURE (Left: Shoulder)  Patient Location: PACU  Anesthesia Type:General  Level of Consciousness: awake, alert , and oriented  Airway & Oxygen Therapy: Patient Spontanous Breathing and Patient connected to nasal cannula oxygen  Post-op Assessment: Report given to RN and Post -op Vital signs reviewed and stable  Post vital signs: Reviewed and stable  Last Vitals:  Vitals Value Taken Time  BP 153/104 06/30/22 1545  Temp    Pulse 112 06/30/22 1548  Resp 12 06/30/22 1548  SpO2 99 % 06/30/22 1548  Vitals shown include unvalidated device data.  Last Pain:  Vitals:   06/30/22 1118  TempSrc:   PainSc: 10-Worst pain ever         Complications: No notable events documented.

## 2022-06-30 NOTE — Anesthesia Preprocedure Evaluation (Addendum)
Anesthesia Evaluation  Patient identified by MRN, date of birth, ID band Patient awake    Reviewed: Allergy & Precautions, NPO status , Patient's Chart, lab work & pertinent test results, reviewed documented beta blocker date and time   History of Anesthesia Complications (+) PONV and history of anesthetic complications  Airway Mallampati: II  TM Distance: >3 FB Neck ROM: Full    Dental  (+) Dental Advisory Given   Pulmonary pneumonia, Current SmokerPatient did not abstain from smoking.   breath sounds clear to auscultation       Cardiovascular hypertension, Pt. on medications and Pt. on home beta blockers (-) angina +CHF  + dysrhythmias  Rhythm:Regular Rate:Normal  '20 stress: No obvious ischemia or infarct, Low EF 35% '20 ECHO: EF 30-35%, valves OK   Neuro/Psych  PSYCHIATRIC DISORDERS Anxiety Depression Bipolar Disorder   negative neurological ROS     GI/Hepatic hiatal hernia,GERD  Medicated and Controlled,,(+)     substance abuse  alcohol use and marijuana use, Hepatitis -, C  Endo/Other  negative endocrine ROS    Renal/GU negative Renal ROS     Musculoskeletal   Abdominal   Peds  Hematology negative hematology ROS (+)   Anesthesia Other Findings   Reproductive/Obstetrics                             Anesthesia Physical Anesthesia Plan  ASA: 3  Anesthesia Plan: General   Post-op Pain Management: Celebrex PO (pre-op)*   Induction: Intravenous  PONV Risk Score and Plan: 2 and Treatment may vary due to age or medical condition, Ondansetron and Dexamethasone  Airway Management Planned: Oral ETT and LMA  Additional Equipment: None  Intra-op Plan:   Post-operative Plan: Extubation in OR  Informed Consent: I have reviewed the patients History and Physical, chart, labs and discussed the procedure including the risks, benefits and alternatives for the proposed anesthesia with the  patient or authorized representative who has indicated his/her understanding and acceptance.     Dental advisory given  Plan Discussed with: CRNA and Anesthesiologist  Anesthesia Plan Comments: (  Patch Wear Time:  9 days and 17 hours (2024-04-22T18:49:59-0400 to 2024-05-02T12:19:32-0400)   Patient had a min HR of 57 bpm, max HR of 157 bpm, and avg HR of 83 bpm. Predominant underlying rhythm was  Sinus Rhythm. Bundle Branch Block/IVCD was present. QRS morphology changes were present throughout recording.    1 run of Supraventricular Tachycardia  occurred lasting 11.5 secs with a max rate of 141 bpm (avg 122 bpm). Isolated SVEs were rare (<1.0%), SVE Couplets were rare (<1.0%), and SVE Triplets were rare (<1.0%). No Isolated VEs, VE Couplets, or VE Triplets were present.   Symptoms associated with sinus rhythm.   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  )        Anesthesia Quick Evaluation

## 2022-06-30 NOTE — Progress Notes (Signed)
Consult request received for left acetabular fracture dislocation s/p reduction with intra-articular fracture. Have discussed with the requesting MD patient's stability, pain control, and ongoing work up. I have reviewed x-rays and developed a provisional plan.   Full consultation to follow shortly. Anticipate surgical repair this am.  Myrene Galas, MD [facility], [facility] [phone]

## 2022-06-30 NOTE — Progress Notes (Signed)
Pt. Off unit to X-Ray 

## 2022-06-30 NOTE — Progress Notes (Signed)
Pt. Off unit to OR.

## 2022-07-01 ENCOUNTER — Ambulatory Visit
Admit: 2022-07-01 | Discharge: 2022-07-01 | Disposition: A | Discharge status: Home / Self Care | Payer: BC Managed Care – PPO | Attending: Radiation Oncology | Admitting: Radiation Oncology

## 2022-07-01 ENCOUNTER — Encounter: Payer: Self-pay | Admitting: Radiation Oncology

## 2022-07-01 ENCOUNTER — Encounter (HOSPITAL_COMMUNITY): Payer: Self-pay | Admitting: Internal Medicine

## 2022-07-01 ENCOUNTER — Other Ambulatory Visit: Payer: Self-pay

## 2022-07-01 DIAGNOSIS — E785 Hyperlipidemia, unspecified: Secondary | ICD-10-CM | POA: Diagnosis not present

## 2022-07-01 DIAGNOSIS — S32402A Unspecified fracture of left acetabulum, initial encounter for closed fracture: Secondary | ICD-10-CM | POA: Diagnosis not present

## 2022-07-01 DIAGNOSIS — F419 Anxiety disorder, unspecified: Secondary | ICD-10-CM | POA: Diagnosis not present

## 2022-07-01 DIAGNOSIS — F1029 Alcohol dependence with unspecified alcohol-induced disorder: Secondary | ICD-10-CM

## 2022-07-01 DIAGNOSIS — M21372 Foot drop, left foot: Secondary | ICD-10-CM | POA: Diagnosis present

## 2022-07-01 DIAGNOSIS — I426 Alcoholic cardiomyopathy: Secondary | ICD-10-CM | POA: Diagnosis not present

## 2022-07-01 DIAGNOSIS — M6140 Other calcification of muscle, unspecified site: Secondary | ICD-10-CM

## 2022-07-01 DIAGNOSIS — E559 Vitamin D deficiency, unspecified: Secondary | ICD-10-CM

## 2022-07-01 DIAGNOSIS — I447 Left bundle-branch block, unspecified: Secondary | ICD-10-CM

## 2022-07-01 HISTORY — DX: Vitamin D deficiency, unspecified: E55.9

## 2022-07-01 LAB — CBC
HCT: 30.8 % — ABNORMAL LOW (ref 39.0–52.0)
Hemoglobin: 10.3 g/dL — ABNORMAL LOW (ref 13.0–17.0)
MCH: 32.4 pg (ref 26.0–34.0)
MCHC: 33.4 g/dL (ref 30.0–36.0)
MCV: 96.9 fL (ref 80.0–100.0)
Platelets: 350 10*3/uL (ref 150–400)
RBC: 3.18 MIL/uL — ABNORMAL LOW (ref 4.22–5.81)
RDW: 12.8 % (ref 11.5–15.5)
WBC: 12.9 10*3/uL — ABNORMAL HIGH (ref 4.0–10.5)
nRBC: 0 % (ref 0.0–0.2)

## 2022-07-01 LAB — RAD ONC ARIA SESSION SUMMARY
Course Elapsed Days: 0
Plan Fractions Treated to Date: 1
Plan Total Fractions Prescribed: 1
Session Number: 1

## 2022-07-01 LAB — BASIC METABOLIC PANEL
Anion gap: 11 (ref 5–15)
BUN: 14 mg/dL (ref 6–20)
CO2: 23 mmol/L (ref 22–32)
Calcium: 9.4 mg/dL (ref 8.9–10.3)
Chloride: 99 mmol/L (ref 98–111)
Creatinine, Ser: 1.13 mg/dL (ref 0.61–1.24)
GFR, Estimated: >60 mL/min (ref >60)
Glucose, Bld: 123 mg/dL — ABNORMAL HIGH (ref 70–99)
Potassium: 4.4 mmol/L (ref 3.5–5.1)
Sodium: 133 mmol/L — ABNORMAL LOW (ref 135–145)

## 2022-07-01 LAB — VITAMIN D 25 HYDROXY (VIT D DEFICIENCY, FRACTURES): Vit D, 25-Hydroxy: 10.13 ng/mL — ABNORMAL LOW (ref 30–100)

## 2022-07-01 LAB — GLUCOSE, CAPILLARY: Glucose-Capillary: 109 mg/dL — ABNORMAL HIGH (ref 70–99)

## 2022-07-01 MED ORDER — CHLORHEXIDINE GLUCONATE CLOTH 2 % EX PADS: 6.0000 | MEDICATED_PAD | Freq: Every day | CUTANEOUS | Status: DC

## 2022-07-01 MED ORDER — MUPIROCIN 2 % EX OINT: 1.0000 | TOPICAL_OINTMENT | Freq: Two times a day (BID) | CUTANEOUS | Status: DC

## 2022-07-01 MED ORDER — CHLORHEXIDINE GLUCONATE CLOTH 2 % EX PADS
6.0000 | MEDICATED_PAD | Freq: Every day | CUTANEOUS | Status: AC
  Administered 2022-07-01 – 2022-07-05 (×5): 6 via TOPICAL

## 2022-07-01 MED ORDER — ENSURE ENLIVE PO LIQD
237.0000 mL | Freq: Two times a day (BID) | ORAL | Status: DC
  Administered 2022-07-01 – 2022-07-09 (×8): 237 mL via ORAL

## 2022-07-01 MED ORDER — VITAMIN D 25 MCG (1000 UNIT) PO TABS
2000.0000 [IU] | ORAL_TABLET | Freq: Every day | ORAL | Status: DC
  Administered 2022-07-01 – 2022-07-10 (×10): 2000 [IU] via ORAL
  Filled 2022-07-01 (×10): qty 2

## 2022-07-01 MED ORDER — ACETAMINOPHEN 325 MG PO TABS
650.0000 mg | ORAL_TABLET | Freq: Four times a day (QID) | ORAL | Status: DC | PRN
  Administered 2022-07-02 – 2022-07-04 (×2): 650 mg via ORAL
  Filled 2022-07-01 (×2): qty 2

## 2022-07-01 MED ORDER — MUPIROCIN 2 % EX OINT
1.0000 | TOPICAL_OINTMENT | Freq: Two times a day (BID) | CUTANEOUS | Status: AC
  Administered 2022-07-01 – 2022-07-05 (×10): 1 via NASAL
  Filled 2022-07-01 (×3): qty 22

## 2022-07-01 NOTE — Progress Notes (Signed)
Initial Nutrition Assessment  DOCUMENTATION CODES:   Not applicable  INTERVENTION:  Will update diet order so pt receives assistance with ordering meals. Encouraged adequate intake of protein at meals.  Provide Ensure Enlive po BID, each supplement provides 350 kcal and 20 grams of protein. Pt prefers chocolate.  Continue multivitamin with minerals po daily, thiamine 100 mg daily, folic acid 1 mg daily.  NUTRITION DIAGNOSIS:   Increased nutrient needs related to hip fracture, post-op healing as evidenced by estimated needs.  GOAL:   Patient will meet greater than or equal to 90% of their needs  MONITOR:   PO intake, Supplement acceptance, Labs, Weight trends, I & O's  REASON FOR ASSESSMENT:   Consult Assessment of nutrition requirement/status, Hip fracture protocol  ASSESSMENT:   47 year old male with PMHx of EtOH use, EtOH cardiomyopathy, bipolar, chronic systolic CHF, HTN presented after a fall found to have left acetabular fracture s/p ORIF 06/30/22.  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   Met with pt at bedside. He reports having some nausea today but denies having any emesis. He is looking forward to breakfast tray. He asked RD to call and add coffee and fruit onto tray. When RD called found out he had actually not placed breakfast order yet. Assisted with placing order. Pt reports he had a good appetite and intake PTA. He reports eating two meals daily. For his first meal he has eggs with toast or cereal. For second meal he may have ham or steak or chicken with rice and beans and salad. Denies food allergies or intolerances. Denies abdominal pain. Discussed increased needs for healing. Pt would benefit from assistance with ordering meals. Pt would also like to try chocolate Ensure to help meet calorie/protein needs.  Pt reports his UBW is between 170-175 lbs and denies any unintentional weight loss. Weights in chart fluctuate between 71-77  kg. Pt reports in the past when he would drink heavily he would not eat as well and weight would go down. However, reports he has been in rehab until recently so he has been eating well. Suspect admission wt was reported and not measured. Obtained bed scale weight of 78.7 kg (173.5 lbs).  Medications reviewed and include: Colace 100 mg BID,folic acid 1 mg daily, gabapentin, magnesium oxide 400 mg BID, MVI daily, pantoprazole, potassium chloride 10 mEq daily, thiamine 100 mg daily, cefazolin  Labs reviewed: Sodium 133  UOP: 1250 mL UOP previous 24 hours  I/O: +640 mL since admission  NUTRITION - FOCUSED PHYSICAL EXAM:  Flowsheet Row Most Recent Value  Orbital Region No depletion  Upper Arm Region No depletion  Thoracic and Lumbar Region No depletion  Buccal Region No depletion  Temple Region No depletion  Clavicle Bone Region No depletion  Clavicle and Acromion Bone Region No depletion  Scapular Bone Region No depletion  Dorsal Hand No depletion  Patellar Region Mild depletion  Anterior Thigh Region Mild depletion  Posterior Calf Region Mild depletion  Edema (RD Assessment) None  Hair Reviewed  Eyes Reviewed  Mouth Reviewed  Skin Reviewed  Nails Reviewed       Diet Order:   Diet Order             Diet regular Room service appropriate? Yes; Fluid consistency: Thin  Diet effective now                   EDUCATION NEEDS:   Education needs have been addressed  Skin:  Skin Assessment: Skin Integrity Issues: Skin Integrity Issues:: Incisions Incisions: closed incision left leg  Last BM:  Unknown/PTA  Height:   Ht Readings from Last 1 Encounters:  06/30/22 5\' 9"  (1.753 m)   Weight:   Wt Readings from Last 1 Encounters:  07/01/22 78.7 kg   Ideal Body Weight:  72.7 kg  BMI:  Body mass index is 25.62 kg/m.  Estimated Nutritional Needs:   Kcal:  2100-2300  Protein:  105-115 grams  Fluid:  2.1-2.3 L/day  Letta Median, MS, RD, LDN, CNSC Pager  number available on Amion

## 2022-07-01 NOTE — Progress Notes (Signed)
[facility] called for Pt. transport to [facility] for 11am appointment time. This nurse was notified that transport could not be provided for that time. This nurse notified receiving nurse at [facility]. Which stated "she will notify provided and update me on changes."

## 2022-07-01 NOTE — Progress Notes (Signed)
PROGRESS NOTE    Thomas Williamson  ZOX:[id number] DOB: 11-04-1975 DOA: 06/30/2022 PCP: Nelwyn Salisbury, MD   Brief Narrative:  The patient is a 47 year old Caucasian male with past medical history significant for but limited to alcohol use disorder, bipolar disorder, chronic systolic CHF, hypertension as well as other comorbidities who presented with a fall.  Reports that he was coming down the stairs with his dog and the dog stopped and the patient pivoted and he tripped over the dog and fell down the rest of the stairs.  He says that he had pain in the hip and overall send notes that he "drank a double" last night prior to fall and feels that it did not contribute to the fall.  He recently returned from rehab for his alcoholism and only drinks a few drinks daily now.  He was at the [facility] ED and transferred to [facility] and was found to have a left acetabular fracture and hip dislocation.  Orthopedic surgery evaluated and took the patient for surgical intervention on 06/30/2022.  **Interim History  Postoperative day 1 and pain regimen has been initiated.  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  PT OT to further evaluate and treat.  Assessment and Plan: No notes have been filed under this hospital service. Service: Hospitalist  Hip dislocation/fracture s/p ORIF Aceteabulum Fracture Dislocation with Foot Drop POD 1 -Apparently mechanical fall resulting in hip fracture -Orthopedics consulted - this is a complicated fracture and Dr. Carola Frost is planning to perform the surgery so he was transferred from [facility] to [facility] -Was NPO after midnight in anticipation of surgical repair and now on a Diet -SCDs overnight and now on Enoxaparin 40 mg sq q24h while inpatient and then to be discharged on Eliquis 2.5 mg p.o. twice  daily for 30 days -Vitamin D deficiency with low vitamin D of 10.13 and so orthopedic surgery was checking a testosterone panel in the setting of his chronic alcoholism and marijuana use; initiated on cholecalciferol 2000 units p.o. daily will have orthopedic surgery adjust further -Pain control with benefit 650 g p.o. every 6hprn, Methacarbamol 1000 mg IV q6hprn Muscle Spasms, Tramadol 50 mg po q8hprn Moderate Paine, Oxycodone 5-10 mg po q4hprn Breakthrough Pain, and IV Morphine 2 mg q2hprn Severe Pain  -TOC team consult for rehab placement -Will need PT/OT consult post-operatively and this is pending -Hip fracture order set utilized -TXA per orthopedics -Fascia iliacus block ordered per anesthesia -Further Management per Orthopedic Surgery and they are recommending TDWB Left Leg 8 weeks with Posterior Hip Precautions x 12 weeks  -Orthopedic surgery recommending daily dressing changes starting on 07/03/2022 for the left hip -Orthopedic surgery also recommending radiation therapy for 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 twice daily, MiraLAX 17 g daily as needed for mild constipation and if necessary will change to senna docusate 1 tab p.o. twice daily; patient also has a bisacodyl 5 mg p.o. daily as needed for Moderate constipation    Pre-operative stratification -Orthopedic/spinal surgery is associated with an intermediate (1-5%) cardiovascular risk for cardiac death and  nonfatal MI -With his h/o CHF, his revised cardiac index gives a risk estimate of 6.0% -Because of this risk, he is recommended to have pre-operative EKG testing prior to surgery; this was done in the ER -His Detsky's Modified Cardiac Risk Index score is  Class I, with a low cardiac risk -It is reasonable for him to go to the OR without additional evaluation   ETOH Dependence -Patient with chronic ETOH dependence -He was recently discharged from rehab and has started drinking again -CIWA protocol -Folate, thiamine, and MVI ordered -Will provide symptom-triggered BZD (ativan per CIWA protocol) only since the patient is able to communicate; is not showing current signs of delirium; and has no history of severe withdrawal. -TOC team consult for substance abuse counseling -Will also check UDS but not done yet -Consider offering a medication for Alcohol Use Disorder at the time of d/c, to include Disulfuram; Naltrexone; or Acamprosate.  He has been on naltrexone but appears to take it inconsistently.   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 Na+ Trend: Recent Labs  Lab 06/18/22 1154 06/29/22 2342 06/30/22 0913 07/01/22 0356  NA 136 134* 132* 133*  -Discontinue po HCTZ while hospitalized  -Continue to Monitor and Trend and repeat CMP in the AM  Leukocytosis -WBC Trend: Recent Labs  Lab 06/29/22 2342 06/30/22 0913 07/01/22 0356  WBC  11.4* 13.3* 12.9*  -Likely Reactive in the setting of above -Continue to Monitor for S/Sx of Infection and repeat CBC in the AM  Normocytic Anemia -Hgb/Hct Trend: Recent Labs  Lab 06/29/22 2342 06/30/22 0913 07/01/22 0356  HGB 12.5* 11.7* 10.3*  HCT 36.5* 33.4* 30.8*  MCV 93.1 92.5 96.9  -Check Anemia Panel in the AM -Continue to Monitor for S/Sx of Bleeding; No overt bleeding noted -Repeat CBC in the AM  GERD/GI Prophylaxis -C/w Pantoprazole 40 mg po Daily  DVT prophylaxis: enoxaparin (LOVENOX) injection 40 mg Start: 07/01/22 0800 SCDs Start: 06/30/22 1643 SCDs Start: 06/30/22 0957    Code Status: Full Code Family Communication: No family currently at bedside  Disposition Plan:  Level of care: Telemetry Medical Status is: Inpatient Remains inpatient appropriate because: Further evaluation by PT and OT for safe discharge disposition   Consultants:  Orthopedic surgery Radiation oncology  Procedures:  Closed left transverse posterior wall acetabular fracture dislocation with foot drop status post ORIF of the left acetabulum done by Dr. Carola Frost  Antimicrobials:  Anti-infectives (From admission, onward)    Start     Dose/Rate Route Frequency Ordered Stop   06/30/22 2100  ceFAZolin (ANCEF) IVPB 2g/100 mL premix        2 g 200 mL/hr over 30 Minutes  Intravenous Every 8 hours 06/30/22 1642 07/01/22 2159   06/30/22 1100  ceFAZolin (ANCEF) IVPB 2g/100 mL premix        2 g 200 mL/hr over 30 Minutes Intravenous On call to O.R. 06/30/22 1005 06/30/22 1300   06/30/22 1008  ceFAZolin (ANCEF) 2-4 GM/100ML-% IVPB       Note to Pharmacy: Lurena Nida: cabinet override      06/30/22 1008 06/30/22 1315       Subjective: Seen and examined at bedside and was complaining of some left hip pain and was about to be transferred to [facility] for radiation therapy.  He denied any chest pain or shortness of breath.  States that he fell over the dog.  Denies any other concerns or  complaints at this time.  Objective: Vitals:   06/30/22 1739 07/01/22 0022 07/01/22 0151 07/01/22 0534  BP: (!) 140/100  117/79 112/78  Pulse: 100 (!) 110 100 96  Resp:  16 15 16   Temp:   97.9 F (36.6 C) 98.3 F (36.8 C)  TempSrc:      SpO2:  96% 95% 98%  Weight:      Height:        Intake/Output Summary (Last 24 hours) at 07/01/2022 0757 Last data filed at 07/01/2022 1610 Gross per 24 hour  Intake 2190 ml  Output 1550 ml  Net 640 ml   Filed Weights   06/30/22 1019  Weight: 77.1 kg   Examination: Physical Exam:  Constitutional: WN/WD overweight Caucasian male in no acute distress Respiratory: Diminished to auscultation bilaterally, no wheezing, rales, rhonchi or crackles. Normal respiratory effort and patient is not tachypenic. No accessory muscle use.  Unlabored breathing Cardiovascular: RRR, no murmurs / rubs / gallops. S1 and S2 auscultated. No extremity edema Abdomen: Soft, non-tender, distended secondary to body habitus. Bowel sounds positive.  GU: Deferred. Musculoskeletal: No clubbing / cyanosis of digits/nails. No joint deformity upper and lower extremities but has a PRAFO boot in place Skin: No rashes, lesions, ulcers on limited skin evaluation.. No induration; Warm and dry.  Neurologic: CN 2-12 grossly intact with no focal deficits.  Romberg sign and cerebellar reflexes not assessed.  Psychiatric: Normal judgment and insight. Alert and oriented x 3.  Data Reviewed: I have personally reviewed following labs and imaging studies  CBC: Recent Labs  Lab 06/29/22 2342 06/30/22 0913 07/01/22 0356  WBC 11.4* 13.3* 12.9*  NEUTROABS 6.3  --   --   HGB 12.5* 11.7* 10.3*  HCT 36.5* 33.4* 30.8*  MCV 93.1 92.5 96.9  PLT 481* 381 350   Basic Metabolic Panel: Recent Labs  Lab 06/29/22 2342 06/30/22 0913 07/01/22 0356  NA 134* 132* 133*  K 3.5 3.6 4.4  CL 97* 99 99  CO2 19* 20* 23  GLUCOSE 103* 88 123*  BUN 15 13 14   CREATININE 1.09 0.99 1.13  CALCIUM 10.0  9.3 9.4   GFR: Estimated Creatinine Clearance: 80.8 mL/min (by C-G formula based on SCr of 1.13 mg/dL). Liver Function Tests: Recent Labs  Lab 06/30/22 0913  AST 24  ALT 27  ALKPHOS 73  BILITOT 1.1  PROT 7.0  ALBUMIN 4.1   No results for input(s): "LIPASE", "AMYLASE" in the last 168 hours. No results for input(s): "AMMONIA" in the last 168 hours. Coagulation Profile: No results for input(s): "INR", "PROTIME" in the last 168 hours. Cardiac Enzymes: No results for input(s): "CKTOTAL", "CKMB", "CKMBINDEX", "TROPONINI" in the last 168 hours. BNP (last 3 results) No results for input(s): "  PROBNP" in the last 8760 hours. HbA1C: No results for input(s): "HGBA1C" in the last 72 hours. CBG: No results for input(s): "GLUCAP" in the last 168 hours. Lipid Profile: No results for input(s): "CHOL", "HDL", "LDLCALC", "TRIG", "CHOLHDL", "LDLDIRECT" in the last 72 hours. Thyroid Function Tests: No results for input(s): "TSH", "T4TOTAL", "FREET4", "T3FREE", "THYROIDAB" in the last 72 hours. Anemia Panel: No results for input(s): "VITAMINB12", "FOLATE", "FERRITIN", "TIBC", "IRON", "RETICCTPCT" in the last 72 hours. Sepsis Labs: No results for input(s): "PROCALCITON", "LATICACIDVEN" in the last 168 hours.  Recent Results (from the past 240 hour(s))  Surgical pcr screen     Status: Abnormal   Collection Time: 06/30/22  9:00 AM   Specimen: Nasal Mucosa; Nasal Swab  Result Value Ref Range Status   MRSA, PCR NEGATIVE NEGATIVE Final   Staphylococcus aureus POSITIVE (A) NEGATIVE Final    Comment: (NOTE) The Xpert SA Assay (FDA approved for NASAL specimens in patients 71 years of age and older), is one component of a comprehensive surveillance program. It is not intended to diagnose infection nor to guide or monitor treatment. Performed at [facility], [address], [place], [place] [address]     Radiology Studies: DG Pelvis Comp Min 3V  Result Date: 06/30/2022 CLINICAL DATA:   Status post ORIF acetabular fracture on the left EXAM: JUDET PELVIS - 3+ VIEW COMPARISON:  Intraoperative films from earlier in the same day. FINDINGS: Postsurgical changes are noted along the left acetabulum. Fracture fragments are in anatomic alignment. Proximal left femur appears within normal limits. Changes of prior mesh placement are noted. IMPRESSION: Status post ORIF left acetabular fracture. No alignment abnormalities are noted. Electronically Signed   By: Alcide Clever M.D.   On: 06/30/2022 20:15   DG Pelvis Comp Min 3V  Result Date: 06/30/2022 CLINICAL DATA:  Open reduction and internal fixation of left acetabular fracture. EXAM: JUDET PELVIS - 3+ VIEW; DG C-ARM 1-60 MIN-NO REPORT Radiation exposure index: 3.25 mGy. COMPARISON:  CT scan of same day. FINDINGS: Three intraoperative fluoroscopic images were obtained of the left hip. These demonstrate surgical internal fixation of left acetabular fracture. IMPRESSION: Fluoroscopic guidance provided during surgical internal fixation of left acetabular fracture. Electronically Signed   By: Lupita Raider M.D.   On: 06/30/2022 16:15   DG C-Arm 1-60 Min-No Report  Result Date: 06/30/2022 Fluoroscopy was utilized by the requesting physician.  No radiographic interpretation.   DG C-Arm 1-60 Min-No Report  Result Date: 06/30/2022 Fluoroscopy was utilized by the requesting physician.  No radiographic interpretation.   DG HIP UNILAT WITH PELVIS 1V LEFT  Result Date: 06/30/2022 CLINICAL DATA:  Postoperative status. EXAM: DG HIP (WITH OR WITHOUT PELVIS) 1V*L* COMPARISON:  CT scan of same day. FINDINGS: No dislocation is noted. There appears to be improved alignment involving the posterior acetabular fracture noted on prior CT scan. No other bony abnormality is noted. IMPRESSION: No dislocation is noted. Probable improved alignment involving posterior acetabular fracture on the left. Electronically Signed   By: Lupita Raider M.D.   On: 06/30/2022 08:28    CT Hip Left Wo Contrast  Result Date: 06/30/2022 CLINICAL DATA:  Presented earlier today with left acetabular fracture with superior dislocation, subsequently the dislocation was reduced. EXAM: CT OF THE LEFT HIP WITHOUT CONTRAST TECHNIQUE: Multidetector CT imaging of the left hip was performed according to the standard protocol. Multiplanar CT image reconstructions were also generated. RADIATION DOSE REDUCTION: This exam was performed according to the departmental dose-optimization program  which includes automated exposure control, adjustment of the mA and/or kV according to patient size and/or use of iterative reconstruction technique. COMPARISON:  Left hip films before and after left hip dislocation reduction earlier today, CT abdomen and pelvis with IV contrast 08/29/2010. FINDINGS: Bones/Joint/Cartilage There is normal bone mineralization. There is an acute comminuted intra-articular fracture in the mid to outer/superior aspect of the posterior column of the left acetabulum. The main fracture fragment is displaced up to 1 cm posteriorly and mildly anteriorly rotated and is superiorly displaced as well, consistent with the history of the superior hip dislocation. There is bony debris in the joint space and scattered intra-articular gas, small hemarthrosis. Largest intra-articular bone fragment is in the medial central joint space. No breaks in the skin are identified. There are few bone islands in the left femoral head but no suspicious bone lesions. No other fracture is seen in the visualized bony pelvis. The visualized proximal left femur is intact. The femoral head contour appears maintained. Ligaments Suboptimally assessed by CT. Muscles and Tendons No intramuscular hematoma is seen. Area tendons are not well studied with noncontrast CT but no obvious tendon disruption is suspected. Soft tissues There is a small linear hematoma along the posterior to mid left pelvic side wall. No large hematoma is  seen. The prostate and visualized portions and bladder are unremarkable. Since 2012 there has been an interval left inguinal hernia repair. There is concern for retraction of the right testicle up into the inguinal canal. Clinical correlation advised. IMPRESSION: 1. Acute comminuted intra-articular fracture of the posterior column of the left acetabulum with up to 1 cm posterior displacement and mild anterior rotation, and mild superior displacement of the main fracture fragment. 2. Bony debris in the joint space with scattered intra-articular gas, small hemarthrosis. 3. Small linear hematoma along the posterior to mid left pelvic side wall. 4. No other fractures are seen in the visualized bony pelvis or of the proximal left femur. 5. Suspected retraction of the right testicle up into the inguinal canal. Electronically Signed   By: Almira Bar M.D.   On: 06/30/2022 03:15   DG Hip [place] or [place] Pelvis 1 View Left  Result Date: 06/30/2022 CLINICAL DATA:  Left hip reduction. EXAM: DG HIP (WITH OR WITHOUT PELVIS) 1V PORT LEFT COMPARISON:  Jun 30, 2022 (12:12 a.m.) FINDINGS: Acute fracture deformity is seen along the superior lateral aspect of the left acetabulum. There is no evidence of dislocation. There is no evidence of arthropathy or other focal bone abnormality. IMPRESSION: Acute fracture of the left acetabulum with interval reduction of the left hip dislocation seen on the prior study. Electronically Signed   By: Aram Candela M.D.   On: 06/30/2022 02:24   DG Hip Unilat W or Wo Pelvis 2-3 Views Left  Result Date: 06/30/2022 CLINICAL DATA:  Hip pain after fall. EXAM: DG HIP (WITH OR WITHOUT PELVIS) 2-3V LEFT COMPARISON:  None Available. FINDINGS: There is superior dislocation of the left hip with acetabular fracture and displaced fracture fragment. Is difficult to determine AP positioning, right hip is intact. No additional fracture seen. IMPRESSION: Left acetabular fracture with left hip  dislocation. These results were called by telephone at the time of interpretation on 06/30/2022 at 12:34 am to provider [facility] , who verbally acknowledged these results. Electronically Signed   By: Charlett Nose M.D.   On: 06/30/2022 00:36    Scheduled Meds:  amLODipine  10 mg Oral Daily   buPROPion  300 mg Oral Daily   busPIRone  5 mg Oral BID   Chlorhexidine Gluconate Cloth  6 each Topical Q0600   docusate sodium  100 mg Oral BID   enoxaparin (LOVENOX) injection  40 mg Subcutaneous Q24H   FLUoxetine  20 mg Oral Daily   folic acid  1 mg Oral Daily   gabapentin  100 mg Oral BID   losartan  100 mg Oral Daily   And   hydrochlorothiazide  25 mg Oral Daily   magnesium oxide  400 mg Oral BID   metoprolol succinate  25 mg Oral Daily   multivitamin with minerals  1 tablet Oral Daily   mupirocin ointment  1 Application Nasal BID   pantoprazole  40 mg Oral Daily   potassium chloride  10 mEq Oral Daily   pravastatin  40 mg Oral Daily   thiamine  100 mg Oral Daily   Or   thiamine  100 mg Intravenous Daily   Continuous Infusions:   ceFAZolin (ANCEF) IV 2 g (07/01/22 0544)   methocarbamol (ROBAXIN) IV      LOS: 1 day   Marguerita Merles, DO [facility] Available via Epic secure chat 7am-7pm After these hours, please refer to coverage provider listed on [email or web address] 07/01/2022, 7:57 AM

## 2022-07-01 NOTE — Progress Notes (Signed)
Orthopaedic Trauma Service Progress Note  Patient ID: Thomas Williamson MRN: [id number] DOB/AGE: [birth year] 47 y.o.  Subjective:  Pt doing better this am  Pain much improved No complaints  Denies any other injuries    ROS As above  Objective:   VITALS:   Vitals:   07/01/22 0534 07/01/22 0833 07/01/22 0908 07/01/22 1000  BP: 112/78 119/80 125/82   Pulse: 96 (!) 111 90   Resp: 16 16 13    Temp: 98.3 F (36.8 C) 98 F (36.7 C) 98.5 F (36.9 C)   TempSrc:  Oral Oral   SpO2: 98% 97% 96%   Weight:    78.7 kg  Height:        Estimated body mass index is 25.62 kg/m as calculated from the following:   Height as of this encounter: 5\' 9"  (1.753 m).   Weight as of this encounter: 78.7 kg.   Intake/Output      05/28 0701 05/29 0700 05/29 0701 05/30 0700   P.O. 440 240   I.V. (mL/kg) 1300 (16.9)    IV Piggyback 450    Total Intake(mL/kg) 2190 (28.4) 240 (3)   Urine (mL/kg/hr) 1250 300 (1)   Blood 300    Total Output 1550 300   Net +640 -60          LABS  Results for orders placed or performed during the hospital encounter of 06/30/22 (from the past 24 hour(s))  HIV Antibody (routine testing w rflx)     Status: None   Collection Time: 06/30/22  5:22 PM  Result Value Ref Range   HIV Screen 4th Generation wRfx Non Reactive Non Reactive  CBC     Status: Abnormal   Collection Time: 07/01/22  3:56 AM  Result Value Ref Range   WBC 12.9 (H) 4.0 - 10.5 K/uL   RBC 3.18 (L) 4.22 - 5.81 MIL/uL   Hemoglobin 10.3 (L) 13.0 - 17.0 g/dL   HCT 13.0 (L) 86.5 - 78.4 %   MCV 96.9 80.0 - 100.0 fL   MCH 32.4 26.0 - 34.0 pg   MCHC 33.4 30.0 - 36.0 g/dL   RDW 69.6 29.5 - 28.4 %   Platelets 350 150 - 400 K/uL   nRBC 0.0 0.0 - 0.2 %  Basic metabolic panel     Status: Abnormal   Collection Time: 07/01/22  3:56 AM  Result Value Ref Range   Sodium 133 (L) 135 - 145 mmol/L   Potassium 4.4 3.5 - 5.1 mmol/L    Chloride 99 98 - 111 mmol/L   CO2 23 22 - 32 mmol/L   Glucose, Bld 123 (H) 70 - 99 mg/dL   BUN 14 6 - 20 mg/dL   Creatinine, Ser 1.32 0.61 - 1.24 mg/dL   Calcium 9.4 8.9 - 44.0 mg/dL   GFR, Estimated >10 >27 mL/min   Anion gap 11 5 - 15  VITAMIN D 25 Hydroxy (Vit-D Deficiency, Fractures)     Status: Abnormal   Collection Time: 07/01/22  3:56 AM  Result Value Ref Range   Vit D, 25-Hydroxy 10.13 (L) 30 - 100 ng/mL     PHYSICAL EXAM:   Gen: resting comfortably in bed, NAD, pleasant Lungs: unlabored  Cardiac: reg Abd: NT, + BS  Ext:       Left Lower Extremity  Dressing L hip clean, dry and intact  PRAFO boot in place              Ext warm              + DP pulse             No DCT             Compartments are soft             SPN, TN sensation intact  Diminished DPN sensation             no EHL or ankle extension noted  Ankle and toe flexion intact    Assessment/Plan: 1 Day Post-Op    Anti-infectives (From admission, onward)    Start     Dose/Rate Route Frequency Ordered Stop   06/30/22 2100  ceFAZolin (ANCEF) IVPB 2g/100 mL premix        2 g 200 mL/hr over 30 Minutes Intravenous Every 8 hours 06/30/22 1642 07/01/22 2159   06/30/22 1100  ceFAZolin (ANCEF) IVPB 2g/100 mL premix        2 g 200 mL/hr over 30 Minutes Intravenous On call to O.R. 06/30/22 1005 06/30/22 1300   06/30/22 1008  ceFAZolin (ANCEF) 2-4 GM/100ML-% IVPB       Note to Pharmacy: Jamelle Rushing, GRETA: cabinet override      06/30/22 1008 06/30/22 1315     .  POD/HD#: 25  47 year old male s/p with left acetabulum fracture dislocation  -Closed left transverse posterior wall acetabular fracture dislocation with foot drop s/p ORIF L acetabulum Weightbearing Touchdown weightbearing left leg using crutches or walker.  Will advance weightbearing in about 8 weeks   ROM/Activity   Posterior hip precautions for 12 weeks.  Activity as tolerated while maintaining weightbearing restrictions and range  of motion precautions.   Wound care   Daily dressing changes starting on 07/03/2018 for the left hip  PT and OT evaluations  XRT for heterotopic ossification prophylaxis has been arranged at [facility] long radiation oncology center.  Patient to receive treatment today  I will place order for [employer] to start the process for AFO for his foot drop.  We discussed potential long recovery process for this.  - Pain management:  Multimodal  Encourage oral medications over IV - ABL anemia/Hemodynamics  Stable, monitor - Medical issues   Per primary - DVT/PE prophylaxis:  Lovenox and SCDs while inpatient  Discharged on Eliquis 2.5 mg p.o. twice daily for 30 days - ID:   Perioperative antibiotics - Metabolic Bone Disease:  Suspect metabolic bone disease due to his chronic alcoholism.  Labs show vitamin D deficiency.  Supplement.  Will also check a testosterone panel in the setting of chronic alcoholism and marijuana use  - Activity:  As above  - Impediments to fracture healing:  Chronic alcohol use  Nicotine dependence  Regular marijuana use  Vitamin D deficiency  - Dispo:  Ortho issues addressed.  Therapy evaluations     Mearl Latin, PA-C [phone] (C) 07/01/2022, 11:01 AM  [facility] [address] [place] [address] [phone] (F)    After 5pm and on the weekends please log on to Amion, go to orthopaedics and the look under the [facility] Call for the provider(s) on call. You can also call our office at [phone] and then follow the prompts to be connected to the call team.  Patient ID: Thomas Williamson, male   DOB:  08/27/1975, 47 y.o.   MRN: [id number]

## 2022-07-01 NOTE — Progress Notes (Signed)
OT Cancellation Note  Patient Details Name: Hunter Tringale MRN: [id number] DOB: 03-04-1975   Cancelled Treatment:    Reason Eval/Treat Not Completed: Patient at procedure or test/ unavailable (Pt still at [facility] for Radiology procedure. OT will continue to f/u with patient as able.)  07/01/2022  AB, OTR/L  Acute Rehabilitation Services  Office: [phone]  [name] 07/01/2022, 1:56 PM

## 2022-07-01 NOTE — Progress Notes (Signed)
Pt. Transported to [facility] via [facility]

## 2022-07-01 NOTE — Progress Notes (Signed)
PT Cancellation Note  Patient Details Name: Roudy Bonnette MRN: [id number] DOB: 1975-11-06   Cancelled Treatment:    Reason Eval/Treat Not Completed: Patient at procedure or test/unavailable; [facility] for radiation;   Will follow up later today as time allows;  Otherwise, will follow up for PT tomorrow;   Thank you,  Van Clines, PT  Acute Rehabilitation Services Office [phone]      Levi Aland 07/01/2022, 10:38 AM

## 2022-07-01 NOTE — Consult Note (Signed)
Radiation Oncology         [phone] ________________________________  Name: Thomas Williamson MRN: [id number]  Date of Service: 07/01/22 DOB: 08-Apr-1975  CC:Fry, Tera Mater, MD     REFERRING PHYSICIAN: Dr. Carola Frost   DIAGNOSIS: left acetabular fracture  HISTORY OF PRESENT ILLNESS:Thomas Williamson is a 47 y.o. male who is seen for an initial consultation visit regarding the patient's diagnosis of acetabular fracture.  The patient was admitted after a fall down steps. The patient was found to have suffered an acetabular fracture and surgery has been recommended for the patient.  Given the nature of the injury and plan intervention, the patient is felt to be at significant risk for the development of heterotopic ossification. We have therefore been asked to see the patient today for consideration of postoperative radiation treatment for the prevention of heterotopic ossification postoperatively. He has undergone an open reduction, and internal fixation of the left hip on 06/30/22.    PREVIOUS RADIATION THERAPY: No   PAST MEDICAL HISTORY:  has a past medical history of Alcohol use disorder, severe, in early remission (HCC) (03/30/2018), Allergy, Anxiety, Bipolar disorder, in partial remission, most recent episode hypomanic (HCC) (03/30/2018), Bleeding internal hemorrhoids (09/12/2013), Cannabis dependence (HCC) (07/13/2018), Cervical disc disease (11/20/2014), Chronic anxiety (07/13/2018), Chronic systolic (congestive) heart failure (HCC), Complication of anesthesia, Depression, Dyslipidemia, GERD (gastroesophageal reflux disease), Hernia, inguinal, bilateral (02/24/2011), High ankle sprain of right lower extremity (01/16/2015), History of hiatal hernia, hepatitis C (10/05/2017), Hypertension, Lipids abnormal (09/29/2013), Pneumonia, and PONV (postoperative nausea and vomiting).     PAST SURGICAL HISTORY: Past Surgical History:  Procedure Laterality Date   ANTERIOR CRUCIATE LIGAMENT REPAIR  2010   BIOPSY   03/13/2019   Procedure: BIOPSY;  Surgeon: Benancio Deeds, MD;  Location: [facility];  Service: Gastroenterology;;   COLONOSCOPY N/A 03/13/2019   per Dr. Adela Lank, adenomatous polyps, repeat in 3 yrs   INGUINAL HERNIA REPAIR  02/23/2012   Procedure: LAPAROSCOPIC BILATERAL INGUINAL HERNIA REPAIR;  Surgeon: Kandis Cocking, MD;  Location: [facility] ORS;  Service: General;  Laterality: Bilateral;  Laparoscopic Bilateral Inguinal Hernia Repair with mesh   INSERTION OF MESH  02/23/2012   Procedure: INSERTION OF MESH;  Surgeon: Kandis Cocking, MD;  Location: [facility] ORS;  Service: General;  Laterality: N/A;   NOSE SURGERY  2956,2130   POLYPECTOMY  03/13/2019   Procedure: POLYPECTOMY;  Surgeon: Benancio Deeds, MD;  Location: [facility]OS[facility];  Service: Gastroenterology;;     FAMILY HISTORY: family history includes Atrial fibrillation in his mother; Cancer in his father; Congestive Heart Failure in his paternal grandmother; Diabetes in his brother and father; Heart attack in his maternal grandfather; Hypertension in his father and mother; Learning disabilities in his paternal aunt; Leukemia (age of onset: 20) in his father; Lung cancer in his maternal grandmother; Other in his brother; Prostate cancer (age of onset: 51) in his father.    SOCIAL HISTORY:  reports that he has been smoking e-cigarettes. He has never used smokeless tobacco. He reports current alcohol use of about 4.0 standard drinks of alcohol per week. He reports current drug use. Frequency: 2.00 times per week. Drug: Marijuana. The patient is single and lives in [place]. 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   ALLERGIES: Hydrocodone   MEDICATIONS:  Current Facility-Administered Medications  Medication Dose Route Frequency Provider Last Rate Last Admin   amLODipine (NORVASC) tablet 10 mg  10 mg Oral Daily  Jonah Blue, MD   10 mg at 06/30/22 1745   bisacodyl (DULCOLAX) EC tablet  5 mg  5 mg Oral Daily PRN Montez Morita, PA-C       buPROPion (WELLBUTRIN XL) 24 hr tablet 300 mg  300 mg Oral Daily Jonah Blue, MD   300 mg at 06/30/22 1746   busPIRone (BUSPAR) tablet 5 mg  5 mg Oral BID Montez Morita, PA-C   5 mg at 06/30/22 2159   ceFAZolin (ANCEF) IVPB 2g/100 mL premix  2 g Intravenous Q8H Montez Morita, PA-C 200 mL/hr at 07/01/22 0544 2 g at 07/01/22 0544   Chlorhexidine Gluconate Cloth 2 % PADS 6 each  6 each Topical Q0600 Jonah Blue, MD   6 each at 07/01/22 0551   docusate sodium (COLACE) capsule 100 mg  100 mg Oral BID Montez Morita, PA-C       enoxaparin (LOVENOX) injection 40 mg  40 mg Subcutaneous Q24H Montez Morita, PA-C       FLUoxetine (PROZAC) capsule 20 mg  20 mg Oral Daily Jonah Blue, MD   20 mg at 06/30/22 1745   folic acid (FOLVITE) tablet 1 mg  1 mg Oral Daily Montez Morita, PA-C       gabapentin (NEURONTIN) capsule 100 mg  100 mg Oral BID Montez Morita, PA-C   100 mg at 06/30/22 2159   LORazepam (ATIVAN) tablet 1-4 mg  1-4 mg Oral Q1H PRN Montez Morita, PA-C   1 mg at 06/30/22 1745   Or   LORazepam (ATIVAN) injection 1-4 mg  1-4 mg Intravenous Q1H PRN Montez Morita, PA-C   2 mg at 07/01/22 0549   losartan (COZAAR) tablet 100 mg  100 mg Oral Daily Jonah Blue, MD   100 mg at 06/30/22 1745   magnesium oxide (MAG-OX) tablet 400 mg  400 mg Oral BID Jonah Blue, MD   400 mg at 06/30/22 2159   methocarbamol (ROBAXIN) 1,000 mg in dextrose 5 % 100 mL IVPB  1,000 mg Intravenous Q6H PRN Montez Morita, PA-C       metoCLOPramide (REGLAN) tablet 5-10 mg  5-10 mg Oral Q8H PRN Montez Morita, PA-C       Or   metoCLOPramide (REGLAN) injection 5-10 mg  5-10 mg Intravenous Q8H PRN Montez Morita, PA-C       metoprolol succinate (TOPROL-XL) 24 hr tablet 25 mg  25 mg Oral Daily Jonah Blue, MD   25 mg at 06/30/22 1745   morphine (PF) 2 MG/ML injection 2 mg  2 mg Intravenous Q2H PRN Montez Morita, PA-C       multivitamin with minerals tablet 1 tablet  1 tablet Oral Daily Montez Morita, PA-C       mupirocin ointment (BACTROBAN) 2 % 1 Application  1 Application Nasal BID Jonah Blue, MD       ondansetron [facility]) injection 4 mg  4 mg Intravenous Q6H PRN Montez Morita, PA-C       ondansetron [facility]) tablet 4 mg  4 mg Oral Q6H PRN Montez Morita, PA-C       oxyCODONE (Oxy IR/ROXICODONE) immediate release tablet 5-15 mg  5-15 mg Oral Q4H PRN Montez Morita, PA-C   15 mg at 07/01/22 0328   pantoprazole (PROTONIX) EC tablet 40 mg  40 mg Oral Daily Jonah Blue, MD   40 mg at 06/30/22 1746   polyethylene glycol (MIRALAX / GLYCOLAX) packet 17 g  17 g Oral Daily PRN Montez Morita, PA-C       potassium  chloride (KLOR-CON M) CR tablet 10 mEq  10 mEq Oral Daily Jonah Blue, MD   10 mEq at 06/30/22 1746   pravastatin (PRAVACHOL) tablet 40 mg  40 mg Oral Daily Jonah Blue, MD   40 mg at 06/30/22 1745   thiamine (VITAMIN B1) tablet 100 mg  100 mg Oral Daily Montez Morita, PA-C       Or   thiamine (VITAMIN B1) injection 100 mg  100 mg Intravenous Daily Montez Morita, PA-C         REVIEW OF SYSTEMS:  On review of systems, the patient reports that he is doing pretty well. He did not sleep well overnight but feels like his pain is pretty well controlled in the left hip.     PHYSICAL EXAM:  height is 5\' 9"  (1.753 m) and weight is 170 lb (77.1 kg). His temperature is 98.3 F (36.8 C). His blood pressure is 112/78 and his pulse is 96. His respiration is 16 and oxygen saturation is 98%.   In general this is a well appearing caucasian male in no acute distress. He's alert and oriented x4 and appropriate throughout the examination. Cardiopulmonary assessment is negative for acute distress and he exhibits normal effort.  His surgical dressings are not disrupted.   ECOG = 4  0 - Asymptomatic (Fully active, able to carry on all predisease activities without restriction)  1 - Symptomatic but completely ambulatory (Restricted in physically strenuous activity but ambulatory and able to carry out  work of a light or sedentary nature. For example, light housework, office work)  2 - Symptomatic, <50% in bed during the day (Ambulatory and capable of all self care but unable to carry out any work activities. Up and about more than 50% of waking hours)  3 - Symptomatic, >50% in bed, but not bedbound (Capable of only limited self-care, confined to bed or chair 50% or more of waking hours)  4 - Bedbound (Completely disabled. Cannot carry on any self-care. Totally confined to bed or chair)  5 - Death   Santiago Glad MM, Creech RH, Tormey DC, et al. [phone]). "Toxicity and response criteria of the [facility]". Am. Evlyn Clines. Oncol. 5 (6): 649-55    LABORATORY DATA:  Lab Results  Component Value Date   WBC 12.9 (H) 07/01/2022   HGB 10.3 (L) 07/01/2022   HCT 30.8 (L) 07/01/2022   MCV 96.9 07/01/2022   PLT 350 07/01/2022   Lab Results  Component Value Date   NA 133 (L) 07/01/2022   K 4.4 07/01/2022   CL 99 07/01/2022   CO2 23 07/01/2022   Lab Results  Component Value Date   ALT 27 06/30/2022   AST 24 06/30/2022   ALKPHOS 73 06/30/2022   BILITOT 1.1 06/30/2022      RADIOGRAPHY: DG Pelvis Comp Min 3V  Result Date: 06/30/2022 CLINICAL DATA:  Status post ORIF acetabular fracture on the left EXAM: JUDET PELVIS - 3+ VIEW COMPARISON:  Intraoperative films from earlier in the same day. FINDINGS: Postsurgical changes are noted along the left acetabulum. Fracture fragments are in anatomic alignment. Proximal left femur appears within normal limits. Changes of prior mesh placement are noted. IMPRESSION: Status post ORIF left acetabular fracture. No alignment abnormalities are noted. Electronically Signed   By: Alcide Clever M.D.   On: 06/30/2022 20:15   DG Pelvis Comp Min 3V  Result Date: 06/30/2022 CLINICAL DATA:  Open reduction and internal fixation of left acetabular fracture. EXAM: JUDET PELVIS -  3+ VIEW; DG C-ARM 1-60 MIN-NO REPORT Radiation exposure index: 3.25 mGy.  COMPARISON:  CT scan of same day. FINDINGS: Three intraoperative fluoroscopic images were obtained of the left hip. These demonstrate surgical internal fixation of left acetabular fracture. IMPRESSION: Fluoroscopic guidance provided during surgical internal fixation of left acetabular fracture. Electronically Signed   By: Lupita Raider M.D.   On: 06/30/2022 16:15   DG C-Arm 1-60 Min-No Report  Result Date: 06/30/2022 Fluoroscopy was utilized by the requesting physician.  No radiographic interpretation.   DG C-Arm 1-60 Min-No Report  Result Date: 06/30/2022 Fluoroscopy was utilized by the requesting physician.  No radiographic interpretation.   DG HIP UNILAT WITH PELVIS 1V LEFT  Result Date: 06/30/2022 CLINICAL DATA:  Postoperative status. EXAM: DG HIP (WITH OR WITHOUT PELVIS) 1V*L* COMPARISON:  CT scan of same day. FINDINGS: No dislocation is noted. There appears to be improved alignment involving the posterior acetabular fracture noted on prior CT scan. No other bony abnormality is noted. IMPRESSION: No dislocation is noted. Probable improved alignment involving posterior acetabular fracture on the left. Electronically Signed   By: Lupita Raider M.D.   On: 06/30/2022 08:28   CT Hip Left Wo Contrast  Result Date: 06/30/2022 CLINICAL DATA:  Presented earlier today with left acetabular fracture with superior dislocation, subsequently the dislocation was reduced. EXAM: CT OF THE LEFT HIP WITHOUT CONTRAST TECHNIQUE: Multidetector CT imaging of the left hip was performed according to the standard protocol. Multiplanar CT image reconstructions were also generated. RADIATION DOSE REDUCTION: This exam was performed according to the departmental dose-optimization program which includes automated exposure control, adjustment of the mA and/or kV according to patient size and/or use of iterative reconstruction technique. COMPARISON:  Left hip films before and after left hip dislocation reduction earlier  today, CT abdomen and pelvis with IV contrast 08/29/2010. FINDINGS: Bones/Joint/Cartilage There is normal bone mineralization. There is an acute comminuted intra-articular fracture in the mid to outer/superior aspect of the posterior column of the left acetabulum. The main fracture fragment is displaced up to 1 cm posteriorly and mildly anteriorly rotated and is superiorly displaced as well, consistent with the history of the superior hip dislocation. There is bony debris in the joint space and scattered intra-articular gas, small hemarthrosis. Largest intra-articular bone fragment is in the medial central joint space. No breaks in the skin are identified. There are few bone islands in the left femoral head but no suspicious bone lesions. No other fracture is seen in the visualized bony pelvis. The visualized proximal left femur is intact. The femoral head contour appears maintained. Ligaments Suboptimally assessed by CT. Muscles and Tendons No intramuscular hematoma is seen. Area tendons are not well studied with noncontrast CT but no obvious tendon disruption is suspected. Soft tissues There is a small linear hematoma along the posterior to mid left pelvic side wall. No large hematoma is seen. The prostate and visualized portions and bladder are unremarkable. Since 2012 there has been an interval left inguinal hernia repair. There is concern for retraction of the right testicle up into the inguinal canal. Clinical correlation advised. IMPRESSION: 1. Acute comminuted intra-articular fracture of the posterior column of the left acetabulum with up to 1 cm posterior displacement and mild anterior rotation, and mild superior displacement of the main fracture fragment. 2. Bony debris in the joint space with scattered intra-articular gas, small hemarthrosis. 3. Small linear hematoma along the posterior to mid left pelvic side wall. 4. No other fractures are seen in  the visualized bony pelvis or of the proximal left  femur. 5. Suspected retraction of the right testicle up into the inguinal canal. Electronically Signed   By: Almira Bar M.D.   On: 06/30/2022 03:15   DG Hip [place] or [place] Pelvis 1 View Left  Result Date: 06/30/2022 CLINICAL DATA:  Left hip reduction. EXAM: DG HIP (WITH OR WITHOUT PELVIS) 1V PORT LEFT COMPARISON:  Jun 30, 2022 (12:12 a.m.) FINDINGS: Acute fracture deformity is seen along the superior lateral aspect of the left acetabulum. There is no evidence of dislocation. There is no evidence of arthropathy or other focal bone abnormality. IMPRESSION: Acute fracture of the left acetabulum with interval reduction of the left hip dislocation seen on the prior study. Electronically Signed   By: Aram Candela M.D.   On: 06/30/2022 02:24   DG Hip Unilat W or Wo Pelvis 2-3 Views Left  Result Date: 06/30/2022 CLINICAL DATA:  Hip pain after fall. EXAM: DG HIP (WITH OR WITHOUT PELVIS) 2-3V LEFT COMPARISON:  None Available. FINDINGS: There is superior dislocation of the left hip with acetabular fracture and displaced fracture fragment. Is difficult to determine AP positioning, right hip is intact. No additional fracture seen. IMPRESSION: Left acetabular fracture with left hip dislocation. These results were called by telephone at the time of interpretation on 06/30/2022 at 12:34 am to provider [facility] , who verbally acknowledged these results. Electronically Signed   By: Charlett Nose M.D.   On: 06/30/2022 00:36   LONG TERM MONITOR (3-14 DAYS)  Result Date: 06/23/2022 Patch Wear Time:  9 days and 17 hours (2024-04-22T18:49:59-0400 to 2024-05-02T12:19:32-0400) Patient had a min HR of 57 bpm, max HR of 157 bpm, and avg HR of 83 bpm. Predominant underlying rhythm was Sinus Rhythm. Bundle Branch Block/IVCD was present. QRS morphology changes were present throughout recording. 1 run of Supraventricular Tachycardia  occurred lasting 11.5 secs with a max rate of 141 bpm (avg 122 bpm). Isolated  SVEs were rare (<1.0%), SVE Couplets were rare (<1.0%), and SVE Triplets were rare (<1.0%). No Isolated VEs, VE Couplets, or VE Triplets were present. Symptoms associated with sinus rhythm. 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 Marland Kitchen       IMPRESSION:  The patient has been diagnosed with a acetabular fracture of the left hip. DrMitzi Hansen has reviewed his course and feels that the  patient is a good candidate for one fraction of postoperative radiation treatment for the prevention of the development of heterotopic ossification.  I have discussed the rationale of this treatment with the patient. We have discussed the possible/expected benefit of such a treatment. I have also discussed the possible side effects and risks of treatment as well. All of the patient's questions have been answered.    PLAN: The patient will undergo simulation and one fraction of external beam radiation treatment. This will be completed to a dose of 7 Gy. This treatment will be completed on postoperative day #1. After discussion regarding desires for fertility since treatment can temporarily or permanently alter sperm production, he elects for testicular shielding to minimize long term risks of changes in fertility. Written consent is obtained and placed in the chart, a copy was provided to the patient.      In a visit lasting 60 minutes, greater than 50% of the time was spent face to face and in floor time discussing the patient's condition, in preparation for the discussion, and coordinating the patient's care.  Osker Mason, [facility]    **Disclaimer: This note was dictated with voice recognition software. Similar sounding words can inadvertently be transcribed and this note may contain transcription errors which may not have been corrected upon publication of note.**

## 2022-07-01 NOTE — Plan of Care (Signed)
  Problem: Education: Goal: Knowledge of General Education information will improve Description: Including pain rating scale, medication(s)/side effects and non-pharmacologic comfort measures Outcome: Not Progressing   Problem: Health Behavior/Discharge Planning: Goal: Ability to manage health-related needs will improve Outcome: Not Progressing   

## 2022-07-01 NOTE — Anesthesia Postprocedure Evaluation (Signed)
Anesthesia Post Note  Patient: Thomas Williamson  Procedure(s) Performed: OPEN REDUCTION INTERNAL FIXATION (ORIF) ACETABULAR FRACTURE (Left: Shoulder)     Patient location during evaluation: PACU Anesthesia Type: General Level of consciousness: awake and alert Pain management: pain level controlled Vital Signs Assessment: post-procedure vital signs reviewed and stable Respiratory status: spontaneous breathing, nonlabored ventilation, respiratory function stable and patient connected to nasal cannula oxygen Cardiovascular status: blood pressure returned to baseline and stable Postop Assessment: no apparent nausea or vomiting Anesthetic complications: no   No notable events documented.  Last Vitals:  Vitals:   07/01/22 0151 07/01/22 0534  BP: 117/79 112/78  Pulse: 100 96  Resp: 15 16  Temp: 36.6 C 36.8 C  SpO2: 95% 98%    Last Pain:  Vitals:   07/01/22 0413  TempSrc:   PainSc: Asleep                 Sheily Lineman

## 2022-07-01 NOTE — Evaluation (Signed)
Occupational Therapy Evaluation Patient Details Name: Thomas Williamson MRN: [id number] DOB: 1975-12-19 Today's Date: 07/01/2022   History of Present Illness 47 y.o. male presenting after a fall when tripping over his dog. He was found to have left acetabular fracture and hip location, is now s/p ORIF L acetabulum fracture dislocation with foot drop 06/30/22. PMH significant of ETOH use d/o, bipolar d/o, chronic systolic CHF, and HTN.   Clinical Impression   PTA patient lived with parents, unsure of the level of physical assist they can provide, pt reports being independent in ADLs and ambulation. Pt currently presenting with impaired balance following new WB precautions and posterior hip precautions. Pt educated on posterior hip precautions but demonstrated decreased carryover of knowledge by end of OT session, he attempted to lift LLE to help with donning socks and iniated stand without extending L hip. Pt needs reinforcement of precautions and requires cueing throughout session. Pt would benefit from acute skilled OT services to address above deficits and help transition to next level of care. Patient would benefit from post acute Home OT services to help maximize functional independence in natural environment if parent's can provide necessary assist and if patient demonstrates improved carryover of precautions, if not then DC recs would change to SNF.      Recommendations for follow up therapy are one component of a multi-disciplinary discharge planning process, led by the attending physician.  Recommendations may be updated based on patient status, additional functional criteria and insurance authorization.   Assistance Recommended at Discharge Intermittent Supervision/Assistance  Patient can return home with the following A lot of help with bathing/dressing/bathroom;Assistance with cooking/housework;Assist for transportation    Functional Status Assessment  Patient has had a recent decline in their  functional status and demonstrates the ability to make significant improvements in function in a reasonable and predictable amount of time.  Equipment Recommendations  BSC/3in1;Other (comment) (Sock aid, reacher, long handled sponge, leg lifter)    Recommendations for Other Services       Precautions / Restrictions Precautions Precautions: Posterior Hip Precaution Booklet Issued: Yes (comment) Restrictions Weight Bearing Restrictions: Yes LLE Weight Bearing: Touchdown weight bearing      Mobility Bed Mobility Overal bed mobility: Needs Assistance Bed Mobility: Supine to Sit, Sit to Supine     Supine to sit: Min guard Sit to supine: Min guard   General bed mobility comments: Cues to extend R hip to maintain precautions    Transfers Overall transfer level: Needs assistance Equipment used: Rolling walker (2 wheels) Transfers: Sit to/from Stand Sit to Stand: Min guard           General transfer comment: Pt intially standing without extending R hip, informed patient on the need to maintain hip precautions and the mechanics of hip exension when standing      Balance Overall balance assessment: Needs assistance Sitting-balance support: Feet supported Sitting balance-Leahy Scale: Fair       Standing balance-Leahy Scale: Poor                             ADL either performed or assessed with clinical judgement   ADL Overall ADL's : Needs assistance/impaired Eating/Feeding: Independent;Sitting   Grooming: Standing;Minimal assistance   Upper Body Bathing: Sitting;Independent   Lower Body Bathing: Sitting/lateral leans;Moderate assistance   Upper Body Dressing : Sitting;Independent   Lower Body Dressing: With adaptive equipment;Sitting/lateral leans;Min guard   Toilet Transfer: Ambulation;Minimal assistance   Toileting- Clothing Manipulation and  Hygiene: Sitting/lateral lean;Minimal assistance   Tub/ Engineer, structural: Ambulation;Minimal assistance    Functional mobility during ADLs: Minimal assistance;Rolling walker (2 wheels) (steps at bedside with WB precautions) General ADL Comments: Educated pt on WB precautions     Vision         Perception     Praxis      Pertinent Vitals/Pain Pain Assessment Pain Assessment: 0-10 Pain Score: 7  Pain Location: LLE and hip Pain Descriptors / Indicators: Aching, Jabbing, Pounding Pain Intervention(s): Limited activity within patient's tolerance, Monitored during session, Premedicated before session     Hand Dominance Right   Extremity/Trunk Assessment Upper Extremity Assessment Upper Extremity Assessment: Overall WFL for tasks assessed   Lower Extremity Assessment Lower Extremity Assessment: RLE deficits/detail;Defer to PT evaluation RLE Deficits / Details: Decreased sensation along the top of foot RLE: Unable to fully assess due to immobilization RLE Sensation: decreased light touch   Cervical / Trunk Assessment Cervical / Trunk Assessment: Normal   Communication Communication Communication: No difficulties   Cognition Arousal/Alertness: Awake/alert Behavior During Therapy: Impulsive Overall Cognitive Status: Within Functional Limits for tasks assessed                                       General Comments  VSS on RA, HR resting between 113-122 when EOB    Exercises     Shoulder Instructions      Home Living Family/patient expects to be discharged to:: Private residence Living Arrangements: Parent Available Help at Discharge: Family;Available 24 hours/day Type of Home: House Home Access: Stairs to enter [employer] of Steps: 3 from front entrance, half step from garage Entrance Stairs-Rails: Can reach both;Right;Left Home Layout: Two level;Able to live on main level with bedroom/bathroom Alternate Level Stairs-Number of Steps: 10 steps Alternate Level Stairs-Rails: Can reach both;Right;Left Bathroom Shower/Tub: Company secretary: Standard     Home Equipment: Shower seat - built in          Prior Functioning/Environment Prior Level of Function : Working/employed;Driving;Independent/Modified Independent                        OT Problem List: Pain;Impaired balance (sitting and/or standing);Decreased knowledge of precautions;Decreased safety awareness      OT Treatment/Interventions: Therapeutic exercise;DME and/or AE instruction;Therapeutic activities;Patient/family education;Energy conservation;Self-care/ADL training    OT Goals(Current goals can be found in the care plan section) Acute Rehab OT Goals Patient Stated Goal: To get back home OT Goal Formulation: With patient Time For Goal Achievement: 07/15/22 Potential to Achieve Goals: Good ADL Goals Pt Will Perform Grooming: standing;with supervision Pt Will Perform Lower Body Bathing: with adaptive equipment;sitting/lateral leans;with supervision;with set-up Pt Will Perform Lower Body Dressing: with adaptive equipment;with supervision;with set-up;sitting/lateral leans Pt Will Transfer to Toilet: ambulating;with supervision;bedside commode Additional ADL Goal #1: Pt will verbally recall 3/3 posterior hip precautions without cueing  OT Frequency: Min 3X/week    Co-evaluation              AM-PAC OT "6 Clicks" Daily Activity     Outcome Measure Help from another person eating meals?: None Help from another person taking care of personal grooming?: A Little Help from another person toileting, which includes using toliet, bedpan, or urinal?: A Little Help from another person bathing (including washing, rinsing, drying)?: A Lot Help from another person to put on and taking off regular  upper body clothing?: None Help from another person to put on and taking off regular lower body clothing?: A Little (with AE) 6 Click Score: 19   End of Session Equipment Utilized During Treatment: Gait belt;Rolling walker (2 wheels) Nurse  Communication: Mobility status  Activity Tolerance: Patient tolerated treatment well Patient left: in bed;with call bell/phone within reach;Other (comment) (abduction wedge in place)  OT Visit Diagnosis: Unsteadiness on feet (R26.81);Pain;Other abnormalities of gait and mobility (R26.89) Pain - Right/Left: Left Pain - part of body: Hip;Leg                Time: 1610-9604 OT Time Calculation (min): 35 min Charges:  OT General Charges $OT Visit: 1 Visit OT Evaluation $OT Eval Moderate Complexity: 1 Mod OT Treatments $Self Care/Home Management : 8-22 mins  07/01/2022  AB, OTR/L  Acute Rehabilitation Services  Office: [phone]   [name] 07/01/2022, 5:10 PM

## 2022-07-01 NOTE — Progress Notes (Signed)
Orthopedic Tech Progress Note Patient Details:  Thomas Williamson Jun 09, 1975 [id number]  Ortho Devices Type of Ortho Device: Prafo boot/shoe Ortho Device/Splint Location: LLE Ortho Device/Splint Interventions: Ordered, Application, Adjustment   Post Interventions Patient Tolerated: Well Instructions Provided: Care of device  [name] 07/01/2022, 8:52 AM

## 2022-07-01 NOTE — Progress Notes (Signed)
Orthopedic Tech Progress Note Patient Details:  Thomas Williamson 1975-03-09 [id number]  Ortho Devices Type of Ortho Device: Prafo boot/shoe Ortho Device/Splint Location: LLE Ortho Device/Splint Interventions: Ordered, Application, Adjustment   Post Interventions Patient Tolerated: Well Instructions Provided: Care of device  [name] 07/01/2022, 8:51 AM

## 2022-07-01 NOTE — Progress Notes (Signed)
PT. Arrived to unit alert and oriented c/o pain. Visible tremors of bilateral upper extremities. Bed in lowset position call bell within reach.

## 2022-07-02 ENCOUNTER — Inpatient Hospital Stay (HOSPITAL_COMMUNITY): Payer: BC Managed Care – PPO

## 2022-07-02 DIAGNOSIS — S32402A Unspecified fracture of left acetabulum, initial encounter for closed fracture: Secondary | ICD-10-CM | POA: Diagnosis not present

## 2022-07-02 DIAGNOSIS — F419 Anxiety disorder, unspecified: Secondary | ICD-10-CM | POA: Diagnosis not present

## 2022-07-02 DIAGNOSIS — R410 Disorientation, unspecified: Secondary | ICD-10-CM

## 2022-07-02 DIAGNOSIS — I10 Essential (primary) hypertension: Secondary | ICD-10-CM

## 2022-07-02 DIAGNOSIS — F1029 Alcohol dependence with unspecified alcohol-induced disorder: Secondary | ICD-10-CM | POA: Diagnosis not present

## 2022-07-02 DIAGNOSIS — I426 Alcoholic cardiomyopathy: Secondary | ICD-10-CM | POA: Diagnosis not present

## 2022-07-02 LAB — COMPREHENSIVE METABOLIC PANEL
ALT: 18 U/L (ref 0–44)
AST: 29 U/L (ref 15–41)
Albumin: 3.9 g/dL (ref 3.5–5.0)
Alkaline Phosphatase: 53 U/L (ref 38–126)
Anion gap: 14 (ref 5–15)
BUN: 25 mg/dL — ABNORMAL HIGH (ref 6–20)
CO2: 23 mmol/L (ref 22–32)
Calcium: 9.5 mg/dL (ref 8.9–10.3)
Chloride: 98 mmol/L (ref 98–111)
Creatinine, Ser: 1.35 mg/dL — ABNORMAL HIGH (ref 0.61–1.24)
GFR, Estimated: >60 mL/min (ref >60)
Glucose, Bld: 100 mg/dL — ABNORMAL HIGH (ref 70–99)
Potassium: 3.9 mmol/L (ref 3.5–5.1)
Sodium: 135 mmol/L (ref 135–145)
Total Bilirubin: 0.5 mg/dL (ref 0.3–1.2)
Total Protein: 7.1 g/dL (ref 6.5–8.1)

## 2022-07-02 LAB — CBC WITH DIFFERENTIAL/PLATELET
Abs Immature Granulocytes: 0.06 10*3/uL (ref 0.00–0.07)
Basophils Absolute: 0 10*3/uL (ref 0.0–0.1)
Basophils Relative: 0 %
Eosinophils Absolute: 0 10*3/uL (ref 0.0–0.5)
Eosinophils Relative: 0 %
HCT: 27.6 % — ABNORMAL LOW (ref 39.0–52.0)
Hemoglobin: 9.2 g/dL — ABNORMAL LOW (ref 13.0–17.0)
Immature Granulocytes: 0 %
Lymphocytes Relative: 9 %
Lymphs Abs: 1.3 10*3/uL (ref 0.7–4.0)
MCH: 32.3 pg (ref 26.0–34.0)
MCHC: 33.3 g/dL (ref 30.0–36.0)
MCV: 96.8 fL (ref 80.0–100.0)
Monocytes Absolute: 1.3 10*3/uL — ABNORMAL HIGH (ref 0.1–1.0)
Monocytes Relative: 9 %
Neutro Abs: 11.5 10*3/uL — ABNORMAL HIGH (ref 1.7–7.7)
Neutrophils Relative %: 82 %
Platelets: 333 10*3/uL (ref 150–400)
RBC: 2.85 MIL/uL — ABNORMAL LOW (ref 4.22–5.81)
RDW: 13 % (ref 11.5–15.5)
WBC: 14.2 10*3/uL — ABNORMAL HIGH (ref 4.0–10.5)
nRBC: 0 % (ref 0.0–0.2)

## 2022-07-02 LAB — RETICULOCYTES
Immature Retic Fract: 10.9 % (ref 2.3–15.9)
RBC.: 2.8 MIL/uL — ABNORMAL LOW (ref 4.22–5.81)
Retic Count, Absolute: 54.9 10*3/uL (ref 19.0–186.0)
Retic Ct Pct: 2 % (ref 0.4–3.1)

## 2022-07-02 LAB — PHOSPHORUS: Phosphorus: 4.3 mg/dL (ref 2.5–4.6)

## 2022-07-02 LAB — FOLATE: Folate: 17 ng/mL (ref >5.9)

## 2022-07-02 LAB — IRON AND TIBC
Iron: 13 ug/dL — ABNORMAL LOW (ref 45–182)
Saturation Ratios: 4 % — ABNORMAL LOW (ref 17.9–39.5)
TIBC: 330 ug/dL (ref 250–450)
UIBC: 317 ug/dL

## 2022-07-02 LAB — MAGNESIUM: Magnesium: 2.2 mg/dL (ref 1.7–2.4)

## 2022-07-02 LAB — VITAMIN B12: Vitamin B-12: 112 pg/mL — ABNORMAL LOW (ref 180–914)

## 2022-07-02 LAB — FERRITIN: Ferritin: 148 ng/mL (ref 24–336)

## 2022-07-02 MED ORDER — POLYSACCHARIDE IRON COMPLEX 150 MG PO CAPS
150.0000 mg | ORAL_CAPSULE | Freq: Every day | ORAL | Status: DC
  Administered 2022-07-02 – 2022-07-10 (×9): 150 mg via ORAL
  Filled 2022-07-02 (×9): qty 1

## 2022-07-02 MED ORDER — SODIUM CHLORIDE 0.9 % IV SOLN: INTRAVENOUS | Status: DC

## 2022-07-02 MED ORDER — CYANOCOBALAMIN 1000 MCG/ML IJ SOLN
1000.0000 ug | Freq: Once | INTRAMUSCULAR | Status: AC
  Administered 2022-07-02: 1000 ug via INTRAMUSCULAR
  Filled 2022-07-02: qty 1

## 2022-07-02 MED ORDER — VITAMIN B-12 1000 MCG PO TABS
1000.0000 ug | ORAL_TABLET | Freq: Every day | ORAL | Status: DC
  Administered 2022-07-03 – 2022-07-10 (×8): 1000 ug via ORAL
  Filled 2022-07-02 (×8): qty 1

## 2022-07-02 MED ORDER — OXYCODONE HCL 5 MG PO TABS
5.0000 mg | ORAL_TABLET | Freq: Four times a day (QID) | ORAL | Status: DC | PRN
  Administered 2022-07-02 – 2022-07-10 (×26): 5 mg via ORAL
  Filled 2022-07-02 (×28): qty 1

## 2022-07-02 MED ORDER — MORPHINE SULFATE (PF) 2 MG/ML IV SOLN: 2.0000 mg | INTRAVENOUS | Status: DC | PRN

## 2022-07-02 MED ORDER — SODIUM CHLORIDE 0.9 % IV SOLN: INTRAVENOUS | Status: AC

## 2022-07-02 NOTE — Evaluation (Signed)
Physical Therapy Evaluation Patient Details Name: Thomas Williamson MRN: [id number] DOB: Sep 24, 1975 Today's Date: 07/02/2022  History of Present Illness  47 y.o. male presenting after a fall when tripping over his dog. He was found to have left acetabular fracture and hip location, is now s/p ORIF L acetabulum fracture dislocation with foot drop 06/30/22. PMH significant of ETOH use d/o, bipolar d/o, chronic systolic CHF, and HTN.   Clinical Impression  Pt in bed upon arrival of PT, agreeable to evaluation at this time. Prior to admission the pt was independent with all mobility, living with his parents. The pt now presents with limitations in functional mobility, coordination, strength, and stability due to above dx, and will continue to benefit from skilled PT to address these deficits. His physical deficits are further impacted by deficits in safety awareness, cognition, and memory that limit his ability to safely follow commands or mobilize without significant cueing and assist. The pt was limited to stand-pivot transfer due to inability to move his RLE while maintaining TDWB on LLE. Will continue to benefit from intensive therapies to progress back to independence with mobility, the pt's mother was present and expressed she is available to supervise and assist some but not able to provide level of assist pt currently needs.        Recommendations for follow up therapy are one component of a multi-disciplinary discharge planning process, led by the attending physician.  Recommendations may be updated based on patient status, additional functional criteria and insurance authorization.  Follow Up Recommendations       Assistance Recommended at Discharge Frequent or constant Supervision/Assistance  Patient can return home with the following  Two people to help with walking and/or transfers;Two people to help with bathing/dressing/bathroom;Direct supervision/assist for medications management;Direct  supervision/assist for financial management;Assist for transportation;Help with stairs or ramp for entrance    Equipment Recommendations  (defer to post acute)  Recommendations for Other Services  Rehab consult    Functional Status Assessment Patient has had a recent decline in their functional status and demonstrates the ability to make significant improvements in function in a reasonable and predictable amount of time.     Precautions / Restrictions Precautions Precautions: Posterior Hip Precaution Booklet Issued: Yes (comment) Precaution Comments: pt with no recall of precautions Restrictions Weight Bearing Restrictions: Yes LLE Weight Bearing: Touchdown weight bearing      Mobility  Bed Mobility Overal bed mobility: Needs Assistance Bed Mobility: Sit to Supine       Sit to supine: Min assist   General bed mobility comments: minA to LLE    Transfers Overall transfer level: Needs assistance Equipment used: Rolling walker (2 wheels) Transfers: Sit to/from Stand, Bed to chair/wheelchair/BSC Sit to Stand: Mod assist, Max assist Stand pivot transfers: Max assist         General transfer comment: mod-maxA to stand from recliner while maintaining TDWB, maxA to cue for pivot. poor balance with TDWB    Ambulation/Gait               General Gait Details: pt unable     Balance Overall balance assessment: Needs assistance Sitting-balance support: Feet supported Sitting balance-Leahy Scale: Fair     Standing balance support: Bilateral upper extremity supported Standing balance-Leahy Scale: Poor                               Pertinent Vitals/Pain Pain Assessment Pain Assessment: Faces Pain Score: 8  Faces Pain Scale: Hurts even more Pain Location: LLE and hip Pain Descriptors / Indicators: Aching, Jabbing, Pounding Pain Intervention(s): Limited activity within patient's tolerance, Monitored during session, Repositioned, Patient requesting  pain meds-RN notified    Home Living Family/patient expects to be discharged to:: Private residence Living Arrangements: Parent Available Help at Discharge: Family;Available 24 hours/day Type of Home: House Home Access: Stairs to enter Entrance Stairs-Rails: Can reach both;Right;Left Entrance Stairs-Number of Steps: 3 from front entrance, half step from garage Alternate Level Stairs-Number of Steps: 10 steps Home Layout: Two level;Able to live on main level with bedroom/bathroom Home Equipment: Shower seat - built in      Prior Function Prior Level of Function : Working/employed;Driving;Independent/Modified Independent             Mobility Comments: independent ADLs Comments: independent     Hand Dominance   Dominant Hand: Right    Extremity/Trunk Assessment   Upper Extremity Assessment Upper Extremity Assessment: Defer to OT evaluation    Lower Extremity Assessment Lower Extremity Assessment: LLE deficits/detail LLE Deficits / Details: decreased sensation along top of foot, limited active DF in gravity minimized position LLE Sensation: decreased light touch LLE Coordination: decreased fine motor;decreased gross motor    Cervical / Trunk Assessment Cervical / Trunk Assessment: Normal  Communication   Communication: No difficulties  Cognition Arousal/Alertness: Awake/alert Behavior During Therapy: Impulsive Overall Cognitive Status: Impaired/Different from baseline Area of Impairment: Memory, Following commands, Safety/judgement, Awareness, Problem solving, Orientation, Attention                 Orientation Level: Disoriented to, Place, Time Current Attention Level: Focused Memory: Decreased recall of precautions, Decreased short-term memory Following Commands: Follows one step commands inconsistently, Follows one step commands with increased time Safety/Judgement: Decreased awareness of safety, Decreased awareness of deficits Awareness:  Intellectual Problem Solving: Slow processing, Difficulty sequencing, Requires verbal cues, Requires tactile cues General Comments: pt with limited recall of precautions and was unable to recall even after told multiple times in session. poor insight to deficits, no awareness of deficits, tangential but redirectable        General Comments General comments (skin integrity, edema, etc.): VSS on RA    Exercises     Assessment/Plan    PT Assessment Patient needs continued PT services  PT Problem List Decreased strength;Decreased range of motion;Decreased activity tolerance;Decreased balance;Decreased mobility;Decreased coordination;Decreased cognition;Decreased knowledge of precautions;Decreased safety awareness       PT Treatment Interventions DME instruction;Gait training;Stair training;Functional mobility training;Therapeutic activities;Therapeutic exercise;Balance training;Patient/family education    PT Goals (Current goals can be found in the Care Plan section)  Acute Rehab PT Goals Patient Stated Goal: return home to golf with his dad PT Goal Formulation: With patient Time For Goal Achievement: 07/16/22 Potential to Achieve Goals: Good    Frequency Min 4X/week        AM-PAC PT "6 Clicks" Mobility  Outcome Measure Help needed turning from your back to your side while in a flat bed without using bedrails?: A Little Help needed moving from lying on your back to sitting on the side of a flat bed without using bedrails?: A Little Help needed moving to and from a bed to a chair (including a wheelchair)?: Total Help needed standing up from a chair using your arms (e.g., wheelchair or bedside chair)?: Total Help needed to walk in hospital room?: Total Help needed climbing 3-5 steps with a railing? : Total 6 Click Score: 10    End of Session Equipment  Utilized During Treatment: Gait belt Activity Tolerance: Patient tolerated treatment well;Patient limited by fatigue Patient  left: in bed;with call bell/phone within reach;with bed alarm set Nurse Communication: Mobility status;Weight bearing status PT Visit Diagnosis: Unsteadiness on feet (R26.81);Pain;Muscle weakness (generalized) (M62.81) Pain - Right/Left: Left Pain - part of body: Leg    Time: 1610-9604 PT Time Calculation (min) (ACUTE ONLY): 47 min   Charges:   PT Evaluation $PT Eval Moderate Complexity: 1 Mod PT Treatments $Therapeutic Exercise: 8-22 mins $Therapeutic Activity: 8-22 mins        Vickki Muff, PT, DPT   Acute Rehabilitation Department Office [phone] Secure Chat Communication Preferred  Ronnie Derby 07/02/2022, 4:20 PM

## 2022-07-02 NOTE — Progress Notes (Signed)
OT Cancellation Note  Patient Details Name: Thomas Williamson MRN: [id number] DOB: 05/19/1975   Cancelled Treatment:    Reason Eval/Treat Not Completed: Patient at procedure or test/ unavailable (Patient being transported down to MRI upon OT entry. Spoke with patient's mother about cognition concerns. OT may plan to assess cog with SBT or Mini Cog next OT session. OT to continue to f/u with patient as able.)  07/02/2022  AB, OTR/L  Acute Rehabilitation Services  Office: [phone]   [name] 07/02/2022, 5:10 PM

## 2022-07-02 NOTE — Hospital Course (Addendum)
The patient is a 47 year old Caucasian male with past medical history significant for but limited to alcohol use disorder, bipolar disorder, chronic systolic CHF, hypertension as well as other comorbidities who presented with a fall.  Reports that he was coming down the stairs with his dog and the dog stopped and the patient pivoted and he tripped over the dog and fell down the rest of the stairs.  He says that he had pain in the hip and overall send notes that he "drank a double" last night prior to fall and feels that it did not contribute to the fall.  He recently returned from rehab for his alcoholism and only drinks a few drinks daily now.  He was at the [facility] ED and transferred to [facility] and was found to have a left acetabular fracture and hip dislocation.  Orthopedic surgery evaluated and took the patient for surgical intervention on 06/30/2022.   **Interim History  Patient is Postoperative day 6. Pain regimen has been initiated but may have been too much so adjusted.  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  PT OT to further evaluate and treat and they are recommending CIR and he may be a candidate  Patient appeared more confused postoperatively and could be in the setting of narcotics given that he was getting 15 mg of oxycodone IR every 4 hours as needed but appears to be that he is taking it regularly.  Have adjusted medications and obtained a head CT scan and placed him on delirium precautions.  CT head was normal.  Patient was much more awake and alert yesterday a.m. and this AM however he continues to withdraw a little bit. He told me that he had relapsed and had a drink the day before coming in and became agitated yesterday afternoon.  Lorazepam protocol been reinitiated on 07/03/22.  Patient  has removed wound dressing and still not really compliant with his weightbearing restrictions but seems more appropriate today.   Assessment and Plan:  Hip dislocation/fracture s/p ORIF Aceteabulum Fracture Dislocation with Foot Drop POD 6 -Apparently mechanical fall resulting in hip fracture -Orthopedics consulted - this is a complicated fracture and Dr. Carola Frost is planning to perform the surgery so he was transferred from [facility] to [name] --SCDs overnight and now on Enoxaparin 40 mg sq q24h while inpatient and then to be discharged on Eliquis 2.5 mg p.o. twice daily for 30 days -Vitamin D deficiency with low vitamin D of 10.13 and so orthopedic surgery was checking a testosterone panel in the setting of his chronic alcoholism and marijuana use; initiated on cholecalciferol 2000 units p.o. daily will have orthopedic surgery adjust further -Continue current Pain Regimen for now -[facility] team consult for rehab placement -Will need PT/OT consult post-operatively and this is is done and they are recommending CIR -Hip fracture order set utilized -TXA per orthopedics -Fascia iliacus block ordered per anesthesia -Further Management per Orthopedic Surgery and they are recommending TDWB Left Leg 8 weeks with Posterior Hip Precautions x 12 weeks  -Orthopedic surgery recommending daily dressing changes starting on 07/03/2022 for the left hip -Orthopedic surgery also recommending 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 twice daily, MiraLAX 17 g daily as needed for mild constipation and if necessary will change to Senna-Docusate 1 tab p.o. twice daily; patient also has a Bisacodyl 5 mg p.o. daily as needed for Moderate  constipation    Pre-operative Stratification -Orthopedic/spinal surgery is associated with an intermediate (1-5%) cardiovascular risk for cardiac death and nonfatal MI -With his h/o CHF, his revised cardiac index gives a risk estimate of 6.0% -Because of this risk, he is recommended to have pre-operative EKG testing prior to surgery; this was done in the ER -His Detsky's Modified Cardiac Risk Index score is Class I, with a low cardiac risk -It is reasonable for him to go to the OR without additional evaluation   ETOH Dependence with concern for withdrawal -Patient with chronic ETOH dependence -He was recently discharged from rehab and had started drinking again intermittently and stated that he drank the day before prior to falling -CIWA protocol reinitiated and he is doing better with CIWA Scores ranging from 1-5 Today; Got as High as 15 yesterday -Folate, thiamine, and MVI ordered -Will provide symptom-triggered BZD (ativan per CIWA protocol) only since the patient is able to communicate; is very confused today and delirious likely in the setting of narcotics; and has no history of severe withdrawal. -TOC team consult for substance abuse counseling -Will also check UDS but not done yet -Consider offering a medication for Alcohol Use Disorder at the time of d/c, to include Disulfuram; Naltrexone; or Acamprosate.  He has been on naltrexone but appears to take it inconsistently.  Confusion and Delirium, improving, concern for some withdrawal aspect -Suspect this is a post anesthetic effect versus narcotics with poor clearance due to elevated creatinine -Placed on delirium precautions -Minimize narcotics and adjustments as necessary as above -Obtain a Head CT scan without contrast and this showed no acute intracranial abnormalities -Check TSH which was 0.375, RPR which was nonreactive, vitamin B12 level of 2098, and ammonia level was 18 -He became extremely agitated afternoon with concern for  his withdrawal to his lorazepam CIWA protocol has been restarted; may need some Librium   Chronic Systolic CHF -EF in 2020 was 30-35%, 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 -Continue Buproprion 300 mg po Daily, Buspirone 5 mg po BID, and Fluoxetine 20 mg po Daily   HLD -Hold Pravastatin 40 mg po Daily given significantly abnormal LFTs  Renal Insuffiencey, improved  -BUN/Cr Trend: Recent Labs  Lab 06/29/22 2342 06/30/22 0913 07/01/22 0356 07/02/22 0152 07/03/22 0323 07/04/22 0111 07/05/22 0143  BUN 15 13 14  25* 15 14 16   CREATININE 1.09 0.99 1.13 1.35* 0.94 0.95 0.93  -Hold HCTZ and Losartan and IV fluid hydration had been started but now stopped -  Avoid Nephrotoxic Medications, Contrast Dyes, Hypotension and Dehydration to Ensure Adequate Renal Perfusion and will need to Renally Adjust Meds -Continue to Monitor and Trend Renal Function carefully and repeat CMP in the AM   Abnormal LFTs -Unclear Etiology -LFT Trend: Recent Labs  Lab 06/30/22 0913 07/02/22 0152 07/03/22 0323 07/04/22 0111 07/05/22 0143 07/06/22 0948  AST 24 29 25 24 18  630*  ALT 27 18 14 15 15  302*  -RUQ U/S done and showed "No significant sonographic abnormality of the liver or gallbladder.  Hypoechogenicity seen superior to the right hemidiaphragm is  suspicious for pleural effusion." -Avoid Hepatotoxic Medications if possible  -Check Acute Hepatitis Panel in the AM -Consult GI for further Evaluation and Recc's   Hyponatremia -Mild. Na+ Trend: Recent Labs  Lab 06/29/22 2342 06/30/22 0913 07/01/22 0356 07/02/22 0152 07/03/22 0323 07/04/22 0111 07/05/22 0143  NA 134* 132* 133* 135 134* 135 135  -Discontinue po HCTZ while hospitalized  -Continue to Monitor and Trend and repeat CMP in the AM   Leukocytosis, improved -WBC Trend: Recent Labs  Lab 06/29/22 2342 06/30/22 0913 07/01/22 0356 07/02/22 0152 07/03/22 0323 07/04/22 0111 07/05/22 0143  WBC 11.4* 13.3* 12.9* 14.2* 8.6 7.5 7.7  -Likely Reactive in the setting of above -Continue to Monitor for S/Sx of Infection and repeat CBC in the AM   Normocytic Anemia -Hgb/Hct Trend: Recent Labs  Lab 06/29/22 2342 06/30/22 0913 07/01/22 0356 07/02/22 0152 07/03/22 0323 07/04/22 0111 07/05/22 0143  HGB 12.5* 11.7* 10.3* 9.2* 8.0* 8.1* 7.8*  HCT 36.5* 33.4* 30.8* 27.6* 24.1* 24.3* 23.7*  MCV 93.1 92.5 96.9 96.8 96.8 96.8 96.0  -Checked Anemia Panel and showed an iron level of 13, UIBC of 317, TIBC of 330, saturation ratios of 4%, ferritin level 140, folate level 17.0 and vitamin B12 level 112 -Will start vitamin B supplementation with cyanocobalamin (IM injection intially and po starting 07/03/22) and iron supplementation with Niferex 150 mg po Daily -Continue to Monitor for S/Sx of Bleeding; No overt bleeding noted -Repeat CBC in the AM   GERD/GI Prophylaxis -C/w Pantoprazole 40 mg po Daily  Hypoalbuminemia -Patient's Albumin Trend: Recent Labs  Lab 06/30/22 0913 07/02/22 0152 07/03/22 0323 07/04/22 0111 07/05/22 0143  ALBUMIN 4.1 3.9 3.3* 3.2* 3.0*  -Continue to Monitor and Trend and repeat CMP in the AM

## 2022-07-02 NOTE — Progress Notes (Addendum)
Orthopaedic Trauma Service Progress Note  Patient ID: Thomas Williamson MRN: [id number] DOB/AGE: 10-11-1975 47 y.o.  Subjective:  Ortho issues stable Sitting in chair Confused.  Talking about an echo mix up.  Pt had echo 05/2022  Unable to recall TDWB restrictions and posterior hip precautions   XRT completed yesterday. Pt tolerated well  ROS As above  Objective:   VITALS:   Vitals:   07/01/22 2300 07/02/22 0500 07/02/22 0600 07/02/22 0808  BP: 121/76 108/73 106/71 104/72  Pulse: 95 (!) 102 98   Resp: 15  17 12   Temp: 98.8 F (37.1 C) 98.4 F (36.9 C)    TempSrc: Oral Oral    SpO2: 95% 94% 94%   Weight:      Height:        Estimated body mass index is 25.62 kg/m as calculated from the following:   Height as of this encounter: 5\' 9"  (1.753 m).   Weight as of this encounter: 78.7 kg.   Intake/Output      05/29 0701 05/30 0700 05/30 0701 05/31 0700   P.O. 300    I.V. (mL/kg)     IV Piggyback     Total Intake(mL/kg) 300 (3.8)    Urine (mL/kg/hr) 300 (0.2)    Blood     Total Output 300    Net 0           LABS  Results for orders placed or performed during the hospital encounter of 06/30/22 (from the past 24 hour(s))  Glucose, capillary     Status: Abnormal   Collection Time: 07/01/22  8:08 PM  Result Value Ref Range   Glucose-Capillary 109 (H) 70 - 99 mg/dL  CBC with Differential/Platelet     Status: Abnormal   Collection Time: 07/02/22  1:52 AM  Result Value Ref Range   WBC 14.2 (H) 4.0 - 10.5 K/uL   RBC 2.85 (L) 4.22 - 5.81 MIL/uL   Hemoglobin 9.2 (L) 13.0 - 17.0 g/dL   HCT 19.1 (L) 47.8 - 29.5 %   MCV 96.8 80.0 - 100.0 fL   MCH 32.3 26.0 - 34.0 pg   MCHC 33.3 30.0 - 36.0 g/dL   RDW 62.1 30.8 - 65.7 %   Platelets 333 150 - 400 K/uL   nRBC 0.0 0.0 - 0.2 %   Neutrophils Relative % 82 %   Neutro Abs 11.5 (H) 1.7 - 7.7 K/uL   Lymphocytes Relative 9 %   Lymphs Abs 1.3 0.7 - 4.0  K/uL   Monocytes Relative 9 %   Monocytes Absolute 1.3 (H) 0.1 - 1.0 K/uL   Eosinophils Relative 0 %   Eosinophils Absolute 0.0 0.0 - 0.5 K/uL   Basophils Relative 0 %   Basophils Absolute 0.0 0.0 - 0.1 K/uL   Immature Granulocytes 0 %   Abs Immature Granulocytes 0.06 0.00 - 0.07 K/uL  Comprehensive metabolic panel     Status: Abnormal   Collection Time: 07/02/22  1:52 AM  Result Value Ref Range   Sodium 135 135 - 145 mmol/L   Potassium 3.9 3.5 - 5.1 mmol/L   Chloride 98 98 - 111 mmol/L   CO2 23 22 - 32 mmol/L   Glucose, Bld 100 (H) 70 - 99 mg/dL   BUN 25 (H) 6 - 20 mg/dL   Creatinine,  Ser 1.35 (H) 0.61 - 1.24 mg/dL   Calcium 9.5 8.9 - 16.1 mg/dL   Total Protein 7.1 6.5 - 8.1 g/dL   Albumin 3.9 3.5 - 5.0 g/dL   AST 29 15 - 41 U/L   ALT 18 0 - 44 U/L   Alkaline Phosphatase 53 38 - 126 U/L   Total Bilirubin 0.5 0.3 - 1.2 mg/dL   GFR, Estimated >09 >60 mL/min   Anion gap 14 5 - 15  Phosphorus     Status: None   Collection Time: 07/02/22  1:52 AM  Result Value Ref Range   Phosphorus 4.3 2.5 - 4.6 mg/dL  Magnesium     Status: None   Collection Time: 07/02/22  1:52 AM  Result Value Ref Range   Magnesium 2.2 1.7 - 2.4 mg/dL  Vitamin A54     Status: Abnormal   Collection Time: 07/02/22  1:52 AM  Result Value Ref Range   Vitamin B-12 112 (L) 180 - 914 pg/mL  Folate     Status: None   Collection Time: 07/02/22  1:52 AM  Result Value Ref Range   Folate 17.0 >5.9 ng/mL  Iron and TIBC     Status: Abnormal   Collection Time: 07/02/22  1:52 AM  Result Value Ref Range   Iron 13 (L) 45 - 182 ug/dL   TIBC 098 119 - 147 ug/dL   Saturation Ratios 4 (L) 17.9 - 39.5 %   UIBC 317 ug/dL  Ferritin     Status: None   Collection Time: 07/02/22  1:52 AM  Result Value Ref Range   Ferritin 148 24 - 336 ng/mL  Reticulocytes     Status: Abnormal   Collection Time: 07/02/22  1:52 AM  Result Value Ref Range   Retic Ct Pct 2.0 0.4 - 3.1 %   RBC. 2.80 (L) 4.22 - 5.81 MIL/uL   Retic Count,  Absolute 54.9 19.0 - 186.0 K/uL   Immature Retic Fract 10.9 2.3 - 15.9 %     PHYSICAL EXAM:   Gen: sitting in chair. Some tremors noted. Has not touched his breakfast  Lungs: unlabored  Cardiac: reg Abd: NT, + BS  Ext:       Left Lower Extremity              Dressing L hip is not present (pt states he may have picked it off)   Incision clean and not draining   New mepilex applied              PRAFO boot is not on either   I place PRAFO back on              Ext warm              + DP pulse             No DCT             Compartments are soft             SPN, TN sensation intact             Diminished DPN sensation             no EHL or ankle extension noted             Ankle and toe flexion intact               Assessment/Plan: 2 Days Post-Op   Principal Problem:   Acetabulum  fracture, left (HCC) Active Problems:   Hypertension   Chronic anxiety   Dyslipidemia   Cardiomyopathy, alcoholic (HCC)   Left bundle branch block (LBBB)   Alcohol dependence (HCC)   Left foot drop   Vitamin D deficiency   Anti-infectives (From admission, onward)    Start     Dose/Rate Route Frequency Ordered Stop   06/30/22 2100  ceFAZolin (ANCEF) IVPB 2g/100 mL premix        2 g 200 mL/hr over 30 Minutes Intravenous Every 8 hours 06/30/22 1642 07/01/22 1547   06/30/22 1100  ceFAZolin (ANCEF) IVPB 2g/100 mL premix        2 g 200 mL/hr over 30 Minutes Intravenous On call to O.R. 06/30/22 1005 06/30/22 1300   06/30/22 1008  ceFAZolin (ANCEF) 2-4 GM/100ML-% IVPB       Note to Pharmacy: Jamelle Rushing, GRETA: cabinet override      06/30/22 1008 06/30/22 1315     .  POD/HD#: 24  47 year old male s/p with left acetabulum fracture dislocation   -Closed left transverse posterior wall acetabular fracture dislocation with foot drop s/p ORIF L acetabulum Weightbearing Touchdown weightbearing left leg using crutches or walker.  Will advance weightbearing in about 8 weeks                ROM/Activity                         Posterior hip precautions for 12 weeks.  Activity as tolerated while maintaining weightbearing restrictions and range of motion precautions.               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  We discussed potential long recovery process for this.   - Pain management:             Multimodal             Encourage oral medications over IV  - ABL anemia/Hemodynamics             Stable, monitor  - Medical issues              Per primary  - DVT/PE prophylaxis:             Lovenox and SCDs while inpatient             Discharged on Eliquis 2.5 mg p.o. 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  Labs show vitamin D deficiency.  Supplement.  Will also check a testosterone panel in the setting of chronic alcoholism and marijuana use   - Activity:             As above   - Impediments to fracture healing:             Chronic alcohol use  Nicotine dependence             Regular marijuana use             Vitamin D deficiency   - Dispo:             Ortho issues addressed.  Therapy evaluations  Elevated risk for complications including DTs                   Mearl Latin, PA-C [phone] (C) 07/02/2022, 10:31 AM  [facility] [address] [place] [address] [phone] [phone] (F)    After 5pm and on the weekends please log on to Amion, go to orthopaedics and the look under the [facility] Call for the provider(s) on call. You can also call our office at [phone] and then follow the prompts to be connected to the  call team.  Patient ID: Thomas Williamson, male   DOB: Jun 14, 1975, 47 y.o.   MRN: [id number]

## 2022-07-02 NOTE — Progress Notes (Signed)
PROGRESS NOTE    Thomas Williamson  WUJ:[id number] DOB: 1976-01-09 DOA: 06/30/2022 PCP: Nelwyn Salisbury, MD   Brief Narrative:  The patient is a 47 year old Caucasian male with past medical history significant for but limited to alcohol use disorder, bipolar disorder, chronic systolic CHF, hypertension as well as other comorbidities who presented with a fall.  Reports that he was coming down the stairs with his dog and the dog stopped and the patient pivoted and he tripped over the dog and fell down the rest of the stairs.  He says that he had pain in the hip and overall send notes that he "drank a double" last night prior to fall and feels that it did not contribute to the fall.  He recently returned from rehab for his alcoholism and only drinks a few drinks daily now.  He was at the [facility] ED and transferred to [facility] and was found to have a left acetabular fracture and hip dislocation.  Orthopedic surgery evaluated and took the patient for surgical intervention on 06/30/2022.   **Interim History  Postoperative day 2 and pain regimen has been initiated but may have been too much so adjusted.  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  PT OT to further evaluate and treat and they are recommending CIR.  Patient appears more confused today than yesterday and could be in the setting of narcotics given that he was getting 15 mg of oxycodone IR every 4 hours as needed but appears to be that he is taking it regularly.  Will adjust medications and obtain a head CT scan and placed him on delirium precautions.   Assessment and Plan:    Hip dislocation/fracture s/p ORIF Aceteabulum Fracture Dislocation with Foot Drop POD 2 -Apparently mechanical fall resulting in hip fracture -Orthopedics consulted - this is a  complicated fracture and Dr. Carola Frost is planning to perform the surgery so he was transferred from [facility] to [facility] -Was NPO after midnight in anticipation of surgical repair and now on a Diet -SCDs overnight and now on Enoxaparin 40 mg sq q24h while inpatient and then to be discharged on Eliquis 2.5 mg p.o. twice daily for 30 days -Vitamin D deficiency with low vitamin D of 10.13 and so orthopedic surgery was checking a testosterone panel in the setting of his chronic alcoholism and marijuana use; initiated on cholecalciferol 2000 units p.o. daily will have orthopedic surgery adjust further -Pain control with acetaminophen 650 g p.o. every 6hprn, Methacarbamol 1000 mg IV q6hprn Muscle Spasms, Tramadol 50 mg po q8hprn Moderate Paine, Oxycodone 5-10 mg po q4hprn Breakthrough Pain, and IV Morphine 2 mg q2hprn Severe Pain was initiated but orthopedic surgery adjusted this and he is now getting Oxycodone IR 5-15 mg every 4 hours as needed and was getting IV morphine 2 mg every 2 as needed.  Adjustments have been made given his confusion and we have now placed him on oxycodone IR 5 mg every 6 hours as needed and IV morphine 2 mg every 4 hours as needed -TOC team consult for rehab placement -Will need PT/OT consult post-operatively and this is is done and they are recommending CIR -Hip fracture order set utilized -TXA per orthopedics -Fascia iliacus block ordered per anesthesia -Further Management per Orthopedic Surgery and they are recommending TDWB Left Leg 8 weeks with Posterior Hip Precautions  x 12 weeks  -Orthopedic surgery recommending daily dressing changes starting on 07/03/2022 for the left hip -Orthopedic surgery also recommending 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 twice daily, MiraLAX 17 g daily as needed for mild constipation and if necessary will change to senna docusate 1 tab p.o. twice daily; patient also has a bisacodyl 5 mg p.o. daily as needed for Moderate constipation    Pre-operative stratification -Orthopedic/spinal surgery is associated with an intermediate (1-5%) cardiovascular risk for cardiac death and nonfatal MI -With his h/o CHF, his revised cardiac index gives a risk estimate of 6.0% -Because of this risk, he is recommended to have pre-operative EKG testing prior to surgery; this was done in the ER -His Detsky's Modified Cardiac Risk Index score is Class I, with a low cardiac risk -It is reasonable for him to go to the OR without additional evaluation   ETOH Dependence -Patient with chronic ETOH dependence -He was recently discharged from rehab and has started drinking again -CIWA protocol -Folate, thiamine, and MVI ordered -Will provide symptom-triggered BZD (ativan per CIWA protocol) only since the patient is able to communicate; is very confused today and delirious likely in the setting of narcotics; and has no history of severe withdrawal. -TOC team consult for substance abuse counseling -Will also check UDS but not done yet -Consider offering a medication for Alcohol Use Disorder at the time of d/c, to include Disulfuram; Naltrexone; or Acamprosate.  He has been on naltrexone but appears to take it inconsistently.  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 Na+  Trend: Recent Labs  Lab 06/18/22 1154 06/29/22 2342 06/30/22 0913 07/01/22 0356 07/02/22 0152  NA 136 134* 132* 133* 135  -Discontinue po HCTZ while hospitalized  -Continue to Monitor and Trend and repeat CMP in the AM   Leukocytosis -WBC Trend: Recent Labs  Lab 06/29/22 2342 06/30/22 0913 07/01/22 0356 07/02/22 0152  WBC 11.4* 13.3* 12.9* 14.2*  -Likely Reactive in the setting of above -Continue to Monitor for S/Sx of Infection and repeat CBC in the AM   Normocytic Anemia -Hgb/Hct Trend: Recent Labs  Lab 06/29/22 2342 06/30/22 0913 07/01/22 0356 07/02/22 0152  HGB 12.5* 11.7* 10.3* 9.2*  HCT 36.5* 33.4* 30.8* 27.6*  MCV 93.1 92.5 96.9 96.8   -Checked Anemia Panel and showed an iron level of 13, UIBC of 317, TIBC of 330, saturation ratios of 4%, ferritin level 140, folate level 17.0 and vitamin B12 level 112 -Will start vitamin B supplementation with cyanocobalamin (IM injection today and po starting 07/03/22) and iron supplementation with Niferex 150 mg po Daily -Continue to Monitor for S/Sx of Bleeding; No overt bleeding noted -Repeat CBC in the AM   GERD/GI Prophylaxis -C/w Pantoprazole 40 mg po Daily   DVT prophylaxis: enoxaparin (LOVENOX) injection 40 mg Start: 07/01/22 0800 SCDs Start: 06/30/22 1643 SCDs Start: 06/30/22 0957    Code Status: Full Code Family Communication: No family currently at bedside  Disposition Plan:  Level of care: Telemetry Medical Status is: Inpatient Remains inpatient appropriate because: Patient remains confused today and will need likely to go to CIR once medically stable   Consultants:  Orthopedic surgery Radiation oncology  Procedures:  Closed left transverse posterior wall acetabular fracture dislocation with foot drop status post ORIF of the left acetabulum done by Dr. Carola Frost   Antimicrobials:  Anti-infectives (From admission, onward)    Start     Dose/Rate Route Frequency Ordered Stop   06/30/22 2100  ceFAZolin (ANCEF) IVPB 2g/100 mL premix        2 g 200 mL/hr over 30 Minutes Intravenous Every 8 hours 06/30/22 1642 07/01/22 1547   06/30/22 1100  ceFAZolin (ANCEF) IVPB 2g/100 mL premix        2 g 200 mL/hr over 30 Minutes Intravenous On call to O.R. 06/30/22 1005 06/30/22 1300   06/30/22 1008  ceFAZolin (ANCEF) 2-4 GM/100ML-% IVPB       Note to Pharmacy: Lurena Nida: cabinet override      06/30/22 1008 06/30/22 1315       Subjective: Seen and examined at bedside he was sitting in the chair and remains significantly confused worse than yesterday.  He went for radiation and patient was talking about Thanksgiving dinner.  Denies any pain but upon further chart review he is  getting 15 mg of oxycodone IR almost every 4 hours for the last day or so which we have now discontinued.  He denies any complaints at this time.  Objective: Vitals:   07/02/22 0600 07/02/22 0808 07/02/22 1315 07/02/22 1456  BP: 106/71 104/72 (!) 109/93 118/77  Pulse: 98  100 95  Resp: 17 12 18 16   Temp:  98.3 F (36.8 C) 98.1 F (36.7 C)   TempSrc:      SpO2: 94%  95%   Weight:      Height:        Intake/Output Summary (Last 24 hours) at 07/02/2022 1655 Last data filed at 07/02/2022 1300 Gross per 24 hour  Intake 540 ml  Output --  Net 540 ml  Filed Weights   06/30/22 1019 07/01/22 1000  Weight: 77.1 kg 78.7 kg   Examination: Physical Exam:  Constitutional: WN/WD overweight Caucasian male in no acute distress Respiratory: Diminished to auscultation bilaterally, no wheezing, rales, rhonchi or crackles. Normal respiratory effort and patient is not tachypenic. No accessory muscle use.  Unlabored breathing Cardiovascular: RRR, no murmurs / rubs / gallops. S1 and S2 auscultated. No extremity edema.  Abdomen: Soft, non-tender, mildly distended secondary to body habitus. bowel sounds positive.  GU: Deferred. Musculoskeletal: No clubbing / cyanosis of digits/nails. No joint deformity upper and lower extremities.  Skin: No rashes, lesions, ulcers limited skin evaluation. No induration; Warm and dry.  Neurologic: CN 2-12 grossly intact with no focal deficits he appears very confused. Psychiatric: Impaired judgment and insight.  He is awake but not fully alert and oriented given his significant confusion today.  Data Reviewed: I have personally reviewed following labs and imaging studies  CBC: Recent Labs  Lab 06/29/22 2342 06/30/22 0913 07/01/22 0356 07/02/22 0152  WBC 11.4* 13.3* 12.9* 14.2*  NEUTROABS 6.3  --   --  11.5*  HGB 12.5* 11.7* 10.3* 9.2*  HCT 36.5* 33.4* 30.8* 27.6*  MCV 93.1 92.5 96.9 96.8  PLT 481* 381 350 333   Basic Metabolic Panel: Recent Labs  Lab  06/29/22 2342 06/30/22 0913 07/01/22 0356 07/02/22 0152  NA 134* 132* 133* 135  K 3.5 3.6 4.4 3.9  CL 97* 99 99 98  CO2 19* 20* 23 23  GLUCOSE 103* 88 123* 100*  BUN 15 13 14  25*  CREATININE 1.09 0.99 1.13 1.35*  CALCIUM 10.0 9.3 9.4 9.5  MG  --   --   --  2.2  PHOS  --   --   --  4.3   GFR: Estimated Creatinine Clearance: 67.6 mL/min (A) (by C-G formula based on SCr of 1.35 mg/dL (H)). Liver Function Tests: Recent Labs  Lab 06/30/22 0913 07/02/22 0152  AST 24 29  ALT 27 18  ALKPHOS 73 53  BILITOT 1.1 0.5  PROT 7.0 7.1  ALBUMIN 4.1 3.9   No results for input(s): "LIPASE", "AMYLASE" in the last 168 hours. No results for input(s): "AMMONIA" in the last 168 hours. Coagulation Profile: No results for input(s): "INR", "PROTIME" in the last 168 hours. Cardiac Enzymes: No results for input(s): "CKTOTAL", "CKMB", "CKMBINDEX", "TROPONINI" in the last 168 hours. BNP (last 3 results) No results for input(s): "PROBNP" in the last 8760 hours. HbA1C: No results for input(s): "HGBA1C" in the last 72 hours. CBG: Recent Labs  Lab 07/01/22 2008  GLUCAP 109*   Lipid Profile: No results for input(s): "CHOL", "HDL", "LDLCALC", "TRIG", "CHOLHDL", "LDLDIRECT" in the last 72 hours. Thyroid Function Tests: No results for input(s): "TSH", "T4TOTAL", "FREET4", "T3FREE", "THYROIDAB" in the last 72 hours. Anemia Panel: Recent Labs    07/02/22 0152  VITAMINB12 112*  FOLATE 17.0  FERRITIN 148  TIBC 330  IRON 13*  RETICCTPCT 2.0   Sepsis Labs: No results for input(s): "PROCALCITON", "LATICACIDVEN" in the last 168 hours.  Recent Results (from the past 240 hour(s))  Surgical pcr screen     Status: Abnormal   Collection Time: 06/30/22  9:00 AM   Specimen: Nasal Mucosa; Nasal Swab  Result Value Ref Range Status   MRSA, PCR NEGATIVE NEGATIVE Final   Staphylococcus aureus POSITIVE (A) NEGATIVE Final    Comment: (NOTE) The Xpert SA Assay (FDA approved for NASAL specimens in patients  28 years of age and older), is  one component of a comprehensive surveillance program. It is not intended to diagnose infection nor to guide or monitor treatment. Performed at [facility], [address] [place], [place] [address]     Radiology Studies: No results found.  Scheduled Meds:  amLODipine  10 mg Oral Daily   buPROPion  300 mg Oral Daily   busPIRone  5 mg Oral BID   Chlorhexidine Gluconate Cloth  6 each Topical Q0600   cholecalciferol  2,000 Units Oral Daily   [START ON 07/03/2022] vitamin B-12  1,000 mcg Oral Daily   docusate sodium  100 mg Oral BID   enoxaparin (LOVENOX) injection  40 mg Subcutaneous Q24H   feeding supplement  237 mL Oral BID BM   FLUoxetine  20 mg Oral Daily   folic acid  1 mg Oral Daily   gabapentin  100 mg Oral BID   iron polysaccharides  150 mg Oral Daily   magnesium oxide  400 mg Oral BID   metoprolol succinate  25 mg Oral Daily   multivitamin with minerals  1 tablet Oral Daily   mupirocin ointment  1 Application Nasal BID   pantoprazole  40 mg Oral Daily   potassium chloride  10 mEq Oral Daily   pravastatin  40 mg Oral Daily   thiamine  100 mg Oral Daily   Or   thiamine  100 mg Intravenous Daily   Continuous Infusions:  sodium chloride     methocarbamol (ROBAXIN) IV      LOS: 2 days   Marguerita Merles, DO [facility] Available via Epic secure chat 7am-7pm After these hours, please refer to coverage provider listed on [email or web address] 07/02/2022, 4:55 PM

## 2022-07-03 ENCOUNTER — Encounter (HOSPITAL_COMMUNITY): Payer: Self-pay | Admitting: Orthopedic Surgery

## 2022-07-03 DIAGNOSIS — F419 Anxiety disorder, unspecified: Secondary | ICD-10-CM | POA: Diagnosis not present

## 2022-07-03 DIAGNOSIS — S32402A Unspecified fracture of left acetabulum, initial encounter for closed fracture: Secondary | ICD-10-CM | POA: Diagnosis not present

## 2022-07-03 DIAGNOSIS — F1029 Alcohol dependence with unspecified alcohol-induced disorder: Secondary | ICD-10-CM | POA: Diagnosis not present

## 2022-07-03 DIAGNOSIS — I426 Alcoholic cardiomyopathy: Secondary | ICD-10-CM | POA: Diagnosis not present

## 2022-07-03 LAB — CBC WITH DIFFERENTIAL/PLATELET
Abs Immature Granulocytes: 0.02 10*3/uL (ref 0.00–0.07)
Basophils Absolute: 0 10*3/uL (ref 0.0–0.1)
Basophils Relative: 0 %
Eosinophils Absolute: 0.1 10*3/uL (ref 0.0–0.5)
Eosinophils Relative: 1 %
HCT: 24.1 % — ABNORMAL LOW (ref 39.0–52.0)
Hemoglobin: 8 g/dL — ABNORMAL LOW (ref 13.0–17.0)
Immature Granulocytes: 0 %
Lymphocytes Relative: 19 %
Lymphs Abs: 1.6 10*3/uL (ref 0.7–4.0)
MCH: 32.1 pg (ref 26.0–34.0)
MCHC: 33.2 g/dL (ref 30.0–36.0)
MCV: 96.8 fL (ref 80.0–100.0)
Monocytes Absolute: 0.8 10*3/uL (ref 0.1–1.0)
Monocytes Relative: 10 %
Neutro Abs: 6 10*3/uL (ref 1.7–7.7)
Neutrophils Relative %: 70 %
Platelets: 263 10*3/uL (ref 150–400)
RBC: 2.49 MIL/uL — ABNORMAL LOW (ref 4.22–5.81)
RDW: 12.6 % (ref 11.5–15.5)
WBC: 8.6 10*3/uL (ref 4.0–10.5)
nRBC: 0 % (ref 0.0–0.2)

## 2022-07-03 LAB — RPR: RPR Ser Ql: NONREACTIVE

## 2022-07-03 LAB — VITAMIN B12: Vitamin B-12: 2098 pg/mL — ABNORMAL HIGH (ref 180–914)

## 2022-07-03 LAB — COMPREHENSIVE METABOLIC PANEL
ALT: 14 U/L (ref 0–44)
AST: 25 U/L (ref 15–41)
Albumin: 3.3 g/dL — ABNORMAL LOW (ref 3.5–5.0)
Alkaline Phosphatase: 55 U/L (ref 38–126)
Anion gap: 9 (ref 5–15)
BUN: 15 mg/dL (ref 6–20)
CO2: 26 mmol/L (ref 22–32)
Calcium: 8.9 mg/dL (ref 8.9–10.3)
Chloride: 99 mmol/L (ref 98–111)
Creatinine, Ser: 0.94 mg/dL (ref 0.61–1.24)
GFR, Estimated: >60 mL/min (ref >60)
Glucose, Bld: 98 mg/dL (ref 70–99)
Potassium: 3.4 mmol/L — ABNORMAL LOW (ref 3.5–5.1)
Sodium: 134 mmol/L — ABNORMAL LOW (ref 135–145)
Total Bilirubin: 1 mg/dL (ref 0.3–1.2)
Total Protein: 6.2 g/dL — ABNORMAL LOW (ref 6.5–8.1)

## 2022-07-03 LAB — PHOSPHORUS: Phosphorus: 2.9 mg/dL (ref 2.5–4.6)

## 2022-07-03 LAB — SEX HORMONE BINDING GLOBULIN: Sex Hormone Binding: 48.9 nmol/L (ref 16.5–55.9)

## 2022-07-03 LAB — TSH: TSH: 0.375 u[IU]/mL (ref 0.350–4.500)

## 2022-07-03 LAB — MAGNESIUM: Magnesium: 1.9 mg/dL (ref 1.7–2.4)

## 2022-07-03 LAB — AMMONIA: Ammonia: 18 umol/L (ref 9–35)

## 2022-07-03 MED ORDER — LORAZEPAM 2 MG/ML IJ SOLN: 1.0000 mg | INTRAMUSCULAR | Status: AC | PRN

## 2022-07-03 MED ORDER — LORAZEPAM 1 MG PO TABS
1.0000 mg | ORAL_TABLET | ORAL | Status: AC | PRN
  Administered 2022-07-03 (×4): 3 mg via ORAL
  Administered 2022-07-03: 2 mg via ORAL
  Administered 2022-07-03: 3 mg via ORAL
  Administered 2022-07-04: 4 mg via ORAL
  Administered 2022-07-04: 2 mg via ORAL
  Administered 2022-07-05: 1 mg via ORAL
  Filled 2022-07-03: qty 3
  Filled 2022-07-03: qty 2
  Filled 2022-07-03 (×3): qty 3
  Filled 2022-07-03: qty 1
  Filled 2022-07-03: qty 3
  Filled 2022-07-03: qty 2
  Filled 2022-07-03 (×2): qty 3

## 2022-07-03 NOTE — Progress Notes (Signed)
PROGRESS NOTE    Thomas Williamson  ZOX:[id number] DOB: November 15, 1975 DOA: 06/30/2022 PCP: Nelwyn Salisbury, MD   Brief Narrative:  The patient is a 46 year old Caucasian male with past medical history significant for but limited to alcohol use disorder, bipolar disorder, chronic systolic CHF, hypertension as well as other comorbidities who presented with a fall.  Reports that he was coming down the stairs with his dog and the dog stopped and the patient pivoted and he tripped over the dog and fell down the rest of the stairs.  He says that he had pain in the hip and overall send notes that he "drank a double" last night prior to fall and feels that it did not contribute to the fall.  He recently returned from rehab for his alcoholism and only drinks a few drinks daily now.  He was at the [facility] ED and transferred to [facility] and was found to have a left acetabular fracture and hip dislocation.  Orthopedic surgery evaluated and took the patient for surgical intervention on 06/30/2022.   **Interim History  Patient is Postoperative day 3. Pain regimen has been initiated but may have been too much so adjusted.  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  PT OT to further evaluate and treat and they are recommending CIR.  Patient appeared more confused yesterday than today and could be in the setting of narcotics given that he was getting 15 mg of oxycodone IR every 4 hours as needed but appears to be that he is taking it regularly.  Will adjust medications and obtain a head CT scan and placed him on delirium precautions.  CT head was normal.  Patient was much more awake and alert this a.m. however he continues to withdraw and told me that he had relapsed and had a drink the day before coming in and became agitated this  afternoon.  Lorazepam protocol been reinitiated.  Patient has removed wound dressing and still not really compliant with his weightbearing restrictions.  PT OT recommending CIR   Assessment and Plan:    Hip dislocation/fracture s/p ORIF Aceteabulum Fracture Dislocation with Foot Drop POD 3 -Apparently mechanical fall resulting in hip fracture -Orthopedics consulted - this is a complicated fracture and Dr. Carola Frost is planning to perform the surgery so he was transferred from [facility] to [facility] --SCDs overnight and now on Enoxaparin 40 mg sq q24h while inpatient and then to be discharged on Eliquis 2.5 mg p.o. twice daily for 30 days -Vitamin D deficiency with low vitamin D of 10.13 and so orthopedic surgery was checking a testosterone panel in the setting of his chronic alcoholism and marijuana use; initiated on cholecalciferol 2000 units p.o. daily will have orthopedic surgery adjust further -Pain control with acetaminophen 650 g p.o. every 6hprn, Methacarbamol 1000 mg IV q6hprn Muscle Spasms, Tramadol 50 mg po q8hprn Moderate Paine, Oxycodone 5-10 mg po q4hprn Breakthrough Pain, and IV Morphine 2 mg q2hprn Severe Pain was initiated but orthopedic surgery adjusted this and he is now getting Oxycodone IR 5-15 mg every 4 hours as needed and was getting IV morphine 2 mg every 2 as needed.  Adjustments have been made given his confusion and we have now placed him on oxycodone IR 5 mg every 6 hours as needed and IV morphine 2 mg every 4 hours as needed -  TOC team consult for rehab placement -Will need PT/OT consult post-operatively and this is is done and they are recommending CIR -Hip fracture order set utilized -TXA per orthopedics -Fascia iliacus block ordered per anesthesia -Further Management per Orthopedic Surgery and they are recommending TDWB Left Leg 8 weeks with Posterior Hip Precautions x 12 weeks  -Orthopedic surgery recommending daily dressing changes starting on 07/03/2022 for the left  hip -Orthopedic surgery also recommending 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 twice daily, MiraLAX 17 g daily as needed for mild constipation and if necessary will change to senna docusate 1 tab p.o. twice daily; patient also has a bisacodyl 5 mg p.o. daily as needed for Moderate constipation    Pre-operative stratification -Orthopedic/spinal surgery is associated with an intermediate (1-5%) cardiovascular risk for cardiac death and nonfatal MI -With his h/o CHF, his revised cardiac index gives a risk estimate of 6.0% -Because of this risk, he is recommended to have pre-operative EKG testing prior to surgery; this was done in the ER -His Detsky's Modified Cardiac Risk Index score is Class I, with a low cardiac risk -It is reasonable for him to go to the OR without additional evaluation   ETOH Dependence with concern for withdrawal -Patient with chronic ETOH dependence -He was recently discharged from rehab and has started drinking again and stated that he drank the day before prior to falling -CIWA protocol -Folate, thiamine, and MVI ordered -Will provide symptom-triggered BZD (ativan per CIWA protocol) only since the patient is able to communicate; is very confused today and delirious likely in the setting of narcotics; and has no history of severe withdrawal. -TOC team consult for substance abuse counseling -Will also check UDS but not done yet -Consider offering a medication for Alcohol Use Disorder at the time of d/c, to include Disulfuram; Naltrexone; or Acamprosate.  He has been on naltrexone but appears to take it inconsistently.  Confusion and Delirium, improving, concern for some  withdrawal aspect -Suspect this is a post anesthetic effect versus narcotics with poor clearance due to elevated creatinine -Placed on delirium precautions -Minimize narcotics and adjustments as necessary as above -Obtain a Head CT scan without contrast and this showed no acute intracranial abnormalities -Check TSH which was 0.375, RPR which was nonreactive, vitamin B12 level of 2098, and ammonia level was 18 -He became extremely agitated afternoon with concern for his withdrawal to his lorazepam CIWA protocol has been restarted; may need some Librium   Chronic Systolic CHF -EF in 2020 was 30-35%, 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  Intake/Output Summary (Last 24 hours) at 07/03/2022 1823 Last data filed at 07/03/2022 1556 Gross per 24 hour  Intake 460.67 ml  Output 975 ml  Net -514.33 ml  -Will give gentle IV fluid hydration with NS at 75 mL/hr and this is improved. 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 -Continue Buproprion 300 mg po Daily, Buspirone 5 mg po BID, and Fluoxetine 20 mg po Daily   HLD -Continue Pravastatin 40 mg po Daily   Renal Insuffiencey -BUN/Cr Trend: Recent Labs  Lab 06/18/22 1154 06/29/22 2342 06/30/22 0913 07/01/22 0356 07/02/22 0152 07/03/22 0323  BUN 10 15 13 14  25* 15  CREATININE 0.99 1.09 0.99 1.13 1.35* 0.94  -Hold HCTZ and Losartan and IV fluid hydration had been started -Avoid Nephrotoxic Medications, Contrast Dyes, Hypotension and Dehydration to Ensure Adequate Renal Perfusion and will need to Renally Adjust Meds -Continue to  Monitor and Trend Renal Function carefully and repeat CMP in the AM    Hyponatremia -Mild. Na+ Trend: Recent Labs  Lab 06/18/22 1154 06/29/22 2342 06/30/22 0913 07/01/22 0356 07/02/22 0152 07/03/22 0323  NA 136 134* 132* 133* 135 134*  -Discontinue po HCTZ while hospitalized  -Continue to Monitor and Trend and repeat CMP in the AM   Leukocytosis, improved -WBC Trend: Recent Labs  Lab 06/29/22 2342 06/30/22 0913 07/01/22 0356 07/02/22 0152 07/03/22 0323  WBC 11.4* 13.3* 12.9* 14.2* 8.6  -Likely Reactive in the setting of above -Continue to Monitor for S/Sx of Infection and repeat CBC in the AM   Normocytic Anemia -Hgb/Hct Trend: Recent Labs  Lab 06/29/22 2342 06/30/22 0913 07/01/22 0356 07/02/22 0152 07/03/22 0323  HGB 12.5* 11.7* 10.3* 9.2* 8.0*  HCT 36.5* 33.4* 30.8* 27.6* 24.1*  MCV 93.1 92.5 96.9 96.8 96.8  -Checked Anemia Panel and showed an iron level of 13, UIBC of 317, TIBC of 330, saturation ratios of 4%, ferritin level 140, folate level 17.0 and vitamin B12 level 112 -Will start vitamin B supplementation with cyanocobalamin (IM injection today and po starting 07/03/22) and iron supplementation with Niferex 150 mg po Daily -Continue to Monitor for S/Sx of Bleeding; No overt bleeding noted -Repeat CBC in the AM   GERD/GI Prophylaxis -C/w Pantoprazole 40 mg po Daily  Hypoalbuminemia -Patient's Albumin Trend: Recent Labs  Lab 06/30/22 0913 07/02/22 0152 07/03/22 0323  ALBUMIN 4.1 3.9 3.3*  -Continue to Monitor and Trend and repeat CMP in the AM   DVT prophylaxis: enoxaparin (LOVENOX) injection 40 mg Start: 07/01/22 0800 SCDs Start: 06/30/22 1643 SCDs Start: 06/30/22 0957    Code Status: Full Code Family Communication: No family currently at bedside  Disposition Plan:  Level of care: Telemetry Medical Status is: Inpatient Remains inpatient appropriate because: PT OT recommending CIR once he is medically stable   Consultants:  Orthopedic  surgery  Procedures:  As delineated as above  Antimicrobials:  Anti-infectives (From admission, onward)    Start     Dose/Rate Route Frequency Ordered Stop   06/30/22 2100  ceFAZolin (ANCEF) IVPB 2g/100 mL premix        2 g 200 mL/hr over 30 Minutes Intravenous Every 8 hours 06/30/22 1642 07/01/22 1547   06/30/22 1100  ceFAZolin (ANCEF) IVPB 2g/100 mL premix        2 g 200 mL/hr over 30 Minutes Intravenous On call to O.R. 06/30/22 1005 06/30/22 1300   06/30/22 1008  ceFAZolin (ANCEF) 2-4 GM/100ML-% IVPB       Note to Pharmacy: Lurena Nida: cabinet override      06/30/22 1008 06/30/22 1315       Subjective: Seen and examined at bedside this morning is much more awake and alert and oriented.  Subsequently this afternoon became very agitated.  Per the orthopedic team he  has been picking at his dressings and not compliant with his weightbearing status.  He denies any complaints at this time when I saw him earlier this morning and felt "all right and making it."  Objective: Vitals:   07/02/22 2050 07/03/22 0456 07/03/22 0729 07/03/22 1512  BP: 109/73 108/79 113/79 116/70  Pulse: 82 92 (!) 104 (!) 104  Resp:  18 17 18   Temp:   98 F (36.7 C) 98.9 F (37.2 C)  TempSrc:      SpO2:  94% 96% 98%  Weight:      Height:        Intake/Output Summary (Last 24 hours) at 07/03/2022 1829 Last data filed at 07/03/2022 1556 Gross per 24 hour  Intake 460.67 ml  Output 975 ml  Net -514.33 ml   Filed Weights   06/30/22 1019 07/01/22 1000  Weight: 77.1 kg 78.7 kg   Examination: Physical Exam:  Constitutional: WN/WD overweight Caucasian male in no acute distress Respiratory: Diminished to auscultation bilaterally, no wheezing, rales, rhonchi or crackles. Normal respiratory effort and patient is not tachypenic. No accessory muscle use.  Unlabored breathing Cardiovascular: RRR, no murmurs / rubs / gallops. S1 and S2 auscultated. No extremity edema.  Abdomen: Soft, non-tender, mildly  distended secondary body habitus. Bowel sounds positive.  GU: Deferred. Musculoskeletal: No clubbing / cyanosis of digits/nails. No joint deformity upper and lower extremities Skin: No rashes, lesions, ulcers on limited skin evaluation. No induration; Warm and dry.  Neurologic: CN 2-12 grossly intact with no focal deficits. Romberg sign cerebellar reflexes not assessed.  Psychiatric: Normal judgment and insight. Alert and oriented x 3.  He is much more awake and alert today and has an appropriate affect   Data Reviewed: I have personally reviewed following labs and imaging studies  CBC: Recent Labs  Lab 06/29/22 2342 06/30/22 0913 07/01/22 0356 07/02/22 0152 07/03/22 0323  WBC 11.4* 13.3* 12.9* 14.2* 8.6  NEUTROABS 6.3  --   --  11.5* 6.0  HGB 12.5* 11.7* 10.3* 9.2* 8.0*  HCT 36.5* 33.4* 30.8* 27.6* 24.1*  MCV 93.1 92.5 96.9 96.8 96.8  PLT 481* 381 350 333 263   Basic Metabolic Panel: Recent Labs  Lab 06/29/22 2342 06/30/22 0913 07/01/22 0356 07/02/22 0152 07/03/22 0323  NA 134* 132* 133* 135 134*  K 3.5 3.6 4.4 3.9 3.4*  CL 97* 99 99 98 99  CO2 19* 20* 23 23 26   GLUCOSE 103* 88 123* 100* 98  BUN 15 13 14  25* 15  CREATININE 1.09 0.99 1.13 1.35* 0.94  CALCIUM 10.0 9.3 9.4 9.5 8.9  MG  --   --   --  2.2 1.9  PHOS  --   --   --  4.3 2.9   GFR: Estimated Creatinine Clearance: 97.1 mL/min (by C-G formula based on SCr of 0.94 mg/dL). Liver Function Tests: Recent Labs  Lab 06/30/22 0913 07/02/22 0152 07/03/22 0323  AST 24 29 25   ALT 27 18 14   ALKPHOS 73 53 55  BILITOT 1.1 0.5 1.0  PROT 7.0 7.1 6.2*  ALBUMIN 4.1 3.9 3.3*   No results for input(s): "LIPASE", "AMYLASE" in the last 168 hours. Recent Labs  Lab 07/03/22 0323  AMMONIA 18   Coagulation Profile: No results for input(s): "INR", "PROTIME" in the last 168 hours. Cardiac Enzymes: No results for input(s): "CKTOTAL", "CKMB", "CKMBINDEX", "TROPONINI" in the last 168 hours. BNP (last 3 results) No results  for input(s): "PROBNP" in the last 8760 hours. HbA1C: No results  for input(s): "HGBA1C" in the last 72 hours. CBG: Recent Labs  Lab 07/01/22 2008  GLUCAP 109*   Lipid Profile: No results for input(s): "CHOL", "HDL", "LDLCALC", "TRIG", "CHOLHDL", "LDLDIRECT" in the last 72 hours. Thyroid Function Tests: Recent Labs    07/03/22 0323  TSH 0.375   Anemia Panel: Recent Labs    07/02/22 0152 07/03/22 0323  VITAMINB12 112* 2,098*  FOLATE 17.0  --   FERRITIN 148  --   TIBC 330  --   IRON 13*  --   RETICCTPCT 2.0  --    Sepsis Labs: No results for input(s): "PROCALCITON", "LATICACIDVEN" in the last 168 hours.  Recent Results (from the past 240 hour(s))  Surgical pcr screen     Status: Abnormal   Collection Time: 06/30/22  9:00 AM   Specimen: Nasal Mucosa; Nasal Swab  Result Value Ref Range Status   MRSA, PCR NEGATIVE NEGATIVE Final   Staphylococcus aureus POSITIVE (A) NEGATIVE Final    Comment: (NOTE) The Xpert SA Assay (FDA approved for NASAL specimens in patients 10 years of age and older), is one component of a comprehensive surveillance program. It is not intended to diagnose infection nor to guide or monitor treatment. Performed at [facility], [address], [place], [place] [address]      Radiology Studies: CT HEAD WO CONTRAST ( )  Result Date: 07/02/2022 CLINICAL DATA:  Mental status change, unknown cause. EXAM: CT HEAD WITHOUT CONTRAST TECHNIQUE: Contiguous axial images were obtained from the base of the skull through the vertex without intravenous contrast. RADIATION DOSE REDUCTION: This exam was performed according to the departmental dose-optimization program which includes automated exposure control, adjustment of the mA and/or kV according to patient size and/or use of iterative reconstruction technique. COMPARISON:  None Available. FINDINGS: Brain: No acute intracranial hemorrhage. Gray-white differentiation is preserved. No hydrocephalus or  extra-axial collection. No mass effect or midline shift. Vascular: No hyperdense vessel or unexpected calcification. Skull: No calvarial fracture or suspicious bone lesion. Skull base is unremarkable. Sinuses/Orbits: Unremarkable. Other: None. IMPRESSION: No acute intracranial abnormality. Electronically Signed   By: Orvan Falconer M.D.   On: 07/02/2022 20:04    Scheduled Meds:  amLODipine  10 mg Oral Daily   buPROPion  300 mg Oral Daily   busPIRone  5 mg Oral BID   Chlorhexidine Gluconate Cloth  6 each Topical Q0600   cholecalciferol  2,000 Units Oral Daily   vitamin B-12  1,000 mcg Oral Daily   docusate sodium  100 mg Oral BID   enoxaparin (LOVENOX) injection  40 mg Subcutaneous Q24H   feeding supplement  237 mL Oral BID BM   FLUoxetine  20 mg Oral Daily   folic acid  1 mg Oral Daily   gabapentin  100 mg Oral BID   iron polysaccharides  150 mg Oral Daily   magnesium oxide  400 mg Oral BID   metoprolol succinate  25 mg Oral Daily   multivitamin with minerals  1 tablet Oral Daily   mupirocin ointment  1 Application Nasal BID   pantoprazole  40 mg Oral Daily   potassium chloride  10 mEq Oral Daily   pravastatin  40 mg Oral Daily   thiamine  100 mg Oral Daily   Or   thiamine  100 mg Intravenous Daily   Continuous Infusions:  methocarbamol (ROBAXIN) IV      LOS: 3 days   Marguerita Merles, DO [facility] Available via Epic secure chat 7am-7pm After  these hours, please refer to coverage provider listed on [email or web address] 07/03/2022, 6:29 PM

## 2022-07-03 NOTE — Progress Notes (Signed)
Orthopaedic Trauma Service Progress Note  Patient ID: Thomas Williamson MRN: [id number] DOB/AGE: 1975/02/12 47 y.o.  Subjective:  Sitting up in bed Reports getting out of bed on his own and hitting L hip and now requesting something to "chill" him out Continues to pick at surgical dressing and has in fact removed it again Continues to take of PRAFO boot on L leg, currently not on   Still confused  I asked how therapy went yesterday and he said he did quite a bit with an exercise ball and medicine ball.  Therapy notes only show that he worked on bed mobility and transfers.   Labs are reasonable this am  H/h is trending down but this is expected with his surgery and injury    Head CT is clear  Still unable to recall posterior hip precautions and what touch down weightbearing is   ROS As above  Objective:   VITALS:   Vitals:   07/02/22 2015 07/02/22 2050 07/03/22 0456 07/03/22 0729  BP:  109/73 108/79 113/79  Pulse:  82 92 (!) 104  Resp:   18 17  Temp: 98.7 F (37.1 C)   98 F (36.7 C)  TempSrc: Oral     SpO2:   94% 96%  Weight:      Height:        Estimated body mass index is 25.62 kg/m as calculated from the following:   Height as of this encounter: 5\' 9"  (1.753 m).   Weight as of this encounter: 78.7 kg.   Intake/Output      05/30 0701 05/31 0700 05/31 0701 06/01 0700   P.O. 580    Total Intake(mL/kg) 580 (7.4)    Urine (mL/kg/hr) 875 (0.5)    Total Output 875    Net -295         Urine Occurrence 3 x 1 x   Stool Occurrence  1 x     LABS  Results for orders placed or performed during the hospital encounter of 06/30/22 (from the past 24 hour(s))  TSH     Status: None   Collection Time: 07/03/22  3:23 AM  Result Value Ref Range   TSH 0.375 0.350 - 4.500 uIU/mL  RPR     Status: None   Collection Time: 07/03/22  3:23 AM  Result Value Ref Range   RPR Ser Ql NON REACTIVE NON REACTIVE   Vitamin B12     Status: Abnormal   Collection Time: 07/03/22  3:23 AM  Result Value Ref Range   Vitamin B-12 2,098 (H) 180 - 914 pg/mL  Ammonia     Status: None   Collection Time: 07/03/22  3:23 AM  Result Value Ref Range   Ammonia 18 9 - 35 umol/L  CBC with Differential/Platelet     Status: Abnormal   Collection Time: 07/03/22  3:23 AM  Result Value Ref Range   WBC 8.6 4.0 - 10.5 K/uL   RBC 2.49 (L) 4.22 - 5.81 MIL/uL   Hemoglobin 8.0 (L) 13.0 - 17.0 g/dL   HCT 40.9 (L) 81.1 - 91.4 %   MCV 96.8 80.0 - 100.0 fL   MCH 32.1 26.0 - 34.0 pg   MCHC 33.2 30.0 - 36.0 g/dL   RDW 78.2 95.6 - 21.3 %   Platelets 263 150 - 400  K/uL   nRBC 0.0 0.0 - 0.2 %   Neutrophils Relative % 70 %   Neutro Abs 6.0 1.7 - 7.7 K/uL   Lymphocytes Relative 19 %   Lymphs Abs 1.6 0.7 - 4.0 K/uL   Monocytes Relative 10 %   Monocytes Absolute 0.8 0.1 - 1.0 K/uL   Eosinophils Relative 1 %   Eosinophils Absolute 0.1 0.0 - 0.5 K/uL   Basophils Relative 0 %   Basophils Absolute 0.0 0.0 - 0.1 K/uL   Immature Granulocytes 0 %   Abs Immature Granulocytes 0.02 0.00 - 0.07 K/uL  Comprehensive metabolic panel     Status: Abnormal   Collection Time: 07/03/22  3:23 AM  Result Value Ref Range   Sodium 134 (L) 135 - 145 mmol/L   Potassium 3.4 (L) 3.5 - 5.1 mmol/L   Chloride 99 98 - 111 mmol/L   CO2 26 22 - 32 mmol/L   Glucose, Bld 98 70 - 99 mg/dL   BUN 15 6 - 20 mg/dL   Creatinine, Ser 1.61 0.61 - 1.24 mg/dL   Calcium 8.9 8.9 - 09.6 mg/dL   Total Protein 6.2 (L) 6.5 - 8.1 g/dL   Albumin 3.3 (L) 3.5 - 5.0 g/dL   AST 25 15 - 41 U/L   ALT 14 0 - 44 U/L   Alkaline Phosphatase 55 38 - 126 U/L   Total Bilirubin 1.0 0.3 - 1.2 mg/dL   GFR, Estimated >04 >54 mL/min   Anion gap 9 5 - 15  Magnesium     Status: None   Collection Time: 07/03/22  3:23 AM  Result Value Ref Range   Magnesium 1.9 1.7 - 2.4 mg/dL  Phosphorus     Status: None   Collection Time: 07/03/22  3:23 AM  Result Value Ref Range   Phosphorus 2.9  2.5 - 4.6 mg/dL     PHYSICAL EXAM:   Gen: in bed, sitting up. Ate all his breakfast. Alert but confused  Lungs: unlabored  Cardiac: reg Abd: NT, + BS  Ext:       Left Lower Extremity              Dressing L hip is not present, again                         Incision clean and not draining                         New mepilex applied              PRAFO boot is not on either, again                         I placed PRAFO back on              Ext warm              + DP pulse             No DCT             Compartments are soft             SPN, TN sensation intact             Diminished DPN sensation             no EHL or ankle extension noted  Ankle and toe flexion intact  + Quad set  Assessment/Plan: 3 Days Post-Op     Anti-infectives (From admission, onward)    Start     Dose/Rate Route Frequency Ordered Stop   06/30/22 2100  ceFAZolin (ANCEF) IVPB 2g/100 mL premix        2 g 200 mL/hr over 30 Minutes Intravenous Every 8 hours 06/30/22 1642 07/01/22 1547   06/30/22 1100  ceFAZolin (ANCEF) IVPB 2g/100 mL premix        2 g 200 mL/hr over 30 Minutes Intravenous On call to O.R. 06/30/22 1005 06/30/22 1300   06/30/22 1008  ceFAZolin (ANCEF) 2-4 GM/100ML-% IVPB       Note to Pharmacy: Jamelle Rushing, GRETA: cabinet override      06/30/22 1008 06/30/22 1315     .  POD/HD#: 63  47 year old male s/p with left acetabulum fracture dislocation   -Closed left transverse posterior wall acetabular fracture dislocation with foot drop s/p ORIF L acetabulum Weightbearing Touchdown weightbearing left leg using crutches or walker.  Will advance weightbearing in about 8 weeks               ROM/Activity                         Posterior hip precautions for 12 weeks.  Activity as tolerated while maintaining weightbearing restrictions and range of motion precautions.               Wound care                         ok to leave wound open to air as pt continues to pick dressing  off   Ok to clean with soap and water   PT and OT   XRT for heterotopic ossification prophylaxis has been completed    I placed order for [employer] to start the process for AFO for his foot drop.  We discussed potential long recovery process for this.   - Pain management:             Multimodal             Encourage oral medications over IV   - ABL anemia/Hemodynamics             monitor  Cbc in am   Consider tranfusion for hgb <7 or if he becomes symptomatic    - Medical issues              Per primary   - DVT/PE prophylaxis:             Lovenox and SCDs while inpatient             Discharged on Eliquis 2.5 mg p.o. 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  Labs show vitamin D deficiency.  Supplement.  Will also check a testosterone panel in the setting of chronic alcoholism and marijuana use   - Activity:             As above   - Impediments to fracture healing:             Chronic alcohol use  Nicotine dependence             Regular marijuana use             Vitamin D deficiency   - Dispo:             Ortho issues addressed.  Therapy evaluations             Elevated risk for complications including DTs, disruption of repair if he continues to get up without assistance, etc   Mearl Latin, PA-C [phone] (C) 07/03/2022, 9:44 AM  [facility] [address] [place] [address] [phone] [phone] (F)    After 5pm and on the weekends please log on to Amion, go to orthopaedics and the look under the [facility] Call for the provider(s) on call. You can also call our office at [phone] and then follow the prompts to be connected to the call team.  Patient ID: Thomas Williamson, male   DOB: 08-29-1975, 47 y.o.   MRN: [id number]

## 2022-07-03 NOTE — Progress Notes (Addendum)
Physical Therapy Treatment Patient Details Name: Thomas Williamson MRN: [id number] DOB: 08-03-1975 Today's Date: 07/03/2022   History of Present Illness 47 y.o. male presenting 5/28 after a fall when tripping over his dog. He was found to have left acetabular fracture and hip location, is now s/p ORIF L acetabulum fracture dislocation with foot drop 06/30/22. PMH significant of ETOH use d/o, bipolar d/o, chronic systolic CHF, and HTN.    PT Comments    Progressing towards functional goals. Tolerated gait in room up to 12 feet today with min assist for RW control and intermittent max verbal cues for safety and sequencing while maintaining TDWB on LLE. Still a bit confused, able to recall month and year, that he is at [facility], but thought he was in [place]. Pt father reports not back to baseline cognition yet. Performed LE exercises, encouraged to perform between therapy sessions to limit atrophy from immobility. Reviewed PRAFO use, positioning, and posterior hip precautions, however has poor recall. Reviewed with patient's father as well. Patient will continue to benefit from skilled physical therapy services to further improve independence with functional mobility.    Recommendations for follow up therapy are one component of a multi-disciplinary discharge planning process, led by the attending physician.  Recommendations may be updated based on patient status, additional functional criteria and insurance authorization.     Assistance Recommended at Discharge Frequent or constant Supervision/Assistance  Patient can return home with the following Direct supervision/assist for medications management;Direct supervision/assist for financial management;Assist for transportation;Help with stairs or ramp for entrance;A lot of help with walking and/or transfers;A lot of help with bathing/dressing/bathroom   Equipment Recommendations   (defer to post acute; likely RW)    Recommendations for Other Services Rehab  consult     Precautions / Restrictions Precautions Precautions: Posterior Hip Precaution Booklet Issued: Yes (comment) Precaution Comments: pt with no recall of precautions Restrictions Weight Bearing Restrictions: Yes LLE Weight Bearing: Touchdown weight bearing     Mobility  Bed Mobility Overal bed mobility: Needs Assistance Bed Mobility: Supine to Sit     Supine to sit: Min guard     General bed mobility comments: min guard for safety. No physical assist required but needed max cues to hip precautions.    Transfers Overall transfer level: Needs assistance Equipment used: Rolling walker (2 wheels) Transfers: Sit to/from Stand Sit to Stand: Min assist           General transfer comment: Min assist for boost to stand and to maintain TDWB and hip precautions through LLE. Cues for sequencing and awareness.    Ambulation/Gait Ambulation/Gait assistance: Min assist Gait Distance (Feet): 12 Feet Assistive device: Rolling walker (2 wheels) Gait Pattern/deviations: Step-to pattern Gait velocity: decr Gait velocity interpretation: <1.31 ft/sec, indicative of household ambulator   General Gait Details: Educated on safe AD use with RW. Maintains TDWB on Lt, at times NWB, with min assist and tactile cues for recall. Assistance with RW control during turns. Max VC at times for following instructions properly due to confusion.   Stairs             Wheelchair Mobility    Modified Rankin (Stroke Patients Only)       Balance Overall balance assessment: Needs assistance Sitting-balance support: Feet supported Sitting balance-Leahy Scale: Fair     Standing balance support: Bilateral upper extremity supported Standing balance-Leahy Scale: Poor  Cognition Arousal/Alertness: Awake/alert Behavior During Therapy: Impulsive Overall Cognitive Status: Impaired/Different from baseline Area of Impairment: Memory, Following  commands, Safety/judgement, Awareness, Problem solving, Orientation, Attention                 Orientation Level: Disoriented to, Place (corrected himself with cues as to what city he was in (initially stated [place])) Current Attention Level: Focused Memory: Decreased recall of precautions, Decreased short-term memory Following Commands: Follows one step commands inconsistently Safety/Judgement: Decreased awareness of safety, Decreased awareness of deficits Awareness: Intellectual Problem Solving: Slow processing, Difficulty sequencing, Requires verbal cues, Requires tactile cues General Comments: Impulsive. Requires cues for precautions and directions with turning. Father reports not at baseline.        Exercises General Exercises - Lower Extremity Ankle Circles/Pumps: Strengthening, Both, 10 reps, Supine (unable to DF Lt ankle) Quad Sets: Strengthening, Both, 10 reps, Seated Gluteal Sets: Strengthening, Both, 10 reps, Seated    General Comments        Pertinent Vitals/Pain Pain Assessment Pain Assessment: Faces Faces Pain Scale: Hurts even more Pain Location: LLE and hip Pain Descriptors / Indicators: Aching, Jabbing, Pounding Pain Intervention(s): Monitored during session, Repositioned, Patient requesting pain meds-RN notified    Home Living Family/patient expects to be discharged to:: Private residence Living Arrangements: Parent Available Help at Discharge: Family;Available 24 hours/day Type of Home: House Home Access: Stairs to enter Entrance Stairs-Rails: Can reach both;Right;Left Entrance Stairs-Number of Steps: 3 from front entrance, half step from garage Alternate Level Stairs-Number of Steps: 10 steps Home Layout: Two level;Able to live on main level with bedroom/bathroom Home Equipment: Shower seat - built in      Prior Function            PT Goals (current goals can now be found in the care plan section) Acute Rehab PT Goals Patient Stated Goal:  return home to golf with his dad PT Goal Formulation: With patient Time For Goal Achievement: 07/16/22 Potential to Achieve Goals: Good Progress towards PT goals: Progressing toward goals    Frequency    Min 4X/week      PT Plan Current plan remains appropriate    Co-evaluation              AM-PAC PT "6 Clicks" Mobility   Outcome Measure  Help needed turning from your back to your side while in a flat bed without using bedrails?: A Little Help needed moving from lying on your back to sitting on the side of a flat bed without using bedrails?: A Little Help needed moving to and from a bed to a chair (including a wheelchair)?: A Little Help needed standing up from a chair using your arms (e.g., wheelchair or bedside chair)?: A Little Help needed to walk in hospital room?: A Lot Help needed climbing 3-5 steps with a railing? : Total 6 Click Score: 15    End of Session Equipment Utilized During Treatment: Gait belt Activity Tolerance: Patient tolerated treatment well Patient left: with call bell/phone within reach;in chair;with chair alarm set;with family/visitor present (PRAFO in place; pillow wedged for adduction) Nurse Communication: Other (comment) (Called for pain medication per pt request, has call bell in place.) PT Visit Diagnosis: Unsteadiness on feet (R26.81);Pain;Muscle weakness (generalized) (M62.81) Pain - Right/Left: Left Pain - part of body: Leg     Time: 6578-4696 PT Time Calculation (min) (ACUTE ONLY): 28 min  Charges:  $Gait Training: 8-22 mins $Therapeutic Activity: 8-22 mins  Kathlyn Sacramento, PT, DPT Physical Therapist Acute Rehabilitation Services [facility] [phone]    Berton Mount 07/03/2022, 12:25 PM

## 2022-07-03 NOTE — Progress Notes (Signed)
Occupational Therapy Treatment Patient Details Name: Thomas Williamson MRN: [id number] DOB: [birth year] Today's Date: 07/03/2022   History of present illness 47 y.o. male presenting 5/28 after a fall when tripping over his dog. He was found to have left acetabular fracture and hip location, is now s/p ORIF L acetabulum fracture dislocation with foot drop 06/30/22. PMH significant of ETOH use d/o, bipolar d/o, chronic systolic CHF, and HTN.   OT comments  Pt continues to be limited by impaired cognition causing decreased recall of posterior hip precautions with mobility. Reinforced use of hip kit, pt needing cues for sequencing and problem solving. Due to pt's decreased compliance with WB status, session limited in ambulation, Pt needing OT supporting foot underneath their heel to maintain precaution. SLUMs administered, indicating cognitive impairment, pt noted to be having trouble with much of the STM questions. OT to continue to progress pt as able. DC plans update for AIR as patient does possess the potential to return to Mod I functioning and has the ability to tolerate >3hrs of therapy.    Recommendations for follow up therapy are one component of a multi-disciplinary discharge planning process, led by the attending physician.  Recommendations may be updated based on patient status, additional functional criteria and insurance authorization.    Assistance Recommended at Discharge Frequent or constant Supervision/Assistance  Patient can return home with the following  Assistance with cooking/housework;Assist for transportation;A little help with bathing/dressing/bathroom;A little help with walking and/or transfers;Direct supervision/assist for financial management;Direct supervision/assist for medications management;Help with stairs or ramp for entrance   Equipment Recommendations  BSC/3in1;Other (comment) (Sock aid, reacher, long handled sponge, leg lifter)    Recommendations for Other Services Rehab  consult    Precautions / Restrictions Precautions Precautions: Posterior Hip Precaution Booklet Issued: Yes (comment) Restrictions Weight Bearing Restrictions: Yes LLE Weight Bearing: Touchdown weight bearing       Mobility Bed Mobility Overal bed mobility: Needs Assistance Bed Mobility: Supine to Sit, Sit to Supine     Supine to sit: Min guard Sit to supine: Min guard        Transfers Overall transfer level: Needs assistance   Transfers: Sit to/from Stand Sit to Stand: Min guard           General transfer comment: Cues to keep R hip extended to maintain hip precautions     Balance Overall balance assessment: Needs assistance Sitting-balance support: Feet supported Sitting balance-Leahy Scale: Fair     Standing balance support: Bilateral upper extremity supported, During functional activity Standing balance-Leahy Scale: Poor                             ADL either performed or assessed with clinical judgement   ADL                       Lower Body Dressing: With adaptive equipment;Sitting/lateral leans;Min guard;Cueing for sequencing Lower Body Dressing Details (indicate cue type and reason): Pt needing cues to reinforce learning at to problem solve. Continued AE education             Functional mobility during ADLs: Minimal assistance;Rolling walker (2 wheels) General ADL Comments: Pt ambulated ~21ft at bedside, needing constant verbal and tactile cues to maintain WB precautions, pt with poor compliance. OT providing heel support for pt to keep weight of RLE to ambulate back to bed safely.    Extremity/Trunk Assessment  Vision       Perception     Praxis      Cognition Arousal/Alertness: Awake/alert Behavior During Therapy: Impulsive Overall Cognitive Status: Impaired/Different from baseline Area of Impairment: Memory, Following commands, Safety/judgement, Problem solving                      Memory: Decreased short-term memory, Decreased recall of precautions Following Commands: Follows one step commands inconsistently Safety/Judgement: Decreased awareness of safety, Decreased awareness of deficits   Problem Solving: Slow processing, Difficulty sequencing, Requires verbal cues, Requires tactile cues General Comments: Pt able to recall 1/3 precautions, aware that he can not bend/flex. Pt scored 19/30 on the SLUMS indicating cognitive impairment, would benefit from further cogntive assessment.        Exercises      Shoulder Instructions       General Comments      Pertinent Vitals/ Pain       Pain Assessment Pain Assessment: Faces Faces Pain Scale: Hurts even more Pain Location: LLE and hip Pain Descriptors / Indicators: Aching, Jabbing, Pounding Pain Intervention(s): Monitored during session, Repositioned  Home Living                                          Prior Functioning/Environment              Frequency  Min 3X/week        Progress Toward Goals  OT Goals(current goals can now be found in the care plan section)  Progress towards OT goals: Progressing toward goals  Acute Rehab OT Goals Patient Stated Goal: To get back home OT Goal Formulation: With patient Time For Goal Achievement: 07/15/22 Potential to Achieve Goals: Good  Plan Discharge plan needs to be updated;Frequency remains appropriate    Co-evaluation                 AM-PAC OT "6 Clicks" Daily Activity     Outcome Measure   Help from another person eating meals?: None Help from another person taking care of personal grooming?: A Little Help from another person toileting, which includes using toliet, bedpan, or urinal?: A Little Help from another person bathing (including washing, rinsing, drying)?: A Lot Help from another person to put on and taking off regular upper body clothing?: None Help from another person to put on and taking off regular lower  body clothing?: A Little (with AE) 6 Click Score: 19    End of Session Equipment Utilized During Treatment: Gait belt;Rolling walker (2 wheels)  OT Visit Diagnosis: Unsteadiness on feet (R26.81);Pain;Other abnormalities of gait and mobility (R26.89) Pain - Right/Left: Left Pain - part of body: Hip;Leg   Activity Tolerance Patient tolerated treatment well   Patient Left in bed;with call bell/phone within reach;Other (comment);with family/visitor present;with bed alarm set (abduction wedge in place)   Nurse Communication Mobility status        Time: 1412-1440 OT Time Calculation (min): 28 min  Charges: OT General Charges $OT Visit: 1 Visit OT Treatments $Self Care/Home Management : 8-22 mins $Therapeutic Activity: 8-22 mins  07/03/2022  AB, OTR/L  Acute Rehabilitation Services  Office: [phone]   [name] 07/03/2022, 4:19 PM

## 2022-07-04 DIAGNOSIS — F1029 Alcohol dependence with unspecified alcohol-induced disorder: Secondary | ICD-10-CM | POA: Diagnosis not present

## 2022-07-04 DIAGNOSIS — I426 Alcoholic cardiomyopathy: Secondary | ICD-10-CM | POA: Diagnosis not present

## 2022-07-04 DIAGNOSIS — S32402A Unspecified fracture of left acetabulum, initial encounter for closed fracture: Secondary | ICD-10-CM | POA: Diagnosis not present

## 2022-07-04 DIAGNOSIS — F419 Anxiety disorder, unspecified: Secondary | ICD-10-CM | POA: Diagnosis not present

## 2022-07-04 LAB — COMPREHENSIVE METABOLIC PANEL
ALT: 15 U/L (ref 0–44)
AST: 24 U/L (ref 15–41)
Albumin: 3.2 g/dL — ABNORMAL LOW (ref 3.5–5.0)
Alkaline Phosphatase: 63 U/L (ref 38–126)
Anion gap: 10 (ref 5–15)
BUN: 14 mg/dL (ref 6–20)
CO2: 24 mmol/L (ref 22–32)
Calcium: 9.1 mg/dL (ref 8.9–10.3)
Chloride: 101 mmol/L (ref 98–111)
Creatinine, Ser: 0.95 mg/dL (ref 0.61–1.24)
GFR, Estimated: >60 mL/min (ref >60)
Glucose, Bld: 107 mg/dL — ABNORMAL HIGH (ref 70–99)
Potassium: 3.7 mmol/L (ref 3.5–5.1)
Sodium: 135 mmol/L (ref 135–145)
Total Bilirubin: 0.7 mg/dL (ref 0.3–1.2)
Total Protein: 6.4 g/dL — ABNORMAL LOW (ref 6.5–8.1)

## 2022-07-04 LAB — CBC WITH DIFFERENTIAL/PLATELET
Abs Immature Granulocytes: 0.03 10*3/uL (ref 0.00–0.07)
Basophils Absolute: 0 10*3/uL (ref 0.0–0.1)
Basophils Relative: 0 %
Eosinophils Absolute: 0.1 10*3/uL (ref 0.0–0.5)
Eosinophils Relative: 1 %
HCT: 24.3 % — ABNORMAL LOW (ref 39.0–52.0)
Hemoglobin: 8.1 g/dL — ABNORMAL LOW (ref 13.0–17.0)
Immature Granulocytes: 0 %
Lymphocytes Relative: 24 %
Lymphs Abs: 1.8 10*3/uL (ref 0.7–4.0)
MCH: 32.3 pg (ref 26.0–34.0)
MCHC: 33.3 g/dL (ref 30.0–36.0)
MCV: 96.8 fL (ref 80.0–100.0)
Monocytes Absolute: 0.8 10*3/uL (ref 0.1–1.0)
Monocytes Relative: 11 %
Neutro Abs: 4.8 10*3/uL (ref 1.7–7.7)
Neutrophils Relative %: 64 %
Platelets: 317 10*3/uL (ref 150–400)
RBC: 2.51 MIL/uL — ABNORMAL LOW (ref 4.22–5.81)
RDW: 12.5 % (ref 11.5–15.5)
WBC: 7.5 10*3/uL (ref 4.0–10.5)
nRBC: 0 % (ref 0.0–0.2)

## 2022-07-04 LAB — PHOSPHORUS: Phosphorus: 3.6 mg/dL (ref 2.5–4.6)

## 2022-07-04 LAB — MAGNESIUM: Magnesium: 2 mg/dL (ref 1.7–2.4)

## 2022-07-04 LAB — TESTOSTERONE: Testosterone: 116 ng/dL — ABNORMAL LOW (ref 264–916)

## 2022-07-04 MED ORDER — MELATONIN 5 MG PO TABS
5.0000 mg | ORAL_TABLET | Freq: Every evening | ORAL | Status: DC | PRN
  Administered 2022-07-04 – 2022-07-09 (×3): 5 mg via ORAL
  Filled 2022-07-04 (×3): qty 1

## 2022-07-04 NOTE — Plan of Care (Signed)

## 2022-07-04 NOTE — Progress Notes (Signed)
Inpatient Rehab Admissions:  Inpatient Rehab Consult received.  I met with patient and his mother Thurston Hole at the bedside for rehabilitation assessment and to discuss goals and expectations of an inpatient rehab admission.  Discussed average length of stay, insurance authorization requirement, discharge home after completion of CIR. Both acknowledged understanding. Pt interested in pursuing CIR. Pt's mother is supportive. Pt's mother confirmed that she and pt's father will be able to provide 24/7 support for pt after discharge. Will continue to follow.  Signed: Wolfgang Phoenix, MS, CCC-SLP Admissions Coordinator [phone]

## 2022-07-04 NOTE — Progress Notes (Signed)
PROGRESS NOTE    Thomas Williamson  ZHY:[id number] DOB: 30-May-1975 DOA: 06/30/2022 PCP: Nelwyn Salisbury, MD   Brief Narrative:  The patient is a 47 year old Caucasian male with past medical history significant for but limited to alcohol use disorder, bipolar disorder, chronic systolic CHF, hypertension as well as other comorbidities who presented with a fall.  Reports that he was coming down the stairs with his dog and the dog stopped and the patient pivoted and he tripped over the dog and fell down the rest of the stairs.  He says that he had pain in the hip and overall send notes that he "drank a double" last night prior to fall and feels that it did not contribute to the fall.  He recently returned from rehab for his alcoholism and only drinks a few drinks daily now.  He was at the [facility] ED and transferred to [facility] and was found to have a left acetabular fracture and hip dislocation.  Orthopedic surgery evaluated and took the patient for surgical intervention on 06/30/2022.   **Interim History  Patient is Postoperative day 4. Pain regimen has been initiated but may have been too much so adjusted.  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  PT OT to further evaluate and treat and they are recommending CIR and he may be a candidate  Patient appeared more confused postoperatively and could be in the setting of narcotics given that he was getting 15 mg of oxycodone IR every 4 hours as needed but appears to be that he is taking it regularly.  Have adjusted medications and obtained a head CT scan and placed him on delirium precautions.  CT head was normal.  Patient was much more awake and alert yesterday a.m. and this AM however he continues to withdraw a little bit. He told me that he had relapsed and had a  drink the day before coming in and became agitated yesterday afternoon.  Lorazepam protocol been reinitiated on 07/03/22.  Patient has removed wound dressing and still not really compliant with his weightbearing restrictions but seems more appropriate today.   Assessment and Plan:    Hip dislocation/fracture s/p ORIF Aceteabulum Fracture Dislocation with Foot Drop POD 4 -Apparently mechanical fall resulting in hip fracture -Orthopedics consulted - this is a complicated fracture and Dr. Carola Frost is planning to perform the surgery so he was transferred from [facility] to [facility] --SCDs overnight and now on Enoxaparin 40 mg sq q24h while inpatient and then to be discharged on Eliquis 2.5 mg p.o. twice daily for 30 days -Vitamin D deficiency with low vitamin D of 10.13 and so orthopedic surgery was checking a testosterone panel in the setting of his chronic alcoholism and marijuana use; initiated on cholecalciferol 2000 units p.o. daily will have orthopedic surgery adjust further -Continue current Pain Regimen for now -[facility] team consult for rehab placement -Will need PT/OT consult post-operatively and this is is done and they are recommending CIR -Hip fracture order set utilized -TXA per orthopedics -Fascia iliacus block ordered per anesthesia -Further Management per Orthopedic Surgery and they are recommending TDWB Left Leg 8 weeks with Posterior Hip Precautions x 12 weeks  -Orthopedic surgery recommending daily dressing changes starting on 07/03/2022 for the left hip -Orthopedic surgery also recommending 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 twice daily, MiraLAX 17 g daily as needed for mild constipation and if  necessary will change to senna docusate 1 tab p.o. twice daily; patient also has a bisacodyl 5 mg p.o. daily as needed for Moderate constipation    Pre-operative stratification -Orthopedic/spinal surgery is associated with an intermediate (1-5%) cardiovascular risk for cardiac death and nonfatal MI -With his h/o CHF, his revised cardiac index gives a risk estimate of 6.0% -Because of this risk, he is recommended to have pre-operative EKG testing prior to surgery; this was done in the ER -His Detsky's Modified Cardiac Risk Index score is Class I, with a low cardiac risk -It is reasonable for him to go to the OR without additional evaluation   ETOH Dependence with concern for withdrawal -Patient with chronic ETOH dependence -He was recently discharged from rehab and had started drinking again intermittently and stated that he drank the day before prior to falling -CIWA protocol reinitiated  -Folate, thiamine, and MVI ordered -Will provide symptom-triggered BZD (ativan per CIWA protocol) only since the patient is able to communicate; is very confused today and delirious likely in the setting of narcotics; and has no history of severe withdrawal. -TOC team consult for substance abuse counseling -Will also check UDS but not done yet -Consider offering a medication for Alcohol Use Disorder at the time of d/c, to include Disulfuram; Naltrexone; or Acamprosate.  He has been on naltrexone but appears to take it inconsistently.  Confusion and Delirium, improving, concern for some withdrawal aspect -Suspect this is a post anesthetic effect versus narcotics with poor clearance due to elevated creatinine -Placed on delirium precautions -Minimize narcotics and adjustments as necessary as above -Obtain a Head CT scan without contrast and this showed no acute intracranial abnormalities -Check TSH which was 0.375, RPR which was nonreactive, vitamin B12 level of 2098, and ammonia level was 18 -He became  extremely agitated afternoon with concern for his withdrawal to his lorazepam CIWA protocol has been restarted; may need some Librium   Chronic Systolic CHF -EF in 2020 was 30-35%, 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 -Continue Buproprion 300 mg po Daily, Buspirone 5 mg po BID, and Fluoxetine 20 mg po Daily   HLD -Continue Pravastatin 40 mg po Daily   Renal Insuffiencey, improved  -BUN/Cr Trend: Recent Labs  Lab 06/18/22 1154 06/29/22 2342 06/30/22 0913 07/01/22 0356 07/02/22 0152 07/03/22 0323 07/04/22 0111  BUN 10 15 13 14  25* 15 14  CREATININE 0.99 1.09 0.99 1.13 1.35* 0.94 0.95  -Hold HCTZ and Losartan and IV fluid hydration  had been started -Avoid Nephrotoxic Medications, Contrast Dyes, Hypotension and Dehydration to Ensure Adequate Renal Perfusion and will need to Renally Adjust Meds -Continue to Monitor and Trend Renal Function carefully and repeat CMP in the AM    Hyponatremia -Mild. Na+ Trend: Recent Labs  Lab 06/18/22 1154 06/29/22 2342 06/30/22 0913 07/01/22 0356 07/02/22 0152 07/03/22 0323 07/04/22 0111  NA 136 134* 132* 133* 135 134* 135  -Discontinue po HCTZ while hospitalized  -Continue to Monitor and Trend and repeat CMP in the AM   Leukocytosis, improved -WBC Trend: Recent Labs  Lab 06/29/22 2342  06/30/22 0913 07/01/22 0356 07/02/22 0152 07/03/22 0323 07/04/22 0111  WBC 11.4* 13.3* 12.9* 14.2* 8.6 7.5  -Likely Reactive in the setting of above -Continue to Monitor for S/Sx of Infection and repeat CBC in the AM   Normocytic Anemia -Hgb/Hct Trend: Recent Labs  Lab 06/29/22 2342 06/30/22 0913 07/01/22 0356 07/02/22 0152 07/03/22 0323 07/04/22 0111  HGB 12.5* 11.7* 10.3* 9.2* 8.0* 8.1*  HCT 36.5* 33.4* 30.8* 27.6* 24.1* 24.3*  MCV 93.1 92.5 96.9 96.8 96.8 96.8  -Checked Anemia Panel and showed an iron level of 13, UIBC of 317, TIBC of 330, saturation ratios of 4%, ferritin level 140, folate level 17.0 and vitamin B12 level 112 -Will start vitamin B supplementation with cyanocobalamin (IM injection today and po starting 07/03/22) and iron supplementation with Niferex 150 mg po Daily -Continue to Monitor for S/Sx of Bleeding; No overt bleeding noted -Repeat CBC in the AM   GERD/GI Prophylaxis -C/w Pantoprazole 40 mg po Daily  Hypoalbuminemia -Patient's Albumin Trend: Recent Labs  Lab 06/30/22 0913 07/02/22 0152 07/03/22 0323 07/04/22 0111  ALBUMIN 4.1 3.9 3.3* 3.2*  -Continue to Monitor and Trend and repeat CMP in the AM   DVT prophylaxis: enoxaparin (LOVENOX) injection 40 mg Start: 07/01/22 0800 SCDs Start: 06/30/22 1643 SCDs Start: 06/30/22 0957    Code Status: Full Code Family Communication: No family currently at bedside  Disposition Plan:  Level of care: Telemetry Medical Status is: Inpatient Remains inpatient appropriate because: PT/OT recommending CIR   Consultants:  Orthopedic Surgery  Procedures:  As delineated as above  Antimicrobials:  Anti-infectives (From admission, onward)    Start     Dose/Rate Route Frequency Ordered Stop   06/30/22 2100  ceFAZolin (ANCEF) IVPB 2g/100 mL premix        2 g 200 mL/hr over 30 Minutes Intravenous Every 8 hours 06/30/22 1642 07/01/22 1547   06/30/22 1100  ceFAZolin (ANCEF) IVPB 2g/100 mL premix        2  g 200 mL/hr over 30 Minutes Intravenous On call to O.R. 06/30/22 1005 06/30/22 1300   06/30/22 1008  ceFAZolin (ANCEF) 2-4 GM/100ML-% IVPB       Note to Pharmacy: Jamelle Rushing, GRETA: cabinet override      06/30/22 1008 06/30/22 1315       Subjective: Pain and examined at bedside this morning he is much more awake and alert and oriented.  Continues to have some discomfort.  Was complaining that his removal was not working.  States that he wants "something to knock him out".  Still very interested in pursuing CIR.  No other concerns or complaints this time and overall feels okay.  Objective: Vitals:   07/03/22 1900 07/04/22 0522 07/04/22 0903 07/04/22 1617  BP: (!) 103/54 113/74 112/61 109/77  Pulse: (!) 104 96 95   Resp: 17 (!) 23 18 16   Temp: 98.5 F (36.9 C) 98.5 F (36.9 C)  TempSrc: Oral Oral    SpO2: 99% 99%  98%  Weight:      Height:        Intake/Output Summary (Last 24 hours) at 07/04/2022 1657 Last data filed at 07/04/2022 1500 Gross per 24 hour  Intake 358 ml  Output 450 ml  Net -92 ml   Filed Weights   06/30/22 1019 07/01/22 1000  Weight: 77.1 kg 78.7 kg   Examination: Physical Exam:  Constitutional: WN/WD overweight Caucasian male in no acute distress appears calm does appear little uncomfortable Respiratory: Diminished to auscultation bilaterally, no wheezing, rales, rhonchi or crackles. Normal respiratory effort and patient is not tachypenic. No accessory muscle use.  Cardiovascular: RRR, no murmurs / rubs / gallops. S1 and S2 auscultated. No extremity edema. Abdomen: Soft, non-tender, distended secondary to body habitus. Bowel sounds positive.  GU: Deferred. Musculoskeletal: No clubbing / cyanosis of digits/nails. No joint deformity upper and lower extremities.  Skin: No rashes, lesions, ulcers on limited skin evaluation. No induration; Warm and dry.  Neurologic: CN 2-12 grossly intact with no focal deficits. Romberg sign and cerebellar reflexes not assessed.   Psychiatric: Normal judgment and insight. Alert and oriented x 3. Normal mood and appropriate affect.   Data Reviewed: I have personally reviewed following labs and imaging studies  CBC: Recent Labs  Lab 06/29/22 2342 06/30/22 0913 07/01/22 0356 07/02/22 0152 07/03/22 0323 07/04/22 0111  WBC 11.4* 13.3* 12.9* 14.2* 8.6 7.5  NEUTROABS 6.3  --   --  11.5* 6.0 4.8  HGB 12.5* 11.7* 10.3* 9.2* 8.0* 8.1*  HCT 36.5* 33.4* 30.8* 27.6* 24.1* 24.3*  MCV 93.1 92.5 96.9 96.8 96.8 96.8  PLT 481* 381 350 333 263 317   Basic Metabolic Panel: Recent Labs  Lab 06/30/22 0913 07/01/22 0356 07/02/22 0152 07/03/22 0323 07/04/22 0111  NA 132* 133* 135 134* 135  K 3.6 4.4 3.9 3.4* 3.7  CL 99 99 98 99 101  CO2 20* 23 23 26 24   GLUCOSE 88 123* 100* 98 107*  BUN 13 14 25* 15 14  CREATININE 0.99 1.13 1.35* 0.94 0.95  CALCIUM 9.3 9.4 9.5 8.9 9.1  MG  --   --  2.2 1.9 2.0  PHOS  --   --  4.3 2.9 3.6   GFR: Estimated Creatinine Clearance: 96.1 mL/min (by C-G formula based on SCr of 0.95 mg/dL). Liver Function Tests: Recent Labs  Lab 06/30/22 0913 07/02/22 0152 07/03/22 0323 07/04/22 0111  AST 24 29 25 24   ALT 27 18 14 15   ALKPHOS 73 53 55 63  BILITOT 1.1 0.5 1.0 0.7  PROT 7.0 7.1 6.2* 6.4*  ALBUMIN 4.1 3.9 3.3* 3.2*   No results for input(s): "LIPASE", "AMYLASE" in the last 168 hours. Recent Labs  Lab 07/03/22 0323  AMMONIA 18   Coagulation Profile: No results for input(s): "INR", "PROTIME" in the last 168 hours. Cardiac Enzymes: No results for input(s): "CKTOTAL", "CKMB", "CKMBINDEX", "TROPONINI" in the last 168 hours. BNP (last 3 results) No results for input(s): "PROBNP" in the last 8760 hours. HbA1C: No results for input(s): "HGBA1C" in the last 72 hours. CBG: Recent Labs  Lab 07/01/22 2008  GLUCAP 109*   Lipid Profile: No results for input(s): "CHOL", "HDL", "LDLCALC", "TRIG", "CHOLHDL", "LDLDIRECT" in the last 72 hours. Thyroid Function Tests: Recent Labs     07/03/22 0323  TSH 0.375   Anemia Panel: Recent Labs    07/02/22 0152 07/03/22 0323  VITAMINB12 112* 2,098*  FOLATE 17.0  --  FERRITIN 148  --   TIBC 330  --   IRON 13*  --   RETICCTPCT 2.0  --    Sepsis Labs: No results for input(s): "PROCALCITON", "LATICACIDVEN" in the last 168 hours.  Recent Results (from the past 240 hour(s))  Surgical pcr screen     Status: Abnormal   Collection Time: 06/30/22  9:00 AM   Specimen: Nasal Mucosa; Nasal Swab  Result Value Ref Range Status   MRSA, PCR NEGATIVE NEGATIVE Final   Staphylococcus aureus POSITIVE (A) NEGATIVE Final    Comment: (NOTE) The Xpert SA Assay (FDA approved for NASAL specimens in patients 56 years of age and older), is one component of a comprehensive surveillance program. It is not intended to diagnose infection nor to guide or monitor treatment. Performed at [facility], [address], [place], [place] [address]     Radiology Studies: CT HEAD WO CONTRAST ( )  Result Date: 07/02/2022 CLINICAL DATA:  Mental status change, unknown cause. EXAM: CT HEAD WITHOUT CONTRAST TECHNIQUE: Contiguous axial images were obtained from the base of the skull through the vertex without intravenous contrast. RADIATION DOSE REDUCTION: This exam was performed according to the departmental dose-optimization program which includes automated exposure control, adjustment of the mA and/or kV according to patient size and/or use of iterative reconstruction technique. COMPARISON:  None Available. FINDINGS: Brain: No acute intracranial hemorrhage. Gray-white differentiation is preserved. No hydrocephalus or extra-axial collection. No mass effect or midline shift. Vascular: No hyperdense vessel or unexpected calcification. Skull: No calvarial fracture or suspicious bone lesion. Skull base is unremarkable. Sinuses/Orbits: Unremarkable. Other: None. IMPRESSION: No acute intracranial abnormality. Electronically Signed   By: Orvan Falconer M.D.    On: 07/02/2022 20:04    Scheduled Meds:  amLODipine  10 mg Oral Daily   buPROPion  300 mg Oral Daily   busPIRone  5 mg Oral BID   Chlorhexidine Gluconate Cloth  6 each Topical Q0600   cholecalciferol  2,000 Units Oral Daily   vitamin B-12  1,000 mcg Oral Daily   docusate sodium  100 mg Oral BID   enoxaparin (LOVENOX) injection  40 mg Subcutaneous Q24H   feeding supplement  237 mL Oral BID BM   FLUoxetine  20 mg Oral Daily   folic acid  1 mg Oral Daily   gabapentin  100 mg Oral BID   iron polysaccharides  150 mg Oral Daily   magnesium oxide  400 mg Oral BID   metoprolol succinate  25 mg Oral Daily   multivitamin with minerals  1 tablet Oral Daily   mupirocin ointment  1 Application Nasal BID   pantoprazole  40 mg Oral Daily   potassium chloride  10 mEq Oral Daily   pravastatin  40 mg Oral Daily   thiamine  100 mg Oral Daily   Or   thiamine  100 mg Intravenous Daily   Continuous Infusions:  methocarbamol (ROBAXIN) IV      LOS: 4 days   Marguerita Merles, DO [facility] Available via Epic secure chat 7am-7pm After these hours, please refer to coverage provider listed on [email or web address] 07/04/2022, 4:57 PM

## 2022-07-04 NOTE — Progress Notes (Signed)
Physical Therapy Treatment Patient Details Name: Thomas Williamson MRN: [id number] DOB: [birth year] Today's Date: 07/04/2022   History of Present Illness 47 y.o. male presenting 5/28 after a fall when tripping over his dog. He was found to have left acetabular fracture and hip location, is now s/p ORIF L acetabulum fracture dislocation with foot drop 06/30/22. PMH significant of ETOH use d/o, bipolar d/o, chronic systolic CHF, and HTN.    PT Comments    Pt received in supine and agreeable to session. Pt anxious upon arrival looking for his glasses, however was relieved when they were found. Pt requiring cues for 1/3 hip precautions and demonstrates good recall at the end of the session. Pt able to tolerate increased gait distance with good adherence to LLE TDWB. Pt requires cues for RW management due to pt attempting to push RW while advancing RLE, however is able to improve. Gait distance limited by BUE fatigue. Pt continues to benefit from PT services to progress toward functional mobility goals.     Recommendations for follow up therapy are one component of a multi-disciplinary discharge planning process, led by the attending physician.  Recommendations may be updated based on patient status, additional functional criteria and insurance authorization.     Assistance Recommended at Discharge Frequent or constant Supervision/Assistance  Patient can return home with the following Direct supervision/assist for medications management;Direct supervision/assist for financial management;Assist for transportation;Help with stairs or ramp for entrance;A lot of help with walking and/or transfers;A lot of help with bathing/dressing/bathroom   Equipment Recommendations   (defer to post acute; likely RW)    Recommendations for Other Services       Precautions / Restrictions Precautions Precautions: Posterior Hip Precaution Booklet Issued: Yes (comment) Precaution Comments: Pt able to recall 2/3  precautions Restrictions Weight Bearing Restrictions: Yes LLE Weight Bearing: Touchdown weight bearing     Mobility  Bed Mobility Overal bed mobility: Needs Assistance Bed Mobility: Supine to Sit     Supine to sit: Min guard     General bed mobility comments: Min guard for safety and cues for precautions    Transfers Overall transfer level: Needs assistance Equipment used: Rolling walker (2 wheels) Transfers: Sit to/from Stand Sit to Stand: Min guard           General transfer comment: Good carryover of LLE placement. Cues for hand placement    Ambulation/Gait Ambulation/Gait assistance: Min guard Gait Distance (Feet): 40 Feet Assistive device: Rolling walker (2 wheels) Gait Pattern/deviations: Step-to pattern Gait velocity: decr     General Gait Details: Pt able to perform hop-to pattern with LLE NWB, however returns to TDWB with increased BUE fatigue. Min guard for safety, but no overt LOB noted       Balance Overall balance assessment: Needs assistance Sitting-balance support: Feet supported Sitting balance-Leahy Scale: Fair Sitting balance - Comments: sitting EOB   Standing balance support: Bilateral upper extremity supported, During functional activity, Reliant on assistive device for balance Standing balance-Leahy Scale: Poor Standing balance comment: with RW support                            Cognition Arousal/Alertness: Awake/alert Behavior During Therapy: Anxious Overall Cognitive Status: Impaired/Different from baseline                                 General Comments: Pt requiring cues for internal rotation precaution, however demonstrated  good carryover with positioning in the recliner at the end of the session. Pt anxious about his posessions and searching for his glasses, stating "the nurse hid them from me". Pt also looking for his vape at the end of the session with the nurse later reporting that she had already  told him that his mother took it home.        Exercises      General Comments        Pertinent Vitals/Pain Pain Assessment Pain Assessment: Faces Faces Pain Scale: Hurts even more Pain Location: LLE and hip Pain Descriptors / Indicators: Aching, Jabbing, Pounding Pain Intervention(s): Monitored during session, Repositioned     PT Goals (current goals can now be found in the care plan section) Acute Rehab PT Goals Patient Stated Goal: return home to golf with his dad PT Goal Formulation: With patient Time For Goal Achievement: 07/16/22 Potential to Achieve Goals: Good Progress towards PT goals: Progressing toward goals    Frequency    Min 4X/week      PT Plan Current plan remains appropriate       AM-PAC PT "6 Clicks" Mobility   Outcome Measure  Help needed turning from your back to your side while in a flat bed without using bedrails?: A Little Help needed moving from lying on your back to sitting on the side of a flat bed without using bedrails?: A Little Help needed moving to and from a bed to a chair (including a wheelchair)?: A Little Help needed standing up from a chair using your arms (e.g., wheelchair or bedside chair)?: A Little Help needed to walk in hospital room?: A Little Help needed climbing 3-5 steps with a railing? : Total 6 Click Score: 16    End of Session Equipment Utilized During Treatment: Gait belt Activity Tolerance: Patient tolerated treatment well Patient left: with call bell/phone within reach;in chair;with chair alarm set Nurse Communication: Mobility status (Pt requesting pain medicine) PT Visit Diagnosis: Unsteadiness on feet (R26.81);Pain;Muscle weakness (generalized) (M62.81) Pain - Right/Left: Left Pain - part of body: Leg     Time: 1914-7829 PT Time Calculation (min) (ACUTE ONLY): 20 min  Charges:  $Gait Training: 8-22 mins                     Johny Shock, PTA Acute Rehabilitation Services Secure Chat Preferred   [phone]    Johny Shock 07/04/2022, 4:29 PM

## 2022-07-05 DIAGNOSIS — F419 Anxiety disorder, unspecified: Secondary | ICD-10-CM | POA: Diagnosis not present

## 2022-07-05 DIAGNOSIS — S32402A Unspecified fracture of left acetabulum, initial encounter for closed fracture: Secondary | ICD-10-CM | POA: Diagnosis not present

## 2022-07-05 DIAGNOSIS — F1029 Alcohol dependence with unspecified alcohol-induced disorder: Secondary | ICD-10-CM | POA: Diagnosis not present

## 2022-07-05 DIAGNOSIS — I426 Alcoholic cardiomyopathy: Secondary | ICD-10-CM | POA: Diagnosis not present

## 2022-07-05 LAB — CBC WITH DIFFERENTIAL/PLATELET
Abs Immature Granulocytes: 0.03 10*3/uL (ref 0.00–0.07)
Basophils Absolute: 0 10*3/uL (ref 0.0–0.1)
Basophils Relative: 0 %
Eosinophils Absolute: 0.2 10*3/uL (ref 0.0–0.5)
Eosinophils Relative: 3 %
HCT: 23.7 % — ABNORMAL LOW (ref 39.0–52.0)
Hemoglobin: 7.8 g/dL — ABNORMAL LOW (ref 13.0–17.0)
Immature Granulocytes: 0 %
Lymphocytes Relative: 25 %
Lymphs Abs: 1.9 10*3/uL (ref 0.7–4.0)
MCH: 31.6 pg (ref 26.0–34.0)
MCHC: 32.9 g/dL (ref 30.0–36.0)
MCV: 96 fL (ref 80.0–100.0)
Monocytes Absolute: 0.8 10*3/uL (ref 0.1–1.0)
Monocytes Relative: 11 %
Neutro Abs: 4.7 10*3/uL (ref 1.7–7.7)
Neutrophils Relative %: 61 %
Platelets: 333 10*3/uL (ref 150–400)
RBC: 2.47 MIL/uL — ABNORMAL LOW (ref 4.22–5.81)
RDW: 12.3 % (ref 11.5–15.5)
WBC: 7.7 10*3/uL (ref 4.0–10.5)
nRBC: 0 % (ref 0.0–0.2)

## 2022-07-05 LAB — COMPREHENSIVE METABOLIC PANEL
ALT: 15 U/L (ref 0–44)
AST: 18 U/L (ref 15–41)
Albumin: 3 g/dL — ABNORMAL LOW (ref 3.5–5.0)
Alkaline Phosphatase: 62 U/L (ref 38–126)
Anion gap: 9 (ref 5–15)
BUN: 16 mg/dL (ref 6–20)
CO2: 23 mmol/L (ref 22–32)
Calcium: 9 mg/dL (ref 8.9–10.3)
Chloride: 103 mmol/L (ref 98–111)
Creatinine, Ser: 0.93 mg/dL (ref 0.61–1.24)
GFR, Estimated: >60 mL/min (ref >60)
Glucose, Bld: 129 mg/dL — ABNORMAL HIGH (ref 70–99)
Potassium: 3.5 mmol/L (ref 3.5–5.1)
Sodium: 135 mmol/L (ref 135–145)
Total Bilirubin: 0.7 mg/dL (ref 0.3–1.2)
Total Protein: 6.4 g/dL — ABNORMAL LOW (ref 6.5–8.1)

## 2022-07-05 LAB — MAGNESIUM: Magnesium: 2.1 mg/dL (ref 1.7–2.4)

## 2022-07-05 LAB — PHOSPHORUS: Phosphorus: 4.8 mg/dL — ABNORMAL HIGH (ref 2.5–4.6)

## 2022-07-05 MED ORDER — POTASSIUM CHLORIDE CRYS ER 20 MEQ PO TBCR
40.0000 meq | EXTENDED_RELEASE_TABLET | Freq: Two times a day (BID) | ORAL | Status: AC
  Administered 2022-07-05 (×2): 40 meq via ORAL
  Filled 2022-07-05 (×2): qty 2

## 2022-07-05 MED ORDER — METHOCARBAMOL 500 MG PO TABS
1000.0000 mg | ORAL_TABLET | Freq: Four times a day (QID) | ORAL | Status: DC | PRN
  Administered 2022-07-05 – 2022-07-09 (×12): 1000 mg via ORAL
  Filled 2022-07-05 (×13): qty 2

## 2022-07-05 NOTE — Progress Notes (Signed)
PROGRESS NOTE    Thomas Williamson  ZOX:[id number] DOB: 1975-04-01 DOA: 06/30/2022 PCP: Nelwyn Salisbury, MD   Brief Narrative:  The patient is a 47 year old Caucasian male with past medical history significant for but limited to alcohol use disorder, bipolar disorder, chronic systolic CHF, hypertension as well as other comorbidities who presented with a fall.  Reports that he was coming down the stairs with his dog and the dog stopped and the patient pivoted and he tripped over the dog and fell down the rest of the stairs.  He says that he had pain in the hip and overall send notes that he "drank a double" last night prior to fall and feels that it did not contribute to the fall.  He recently returned from rehab for his alcoholism and only drinks a few drinks daily now.  He was at the [facility] ED and transferred to [facility] and was found to have a left acetabular fracture and hip dislocation.  Orthopedic surgery evaluated and took the patient for surgical intervention on 06/30/2022.   **Interim History  Patient is Postoperative day 5. Pain regimen has been initiated but may have been too much so adjusted.  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  PT OT to further evaluate and treat and they are recommending CIR and he may be a candidate  Patient appeared more confused postoperatively and could be in the setting of narcotics given that he was getting 15 mg of oxycodone IR every 4 hours as needed but appears to be that he is taking it regularly.  Have adjusted medications and obtained a head CT scan and placed him on delirium precautions.  CT head was normal.  Patient was much more awake and alert yesterday a.m. and this AM however he continues to withdraw a little bit. He told me that he had relapsed and had a  drink the day before coming in and became agitated yesterday afternoon.  Lorazepam protocol been reinitiated on 07/03/22.  Patient has removed wound dressing and still not really compliant with his weightbearing restrictions but seems more appropriate today.   Assessment and Plan:  Hip dislocation/fracture s/p ORIF Aceteabulum Fracture Dislocation with Foot Drop POD 5 -Apparently mechanical fall resulting in hip fracture -Orthopedics consulted - this is a complicated fracture and Dr. Carola Frost is planning to perform the surgery so he was transferred from [facility] to [facility] --SCDs overnight and now on Enoxaparin 40 mg sq q24h while inpatient and then to be discharged on Eliquis 2.5 mg p.o. twice daily for 30 days -Vitamin D deficiency with low vitamin D of 10.13 and so orthopedic surgery was checking a testosterone panel in the setting of his chronic alcoholism and marijuana use; initiated on cholecalciferol 2000 units p.o. daily will have orthopedic surgery adjust further -Continue current Pain Regimen for now -[facility] team consult for rehab placement -Will need PT/OT consult post-operatively and this is is done and they are recommending CIR -Hip fracture order set utilized -TXA per orthopedics -Fascia iliacus block ordered per anesthesia -Further Management per Orthopedic Surgery and they are recommending TDWB Left Leg 8 weeks with Posterior Hip Precautions x 12 weeks  -Orthopedic surgery recommending daily dressing changes starting on 07/03/2022 for the left hip -Orthopedic surgery also recommending 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 twice daily, MiraLAX 17 g daily as needed for mild constipation and if  necessary will change to Senna-Docusate 1 tab p.o. twice daily; patient also has a Bisacodyl 5 mg p.o. daily as needed for Moderate constipation    Pre-operative Stratification -Orthopedic/spinal surgery is associated with an intermediate (1-5%) cardiovascular risk for cardiac death and nonfatal MI -With his h/o CHF, his revised cardiac index gives a risk estimate of 6.0% -Because of this risk, he is recommended to have pre-operative EKG testing prior to surgery; this was done in the ER -His Detsky's Modified Cardiac Risk Index score is Class I, with a low cardiac risk -It is reasonable for him to go to the OR without additional evaluation   ETOH Dependence with concern for withdrawal -Patient with chronic ETOH dependence -He was recently discharged from rehab and had started drinking again intermittently and stated that he drank the day before prior to falling -CIWA protocol reinitiated and he is doing better with CIWA Scores ranging from 1-5 Today; Got as High as 15 yesterday -Folate, thiamine, and MVI ordered -Will provide symptom-triggered BZD (ativan per CIWA protocol) only since the patient is able to communicate; is very confused today and delirious likely in the setting of narcotics; and has no history of severe withdrawal. -TOC team consult for substance abuse counseling -Will also check UDS but not done yet -Consider offering a medication for Alcohol Use Disorder at the time of d/c, to include Disulfuram; Naltrexone; or Acamprosate.  He has been on naltrexone but appears to take it inconsistently.  Confusion and Delirium, improving, concern for some withdrawal aspect -Suspect this is a post anesthetic effect versus narcotics with poor clearance due to elevated creatinine -Placed on delirium precautions -Minimize narcotics and adjustments as necessary as above -Obtain a Head CT scan without contrast and this showed no acute intracranial abnormalities -Check TSH which was 0.375, RPR  which was nonreactive, vitamin B12 level of 2098, and ammonia level was 18 -He became extremely agitated afternoon with concern for his withdrawal to his lorazepam CIWA protocol has been restarted; may need some Librium   Chronic Systolic CHF -EF in 2020 was 30-35%, 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 -Continue Buproprion 300 mg po Daily, Buspirone 5 mg po BID, and Fluoxetine 20 mg po Daily   HLD -Continue Pravastatin 40 mg po Daily   Renal Insuffiencey, improved  -BUN/Cr Trend: Recent Labs  Lab 06/29/22 2342 06/30/22 0913 07/01/22 0356 07/02/22 0152 07/03/22 0323 07/04/22 0111 07/05/22 0143  BUN 15 13 14  25* 15 14 16   CREATININE 1.09 0.99  1.13 1.35* 0.94 0.95 0.93  -Hold HCTZ and Losartan and IV fluid hydration had been started -Avoid Nephrotoxic Medications, Contrast Dyes, Hypotension and Dehydration to Ensure Adequate Renal Perfusion and will need to Renally Adjust Meds -Continue to Monitor and Trend Renal Function carefully and repeat CMP in the AM    Hyponatremia -Mild. Na+ Trend: Recent Labs  Lab 06/29/22 2342 06/30/22 0913 07/01/22 0356 07/02/22 0152 07/03/22 0323 07/04/22 0111 07/05/22 0143  NA 134* 132* 133* 135 134* 135 135  -Discontinue po HCTZ while hospitalized  -Continue to Monitor and Trend and repeat  CMP in the AM   Leukocytosis, improved -WBC Trend: Recent Labs  Lab 06/29/22 2342 06/30/22 0913 07/01/22 0356 07/02/22 0152 07/03/22 0323 07/04/22 0111 07/05/22 0143  WBC 11.4* 13.3* 12.9* 14.2* 8.6 7.5 7.7  -Likely Reactive in the setting of above -Continue to Monitor for S/Sx of Infection and repeat CBC in the AM   Normocytic Anemia -Hgb/Hct Trend: Recent Labs  Lab 06/29/22 2342 06/30/22 0913 07/01/22 0356 07/02/22 0152 07/03/22 0323 07/04/22 0111 07/05/22 0143  HGB 12.5* 11.7* 10.3* 9.2* 8.0* 8.1* 7.8*  HCT 36.5* 33.4* 30.8* 27.6* 24.1* 24.3* 23.7*  MCV 93.1 92.5 96.9 96.8 96.8 96.8 96.0  -Checked Anemia Panel and showed an iron level of 13, UIBC of 317, TIBC of 330, saturation ratios of 4%, ferritin level 140, folate level 17.0 and vitamin B12 level 112 -Will start vitamin B supplementation with cyanocobalamin (IM injection today and po starting 07/03/22) and iron supplementation with Niferex 150 mg po Daily -Continue to Monitor for S/Sx of Bleeding; No overt bleeding noted -Repeat CBC in the AM   GERD/GI Prophylaxis -C/w Pantoprazole 40 mg po Daily  Hypoalbuminemia -Patient's Albumin Trend: Recent Labs  Lab 06/30/22 0913 07/02/22 0152 07/03/22 0323 07/04/22 0111 07/05/22 0143  ALBUMIN 4.1 3.9 3.3* 3.2* 3.0*  -Continue to Monitor and Trend and repeat CMP in the AM   DVT prophylaxis: enoxaparin (LOVENOX) injection 40 mg Start: 07/01/22 0800 SCDs Start: 06/30/22 1643 SCDs Start: 06/30/22 0957    Code Status: Full Code Family Communication: Discussed with patient's father who is currently at bedside  Disposition Plan:  Level of care: Telemetry Medical Status is: Inpatient Remains inpatient appropriate because: PT OT recommending CIR and anticipating discharge in next 24 to 48 hours if CIWA scores remained stable and if bed is available   Consultants:  Orthopedic Surgery  Procedures:  As delineated as above  Antimicrobials:  Anti-infectives (From  admission, onward)    Start     Dose/Rate Route Frequency Ordered Stop   06/30/22 2100  ceFAZolin (ANCEF) IVPB 2g/100 mL premix        2 g 200 mL/hr over 30 Minutes Intravenous Every 8 hours 06/30/22 1642 07/01/22 1547   06/30/22 1100  ceFAZolin (ANCEF) IVPB 2g/100 mL premix        2 g 200 mL/hr over 30 Minutes Intravenous On call to O.R. 06/30/22 1005 06/30/22 1300   06/30/22 1008  ceFAZolin (ANCEF) 2-4 GM/100ML-% IVPB       Note to Pharmacy: Lurena Nida: cabinet override      06/30/22 1008 06/30/22 1315       Subjective: Seen and examined at bedside and he is much more awake and alert and not as confused.  Patient's father thinks he is doing better today.  No nausea or vomiting.  Patient feeling discouraged little bit.  No nausea or vomiting.  Continues to be interested in pursuing CIR.  No other concerns or  complaints at this time.  Objective: Vitals:   07/04/22 2003 07/05/22 0507 07/05/22 0748 07/05/22 1449  BP: 110/74 107/72 104/70 109/67  Pulse: 87 86 84 88  Resp: 17 17 18 18   Temp: 98.4 F (36.9 C)   97.9 F (36.6 C)  TempSrc: Oral     SpO2: 95% 100% 97% 98%  Weight:      Height:        Intake/Output Summary (Last 24 hours) at 07/05/2022 1557 Last data filed at 07/05/2022 1000 Gross per 24 hour  Intake 200 ml  Output 750 ml  Net -550 ml   Filed Weights   06/30/22 1019 07/01/22 1000  Weight: 77.1 kg 78.7 kg   Examination: Physical Exam:  Constitutional: WN/WD overweight Caucasian male in no acute distress appears calm Respiratory: Diminished to auscultation bilaterally, no wheezing, rales, rhonchi or crackles. Normal respiratory effort and patient is not tachypenic. No accessory muscle use.  Unlabored breathing Cardiovascular: RRR, no murmurs / rubs / gallops. S1 and S2 auscultated. No extremity edema.  Abdomen: Soft, non-tender, distended secondary to body habitus. Bowel sounds positive.  GU: Deferred. Musculoskeletal: No clubbing / cyanosis of digits/nails.   No appreciable joint deformities in upper or lower extremities Skin: No rashes, lesions, ulcers on limited skin evaluation. No induration; Warm and dry.  Neurologic: CN 2-12 grossly intact with no focal deficits.  Romberg sign and cerebellar reflexes not assessed.  Psychiatric: Normal judgment and insight. Alert and oriented x 3. Normal mood and appropriate affect.   Data Reviewed: I have personally reviewed following labs and imaging studies  CBC: Recent Labs  Lab 06/29/22 2342 06/30/22 0913 07/01/22 0356 07/02/22 0152 07/03/22 0323 07/04/22 0111 07/05/22 0143  WBC 11.4*   < > 12.9* 14.2* 8.6 7.5 7.7  NEUTROABS 6.3  --   --  11.5* 6.0 4.8 4.7  HGB 12.5*   < > 10.3* 9.2* 8.0* 8.1* 7.8*  HCT 36.5*   < > 30.8* 27.6* 24.1* 24.3* 23.7*  MCV 93.1   < > 96.9 96.8 96.8 96.8 96.0  PLT 481*   < > 350 333 263 317 333   < > = values in this interval not displayed.   Basic Metabolic Panel: Recent Labs  Lab 07/01/22 0356 07/02/22 0152 07/03/22 0323 07/04/22 0111 07/05/22 0143  NA 133* 135 134* 135 135  K 4.4 3.9 3.4* 3.7 3.5  CL 99 98 99 101 103  CO2 23 23 26 24 23   GLUCOSE 123* 100* 98 107* 129*  BUN 14 25* 15 14 16   CREATININE 1.13 1.35* 0.94 0.95 0.93  CALCIUM 9.4 9.5 8.9 9.1 9.0  MG  --  2.2 1.9 2.0 2.1  PHOS  --  4.3 2.9 3.6 4.8*   GFR: Estimated Creatinine Clearance: 98.2 mL/min (by C-G formula based on SCr of 0.93 mg/dL). Liver Function Tests: Recent Labs  Lab 06/30/22 0913 07/02/22 0152 07/03/22 0323 07/04/22 0111 07/05/22 0143  AST 24 29 25 24 18   ALT 27 18 14 15 15   ALKPHOS 73 53 55 63 62  BILITOT 1.1 0.5 1.0 0.7 0.7  PROT 7.0 7.1 6.2* 6.4* 6.4*  ALBUMIN 4.1 3.9 3.3* 3.2* 3.0*   No results for input(s): "LIPASE", "AMYLASE" in the last 168 hours. Recent Labs  Lab 07/03/22 0323  AMMONIA 18   Coagulation Profile: No results for input(s): "INR", "PROTIME" in the last 168 hours. Cardiac Enzymes: No results for input(s): "CKTOTAL", "CKMB", "CKMBINDEX",  "TROPONINI" in the last 168 hours. BNP (last  3 results) No results for input(s): "PROBNP" in the last 8760 hours. HbA1C: No results for input(s): "HGBA1C" in the last 72 hours. CBG: Recent Labs  Lab 07/01/22 2008  GLUCAP 109*   Lipid Profile: No results for input(s): "CHOL", "HDL", "LDLCALC", "TRIG", "CHOLHDL", "LDLDIRECT" in the last 72 hours. Thyroid Function Tests: Recent Labs    07/03/22 0323  TSH 0.375   Anemia Panel: Recent Labs    07/03/22 0323  VITAMINB12 2,098*   Sepsis Labs: No results for input(s): "PROCALCITON", "LATICACIDVEN" in the last 168 hours.  Recent Results (from the past 240 hour(s))  Surgical pcr screen     Status: Abnormal   Collection Time: 06/30/22  9:00 AM   Specimen: Nasal Mucosa; Nasal Swab  Result Value Ref Range Status   MRSA, PCR NEGATIVE NEGATIVE Final   Staphylococcus aureus POSITIVE (A) NEGATIVE Final    Comment: (NOTE) The Xpert SA Assay (FDA approved for NASAL specimens in patients 73 years of age and older), is one component of a comprehensive surveillance program. It is not intended to diagnose infection nor to guide or monitor treatment. Performed at [facility], [address], [place], [place] [address]     Radiology Studies: No results found.  Scheduled Meds:  amLODipine  10 mg Oral Daily   buPROPion  300 mg Oral Daily   busPIRone  5 mg Oral BID   cholecalciferol  2,000 Units Oral Daily   vitamin B-12  1,000 mcg Oral Daily   docusate sodium  100 mg Oral BID   enoxaparin (LOVENOX) injection  40 mg Subcutaneous Q24H   feeding supplement  237 mL Oral BID BM   FLUoxetine  20 mg Oral Daily   folic acid  1 mg Oral Daily   gabapentin  100 mg Oral BID   iron polysaccharides  150 mg Oral Daily   magnesium oxide  400 mg Oral BID   metoprolol succinate  25 mg Oral Daily   multivitamin with minerals  1 tablet Oral Daily   mupirocin ointment  1 Application Nasal BID   pantoprazole  40 mg Oral Daily   potassium  chloride  10 mEq Oral Daily   potassium chloride  40 mEq Oral BID   pravastatin  40 mg Oral Daily   thiamine  100 mg Oral Daily   Or   thiamine  100 mg Intravenous Daily   Continuous Infusions:  methocarbamol (ROBAXIN) IV      LOS: 5 days   Marguerita Merles, DO [facility] Available via Epic secure chat 7am-7pm After these hours, please refer to coverage provider listed on [email or web address] 07/05/2022, 3:57 PM

## 2022-07-06 ENCOUNTER — Inpatient Hospital Stay (HOSPITAL_COMMUNITY): Payer: BC Managed Care – PPO

## 2022-07-06 DIAGNOSIS — R7989 Other specified abnormal findings of blood chemistry: Secondary | ICD-10-CM

## 2022-07-06 DIAGNOSIS — F1029 Alcohol dependence with unspecified alcohol-induced disorder: Secondary | ICD-10-CM | POA: Diagnosis not present

## 2022-07-06 DIAGNOSIS — F419 Anxiety disorder, unspecified: Secondary | ICD-10-CM | POA: Diagnosis not present

## 2022-07-06 DIAGNOSIS — I426 Alcoholic cardiomyopathy: Secondary | ICD-10-CM | POA: Diagnosis not present

## 2022-07-06 DIAGNOSIS — S32402A Unspecified fracture of left acetabulum, initial encounter for closed fracture: Secondary | ICD-10-CM | POA: Diagnosis not present

## 2022-07-06 LAB — CBC WITH DIFFERENTIAL/PLATELET
Abs Immature Granulocytes: 0.05 10*3/uL (ref 0.00–0.07)
Basophils Absolute: 0 10*3/uL (ref 0.0–0.1)
Basophils Relative: 0 %
Eosinophils Absolute: 0.1 10*3/uL (ref 0.0–0.5)
Eosinophils Relative: 1 %
HCT: 26.9 % — ABNORMAL LOW (ref 39.0–52.0)
Hemoglobin: 9.1 g/dL — ABNORMAL LOW (ref 13.0–17.0)
Immature Granulocytes: 1 %
Lymphocytes Relative: 13 %
Lymphs Abs: 1.1 10*3/uL (ref 0.7–4.0)
MCH: 32 pg (ref 26.0–34.0)
MCHC: 33.8 g/dL (ref 30.0–36.0)
MCV: 94.7 fL (ref 80.0–100.0)
Monocytes Absolute: 1.2 10*3/uL — ABNORMAL HIGH (ref 0.1–1.0)
Monocytes Relative: 14 %
Neutro Abs: 5.7 10*3/uL (ref 1.7–7.7)
Neutrophils Relative %: 71 %
Platelets: 379 10*3/uL (ref 150–400)
RBC: 2.84 MIL/uL — ABNORMAL LOW (ref 4.22–5.81)
RDW: 12.1 % (ref 11.5–15.5)
WBC: 8.1 10*3/uL (ref 4.0–10.5)
nRBC: 0 % (ref 0.0–0.2)

## 2022-07-06 LAB — COMPREHENSIVE METABOLIC PANEL
ALT: 302 U/L — ABNORMAL HIGH (ref 0–44)
AST: 630 U/L — ABNORMAL HIGH (ref 15–41)
Albumin: 3.2 g/dL — ABNORMAL LOW (ref 3.5–5.0)
Alkaline Phosphatase: 196 U/L — ABNORMAL HIGH (ref 38–126)
Anion gap: 11 (ref 5–15)
BUN: 12 mg/dL (ref 6–20)
CO2: 23 mmol/L (ref 22–32)
Calcium: 9.3 mg/dL (ref 8.9–10.3)
Chloride: 102 mmol/L (ref 98–111)
Creatinine, Ser: 0.78 mg/dL (ref 0.61–1.24)
GFR, Estimated: >60 mL/min (ref >60)
Glucose, Bld: 130 mg/dL — ABNORMAL HIGH (ref 70–99)
Potassium: 4.1 mmol/L (ref 3.5–5.1)
Sodium: 136 mmol/L (ref 135–145)
Total Bilirubin: 1 mg/dL (ref 0.3–1.2)
Total Protein: 6.7 g/dL (ref 6.5–8.1)

## 2022-07-06 LAB — MAGNESIUM: Magnesium: 2 mg/dL (ref 1.7–2.4)

## 2022-07-06 LAB — PHOSPHORUS: Phosphorus: 3.8 mg/dL (ref 2.5–4.6)

## 2022-07-06 MED ORDER — SIMETHICONE 80 MG PO CHEW
80.0000 mg | CHEWABLE_TABLET | Freq: Once | ORAL | Status: AC
  Administered 2022-07-06: 80 mg via ORAL
  Filled 2022-07-06: qty 1

## 2022-07-06 NOTE — Progress Notes (Signed)
PROGRESS NOTE    Thomas Williamson  ZOX:[id number] DOB: [birth year] DOA: 06/30/2022 PCP: Nelwyn Salisbury, MD   Brief Narrative:  The patient is a 47 year old Caucasian male with past medical history significant for but limited to alcohol use disorder, bipolar disorder, chronic systolic CHF, hypertension as well as other comorbidities who presented with a fall.  Reports that he was coming down the stairs with his dog and the dog stopped and the patient pivoted and he tripped over the dog and fell down the rest of the stairs.  He says that he had pain in the hip and overall send notes that he "drank a double" last night prior to fall and feels that it did not contribute to the fall.  He recently returned from rehab for his alcoholism and only drinks a few drinks daily now.  He was at the [facility] ED and transferred to [facility] and was found to have a left acetabular fracture and hip dislocation.  Orthopedic surgery evaluated and took the patient for surgical intervention on 06/30/2022.   **Interim History  Patient is Postoperative day 6. Pain regimen has been initiated but may have been too much so adjusted.  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  PT OT to further evaluate and treat and they are recommending CIR and he may be a candidate  Patient appeared more confused postoperatively and could be in the setting of narcotics given that he was getting 15 mg of oxycodone IR every 4 hours as needed but appears to be that he is taking it regularly.  Have adjusted medications and obtained a head CT scan and placed him on delirium precautions.  CT head was normal.  Patient was much more awake and alert yesterday a.m. and this AM however he continues to withdraw a little bit. He told me that he had relapsed and had a  drink the day before coming in and became agitated yesterday afternoon.  Lorazepam protocol been reinitiated on 07/03/22.  Patient has removed wound dressing and still not really compliant with his weightbearing restrictions but seems more appropriate today.   Assessment and Plan:  Hip dislocation/fracture s/p ORIF Aceteabulum Fracture Dislocation with Foot Drop POD 6 -Apparently mechanical fall resulting in hip fracture -Orthopedics consulted - this is a complicated fracture and Dr. Carola Frost is planning to perform the surgery so he was transferred from [facility] --SCDs overnight and now on Enoxaparin 40 mg sq q24h while inpatient and then to be discharged on Eliquis 2.5 mg p.o. twice daily for 30 days -Vitamin D deficiency with low vitamin D of 10.13 and so orthopedic surgery was checking a testosterone panel in the setting of his chronic alcoholism and marijuana use; initiated on cholecalciferol 2000 units p.o. daily will have orthopedic surgery adjust further -Continue current Pain Regimen for now -[facility] team consult for rehab placement -Will need PT/OT consult post-operatively and this is is done and they are recommending CIR -Hip fracture order set utilized -TXA per orthopedics -Fascia iliacus block ordered per anesthesia -Further Management per Orthopedic Surgery and they are recommending TDWB Left Leg 8 weeks with Posterior Hip Precautions x 12 weeks  -Orthopedic surgery recommending daily dressing changes starting on 07/03/2022 for the left hip -Orthopedic surgery also recommending 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 twice daily, MiraLAX 17 g daily as needed for mild constipation and if  necessary will change to Senna-Docusate 1 tab p.o. twice daily; patient also has a Bisacodyl 5 mg p.o. daily as needed for Moderate constipation    Pre-operative Stratification -Orthopedic/spinal surgery is associated with an intermediate (1-5%) cardiovascular risk for cardiac death and nonfatal MI -With his h/o CHF, his revised cardiac index gives a risk estimate of 6.0% -Because of this risk, he is recommended to have pre-operative EKG testing prior to surgery; this was done in the ER -His Detsky's Modified Cardiac Risk Index score is Class I, with a low cardiac risk -It is reasonable for him to go to the OR without additional evaluation   ETOH Dependence with concern for withdrawal -Patient with chronic ETOH dependence -He was recently discharged from rehab and had started drinking again intermittently and stated that he drank the day before prior to falling -CIWA protocol reinitiated and he is doing better with CIWA Scores ranging from 1-5 Today; Got as High as 15 yesterday -Folate, thiamine, and MVI ordered -Will provide symptom-triggered BZD (ativan per CIWA protocol) only since the patient is able to communicate; is very confused today and delirious likely in the setting of narcotics; and has no history of severe withdrawal. -TOC team consult for substance abuse counseling -Will also check UDS but not done yet -Consider offering a medication for Alcohol Use Disorder at the time of d/c, to include Disulfuram; Naltrexone; or Acamprosate.  He has been on naltrexone but appears to take it inconsistently.  Confusion and Delirium, improving, concern for some withdrawal aspect -Suspect this is a post anesthetic effect versus narcotics with poor clearance due to elevated creatinine -Placed on delirium precautions -Minimize narcotics and adjustments as necessary as above -Obtain a Head CT scan without contrast and this showed no acute intracranial abnormalities -Check TSH which was 0.375, RPR  which was nonreactive, vitamin B12 level of 2098, and ammonia level was 18 -He became extremely agitated afternoon with concern for his withdrawal to his lorazepam CIWA protocol has been restarted; may need some Librium   Chronic Systolic CHF -EF in 2020 was 30-35%, 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 -Continue Buproprion 300 mg po Daily, Buspirone 5 mg po BID, and Fluoxetine 20 mg po Daily   HLD -Hold Pravastatin 40 mg po Daily given significantly abnormal LFTs  Renal Insuffiencey, improved  -BUN/Cr Trend: Recent Labs  Lab 06/29/22 2342 06/30/22 0913 07/01/22 0356 07/02/22 0152 07/03/22 0323 07/04/22 0111 07/05/22 0143  BUN 15 13 14  25* 15 14 16  CREATININE 1.09 0.99 1.13 1.35* 0.94 0.95 0.93  -Hold HCTZ and Losartan and IV fluid hydration had been started but now stopped -Avoid Nephrotoxic Medications, Contrast Dyes, Hypotension and Dehydration to Ensure Adequate Renal Perfusion and will need to Renally Adjust Meds -Continue to Monitor and Trend Renal Function carefully and repeat CMP in the AM   Abnormal LFTs -Unclear Etiology -LFT Trend: Recent Labs  Lab 06/30/22 0913 07/02/22 0152 07/03/22 0323 07/04/22 0111 07/05/22 0143 07/06/22 0948  AST 24 29 25 24 18  630*  ALT 27 18 14 15 15  302*  -RUQ U/S done and showed  "No significant sonographic abnormality of the liver or gallbladder.  Hypoechogenicity seen superior to the right hemidiaphragm is suspicious for pleural effusion." -Avoid Hepatotoxic Medications if possible  -Check Acute Hepatitis Panel in the AM -Consult GI for further Evaluation and Recc's   Hyponatremia -Mild. Na+ Trend: Recent Labs  Lab 06/29/22 2342 06/30/22 0913 07/01/22 0356 07/02/22 0152 07/03/22 0323 07/04/22 0111 07/05/22 0143  NA 134* 132* 133* 135 134* 135 135  -Discontinue po HCTZ while hospitalized  -Continue to Monitor and Trend and repeat CMP in the AM   Leukocytosis, improved -WBC Trend: Recent Labs  Lab 06/29/22 2342 06/30/22 0913 07/01/22 0356 07/02/22 0152 07/03/22 0323 07/04/22 0111 07/05/22 0143  WBC 11.4* 13.3* 12.9* 14.2* 8.6 7.5 7.7  -Likely Reactive in the setting of above -Continue to Monitor for S/Sx of Infection and repeat CBC in the AM   Normocytic Anemia -Hgb/Hct Trend: Recent Labs  Lab 06/29/22 2342 06/30/22 0913 07/01/22 0356 07/02/22 0152 07/03/22 0323 07/04/22 0111 07/05/22 0143  HGB 12.5* 11.7* 10.3* 9.2* 8.0* 8.1* 7.8*  HCT 36.5* 33.4* 30.8* 27.6* 24.1* 24.3* 23.7*  MCV 93.1 92.5 96.9 96.8 96.8 96.8 96.0  -Checked Anemia Panel and showed an iron level of 13, UIBC of 317, TIBC of 330, saturation ratios of 4%, ferritin level 140, folate level 17.0 and vitamin B12 level 112 -Will start vitamin B supplementation with cyanocobalamin (IM injection intially and po starting 07/03/22) and iron supplementation with Niferex 150 mg po Daily -Continue to Monitor for S/Sx of Bleeding; No overt bleeding noted -Repeat CBC in the AM   GERD/GI Prophylaxis -C/w Pantoprazole 40 mg po Daily  Hypoalbuminemia -Patient's Albumin Trend: Recent Labs  Lab 06/30/22 0913 07/02/22 0152 07/03/22 0323 07/04/22 0111 07/05/22 0143  ALBUMIN 4.1 3.9 3.3* 3.2* 3.0*  -Continue to Monitor and Trend and repeat CMP in the AM   DVT prophylaxis:  enoxaparin (LOVENOX) injection 40 mg Start: 07/01/22 0800 SCDs Start: 06/30/22 1643 SCDs Start: 06/30/22 0957    Code Status: Full Code Family Communication: No family present at bedside  Disposition Plan:  Level of care: Telemetry Medical Status is: Inpatient Remains inpatient appropriate because: Awaiting CIR placement   Consultants:  Orthopedic Surgery Gastroenterology   Procedures:  As delineated as above  Antimicrobials:  Anti-infectives (From admission, onward)    Start     Dose/Rate Route Frequency Ordered Stop   06/30/22 2100  ceFAZolin (ANCEF) IVPB 2g/100 mL premix        2 g 200 mL/hr over 30 Minutes Intravenous Every 8 hours 06/30/22 1642 07/01/22 1547   06/30/22 1100  ceFAZolin (ANCEF) IVPB 2g/100 mL premix        2 g 200 mL/hr over 30 Minutes Intravenous On call to O.R. 06/30/22 1005 06/30/22 1300   06/30/22 1008  ceFAZolin (ANCEF) 2-4 GM/100ML-% IVPB       Note to Pharmacy: Lurena Nida: cabinet override  06/30/22 1008 06/30/22 1315       Subjective: Seen and examined at bedside and doing okay.  Denies any nausea or vomiting.  Still has some pain.  No other concerns or complaints at this time but LFTs have trended up so we will consult GI.  Awaiting insurance authorization for CIR.  Objective: Vitals:   07/05/22 0748 07/05/22 1449 07/06/22 0816 07/06/22 1330  BP: 104/70 109/67 115/80 115/79  Pulse: 84 88 95 87  Resp: 18 18 18 18   Temp:  97.9 F (36.6 C) 97.7 F (36.5 C) 98.8 F (37.1 C)  TempSrc:   Oral Oral  SpO2: 97% 98% 100% 100%  Weight:      Height:        Intake/Output Summary (Last 24 hours) at 07/06/2022 1420 Last data filed at 07/06/2022 0800 Gross per 24 hour  Intake --  Output 1200 ml  Net -1200 ml   Filed Weights   06/30/22 1019 07/01/22 1000  Weight: 77.1 kg 78.7 kg   Examination: Physical Exam:  Constitutional: WN/WD overweight Caucasian male in no acute distress but then became a little tearful Respiratory: Diminished  to auscultation bilaterally, no wheezing, rales, rhonchi or crackles. Normal respiratory effort and patient is not tachypenic. No accessory muscle use.  Unlabored breathing Cardiovascular: RRR, no murmurs / rubs / gallops. S1 and S2 auscultated. No extremity edema Abdomen: Soft, non-tender, distended secondary to body habitus. Bowel sounds positive.  GU: Deferred. Musculoskeletal: No clubbing / cyanosis of digits/nails. No joint deformity upper and lower extremities. Skin: No rashes, lesions, ulcers on limited skin evaluation. No induration; Warm and dry.  Neurologic: CN 2-12 grossly intact with no focal deficits.  Romberg sign and cerebellar reflexes not assessed.  Psychiatric: Normal judgment and insight. Alert and oriented x 3.  A little tearful this morning  Data Reviewed: I have personally reviewed following labs and imaging studies  CBC: Recent Labs  Lab 07/02/22 0152 07/03/22 0323 07/04/22 0111 07/05/22 0143 07/06/22 0948  WBC 14.2* 8.6 7.5 7.7 8.1  NEUTROABS 11.5* 6.0 4.8 4.7 5.7  HGB 9.2* 8.0* 8.1* 7.8* 9.1*  HCT 27.6* 24.1* 24.3* 23.7* 26.9*  MCV 96.8 96.8 96.8 96.0 94.7  PLT 333 263 317 333 379   Basic Metabolic Panel: Recent Labs  Lab 07/02/22 0152 07/03/22 0323 07/04/22 0111 07/05/22 0143 07/06/22 0948  NA 135 134* 135 135 136  K 3.9 3.4* 3.7 3.5 4.1  CL 98 99 101 103 102  CO2 23 26 24 23 23   GLUCOSE 100* 98 107* 129* 130*  BUN 25* 15 14 16 12   CREATININE 1.35* 0.94 0.95 0.93 0.78  CALCIUM 9.5 8.9 9.1 9.0 9.3  MG 2.2 1.9 2.0 2.1 2.0  PHOS 4.3 2.9 3.6 4.8* 3.8   GFR: Estimated Creatinine Clearance: 114.2 mL/min (by C-G formula based on SCr of 0.78 mg/dL). Liver Function Tests: Recent Labs  Lab 07/02/22 0152 07/03/22 0323 07/04/22 0111 07/05/22 0143 07/06/22 0948  AST 29 25 24 18  630*  ALT 18 14 15 15  302*  ALKPHOS 53 55 63 62 196*  BILITOT 0.5 1.0 0.7 0.7 1.0  PROT 7.1 6.2* 6.4* 6.4* 6.7  ALBUMIN 3.9 3.3* 3.2* 3.0* 3.2*   No results for  input(s): "LIPASE", "AMYLASE" in the last 168 hours. Recent Labs  Lab 07/03/22 0323  AMMONIA 18   Coagulation Profile: No results for input(s): "INR", "PROTIME" in the last 168 hours. Cardiac Enzymes: No results for input(s): "CKTOTAL", "CKMB", "CKMBINDEX", "TROPONINI" in the last 168  hours. BNP (last 3 results) No results for input(s): "PROBNP" in the last 8760 hours. HbA1C: No results for input(s): "HGBA1C" in the last 72 hours. CBG: Recent Labs  Lab 07/01/22 2008  GLUCAP 109*   Lipid Profile: No results for input(s): "CHOL", "HDL", "LDLCALC", "TRIG", "CHOLHDL", "LDLDIRECT" in the last 72 hours. Thyroid Function Tests: No results for input(s): "TSH", "T4TOTAL", "FREET4", "T3FREE", "THYROIDAB" in the last 72 hours. Anemia Panel: No results for input(s): "VITAMINB12", "FOLATE", "FERRITIN", "TIBC", "IRON", "RETICCTPCT" in the last 72 hours. Sepsis Labs: No results for input(s): "PROCALCITON", "LATICACIDVEN" in the last 168 hours.  Recent Results (from the past 240 hour(s))  Surgical pcr screen     Status: Abnormal   Collection Time: 06/30/22  9:00 AM   Specimen: Nasal Mucosa; Nasal Swab  Result Value Ref Range Status   MRSA, PCR NEGATIVE NEGATIVE Final   Staphylococcus aureus POSITIVE (A) NEGATIVE Final    Comment: (NOTE) The Xpert SA Assay (FDA approved for NASAL specimens in patients 39 years of age and older), is one component of a comprehensive surveillance program. It is not intended to diagnose infection nor to guide or monitor treatment. Performed at [facility], [address] [place], [place] [address]     Radiology Studies: US Abdomen Limited RUQ (LIVER/GB)  Result Date: 07/06/2022 CLINICAL DATA:  Abnormal liver function tests EXAM: ULTRASOUND ABDOMEN LIMITED RIGHT UPPER QUADRANT COMPARISON:  12/18/2016 FINDINGS: Gallbladder: Gallstones: None Sludge: None Gallbladder Wall: Within normal limits Pericholecystic fluid: None Sonographic Murphy's Sign:  Negative per technologist Common bile duct: Diameter: 5 mm Liver: Parenchymal echogenicity: Within normal limits Contours: Normal Lesions: None Portal vein: Patent.  Hepatopetal flow Other: Possible right pleural effusion IMPRESSION: 1. No significant sonographic abnormality of the liver or gallbladder. 2. Hypoechogenicity seen superior to the right hemidiaphragm is suspicious for pleural effusion. Electronically Signed   By: Acquanetta Belling M.D.   On: 07/06/2022 13:52    Scheduled Meds:  amLODipine  10 mg Oral Daily   buPROPion  300 mg Oral Daily   busPIRone  5 mg Oral BID   cholecalciferol  2,000 Units Oral Daily   vitamin B-12  1,000 mcg Oral Daily   docusate sodium  100 mg Oral BID   enoxaparin (LOVENOX) injection  40 mg Subcutaneous Q24H   feeding supplement  237 mL Oral BID BM   FLUoxetine  20 mg Oral Daily   folic acid  1 mg Oral Daily   gabapentin  100 mg Oral BID   iron polysaccharides  150 mg Oral Daily   magnesium oxide  400 mg Oral BID   metoprolol succinate  25 mg Oral Daily   multivitamin with minerals  1 tablet Oral Daily   pantoprazole  40 mg Oral Daily   potassium chloride  10 mEq Oral Daily   thiamine  100 mg Oral Daily   Or   thiamine  100 mg Intravenous Daily   Continuous Infusions:   LOS: 6 days   Marguerita Merles, DO [facility] Available via Epic secure chat 7am-7pm After these hours, please refer to coverage provider listed on [email or web address] 07/06/2022, 2:20 PM

## 2022-07-06 NOTE — Progress Notes (Signed)
Physical Therapy Treatment Patient Details Name: Thomas Williamson MRN: [id number] DOB: Sep 18, 1975 Today's Date: 07/06/2022   History of Present Illness 47 y.o. male presenting 5/28 after a fall when tripping over his dog. He was found to have left acetabular fracture and hip location, is now s/p ORIF L acetabulum fracture dislocation with foot drop 06/30/22. PMH significant of ETOH use d/o, bipolar d/o, chronic systolic CHF, and HTN.    PT Comments    Making good progress. Cognition improving, recalls 2/3 hip precautions (posterior) and maintains TDWB on LLE throughout session with transfers and gait up to 55 feet today. HR in 120s with mobility. Reviewed LE exercises, precautions, and need for being OOB to prevent secondary complications due to immobility. Remains motivated and eager to get to rehab for additional training and return to PLOF. Patient will continue to benefit from skilled physical therapy services to further improve independence with functional mobility.    Recommendations for follow up therapy are one component of a multi-disciplinary discharge planning process, led by the attending physician.  Recommendations may be updated based on patient status, additional functional criteria and insurance authorization.  Follow Up Recommendations       Assistance Recommended at Discharge Frequent or constant Supervision/Assistance  Patient can return home with the following Direct supervision/assist for medications management;Direct supervision/assist for financial management;Assist for transportation;Help with stairs or ramp for entrance;A little help with walking and/or transfers;A little help with bathing/dressing/bathroom   Equipment Recommendations   (defer to post acute; RW vs Crutches)    Recommendations for Other Services Rehab consult     Precautions / Restrictions Precautions Precautions: Posterior Hip Precaution Booklet Issued: Yes (comment) Precaution Comments: Pt able to  recall 2/3 precautions Required Braces or Orthoses: Other Brace Other Brace: PRAFO Lt foot drop Restrictions Weight Bearing Restrictions: Yes LLE Weight Bearing: Touchdown weight bearing     Mobility  Bed Mobility Overal bed mobility: Needs Assistance Bed Mobility: Supine to Sit     Supine to sit: Min guard     General bed mobility comments: Min guard for safety, reminded of precautions with movement and to move slowly to prevent adduction of Lt hip.    Transfers Overall transfer level: Needs assistance Equipment used: Rolling walker (2 wheels) Transfers: Sit to/from Stand Sit to Stand: Min guard           General transfer comment: Min guard for safety. Cues for hand placement, good boost with UEs and RLE. Maintains TTWB on LLE. Performed safely from bed x2    Ambulation/Gait Ambulation/Gait assistance: Min guard Gait Distance (Feet): 55 Feet Assistive device: Rolling walker (2 wheels) Gait Pattern/deviations: Step-to pattern, Decreased dorsiflexion - left Gait velocity: decr Gait velocity interpretation: <1.31 ft/sec, indicative of household ambulator   General Gait Details: Minor adjustments made to RW for efficiency. Safely maintains TDWB on LLE throughout. Completed with extra time. Cues to avoid getting to close to front of walker with steps. Step-to pattern, utilizing proprioception through Lt foot as able, no DF present.   Stairs             Wheelchair Mobility    Modified Rankin (Stroke Patients Only)       Balance Overall balance assessment: Needs assistance Sitting-balance support: Feet supported Sitting balance-Leahy Scale: Good Sitting balance - Comments: sitting EOB   Standing balance support: During functional activity, Reliant on assistive device for balance, Single extremity supported Standing balance-Leahy Scale: Poor Standing balance comment: with RW support  Cognition Arousal/Alertness:  Awake/alert Behavior During Therapy: WFL for tasks assessed/performed Overall Cognitive Status: Impaired/Different from baseline Area of Impairment: Memory, Following commands, Safety/judgement, Problem solving                     Memory: Decreased recall of precautions Following Commands: Follows one step commands consistently Safety/Judgement: Decreased awareness of safety, Decreased awareness of deficits   Problem Solving: Requires verbal cues General Comments: Notable improvement. Recalls TDWB status and 2/3 precautions with cues        Exercises General Exercises - Lower Extremity Ankle Circles/Pumps: Strengthening, Both, 10 reps, Supine (unable to DF Lt ankle) Quad Sets: Strengthening, Both, 10 reps, Seated Gluteal Sets: Strengthening, Both, 10 reps, Seated Long Arc Quad: Strengthening, Left, 10 reps, Seated Heel Raises: Strengthening, Both, 10 reps, Seated    General Comments        Pertinent Vitals/Pain Pain Assessment Pain Assessment: Faces Faces Pain Scale: Hurts whole lot Pain Location: LLE and hip Pain Descriptors / Indicators: Aching, Jabbing, Pounding Pain Intervention(s): Limited activity within patient's tolerance, Monitored during session, Repositioned, Patient requesting pain meds-RN notified    Home Living                          Prior Function            PT Goals (current goals can now be found in the care plan section) Acute Rehab PT Goals Patient Stated Goal: return home to golf with his dad PT Goal Formulation: With patient Time For Goal Achievement: 07/16/22 Potential to Achieve Goals: Good Progress towards PT goals: Progressing toward goals    Frequency    Min 4X/week      PT Plan Current plan remains appropriate    Co-evaluation              AM-PAC PT "6 Clicks" Mobility   Outcome Measure  Help needed turning from your back to your side while in a flat bed without using bedrails?: A Little Help needed  moving from lying on your back to sitting on the side of a flat bed without using bedrails?: A Little Help needed moving to and from a bed to a chair (including a wheelchair)?: A Little Help needed standing up from a chair using your arms (e.g., wheelchair or bedside chair)?: A Little Help needed to walk in hospital room?: A Little Help needed climbing 3-5 steps with a railing? : Total 6 Click Score: 16    End of Session Equipment Utilized During Treatment: Gait belt Activity Tolerance: Patient tolerated treatment well Patient left: with call bell/phone within reach;in chair;with chair alarm set;with family/visitor present;with nursing/sitter in room Nurse Communication: Mobility status (Pt requesting pain medicine) PT Visit Diagnosis: Unsteadiness on feet (R26.81);Pain;Muscle weakness (generalized) (M62.81) Pain - Right/Left: Left Pain - part of body: Leg     Time: 1610-9604 PT Time Calculation (min) (ACUTE ONLY): 23 min  Charges:  $Gait Training: 8-22 mins $Therapeutic Exercise: 8-22 mins                     Kathlyn Sacramento, PT, DPT Physical Therapist Acute Rehabilitation Services [facility] [phone]    Berton Mount 07/06/2022, 10:52 AM

## 2022-07-06 NOTE — Progress Notes (Signed)
Verbal order to discontinue safety observation and cardiac monitoring by MD. Marland Mcalpine. Kateri Mc

## 2022-07-06 NOTE — Progress Notes (Signed)
Inpatient Rehab Admissions Coordinator:  Pt sitting in chair. Informed pt that beginning insurance authorization. Will continue to follow.  Wolfgang Phoenix, MS, CCC-SLP Admissions Coordinator [phone]

## 2022-07-06 NOTE — Progress Notes (Signed)
Occupational Therapy Treatment Patient Details Name: Thomas Williamson MRN: [id number] DOB: [birth year] Today's Date: 07/06/2022   History of present illness 47 y.o. male presenting 5/28 after a fall when tripping over his dog. He was found to have left acetabular fracture and hip location, is now s/p ORIF L acetabulum fracture dislocation with foot drop 06/30/22. PMH significant of ETOH use d/o, bipolar d/o, chronic systolic CHF, and HTN.   OT comments  Patient with good progress toward patient focused goals.  Able to progress to sit to stand ADL with hip kit this session.  Patient needing balance support and cues for hip precautions during functional task.  Overall moving with Min Guard to Min A for balance support, and up to Mod A for lower body ADL.  OT to continue efforts in the acute setting to address deficits, and Patient will benefit from intensive inpatient follow up therapy, >3 hours/day.    Recommendations for follow up therapy are one component of a multi-disciplinary discharge planning process, led by the attending physician.  Recommendations may be updated based on patient status, additional functional criteria and insurance authorization.    Assistance Recommended at Discharge Frequent or constant Supervision/Assistance  Patient can return home with the following  Assistance with cooking/housework;Assist for transportation;A little help with bathing/dressing/bathroom;A little help with walking and/or transfers;Direct supervision/assist for financial management;Direct supervision/assist for medications management;Help with stairs or ramp for entrance   Equipment Recommendations  BSC/3in1    Recommendations for Other Services      Precautions / Restrictions Precautions Precautions: Posterior Hip Precaution Booklet Issued: Yes (comment) Precaution Comments: Pt able to recall 2/3 precautions Required Braces or Orthoses: Other Brace Other Brace: PRAFO Lt foot drop Restrictions Weight  Bearing Restrictions: Yes LLE Weight Bearing: Touchdown weight bearing       Mobility Bed Mobility               General bed mobility comments: up in recliner    Transfers Overall transfer level: Needs assistance Equipment used: Rolling walker (2 wheels) Transfers: Sit to/from Stand Sit to Stand: Min guard                 Balance Overall balance assessment: Needs assistance Sitting-balance support: Feet supported Sitting balance-Leahy Scale: Good     Standing balance support: Reliant on assistive device for balance Standing balance-Leahy Scale: Poor                             ADL either performed or assessed with clinical judgement   ADL Overall ADL's : Needs assistance/impaired Eating/Feeding: Independent;Sitting   Grooming: Standing;Minimal assistance   Upper Body Bathing: Sitting;Independent   Lower Body Bathing: Moderate assistance;Sit to/from stand   Upper Body Dressing : Sitting;Independent   Lower Body Dressing: With adaptive equipment;Cueing for sequencing;Minimal assistance;Sit to/from stand;Moderate assistance;Cueing for compensatory techniques   Toilet Transfer: Ambulation;Minimal assistance;Rolling walker (2 wheels)                  Extremity/Trunk Assessment Upper Extremity Assessment Upper Extremity Assessment: Overall WFL for tasks assessed   Lower Extremity Assessment Lower Extremity Assessment: Defer to PT evaluation        Vision Baseline Vision/History: 1 Wears glasses Patient Visual Report: No change from baseline     Perception Perception Perception: Within Functional Limits   Praxis Praxis Praxis: Intact    Cognition Arousal/Alertness: Awake/alert Behavior During Therapy: Impulsive Overall Cognitive Status: Impaired/Different from baseline  Memory: Decreased recall of precautions Following Commands: Follows one step commands consistently Safety/Judgement:  Decreased awareness of safety, Decreased awareness of deficits   Problem Solving: Requires verbal cues                             Pertinent Vitals/ Pain       Pain Assessment Pain Assessment: Faces Faces Pain Scale: Hurts even more Pain Location: LLE and hip Pain Descriptors / Indicators: Tender, Aching, Pins and needles, Sore Pain Intervention(s): Monitored during session                                                          Frequency  Min 2X/week        Progress Toward Goals  OT Goals(current goals can now be found in the care plan section)  Progress towards OT goals: Progressing toward goals  Acute Rehab OT Goals OT Goal Formulation: With patient Time For Goal Achievement: 07/15/22 Potential to Achieve Goals: Good  Plan Frequency remains appropriate;Discharge plan remains appropriate    Co-evaluation                 AM-PAC OT "6 Clicks" Daily Activity     Outcome Measure   Help from another person eating meals?: None Help from another person taking care of personal grooming?: A Little Help from another person toileting, which includes using toliet, bedpan, or urinal?: A Little Help from another person bathing (including washing, rinsing, drying)?: A Lot Help from another person to put on and taking off regular upper body clothing?: None Help from another person to put on and taking off regular lower body clothing?: A Little 6 Click Score: 19    End of Session Equipment Utilized During Treatment: Gait belt;Rolling walker (2 wheels)  OT Visit Diagnosis: Unsteadiness on feet (R26.81);Pain;Other abnormalities of gait and mobility (R26.89) Pain - Right/Left: Left Pain - part of body: Hip;Leg   Activity Tolerance Patient tolerated treatment well   Patient Left in bed;with call bell/phone within reach;Other (comment);with family/visitor present;with bed alarm set   Nurse Communication Mobility status         Time: [phone] OT Time Calculation (min): 18 min  Charges: OT General Charges $OT Visit: 1 Visit OT Treatments $Self Care/Home Management : 8-22 mins  07/06/2022  RP, OTR/L  Acute Rehabilitation Services  Office:  [phone]   Suzanna Obey 07/06/2022, 12:27 PM

## 2022-07-07 ENCOUNTER — Other Ambulatory Visit: Payer: Self-pay | Admitting: Gastroenterology

## 2022-07-07 DIAGNOSIS — I426 Alcoholic cardiomyopathy: Secondary | ICD-10-CM | POA: Diagnosis not present

## 2022-07-07 DIAGNOSIS — Z8619 Personal history of other infectious and parasitic diseases: Secondary | ICD-10-CM

## 2022-07-07 DIAGNOSIS — F1010 Alcohol abuse, uncomplicated: Secondary | ICD-10-CM

## 2022-07-07 DIAGNOSIS — S32402B Unspecified fracture of left acetabulum, initial encounter for open fracture: Secondary | ICD-10-CM

## 2022-07-07 DIAGNOSIS — R7989 Other specified abnormal findings of blood chemistry: Secondary | ICD-10-CM | POA: Diagnosis not present

## 2022-07-07 DIAGNOSIS — F1029 Alcohol dependence with unspecified alcohol-induced disorder: Secondary | ICD-10-CM | POA: Diagnosis not present

## 2022-07-07 DIAGNOSIS — F419 Anxiety disorder, unspecified: Secondary | ICD-10-CM | POA: Diagnosis not present

## 2022-07-07 DIAGNOSIS — S32402A Unspecified fracture of left acetabulum, initial encounter for closed fracture: Secondary | ICD-10-CM | POA: Diagnosis not present

## 2022-07-07 LAB — CBC WITH DIFFERENTIAL/PLATELET
Abs Immature Granulocytes: 0.07 10*3/uL (ref 0.00–0.07)
Basophils Absolute: 0 10*3/uL (ref 0.0–0.1)
Basophils Relative: 0 %
Eosinophils Absolute: 0.1 10*3/uL (ref 0.0–0.5)
Eosinophils Relative: 1 %
HCT: 28.1 % — ABNORMAL LOW (ref 39.0–52.0)
Hemoglobin: 9.5 g/dL — ABNORMAL LOW (ref 13.0–17.0)
Immature Granulocytes: 1 %
Lymphocytes Relative: 21 %
Lymphs Abs: 1.6 10*3/uL (ref 0.7–4.0)
MCH: 32.2 pg (ref 26.0–34.0)
MCHC: 33.8 g/dL (ref 30.0–36.0)
MCV: 95.3 fL (ref 80.0–100.0)
Monocytes Absolute: 0.8 10*3/uL (ref 0.1–1.0)
Monocytes Relative: 11 %
Neutro Abs: 5.1 10*3/uL (ref 1.7–7.7)
Neutrophils Relative %: 66 %
Platelets: 411 10*3/uL — ABNORMAL HIGH (ref 150–400)
RBC: 2.95 MIL/uL — ABNORMAL LOW (ref 4.22–5.81)
RDW: 12.7 % (ref 11.5–15.5)
WBC: 7.7 10*3/uL (ref 4.0–10.5)
nRBC: 0 % (ref 0.0–0.2)

## 2022-07-07 LAB — MAGNESIUM: Magnesium: 1.9 mg/dL (ref 1.7–2.4)

## 2022-07-07 LAB — COMPREHENSIVE METABOLIC PANEL
ALT: 278 U/L — ABNORMAL HIGH (ref 0–44)
AST: 118 U/L — ABNORMAL HIGH (ref 15–41)
Albumin: 3.3 g/dL — ABNORMAL LOW (ref 3.5–5.0)
Alkaline Phosphatase: 255 U/L — ABNORMAL HIGH (ref 38–126)
Anion gap: 11 (ref 5–15)
BUN: 10 mg/dL (ref 6–20)
CO2: 22 mmol/L (ref 22–32)
Calcium: 9.5 mg/dL (ref 8.9–10.3)
Chloride: 101 mmol/L (ref 98–111)
Creatinine, Ser: 0.99 mg/dL (ref 0.61–1.24)
GFR, Estimated: >60 mL/min (ref >60)
Glucose, Bld: 134 mg/dL — ABNORMAL HIGH (ref 70–99)
Potassium: 4.2 mmol/L (ref 3.5–5.1)
Sodium: 134 mmol/L — ABNORMAL LOW (ref 135–145)
Total Bilirubin: 0.9 mg/dL (ref 0.3–1.2)
Total Protein: 6.9 g/dL (ref 6.5–8.1)

## 2022-07-07 LAB — HEPATITIS PANEL, ACUTE
HCV Ab: REACTIVE — AB
Hep A IgM: NONREACTIVE
Hep B C IgM: NONREACTIVE
Hepatitis B Surface Ag: NONREACTIVE

## 2022-07-07 LAB — TESTOSTERONE, FREE: Testosterone, Free: 2 pg/mL — ABNORMAL LOW (ref 6.8–21.5)

## 2022-07-07 LAB — PHOSPHORUS: Phosphorus: 4.4 mg/dL (ref 2.5–4.6)

## 2022-07-07 NOTE — Progress Notes (Signed)
  Inpatient Rehabilitation Admissions Coordinator   I have insurance approval for CIR , but no bed available to admit today. I met at bedside with patient and PT. I will follow up tomorrow to verify bed availability and to determine his progress for CIR admit vs Home with [facility] if continues to progress well.  Ottie Glazier, RN, MSN Rehab Admissions Coordinator [phone] 07/07/2022 11:39 AM

## 2022-07-07 NOTE — Progress Notes (Signed)
Physical Therapy Treatment Patient Details Name: Thomas Williamson MRN: [id number] DOB: [birth year] Today's Date: 07/07/2022   History of Present Illness 47 y.o. male presenting 5/28 after a fall when tripping over his dog. He was found to have left acetabular fracture and hip location, is now s/p ORIF L acetabulum fracture dislocation with foot drop 06/30/22. PMH significant of ETOH use d/o, bipolar d/o, chronic systolic CHF, and HTN.    PT Comments    Continues to work diligently with acute rehab towards functional goals. Cognition greatly improved, only needing minor cues for problem solving but suspect nearing baseline at this point. Pt requested to practice mobilizing with crutches as he has used them for a prolonged period in the past. Required min assist with this AD and was quite anxious. More confident with RW and mobilized up to 85 with min guard using RW today (UEs fatigue.) Safely maintains TDWB with transfers and gait. Reviewed awareness of posterior hip precautions with functional tasks and repositioning. Eager to work with PT and return to [employer]. Very motivated. Still no sign of ankle dorsiflexion; 3-/5 ankle inversion, trace eversion; + Paresthesias L5-S1 dermatome.   Recommendations for follow up therapy are one component of a multi-disciplinary discharge planning process, led by the attending physician.  Recommendations may be updated based on patient status, additional functional criteria and insurance authorization.     Assistance Recommended at Discharge Frequent or constant Supervision/Assistance  Patient can return home with the following Direct supervision/assist for medications management;Direct supervision/assist for financial management;Assist for transportation;Help with stairs or ramp for entrance;A little help with walking and/or transfers;A little help with bathing/dressing/bathroom   Equipment Recommendations   (defer to post acute; RW vs Crutches)    Recommendations for  Other Services Rehab consult     Precautions / Restrictions Precautions Precautions: Posterior Hip Precaution Booklet Issued: Yes (comment) Precaution Comments: Pt able to recall 3/3 precautions Required Braces or Orthoses: Other Brace Other Brace: PRAFO Lt foot drop Restrictions Weight Bearing Restrictions: Yes LLE Weight Bearing: Touchdown weight bearing     Mobility  Bed Mobility Overal bed mobility: Needs Assistance Bed Mobility: Supine to Sit     Supine to sit: Supervision     General bed mobility comments: Supervision for safety, cues for hip precautions while getting out of bed.    Transfers Overall transfer level: Needs assistance Equipment used: Rolling walker (2 wheels), Crutches Transfers: Sit to/from Stand Sit to Stand: Min guard           General transfer comment: Min guard for safety with crutches and walker. Mild instability but showing improved control with transition and maintains TDWB on LLE.    Ambulation/Gait Ambulation/Gait assistance: Min guard, Min assist Gait Distance (Feet): 15 Feet (85' with RW) Assistive device: Rolling walker (2 wheels), Crutches Gait Pattern/deviations: Step-to pattern, Decreased dorsiflexion - left Gait velocity: decr Gait velocity interpretation: <1.31 ft/sec, indicative of household ambulator   General Gait Details: Min assist for balance, heavy VC for seqeuncing and safety with crutches. Pt quite anxious with this device (requested to practice since he has used them in the past.) Greater confidence and control with RW however UEs fatigue quickly for age and pt still subjectively feels unsteady. Maintains TDWB on Lt, still with notable foot drop and no active motion, compensating well though. Cues for walker proximity.   Stairs             Wheelchair Mobility    Modified Rankin (Stroke Patients Only)  Balance Overall balance assessment: Needs assistance Sitting-balance support: Feet  supported Sitting balance-Leahy Scale: Good Sitting balance - Comments: sitting EOB   Standing balance support: Reliant on assistive device for balance, Single extremity supported Standing balance-Leahy Scale: Poor Standing balance comment: Single UE support required.                            Cognition Arousal/Alertness: Awake/alert Behavior During Therapy: Anxious Overall Cognitive Status: Impaired/Different from baseline Area of Impairment: Problem solving                 Orientation Level: Disoriented to, Place           Problem Solving: Requires verbal cues General Comments: Great improvement, still needs intermittent cues directions with mobility and tasks but likely near baseline now.        Exercises General Exercises - Lower Extremity Ankle Circles/Pumps: Strengthening, Both, 10 reps, Supine, PROM (unable to DF Lt ankle; used belt) Quad Sets: Strengthening, Both, 10 reps, Seated Gluteal Sets: Strengthening, Both, 10 reps, Seated Long Arc Quad: Strengthening, Left, Seated, 5 reps Straight Leg Raises: AAROM, Left, 5 reps, Strengthening, Supine Heel Raises: Strengthening, Both, 10 reps, Seated    General Comments General comments (skin integrity, edema, etc.): Reviewed precautions and awareness of hip position with bed mobility and repositioning. Utilization of pillows for comfort and alignment.      Pertinent Vitals/Pain Pain Assessment Pain Assessment: Faces Faces Pain Scale: Hurts even more Pain Location: LLE and hip Pain Descriptors / Indicators: Tender, Aching, Pins and needles, Sore Pain Intervention(s): Monitored during session, Repositioned    Home Living                          Prior Function            PT Goals (current goals can now be found in the care plan section) Acute Rehab PT Goals Patient Stated Goal: return home to golf with his dad after CIR PT Goal Formulation: With patient Time For Goal Achievement:  07/16/22 Potential to Achieve Goals: Good Progress towards PT goals: Progressing toward goals    Frequency    Min 4X/week      PT Plan Current plan remains appropriate    Co-evaluation              AM-PAC PT "6 Clicks" Mobility   Outcome Measure  Help needed turning from your back to your side while in a flat bed without using bedrails?: A Little Help needed moving from lying on your back to sitting on the side of a flat bed without using bedrails?: A Little Help needed moving to and from a bed to a chair (including a wheelchair)?: A Little Help needed standing up from a chair using your arms (e.g., wheelchair or bedside chair)?: A Little Help needed to walk in hospital room?: A Little Help needed climbing 3-5 steps with a railing? : Total 6 Click Score: 16    End of Session Equipment Utilized During Treatment: Gait belt Activity Tolerance: Patient tolerated treatment well Patient left: with call bell/phone within reach;in chair;with chair alarm set Nurse Communication: Mobility status PT Visit Diagnosis: Unsteadiness on feet (R26.81);Pain;Muscle weakness (generalized) (M62.81) Pain - Right/Left: Left Pain - part of body: Leg     Time: [id number] PT Time Calculation (min) (ACUTE ONLY): 37 min  Charges:  $Gait Training: 8-22 mins $Therapeutic Activity: 8-22 mins  Kathlyn Sacramento, PT, DPT [facility]  Rehabilitation Services Physical Therapist Office: [phone] Website: [email or web address]    Berton Mount 07/07/2022, 12:58 PM

## 2022-07-07 NOTE — Consult Note (Addendum)
Consultation  Referring Provider:  [facility]  Primary Care Physician:  Nelwyn Salisbury, MD Primary Gastroenterologist:  Dr. Adela Lank       Reason for Consultation:     Elevated LFTs  LOS: 7 days          HPI:   Thomas Williamson is a 47 y.o. male with past medical history significant for alcohol use disorder, bipolar disorder, chronic systolic CHF, hypertension, recent fall with left acetabular fracture and hip dislocation s/p surgical intervention 06/30/22 presents for evaluation of LFTs.  Patient presented to [facility] ED and transferred to [facility] after tripping over his dog and falling down a flight of stairs sustaining a left acetabular and hip dislocation. Underwent surgical intervention 06/30/22.  GI was consulted for elevated LFTs. LFTs were normal 6/2.  6/3 -- AST 630/ ALT 302/ Alk phos 196. T bili 1.0 6/4--  AST 118/ ALT 278/ Alk phos 255. T bili 0.9 Hgb 9.5, stable Platelets 411 Heb B surface antigen nonreactive Hep A IgM nonreactive Hep B C IgM non reactive HCV ab pending  Patient reports drinking 1/5 of whiskey per day for many years. Has had multiple hospital admissions [facility]) for detox and has been in rehab a few times. Denies previous seizures with detox episodes. Reports he is trying to quit drinking and currently only drinks "a few drinks" per day. Denies NSAIDs. Denies melena/hematochezia. Reports constipation since surgery though was able to have a BM this morning. Denies abdominal pain, nausea, and vomiting.  Colonoscopy 03/2019 with Dr. Adela Lank for history of polyps and father with colon cancer: 4mm tubular adenoma in cecum, 10mm tubular adenoma in cecum, diverticulosis in ascending colon, Four 3-72mm tubular adenoma in ascending colon, four 3-25mm tubular adenomas in transverse colon, one 3mm tubular adenomas in descending, internal hemorrhoids.  Past Medical History:  Diagnosis Date   Acetabulum fracture, left (HCC) 06/30/2022   Alcohol use disorder, severe, in  early remission (HCC) 03/30/2018   Allergy    Anxiety    Bipolar disorder, in partial remission, most recent episode hypomanic (HCC) 03/30/2018   se[facility] Dr. [facility]a [facility]   Bleeding internal hemorrhoids 09/12/2013   Cannabis dependence (HCC) 07/13/2018   Daily use   Cervical disc disease 11/20/2014   Chronic anxiety 07/13/2018   Chronic systolic (congestive) heart failure (HCC)    Complication of anesthesia    Depression    Dyslipidemia    GERD (gastroesophageal reflux disease)    Hernia, inguinal, bilateral 02/24/2011   High ankle sprain of right lower extremity 01/16/2015   History of hiatal hernia    Hx of hepatitis C 10/05/2017   -treated in 2019 -hepatology recommended no further surveillance needed except for LFTs with labs and see hepatologist if elevated   Hypertension    Lipids abnormal 09/29/2013   Pneumonia    PONV (postoperative nausea and vomiting)    Vitamin D deficiency 07/01/2022    Surgical History:  He  has a past surgical history that includes Anterior cruciate ligament repair (2010); Nose surgery (1610,9604); Inguinal hernia repair (02/23/2012); Insertion of mesh (02/23/2012); Colonoscopy (N/A, 03/13/2019); polypectomy (03/13/2019); biopsy (03/13/2019); and ORIF acetabular fracture (Left, 06/30/2022). Family History:  His family history includes Atrial fibrillation in his mother; Cancer in his father; Congestive Heart Failure in his paternal grandmother; Diabetes in his brother and father; Heart attack in his maternal grandfather; Hypertension in his father and mother; Learning disabilities in his paternal aunt; Leukemia (age of onset: 47) in  his father; Lung cancer in his maternal grandmother; Other in his brother; Prostate cancer (age of onset: 46) in his father. Social History:   reports that he has been smoking e-cigarettes. He has never used smokeless tobacco. He reports current alcohol use of about 4.0 standard drinks of alcohol per week. He  reports current drug use. Frequency: 2.00 times per week. Drug: Marijuana.  Prior to Admission medications   Medication Sig Start Date End Date Taking? Authorizing Provider  amLODipine (NORVASC) 10 MG tablet Take 1 tablet (10 mg total) by mouth daily. 03/31/22  Yes Nelwyn Salisbury, MD  buPROPion (WELLBUTRIN XL) 300 MG 24 hr tablet Take 1 tablet (300 mg total) by mouth daily. 06/25/20  Yes Ravi, Himabindu, MD  busPIRone (BUSPAR) 5 MG tablet Take 1 tablet (5 mg total) by mouth 2 (two) times daily. 06/18/22  Yes Nelwyn Salisbury, MD  FLUoxetine (PROZAC) 20 MG capsule Take 20 mg by mouth daily. 02/12/22  Yes [provider]  folic acid (FOLVITE) 1 MG tablet Take 1 tablet (1 mg total) by mouth daily. 02/19/22 02/14/23 Yes Nelwyn Salisbury, MD  gabapentin (NEURONTIN) 100 MG capsule Take 100 mg by mouth 2 (two) times daily. 12/18/21  Yes [provider]  LORazepam (ATIVAN) 1 MG tablet Take 1 tablet (1 mg total) by mouth every 8 (eight) hours as needed for anxiety. 06/18/22  Yes Nelwyn Salisbury, MD  losartan-hydrochlorothiazide (HYZAAR) 100-25 MG tablet TAKE 1 TABLET BY MOUTH EVERY DAY 06/22/22  Yes Nelwyn Salisbury, MD  Magnesium Oxide 400 MG CAPS Take 1 capsule (400 mg total) by mouth 2 (two) times daily. 02/18/22  Yes Nelwyn Salisbury, MD  metoprolol succinate (TOPROL-XL) 25 MG 24 hr tablet Take 1 tablet (25 mg total) by mouth daily. 02/23/22 08/22/22 Yes Nelwyn Salisbury, MD  naltrexone (DEPADE) 50 MG tablet Take 50 mg by mouth daily. 04/27/22  Yes [provider]  Omeprazole 20 MG TBEC Take 20 mg by mouth daily.    Yes [provider]  potassium chloride (KLOR-CON M) 10 MEQ tablet Take 1 tablet (10 mEq total) by mouth daily. 01/07/22  Yes Nelwyn Salisbury, MD  pravastatin (PRAVACHOL) 40 MG tablet TAKE 1 TABLET BY MOUTH EVERY DAY 06/22/22  Yes Nelwyn Salisbury, MD    Current Facility-Administered Medications  Medication Dose Route Frequency Provider Last Rate Last Admin   amLODipine (NORVASC)  tablet 10 mg  10 mg Oral Daily Jonah Blue, MD   10 mg at 07/07/22 1610   bisacodyl (DULCOLAX) EC tablet 5 mg  5 mg Oral Daily PRN Montez Morita, PA-C   5 mg at 07/07/22 9604   buPROPion (WELLBUTRIN XL) 24 hr tablet 300 mg  300 mg Oral Daily Jonah Blue, MD   300 mg at 07/07/22 5409   busPIRone (BUSPAR) tablet 5 mg  5 mg Oral BID Montez Morita, PA-C   5 mg at 07/07/22 8119   cholecalciferol (VITAMIN D3) 25 MCG (1000 UNIT) tablet 2,000 Units  2,000 Units Oral Daily Marguerita Merles [place], DO   2,000 Units at 07/07/22 1478   cyanocobalamin (VITAMIN B12) tablet 1,000 mcg  1,000 mcg Oral Daily Marguerita Merles [place], DO   1,000 mcg at 07/07/22 2956   docusate sodium (COLACE) capsule 100 mg  100 mg Oral BID Montez Morita, PA-C   100 mg at 07/07/22 0833   enoxaparin (LOVENOX) injection 40 mg  40 mg Subcutaneous Q24H Montez Morita, PA-C   40 mg at 07/07/22 [phone]  feeding supplement (ENSURE ENLIVE / ENSURE PLUS) liquid 237 mL  237 mL Oral BID BM Marguerita Merles [place], DO   237 mL at 07/06/22 0737   FLUoxetine (PROZAC) capsule 20 mg  20 mg Oral Daily Jonah Blue, MD   20 mg at 07/07/22 1062   folic acid (FOLVITE) tablet 1 mg  1 mg Oral Daily Montez Morita, PA-C   1 mg at 07/07/22 6948   gabapentin (NEURONTIN) capsule 100 mg  100 mg Oral BID Montez Morita, PA-C   100 mg at 07/07/22 5462   iron polysaccharides (NIFEREX) capsule 150 mg  150 mg Oral Daily Marguerita Merles [place], DO   150 mg at 07/07/22 7035   magnesium oxide (MAG-OX) tablet 400 mg  400 mg Oral BID Jonah Blue, MD   400 mg at 07/07/22 0093   melatonin tablet 5 mg  5 mg Oral QHS PRN Anthoney Harada, NP   5 mg at 07/04/22 2124   methocarbamol (ROBAXIN) tablet 1,000 mg  1,000 mg Oral Q6H PRN Marguerita Merles [place], DO   1,000 mg at 07/07/22 1040   metoCLOPramide (REGLAN) tablet 5-10 mg  5-10 mg Oral Q8H PRN Montez Morita, PA-C       Or   metoCLOPramide (REGLAN) injection 5-10 mg  5-10 mg Intravenous Q8H PRN Montez Morita, PA-C       metoprolol succinate  (TOPROL-XL) 24 hr tablet 25 mg  25 mg Oral Daily Jonah Blue, MD   25 mg at 07/07/22 8182   morphine (PF) 2 MG/ML injection 2 mg  2 mg Intravenous Q4H PRN Marguerita Merles [place], DO       multivitamin with minerals tablet 1 tablet  1 tablet Oral Daily Montez Morita, PA-C   1 tablet at 07/07/22 0833   ondansetron (ZOFRAN) injection 4 mg  4 mg Intravenous Q6H PRN Montez Morita, PA-C   4 mg at 07/02/22 0515   ondansetron (ZOFRAN) tablet 4 mg  4 mg Oral Q6H PRN Montez Morita, PA-C   4 mg at 07/06/22 2014   oxyCODONE (Oxy IR/ROXICODONE) immediate release tablet 5 mg  5 mg Oral Q6H PRN Marguerita Merles Latif, DO   5 mg at 07/07/22 1040   pantoprazole (PROTONIX) EC tablet 40 mg  40 mg Oral Daily Jonah Blue, MD   40 mg at 07/07/22 0929   polyethylene glycol (MIRALAX / GLYCOLAX) packet 17 g  17 g Oral Daily PRN Montez Morita, PA-C   17 g at 07/06/22 0849   potassium chloride (KLOR-CON M) CR tablet 10 mEq  10 mEq Oral Daily Jonah Blue, MD   10 mEq at 07/07/22 9937   thiamine (VITAMIN B1) tablet 100 mg  100 mg Oral Daily Montez Morita, PA-C   100 mg at 07/07/22 1696   Or   thiamine (VITAMIN B1) injection 100 mg  100 mg Intravenous Daily Montez Morita, PA-C        Allergies as of 06/30/2022 - Review Complete 06/30/2022  Allergen Reaction Noted   Hydrocodone  02/19/2012    Review of Systems  Constitutional:  Negative for chills, fever and weight loss.  HENT:  Negative for hearing loss and tinnitus.   Eyes:  Negative for blurred vision and double vision.  Respiratory:  Negative for cough and hemoptysis.   Cardiovascular:  Negative for chest pain and orthopnea.  Gastrointestinal:  Negative for abdominal pain, blood in stool, constipation, diarrhea, heartburn, melena, nausea and vomiting.  Genitourinary:  Negative for dysuria and urgency.  Musculoskeletal:  Positive for falls.  Negative for myalgias and neck pain.  Skin:  Negative for itching and rash.  Neurological:  Negative for seizures and loss of  consciousness.  Psychiatric/Behavioral:  Negative for depression and suicidal ideas.        Physical Exam:  Vital signs in last 24 hours: Temp:  [97.6 F (36.4 C)-98.8 F (37.1 C)] 97.6 F (36.4 C) (06/04 0802) Pulse Rate:  [87-95] 94 (06/04 0802) Resp:  [16-19] 16 (06/04 0802) BP: (106-120)/(79-96) 106/84 (06/04 0802) SpO2:  [100 %] 100 % (06/04 0802) Last BM Date : 07/04/22 Last BM recorded by nurses in past 5 days Stool Type: Type 4 (Like a smooth, soft sausage or snake) (07/07/2022  3:45 AM)  Physical Exam Constitutional:      Appearance: Normal appearance. He is not ill-appearing.  HENT:     Nose: Nose normal. No congestion.     Mouth/Throat:     Mouth: Mucous membranes are moist.     Pharynx: No oropharyngeal exudate.  Eyes:     Extraocular Movements: Extraocular movements intact.     Conjunctiva/sclera: Conjunctivae normal.  Cardiovascular:     Rate and Rhythm: Normal rate and regular rhythm.  Pulmonary:     Effort: Pulmonary effort is normal. No respiratory distress.  Abdominal:     General: Abdomen is flat. Bowel sounds are normal. There is no distension.     Palpations: There is no mass.  Musculoskeletal:        General: No swelling. Normal range of motion.     Cervical back: Normal range of motion and neck supple.  Skin:    General: Skin is warm and dry.     Coloration: Skin is not jaundiced.  Neurological:     General: No focal deficit present.     Mental Status: He is oriented to person, place, and time.  Psychiatric:        Mood and Affect: Mood normal.        Behavior: Behavior normal.        Thought Content: Thought content normal.        Judgment: Judgment normal.      LAB RESULTS: Recent Labs    07/05/22 0143 07/06/22 0948 07/07/22 0835  WBC 7.7 8.1 7.7  HGB 7.8* 9.1* 9.5*  HCT 23.7* 26.9* 28.1*  PLT 333 379 411*   BMET Recent Labs    07/05/22 0143 07/06/22 0948 07/07/22 0835  NA 135 136 134*  K 3.5 4.1 4.2  CL 103 102 101  CO2  23 23 22   GLUCOSE 129* 130* 134*  BUN 16 12 10   CREATININE 0.93 0.78 0.99  CALCIUM 9.0 9.3 9.5   LFT Recent Labs    07/07/22 0835  PROT 6.9  ALBUMIN 3.3*  AST 118*  ALT 278*  ALKPHOS 255*  BILITOT 0.9   PT/INR No results for input(s): "LABPROT", "INR" in the last 72 hours.  STUDIES: US Abdomen Limited RUQ (LIVER/GB)  Result Date: 07/06/2022 CLINICAL DATA:  Abnormal liver function tests EXAM: ULTRASOUND ABDOMEN LIMITED RIGHT UPPER QUADRANT COMPARISON:  12/18/2016 FINDINGS: Gallbladder: Gallstones: None Sludge: None Gallbladder Wall: Within normal limits Pericholecystic fluid: None Sonographic Murphy's Sign: Negative per technologist Common bile duct: Diameter: 5 mm Liver: Parenchymal echogenicity: Within normal limits Contours: Normal Lesions: None Portal vein: Patent.  Hepatopetal flow Other: Possible right pleural effusion IMPRESSION: 1. No significant sonographic abnormality of the liver or gallbladder. 2. Hypoechogenicity seen superior to the right hemidiaphragm is suspicious for pleural effusion. Electronically Signed  By: Mauri Reading  Mir M.D.   On: 07/06/2022 13:52      Impression    Elevated LFTs 6/3 -- AST 630/ ALT 302/ Alk phos 196. T bili 1.0 6/4--  AST 118/ ALT 278/ Alk phos 255. T bili 0.9 Hgb 9.5, stable Platelets 411 Heb B surface antigen nonreactive Hep A IgM nonreactive Hep B C IgM non reactive HCV ab  RUQ [name]: no significant abnormality of liver or gallbladder. Suspect elevated LFTs secondary to shock liver versus drug-induced injury. Most of hepatitis panel is negative. LFTs are trending down.  Acetabulum fracture - s/p surgical intervention   Plan   - LFTs trending down. Will continue to follow LFTs to normalization as an outpatient - Check PT/INR - Counseled on importance of cessation of alcohol use. - GI will follow peripherally versus sign off and follow as an outpatient. See discharge for follow up lab appt and follow up OV.  Thank you for your kind  consultation, we will continue to follow.  Bayley Leanna Sato  07/07/2022, 11:59 AM    Attending physician's note   I have taken a history, reviewed the chart and examined the patient. I performed a substantive portion of this encounter, including complete performance of at least one of the key components, in conjunction with the APP. I agree with the APP's note, impression and recommendations.   LFT are trending down, most likely etiology is drug-induced liver injury versus mild hepatic ischemic injury in the perioperative. Will continue to monitor.  He has history of hep C s/p treatment with SVR. Chronic alcohol abuse, discussed alcohol cessation  Follow-up PT and INR  The patient was provided an opportunity to ask questions and all were answered. The patient agreed with the plan and demonstrated an understanding of the instructions.   Iona Beard , MD [phone]

## 2022-07-07 NOTE — Progress Notes (Signed)
Orthopedic Tech Progress Note Patient Details:  Jarrad Kilcullen Feb 08, 1975 [id number]  Called in stat order to [facility] for an AFO   Patient ID: Thomas Williamson, male   DOB: 03-16-1975, 47 y.o.   MRN: [id number]  [name] 07/07/2022, 12:50 PM

## 2022-07-07 NOTE — Progress Notes (Signed)
Orthopaedic Trauma Service Progress Note  Patient ID: Thomas Williamson MRN: [id number] DOB/AGE: 47-Sep-1977 40 y.o.  Subjective:  Doing much better Pain controlled Approved for CIR but waiting for bed   ROS As above  Objective:   VITALS:   Vitals:   07/06/22 1947 07/07/22 0423 07/07/22 0802 07/07/22 1416  BP: 120/89 (!) 113/96 106/84 118/82  Pulse: 89 95 94 88  Resp: 18 19 16 16   Temp: 98.5 F (36.9 C)  97.6 F (36.4 C) 98.5 F (36.9 C)  TempSrc: Oral   Oral  SpO2: 100% 100% 100% 100%  Weight:      Height:        Estimated body mass index is 25.62 kg/m as calculated from the following:   Height as of this encounter: 5\' 9"  (1.753 m).   Weight as of this encounter: 78.7 kg.   Intake/Output      06/03 0701 06/04 0700 06/04 0701 06/05 0700   P.O.  360   Total Intake(mL/kg)  360 (4.6)   Urine (mL/kg/hr) 1000 (0.5) 500 (0.8)   Stool  0   Total Output 1000 500   Net -1000 -140        Stool Occurrence  1 x     LABS  Results for orders placed or performed during the hospital encounter of 06/30/22 (from the past 24 hour(s))  Hepatitis panel, acute     Status: Abnormal   Collection Time: 07/07/22  8:35 AM  Result Value Ref Range   Hepatitis B Surface Ag NON REACTIVE NON REACTIVE   HCV Ab Reactive (A) NON REACTIVE   Hep A IgM NON REACTIVE NON REACTIVE   Hep B C IgM NON REACTIVE NON REACTIVE  CBC with Differential/Platelet     Status: Abnormal   Collection Time: 07/07/22  8:35 AM  Result Value Ref Range   WBC 7.7 4.0 - 10.5 K/uL   RBC 2.95 (L) 4.22 - 5.81 MIL/uL   Hemoglobin 9.5 (L) 13.0 - 17.0 g/dL   HCT 19.1 (L) 47.8 - 29.5 %   MCV 95.3 80.0 - 100.0 fL   MCH 32.2 26.0 - 34.0 pg   MCHC 33.8 30.0 - 36.0 g/dL   RDW 62.1 30.8 - 65.7 %   Platelets 411 (H) 150 - 400 K/uL   nRBC 0.0 0.0 - 0.2 %   Neutrophils Relative % 66 %   Neutro Abs 5.1 1.7 - 7.7 K/uL   Lymphocytes Relative 21 %    Lymphs Abs 1.6 0.7 - 4.0 K/uL   Monocytes Relative 11 %   Monocytes Absolute 0.8 0.1 - 1.0 K/uL   Eosinophils Relative 1 %   Eosinophils Absolute 0.1 0.0 - 0.5 K/uL   Basophils Relative 0 %   Basophils Absolute 0.0 0.0 - 0.1 K/uL   Immature Granulocytes 1 %   Abs Immature Granulocytes 0.07 0.00 - 0.07 K/uL  Comprehensive metabolic panel     Status: Abnormal   Collection Time: 07/07/22  8:35 AM  Result Value Ref Range   Sodium 134 (L) 135 - 145 mmol/L   Potassium 4.2 3.5 - 5.1 mmol/L   Chloride 101 98 - 111 mmol/L   CO2 22 22 - 32 mmol/L   Glucose, Bld 134 (H) 70 - 99 mg/dL   BUN 10 6 - 20 mg/dL  Creatinine, Ser 0.99 0.61 - 1.24 mg/dL   Calcium 9.5 8.9 - 16.1 mg/dL   Total Protein 6.9 6.5 - 8.1 g/dL   Albumin 3.3 (L) 3.5 - 5.0 g/dL   AST 096 (H) 15 - 41 U/L   ALT 278 (H) 0 - 44 U/L   Alkaline Phosphatase 255 (H) 38 - 126 U/L   Total Bilirubin 0.9 0.3 - 1.2 mg/dL   GFR, Estimated >04 >54 mL/min   Anion gap 11 5 - 15  Phosphorus     Status: None   Collection Time: 07/07/22  8:35 AM  Result Value Ref Range   Phosphorus 4.4 2.5 - 4.6 mg/dL  Magnesium     Status: None   Collection Time: 07/07/22  8:35 AM  Result Value Ref Range   Magnesium 1.9 1.7 - 2.4 mg/dL     PHYSICAL EXAM:   Gen: in chair, watching tablet, mentation seems clear  Lungs: unlabored  Cardiac: reg Abd: NT, + BS  Ext:       Left Lower Extremity              incision clean and dry              PRAFO boot is on             Ext warm              + DP pulse             No DCT             Compartments are soft             SPN, TN sensation intact             Diminished DPN sensation             no EHL or ankle extension noted             Ankle and toe flexion intact             + Quad set  Assessment/Plan: 7 Days Post-Op     Anti-infectives (From admission, onward)    Start     Dose/Rate Route Frequency Ordered Stop   06/30/22 2100  ceFAZolin (ANCEF) IVPB 2g/100 mL premix        2 g 200 mL/hr  over 30 Minutes Intravenous Every 8 hours 06/30/22 1642 07/01/22 1547   06/30/22 1100  ceFAZolin (ANCEF) IVPB 2g/100 mL premix        2 g 200 mL/hr over 30 Minutes Intravenous On call to O.R. 06/30/22 1005 06/30/22 1300   06/30/22 1008  ceFAZolin (ANCEF) 2-4 GM/100ML-% IVPB       Note to Pharmacy: Jamelle Rushing, GRETA: cabinet override      06/30/22 1008 06/30/22 1315     .  POD/HD#: 81  47 year old male s/p with left acetabulum fracture dislocation   -Closed left transverse posterior wall acetabular fracture dislocation with foot drop s/p ORIF L acetabulum Weightbearing Touchdown weightbearing left leg using crutches or walker.  Will advance weightbearing in about 8 weeks               ROM/Activity                         Posterior hip precautions for 12 weeks.  Activity as tolerated while maintaining weightbearing restrictions and range of motion precautions.  Wound care                         ok to leave wound open to air as pt continues to pick dressing off                         Ok to clean with soap and water   PT and OT   XRT for heterotopic ossification prophylaxis has been completed    I placed order for [facility] to start the process for AFO for his foot drop.  We discussed potential long recovery process for this.   - Pain management:             Multimodal   - ABL anemia/Hemodynamics             monitor             stable   H/h trending up    - Medical issues              Per primary   - DVT/PE prophylaxis:             Lovenox and SCDs while inpatient             Discharged on Eliquis 2.5 mg p.o. 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  Labs show vitamin D deficiency.  Supplement.  Will also check a testosterone panel in the setting of chronic alcoholism and marijuana use   - Activity:             As above   -  Impediments to fracture healing:             Chronic alcohol use             Nicotine dependence             Regular marijuana use             Vitamin D deficiency   - Dispo:             Ortho issues stable Sutures out in another week ( ~6/11)   Mearl Latin, PA-C [phone] (C) 07/07/2022, 2:32 PM  [facility] [address] [place] [address] [phone] [phone] (F)    After 5pm and on the weekends please log on to Amion, go to orthopaedics and the look under the [facility] Call for the provider(s) on call. You can also call our office at [phone] and then follow the prompts to be connected to the call team.  Patient ID: Michel Harrow, male   DOB: 05/16/1975, 47 y.o.   MRN: [id number]

## 2022-07-07 NOTE — Progress Notes (Signed)
PROGRESS NOTE    Thomas Williamson  ZOX:[id number] DOB: 1975-12-30 DOA: 06/30/2022 PCP: Nelwyn Salisbury, MD   Brief Narrative:  The patient is a 47 year old Caucasian male with past medical history significant for but limited to alcohol use disorder, bipolar disorder, chronic systolic CHF, hypertension as well as other comorbidities who presented with a fall.  Reports that he was coming down the stairs with his dog and the dog stopped and the patient pivoted and he tripped over the dog and fell down the rest of the stairs.  He says that he had pain in the hip and overall send notes that he "drank a double" last night prior to fall and feels that it did not contribute to the fall.  He recently returned from rehab for his alcoholism and only drinks a few drinks daily now.  He was at the [facility] ED and transferred to [facility] and was found to have a left acetabular fracture and hip dislocation.  Orthopedic surgery evaluated and took the patient for surgical intervention on 06/30/2022.   **Interim History  Patient is Postoperative day 6. Pain regimen has been initiated but may have been too much so adjusted.  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  PT OT to further evaluate and treat and they are recommending CIR and he may be a candidate  Patient appeared more confused postoperatively and could be in the setting of narcotics given that he was getting 15 mg of oxycodone IR every 4 hours as needed but appears to be that he is taking it regularly.  Have adjusted medications and obtained a head CT scan and placed him on delirium precautions.  CT head was normal.  Patient was much more awake and alert yesterday a.m. and this AM however he continues to withdraw a little bit. He told me that he had relapsed and had a  drink the day before coming in and became agitated yesterday afternoon.    Doing much better today and LFTs were elevated so GI was consulted but not trending down.  He can go to inpatient rehabilitation once bed is available.   Assessment and Plan:  Hip dislocation/fracture s/p ORIF Aceteabulum Fracture Dislocation with Foot Drop POD 7 -Apparently mechanical fall resulting in hip fracture -Orthopedics consulted - this is a complicated fracture and Dr. Carola Frost is planning to perform the surgery so he was transferred from [facility] to [facility] --SCDs overnight and now on Enoxaparin 40 mg sq q24h while inpatient and then to be discharged on Eliquis 2.5 mg p.o. twice daily for 30 days -Vitamin D deficiency with low vitamin D of 10.13 and so orthopedic surgery was checking a testosterone panel in the setting of his chronic alcoholism and marijuana use; initiated on cholecalciferol 2000 units p.o. daily will have orthopedic surgery adjust further -Continue current Pain Regimen for now -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 twice daily, MiraLAX 17 g daily as needed for  mild constipation and if necessary will change to Senna-Docusate 1 tab p.o. twice daily; patient also has a Bisacodyl 5 mg p.o. daily as needed for Moderate constipation  -Orthopedic evaluated and recommending suture removal around 07/14/2022   Pre-operative Stratification -Orthopedic/spinal surgery is associated with an intermediate (1-5%) cardiovascular risk for cardiac death and nonfatal MI -With his h/o CHF, his revised cardiac index gives a risk estimate of 6.0% -Because of this risk, he is recommended to have pre-operative EKG testing prior to surgery; this was done in the ER -His Detsky's Modified Cardiac Risk Index score is Class I, with a low cardiac risk -It is reasonable for him to go to the OR without additional evaluation   ETOH Dependence with concern for withdrawal -Patient with chronic ETOH dependence -He was recently discharged from rehab and had started drinking again intermittently and stated that he drank the day before prior to falling -CIWA protocol reinitiated and he is doing better with CIWA Scores stable y; Got as High as 20 during this hospitalization -Folate, thiamine, and MVI ordered -Will provide symptom-triggered BZD (ativan per CIWA protocol) only since the patient is able to communicate; is very confused today and delirious likely in the setting of narcotics; and has no history of severe withdrawal. -TOC team consult for substance abuse counseling -Will also check UDS still not done yet -Consider offering a medication for Alcohol Use Disorder at the time of d/c, to include Disulfuram; Naltrexone; or Acamprosate.  He has been on naltrexone but appears to take it inconsistently.  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 -Continue Buproprion 300 mg po Daily, Buspirone 5 mg po BID, and Fluoxetine 20 mg po Daily   HLD -Held pravastatin 40 mg po Daily given significantly abnormal LFTs but will wait to reinitiate  Renal Insuffiencey, improved  -BUN/Cr Trend: Recent Labs  Lab 07/01/22 0356 07/02/22 0152 07/03/22 0323 07/04/22 0111 07/05/22  0143 07/06/22 0948 07/07/22 0835  BUN 14 25* 15 14 16 12 10   CREATININE 1.13 1.35* 0.94 0.95 0.93 0.78 0.99  -Hold HCTZ and Losartan and IV fluid hydration had been started but now stopped -Avoid Nephrotoxic Medications, Contrast Dyes, Hypotension and Dehydration to Ensure Adequate Renal Perfusion and will need to Renally Adjust Meds -Continue to Monitor and Trend Renal Function carefully and repeat CMP in the AM   Abnormal LFTs -Unclear Etiology -LFT Trend: Recent Labs  Lab 06/30/22 0913 07/02/22 0152 07/03/22 0323 07/04/22 0111 07/05/22 0143 07/06/22 0948  07/07/22 0835  AST 24 29 25 24 18  630* 118*  ALT 27 18 14 15 15  302* 278*  -RUQ U/S done and showed "No significant sonographic abnormality of the liver or gallbladder.  Hypoechogenicity seen superior to the right hemidiaphragm is suspicious for pleural effusion." -Avoid Hepatotoxic Medications if possible  -Acute hepatitis panel done and was negative except it did show reactive HCV Antibody will need further workup for this -Consulted GI for further Evaluation and Recc's and now LFTs Trending down. They are checking PT/INR and GI recommending following up as an outpatient; GI feels that this could have been secondary to shock liver versus a drug-induced injury   Hyponatremia -Mild. Na+ Trend: Recent Labs  Lab 07/01/22 0356 07/02/22 0152 07/03/22 0323 07/04/22 0111 07/05/22 0143 07/06/22 0948 07/07/22 0835  NA 133* 135 134* 135 135 136 134*  -Discontinue po HCTZ while hospitalized  -Continue to Monitor and Trend and repeat CMP in the AM   Leukocytosis, improved -WBC Trend: Recent Labs  Lab 07/01/22 0356 07/02/22 0152 07/03/22 0323 07/04/22 0111 07/05/22 0143 07/06/22 0948 07/07/22 0835  WBC 12.9* 14.2* 8.6 7.5 7.7 8.1 7.7  -Likely Reactive in the setting of above -Continue to Monitor for S/Sx of Infection and repeat CBC in the AM   Normocytic Anemia -Hgb/Hct Trend: Recent Labs  Lab 07/01/22 0356 07/02/22 0152 07/03/22 0323 07/04/22 0111 07/05/22 0143 07/06/22 0948 07/07/22 0835  HGB 10.3* 9.2* 8.0* 8.1* 7.8* 9.1* 9.5*  HCT 30.8* 27.6* 24.1* 24.3* 23.7* 26.9* 28.1*  MCV 96.9 96.8 96.8 96.8 96.0 94.7 95.3  -Checked Anemia Panel and showed an iron level of 13, UIBC of 317, TIBC of 330, saturation ratios of 4%, ferritin level 140, folate level 17.0 and vitamin B12 level 112 -Will start vitamin B supplementation with cyanocobalamin (IM injection intially and po starting 07/03/22) and iron supplementation with Niferex 150 mg po Daily -Continue to Monitor for S/Sx of  Bleeding; No overt bleeding noted -Repeat CBC in the AM  Thrombocytosis -Likely Reactive -Platelet Count Trend: Recent Labs  Lab 07/01/22 0356 07/02/22 0152 07/03/22 0323 07/04/22 0111 07/05/22 0143 07/06/22 0948 07/07/22 0835  PLT 350 333 263 317 333 379 411*  -Continue to Monitor and Trend and Repeat CBC in the AM    GERD/GI Prophylaxis -C/w Pantoprazole 40 mg po Daily  Hypoalbuminemia -Patient's Albumin Trend: Recent Labs  Lab 06/30/22 0913 07/02/22 0152 07/03/22 0323 07/04/22 0111 07/05/22 0143 07/06/22 0948 07/07/22 0835  ALBUMIN 4.1 3.9 3.3* 3.2* 3.0* 3.2* 3.3*  -Continue to Monitor and Trend and repeat CMP in the AM   DVT prophylaxis: enoxaparin (LOVENOX) injection 40 mg Start: 07/01/22 0800 SCDs Start: 06/30/22 1643 SCDs Start: 06/30/22 0957    Code Status: Full Code Family Communication: No family present at bedside  Disposition Plan:  Level of care: Telemetry Medical Status is: Inpatient Remains inpatient appropriate because: Appears improved and medically stable to be transitioned inpatient rehabilitation but  unfortunately no bed availability   Consultants:  Gastroenterology Orthopedic Surgery  Procedures:  As delineated above  Antimicrobials:  Anti-infectives (From admission, onward)    Start     Dose/Rate Route Frequency Ordered Stop   06/30/22 2100  ceFAZolin (ANCEF) IVPB 2g/100 mL premix        2 g 200 mL/hr over 30 Minutes Intravenous Every 8 hours 06/30/22 1642 07/01/22 1547   06/30/22 1100  ceFAZolin (ANCEF) IVPB 2g/100 mL premix        2 g 200 mL/hr over 30 Minutes Intravenous On call to O.R. 06/30/22 1005 06/30/22 1300   06/30/22 1008  ceFAZolin (ANCEF) 2-4 GM/100ML-% IVPB       Note to Pharmacy: Lurena Nida: cabinet override      06/30/22 1008 06/30/22 1315       Subjective: Seen and examined at bedside and he is doing fairly well.  Denies any complaints and still had some pain but felt it is being manageable.  No nausea or  vomiting.  Denies any lightheadedness or dizziness.  No other concerns reported at this time.  Objective: Vitals:   07/06/22 1947 07/07/22 0423 07/07/22 0802 07/07/22 1416  BP: 120/89 (!) 113/96 106/84 118/82  Pulse: 89 95 94 88  Resp: 18 19 16 16   Temp: 98.5 F (36.9 C)  97.6 F (36.4 C) 98.5 F (36.9 C)  TempSrc: Oral   Oral  SpO2: 100% 100% 100% 100%  Weight:      Height:        Intake/Output Summary (Last 24 hours) at 07/07/2022 1652 Last data filed at 07/07/2022 0830 Gross per 24 hour  Intake 360 ml  Output 1000 ml  Net -640 ml   Filed Weights   06/30/22 1019 07/01/22 1000  Weight: 77.1 kg 78.7 kg   Examination: Physical Exam:  Constitutional: WN/WD overweight Caucasian male in NAD appears calm and more comfortable Respiratory: Diminished to Auscultation bilaterally, no wheezing, rales, rhonchi or crackles. Normal respiratory effort and patient is not tachypenic. No accessory muscle use. Unlabored breathing  Cardiovascular: RRR, no murmurs / rubs / gallops. S1 and S2 auscultated. No extremity edema. Abdomen: Soft, non-tender, Distended 2/2 body habitus. Bowel sounds positive.  GU: Deferred. Musculoskeletal: No clubbing / cyanosis of digits/nails. Has a Brace for foot drop Skin: No rashes, lesions, ulcers on a limited skin evaluation. No induration; Warm and dry.  Neurologic: CN 2-12 grossly intact with no focal deficits. Romberg sign and cerebellar reflexes not assessed.  Psychiatric: Normal judgment and insight. Alert and oriented x 3. Normal mood and appropriate affect.   Data Reviewed: I have personally reviewed following labs and imaging studies  CBC: Recent Labs  Lab 07/03/22 0323 07/04/22 0111 07/05/22 0143 07/06/22 0948 07/07/22 0835  WBC 8.6 7.5 7.7 8.1 7.7  NEUTROABS 6.0 4.8 4.7 5.7 5.1  HGB 8.0* 8.1* 7.8* 9.1* 9.5*  HCT 24.1* 24.3* 23.7* 26.9* 28.1*  MCV 96.8 96.8 96.0 94.7 95.3  PLT 263 317 333 379 411*   Basic Metabolic Panel: Recent Labs  Lab  07/03/22 0323 07/04/22 0111 07/05/22 0143 07/06/22 0948 07/07/22 0835  NA 134* 135 135 136 134*  K 3.4* 3.7 3.5 4.1 4.2  CL 99 101 103 102 101  CO2 26 24 23 23 22   GLUCOSE 98 107* 129* 130* 134*  BUN 15 14 16 12 10   CREATININE 0.94 0.95 0.93 0.78 0.99  CALCIUM 8.9 9.1 9.0 9.3 9.5  MG 1.9 2.0 2.1 2.0 1.9  PHOS 2.9 3.6  4.8* 3.8 4.4   GFR: Estimated Creatinine Clearance: 92.2 mL/min (by C-G formula based on SCr of 0.99 mg/dL). Liver Function Tests: Recent Labs  Lab 07/03/22 0323 07/04/22 0111 07/05/22 0143 07/06/22 0948 07/07/22 0835  AST 25 24 18  630* 118*  ALT 14 15 15  302* 278*  ALKPHOS 55 63 62 196* 255*  BILITOT 1.0 0.7 0.7 1.0 0.9  PROT 6.2* 6.4* 6.4* 6.7 6.9  ALBUMIN 3.3* 3.2* 3.0* 3.2* 3.3*   No results for input(s): "LIPASE", "AMYLASE" in the last 168 hours. Recent Labs  Lab 07/03/22 0323  AMMONIA 18   Coagulation Profile: No results for input(s): "INR", "PROTIME" in the last 168 hours. Cardiac Enzymes: No results for input(s): "CKTOTAL", "CKMB", "CKMBINDEX", "TROPONINI" in the last 168 hours. BNP (last 3 results) No results for input(s): "PROBNP" in the last 8760 hours. HbA1C: No results for input(s): "HGBA1C" in the last 72 hours. CBG: Recent Labs  Lab 07/01/22 2008  GLUCAP 109*   Lipid Profile: No results for input(s): "CHOL", "HDL", "LDLCALC", "TRIG", "CHOLHDL", "LDLDIRECT" in the last 72 hours. Thyroid Function Tests: No results for input(s): "TSH", "T4TOTAL", "FREET4", "T3FREE", "THYROIDAB" in the last 72 hours. Anemia Panel: No results for input(s): "VITAMINB12", "FOLATE", "FERRITIN", "TIBC", "IRON", "RETICCTPCT" in the last 72 hours. Sepsis Labs: No results for input(s): "PROCALCITON", "LATICACIDVEN" in the last 168 hours.  Recent Results (from the past 240 hour(s))  Surgical pcr screen     Status: Abnormal   Collection Time: 06/30/22  9:00 AM   Specimen: Nasal Mucosa; Nasal Swab  Result Value Ref Range Status   MRSA, PCR NEGATIVE  NEGATIVE Final   Staphylococcus aureus POSITIVE (A) NEGATIVE Final    Comment: (NOTE) The Xpert SA Assay (FDA approved for NASAL specimens in patients 18 years of age and older), is one component of a comprehensive surveillance program. It is not intended to diagnose infection nor to guide or monitor treatment. Performed at [facility], [address] [place], [place] [address]     Radiology Studies: US Abdomen Limited RUQ (LIVER/GB)  Result Date: 07/06/2022 CLINICAL DATA:  Abnormal liver function tests EXAM: ULTRASOUND ABDOMEN LIMITED RIGHT UPPER QUADRANT COMPARISON:  12/18/2016 FINDINGS: Gallbladder: Gallstones: None Sludge: None Gallbladder Wall: Within normal limits Pericholecystic fluid: None Sonographic Murphy's Sign: Negative per technologist Common bile duct: Diameter: 5 mm Liver: Parenchymal echogenicity: Within normal limits Contours: Normal Lesions: None Portal vein: Patent.  Hepatopetal flow Other: Possible right pleural effusion IMPRESSION: 1. No significant sonographic abnormality of the liver or gallbladder. 2. Hypoechogenicity seen superior to the right hemidiaphragm is suspicious for pleural effusion. Electronically Signed   By: Acquanetta Belling M.D.   On: 07/06/2022 13:52     Scheduled Meds:  amLODipine  10 mg Oral Daily   buPROPion  300 mg Oral Daily   busPIRone  5 mg Oral BID   cholecalciferol  2,000 Units Oral Daily   vitamin B-12  1,000 mcg Oral Daily   docusate sodium  100 mg Oral BID   enoxaparin (LOVENOX) injection  40 mg Subcutaneous Q24H   feeding supplement  237 mL Oral BID BM   FLUoxetine  20 mg Oral Daily   folic acid  1 mg Oral Daily   gabapentin  100 mg Oral BID   iron polysaccharides  150 mg Oral Daily   magnesium oxide  400 mg Oral BID   metoprolol succinate  25 mg Oral Daily   multivitamin with minerals  1 tablet Oral Daily   pantoprazole  40 mg Oral Daily   potassium chloride  10 mEq Oral Daily   thiamine  100 mg Oral Daily   Or   thiamine   100 mg Intravenous Daily   Continuous Infusions:   LOS: 7 days   Marguerita Merles, DO [facility] Available via Epic secure chat 7am-7pm After these hours, please refer to coverage provider listed on [email or web address] 07/07/2022, 4:52 PM

## 2022-07-08 DIAGNOSIS — R7989 Other specified abnormal findings of blood chemistry: Secondary | ICD-10-CM

## 2022-07-08 DIAGNOSIS — S32402A Unspecified fracture of left acetabulum, initial encounter for closed fracture: Secondary | ICD-10-CM | POA: Diagnosis not present

## 2022-07-08 LAB — COMPREHENSIVE METABOLIC PANEL
ALT: 152 U/L — ABNORMAL HIGH (ref 0–44)
AST: 26 U/L (ref 15–41)
Albumin: 3.3 g/dL — ABNORMAL LOW (ref 3.5–5.0)
Alkaline Phosphatase: 205 U/L — ABNORMAL HIGH (ref 38–126)
Anion gap: 12 (ref 5–15)
BUN: 13 mg/dL (ref 6–20)
CO2: 21 mmol/L — ABNORMAL LOW (ref 22–32)
Calcium: 9.6 mg/dL (ref 8.9–10.3)
Chloride: 101 mmol/L (ref 98–111)
Creatinine, Ser: 1.03 mg/dL (ref 0.61–1.24)
GFR, Estimated: >60 mL/min (ref >60)
Glucose, Bld: 136 mg/dL — ABNORMAL HIGH (ref 70–99)
Potassium: 4.3 mmol/L (ref 3.5–5.1)
Sodium: 134 mmol/L — ABNORMAL LOW (ref 135–145)
Total Bilirubin: 0.4 mg/dL (ref 0.3–1.2)
Total Protein: 6.9 g/dL (ref 6.5–8.1)

## 2022-07-08 LAB — HCV RNA QUANT RFLX ULTRA OR GENOTYP
HCV RNA Qnt(log copy/mL): UNDETERMINED log10 IU/mL
HepC Qn: NOT DETECTED IU/mL

## 2022-07-08 LAB — PROTIME-INR
INR: 1 (ref 0.8–1.2)
Prothrombin Time: 13.5 seconds (ref 11.4–15.2)

## 2022-07-08 NOTE — Progress Notes (Addendum)
Progress Note  Primary GI: Dr. Adela Lank  LOS: 8 days   Chief Complaint:Elevated LFTs   Subjective   Patient states he is feeling okay today.  Reports a small bowel movement this morning with increased gas.  Some nausea due to pain from surgery.  Tolerating diet without difficulty.  Requesting pain medicine. No family was present at the time of my evaluation.    Objective   Vital signs in last 24 hours: Temp:  [97.9 F (36.6 C)-98.5 F (36.9 C)] 97.9 F (36.6 C) (06/05 0758) Pulse Rate:  [88-92] 89 (06/05 0758) Resp:  [16-19] 18 (06/05 1013) BP: (118-125)/(82-91) 125/91 (06/05 0758) SpO2:  [100 %] 100 % (06/05 0758) Last BM Date : 07/07/22 Last BM recorded by nurses in past 5 days Stool Type: Type 4 (Like a smooth, soft sausage or snake) (07/07/2022  3:45 AM)  General:   male in no acute distress  Heart:  Regular rate and rhythm; no murmurs Pulm: Clear anteriorly; no wheezing Abdomen: soft, nondistended, normal bowel sounds in all quadrants. Nontender without guarding. No organomegaly appreciated. Extremities:  No edema Neurologic:  Alert and  oriented x4;  No focal deficits.  Psych:  Cooperative. Normal mood and affect.  Intake/Output from previous day: 06/04 0701 - 06/05 0700 In: 360 [P.O.:360] Out: 900 [Urine:900] Intake/Output this shift: No intake/output data recorded.  Studies/Results: US Abdomen Limited RUQ (LIVER/GB)  Result Date: 07/06/2022 CLINICAL DATA:  Abnormal liver function tests EXAM: ULTRASOUND ABDOMEN LIMITED RIGHT UPPER QUADRANT COMPARISON:  12/18/2016 FINDINGS: Gallbladder: Gallstones: None Sludge: None Gallbladder Wall: Within normal limits Pericholecystic fluid: None Sonographic Murphy's Sign: Negative per technologist Common bile duct: Diameter: 5 mm Liver: Parenchymal echogenicity: Within normal limits Contours: Normal Lesions: None Portal vein: Patent.  Hepatopetal flow Other: Possible right pleural effusion IMPRESSION: 1. No significant  sonographic abnormality of the liver or gallbladder. 2. Hypoechogenicity seen superior to the right hemidiaphragm is suspicious for pleural effusion. Electronically Signed   By: Acquanetta Belling M.D.   On: 07/06/2022 13:52    Lab Results: Recent Labs    07/06/22 0948 07/07/22 0835  WBC 8.1 7.7  HGB 9.1* 9.5*  HCT 26.9* 28.1*  PLT 379 411*   BMET Recent Labs    07/06/22 0948 07/07/22 0835  NA 136 134*  K 4.1 4.2  CL 102 101  CO2 23 22  GLUCOSE 130* 134*  BUN 12 10  CREATININE 0.78 0.99  CALCIUM 9.3 9.5   LFT Recent Labs    07/07/22 0835  PROT 6.9  ALBUMIN 3.3*  AST 118*  ALT 278*  ALKPHOS 255*  BILITOT 0.9   PT/INR No results for input(s): "LABPROT", "INR" in the last 72 hours.   Scheduled Meds:  amLODipine  10 mg Oral Daily   buPROPion  300 mg Oral Daily   busPIRone  5 mg Oral BID   cholecalciferol  2,000 Units Oral Daily   vitamin B-12  1,000 mcg Oral Daily   docusate sodium  100 mg Oral BID   enoxaparin (LOVENOX) injection  40 mg Subcutaneous Q24H   feeding supplement  237 mL Oral BID BM   FLUoxetine  20 mg Oral Daily   folic acid  1 mg Oral Daily   gabapentin  100 mg Oral BID   iron polysaccharides  150 mg Oral Daily   magnesium oxide  400 mg Oral BID   metoprolol succinate  25 mg Oral Daily   multivitamin with minerals  1 tablet Oral Daily  pantoprazole  40 mg Oral Daily   potassium chloride  10 mEq Oral Daily   thiamine  100 mg Oral Daily   Or   thiamine  100 mg Intravenous Daily   Continuous Infusions:    Patient profile:   Thomas Williamson is a 47 y.o. male with past medical history significant for alcohol use disorder, bipolar disorder, chronic systolic CHF, hypertension, recent fall with left acetabular fracture and hip dislocation s/p surgical intervention 06/30/22 presents for evaluation of LFTs.    Impression:   Elevated LFTs 6/3 -- AST 630/ ALT 302/ Alk phos 196. T bili 1.0 6/4--  AST 118/ ALT 278/ Alk phos 255. T bili 0.9 Hgb 9.5,  stable Platelets 411 Heb B surface antigen nonreactive Hep A IgM nonreactive Hep B C IgM non reactive HCV ab  reactive RUQ [name]: no significant abnormality of liver or gallbladder. Suspect elevated LFTs secondary to shock liver versus drug-induced injury.  Patient reports history of hep C which has been treated.  HCVRNA in process.  Labs have not been drawn yet today.   Acetabulum fracture - s/p surgical intervention    Plan:   - Ordered LFTs, PT/INR for evaluation today. Appears PT/INR were not drawn yesterday. - Await HCV RNA - can have miralax prn for constipation as well as Gas-x - continue supportive care  Bayley Leanna Sato  07/08/2022, 11:31 AM   Attending physician's note   I have taken a history, reviewed the chart and examined the patient. I performed a substantive portion of this encounter, including complete performance of at least one of the key components, in conjunction with the APP. I agree with the APP's note, impression and recommendations.    LFT are trending down to baseline.  PT and INR within normal range Continue to monitor LFT weekly until trend down to normal Likely etiology drug-induced liver injury Hep C viral load undetectable, he s/p hep C treatment  Follow-up in GI office next available appointment in 3 to 4 months  GI will sign off, available if have any questions   The patient was provided an opportunity to ask questions and all were answered. The patient agreed with the plan and demonstrated an understanding of the instructions.   Iona Beard , MD [phone]

## 2022-07-08 NOTE — Progress Notes (Signed)
PROGRESS NOTE    Thomas Williamson  [name] DOB: [birth year] DOA: 06/30/2022 PCP: Nelwyn Salisbury, MD   Brief Narrative:  This 47 year old Caucasian male with PMH significant for alcohol use disorder, bipolar disorder, chronic systolic CHF, hypertension who presented s/p mechanical fall. He says that he had pain in the hip and states he drank a double" last night prior to fall and feels that it did not contribute to the fall. He recently returned from rehab for his alcoholism and only drinks a few drinks daily now. He was at the [facility] and transferred to [facility] and was found to have a left acetabular fracture and hip dislocation. Orthopedic surgery consulted and underwent surgical intervention on 06/30/2022.  He was transferred to 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 PT OT recommending CIR , awaiting insurance authorization. LFTs were elevated so GI was consulted.  Assessment & Plan:   Principal Problem:   Acetabulum fracture, left (HCC) Active Problems:   Hypertension   Chronic anxiety   Dyslipidemia   Cardiomyopathy, alcoholic (HCC)   Left bundle branch block (LBBB)   Alcohol dependence (HCC)   Left foot drop   Vitamin D deficiency  Left  hip fracture status post mechanical fall: S/p ORIF.  POD # 8. Continue Enoxaparin 40 mg sq q24h while inpatient and then to be discharged on Eliquis 2.5 mg p.o. twice daily for 30 days. Continue current pain regimen. PT and OT recommended CIR, awaiting insurance Auth. 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 -Continue aggressive bowel regimen  -Orthopedic evaluated and recommending suture removal around 07/14/2022   ETOH Dependence with concern for withdrawal: -He was recently discharged from rehab and had started drinking again intermittently and stated that he drank the day before prior to falling -CIWA protocol reinitiated and he is doing better with CIWA Scores stable. -Continue Folate, thiamine, and MVI. -TOC team consult for substance abuse counseling -Will also check UDS still not done yet -Consider offering a medication for Alcohol Use Disorder at the time of d/c, to include Disulfuram; Naltrexone; or Acamprosate.   He has been on naltrexone but appears to take it inconsistently.   Confusion and Delirium, improving, concern for some withdrawal aspect -Placed on delirium precautions -Minimize narcotics and adjustments as necessary. -CT head showed no acute intracranial abnormalities - Work up unremarkable. -Withdrawal symptoms are doing much better and we will hold off initiating Librium   Chronic Systolic CHF: -LVEF in 2020 was 30-35%, likely alcoholic cardiomyopathy. -Strict I's and O's and Daily Weights.  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 Resolved with IV hydration.   Abnormal LFTs: Unclear etiology. -RUQ U/S negative for gallstones. Hypoechogenicity seen superior to the right  hemidiaphragm is suspicious for pleural effusion." -Avoid Hepatotoxic Medications if possible . -Acute hepatitis panel done and was negative except it did show reactive HCV Antibody,  will need further workup for this -Consulted GI for further Evaluation and Recc's and now LFTs Trending down.  -GI recommending following up as an outpatient; GI feels that this could have been secondary to shock liver versus a drug-induced injury.   Hyponatremia: -Discontinue po HCTZ while hospitalized . -Serum sodium improving.   Leukocytosis, improved Likely reactive.   Normocytic Anemia: Anemia panel showed iron level 13 TIBC 330, ferritin 140, folate 17.0, B12 112 Start vitamin B supplementation with cyanocobalamin (IM injection intially and po starting 07/03/22) and iron supplementation with Niferex 150 mg po Daily -Continue to Monitor for S/Sx of Bleeding; No overt bleeding noted -Repeat CBC in the AM   Thrombocytosis: -Likely Reactive. Monitor platelets.   GERD/GI Prophylaxis -C/w Pantoprazole 40 mg po Daily    DVT prophylaxis: enoxaparin (LOVENOX) injection 40 mg Start: 07/01/22 0800 SCDs Start: 06/30/22 1643 SCDs Start: 06/30/22 0957   DVT prophylaxis:Lovenox Code Status: Full code Family Communication: No family at bed side. Disposition Plan:    Status is: Inpatient Remains inpatient appropriate because: Appears improved and medically stable.  Awaiting insurance authorization for CIR.    Consultants:  Orthopedics Gastroenterology  Procedures:s/p ORIF  Antimicrobials:  Anti-infectives (From admission, onward)    Start     Dose/Rate Route Frequency Ordered Stop   06/30/22 2100  ceFAZolin (ANCEF) IVPB 2g/100 mL premix        2 g 200 mL/hr over 30 Minutes Intravenous Every 8 hours 06/30/22 1642 07/01/22 1547   06/30/22 1100  ceFAZolin (ANCEF) IVPB 2g/100 mL premix        2 g 200 mL/hr over 30 Minutes Intravenous On call to O.R. 06/30/22 1005 06/30/22 1300   06/30/22 1008   ceFAZolin (ANCEF) 2-4 GM/100ML-% IVPB       Note to Pharmacy: Lurena Nida: cabinet override      06/30/22 1008 06/30/22 1315       Subjective: Patient was seen and examined at bedside.  Overnight events noted. Patient reports doing much better,  last night he has nausea and vomiting but feels improved.  Objective: Vitals:   07/08/22 0758 07/08/22 1013 07/08/22 1058 07/08/22 1207  BP: (!) 125/91   (!) 116/96  Pulse: 89   86  Resp: 18 18 16 17   Temp: 97.9 F (36.6 C)   98.7 F (37.1 C)  TempSrc: Oral   Oral  SpO2: 100%   100%  Weight:      Height:        Intake/Output Summary (Last 24 hours) at 07/08/2022 1300 Last data filed at 07/07/2022 2256 Gross per 24 hour  Intake --  Output 400 ml  Net -400 ml   Filed Weights   06/30/22 1019 07/01/22 1000  Weight: 77.1 kg 78.7 kg    Examination:  General exam: Appears calm and comfortable, not in any acute distress. Respiratory system: CTA bilaterally. Respiratory effort normal.  RR 15 Cardiovascular system: S1 & S2 heard, RRR. No JVD, murmurs, rubs, gallops or clicks. No pedal edema. Gastrointestinal system: Abdomen is soft, non tender, non distended, BS+ Central nervous system: Alert and oriented X 3. No focal neurological deficits. Extremities: Left hip tenderness +, s/p ORIF Skin: No rashes,  lesions or ulcers Psychiatry: Judgement and insight appear normal. Mood & affect appropriate.     Data Reviewed: I have personally reviewed following labs and imaging studies  CBC: Recent Labs  Lab 07/03/22 0323 07/04/22 0111 07/05/22 0143 07/06/22 0948 07/07/22 0835  WBC 8.6 7.5 7.7 8.1 7.7  NEUTROABS 6.0 4.8 4.7 5.7 5.1  HGB 8.0* 8.1* 7.8* 9.1* 9.5*  HCT 24.1* 24.3* 23.7* 26.9* 28.1*  MCV 96.8 96.8 96.0 94.7 95.3  PLT 263 317 333 379 411*   Basic Metabolic Panel: Recent Labs  Lab 07/03/22 0323 07/04/22 0111 07/05/22 0143 07/06/22 0948 07/07/22 0835  NA 134* 135 135 136 134*  K 3.4* 3.7 3.5 4.1 4.2  CL 99 101  103 102 101  CO2 26 24 23 23 22   GLUCOSE 98 107* 129* 130* 134*  BUN 15 14 16 12 10   CREATININE 0.94 0.95 0.93 0.78 0.99  CALCIUM 8.9 9.1 9.0 9.3 9.5  MG 1.9 2.0 2.1 2.0 1.9  PHOS 2.9 3.6 4.8* 3.8 4.4   GFR: Estimated Creatinine Clearance: 92.2 mL/min (by C-G formula based on SCr of 0.99 mg/dL). Liver Function Tests: Recent Labs  Lab 07/03/22 0323 07/04/22 0111 07/05/22 0143 07/06/22 0948 07/07/22 0835  AST 25 24 18  630* 118*  ALT 14 15 15  302* 278*  ALKPHOS 55 63 62 196* 255*  BILITOT 1.0 0.7 0.7 1.0 0.9  PROT 6.2* 6.4* 6.4* 6.7 6.9  ALBUMIN 3.3* 3.2* 3.0* 3.2* 3.3*   No results for input(s): "LIPASE", "AMYLASE" in the last 168 hours. Recent Labs  Lab 07/03/22 0323  AMMONIA 18   Coagulation Profile: No results for input(s): "INR", "PROTIME" in the last 168 hours. Cardiac Enzymes: No results for input(s): "CKTOTAL", "CKMB", "CKMBINDEX", "TROPONINI" in the last 168 hours. BNP (last 3 results) No results for input(s): "PROBNP" in the last 8760 hours. HbA1C: No results for input(s): "HGBA1C" in the last 72 hours. CBG: Recent Labs  Lab 07/01/22 2008  GLUCAP 109*   Lipid Profile: No results for input(s): "CHOL", "HDL", "LDLCALC", "TRIG", "CHOLHDL", "LDLDIRECT" in the last 72 hours. Thyroid Function Tests: No results for input(s): "TSH", "T4TOTAL", "FREET4", "T3FREE", "THYROIDAB" in the last 72 hours. Anemia Panel: No results for input(s): "VITAMINB12", "FOLATE", "FERRITIN", "TIBC", "IRON", "RETICCTPCT" in the last 72 hours. Sepsis Labs: No results for input(s): "PROCALCITON", "LATICACIDVEN" in the last 168 hours.  Recent Results (from the past 240 hour(s))  Surgical pcr screen     Status: Abnormal   Collection Time: 06/30/22  9:00 AM   Specimen: Nasal Mucosa; Nasal Swab  Result Value Ref Range Status   MRSA, PCR NEGATIVE NEGATIVE Final   Staphylococcus aureus POSITIVE (A) NEGATIVE Final    Comment: (NOTE) The Xpert SA Assay (FDA approved for NASAL specimens  in patients 88 years of age and older), is one component of a comprehensive surveillance program. It is not intended to diagnose infection nor to guide or monitor treatment. Performed at [facility], [address], [place], [place] [address]     Radiology Studies: US Abdomen Limited RUQ (LIVER/GB)  Result Date: 07/06/2022 CLINICAL DATA:  Abnormal liver function tests EXAM: ULTRASOUND ABDOMEN LIMITED RIGHT UPPER QUADRANT COMPARISON:  12/18/2016 FINDINGS: Gallbladder: Gallstones: None Sludge: None Gallbladder Wall: Within normal limits Pericholecystic fluid: None Sonographic Murphy's Sign: Negative per technologist Common bile duct: Diameter: 5 mm Liver: Parenchymal echogenicity: Within normal limits Contours: Normal Lesions: None Portal vein: Patent.  Hepatopetal flow Other: Possible right pleural effusion IMPRESSION: 1. No significant sonographic  abnormality of the liver or gallbladder. 2. Hypoechogenicity seen superior to the right hemidiaphragm is suspicious for pleural effusion. Electronically Signed   By: Acquanetta Belling M.D.   On: 07/06/2022 13:52    Scheduled Meds:  amLODipine  10 mg Oral Daily   buPROPion  300 mg Oral Daily   busPIRone  5 mg Oral BID   cholecalciferol  2,000 Units Oral Daily   vitamin B-12  1,000 mcg Oral Daily   docusate sodium  100 mg Oral BID   enoxaparin (LOVENOX) injection  40 mg Subcutaneous Q24H   feeding supplement  237 mL Oral BID BM   FLUoxetine  20 mg Oral Daily   folic acid  1 mg Oral Daily   gabapentin  100 mg Oral BID   iron polysaccharides  150 mg Oral Daily   magnesium oxide  400 mg Oral BID   metoprolol succinate  25 mg Oral Daily   multivitamin with minerals  1 tablet Oral Daily   pantoprazole  40 mg Oral Daily   potassium chloride  10 mEq Oral Daily   thiamine  100 mg Oral Daily   Or   thiamine  100 mg Intravenous Daily   Continuous Infusions:   LOS: 8 days    Time spent: 50 mins    Willeen Niece, MD [facility]   If 7PM-7AM, please contact night-coverage

## 2022-07-08 NOTE — Progress Notes (Signed)
Occupational Therapy Treatment Patient Details Name: Thomas Williamson MRN: [id number] DOB: 10-20-1975 Today's Date: 07/08/2022   History of present illness 47 y.o. male presenting 5/28 after a fall when tripping over his dog. He was found to have left acetabular fracture and hip location, is now s/p ORIF L acetabulum fracture dislocation with foot drop 06/30/22. PMH significant of ETOH use d/o, bipolar d/o, chronic systolic CHF, and HTN.   OT comments  Pt continuing to progress in OT sessions, demonstrating significantly improved cognition. Pt able to recall WB precautions and 2/3 hip precautions without cueing. Pt ambulating and completing STS with Min guard assist while maintaining WB precaution. Pt did report ongoing nausea and lightheadedness upon ambulation which limited distance to ~27ft. BP checked to bed 136/98 at end of OT session.   Recommendations for follow up therapy are one component of a multi-disciplinary discharge planning process, led by the attending physician.  Recommendations may be updated based on patient status, additional functional criteria and insurance authorization.    Assistance Recommended at Discharge Frequent or constant Supervision/Assistance  Patient can return home with the following  Assistance with cooking/housework;Assist for transportation;A little help with bathing/dressing/bathroom;A little help with walking and/or transfers;Direct supervision/assist for financial management;Direct supervision/assist for medications management;Help with stairs or ramp for entrance   Equipment Recommendations  BSC/3in1    Recommendations for Other Services      Precautions / Restrictions Precautions Precautions: Posterior Hip Precaution Booklet Issued: Yes (comment) Required Braces or Orthoses: Other Brace Other Brace: PRAFO Lt foot drop Restrictions Weight Bearing Restrictions: Yes LLE Weight Bearing: Touchdown weight bearing       Mobility Bed Mobility   Bed  Mobility: Supine to Sit     Supine to sit: Supervision     General bed mobility comments: pt left sitting in recliner    Transfers Overall transfer level: Needs assistance Equipment used: Rolling walker (2 wheels) Transfers: Sit to/from Stand Sit to Stand: Min guard           General transfer comment: Cues to extend LLE to ensure hip precautions when standing/sitting     Balance Overall balance assessment: Needs assistance Sitting-balance support: Feet supported Sitting balance-Leahy Scale: Good     Standing balance support: Reliant on assistive device for balance, Single extremity supported Standing balance-Leahy Scale: Poor                             ADL either performed or assessed with clinical judgement   ADL                                       Functional mobility during ADLs: Min guard;Rolling walker (2 wheels) General ADL Comments: focused session on functional mobility while maintaining precautions    Extremity/Trunk Assessment Upper Extremity Assessment Upper Extremity Assessment: Overall WFL for tasks assessed             Cognition Arousal/Alertness: Awake/alert Behavior During Therapy: WFL for tasks assessed/performed Overall Cognitive Status: Impaired/Different from baseline Area of Impairment: Memory                     Memory: Decreased recall of precautions         General Comments: Pt able to recall WB precaution and 2/3 hip precautions when prompted  Pertinent Vitals/ Pain       Pain Assessment Pain Assessment: Faces Faces Pain Scale: Hurts little more Pain Location: LLE and hip Pain Descriptors / Indicators: Tender, Aching, Sore Pain Intervention(s): Monitored during session, Repositioned         Frequency  Min 2X/week        Progress Toward Goals  OT Goals(current goals can now be found in the care plan section)  Progress towards OT goals: Progressing  toward goals  Acute Rehab OT Goals OT Goal Formulation: With patient Time For Goal Achievement: 07/15/22  Plan Frequency remains appropriate;Discharge plan remains appropriate       AM-PAC OT "6 Clicks" Daily Activity     Outcome Measure   Help from another person eating meals?: None Help from another person taking care of personal grooming?: A Little Help from another person toileting, which includes using toliet, bedpan, or urinal?: A Little Help from another person bathing (including washing, rinsing, drying)?: A Lot Help from another person to put on and taking off regular upper body clothing?: None Help from another person to put on and taking off regular lower body clothing?: A Little 6 Click Score: 19    End of Session Equipment Utilized During Treatment: Gait belt;Rolling walker (2 wheels)  OT Visit Diagnosis: Unsteadiness on feet (R26.81);Pain;Other abnormalities of gait and mobility (R26.89) Pain - Right/Left: Left Pain - part of body: Hip;Leg   Activity Tolerance Patient tolerated treatment well   Patient Left with call bell/phone within reach;in chair;with chair alarm set   Nurse Communication Mobility status        Time: 1610-9604 OT Time Calculation (min): 23 min  Charges: OT General Charges $OT Visit: 1 Visit OT Treatments $Therapeutic Activity: 8-22 mins  07/08/2022  AB, OTR/L  Acute Rehabilitation Services  Office: [phone]   [name] 07/08/2022, 10:23 AM

## 2022-07-08 NOTE — Progress Notes (Signed)
Physical Therapy Treatment Patient Details Name: Thomas Williamson MRN: [id number] DOB: 07/13/1975 Today's Date: 07/08/2022   History of Present Illness 47 y.o. male presenting 5/28 after a fall when tripping over his dog. He was found to have left acetabular fracture and hip location, is now s/p ORIF L acetabulum fracture dislocation with foot drop 06/30/22. PMH significant of ETOH use d/o, bipolar d/o, chronic systolic CHF, and HTN.    PT Comments    Pt was seen for mobility and LE strengthening/ROM as were permitted with his restrictions.  Pt has partial recall of application of his precautions, can verbalize but requires actual use reminders of them.  Pt is open to rehab placement and anxious to get back to work as soon as he is able.  Follow along with him for teaching precautions and monitoring safety with all moving and transitions especially.  Goals of PT are outlined on POC.   Recommendations for follow up therapy are one component of a multi-disciplinary discharge planning process, led by the attending physician.  Recommendations may be updated based on patient status, additional functional criteria and insurance authorization.  Follow Up Recommendations       Assistance Recommended at Discharge Frequent or constant Supervision/Assistance  Patient can return home with the following Direct supervision/assist for medications management;Direct supervision/assist for financial management;Assist for transportation;Help with stairs or ramp for entrance;A little help with walking and/or transfers;A little help with bathing/dressing/bathroom   Equipment Recommendations  Other (comment) (TBD)    Recommendations for Other Services Rehab consult     Precautions / Restrictions Precautions Precautions: Posterior Hip Precaution Booklet Issued: Yes (comment) Required Braces or Orthoses: Other Brace Other Brace: PRAFO Lt foot drop Restrictions Weight Bearing Restrictions: Yes LLE Weight Bearing:  Touchdown weight bearing     Mobility  Bed Mobility               General bed mobility comments: up in recliner    Transfers Overall transfer level: Needs assistance Equipment used: Rolling walker (2 wheels) Transfers: Sit to/from Stand Sit to Stand: Min guard           General transfer comment: reminders about his sitting precautions for hip and foot    Ambulation/Gait Ambulation/Gait assistance: Min assist, Min guard Gait Distance (Feet): 20 Feet Assistive device: Rolling walker (2 wheels)   Gait velocity: reduced     General Gait Details: pt declined crutches reporting he is better with the walker   Stairs             Wheelchair Mobility    Modified Rankin (Stroke Patients Only)       Balance Overall balance assessment: Needs assistance Sitting-balance support: Feet supported Sitting balance-Leahy Scale: Good     Standing balance support: Bilateral upper extremity supported, During functional activity Standing balance-Leahy Scale: Poor                              Cognition Arousal/Alertness: Awake/alert Behavior During Therapy: WFL for tasks assessed/performed Overall Cognitive Status: Impaired/Different from baseline Area of Impairment: Memory, Safety/judgement                     Memory: Decreased recall of precautions Following Commands: Follows one step commands with increased time Safety/Judgement: Decreased awareness of safety Awareness: Intellectual Problem Solving: Requires verbal cues          Exercises General Exercises - Lower Extremity Ankle Circles/Pumps: AROM, 5 reps, Right  Quad Sets: AROM, 10 reps Gluteal Sets: AROM, 10 reps Long Arc Quad: AROM, Strengthening, 10 reps Heel Slides: AROM, 10 reps    General Comments General comments (skin integrity, edema, etc.): Pt is a bit verbose, walked on hallway and reviewed his exercises with pt demonstrating partial awareness of functional application  of precautions      Pertinent Vitals/Pain Pain Assessment Pain Assessment: Faces Faces Pain Scale: Hurts little more Pain Location: LLE and hip Pain Descriptors / Indicators: Guarding, Aching Pain Intervention(s): Limited activity within patient's tolerance, Monitored during session, Premedicated before session, Repositioned    Home Living                          Prior Function            PT Goals (current goals can now be found in the care plan section) Acute Rehab PT Goals Patient Stated Goal: home and to return to work Progress towards PT goals: Progressing toward goals    Frequency    Min 4X/week      PT Plan Current plan remains appropriate    Co-evaluation              AM-PAC PT "6 Clicks" Mobility   Outcome Measure  Help needed turning from your back to your side while in a flat bed without using bedrails?: A Little Help needed moving from lying on your back to sitting on the side of a flat bed without using bedrails?: A Little Help needed moving to and from a bed to a chair (including a wheelchair)?: A Little Help needed standing up from a chair using your arms (e.g., wheelchair or bedside chair)?: A Little Help needed to walk in hospital room?: A Little Help needed climbing 3-5 steps with a railing? : Total 6 Click Score: 16    End of Session Equipment Utilized During Treatment: Gait belt Activity Tolerance: Patient tolerated treatment well Patient left: with call bell/phone within reach;in chair;with chair alarm set Nurse Communication: Mobility status PT Visit Diagnosis: Unsteadiness on feet (R26.81);Pain;Muscle weakness (generalized) (M62.81) Pain - Right/Left: Left Pain - part of body: Leg     Time: 1610-9604 PT Time Calculation (min) (ACUTE ONLY): 27 min  Charges:  $Gait Training: 8-22 mins $Therapeutic Exercise: 8-22 mins            Ivar Drape 07/08/2022, 7:08 PM  Samul Dada, PT PhD Acute Rehab Dept. Number: [facility]  [id number] and [facility] [phone]

## 2022-07-08 NOTE — Progress Notes (Signed)
Nutrition Follow-up  DOCUMENTATION CODES:   Not applicable  INTERVENTION:  Continue meal ordering with assistance Ensure Enlive po BID, each supplement provides 350 kcal and 20 grams of protein. MVI with minerals daily   NUTRITION DIAGNOSIS:   Increased nutrient needs related to hip fracture, post-op healing as evidenced by estimated needs. - ongoing  GOAL:   Patient will meet greater than or equal to 90% of their needs - progressing  MONITOR:   PO intake, Supplement acceptance, Labs, Weight trends, I & O's  REASON FOR ASSESSMENT:   Consult Assessment of nutrition requirement/status, Hip fracture protocol  ASSESSMENT:   47 year old male with PMHx of EtOH use, EtOH cardiomyopathy, bipolar, chronic systolic CHF, HTN presented after a fall found to have left acetabular fracture s/p ORIF 06/30/22.  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   Pt reported having nausea over night and this morning which is new. RN aware and pt had been given zofran. D/t nausea he only ate saltine crackers and fruit cup from his breakfast tray.   Overall he reports trying to eat a little bit of each meal which is an improvement for him as he recalls going days without eating at home when consuming alcohol.   Pt does not recall receiving ensure during admission but is agreeable and wishes to continue with order for ensure to optimize his nutritional status.   Meal completions: 5/31: 75% lunch 6/1: 100% lunch 6/2: 50% breakfast 6/4: 100% breakfast   Weight appears stable throughout admission. Last documented weight is 78.7 kg on 5/29.   Medications: Vitamin D3, Vitamin B12, colace, folvite, nirferex, mag-ox, MVI, protonix, klor-con, thiamine  Labs: sodium 134, alkaline phos 205, ALT 152  Diet Order:   Diet Order             Diet regular Room service appropriate? Yes with Assist; Fluid consistency: Thin  Diet effective now                    EDUCATION NEEDS:   Education needs have been addressed  Skin:  Skin Assessment: Skin Integrity Issues: Skin Integrity Issues:: Incisions Incisions: closed incision left leg  Last BM:  6/4 (type 4- medium)  Height:   Ht Readings from Last 1 Encounters:  06/30/22 5\' 9"  (1.753 m)    Weight:   Wt Readings from Last 1 Encounters:  07/01/22 78.7 kg    Ideal Body Weight:  72.7 kg  BMI:  Body mass index is 25.62 kg/m.  Estimated Nutritional Needs:   Kcal:  2100-2300  Protein:  105-115 grams  Fluid:  2.1-2.3 L/day  Drusilla Kanner, RDN, LDN Clinical Nutrition

## 2022-07-08 NOTE — Progress Notes (Signed)
Inpatient Rehabilitation Admissions Coordinator   I do not have a CIR bed to offer this patient for admit today. I recommend to continue to work toward progression home with [facility] if feasible. I will follow up tomorrow.  Ottie Glazier, RN, MSN Rehab Admissions Coordinator [phone] 07/08/2022 11:41 AM

## 2022-07-09 DIAGNOSIS — S32402A Unspecified fracture of left acetabulum, initial encounter for closed fracture: Secondary | ICD-10-CM | POA: Diagnosis not present

## 2022-07-09 LAB — MAGNESIUM: Magnesium: 2.2 mg/dL (ref 1.7–2.4)

## 2022-07-09 LAB — PHOSPHORUS: Phosphorus: 4.9 mg/dL — ABNORMAL HIGH (ref 2.5–4.6)

## 2022-07-09 MED ORDER — APIXABAN 2.5 MG PO TABS
2.5000 mg | ORAL_TABLET | Freq: Two times a day (BID) | ORAL | Status: DC
  Administered 2022-07-10: 2.5 mg via ORAL
  Filled 2022-07-09: qty 1

## 2022-07-09 NOTE — Progress Notes (Signed)
Occupational Therapy Treatment Patient Details Name: Thomas Williamson MRN: [id number] DOB: May 16, 1975 Today's Date: 07/09/2022   History of present illness 47 y.o. male presenting 5/28 after a fall when tripping over his dog. He was found to have left acetabular fracture and hip location, is now s/p ORIF L acetabulum fracture dislocation with foot drop 06/30/22. PMH significant of ETOH use d/o, bipolar d/o, chronic systolic CHF, and HTN.   OT comments  Pt continuing to progress towards pt focused goals, educated pt on the use of dressing stick to complete LBD. Pt successfully donned underwear and sweat pants with min cues for technique, although he needed tactile cues to prevent hiking his L knee. Pt doing better with TDWB precautions. OT to continue to progress pt as able. Via epic chatwith rehab admissions pt is no longer going to AIR, progressing pt to [facility] level and DC plans update to [facility] services as well.    Recommendations for follow up therapy are one component of a multi-disciplinary discharge planning process, led by the attending physician.  Recommendations may be updated based on patient status, additional functional criteria and insurance authorization.    Assistance Recommended at Discharge Frequent or constant Supervision/Assistance  Patient can return home with the following  Assistance with cooking/housework;Assist for transportation;A little help with bathing/dressing/bathroom;A little help with walking and/or transfers;Direct supervision/assist for financial management;Direct supervision/assist for medications management;Help with stairs or ramp for entrance   Equipment Recommendations  BSC/3in1    Recommendations for Other Services      Precautions / Restrictions Precautions Precautions: Posterior Hip Precaution Booklet Issued: Yes (comment) Required Braces or Orthoses: Other Brace Other Brace: PRAFO Lt foot drop Restrictions Weight Bearing Restrictions: Yes LLE Weight Bearing:  Touchdown weight bearing       Mobility Bed Mobility Overal bed mobility: Needs Assistance Bed Mobility: Supine to Sit     Supine to sit: Supervision     General bed mobility comments: pt left sitting EOB with food tray positioned in front of him    Transfers Overall transfer level: Needs assistance Equipment used: Rolling walker (2 wheels) Transfers: Sit to/from Stand Sit to Stand: Min guard           General transfer comment: STS x2     Balance Overall balance assessment: Needs assistance Sitting-balance support: Feet supported Sitting balance-Leahy Scale: Good Sitting balance - Comments: sitting EOB   Standing balance support: Bilateral upper extremity supported, During functional activity Standing balance-Leahy Scale: Fair Standing balance comment: can pull up underwear while static standing                           ADL either performed or assessed with clinical judgement   ADL                       Lower Body Dressing: With adaptive equipment;Sitting/lateral leans;Minimal assistance Lower Body Dressing Details (indicate cue type and reason): Educated pt on the use of dressing stick to don underwear and sweatpants. Needing min cues for technique and new learning               General ADL Comments: Further education on AE to complete the rest of lower body dressing    Extremity/Trunk Assessment              Vision       Perception     Praxis      Cognition Arousal/Alertness: Awake/alert Behavior During  Therapy: WFL for tasks assessed/performed Overall Cognitive Status: Impaired/Different from baseline                           Safety/Judgement: Decreased awareness of safety     General Comments: Still needs occassional cueing to maintain posterior hip precautions        Exercises      Shoulder Instructions       General Comments VSS on RA    Pertinent Vitals/ Pain       Pain Assessment Pain  Assessment: No/denies pain  Home Living                                          Prior Functioning/Environment              Frequency  Min 2X/week        Progress Toward Goals  OT Goals(current goals can now be found in the care plan section)  Progress towards OT goals: Progressing toward goals  Acute Rehab OT Goals OT Goal Formulation: With patient Time For Goal Achievement: 07/15/22 Potential to Achieve Goals: Good  Plan Frequency remains appropriate;Discharge plan needs to be updated (pt no longer needs CIR, he is high functioning)    Co-evaluation                 AM-PAC OT "6 Clicks" Daily Activity     Outcome Measure   Help from another person eating meals?: None Help from another person taking care of personal grooming?: A Little Help from another person toileting, which includes using toliet, bedpan, or urinal?: A Little Help from another person bathing (including washing, rinsing, drying)?: A Little (with AE) Help from another person to put on and taking off regular upper body clothing?: None Help from another person to put on and taking off regular lower body clothing?: A Little 6 Click Score: 20    End of Session Equipment Utilized During Treatment: Gait belt;Rolling walker (2 wheels)  OT Visit Diagnosis: Unsteadiness on feet (R26.81);Pain;Other abnormalities of gait and mobility (R26.89) Pain - Right/Left: Left Pain - part of body: Hip;Leg   Activity Tolerance Patient tolerated treatment well   Patient Left in bed;with call bell/phone within reach   Nurse Communication Mobility status        Time: 1610-9604 OT Time Calculation (min): 20 min  Charges: OT General Charges $OT Visit: 1 Visit OT Treatments $Self Care/Home Management : 8-22 mins  07/09/2022  AB, OTR/L  Acute Rehabilitation Services  Office: [phone]   Thomas Williamson 07/09/2022, 4:24 PM

## 2022-07-09 NOTE — Progress Notes (Signed)
PROGRESS NOTE    Thomas Williamson  [name] DOB: 16-Sep-1975 DOA: 06/30/2022 PCP: Nelwyn Salisbury, MD   Brief Narrative:  This 47 year old Caucasian male with PMH significant for alcohol use disorder, bipolar disorder, chronic systolic CHF, hypertension who presented s/p mechanical fall. He says that he had pain in the hip and states he drank a double" last night prior to fall and feels that it did not contribute to the fall. He recently returned from rehab for his alcoholism and only drinks a few drinks daily now. He was at the [facility] ED and transferred to [facility] and was found to have a left acetabular fracture and hip dislocation. Orthopedic surgery consulted and underwent surgical intervention on 06/30/2022.  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 PT OT recommending CIR , awaiting insurance authorization. LFTs were elevated so GI was consulted.  Hepatitis C antibody is positive, viral load undetectable.  Assessment & Plan:   Principal Problem:   Acetabulum fracture, left (HCC) Active Problems:   Hypertension   Chronic anxiety   Dyslipidemia   Cardiomyopathy, alcoholic (HCC)   Left bundle branch block (LBBB)   Alcohol dependence (HCC)   Left foot drop   Vitamin D deficiency   Abnormal LFTs  Left  hip fracture status post mechanical fall: S/p ORIF.  POD # 9. Continue Enoxaparin 40 mg sq q24h while inpatient and then to be discharged on Eliquis 2.5 mg p.o. twice daily for 30 days. Continue current pain regimen. PT and OT recommended CIR, awaiting insurance Auth. Orthopaedics recommending TDWB Left Leg 8 weeks with Posterior Hip Precautions x 12 weeks  Orthopedic surgery also recommending radiation therapy for heterotrophic ossification prophylaxis this has been arranged at the [facility] radiation oncology center patient was taken and brought back on 07/01/2022 -[facility] to start the process for an AFO for his foot drop. -Continue aggressive bowel regimen. -Orthopedic evaluated and recommending suture removal around 07/14/2022   ETOH Dependence with concern for withdrawal: -He was recently discharged from rehab and had started drinking again intermittently and stated that he drank the day before prior to falling -CIWA protocol reinitiated and he is doing better with CIWA Scores stable. -Continue Folate, thiamine, and MVI. -TOC team consult for substance abuse counseling -Will also check UDS still not done yet -Consider offering a medication for Alcohol Use Disorder at the time of d/c, to include Disulfuram; Naltrexone; or Acamprosate.   He has been on naltrexone but appears to take it inconsistently.   Confusion and Delirium, improving, concern for some withdrawal aspect -Placed on delirium precautions -Minimize narcotics and adjustments as necessary. -CT head showed no acute intracranial abnormalities - Work up unremarkable. -Withdrawal symptoms are doing much better and we will hold off initiating Librium   Chronic Systolic CHF: -LVEF in 2020 was 30-35%, likely alcoholic cardiomyopathy. -Strict I's and O's and Daily Weights.  -IVF Hydration now stopped -C/w Losartan 100 mg po daily and Metoprolol Succinate 25 mg po Daily -Continue to Monitor for S/Sx of Volume Overload.   Essential HTN: -Continue Amlodipine 10 mg po Daily and Metoprolol Succinate 25 mg po Daily -Hold Losartan 100 mg po daily and HCTZ -Continue to Monitor BP per Protocol.   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 -Continue Buproprion 300 mg po Daily, Buspirone 5 mg po BID, and Fluoxetine 20 mg po Daily  HLD: -Held pravastatin 40 mg po Daily given significantly abnormal LFTs   Renal Insuffiencey> improved . Resolved with IV hydration.   Abnormal LFTs: Unclear etiology. -RUQ U/S  negative for gallstones. -Avoid Hepatotoxic Medications if possible . -Acute hepatitis panel was negative except + Anti HCV, viral load undetectable. -Consulted GI for further Evaluation and Recc's and now LFTs Trending down.  -GI recommending following up as an outpatient; GI feels that this could have been secondary to shock liver versus a drug-induced injury.   Hyponatremia: -Discontinue po HCTZ while hospitalized . -Serum sodium improving.   Leukocytosis> Resolved. Likely reactive.   Normocytic Anemia: Anemia panel showed iron level 13 TIBC 330, ferritin 140, folate 17.0, B12 112 Continue  vitamin B supplementation with cyanocobalamin (IM injection intially and po starting 07/03/22) and iron supplementation with Niferex 150 mg po Daily -Continue to Monitor for S/Sx of Bleeding; No overt bleeding noted -Repeat CBC in the AM   Thrombocytosis: -Likely Reactive. Monitor platelets.   GERD/GI Prophylaxis -Continue Pantoprazole 40 mg po Daily    DVT prophylaxis: enoxaparin (LOVENOX) injection 40 mg Start: 07/01/22 0800 SCDs Start: 06/30/22 1643 SCDs Start: 06/30/22 0957   DVT prophylaxis:Lovenox Code Status: Full code Family Communication: No family at bed side. Disposition Plan:    Status is: Inpatient Remains inpatient appropriate because: Appears improved and medically stable.  Awaiting insurance authorization for CIR.    Consultants:  Orthopedics Gastroenterology  Procedures:s/p ORIF  Antimicrobials:  Anti-infectives (From admission, onward)    Start     Dose/Rate Route Frequency Ordered Stop   06/30/22 2100  ceFAZolin (ANCEF) IVPB 2g/100 mL premix        2 g 200 mL/hr over 30 Minutes Intravenous Every 8 hours 06/30/22 1642 07/01/22 1547   06/30/22 1100  ceFAZolin (ANCEF) IVPB 2g/100 mL premix        2 g 200 mL/hr over 30 Minutes Intravenous On call to O.R. 06/30/22 1005 06/30/22 1300   06/30/22 1008  ceFAZolin (ANCEF) 2-4 GM/100ML-% IVPB       Note to  Pharmacy: Lurena Nida: cabinet override      06/30/22 1008 06/30/22 1315       Subjective: Patient was seen and examined at bedside.  Overnight events noted. Patient reports doing much improved.  He reports pain is reasonably controlled. He is awaiting insurance authorization for CIR.  Objective: Vitals:   07/08/22 1717 07/08/22 2129 07/09/22 0512 07/09/22 0827  BP:  129/85 128/83 120/83  Pulse:  93 84 91  Resp: 15 18 14 18   Temp:  98.6 F (37 C) 98.4 F (36.9 C) 98.1 F (36.7 C)  TempSrc:  Oral Oral Oral  SpO2:  99% 100% 99%  Weight:      Height:        Intake/Output Summary (Last 24 hours) at 07/09/2022 1118 Last data filed at 07/09/2022 0830 Gross per 24 hour  Intake 590 ml  Output 1300 ml  Net -710 ml   Filed Weights   06/30/22 1019 07/01/22 1000  Weight: 77.1 kg 78.7 kg    Examination:  General exam: Appears comfortable, calm, not in any acute distress. Respiratory system: CTA bilaterally. Respiratory effort normal.  RR 14 Cardiovascular system: S1 & S2 heard, regular rate and rhythm, no murmur. Gastrointestinal system: Abdomen is soft, non tender, non distended, BS+ Central nervous system: Alert and oriented X 3. No focal neurological deficits. Extremities: Left hip tenderness +, s/p ORIF Skin: No rashes, lesions or ulcers Psychiatry: Judgement and insight appear  normal. Mood & affect appropriate.     Data Reviewed: I have personally reviewed following labs and imaging studies  CBC: Recent Labs  Lab 07/03/22 0323 07/04/22 0111 07/05/22 0143 07/06/22 0948 07/07/22 0835  WBC 8.6 7.5 7.7 8.1 7.7  NEUTROABS 6.0 4.8 4.7 5.7 5.1  HGB 8.0* 8.1* 7.8* 9.1* 9.5*  HCT 24.1* 24.3* 23.7* 26.9* 28.1*  MCV 96.8 96.8 96.0 94.7 95.3  PLT 263 317 333 379 411*   Basic Metabolic Panel: Recent Labs  Lab 07/04/22 0111 07/05/22 0143 07/06/22 0948 07/07/22 0835 07/08/22 1234 07/09/22 0502  NA 135 135 136 134* 134*  --   K 3.7 3.5 4.1 4.2 4.3  --   CL 101 103  102 101 101  --   CO2 24 23 23 22  21*  --   GLUCOSE 107* 129* 130* 134* 136*  --   BUN 14 16 12 10 13   --   CREATININE 0.95 0.93 0.78 0.99 1.03  --   CALCIUM 9.1 9.0 9.3 9.5 9.6  --   MG 2.0 2.1 2.0 1.9  --  2.2  PHOS 3.6 4.8* 3.8 4.4  --  4.9*   GFR: Estimated Creatinine Clearance: 88.7 mL/min (by C-G formula based on SCr of 1.03 mg/dL). Liver Function Tests: Recent Labs  Lab 07/04/22 0111 07/05/22 0143 07/06/22 0948 07/07/22 0835 07/08/22 1234  AST 24 18 630* 118* 26  ALT 15 15 302* 278* 152*  ALKPHOS 63 62 196* 255* 205*  BILITOT 0.7 0.7 1.0 0.9 0.4  PROT 6.4* 6.4* 6.7 6.9 6.9  ALBUMIN 3.2* 3.0* 3.2* 3.3* 3.3*   No results for input(s): "LIPASE", "AMYLASE" in the last 168 hours. Recent Labs  Lab 07/03/22 0323  AMMONIA 18   Coagulation Profile: Recent Labs  Lab 07/08/22 1234  INR 1.0   Cardiac Enzymes: No results for input(s): "CKTOTAL", "CKMB", "CKMBINDEX", "TROPONINI" in the last 168 hours. BNP (last 3 results) No results for input(s): "PROBNP" in the last 8760 hours. HbA1C: No results for input(s): "HGBA1C" in the last 72 hours. CBG: No results for input(s): "GLUCAP" in the last 168 hours.  Lipid Profile: No results for input(s): "CHOL", "HDL", "LDLCALC", "TRIG", "CHOLHDL", "LDLDIRECT" in the last 72 hours. Thyroid Function Tests: No results for input(s): "TSH", "T4TOTAL", "FREET4", "T3FREE", "THYROIDAB" in the last 72 hours. Anemia Panel: No results for input(s): "VITAMINB12", "FOLATE", "FERRITIN", "TIBC", "IRON", "RETICCTPCT" in the last 72 hours. Sepsis Labs: No results for input(s): "PROCALCITON", "LATICACIDVEN" in the last 168 hours.  Recent Results (from the past 240 hour(s))  Surgical pcr screen     Status: Abnormal   Collection Time: 06/30/22  9:00 AM   Specimen: Nasal Mucosa; Nasal Swab  Result Value Ref Range Status   MRSA, PCR NEGATIVE NEGATIVE Final   Staphylococcus aureus POSITIVE (A) NEGATIVE Final    Comment: (NOTE) The Xpert SA  Assay (FDA approved for NASAL specimens in patients 59 years of age and older), is one component of a comprehensive surveillance program. It is not intended to diagnose infection nor to guide or monitor treatment. Performed at [facility], [address], [place], [place] [address]     Radiology Studies: No results found.  Scheduled Meds:  amLODipine  10 mg Oral Daily   buPROPion  300 mg Oral Daily   busPIRone  5 mg Oral BID   cholecalciferol  2,000 Units Oral Daily   vitamin B-12  1,000 mcg Oral Daily   docusate sodium  100 mg Oral  BID   enoxaparin (LOVENOX) injection  40 mg Subcutaneous Q24H   feeding supplement  237 mL Oral BID BM   FLUoxetine  20 mg Oral Daily   folic acid  1 mg Oral Daily   gabapentin  100 mg Oral BID   iron polysaccharides  150 mg Oral Daily   magnesium oxide  400 mg Oral BID   metoprolol succinate  25 mg Oral Daily   multivitamin with minerals  1 tablet Oral Daily   pantoprazole  40 mg Oral Daily   potassium chloride  10 mEq Oral Daily   thiamine  100 mg Oral Daily   Or   thiamine  100 mg Intravenous Daily   Continuous Infusions:   LOS: 9 days    Time spent: 35 mins    Willeen Niece, MD [facility]   If 7PM-7AM, please contact night-coverage

## 2022-07-09 NOTE — Progress Notes (Signed)
Inpatient Rehabilitation Admissions Coordinator   I have no bed available to admit today to CIR for this patient. Encourage to continue to progress with goal to discharge home if bed continues not to be available.  Ottie Glazier, RN, MSN Rehab Admissions Coordinator [phone] 07/09/2022 11:02 AM   Ottie Glazier, RN, MSN Rehab Admissions Coordinator [phone] 07/09/2022 11:02 AM

## 2022-07-09 NOTE — Progress Notes (Addendum)
Inpatient Rehabilitation Admissions Coordinator   I spoke to patient and he reports ambulating in room today with RW alone and worked with therapy with adaptive equipment to dress . I explained that he has progressed over the last 48 hrs to a level that he no longer requires a Cir admit. Has done well as expected from our discussion on 6/4. I have alerted acute team and TOC. We will sign off.  Ottie Glazier, RN, MSN Rehab Admissions Coordinator [phone] 07/09/2022 4:22 PM  I received a call from his parents. They are aware of planned discharge and would like TOC CM to call them to discuss options of HH vs OP. I have alerted TOC.  Ottie Glazier, RN, MSN Rehab Admissions Coordinator [phone] 07/09/2022 4:46 PM

## 2022-07-09 NOTE — Plan of Care (Signed)

## 2022-07-10 ENCOUNTER — Other Ambulatory Visit: Payer: Self-pay | Admitting: Family Medicine

## 2022-07-10 DIAGNOSIS — S32402A Unspecified fracture of left acetabulum, initial encounter for closed fracture: Secondary | ICD-10-CM | POA: Diagnosis not present

## 2022-07-10 LAB — TESTOSTERONE, % FREE: Testosterone-% Free: 1.1 % — ABNORMAL HIGH (ref 0.2–0.7)

## 2022-07-10 LAB — BASIC METABOLIC PANEL
Anion gap: 14 (ref 5–15)
BUN: 17 mg/dL (ref 6–20)
CO2: 22 mmol/L (ref 22–32)
Calcium: 9.8 mg/dL (ref 8.9–10.3)
Chloride: 102 mmol/L (ref 98–111)
Creatinine, Ser: 1 mg/dL (ref 0.61–1.24)
GFR, Estimated: >60 mL/min (ref >60)
Glucose, Bld: 124 mg/dL — ABNORMAL HIGH (ref 70–99)
Potassium: 4 mmol/L (ref 3.5–5.1)
Sodium: 138 mmol/L (ref 135–145)

## 2022-07-10 LAB — MAGNESIUM: Magnesium: 2 mg/dL (ref 1.7–2.4)

## 2022-07-10 LAB — PHOSPHORUS: Phosphorus: 4.2 mg/dL (ref 2.5–4.6)

## 2022-07-10 MED ORDER — VITAMIN B-1 100 MG PO TABS: 100.0000 mg | ORAL_TABLET | Freq: Every day | ORAL | 0 refills | Status: AC

## 2022-07-10 MED ORDER — APIXABAN 2.5 MG PO TABS: 2.5000 mg | ORAL_TABLET | Freq: Two times a day (BID) | ORAL | 0 refills | Status: DC

## 2022-07-10 MED ORDER — OXYCODONE HCL 5 MG PO TABS: 5.0000 mg | ORAL_TABLET | Freq: Four times a day (QID) | ORAL | 0 refills | Status: DC | PRN

## 2022-07-10 MED ORDER — CYANOCOBALAMIN 1000 MCG PO TABS: 1000.0000 ug | ORAL_TABLET | Freq: Every day | ORAL | 0 refills | Status: AC

## 2022-07-10 MED ORDER — METHOCARBAMOL 1000 MG PO TABS: 1000.0000 mg | ORAL_TABLET | Freq: Four times a day (QID) | ORAL | 0 refills | Status: AC | PRN

## 2022-07-10 NOTE — Progress Notes (Signed)
Physical Therapy Treatment Patient Details Name: Thomas Williamson MRN: [id number] DOB: 29-Jul-1975 Today's Date: 07/10/2022   History of Present Illness 47 y.o. male presenting 5/28 after a fall when tripping over his dog. He was found to have left acetabular fracture and hip location, is now s/p ORIF L acetabulum fracture dislocation with foot drop 06/30/22. PMH significant of ETOH use d/o, bipolar d/o, chronic systolic CHF, and HTN.    PT Comments    Pt was seen for progression of mobility with plan to practice gait.  Pt is a bit upset, has determined he needs to get the discharge done as soon as possible.  Talked with  him about expectations, as he has a lot of questions.  Reviewed hip precautions and LE exercises, and talked with  him about the L foot PRAFO which is being replaced.  Pt had physician assist step into session and was able to answer some of his questions.  Case manager also arrived to review his outpatient therapy plan, which fairly well finally answered all questions.  PT will continue on an outpatient basis after dc.  Handouts given for all instructions and exercises.   Recommendations for follow up therapy are one component of a multi-disciplinary discharge planning process, led by the attending physician.  Recommendations may be updated based on patient status, additional functional criteria and insurance authorization.  Follow Up Recommendations       Assistance Recommended at Discharge Intermittent Supervision/Assistance  Patient can return home with the following A little help with bathing/dressing/bathroom;Assistance with cooking/housework;Direct supervision/assist for medications management;Assist for transportation;Help with stairs or ramp for entrance   Equipment Recommendations  Other (comment) (TBD)    Recommendations for Other Services       Precautions / Restrictions Precautions Precautions: Posterior Hip Precaution Booklet Issued: Yes (comment) Precaution  Comments: remembers precautions when asked Required Braces or Orthoses: Other Brace Other Brace: PRAFO Lt foot drop Restrictions Weight Bearing Restrictions: Yes LLE Weight Bearing: Touchdown weight bearing     Mobility  Bed Mobility Overal bed mobility: Needs Assistance Bed Mobility: Supine to Sit     Supine to sit: Supervision     General bed mobility comments: supervision for maintaining precautions    Transfers                   General transfer comment: deferred    Ambulation/Gait               General Gait Details: deferred   Stairs             Wheelchair Mobility    Modified Rankin (Stroke Patients Only)       Balance     Sitting balance-Leahy Scale: Good                                      Cognition Arousal/Alertness: Awake/alert Behavior During Therapy: WFL for tasks assessed/performed Overall Cognitive Status: Impaired/Different from baseline Area of Impairment: Awareness, Safety/judgement, Attention                         Safety/Judgement: Decreased awareness of safety Awareness: Intellectual Problem Solving: Requires verbal cues General Comments: Pt is getting through verbal precautions and requires some reminding functionally        Exercises Total Joint Exercises Ankle Circles/Pumps: AROM, Both, AAROM, 5 reps Quad Sets: AROM, 10 reps Gluteal Sets: AROM, 10  reps Heel Slides: AROM, 10 reps (ROM limit to LLE) Hip ABduction/ADduction: AROM, 10 reps Straight Leg Raises: AROM, 10 reps, Right    General Comments General comments (skin integrity, edema, etc.): Pt reviewed precautions and exercises for  home, is set up to go to outpatient therapy upon discharge and talked with him about the replacement for [facility] having been ordered      Pertinent Vitals/Pain Pain Assessment Pain Assessment: Faces Faces Pain Scale: Hurts little more Pain Location: LLE and hip Pain Descriptors / Indicators:  Operative site guarding Pain Intervention(s): Monitored during session, Repositioned    Home Living                          Prior Function            PT Goals (current goals can now be found in the care plan section) Acute Rehab PT Goals Patient Stated Goal: to get home and have boot replaced Progress towards PT goals: Progressing toward goals    Frequency    Min 4X/week      PT Plan Discharge plan needs to be updated    Co-evaluation              AM-PAC PT "6 Clicks" Mobility   Outcome Measure  Help needed turning from your back to your side while in a flat bed without using bedrails?: A Little Help needed moving from lying on your back to sitting on the side of a flat bed without using bedrails?: A Little Help needed moving to and from a bed to a chair (including a wheelchair)?: A Little Help needed standing up from a chair using your arms (e.g., wheelchair or bedside chair)?: A Little Help needed to walk in hospital room?: A Little Help needed climbing 3-5 steps with a railing? : Total 6 Click Score: 16    End of Session   Activity Tolerance: Patient tolerated treatment well Patient left: with call bell/phone within reach;in chair;with chair alarm set Nurse Communication: Mobility status PT Visit Diagnosis: Unsteadiness on feet (R26.81);Pain;Muscle weakness (generalized) (M62.81) Pain - Right/Left: Left Pain - part of body: Leg     Time: 1610-9604 PT Time Calculation (min) (ACUTE ONLY): 30 min  Charges:  $Therapeutic Exercise: 8-22 mins $Therapeutic Activity: 8-22 mins           Ivar Drape 07/10/2022, 4:04 PM  Samul Dada, PT PhD Acute Rehab Dept. Number: [facility] [id number] and [facility] [phone]

## 2022-07-10 NOTE — Progress Notes (Signed)
Patient has questions re: how wet his suture site can get once discharged (can he shower, should it be covered). Also has questions re: boot sizing, because the one he's currently wearing is too big (will he get another prior to d/c). Encouraged patient to cover this questions with rounding provider this AM. Pain adequately managed with prn regimen this morning; bed low and locked, call bell and bedside table within reach.

## 2022-07-10 NOTE — Discharge Summary (Signed)
Physician Discharge Summary  Thomas Williamson UEA:[id number] DOB: [birth year] DOA: 06/30/2022  PCP: Nelwyn Salisbury, MD  Admit date: 06/30/2022  Discharge date: 07/10/2022  Admitted From: Home.  Disposition:  Home  Recommendations for Outpatient Follow-up:  Follow up with PCP in 1-2 weeks.. Please obtain BMP/CBC in one week Advised to follow-up with orthopedics in 1 week. Advised to follow-up with gastroenterology in 3 to 4 months for HCV. Outpatient physical therapy has been recommended.  Home Health:None Equipment/Devices:None  Discharge Condition: Stable CODE STATUS:Full code Diet recommendation: Heart Healthy   Brief [facility] Course: This 47 year old Caucasian male with PMH significant for alcohol use disorder, bipolar disorder, chronic systolic CHF, hypertension who presented s/p mechanical fall. He says that he had pain in the hip and states he drank a double" last night prior to fall and feels that it did not contribute to the fall. He recently returned from rehab for his alcoholism and only drinks a few drinks daily now. He was at the [facility] ED and transferred to [facility] and was found to have a left acetabular fracture and hip dislocation. Orthopedic surgery consulted and underwent surgical intervention on 06/30/2022. 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 PT OT recommending CIR , awaiting insurance authorization.  Patient has made significant improvement with physical therapy.  Outpatient physical therapy recommended and arranged.  Patient feels better and wants to be discharged.  Patient being discharged home. LFTs were elevated so GI was consulted.  Hepatitis C antibody is positive, viral load undetectable.  GI recommended outpatient follow-up in 3 to 4  months.   Discharge Diagnoses:  Principal Problem:   Acetabulum fracture, left (HCC) Active Problems:   Hypertension   Chronic anxiety   Dyslipidemia   Cardiomyopathy, alcoholic (HCC)   Left bundle branch block (LBBB)   Alcohol dependence (HCC)   Left foot drop   Vitamin D deficiency   Abnormal LFTs   Left  hip fracture status post mechanical fall: S/p ORIF.  POD # 10. He is discharged on Eliquis 2.5 mg p.o. twice daily for 30 days. Continue current pain regimen. PT and OT initially recommended CIR.  Patient has made significant improvement.  Outpatient PT recommended. 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 -Continue aggressive bowel regimen. -Orthopedic evaluated and recommending suture removal around 07/14/2022. -Outpatient PT recommended and arranged.  Patient being discharged home   ETOH Dependence with concern for withdrawal: -He was recently discharged from rehab and had started drinking again intermittently and stated that he drank the day before prior to falling -CIWA protocol reinitiated and he is doing better with CIWA Scores stable. -Continue Folate, thiamine, and MVI. -TOC team consult for substance abuse counseling   Confusion and Delirium, improving, concern for some withdrawal aspect -Placed on delirium precautions -Minimize narcotics and adjustments as necessary. -CT head showed no acute intracranial abnormalities - Work up unremarkable. -Withdrawal symptoms are doing much better and we will hold off initiating Librium   Chronic Systolic CHF: -LVEF in 2020 was 30-35%, likely alcoholic cardiomyopathy. -Strict  I's and O's and Daily Weights.   -IVF Hydration now stopped -C/w Losartan 100 mg po daily and Metoprolol Succinate 25 mg po Daily -Continue to Monitor for S/Sx of Volume Overload.   Essential HTN: -Continue Amlodipine 10 mg po Daily and Metoprolol Succinate 25 mg po Daily -Continue to Monitor BP per Protocol.   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 -Continue Buproprion 300 mg po Daily, Buspirone 5 mg po BID, and Fluoxetine 20 mg po Daily   HLD: -Held pravastatin 40 mg po Daily given significantly abnormal LFTs   Renal Insuffiencey> improved . Resolved with IV hydration.   Abnormal LFTs: Unclear etiology. -RUQ U/S negative for gallstones. -Avoid Hepatotoxic Medications if possible . -Acute hepatitis panel was negative except + Anti HCV, viral load undetectable. -Consulted GI for further Evaluation and Recc's and now LFTs Trending down.  -GI recommending following up as an outpatient; GI feels that this could have been secondary to shock liver versus a drug-induced injury.   Hyponatremia: -Discontinue po HCTZ while hospitalized . -Serum sodium improving.   Leukocytosis> Resolved. Likely reactive.   Normocytic Anemia: Anemia panel showed iron level 13 TIBC 330, ferritin 140, folate 17.0, B12 112 Continue  vitamin B supplementation with cyanocobalamin (IM injection intially and po starting 07/03/22) and iron supplementation with Niferex 150 mg po Daily -Continue to Monitor for S/Sx of Bleeding; No overt bleeding noted -Repeat CBC in the AM   Thrombocytosis: -Likely Reactive. Monitor platelets.   GERD/GI Prophylaxis -Continue Pantoprazole 40 mg po Daily  Discharge Instructions  Discharge Instructions     Ambulatory referral to Occupational Therapy   Complete by: As directed    Ambulatory referral to Physical Therapy   Complete by: As directed    Call MD for:  difficulty breathing, headache or visual disturbances   Complete by: As directed    Call MD for:  persistant dizziness or light-headedness    Complete by: As directed    Call MD for:  persistant nausea and vomiting   Complete by: As directed    Diet - low sodium heart healthy   Complete by: As directed    Diet Carb Modified   Complete by: As directed    Discharge instructions   Complete by: As directed    Advised to follow-up with primary care physician in 1 week. Advised to follow-up with orthopedics in 1 week. Advised to follow-up with gastroenterology in 3 to 4 months for HCV. Outpatient physical therapy has been recommended.   Increase activity slowly   Complete by: As directed       Allergies as of 07/10/2022       Reactions   Hydrocodone    Can tolerate        Medication List     TAKE these medications    amLODipine 10 MG tablet Commonly known as: NORVASC Take 1 tablet (10 mg total) by mouth daily.   apixaban 2.5 MG Tabs tablet Commonly known as: ELIQUIS Take 1 tablet (2.5 mg total) by mouth 2 (two) times daily.   buPROPion 300 MG 24 hr tablet Commonly known as: WELLBUTRIN XL Take 1 tablet (300 mg total) by mouth daily.   busPIRone 5 MG tablet Commonly known as: BUSPAR Take 1 tablet (5 mg total) by mouth 2 (two) times daily.   cyanocobalamin 1000 MCG tablet Take 1 tablet (1,000 mcg total) by mouth daily. Start taking on: July 11, 2022   FLUoxetine 20 MG capsule Commonly known as: PROZAC Take 20 mg by mouth daily.  folic acid 1 MG tablet Commonly known as: FOLVITE Take 1 tablet (1 mg total) by mouth daily.   gabapentin 100 MG capsule Commonly known as: NEURONTIN Take 100 mg by mouth 2 (two) times daily.   LORazepam 1 MG tablet Commonly known as: ATIVAN Take 1 tablet (1 mg total) by mouth every 8 (eight) hours as needed for anxiety.   losartan-hydrochlorothiazide 100-25 MG tablet Commonly known as: HYZAAR TAKE 1 TABLET BY MOUTH EVERY DAY   Magnesium Oxide 400 MG Caps Take 1 capsule (400 mg total) by mouth 2 (two) times daily.   Methocarbamol 1000 MG Tabs Take 1,000 mg by  mouth every 6 (six) hours as needed for up to 10 days for muscle spasms.   metoprolol succinate 25 MG 24 hr tablet Commonly known as: TOPROL-XL Take 1 tablet (25 mg total) by mouth daily.   naltrexone 50 MG tablet Commonly known as: DEPADE Take 50 mg by mouth daily.   Omeprazole 20 MG Tbec Take 20 mg by mouth daily.   oxyCODONE 5 MG immediate release tablet Commonly known as: Oxy IR/ROXICODONE Take 1 tablet (5 mg total) by mouth every 6 (six) hours as needed for up to 5 days for breakthrough pain or severe pain ((for MODERATE breakthrough pain)).   potassium chloride 10 MEQ tablet Commonly known as: KLOR-CON M Take 1 tablet (10 mEq total) by mouth daily.   pravastatin 40 MG tablet Commonly known as: PRAVACHOL TAKE 1 TABLET BY MOUTH EVERY DAY   thiamine 100 MG tablet Commonly known as: Vitamin B-1 Take 1 tablet (100 mg total) by mouth daily. Start taking on: July 11, 2022               Durable Medical Equipment  (From admission, onward)           Start     Ordered   07/10/22 1157  For home use only DME Bedside commode  Once       Comments: Confine to one room  Question:  Patient needs a bedside commode to treat with the following condition  Answer:  Fx   07/10/22 1158   07/10/22 1156  For home use only DME Walker rolling  Once       Question Answer Comment  Walker: With 5 Inch Wheels   Patient needs a walker to treat with the following condition Fx      07/10/22 1158            Follow-up Information     Nelwyn Salisbury, MD Follow up.   Specialty: Family Medicine Contact information: [address] [place] [address] [phone]         [facility] Follow up.   Contact information: [facility] [address] [place] [address] [phone] Collier Bullock (F)        Armbruster, Willaim Rayas, MD Follow up on 10/21/2022.   Specialty: Gastroenterology Why: appt is at 8:30am. please  arrive 10-15 minutes prior Contact information: [address] [place] [address] [phone]         [facility] Follow up.   Why: Refferral made for outpatient PT and OT. Please call and arrange appointment time. Contact information: [facility] [address] [place], [place] [address] Phone: [phone]               Allergies  Allergen Reactions   Hydrocodone  Can tolerate    Consultations: Orthopaedics Gastroenterology   Procedures/Studies: US Abdomen Limited RUQ (LIVER/GB)  Result Date: 07/06/2022 CLINICAL DATA:  Abnormal liver function tests EXAM: ULTRASOUND ABDOMEN LIMITED RIGHT UPPER QUADRANT COMPARISON:  12/18/2016 FINDINGS: Gallbladder: Gallstones: None Sludge: None Gallbladder Wall: Within normal limits Pericholecystic fluid: None Sonographic Murphy's Sign: Negative per technologist Common bile duct: Diameter: 5 mm Liver: Parenchymal echogenicity: Within normal limits Contours: Normal Lesions: None Portal vein: Patent.  Hepatopetal flow Other: Possible right pleural effusion IMPRESSION: 1. No significant sonographic abnormality of the liver or gallbladder. 2. Hypoechogenicity seen superior to the right hemidiaphragm is suspicious for pleural effusion. Electronically Signed   By: Acquanetta Belling M.D.   On: 07/06/2022 13:52   CT HEAD WO CONTRAST ( )  Result Date: 07/02/2022 CLINICAL DATA:  Mental status change, unknown cause. EXAM: CT HEAD WITHOUT CONTRAST TECHNIQUE: Contiguous axial images were obtained from the base of the skull through the vertex without intravenous contrast. RADIATION DOSE REDUCTION: This exam was performed according to the departmental dose-optimization program which includes automated exposure control, adjustment of the mA and/or kV according to patient size and/or use of iterative reconstruction technique. COMPARISON:  None Available. FINDINGS: Brain: No acute  intracranial hemorrhage. Gray-white differentiation is preserved. No hydrocephalus or extra-axial collection. No mass effect or midline shift. Vascular: No hyperdense vessel or unexpected calcification. Skull: No calvarial fracture or suspicious bone lesion. Skull base is unremarkable. Sinuses/Orbits: Unremarkable. Other: None. IMPRESSION: No acute intracranial abnormality. Electronically Signed   By: Orvan Falconer M.D.   On: 07/02/2022 20:04   DG Pelvis Comp Min 3V  Result Date: 06/30/2022 CLINICAL DATA:  Status post ORIF acetabular fracture on the left EXAM: JUDET PELVIS - 3+ VIEW COMPARISON:  Intraoperative films from earlier in the same day. FINDINGS: Postsurgical changes are noted along the left acetabulum. Fracture fragments are in anatomic alignment. Proximal left femur appears within normal limits. Changes of prior mesh placement are noted. IMPRESSION: Status post ORIF left acetabular fracture. No alignment abnormalities are noted. Electronically Signed   By: Alcide Clever M.D.   On: 06/30/2022 20:15   DG Pelvis Comp Min 3V  Result Date: 06/30/2022 CLINICAL DATA:  Open reduction and internal fixation of left acetabular fracture. EXAM: JUDET PELVIS - 3+ VIEW; DG C-ARM 1-60 MIN-NO REPORT Radiation exposure index: 3.25 mGy. COMPARISON:  CT scan of same day. FINDINGS: Three intraoperative fluoroscopic images were obtained of the left hip. These demonstrate surgical internal fixation of left acetabular fracture. IMPRESSION: Fluoroscopic guidance provided during surgical internal fixation of left acetabular fracture. Electronically Signed   By: Lupita Raider M.D.   On: 06/30/2022 16:15   DG C-Arm 1-60 Min-No Report  Result Date: 06/30/2022 Fluoroscopy was utilized by the requesting physician.  No radiographic interpretation.   DG C-Arm 1-60 Min-No Report  Result Date: 06/30/2022 Fluoroscopy was utilized by the requesting physician.  No radiographic interpretation.   DG HIP UNILAT WITH PELVIS  1V LEFT  Result Date: 06/30/2022 CLINICAL DATA:  Postoperative status. EXAM: DG HIP (WITH OR WITHOUT PELVIS) 1V*L* COMPARISON:  CT scan of same day. FINDINGS: No dislocation is noted. There appears to be improved alignment involving the posterior acetabular fracture noted on prior CT scan. No other bony abnormality is noted. IMPRESSION: No dislocation is noted. Probable improved alignment involving posterior acetabular fracture on the left. Electronically Signed   By: Lupita Raider M.D.   On: 06/30/2022 08:28   CT Hip Left Wo Contrast  Result Date: 06/30/2022 CLINICAL DATA:  Presented earlier today with left acetabular fracture with superior dislocation, subsequently the dislocation was reduced. EXAM: CT OF THE LEFT HIP WITHOUT CONTRAST TECHNIQUE: Multidetector CT imaging of the left hip was performed according to the standard protocol. Multiplanar CT image reconstructions were also generated. RADIATION DOSE REDUCTION: This exam was performed according to the departmental dose-optimization program which includes automated exposure control, adjustment of the mA and/or kV according to patient size and/or use of iterative reconstruction technique. COMPARISON:  Left hip films before and after left hip dislocation reduction earlier today, CT abdomen and pelvis with IV contrast 08/29/2010. FINDINGS: Bones/Joint/Cartilage There is normal bone mineralization. There is an acute comminuted intra-articular fracture in the mid to outer/superior aspect of the posterior column of the left acetabulum. The main fracture fragment is displaced up to 1 cm posteriorly and mildly anteriorly rotated and is superiorly displaced as well, consistent with the history of the superior hip dislocation. There is bony debris in the joint space and scattered intra-articular gas, small hemarthrosis. Largest intra-articular bone fragment is in the medial central joint space. No breaks in the skin are identified. There are few bone islands in  the left femoral head but no suspicious bone lesions. No other fracture is seen in the visualized bony pelvis. The visualized proximal left femur is intact. The femoral head contour appears maintained. Ligaments Suboptimally assessed by CT. Muscles and Tendons No intramuscular hematoma is seen. Area tendons are not well studied with noncontrast CT but no obvious tendon disruption is suspected. Soft tissues There is a small linear hematoma along the posterior to mid left pelvic side wall. No large hematoma is seen. The prostate and visualized portions and bladder are unremarkable. Since 2012 there has been an interval left inguinal hernia repair. There is concern for retraction of the right testicle up into the inguinal canal. Clinical correlation advised. IMPRESSION: 1. Acute comminuted intra-articular fracture of the posterior column of the left acetabulum with up to 1 cm posterior displacement and mild anterior rotation, and mild superior displacement of the main fracture fragment. 2. Bony debris in the joint space with scattered intra-articular gas, small hemarthrosis. 3. Small linear hematoma along the posterior to mid left pelvic side wall. 4. No other fractures are seen in the visualized bony pelvis or of the proximal left femur. 5. Suspected retraction of the right testicle up into the inguinal canal. Electronically Signed   By: Almira Bar M.D.   On: 06/30/2022 03:15   DG Hip Port [place] W or [place] Pelvis 1 View Left  Result Date: 06/30/2022 CLINICAL DATA:  Left hip reduction. EXAM: DG HIP (WITH OR WITHOUT PELVIS) 1V PORT LEFT COMPARISON:  Jun 30, 2022 (12:12 a.m.) FINDINGS: Acute fracture deformity is seen along the superior lateral aspect of the left acetabulum. There is no evidence of dislocation. There is no evidence of arthropathy or other focal bone abnormality. IMPRESSION: Acute fracture of the left acetabulum with interval reduction of the left hip dislocation seen on the prior study.  Electronically Signed   By: Aram Candela M.D.   On: 06/30/2022 02:24   DG Hip Unilat W or Wo Pelvis 2-3 Views Left  Result Date: 06/30/2022 CLINICAL DATA:  Hip pain after fall. EXAM: DG HIP (WITH OR WITHOUT PELVIS) 2-3V LEFT COMPARISON:  None Available. FINDINGS: There is superior dislocation of the left hip with acetabular fracture and displaced fracture fragment. Is difficult to determine AP positioning, right hip is intact. No additional fracture seen. IMPRESSION: Left acetabular fracture with left  hip dislocation. These results were called by telephone at the time of interpretation on 06/30/2022 at 12:34 am to provider [facility] , who verbally acknowledged these results. Electronically Signed   By: Charlett Nose M.D.   On: 06/30/2022 00:36   LONG TERM MONITOR (3-14 DAYS)  Result Date: 06/23/2022 Patch Wear Time:  9 days and 17 hours (2024-04-22T18:49:59-0400 to 2024-05-02T12:19:32-0400) Patient had a min HR of 57 bpm, max HR of 157 bpm, and avg HR of 83 bpm. Predominant underlying rhythm was Sinus Rhythm. Bundle Branch Block/IVCD was present. QRS morphology changes were present throughout recording. 1 run of Supraventricular Tachycardia  occurred lasting 11.5 secs with a max rate of 141 bpm (avg 122 bpm). Isolated SVEs were rare (<1.0%), SVE Couplets were rare (<1.0%), and SVE Triplets were rare (<1.0%). No Isolated VEs, VE Couplets, or VE Triplets were present. Symptoms associated with sinus rhythm. 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 .     Subjective: Patient was seen and examined at bedside.  Overnight events noted.   Patient report doing much better and wants to be discharged.   Patient has made significant improvement postsurgery with physical therapy.   Initial plan was to CIR but then patient was advised outpatient with therapy.  Discharge Exam: Vitals:   07/09/22 2134 07/10/22 0755  BP: 136/86 (!) 134/90  Pulse: 90 95  Resp:  18 16  Temp: 98.4 F (36.9 C) (!) 97.4 F (36.3 C)  SpO2: 100% 100%   Vitals:   07/09/22 0827 07/09/22 1232 07/09/22 2134 07/10/22 0755  BP: 120/83 (!) 128/90 136/86 (!) 134/90  Pulse: 91 93 90 95  Resp: 18 18 18 16   Temp: 98.1 F (36.7 C) 98.5 F (36.9 C) 98.4 F (36.9 C) (!) 97.4 F (36.3 C)  TempSrc: Oral Oral Oral Oral  SpO2: 99% 98% 100% 100%  Weight:      Height:        General: Pt is alert, awake, not in acute distress Cardiovascular: RRR, S1/S2 +, no rubs, no gallops Respiratory: CTA bilaterally, no wheezing, no rhonchi Abdominal: Soft, NT, ND, bowel sounds + Extremities: no edema, no cyanosis    The results of significant diagnostics from this hospitalization (including imaging, microbiology, ancillary and laboratory) are listed below for reference.     Microbiology: No results found for this or any previous visit (from the past 240 hour(s)).   Labs: BNP (last 3 results) No results for input(s): "BNP" in the last 8760 hours. Basic Metabolic Panel: Recent Labs  Lab 07/05/22 0143 07/06/22 0948 07/07/22 0835 07/08/22 1234 07/09/22 0502 07/10/22 0801  NA 135 136 134* 134*  --  138  K 3.5 4.1 4.2 4.3  --  4.0  CL 103 102 101 101  --  102  CO2 23 23 22  21*  --  22  GLUCOSE 129* 130* 134* 136*  --  124*  BUN 16 12 10 13   --  17  CREATININE 0.93 0.78 0.99 1.03  --  1.00  CALCIUM 9.0 9.3 9.5 9.6  --  9.8  MG 2.1 2.0 1.9  --  2.2 2.0  PHOS 4.8* 3.8 4.4  --  4.9* 4.2   Liver Function Tests: Recent Labs  Lab 07/04/22 0111 07/05/22 0143 07/06/22 0948 07/07/22 0835 07/08/22 1234  AST 24 18 630* 118* 26  ALT 15 15 302* 278* 152*  ALKPHOS 63 62 196* 255* 205*  BILITOT 0.7 0.7 1.0 0.9 0.4  PROT 6.4* 6.4* 6.7  6.9 6.9  ALBUMIN 3.2* 3.0* 3.2* 3.3* 3.3*   No results for input(s): "LIPASE", "AMYLASE" in the last 168 hours. No results for input(s): "AMMONIA" in the last 168 hours. CBC: Recent Labs  Lab 07/04/22 0111 07/05/22 0143 07/06/22 0948  07/07/22 0835  WBC 7.5 7.7 8.1 7.7  NEUTROABS 4.8 4.7 5.7 5.1  HGB 8.1* 7.8* 9.1* 9.5*  HCT 24.3* 23.7* 26.9* 28.1*  MCV 96.8 96.0 94.7 95.3  PLT 317 333 379 411*   Cardiac Enzymes: No results for input(s): "CKTOTAL", "CKMB", "CKMBINDEX", "TROPONINI" in the last 168 hours. BNP: Invalid input(s): "POCBNP" CBG: No results for input(s): "GLUCAP" in the last 168 hours. D-Dimer No results for input(s): "DDIMER" in the last 72 hours. Hgb A1c No results for input(s): "HGBA1C" in the last 72 hours. Lipid Profile No results for input(s): "CHOL", "HDL", "LDLCALC", "TRIG", "CHOLHDL", "LDLDIRECT" in the last 72 hours. Thyroid function studies No results for input(s): "TSH", "T4TOTAL", "T3FREE", "THYROIDAB" in the last 72 hours.  Invalid input(s): "FREET3" Anemia work up No results for input(s): "VITAMINB12", "FOLATE", "FERRITIN", "TIBC", "IRON", "RETICCTPCT" in the last 72 hours. Urinalysis    Component Value Date/Time   BILIRUBINUR 1+ 09/20/2013 1207   PROTEINUR 1+ 09/20/2013 1207   UROBILINOGEN 0.2 09/20/2013 1207   NITRITE neg 09/20/2013 1207   LEUKOCYTESUR Negative 09/20/2013 1207   Sepsis Labs Recent Labs  Lab 07/04/22 0111 07/05/22 0143 07/06/22 0948 07/07/22 0835  WBC 7.5 7.7 8.1 7.7   Microbiology No results found for this or any previous visit (from the past 240 hour(s)).   Time coordinating discharge: Over 30 minutes  SIGNED:   Willeen Niece, MD  [facility] 07/10/2022, 12:45 PM Pager   If 7PM-7AM, please contact night-coverage

## 2022-07-10 NOTE — TOC Progression Note (Signed)
    Durable Medical Equipment  (From admission, onward)           Start     Ordered   07/10/22 1157  For home use only DME Bedside commode  Once       Comments: Confine to one room  Question:  Patient needs a bedside commode to treat with the following condition  Answer:  Fx   07/10/22 1158   07/10/22 1156  For home use only DME Walker rolling  Once       Question Answer Comment  Walker: With 5 Inch Wheels   Patient needs a walker to treat with the following condition Fx      07/10/22 1158

## 2022-07-10 NOTE — Progress Notes (Signed)
Orthopaedic Trauma Service Progress Note  Patient ID: Thomas Williamson MRN: [id number] DOB/AGE: May 10, 1975 47 y.o.  Subjective:  Ortho issues stable He has progressed enough to dc home   Reviewed lots of dc stuff with pt today  Will get smaller PRAFO  Does not recall if [employer] has been up to measure for custom AFO    ROS As above  Objective:   VITALS:   Vitals:   07/09/22 0827 07/09/22 1232 07/09/22 2134 07/10/22 0755  BP: 120/83 (!) 128/90 136/86 (!) 134/90  Pulse: 91 93 90 95  Resp: 18 18 18 16   Temp: 98.1 F (36.7 C) 98.5 F (36.9 C) 98.4 F (36.9 C) (!) 97.4 F (36.3 C)  TempSrc: Oral Oral Oral Oral  SpO2: 99% 98% 100% 100%  Weight:      Height:        Estimated body mass index is 25.62 kg/m as calculated from the following:   Height as of this encounter: 5\' 9"  (1.753 m).   Weight as of this encounter: 78.7 kg.   Intake/Output      06/06 0701 06/07 0700 06/07 0701 06/08 0700   P.O. 350    Total Intake(mL/kg) 350 (4.4)    Urine (mL/kg/hr) 400 (0.2) 350 (0.8)   Total Output 400 350   Net -50 -350          LABS  Results for orders placed or performed during the hospital encounter of 06/30/22 (from the past 24 hour(s))  Basic metabolic panel     Status: Abnormal   Collection Time: 07/10/22  8:01 AM  Result Value Ref Range   Sodium 138 135 - 145 mmol/L   Potassium 4.0 3.5 - 5.1 mmol/L   Chloride 102 98 - 111 mmol/L   CO2 22 22 - 32 mmol/L   Glucose, Bld 124 (H) 70 - 99 mg/dL   BUN 17 6 - 20 mg/dL   Creatinine, Ser 6.21 0.61 - 1.24 mg/dL   Calcium 9.8 8.9 - 30.8 mg/dL   GFR, Estimated >65 >78 mL/min   Anion gap 14 5 - 15  Magnesium     Status: None   Collection Time: 07/10/22  8:01 AM  Result Value Ref Range   Magnesium 2.0 1.7 - 2.4 mg/dL  Phosphorus     Status: None   Collection Time: 07/10/22  8:01 AM  Result Value Ref Range   Phosphorus 4.2 2.5 - 4.6 mg/dL      PHYSICAL EXAM:   Gen: in bed, awake and alert, working with PT Lungs: unlabored  Cardiac: reg Abd: NT, + BS  Ext:       Left Lower Extremity              incision clean and dry              PRAFO is off              Ext warm              + DP pulse             No DCT             Compartments are soft             SPN, TN sensation intact  Diminished DPN sensation             no EHL or ankle extension noted             Ankle and toe flexion intact             + Quad set  Assessment/Plan: 10 Days Post-Op     Anti-infectives (From admission, onward)    Start     Dose/Rate Route Frequency Ordered Stop   06/30/22 2100  ceFAZolin (ANCEF) IVPB 2g/100 mL premix        2 g 200 mL/hr over 30 Minutes Intravenous Every 8 hours 06/30/22 1642 07/01/22 1547   06/30/22 1100  ceFAZolin (ANCEF) IVPB 2g/100 mL premix        2 g 200 mL/hr over 30 Minutes Intravenous On call to O.R. 06/30/22 1005 06/30/22 1300   06/30/22 1008  ceFAZolin (ANCEF) 2-4 GM/100ML-% IVPB       Note to Pharmacy: Jamelle Rushing, GRETA: cabinet override      06/30/22 1008 06/30/22 1315     .  POD/HD#:   47 year old male s/p with left acetabulum fracture dislocation   -Closed left transverse posterior wall acetabular fracture dislocation with foot drop s/p ORIF L acetabulum Weightbearing Touchdown weightbearing left leg using crutches or walker.  Will advance weightbearing in about 8 weeks               ROM/Activity                         Posterior hip precautions for 12 weeks.  Activity as tolerated while maintaining weightbearing restrictions and range of motion precautions.               Wound care                         ok to leave wound open to air                          Ok to clean with soap and water   Sutures out in a week    PT and OT   XRT for heterotopic ossification prophylaxis has been completed    I placed order for [employer] to start the process for AFO for his foot drop.  We  discussed potential long recovery process for this.   - Pain management:             Multimodal   - ABL anemia/Hemodynamics             stable   - Medical issues              Per primary   - DVT/PE prophylaxis:             Lovenox and SCDs while inpatient             Discharge on Eliquis 2.5 mg p.o. 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  Labs show vitamin D deficiency.  Supplement.   + testosterone deficiency    Like related to chronic alcohol usse   - Activity:             As above   -  Impediments to fracture healing:             Chronic alcohol use             Nicotine dependence             Regular marijuana use             Vitamin D deficiency   - Dispo:             Ortho issues stable Sutures out in another week Ok to dc from ortho standpoint Follow up with ortho in 1 week   Mearl Latin, PA-C [phone] (C) 07/10/2022, 12:56 PM  [facility] [address] [place] [address] [phone] Val [phone] (F)    After 5pm and on the weekends please log on to Amion, go to orthopaedics and the look under the [facility] Call for the provider(s) on call. You can also call our office at [phone] and then follow the prompts to be connected to the call team.  Patient ID: Thomas Williamson, male   DOB: [birth year], 47 y.o.   MRN: [id number]

## 2022-07-10 NOTE — Progress Notes (Signed)
  Radiation Oncology         ([phone] ________________________________  Name: Thomas Williamson MRN: [id number]  Date: 07/01/2022  DOB: 12-05-1975  End of Treatment Note  Diagnosis:   Postoperative heterotopic ossification     Indication for treatment:  curative       Radiation treatment dates:   07/01/22  Site/dose:   The patient was treated to a dose of 7 Gy to the left hip. This was accomplished with AP and PA fields.  Narrative: The patient tolerated radiation treatment relatively well.   No difficulties.  Plan: The patient has completed radiation treatment. The patient will return to radiation oncology clinic on an as needed basis. ________________________________    Osker Mason, PAC

## 2022-07-10 NOTE — TOC Transition Note (Addendum)
Transition of Care [facility]) - CM/SW Discharge Note   Patient Details  Name: Thomas Williamson MRN: [id number] Date of Birth: 02-24-1975  [facility]) CM/SW Contact:  Epifanio Lesches, RN Phone Number: 07/10/2022, 12:16 PM   Clinical Narrative:    Patient will DC to: Home Anticipated DC date: 07/10/2022 Family notified: yes Transport by: car       - s/p ORIFFIXATION OF RIGHT TRANSVERSE POSTERIOR WALL ACETABULAR FRACTURE  Per MD patient ready for DC today. RN, patient, and patient's family ( MOM/DAD) aware of DC plan. Orders noted for home health services. Pt agreeable, however, NCM unable to secure home health provider 2/2 out of network vs no staff. Pt agreeable to outpatient service. Referral made with [facility] outpatient rehab. Center and noted on AVS.  Referral made with [facility] for RW and BSC. Equipment will be delivered to bedside prior to d/c. Pt instructed to f/u with a call to arrange appointment time. Pt without RX med concerns. Post hospital f/u noted on AVS.  Family to provide transportation to home.   RNCM will sign off for now as intervention is no longer needed. Please consult [name] again if new needs arise.    Final next level of care: Home/Self Care Barriers to Discharge: No Barriers Identified   Patient Goals and CMS Choice   Choice offered to / list presented to : Patient  Discharge Placement                         Discharge Plan and Services Additional resources added to the After Visit Summary for                  DME Arranged: Bedside commode, Walker rolling DME Agency: [facility] Date DME Agency Contacted: 07/10/22 Time DME Agency Contacted: 1202 Representative spoke with at DME Agency: Marthann Schiller            Social Determinants of Health (SDOH) Interventions SDOH Screenings   Food Insecurity: No Food Insecurity (06/15/2022)  Housing: Low Risk  (01/08/2022)  Transportation Needs: No Transportation Needs (06/15/2022)  Utilities: Not At  Risk (01/08/2022)  Alcohol Screen: Medium Risk (07/11/2018)  Depression (PHQ2-9): Low Risk  (01/08/2022)  Recent Concern: Depression (PHQ2-9) - Medium Risk (12/05/2021)  Financial Resource Strain: Medium Risk (02/26/2018)  Physical Activity: Inactive (12/10/2016)  Social Connections: Unknown (02/26/2018)  Stress: Stress Concern Present (12/10/2016)  Tobacco Use: High Risk (07/03/2022)     Readmission Risk Interventions     No data to display

## 2022-07-10 NOTE — Discharge Instructions (Addendum)
Advised to follow-up with primary care physician in 1 week. Advised to follow-up with orthopedics in 1 week. Advised to follow-up with gastroenterology in 3 to 4 months for HCV. Outpatient physical therapy has been recommended.  Information on my medicine - ELIQUIS (apixaban)  Why was Eliquis prescribed for you? Eliquis was prescribed for you to reduce the risk of blood clots forming after orthopedic surgery.    What do You need to know about Eliquis? Take your Eliquis TWICE DAILY - one tablet in the morning and one tablet in the evening with or without food.  It would be best to take the dose about the same time each day.  If you have difficulty swallowing the tablet whole please discuss with your pharmacist how to take the medication safely.  Take Eliquis exactly as prescribed by your doctor and DO NOT stop taking Eliquis without talking to the doctor who prescribed the medication.  Stopping without other medication to take the place of Eliquis may increase your risk of developing a clot.  After discharge, you should have regular check-up appointments with your healthcare provider that is prescribing your Eliquis.  What do you do if you miss a dose? If a dose of ELIQUIS is not taken at the scheduled time, take it as soon as possible on the same day and twice-daily administration should be resumed.  The dose should not be doubled to make up for a missed dose.  Do not take more than one tablet of ELIQUIS at the same time.  Important Safety Information A possible side effect of Eliquis is bleeding. You should call your healthcare provider right away if you experience any of the following: Bleeding from an injury or your nose that does not stop. Unusual colored urine (red or dark brown) or unusual colored stools (red or black). Unusual bruising for unknown reasons. A serious fall or if you hit your head (even if there is no bleeding).  Some medicines may interact with Eliquis  and might increase your risk of bleeding or clotting while on Eliquis. To help avoid this, consult your healthcare provider or pharmacist prior to using any new prescription or non-prescription medications, including herbals, vitamins, non-steroidal anti-inflammatory drugs (NSAIDs) and supplements.  This website has more information on Eliquis (apixaban): http://www.eliquis.com/eliquis/home     Orthopaedic Trauma Service Discharge Instructions   General Discharge Instructions  Orthopaedic Injuries:  Left acetabulum fracture dislocation treated with open reduction and internal fixation with plate and screws.  You also have a left foot drop related to your fracture dislocation.  We will continue to monitor recovery of your nerve.  WEIGHT BEARING STATUS: Touchdown weightbearing left leg for 8 weeks and then graduated weightbearing thereafter with the expectation that he will be full weightbearing around 12 weeks from surgery.  Use walker or crutches to mobilize  RANGE OF MOTION/ACTIVITY: Posterior hip precautions left hip.  These precautions will be in place for 12 weeks  Bone health: Labs show vitamin D deficiency.  Please take 5000 IUs of vitamin D3 daily  Review the following resource for additional information regarding bone health  BluetoothSpecialist.com.cy  Wound Care: Leave incision open to air.  Clean with soap and water only.  Do not apply any lotions or ointments or solutions  DVT/PE prophylaxis: Eliquis 2.5 mg by mouth every 12 hours for 30 days for blood clot prevention  Diet: as you were eating previously.  Can use over the counter stool softeners and bowel preparations, such as Miralax, to help with  bowel movements.  Narcotics can be constipating.  Be sure to drink plenty of fluids  PAIN MEDICATION USE AND EXPECTATIONS  You have likely been given narcotic medications to help control your pain.  After a traumatic event that results in an fracture (broken  bone) with or without surgery, it is ok to use narcotic pain medications to help control one's pain.  We understand that everyone responds to pain differently and each individual patient will be evaluated on a regular basis for the continued need for narcotic medications. Ideally, narcotic medication use should last no more than 6-8 weeks (coinciding with fracture healing).   As a patient it is your responsibility as well to monitor narcotic medication use and report the amount and frequency you use these medications when you come to your office visit.   We would also advise that if you are using narcotic medications, you should take a dose prior to therapy to maximize you participation.  IF YOU ARE ON NARCOTIC MEDICATIONS IT IS NOT PERMISSIBLE TO OPERATE A MOTOR VEHICLE (MOTORCYCLE/CAR/TRUCK/MOPED) OR HEAVY MACHINERY DO NOT MIX NARCOTICS WITH OTHER CNS (CENTRAL NERVOUS SYSTEM) DEPRESSANTS SUCH AS ALCOHOL   POST-OPERATIVE OPIOID TAPER INSTRUCTIONS: It is important to wean off of your opioid medication as soon as possible. If you do not need pain medication after your surgery it is ok to stop day one. Opioids include: Codeine, Hydrocodone(Norco, Vicodin), Oxycodone(Percocet, oxycontin) and hydromorphone amongst others.  Long term and even short term use of opiods can cause: Increased pain response Dependence Constipation Depression Respiratory depression And more.  Withdrawal symptoms can include Flu like symptoms Nausea, vomiting And more Techniques to manage these symptoms Hydrate well Eat regular healthy meals Stay active Use relaxation techniques(deep breathing, meditating, yoga) Do Not substitute Alcohol to help with tapering If you have been on opioids for less than two weeks and do not have pain than it is ok to stop all together.  Plan to wean off of opioids This plan should start within one week post op of your fracture surgery  Maintain the same interval or time between  taking each dose and first decrease the dose.  Cut the total daily intake of opioids by one tablet each day Next start to increase the time between doses. The last dose that should be eliminated is the evening dose.    STOP SMOKING OR USING NICOTINE PRODUCTS!!!!  As discussed nicotine severely impairs your body's ability to heal surgical and traumatic wounds but also impairs bone healing.  Wounds and bone heal by forming microscopic blood vessels (angiogenesis) and nicotine is a vasoconstrictor (essentially, shrinks blood vessels).  Therefore, if vasoconstriction occurs to these microscopic blood vessels they essentially disappear and are unable to deliver necessary nutrients to the healing tissue.  This is one modifiable factor that you can do to dramatically increase your chances of healing your injury.    (This means no smoking, no nicotine gum, patches, etc)  DO NOT USE NONSTEROIDAL ANTI-INFLAMMATORY DRUGS (NSAID'S)  Using products such as Advil (ibuprofen), Aleve (naproxen), Motrin (ibuprofen) for additional pain control during fracture healing can delay and/or prevent the healing response.  If you would like to take over the counter (OTC) medication, Tylenol (acetaminophen) is ok.  However, some narcotic medications that are given for pain control contain acetaminophen as well. Therefore, you should not exceed more than 4000 mg of tylenol in a day if you do not have liver disease.  Also note that there are may OTC medicines, such as  cold medicines and allergy medicines that my contain tylenol as well.  If you have any questions about medications and/or interactions please ask your doctor/PA or your pharmacist.      ICE AND ELEVATE INJURED/OPERATIVE EXTREMITY  Using ice and elevating the injured extremity above your heart can help with swelling and pain control.  Icing in a pulsatile fashion, such as 20 minutes on and 20 minutes off, can be followed.    Do not place ice directly on skin. Make  sure there is a barrier between to skin and the ice pack.    Using frozen items such as frozen peas works well as the conform nicely to the are that needs to be iced.  USE AN ACE WRAP OR TED HOSE FOR SWELLING CONTROL  In addition to icing and elevation, Ace wraps or TED hose are used to help limit and resolve swelling.  It is recommended to use Ace wraps or TED hose until you are informed to stop.    When using Ace Wraps start the wrapping distally (farthest away from the body) and wrap proximally (closer to the body)   Example: If you had surgery on your leg or thing and you do not have a splint on, start the ace wrap at the toes and work your way up to the thigh        If you had surgery on your upper extremity and do not have a splint on, start the ace wrap at your fingers and work your way up to the upper arm  IF YOU ARE IN A SPLINT OR CAST DO NOT REMOVE IT FOR ANY REASON   If your splint gets wet for any reason please contact the office immediately. You may shower in your splint or cast as long as you keep it dry.  This can be done by wrapping in a cast cover or garbage back (or similar)  Do Not stick any thing down your splint or cast such as pencils, money, or hangers to try and scratch yourself with.  If you feel itchy take benadryl as prescribed on the bottle for itching  IF YOU ARE IN A CAM BOOT (BLACK BOOT)  You may remove boot periodically. Perform daily dressing changes as noted below.  Wash the liner of the boot regularly and wear a sock when wearing the boot. It is recommended that you sleep in the boot until told otherwise    Call office for the following: Temperature greater than 101F Persistent nausea and vomiting Severe uncontrolled pain Redness, tenderness, or signs of infection (pain, swelling, redness, odor or green/yellow discharge around the site) Difficulty breathing, headache or visual disturbances Hives Persistent dizziness or light-headedness Extreme fatigue Any  other questions or concerns you may have after discharge  In an emergency, call 911 or go to an Emergency Department at a nearby hospital  HELPFUL INFORMATION  If you had a block, it will wear off between 8-24 hrs postop typically.  This is period when your pain may go from nearly zero to the pain you would have had postop without the block.  This is an abrupt transition but nothing dangerous is happening.  You may take an extra dose of narcotic when this happens.  You should wean off your narcotic medicines as soon as you are able.  Most patients will be off or using minimal narcotics before their first postop appointment.   We suggest you use the pain medication the first night prior to going to  bed, in order to ease any pain when the anesthesia wears off. You should avoid taking pain medications on an empty stomach as it will make you nauseous.  Do not drink alcoholic beverages or take illicit drugs when taking pain medications.  In most states it is against the law to drive while you are in a splint or sling.  And certainly against the law to drive while taking narcotics.  You may return to work/school in the next couple of days when you feel up to it.   Pain medication may make you constipated.  Below are a few solutions to try in this order: Decrease the amount of pain medication if you aren't having pain. Drink lots of decaffeinated fluids. Drink prune juice and/or each dried prunes  If the first 3 don't work start with additional solutions Take Colace - an over-the-counter stool softener Take Senokot - an over-the-counter laxative Take Miralax - a stronger over-the-counter laxative     CALL THE OFFICE WITH ANY QUESTIONS OR CONCERNS: [phone]   VISIT OUR WEBSITE FOR ADDITIONAL INFORMATION: [email or web address]

## 2022-07-10 NOTE — Progress Notes (Signed)
Orthopedic Tech Progress Note Patient Details:  Thomas Williamson Apr 15, 1975 [id number]  Patient ID: Thomas Williamson, male   DOB: Aug 07, 1975, 47 y.o.   MRN: [id number] Order placed with [employer] for medium prafo. Darleen Crocker 07/10/2022, 1:04 PM

## 2022-07-13 ENCOUNTER — Telehealth: Payer: Self-pay

## 2022-07-13 ENCOUNTER — Telehealth: Payer: Self-pay | Admitting: Family Medicine

## 2022-07-13 MED ORDER — OXYCODONE HCL 5 MG PO TABS: 5.0000 mg | ORAL_TABLET | Freq: Four times a day (QID) | ORAL | 0 refills | Status: AC | PRN

## 2022-07-13 NOTE — Transitions of Care (Post Inpatient/ED Visit) (Signed)
   07/13/2022  Name: Thomas Williamson MRN: [id number] DOB: September 11, 1975  Today's TOC FU Call Status: Today's TOC FU Call Status:: Unsuccessul Call (1st Attempt) Unsuccessful Call (1st Attempt) Date: 07/13/22  Attempted to reach the patient regarding the most recent Inpatient/ED visit.  Follow Up Plan: Additional outreach attempts will be made to reach the patient to complete the Transitions of Care (Post Inpatient/ED visit) call.     Antionette Fairy, RN,BSN,CCM [facility] Care Management Care Management Community Coordinator Direct Phone: [phone] Toll Free: [phone] Fax: [phone]

## 2022-07-13 NOTE — Telephone Encounter (Signed)
I sent in #20 more Oxycodone tablets. This will have to last until he sees me on 07-20-22.

## 2022-07-13 NOTE — Patient Outreach (Addendum)
  Care Coordination   Follow Up Visit Note   07/13/2022 Name: Hyun Marsalis MRN: [id number] DOB: 05-Apr-1975  Incoming call from patient reporting that he called [facility] outpt rehab at [phone] and was told they didn't have any orders for this therapy. Advised patient that per inpatient TOC note orders had been placed and RN CM would follow up. RN CM reached out to rehab center. Spoke with Inocencio Homes who confirms that orders were received. However, due to pt having hip fracture he will be going to the [facility] rehab at [phone]. Relayed this info to patient and he will contact [facility]. Location to arrange appt. Advised patient to call back if he has any other issues/concerns. He voiced understanding.   Care Coordination Interventions:  Yes, provided    Interventions Today    Flowsheet Row Most Recent Value  General Interventions   General Interventions Discussed/Reviewed Communication with  Communication with --  [contacted [facility] outpt rehab]        Follow up plan: No further intervention required.   Encounter Outcome:  Pt. Visit Completed   Alessandra Grout [facility] Care Management Care Management Community Coordinator Direct Phone: [phone] Toll Free: [phone] Fax: [phone]

## 2022-07-13 NOTE — Telephone Encounter (Signed)
Pt call and stated he have a  hospital f/u appt with dr.Fry  on the 6/17 and want to know if he will call him in a RX for some Oxcodone and some muscle relaxer the name is Methocarbamol to hold him over until his appt.

## 2022-07-13 NOTE — Transitions of Care (Post Inpatient/ED Visit) (Signed)
07/13/2022  Name: Thomas Williamson MRN: [id number] DOB: 1975/06/10  Today's TOC FU Call Status: Today's TOC FU Call Status:: Successful TOC FU Call Competed TOC FU Call Complete Date: 07/13/22 (Incoming call from pt returning RN CM call.)  Transition Care Management Follow-up Telephone Call Date of Discharge: 07/10/22 Discharge Facility: [facility]) Type of Discharge: Inpatient Admission Primary Inpatient Discharge Diagnosis:: "hip fracture" How have you been since you were released from the hospital?: Better (Pt states that so far things going well. He has been up walking with walker. Pain is controlled at present.) Any questions or concerns?: Yes Patient Questions/Concerns:: pateitn wanting to know about getting refill on pain meds and voices muscle relaxer was $1100-so he did not get it Patient Questions/Concerns Addressed: Other: (Advisd pt to contact surgeon office to discuss refill on pain med, advise of need for another cheaper muscle relaxer script and to make f/u appt-Patient will calll office ASAP.)  Items Reviewed: Did you receive and understand the discharge instructions provided?: Yes Medications obtained,verified, and reconciled?: Yes (Medications Reviewed) (Pt reports pharmacy was out of stock of Thamine and was told med should be available for pick up today. He will follow up with pharmacy.) Any new allergies since your discharge?: No Dietary orders reviewed?: Yes Type of Diet Ordered:: low salt/heart healthy Do you have support at home?: Yes People in Home: parent(s) Name of Support/Comfort Primary Source: pt voices he is staying with his parents while he recovers  Medications Reviewed Today: Medications Reviewed Today     Reviewed by Charlyn Minerva, RN (Registered Nurse) on 07/13/22 at 1020  Med List Status: <None>   Medication Order Taking? Sig Documenting Provider Last Dose Status Informant  amLODipine (NORVASC) 10 MG tablet [id number] Yes  Take 1 tablet (10 mg total) by mouth daily. Nelwyn Salisbury, MD Taking Active Self, Pharmacy Records  apixaban [facility]) 2.5 MG TABS tablet [id number] Yes Take 1 tablet (2.5 mg total) by mouth 2 (two) times daily. Willeen Niece, MD Taking Active   buPROPion (WELLBUTRIN XL) 300 MG 24 hr tablet [id number] Yes Take 1 tablet (300 mg total) by mouth daily. Patrick North, MD Taking Active Self, Pharmacy Records  busPIRone (BUSPAR) 5 MG tablet [id number] Yes Take 1 tablet (5 mg total) by mouth 2 (two) times daily. Nelwyn Salisbury, MD Taking Active Self, Pharmacy Records  cyanocobalamin 1000 MCG tablet [id number] Yes Take 1 tablet (1,000 mcg total) by mouth daily. Willeen Niece, MD Taking Active   docusate sodium (COLACE) 100 MG capsule [id number] Yes Take 100 mg by mouth daily. [provider] Taking Active Self  FLUoxetine (PROZAC) 20 MG capsule [id number] Yes Take 20 mg by mouth daily. [provider] Taking Active Self, Pharmacy Records  folic acid (FOLVITE) 1 MG tablet [id number] Yes Take 1 tablet (1 mg total) by mouth daily. Nelwyn Salisbury, MD Taking Active Self, Pharmacy Records  gabapentin (NEURONTIN) 100 MG capsule [id number] Yes Take 100 mg by mouth 2 (two) times daily. [provider] Taking Active Self, Pharmacy Records  LORazepam (ATIVAN) 1 MG tablet [id number] Yes Take 1 tablet (1 mg total) by mouth every 8 (eight) hours as needed for anxiety. Nelwyn Salisbury, MD Taking Active Self, Pharmacy Records  losartan-hydrochlorothiazide [facility]) 100-25 MG tablet [id number] Yes TAKE 1 TABLET BY MOUTH EVERY DAY Nelwyn Salisbury, MD Taking Active Self, Pharmacy Records  Magnesium Oxide 400 MG CAPS [id number] Yes Take 1 capsule (400 mg total) by mouth 2 (two) times daily. Gershon Crane  A, MD Taking Active Self, Pharmacy Records  methocarbamol 1000 MG TABS [id number]  Take 1,000 mg by mouth every 6 (six) hours as needed for up to 10 days for muscle spasms. Willeen Niece, MD  Active    metoprolol succinate (TOPROL-XL) 25 MG 24 hr tablet [id number] Yes Take 1 tablet (25 mg total) by mouth daily. Nelwyn Salisbury, MD Taking Active Self, Pharmacy Records  naltrexone (DEPADE) 50 MG tablet [id number] Yes Take 50 mg by mouth daily. [provider] Taking Active Self, Pharmacy Records  Omeprazole 20 MG TBEC [id number] Yes Take 20 mg by mouth daily.  [provider] Taking Active Self, Pharmacy Records  oxyCODONE (OXY IR/ROXICODONE) 5 MG immediate release tablet [id number] Yes Take 1 tablet (5 mg total) by mouth every 6 (six) hours as needed for up to 5 days for breakthrough pain or severe pain ((for MODERATE breakthrough pain)). Willeen Niece, MD Taking Active   polyethylene glycol (MIRALAX / GLYCOLAX) 17 g packet [id number] Yes Take 17 g by mouth daily. [provider] Taking Active Self  potassium chloride (KLOR-CON M) 10 MEQ tablet [id number] Yes Take 1 tablet (10 mEq total) by mouth daily. Nelwyn Salisbury, MD Taking Active Self, Pharmacy Records  pravastatin (PRAVACHOL) 40 MG tablet [id number] Yes TAKE 1 TABLET BY MOUTH EVERY DAY Nelwyn Salisbury, MD Taking Active Self, Pharmacy Records  thiamine (VITAMIN B-1) 100 MG tablet [id number]  Take 1 tablet (100 mg total) by mouth daily. Willeen Niece, MD  Active             Home Care and Equipment/Supplies: Were Home Health Services Ordered?: NA (pt set up with [facility] outpt rehab/therapy-he will call to follow up on when therapy begins) Any new equipment or medical supplies ordered?: Yes Name of Medical supply agency?: [employer] Were you able to get the equipment/medical supplies?: Yes Do you have any questions related to the use of the equipment/supplies?: No  Functional Questionnaire: Do you need assistance with bathing/showering or dressing?: No Do you need assistance with meal preparation?: Yes Do you need assistance with eating?: No Do you need assistance with getting out of bed/getting out of a  chair/moving?: No Do you have difficulty managing or taking your medications?: No  Follow up appointments reviewed: PCP Follow-up appointment confirmed?: Yes Date of PCP follow-up appointment?: 07/20/22 Follow-up Provider: Dr. Clent Ridges Specialist [facility] Follow-up appointment confirmed?: No Reason Specialist Follow-Up Not Confirmed: Patient has Specialist Provider Number and will Call for Appointment (pt will call ortho surgeon office today to make an appt) Do you need transportation to your follow-up appointment?: No (pt confirms his parents will take him to appts) Do you understand care options if your condition(s) worsen?: Yes-patient verbalized understanding  SDOH Interventions Today    Flowsheet Row Most Recent Value  SDOH Interventions   Food Insecurity Interventions Intervention Not Indicated  Transportation Interventions Intervention Not Indicated      TOC Interventions Today    Flowsheet Row Most Recent Value  TOC Interventions   TOC Interventions Discussed/Reviewed TOC Interventions Discussed, Post discharge activity limitations per provider, Post op wound/incision care, Arranged PCP follow up less than 12 days/Care Guide scheduled      Interventions Today    Flowsheet Row Most Recent Value  General Interventions   General Interventions Discussed/Reviewed General Interventions Discussed, Doctor Visits  Doctor Visits Discussed/Reviewed Doctor Visits Discussed, Specialist, PCP  PCP/Specialist Visits Compliance with follow-up visit  Education Interventions   Education Provided Provided Education  Provided Verbal Education On Nutrition,  When to see the doctor, Medication, Other  [pain mgmt/bowel regimen]  Nutrition Interventions   Nutrition Discussed/Reviewed Nutrition Discussed, Adding fruits and vegetables, Decreasing salt  Pharmacy Interventions   Pharmacy Dicussed/Reviewed Pharmacy Topics Discussed, Medications and their functions  Safety Interventions   Safety  Discussed/Reviewed Safety Discussed, Home Safety  Home Safety Assistive Devices       [place], [place] [facility] Care Management Care Management Community Coordinator Direct Phone: [phone] Toll Free: [phone] Fax: [phone]

## 2022-07-14 ENCOUNTER — Ambulatory Visit: Payer: BC Managed Care – PPO | Attending: Orthopedic Surgery

## 2022-07-14 DIAGNOSIS — S72009A Fracture of unspecified part of neck of unspecified femur, initial encounter for closed fracture: Secondary | ICD-10-CM | POA: Insufficient documentation

## 2022-07-14 DIAGNOSIS — M6281 Muscle weakness (generalized): Secondary | ICD-10-CM | POA: Diagnosis not present

## 2022-07-14 DIAGNOSIS — M25552 Pain in left hip: Secondary | ICD-10-CM | POA: Insufficient documentation

## 2022-07-14 DIAGNOSIS — R262 Difficulty in walking, not elsewhere classified: Secondary | ICD-10-CM | POA: Insufficient documentation

## 2022-07-14 NOTE — Telephone Encounter (Signed)
Pt notified via My Chart

## 2022-07-14 NOTE — Therapy (Signed)
OUTPATIENT PHYSICAL THERAPY LOWER EXTREMITY EVALUATION   Patient Name: Thomas Williamson MRN: [id number] DOB:1975/03/16, 47 y.o., male Today's Date: 07/14/2022  END OF SESSION:  PT End of Session - 07/14/22 1250     Visit Number 1    Number of Visits 25    Date for PT Re-Evaluation 10/06/22    PT Start Time 1300    PT Stop Time 1350    PT Time Calculation (min) 50 min    Equipment Utilized During Treatment Gait belt    Activity Tolerance Patient tolerated treatment well    Behavior During Therapy WFL for tasks assessed/performed             Past Medical History:  Diagnosis Date   Acetabulum fracture, left (HCC) 06/30/2022   Alcohol use disorder, severe, in early remission (HCC) 03/30/2018   Allergy    Anxiety    Bipolar disorder, in partial remission, most recent episode hypomanic (HCC) 03/30/2018   [facility]   Bleeding internal hemorrhoids 09/12/2013   Cannabis dependence (HCC) 07/13/2018   Daily use   Cervical disc disease 11/20/2014   Chronic anxiety 07/13/2018   Chronic systolic (congestive) heart failure (HCC)    Complication of anesthesia    Depression    Dyslipidemia    GERD (gastroesophageal reflux disease)    Hernia, inguinal, bilateral 02/24/2011   High ankle sprain of right lower extremity 01/16/2015   History of hiatal hernia    Hx of hepatitis C 10/05/2017   -treated in 2019 -hepatology recommended no further surveillance needed except for LFTs with labs and see hepatologist if elevated   Hypertension    Lipids abnormal 09/29/2013   Pneumonia    PONV (postoperative nausea and vomiting)    Vitamin D deficiency 07/01/2022   Past Surgical History:  Procedure Laterality Date   ANTERIOR CRUCIATE LIGAMENT REPAIR  2010   BIOPSY  03/13/2019   Procedure: BIOPSY;  Surgeon: Benancio Deeds, MD;  Location: [facility];  Service: Gastroenterology;;   COLONOSCOPY N/A 03/13/2019   per Dr. Adela Lank, adenomatous polyps,  repeat in 3 yrs   INGUINAL HERNIA REPAIR  02/23/2012   Procedure: LAPAROSCOPIC BILATERAL INGUINAL HERNIA REPAIR;  Surgeon: Kandis Cocking, MD;  Location: [facility];  Service: General;  Laterality: Bilateral;  Laparoscopic Bilateral Inguinal Hernia Repair with mesh   INSERTION OF MESH  02/23/2012   Procedure: INSERTION OF MESH;  Surgeon: Kandis Cocking, MD;  Location: [facility];  Service: General;  Laterality: N/A;   NOSE SURGERY  [id number]   ORIF ACETABULAR FRACTURE Left 06/30/2022   Procedure: OPEN REDUCTION INTERNAL FIXATION (ORIF) ACETABULAR FRACTURE;  Surgeon: Myrene Galas, MD;  Location: [facility];  Service: Orthopedics;  Laterality: Left;   POLYPECTOMY  03/13/2019   Procedure: POLYPECTOMY;  Surgeon: Benancio Deeds, MD;  Location: [facility];  Service: Gastroenterology;;   Patient Active Problem List   Diagnosis Date Noted   Abnormal LFTs 07/08/2022   Left foot drop 07/01/2022   Vitamin D deficiency 07/01/2022   Acetabulum fracture, left (HCC) 06/30/2022   Alcohol dependence (HCC) 01/05/2022   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   Alcohol-induced acute pancreatitis without infection or necrosis 12/05/2021   Genetic testing 04/21/2019   Benign neoplasm of colon    Loose stools    History of colonic polyps 03/07/2019   Cardiac left ventricular ejection fraction 30-35 percent 03/07/2019   Alcoholic ketoacidosis 11/18/2018   Dyslipidemia 09/16/2018   Fever blister 09/16/2018   Cardiomyopathy,  alcoholic (HCC) 09/16/2018   Left bundle branch block (LBBB) 09/16/2018   Cannabis dependence (HCC) 07/13/2018   Chronic anxiety 07/13/2018   Bipolar 1 disorder, depressed, full remission (HCC) 03/30/2018   Cervical disc disease 11/20/2014   Hypertension 02/24/2011    PCP: Nelwyn Salisbury, MD  REFERRING PROVIDER: Myrene Galas, MD  REFERRING DIAG: S72.009A (ICD-10-CM) - Hip fracture [facility])  THERAPY DIAG:  Muscle weakness (generalized)  Difficulty in walking, not  elsewhere classified  Pain in left hip  Rationale for Evaluation and Treatment: Rehabilitation  ONSET DATE: 06/30/22  SUBJECTIVE:   SUBJECTIVE STATEMENT: Pt is a 47 y.o. male referred to OPPT s/p L acetabular fracture and hip dislocation with surgical repair on 06/30/22.   PERTINENT HISTORY: Pt is a pleasant 47 y.o. male s/p L acetabular fracture repair and hip dislocation after fall walking dog down stairs. Pt with subsequent LLE foot drop still since operation on 06/30/22. Reports managing well at home, just wants to be able to learn how to asc/desc steps and return to independence. Currently unemployed but has a job lined up for when he recovers that will require regular standing/walking, helping people perform ADL's. Denies falls since being home. Worst pain 8/10 NPS in L hip at surgical site. Reports burning pain along deep peroneal nerve distribution starting at fibular head traveling along lateral aspect of lower leg to dorsal part of foot. Pt with hypersensitivity to light touch and tenderness at fibular head.  PAIN:  Are you having pain? Yes: NPRS scale: 8/10 Pain location: L hip surgical site Pain description: burning in foot,  Aggravating factors: some LLE mobility but tolerable, touching L foot.  Relieving factors: Medication,    PRECAUTIONS: Other: TDWB with LLE (, posterior hip precautions   WEIGHT BEARING RESTRICTIONS: Yes TDWB 8 weeks from 06/30/22, post hip precautions 12 weeks from 06/30/22  FALLS:  Has patient fallen in last 6 months? No  LIVING ENVIRONMENT: Lives with: lives with their family Lives in: House/apartment Stairs: Yes: Internal: flight steps; on right going up and External: 1 steps; none Has following equipment at home: Walker - 2 wheeled and bed side commode  OCCUPATION: unemployed before injury. 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 Reports needing to be able to be able to walk, help with group home ADL's.   PLOF:  Independent  PATIENT GOALS: Return to independent  NEXT MD VISIT: PCP, 07/20/22 and Dr. Carola Frost on 07/27/22  OBJECTIVE:   DIAGNOSTIC FINDINGS:  CLINICAL DATA:  Postoperative status.   EXAM: DG HIP (WITH OR WITHOUT PELVIS) 1V*L*   COMPARISON:  CT scan of same day.   FINDINGS: No dislocation is noted. There appears to be improved alignment involving the posterior acetabular fracture noted on prior CT scan. No other bony abnormality is noted.   IMPRESSION: No dislocation is noted. Probable improved alignment involving posterior acetabular fracture on the left.     Electronically Signed   By: Lupita Raider M.D.  PATIENT SURVEYS:  FOTO Deferred to next session  COGNITION: Overall cognitive status: Within functional limits for tasks assessed     SENSATION: Light touch: Impaired  L4-L5 on L foot (great toe, medial malleolus)    POSTURE: No Significant postural limitations  PALPATION: TTP along LLE fibular head along deep peroneal nerve to dorsum of foot  LOWER EXTREMITY ROM:  Active ROM Right eval Left eval  Hip flexion  90  Hip extension    Hip abduction  Hip adduction    Hip internal rotation    Hip external rotation 40 28  Knee flexion    Knee extension 0 0  Ankle dorsiflexion 10 Unable due to L foot drop  Ankle plantarflexion 55 42  Ankle inversion 35 20  Ankle eversion 20 unable   (Blank rows = not tested)  LOWER EXTREMITY MMT:  MMT Right eval Left eval  Hip flexion 5 5  Hip extension    Hip abduction 5 4  Hip adduction 5 5  Hip internal rotation    Hip external rotation    Knee flexion 5 5  Knee extension 5 5  Ankle dorsiflexion 5 0  Ankle plantarflexion 5 5  Ankle inversion 5 4  Ankle eversion 5 0   (Blank rows = not tested)  LOWER EXTREMITY SPECIAL TESTS:  N/A FUNCTIONAL TESTS:  5 times sit to stand: 11.33 sec, needs BUE support, TDWB on LLE Timed up and go (TUG): 13.95 seconds with RW 10 meter walk test: .6 m/s using RW and TDWB  on LLE   GAIT: Distance walked: 10 meters Assistive device utilized: Walker - 2 wheeled Level of assistance: Modified independence Comments: LLE TTWB using step to pattern with RW.   TODAY'S TREATMENT:                                                                                                                              DATE: 07/14/22   Education on importance of L ankle mobility in case pt will need AFO in the future for L foot drop to prevent gastrocnemius contracture. Reviewed post hip precautions and TDWB precautions and importance to comply for tissue healing. Educated to continue acute PT exercises given for LLE strengthening and AROM.  PATIENT EDUCATION:  Education details: Education on importance of L ankle mobility in case pt will need AFO in the future for L foot drop to prevent gastrocnemius contracture. Reviewed post hip precautions and TDWB precautions and importance to comply for tissue healing.  Person educated: Patient Education method: Medical illustrator Education comprehension: verbalized understanding, returned demonstration, and needs further education  HOME EXERCISE PROGRAM: Next session  ASSESSMENT:  CLINICAL IMPRESSION: Patient is a 47 y.o. male who was seen today for physical therapy evaluation and treatment for s/p L acetabular fracture repair and LLE foot drop. Pt with deficits in expected L hip AROM within  posterior hip precautions with acute, also expected proximal LLE weakness. Pt with no active ankle DF or eversion since having footdrop since MOI/surgical repair with limitations in functional mobility due to LLE TDWB and posterior hip precautions thus relying on RW to perform household/short community distance ADL's to reduce falls risk. Pt with increased times in 5xSTS, TUG, and 10 meter gait indicative of higher falls risk. Pt with 30 visit PT limit and lengthy LLE WB precaution time frames limiting in near future of significant gait  improvements/ return to PLOF due to tissue healing guidelines  thus leading to pt with participation restrictions with work tasks, ability to safely asc/desc stairs, squatting, perform community walking tasks. Pt will benefit from skilled PT services to address these impairments to reduce falls risk and maximize return to PLOF.   OBJECTIVE IMPAIRMENTS: Abnormal gait, decreased activity tolerance, decreased balance, decreased mobility, difficulty walking, decreased ROM, decreased strength, impaired flexibility, impaired sensation, and pain.   ACTIVITY LIMITATIONS: carrying, lifting, bending, sitting, standing, squatting, sleeping, stairs, bathing, toileting, dressing, locomotion level, and caring for others  PARTICIPATION LIMITATIONS: cleaning, interpersonal relationship, driving, shopping, community activity, occupation, and yard work  PERSONAL FACTORS: Age, Fitness, Past/current experiences, Profession, Time since onset of injury/illness/exacerbation, and 3+ comorbidities: Alcohol use disorder (in early remission), CHF, cardiomyopathy, depression, anxiety  are also affecting patient's functional outcome.   REHAB POTENTIAL: Fair LLE foot drop, visit limit cap of 30 visits, multiple WB and hip precautions  CLINICAL DECISION MAKING: Evolving/moderate complexity  EVALUATION COMPLEXITY: Moderate   GOALS: Goals reviewed with patient? No  SHORT TERM GOALS: Target date: 08/25/22 Pt will be independent with HEP to improve L hip AROM and LLE strength to return to normalized gait mechanics with LRAD. Baseline: 07/14/22: HEP provided/discussed Goal status: INITIAL  2.  Pt will be able to ambulate > 500' with LRAD once WBAT, to complete household and short community distances.  Baseline: 07/14/22: TDWB on LLE, using RW only completing household distances. Goal status: INITIAL  LONG TERM GOALS: Target date: 10/06/22  Pt will improve FOTO to target score to demonstrate clinically significant improvement  in functional mobility. Baseline: 07/14/22: defer to next session Goal status: INITIAL  2.  Pt will improve TUG to < 12 sec with no AD to demo reduced falls risk and safe ability toc complete STS and stand to sit transfer without need for DME. Baseline: 07/14/22: LLE TDWB using RW at 13.95 seconds Goal status: INITIAL  3.  Pt will improve 5xSTS in < 12 seconds with LLE WBAT and no UE support to demo significant improvement in LE strength and reduced falls risk. Baseline: 07/14/22: 11.33 seconds maintaining LLE TDWB and BUE support on arm rests Goal status: INITIAL  4.  Pt will improve 10 meter gait speed to >/= 1.2 m/s with LRAD to demo ability to ambulate at safe velocity to return to mod-I completion of community walking tasks.  Baseline: 07/14/22: LLE TDWB using RW completing at .6 m/s Goal status: INITIAL  5.  Pt will be able to asc/desc full flight of stairs with reciprocal pattern without rails to return to complete independence with accessing second story of parent's home to access his bedroom. Baseline: 07/14/22: unable to complete due to WB status Goal status: INITIAL  PLAN:  PT FREQUENCY: 1-2x/week  PT DURATION: 12 weeks  PLANNED INTERVENTIONS: Therapeutic exercises, Therapeutic activity, Neuromuscular re-education, Balance training, Gait training, Patient/Family education, Self Care, Joint mobilization, Stair training, Vestibular training, Canalith repositioning, Orthotic/Fit training, DME instructions, Dry Needling, Electrical stimulation, Spinal mobilization, Cryotherapy, Moist heat, scar mobilization, Manual therapy, and Re-evaluation  PLAN FOR NEXT SESSION: FOTO, update/assess HEP, NMES for L foot drop, trial steps   Delphia Grates. Fairly IV, PT, DPT Physical Therapist-   [facility]  07/14/2022, 1:57 PM

## 2022-07-15 DIAGNOSIS — F331 Major depressive disorder, recurrent, moderate: Secondary | ICD-10-CM | POA: Diagnosis not present

## 2022-07-16 ENCOUNTER — Ambulatory Visit: Payer: BC Managed Care – PPO

## 2022-07-16 DIAGNOSIS — M6281 Muscle weakness (generalized): Secondary | ICD-10-CM | POA: Diagnosis not present

## 2022-07-16 DIAGNOSIS — M25552 Pain in left hip: Secondary | ICD-10-CM | POA: Diagnosis not present

## 2022-07-16 DIAGNOSIS — R262 Difficulty in walking, not elsewhere classified: Secondary | ICD-10-CM | POA: Diagnosis not present

## 2022-07-16 DIAGNOSIS — S72009A Fracture of unspecified part of neck of unspecified femur, initial encounter for closed fracture: Secondary | ICD-10-CM | POA: Diagnosis not present

## 2022-07-16 NOTE — Therapy (Signed)
OUTPATIENT PHYSICAL THERAPY LOWER EXTREMITY TREATMENT   Patient Name: Thomas Williamson MRN: [id number] DOB:05/30/1975, 47 y.o., male Today's Date: 07/16/2022  END OF SESSION:  PT End of Session - 07/16/22 1028     Visit Number 2    Number of Visits 25    Date for PT Re-Evaluation 10/06/22    PT Start Time 1030    PT Stop Time 1114    PT Time Calculation (min) 44 min    Equipment Utilized During Treatment Gait belt    Activity Tolerance Patient tolerated treatment well    Behavior During Therapy WFL for tasks assessed/performed             Past Medical History:  Diagnosis Date   Acetabulum fracture, left (HCC) 06/30/2022   Alcohol use disorder, severe, in early remission (HCC) 03/30/2018   Allergy    Anxiety    Bipolar disorder, in partial remission, most recent episode hypomanic (HCC) 03/30/2018   se[facility] Dr. [facility]a [facility]   Bleeding internal hemorrhoids 09/12/2013   Cannabis dependence (HCC) 07/13/2018   Daily use   Cervical disc disease 11/20/2014   Chronic anxiety 07/13/2018   Chronic systolic (congestive) heart failure (HCC)    Complication of anesthesia    Depression    Dyslipidemia    GERD (gastroesophageal reflux disease)    Hernia, inguinal, bilateral 02/24/2011   High ankle sprain of right lower extremity 01/16/2015   History of hiatal hernia    Hx of hepatitis C 10/05/2017   -treated in 2019 -hepatology recommended no further surveillance needed except for LFTs with labs and see hepatologist if elevated   Hypertension    Lipids abnormal 09/29/2013   Pneumonia    PONV (postoperative nausea and vomiting)    Vitamin D deficiency 07/01/2022   Past Surgical History:  Procedure Laterality Date   ANTERIOR CRUCIATE LIGAMENT REPAIR  2010   BIOPSY  03/13/2019   Procedure: BIOPSY;  Surgeon: Benancio Deeds, MD;  Location: [facility];  Service: Gastroenterology;;   COLONOSCOPY N/A 03/13/2019   per Dr. Adela Lank, adenomatous polyps,  repeat in 3 yrs   INGUINAL HERNIA REPAIR  02/23/2012   Procedure: LAPAROSCOPIC BILATERAL INGUINAL HERNIA REPAIR;  Surgeon: Kandis Cocking, MD;  Location: [facility];  Service: General;  Laterality: Bilateral;  Laparoscopic Bilateral Inguinal Hernia Repair with mesh   INSERTION OF MESH  02/23/2012   Procedure: INSERTION OF MESH;  Surgeon: Kandis Cocking, MD;  Location: [facility];  Service: General;  Laterality: N/A;   NOSE SURGERY  [id number]   ORIF ACETABULAR FRACTURE Left 06/30/2022   Procedure: OPEN REDUCTION INTERNAL FIXATION (ORIF) ACETABULAR FRACTURE;  Surgeon: Myrene Galas, MD;  Location: [facility];  Service: Orthopedics;  Laterality: Left;   POLYPECTOMY  03/13/2019   Procedure: POLYPECTOMY;  Surgeon: Benancio Deeds, MD;  Location: [facility];  Service: Gastroenterology;;   Patient Active Problem List   Diagnosis Date Noted   Abnormal LFTs 07/08/2022   Left foot drop 07/01/2022   Vitamin D deficiency 07/01/2022   Acetabulum fracture, left (HCC) 06/30/2022   Alcohol dependence (HCC) 01/05/2022   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   Alcohol-induced acute pancreatitis without infection or necrosis 12/05/2021   Genetic testing 04/21/2019   Benign neoplasm of colon    Loose stools    History of colonic polyps 03/07/2019   Cardiac left ventricular ejection fraction 30-35 percent 03/07/2019   Alcoholic ketoacidosis 11/18/2018   Dyslipidemia 09/16/2018   Fever blister 09/16/2018   Cardiomyopathy,  alcoholic (HCC) 09/16/2018   Left bundle branch block (LBBB) 09/16/2018   Cannabis dependence (HCC) 07/13/2018   Chronic anxiety 07/13/2018   Bipolar 1 disorder, depressed, full remission (HCC) 03/30/2018   Cervical disc disease 11/20/2014   Hypertension 02/24/2011    PCP: Nelwyn Salisbury, MD  REFERRING PROVIDER: Myrene Galas, MD  REFERRING DIAG: S72.009A (ICD-10-CM) - Hip fracture [facility])  THERAPY DIAG:  Muscle weakness (generalized)  Difficulty in walking, not  elsewhere classified  Pain in left hip  Rationale for Evaluation and Treatment: Rehabilitation  ONSET DATE: 06/30/22  SUBJECTIVE:   SUBJECTIVE STATEMENT: Pt reports soreness today at L hip. Not much pain today. No falls. Reports getting tennis balls for back of RW.   PERTINENT HISTORY: Pt is a pleasant 47 y.o. male s/p L acetabular fracture repair and hip dislocation after fall walking dog down stairs. Pt with subsequent LLE foot drop still since operation on 06/30/22. Reports managing well at home, just wants to be able to learn how to asc/desc steps and return to independence. Currently unemployed but has a job lined up for when he recovers that will require regular standing/walking, helping people perform ADL's. Denies falls since being home. Worst pain 8/10 NPS in L hip at surgical site. Reports burning pain along deep peroneal nerve distribution starting at fibular head traveling along lateral aspect of lower leg to dorsal part of foot. Pt with hypersensitivity to light touch and tenderness at fibular head.  PAIN:  Are you having pain? Yes: NPRS scale: 8/10 Pain location: L hip surgical site Pain description: burning in foot,  Aggravating factors: some LLE mobility but tolerable, touching L foot.  Relieving factors: Medication,    PRECAUTIONS: Other: TDWB with LLE, ( posterior hip precautions  )  WEIGHT BEARING RESTRICTIONS: Yes TDWB 8 weeks from 06/30/22, post hip precautions 12 weeks from 06/30/22  FALLS:  Has patient fallen in last 6 months? No  LIVING ENVIRONMENT: Lives with: lives with their family Lives in: House/apartment Stairs: Yes: Internal: flight steps; on right going up and External: 1 steps; none Has following equipment at home: Walker - 2 wheeled and bed side commode  OCCUPATION: unemployed before injury. 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 Reports needing to be able to be able to walk, help with group home ADL's.   PLOF:  Independent  PATIENT GOALS: Return to independent  NEXT MD VISIT: PCP, 07/20/22 and Dr. Carola Frost on 07/27/22  OBJECTIVE:   DIAGNOSTIC FINDINGS:  CLINICAL DATA:  Postoperative status.   EXAM: DG HIP (WITH OR WITHOUT PELVIS) 1V*L*   COMPARISON:  CT scan of same day.   FINDINGS: No dislocation is noted. There appears to be improved alignment involving the posterior acetabular fracture noted on prior CT scan. No other bony abnormality is noted.   IMPRESSION: No dislocation is noted. Probable improved alignment involving posterior acetabular fracture on the left.     Electronically Signed   By: Lupita Raider M.D.  PATIENT SURVEYS:  FOTO Deferred to next session  COGNITION: Overall cognitive status: Within functional limits for tasks assessed     SENSATION: Light touch: Impaired  L4-L5 on L foot (great toe, medial malleolus)    POSTURE: No Significant postural limitations  PALPATION: TTP along LLE fibular head along deep peroneal nerve to dorsum of foot  LOWER EXTREMITY ROM:  Active ROM Right eval Left eval  Hip flexion  90  Hip extension    Hip abduction  Hip adduction    Hip internal rotation    Hip external rotation 40 28  Knee flexion    Knee extension 0 0  Ankle dorsiflexion 10 Unable due to L foot drop  Ankle plantarflexion 55 42  Ankle inversion 35 20  Ankle eversion 20 unable   (Blank rows = not tested)   LLE PROM ankle DF/gastroc muscle length: 82 degrees  LOWER EXTREMITY MMT:  MMT Right eval Left eval  Hip flexion 5 5  Hip extension    Hip abduction 5 4  Hip adduction 5 5  Hip internal rotation    Hip external rotation    Knee flexion 5 5  Knee extension 5 5  Ankle dorsiflexion 5 0  Ankle plantarflexion 5 5  Ankle inversion 5 4  Ankle eversion 5 0   (Blank rows = not tested)  LOWER EXTREMITY SPECIAL TESTS:  N/A  FUNCTIONAL TESTS:  5 times sit to stand: 11.33 sec, needs BUE support, TDWB on LLE Timed up and go (TUG): 13.95  seconds with RW 10 meter walk test: .6 m/s using RW and TDWB on LLE   GAIT: Distance walked: 10 meters Assistive device utilized: Walker - 2 wheeled Level of assistance: Modified independence Comments: LLE TTWB using step to pattern with RW.   TODAY'S TREATMENT:                                                                                                                              DATE: 07/16/22   There.ex:   FOTO: 50 with target of 67  Reviewed acute care PT HEP: educated to perform 2-3x/day, 15-20 reps    L gastroc length/PROM ankle DF: 82 degrees    Ankle pumps seated, LLE heel raise due to L foot drop: x20/LE    Supine gastroc stretch LLE with belt: 3x30 sec    Quad set LLE: x15   Heel slides LLE: x15 maintaining post hip precautions    SAQ's LLE: x15   Hip abduction supine: x15   Seated    LAQ's: x15/LLE   Standing:    LLE hip flexion maintaining post hip precautions: x15   LLE hip abduction: x15   LLE hip extension: x15   Adjusted RW to appropriate height to reduce falls risk and improve upright posture  PATIENT EDUCATION:  Education details: Education on importance of L ankle mobility in case pt will need AFO in the future for L foot drop to prevent gastrocnemius contracture. Reviewed post hip precautions and TDWB precautions and importance to comply for tissue healing.  Person educated: Patient Education method: Medical illustrator Education comprehension: verbalized understanding, returned demonstration, and needs further education  HOME EXERCISE PROGRAM: Ankle pumps seated, LLE heel raise due to L foot drop: x20/LE    Supine gastroc stretch LLE with belt: 3x30 sec    Quad set LLE: x15   Heel slides LLE: x15 maintaining post hip precautions    SAQ's  LLE: x15   Hip abduction supine: x15   Seated    LAQ's: x15/LLE   Standing:    LLE hip flexion maintaining post hip precautions: x15   LLE hip abduction: x15   LLE hip extension:  x15  ASSESSMENT:  CLINICAL IMPRESSION: Pt arriving to clinic highly motivated. Reviewed importance of HEP compliance to maintain LLE joint integrity, AROM, and muscle strength in prep for when he can be WBAT in the future. Intermittent need for VC's for standing and ensure LLE TDWB using RW with good carryover. Pt with good understanding of robust HEP today with very little meed for multimodal cuing or demonstration. For now will plan for 1x/week but due to 30 visit limit if pt has excellent HEP understanding, may reduce POC to 1x every other week to preserve visits for future mobility when WB precautions are lifted to maximize functional capacity to PLOF. Pt understanding and in agreement to POC. Pt will benefit from skilled PT services to address these impairments to reduce falls risk and maximize return to PLOF.   OBJECTIVE IMPAIRMENTS: Abnormal gait, decreased activity tolerance, decreased balance, decreased mobility, difficulty walking, decreased ROM, decreased strength, impaired flexibility, impaired sensation, and pain.   ACTIVITY LIMITATIONS: carrying, lifting, bending, sitting, standing, squatting, sleeping, stairs, bathing, toileting, dressing, locomotion level, and caring for others  PARTICIPATION LIMITATIONS: cleaning, interpersonal relationship, driving, shopping, community activity, occupation, and yard work  PERSONAL FACTORS: Age, Fitness, Past/current experiences, Profession, Time since onset of injury/illness/exacerbation, and 3+ comorbidities: Alcohol use disorder (in early remission), CHF, cardiomyopathy, depression, anxiety  are also affecting patient's functional outcome.   REHAB POTENTIAL: Fair LLE foot drop, visit limit cap of 30 visits, multiple WB and hip precautions  CLINICAL DECISION MAKING: Evolving/moderate complexity  EVALUATION COMPLEXITY: Moderate   GOALS: Goals reviewed with patient? No  SHORT TERM GOALS: Target date: 08/25/22 Pt will be independent with HEP  to improve L hip AROM and LLE strength to return to normalized gait mechanics with LRAD. Baseline: 07/14/22: HEP provided/discussed Goal status: INITIAL  2.  Pt will be able to ambulate > 500' with LRAD once WBAT, to complete household and short community distances.  Baseline: 07/14/22: TDWB on LLE, using RW only completing household distances. Goal status: INITIAL  LONG TERM GOALS: Target date: 10/06/22  Pt will improve FOTO to target score to demonstrate clinically significant improvement in functional mobility. Baseline: 07/14/22: defer to next session Goal status: INITIAL  2.  Pt will improve TUG to < 12 sec with no AD to demo reduced falls risk and safe ability toc complete STS and stand to sit transfer without need for DME. Baseline: 07/14/22: LLE TDWB using RW at 13.95 seconds Goal status: INITIAL  3.  Pt will improve 5xSTS in < 12 seconds with LLE WBAT and no UE support to demo significant improvement in LE strength and reduced falls risk. Baseline: 07/14/22: 11.33 seconds maintaining LLE TDWB and BUE support on arm rests Goal status: INITIAL  4.  Pt will improve 10 meter gait speed to >/= 1.2 m/s with LRAD to demo ability to ambulate at safe velocity to return to mod-I completion of community walking tasks.  Baseline: 07/14/22: LLE TDWB using RW completing at .6 m/s Goal status: INITIAL  5.  Pt will be able to asc/desc full flight of stairs with reciprocal pattern without rails to return to complete independence with accessing second story of parent's home to access his bedroom. Baseline: 07/14/22: unable to complete due to WB status Goal status:  INITIAL  PLAN:  PT FREQUENCY: 1-2x/week  PT DURATION: 12 weeks  PLANNED INTERVENTIONS: Therapeutic exercises, Therapeutic activity, Neuromuscular re-education, Balance training, Gait training, Patient/Family education, Self Care, Joint mobilization, Stair training, Vestibular training, Canalith repositioning, Orthotic/Fit training, DME  instructions, Dry Needling, Electrical stimulation, Spinal mobilization, Cryotherapy, Moist heat, scar mobilization, Manual therapy, and Re-evaluation  PLAN FOR NEXT SESSION: NMES for L foot drop, trial steps, LLE strength/mobility with AW's   Delphia Grates. Fairly IV, PT, DPT Physical Therapist- [place]  [facility]  07/16/2022, 12:51 PM

## 2022-07-20 ENCOUNTER — Encounter: Payer: Self-pay | Admitting: Family Medicine

## 2022-07-20 ENCOUNTER — Ambulatory Visit: Payer: BC Managed Care – PPO

## 2022-07-20 ENCOUNTER — Ambulatory Visit (INDEPENDENT_AMBULATORY_CARE_PROVIDER_SITE_OTHER): Payer: BC Managed Care – PPO | Admitting: Family Medicine

## 2022-07-20 VITALS — BP 110/80 | HR 101 | Temp 98.8°F | Wt 175.0 lb

## 2022-07-20 DIAGNOSIS — F3176 Bipolar disorder, in full remission, most recent episode depressed: Secondary | ICD-10-CM | POA: Diagnosis not present

## 2022-07-20 DIAGNOSIS — S32402A Unspecified fracture of left acetabulum, initial encounter for closed fracture: Secondary | ICD-10-CM | POA: Diagnosis not present

## 2022-07-20 DIAGNOSIS — M21372 Foot drop, left foot: Secondary | ICD-10-CM | POA: Diagnosis not present

## 2022-07-20 DIAGNOSIS — F1029 Alcohol dependence with unspecified alcohol-induced disorder: Secondary | ICD-10-CM | POA: Diagnosis not present

## 2022-07-20 DIAGNOSIS — R002 Palpitations: Secondary | ICD-10-CM

## 2022-07-20 NOTE — Progress Notes (Signed)
   Subjective:    Patient ID: Thomas Williamson, male    DOB: August 25, 1975, 47 y.o.   MRN: [id number]  HPI Here to follow up on a hospital stay from 06-30-22 to 07-10-22 for an injury sustained by tripping over his dog while going down some steps. This caused a left hip dislocation and an acetabular fracture. This was repaired by an ORIF surgery per Dr. Myrene Galas. There was no head trauma. He had been drinking 1 or 2 drinks a day prior to this fall, but he says he has not touched alcohol since then. He has not started PT yet, because they want his hip to heal a little further so he can bear weight on it. So far he is walking with crutches and only toe touching with the left foot. He also cannot lift up his left foot, though he can move his leg without difficulty. He feels numbness in the anterior and lateral left leg and across the top of his foot. He was found to have hepatitis C, which is not currently active. Lastly he was found to have some palpitations, so he was given a 14 day monitor to wear. This was normal except for one 11.5 second run of SVT.    Review of Systems  Constitutional: Negative.   Respiratory: Negative.    Cardiovascular: Negative.   Musculoskeletal:  Positive for arthralgias.  Neurological:  Positive for weakness and numbness.  Psychiatric/Behavioral: Negative.         Objective:   Physical Exam Constitutional:      Comments: Walking with crutches   Cardiovascular:     Rate and Rhythm: Normal rate and regular rhythm.     Pulses: Normal pulses.  Pulmonary:     Effort: Pulmonary effort is normal.     Breath sounds: Normal breath sounds.  Musculoskeletal:     Comments: He holds the left leg straight. He also has a left foot drop. He cannot dorsiflex the left foot   Neurological:     General: No focal deficit present.     Mental Status: He is alert and oriented to person, place, and time.  Psychiatric:        Mood and Affect: Mood normal.        Behavior:  Behavior normal.        Thought Content: Thought content normal.           Assessment & Plan:  He is recovering from  left hip fracture and dislocation. He will follow up with Dr. Carola Frost, snd hopefully he can begin PT soon. He has a left foot drop which suggests a peroneal nerve injury, and I urged him to discuss this with Dr. Carola Frost and with PT as well. He will follow up with Dr. Adela Lank on 10-21-22 about the hepatitis C. He wil follow up with Dr. Thomasene Ripple of Cardiology on 09-22-22. He has a hx of alcohol abuse, and he is seemingly trying to quit. We spent a total of (  35 ) minutes reviewing records and discussing these issues.  Gershon Crane, MD

## 2022-07-22 ENCOUNTER — Ambulatory Visit: Payer: BC Managed Care – PPO

## 2022-07-22 DIAGNOSIS — R262 Difficulty in walking, not elsewhere classified: Secondary | ICD-10-CM

## 2022-07-22 DIAGNOSIS — M25552 Pain in left hip: Secondary | ICD-10-CM

## 2022-07-22 DIAGNOSIS — F331 Major depressive disorder, recurrent, moderate: Secondary | ICD-10-CM | POA: Diagnosis not present

## 2022-07-22 DIAGNOSIS — M6281 Muscle weakness (generalized): Secondary | ICD-10-CM

## 2022-07-22 DIAGNOSIS — S72009A Fracture of unspecified part of neck of unspecified femur, initial encounter for closed fracture: Secondary | ICD-10-CM | POA: Diagnosis not present

## 2022-07-22 NOTE — Therapy (Signed)
OUTPATIENT PHYSICAL THERAPY LOWER EXTREMITY TREATMENT   Patient Name: Thomas Williamson MRN: [id number] DOB:05-19-1975, 47 y.o., male Today's Date: 07/22/2022  END OF SESSION:  PT End of Session - 07/22/22 1643     Visit Number 3    Number of Visits 25    Date for PT Re-Evaluation 10/06/22    PT Start Time 1515    PT Stop Time 1602    PT Time Calculation (min) 47 min    Equipment Utilized During Treatment Gait belt    Activity Tolerance Patient tolerated treatment well    Behavior During Therapy WFL for tasks assessed/performed             Past Medical History:  Diagnosis Date   Acetabulum fracture, left (HCC) 06/30/2022   Alcohol use disorder, severe, in early remission (HCC) 03/30/2018   Allergy    Anxiety    Bipolar disorder, in partial remission, most recent episode hypomanic (HCC) 03/30/2018   [facility] Dr[facility]a [facility]   Bleeding internal hemorrhoids 09/12/2013   Cannabis dependence (HCC) 07/13/2018   Daily use   Cervical disc disease 11/20/2014   Chronic anxiety 07/13/2018   Chronic systolic (congestive) heart failure (HCC)    Complication of anesthesia    Depression    Dyslipidemia    GERD (gastroesophageal reflux disease)    Hernia, inguinal, bilateral 02/24/2011   High ankle sprain of right lower extremity 01/16/2015   History of hiatal hernia    Hx of hepatitis C 10/05/2017   -treated in 2019 -hepatology recommended no further surveillance needed except for LFTs with labs and see hepatologist if elevated   Hypertension    Lipids abnormal 09/29/2013   Pneumonia    PONV (postoperative nausea and vomiting)    Vitamin D deficiency 07/01/2022   Past Surgical History:  Procedure Laterality Date   ANTERIOR CRUCIATE LIGAMENT REPAIR  2010   BIOPSY  03/13/2019   Procedure: BIOPSY;  Surgeon: Benancio Deeds, MD;  Location: [facility];  Service: Gastroenterology;;   COLONOSCOPY N/A 03/13/2019   per Dr. Adela Lank, adenomatous polyps,  repeat in 3 yrs   INGUINAL HERNIA REPAIR  02/23/2012   Procedure: LAPAROSCOPIC BILATERAL INGUINAL HERNIA REPAIR;  Surgeon: Kandis Cocking, MD;  Location: [facility];  Service: General;  Laterality: Bilateral;  Laparoscopic Bilateral Inguinal Hernia Repair with mesh   INSERTION OF MESH  02/23/2012   Procedure: INSERTION OF MESH;  Surgeon: Kandis Cocking, MD;  Location: [facility];  Service: General;  Laterality: N/A;   NOSE SURGERY  [id number]   ORIF ACETABULAR FRACTURE Left 06/30/2022   Procedure: OPEN REDUCTION INTERNAL FIXATION (ORIF) ACETABULAR FRACTURE;  Surgeon: Myrene Galas, MD;  Location: [facility];  Service: Orthopedics;  Laterality: Left;   POLYPECTOMY  03/13/2019   Procedure: POLYPECTOMY;  Surgeon: Benancio Deeds, MD;  Location: [facility];  Service: Gastroenterology;;   Patient Active Problem List   Diagnosis Date Noted   Abnormal LFTs 07/08/2022   Left foot drop 07/01/2022   Vitamin D deficiency 07/01/2022   Acetabulum fracture, left (HCC) 06/30/2022   Alcohol dependence (HCC) 01/05/2022   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   Alcohol-induced acute pancreatitis without infection or necrosis 12/05/2021   Genetic testing 04/21/2019   Benign neoplasm of colon    Loose stools    History of colonic polyps 03/07/2019   Cardiac left ventricular ejection fraction 30-35 percent 03/07/2019   Alcoholic ketoacidosis 11/18/2018   Dyslipidemia 09/16/2018   Fever blister 09/16/2018   Cardiomyopathy,  alcoholic (HCC) 09/16/2018   Left bundle branch block (LBBB) 09/16/2018   Cannabis dependence (HCC) 07/13/2018   Chronic anxiety 07/13/2018   Bipolar 1 disorder, depressed, full remission (HCC) 03/30/2018   Cervical disc disease 11/20/2014   Hypertension 02/24/2011    PCP: Nelwyn Salisbury, MD  REFERRING PROVIDER: Myrene Galas, MD  REFERRING DIAG: S72.009A (ICD-10-CM) - Hip fracture [facility])  THERAPY DIAG:  Muscle weakness (generalized)  Pain in left hip  Difficulty  in walking, not elsewhere classified  Rationale for Evaluation and Treatment: Rehabilitation  ONSET DATE: 06/30/22  SUBJECTIVE:   SUBJECTIVE STATEMENT: Pt reports HEP compliance and no falls. Reports f/u with PCP went well. Beginning to have more regular pins/needles and electric type shocks along peroneal nerve distribution. Pt curious if this could mean nerve function returning.  PERTINENT HISTORY: Pt is a pleasant 47 y.o. male s/p L acetabular fracture repair and hip dislocation after fall walking dog down stairs. Pt with subsequent LLE foot drop still since operation on 06/30/22. Reports managing well at home, just wants to be able to learn how to asc/desc steps and return to independence. Currently unemployed but has a job lined up for when he recovers that will require regular standing/walking, helping people perform ADL's. Denies falls since being home. Worst pain 8/10 NPS in L hip at surgical site. Reports burning pain along deep peroneal nerve distribution starting at fibular head traveling along lateral aspect of lower leg to dorsal part of foot. Pt with hypersensitivity to light touch and tenderness at fibular head.  PAIN:  Are you having pain? Yes: NPRS scale: 8/10 Pain location: L hip surgical site Pain description: burning in foot,  Aggravating factors: some LLE mobility but tolerable, touching L foot.  Relieving factors: Medication,    PRECAUTIONS: Other: TDWB with LLE, ( posterior hip precautions  )  WEIGHT BEARING RESTRICTIONS: Yes TDWB 8 weeks from 06/30/22, post hip precautions 12 weeks from 06/30/22  FALLS:  Has patient fallen in last 6 months? No  LIVING ENVIRONMENT: Lives with: lives with their family Lives in: House/apartment Stairs: Yes: Internal: flight steps; on right going up and External: 1 steps; none Has following equipment at home: Walker - 2 wheeled and bed side commode  OCCUPATION: unemployed before injury. 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 Reports needing to be able to be able to walk, help with group home ADL's.   PLOF: Independent  PATIENT GOALS: Return to independent  NEXT MD VISIT: PCP, 07/20/22 and Dr. Carola Frost on 07/27/22  OBJECTIVE:   DIAGNOSTIC FINDINGS:  CLINICAL DATA:  Postoperative status.   EXAM: DG HIP (WITH OR WITHOUT PELVIS) 1V*L*   COMPARISON:  CT scan of same day.   FINDINGS: No dislocation is noted. There appears to be improved alignment involving the posterior acetabular fracture noted on prior CT scan. No other bony abnormality is noted.   IMPRESSION: No dislocation is noted. Probable improved alignment involving posterior acetabular fracture on the left.     Electronically Signed   By: Lupita Raider M.D.  PATIENT SURVEYS:  FOTO Deferred to next session  COGNITION: Overall cognitive status: Within functional limits for tasks assessed     SENSATION: Light touch: Impaired  L4-L5 on L foot (great toe, medial malleolus)    POSTURE: No Significant postural limitations  PALPATION: TTP along LLE fibular head along deep peroneal nerve to dorsum of foot  LOWER EXTREMITY ROM:  Active ROM Right eval Left eval  Hip flexion  90  Hip extension    Hip abduction    Hip adduction    Hip internal rotation    Hip external rotation 40 28  Knee flexion    Knee extension 0 0  Ankle dorsiflexion 10 Unable due to L foot drop  Ankle plantarflexion 55 42  Ankle inversion 35 20  Ankle eversion 20 unable   (Blank rows = not tested)   LLE PROM ankle DF/gastroc muscle length: 82 degrees  LOWER EXTREMITY MMT:  MMT Right eval Left eval  Hip flexion 5 5  Hip extension    Hip abduction 5 4  Hip adduction 5 5  Hip internal rotation    Hip external rotation    Knee flexion 5 5  Knee extension 5 5  Ankle dorsiflexion 5 0  Ankle plantarflexion 5 5  Ankle inversion 5 4  Ankle eversion 5 0   (Blank rows = not tested)  LOWER EXTREMITY SPECIAL TESTS:  N/A  FUNCTIONAL TESTS:   5 times sit to stand: 11.33 sec, needs BUE support, TDWB on LLE Timed up and go (TUG): 13.95 seconds with RW 10 meter walk test: .6 m/s using RW and TDWB on LLE   GAIT: Distance walked: 10 meters Assistive device utilized: Walker - 2 wheeled Level of assistance: Modified independence Comments: LLE TTWB using step to pattern with RW.   TODAY'S TREATMENT:                                                                                                                              DATE: 07/22/22   There.ex:   Reviewed stair navigation maintaining TDWB on LLE with butt scoot method    Quad set LLE: x15 with L foot on 1/2 bolster    SLR on LLE: 3x8. Mod VC's for quad set prior to lift off. Good carryover.     Prone knee flexion, LLE with GTB: 3x8. VC's for maintaining neutral hip alignment    R side lying clamshell for L hip ER: 3x8. Good form/technique.    SAQ's LLE: 3x8 with 4# AW's. VC's for eccentric control.    Hip abduction standing at RW: 3x8. VC's for upright posture.     Reviewed updated HEP with pt. Educated on importance of hip abduction/extension strength for gait, standing, and transfers when pt can transition to WBAT. Planning for POC update to 1x/week every other week to save visits due to 30 limit cap.  PATIENT EDUCATION:  Education details: Education on importance of L ankle mobility in case pt will need AFO in the future for L foot drop to prevent gastrocnemius contracture. Reviewed post hip precautions and TDWB precautions and importance to comply for tissue healing.  Person educated: Patient Education method: Medical illustrator Education comprehension: verbalized understanding, returned demonstration, and needs further education  HOME EXERCISE PROGRAM: Access Code: [email or web address] URL: [email or web address] Date: 07/22/2022 Prepared by: Ronnie Derby  Exercises - Prone Hip Extension  -  1 x daily - 3-4 x weekly - 3 sets - 8 reps - Clamshell   - 1 x daily - 3-4 x weekly - 3 sets - 8 reps - Prone Hamstring Curl with Anchored Resistance  - 1 x daily - 3-4 x weekly - 3 sets - 8 reps - Supine Active Straight Leg Raise  - 1 x daily - 3-4 x weekly - 3 sets - 8 reps    Ankle pumps seated, LLE heel raise due to L foot drop: x20/LE    Supine gastroc stretch LLE with belt: 3x30 sec    Quad set LLE: x15   Heel slides LLE: x15 maintaining post hip precautions    SAQ's LLE: x15   Hip abduction supine: x15   Seated    LAQ's: x15/LLE   Standing:    LLE hip flexion maintaining post hip precautions: x15   LLE hip abduction: x15   LLE hip extension: x15  ASSESSMENT:  CLINICAL IMPRESSION: Pt progressing in resisted exercises for LLE in prep for future WBAT. Pt remains very weak in hip abduction unable to perform against gravity. Provided updated HEP to progress proximal hip strength with use of gravity as resistance along with AW's and gravity. PT reports understanding. Agreeable t adjust POC to 1x/week every other week to save visits due to 30 limit cap. Continuing to review hip precautions and TDWB on LLE with gait to ensure safe and appropriate healing to prevent complications. Pt understanding. Pt will benefit from skilled PT services to address these impairments to reduce falls risk and maximize return to PLOF.   OBJECTIVE IMPAIRMENTS: Abnormal gait, decreased activity tolerance, decreased balance, decreased mobility, difficulty walking, decreased ROM, decreased strength, impaired flexibility, impaired sensation, and pain.   ACTIVITY LIMITATIONS: carrying, lifting, bending, sitting, standing, squatting, sleeping, stairs, bathing, toileting, dressing, locomotion level, and caring for others  PARTICIPATION LIMITATIONS: cleaning, interpersonal relationship, driving, shopping, community activity, occupation, and yard work  PERSONAL FACTORS: Age, Fitness, Past/current experiences, Profession, Time since onset of injury/illness/exacerbation,  and 3+ comorbidities: Alcohol use disorder (in early remission), CHF, cardiomyopathy, depression, anxiety  are also affecting patient's functional outcome.   REHAB POTENTIAL: Fair LLE foot drop, visit limit cap of 30 visits, multiple WB and hip precautions  CLINICAL DECISION MAKING: Evolving/moderate complexity  EVALUATION COMPLEXITY: Moderate   GOALS: Goals reviewed with patient? No  SHORT TERM GOALS: Target date: 08/25/22 Pt will be independent with HEP to improve L hip AROM and LLE strength to return to normalized gait mechanics with LRAD. Baseline: 07/14/22: HEP provided/discussed Goal status: INITIAL  2.  Pt will be able to ambulate > 500' with LRAD once WBAT, to complete household and short community distances.  Baseline: 07/14/22: TDWB on LLE, using RW only completing household distances. Goal status: INITIAL  LONG TERM GOALS: Target date: 10/06/22  Pt will improve FOTO to target score to demonstrate clinically significant improvement in functional mobility. Baseline: 07/14/22: defer to next session Goal status: INITIAL  2.  Pt will improve TUG to < 12 sec with no AD to demo reduced falls risk and safe ability toc complete STS and stand to sit transfer without need for DME. Baseline: 07/14/22: LLE TDWB using RW at 13.95 seconds Goal status: INITIAL  3.  Pt will improve 5xSTS in < 12 seconds with LLE WBAT and no UE support to demo significant improvement in LE strength and reduced falls risk. Baseline: 07/14/22: 11.33 seconds maintaining LLE TDWB and BUE support on arm rests Goal status: INITIAL  4.  Pt will improve 10 meter gait speed to >/= 1.2 m/s with LRAD to demo ability to ambulate at safe velocity to return to mod-I completion of community walking tasks.  Baseline: 07/14/22: LLE TDWB using RW completing at .6 m/s Goal status: INITIAL  5.  Pt will be able to asc/desc full flight of stairs with reciprocal pattern without rails to return to complete independence with accessing  second story of parent's home to access his bedroom. Baseline: 07/14/22: unable to complete due to WB status Goal status: INITIAL  PLAN:  PT FREQUENCY: 1-2x/week  PT DURATION: 12 weeks  PLANNED INTERVENTIONS: Therapeutic exercises, Therapeutic activity, Neuromuscular re-education, Balance training, Gait training, Patient/Family education, Self Care, Joint mobilization, Stair training, Vestibular training, Canalith repositioning, Orthotic/Fit training, DME instructions, Dry Needling, Electrical stimulation, Spinal mobilization, Cryotherapy, Moist heat, scar mobilization, Manual therapy, and Re-evaluation  PLAN FOR NEXT SESSION: NMES for L foot drop, LLE strength/mobility with AW's   Delphia Grates. Fairly IV, PT, DPT Physical Therapist- Deputy  [facility]  07/22/2022, 4:51 PM

## 2022-07-27 DIAGNOSIS — S32462D Displaced associated transverse-posterior fracture of left acetabulum, subsequent encounter for fracture with routine healing: Secondary | ICD-10-CM | POA: Diagnosis not present

## 2022-07-27 DIAGNOSIS — S73015D Posterior dislocation of left hip, subsequent encounter: Secondary | ICD-10-CM | POA: Diagnosis not present

## 2022-07-29 DIAGNOSIS — F331 Major depressive disorder, recurrent, moderate: Secondary | ICD-10-CM | POA: Diagnosis not present

## 2022-08-05 ENCOUNTER — Ambulatory Visit: Payer: BC Managed Care – PPO | Attending: Orthopedic Surgery

## 2022-08-05 DIAGNOSIS — M25552 Pain in left hip: Secondary | ICD-10-CM | POA: Insufficient documentation

## 2022-08-05 DIAGNOSIS — M6281 Muscle weakness (generalized): Secondary | ICD-10-CM | POA: Insufficient documentation

## 2022-08-05 DIAGNOSIS — F331 Major depressive disorder, recurrent, moderate: Secondary | ICD-10-CM | POA: Diagnosis not present

## 2022-08-05 DIAGNOSIS — R262 Difficulty in walking, not elsewhere classified: Secondary | ICD-10-CM | POA: Diagnosis not present

## 2022-08-05 NOTE — Therapy (Signed)
OUTPATIENT PHYSICAL THERAPY LOWER EXTREMITY TREATMENT   Patient Name: Thomas Williamson MRN: [id number] DOB:1975-10-14, 46 y.o., male Today's Date: 08/05/2022  END OF SESSION:  PT End of Session - 08/05/22 0817     Visit Number 4    Number of Visits 25    Date for PT Re-Evaluation 10/06/22    PT Start Time 0815    PT Stop Time 0901    PT Time Calculation (min) 46 min    Equipment Utilized During Treatment Gait belt    Activity Tolerance Patient tolerated treatment well    Behavior During Therapy WFL for tasks assessed/performed             Past Medical History:  Diagnosis Date   Acetabulum fracture, left (HCC) 06/30/2022   Alcohol use disorder, severe, in early remission (HCC) 03/30/2018   Allergy    Anxiety    Bipolar disorder, in partial remission, most recent episode hypomanic (HCC) 03/30/2018   [facility] Dr[facility]a [facility]   Bleeding internal hemorrhoids 09/12/2013   Cannabis dependence (HCC) 07/13/2018   Daily use   Cervical disc disease 11/20/2014   Chronic anxiety 07/13/2018   Chronic systolic (congestive) heart failure (HCC)    Complication of anesthesia    Depression    Dyslipidemia    GERD (gastroesophageal reflux disease)    Hernia, inguinal, bilateral 02/24/2011   High ankle sprain of right lower extremity 01/16/2015   History of hiatal hernia    Hx of hepatitis C 10/05/2017   -treated in 2019 -hepatology recommended no further surveillance needed except for LFTs with labs and see hepatologist if elevated   Hypertension    Lipids abnormal 09/29/2013   Pneumonia    PONV (postoperative nausea and vomiting)    Vitamin D deficiency 07/01/2022   Past Surgical History:  Procedure Laterality Date   ANTERIOR CRUCIATE LIGAMENT REPAIR  2010   BIOPSY  03/13/2019   Procedure: BIOPSY;  Surgeon: Benancio Deeds, MD;  Location: [facility];  Service: Gastroenterology;;   COLONOSCOPY N/A 03/13/2019   per Dr. Adela Lank, adenomatous polyps,  repeat in 3 yrs   INGUINAL HERNIA REPAIR  02/23/2012   Procedure: LAPAROSCOPIC BILATERAL INGUINAL HERNIA REPAIR;  Surgeon: Kandis Cocking, MD;  Location: [facility];  Service: General;  Laterality: Bilateral;  Laparoscopic Bilateral Inguinal Hernia Repair with mesh   INSERTION OF MESH  02/23/2012   Procedure: INSERTION OF MESH;  Surgeon: Kandis Cocking, MD;  Location: [facility];  Service: General;  Laterality: N/A;   NOSE SURGERY  [id number]   ORIF ACETABULAR FRACTURE Left 06/30/2022   Procedure: OPEN REDUCTION INTERNAL FIXATION (ORIF) ACETABULAR FRACTURE;  Surgeon: Myrene Galas, MD;  Location: [facility];  Service: Orthopedics;  Laterality: Left;   POLYPECTOMY  03/13/2019   Procedure: POLYPECTOMY;  Surgeon: Benancio Deeds, MD;  Location: [facility];  Service: Gastroenterology;;   Patient Active Problem List   Diagnosis Date Noted   Abnormal LFTs 07/08/2022   Left foot drop 07/01/2022   Vitamin D deficiency 07/01/2022   Acetabulum fracture, left (HCC) 06/30/2022   Alcohol dependence (HCC) 01/05/2022   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   Alcohol-induced acute pancreatitis without infection or necrosis 12/05/2021   Genetic testing 04/21/2019   Benign neoplasm of colon    Loose stools    History of colonic polyps 03/07/2019   Cardiac left ventricular ejection fraction 30-35 percent 03/07/2019   Alcoholic ketoacidosis 11/18/2018   Dyslipidemia 09/16/2018   Fever blister 09/16/2018   Cardiomyopathy,  alcoholic (HCC) 09/16/2018   Left bundle branch block (LBBB) 09/16/2018   Cannabis dependence (HCC) 07/13/2018   Chronic anxiety 07/13/2018   Bipolar 1 disorder, depressed, full remission (HCC) 03/30/2018   Cervical disc disease 11/20/2014   Hypertension 02/24/2011    PCP: Nelwyn Salisbury, MD  REFERRING PROVIDER: Myrene Galas, MD  REFERRING DIAG: S72.009A (ICD-10-CM) - Hip fracture [facility])  THERAPY DIAG:  Muscle weakness (generalized)  Pain in left hip  Difficulty  in walking, not elsewhere classified  Rationale for Evaluation and Treatment: Rehabilitation  ONSET DATE: 06/30/22  SUBJECTIVE:   SUBJECTIVE STATEMENT: Pt reports HEP compliance and no falls. Reports ambulating on crutches now. Had a good f/u appt with Dr. Carola Frost. Still having mild hip pain. Worst pain 4/10 NPS. Still experiencing peroneal nerve pains along fibula and dorsal aspect of foot.   PERTINENT HISTORY: Pt is a pleasant 47 y.o. male s/p L acetabular fracture repair and hip dislocation after fall walking dog down stairs. Pt with subsequent LLE foot drop still since operation on 06/30/22. Reports managing well at home, just wants to be able to learn how to asc/desc steps and return to independence. Currently unemployed but has a job lined up for when he recovers that will require regular standing/walking, helping people perform ADL's. Denies falls since being home. Worst pain 8/10 NPS in L hip at surgical site. Reports burning pain along deep peroneal nerve distribution starting at fibular head traveling along lateral aspect of lower leg to dorsal part of foot. Pt with hypersensitivity to light touch and tenderness at fibular head.  PAIN:  Are you having pain? Yes: NPRS scale: 4/10 Pain location: L hip surgical site Pain description: burning in foot,  Aggravating factors: some LLE mobility but tolerable, touching L foot.  Relieving factors: Medication,    PRECAUTIONS: Other: TDWB with LLE, ( posterior hip precautions  )  WEIGHT BEARING RESTRICTIONS: Yes TDWB 8 weeks from 06/30/22, post hip precautions 12 weeks from 06/30/22  FALLS:  Has patient fallen in last 6 months? No  LIVING ENVIRONMENT: Lives with: lives with their family Lives in: House/apartment Stairs: Yes: Internal: flight steps; on right going up and External: 1 steps; none Has following equipment at home: Walker - 2 wheeled and bed side commode  OCCUPATION: unemployed before injury. 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 Reports needing to be able to be able to walk, help with group home ADL's.   PLOF: Independent  PATIENT GOALS: Return to independent  NEXT MD VISIT: PCP, 07/20/22 and Dr. Carola Frost on 07/27/22  OBJECTIVE:   DIAGNOSTIC FINDINGS:  CLINICAL DATA:  Postoperative status.   EXAM: DG HIP (WITH OR WITHOUT PELVIS) 1V*L*   COMPARISON:  CT scan of same day.   FINDINGS: No dislocation is noted. There appears to be improved alignment involving the posterior acetabular fracture noted on prior CT scan. No other bony abnormality is noted.   IMPRESSION: No dislocation is noted. Probable improved alignment involving posterior acetabular fracture on the left.     Electronically Signed   By: Lupita Raider M.D.  PATIENT SURVEYS:  FOTO Deferred to next session  COGNITION: Overall cognitive status: Within functional limits for tasks assessed     SENSATION: Light touch: Impaired  L4-L5 on L foot (great toe, medial malleolus)    POSTURE: No Significant postural limitations  PALPATION: TTP along LLE fibular head along deep peroneal nerve to dorsum of foot  LOWER EXTREMITY ROM:  Active  ROM Right eval Left eval  Hip flexion  90  Hip extension    Hip abduction    Hip adduction    Hip internal rotation    Hip external rotation 40 28  Knee flexion    Knee extension 0 0  Ankle dorsiflexion 10 Unable due to L foot drop  Ankle plantarflexion 55 42  Ankle inversion 35 20  Ankle eversion 20 unable   (Blank rows = not tested)   LLE PROM ankle DF/gastroc muscle length: 82 degrees  LOWER EXTREMITY MMT:  MMT Right eval Left eval  Hip flexion 5 5  Hip extension    Hip abduction 5 4  Hip adduction 5 5  Hip internal rotation    Hip external rotation    Knee flexion 5 5  Knee extension 5 5  Ankle dorsiflexion 5 0  Ankle plantarflexion 5 5  Ankle inversion 5 4  Ankle eversion 5 0   (Blank rows = not tested)  LOWER EXTREMITY SPECIAL TESTS:   N/A  FUNCTIONAL TESTS:  5 times sit to stand: 11.33 sec, needs BUE support, TDWB on LLE Timed up and go (TUG): 13.95 seconds with RW 10 meter walk test: .6 m/s using RW and TDWB on LLE   GAIT: Distance walked: 10 meters Assistive device utilized: Walker - 2 wheeled Level of assistance: Modified independence Comments: LLE TTWB using step to pattern with RW.   TODAY'S TREATMENT:                                                                                                                              DATE: 08/05/22   *attempts made for L foot drop NMES during today's session on [place] group system with 2x2 x2 electrodes placed on anterior tibialis and peroneus longus. Phase duration 200 micro seconds, cycle time 4/12, frequency 35 pps, ramp time 2 sec, mA trialed up to 80. Only able to elicit sensation at electrode placement, no L ankle DF or eversion noted. Will continue to attempt at f/u session.* Unbilled.    There.ex:   LLE hamstring stretch with strap: 3x30 sec. VC's for keeping knee extended with good carryover    Side lying L hip abduction no resistance: 3x8. Mod multimodal cuing for form/technique.    4# AW exercises: 3x8/exercise   SAQ   Prone hip extension    Prone knee flexion    Standing hip flexion    Standing hip abduction    Standing hip extension      Reviewed updated HEP with pt including hamstring stretching.   PATIENT EDUCATION:  Education details: Education on importance of L ankle mobility in case pt will need AFO in the future for L foot drop to prevent gastrocnemius contracture. Reviewed post hip precautions and TDWB precautions and importance to comply for tissue healing.  Person educated: Patient Education method: Medical illustrator Education comprehension: verbalized understanding, returned demonstration, and needs further education  HOME EXERCISE PROGRAM:  Access Code: [email or web address] URL: [email or web address] Date:  08/05/2022 Prepared by: Ronnie Derby  Exercises - Prone Hip Extension  - 1 x daily - 3-4 x weekly - 3 sets - 8 reps - Clamshell  - 1 x daily - 3-4 x weekly - 3 sets - 8 reps - Prone Hamstring Curl with Anchored Resistance  - 1 x daily - 3-4 x weekly - 3 sets - 8 reps - Supine Active Straight Leg Raise  - 1 x daily - 3-4 x weekly - 3 sets - 8 reps - Supine Hamstring Stretch with Strap  - 1 x daily - 7 x weekly - 1 sets - 3 reps - 30 hold  Access Code: [email or web address] URL: [email or web address] Date: 07/22/2022 Prepared by: Ronnie Derby  Exercises - Prone Hip Extension  - 1 x daily - 3-4 x weekly - 3 sets - 8 reps - Clamshell  - 1 x daily - 3-4 x weekly - 3 sets - 8 reps - Prone Hamstring Curl with Anchored Resistance  - 1 x daily - 3-4 x weekly - 3 sets - 8 reps - Supine Active Straight Leg Raise  - 1 x daily - 3-4 x weekly - 3 sets - 8 reps    Ankle pumps seated, LLE heel raise due to L foot drop: x20/LE    Supine gastroc stretch LLE with belt: 3x30 sec    Quad set LLE: x15   Heel slides LLE: x15 maintaining post hip precautions    SAQ's LLE: x15   Hip abduction supine: x15   Seated    LAQ's: x15/LLE   Standing:    LLE hip flexion maintaining post hip precautions: x15   LLE hip abduction: x15   LLE hip extension: x15  ASSESSMENT:  CLINICAL IMPRESSION: Continuing PT POC. PT displaying improved confidence and balance with mobility transitioning to crutches independently. Pt remains TTWB on LLE thus remaining limited in [place] LLE strengthening exercises. Pt remains weak particularly in hip abduction against gravity but displays reduced tremulous activity and improved motor control compared to prior session. Foot drop remains ISQ. Attempts made today on NMES for L foot drop but unable to elicit response leading to ankle DF with NMES settings.  Pt will benefit from skilled PT services to address these impairments to reduce falls risk and maximize return to PLOF.    OBJECTIVE IMPAIRMENTS: Abnormal gait, decreased activity tolerance, decreased balance, decreased mobility, difficulty walking, decreased ROM, decreased strength, impaired flexibility, impaired sensation, and pain.   ACTIVITY LIMITATIONS: carrying, lifting, bending, sitting, standing, squatting, sleeping, stairs, bathing, toileting, dressing, locomotion level, and caring for others  PARTICIPATION LIMITATIONS: cleaning, interpersonal relationship, driving, shopping, community activity, occupation, and yard work  PERSONAL FACTORS: Age, Fitness, Past/current experiences, Profession, Time since onset of injury/illness/exacerbation, and 3+ comorbidities: Alcohol use disorder (in early remission), CHF, cardiomyopathy, depression, anxiety  are also affecting patient's functional outcome.   REHAB POTENTIAL: Fair LLE foot drop, visit limit cap of 30 visits, multiple WB and hip precautions  CLINICAL DECISION MAKING: Evolving/moderate complexity  EVALUATION COMPLEXITY: Moderate   GOALS: Goals reviewed with patient? No  SHORT TERM GOALS: Target date: 08/25/22 Pt will be independent with HEP to improve L hip AROM and LLE strength to return to normalized gait mechanics with LRAD. Baseline: 07/14/22: HEP provided/discussed Goal status: INITIAL  2.  Pt will be able to ambulate > 500' with LRAD once WBAT, to complete household and short community distances.  Baseline: 07/14/22: TDWB on LLE, using RW only completing household  distances. Goal status: INITIAL  LONG TERM GOALS: Target date: 10/06/22  Pt will improve FOTO to target score to demonstrate clinically significant improvement in functional mobility. Baseline: 07/14/22: defer to next session Goal status: INITIAL  2.  Pt will improve TUG to < 12 sec with no AD to demo reduced falls risk and safe ability toc complete STS and stand to sit transfer without need for DME. Baseline: 07/14/22: LLE TDWB using RW at 13.95 seconds Goal status: INITIAL  3.   Pt will improve 5xSTS in < 12 seconds with LLE WBAT and no UE support to demo significant improvement in LE strength and reduced falls risk. Baseline: 07/14/22: 11.33 seconds maintaining LLE TDWB and BUE support on arm rests Goal status: INITIAL  4.  Pt will improve 10 meter gait speed to >/= 1.2 m/s with LRAD to demo ability to ambulate at safe velocity to return to mod-I completion of community walking tasks.  Baseline: 07/14/22: LLE TDWB using RW completing at .6 m/s Goal status: INITIAL  5.  Pt will be able to asc/desc full flight of stairs with reciprocal pattern without rails to return to complete independence with accessing second story of parent's home to access his bedroom. Baseline: 07/14/22: unable to complete due to WB status Goal status: INITIAL  PLAN:  PT FREQUENCY: 1-2x/week  PT DURATION: 12 weeks  PLANNED INTERVENTIONS: Therapeutic exercises, Therapeutic activity, Neuromuscular re-education, Balance training, Gait training, Patient/Family education, Self Care, Joint mobilization, Stair training, Vestibular training, Canalith repositioning, Orthotic/Fit training, DME instructions, Dry Needling, Electrical stimulation, Spinal mobilization, Cryotherapy, Moist heat, scar mobilization, Manual therapy, and Re-evaluation  PLAN FOR NEXT SESSION: NMES for L foot drop, LLE strength/mobility with AW's   Delphia Grates. Fairly IV, PT, DPT Physical Therapist- [place]  [facility]  08/05/2022, 9:23 AM

## 2022-08-12 ENCOUNTER — Ambulatory Visit: Payer: BC Managed Care – PPO

## 2022-08-12 DIAGNOSIS — F331 Major depressive disorder, recurrent, moderate: Secondary | ICD-10-CM | POA: Diagnosis not present

## 2022-08-19 DIAGNOSIS — S32462D Displaced associated transverse-posterior fracture of left acetabulum, subsequent encounter for fracture with routine healing: Secondary | ICD-10-CM | POA: Diagnosis not present

## 2022-08-19 DIAGNOSIS — S73015D Posterior dislocation of left hip, subsequent encounter: Secondary | ICD-10-CM | POA: Diagnosis not present

## 2022-08-19 DIAGNOSIS — F331 Major depressive disorder, recurrent, moderate: Secondary | ICD-10-CM | POA: Diagnosis not present

## 2022-08-20 ENCOUNTER — Ambulatory Visit: Payer: BC Managed Care – PPO

## 2022-08-20 DIAGNOSIS — M25552 Pain in left hip: Secondary | ICD-10-CM | POA: Diagnosis not present

## 2022-08-20 DIAGNOSIS — R262 Difficulty in walking, not elsewhere classified: Secondary | ICD-10-CM

## 2022-08-20 DIAGNOSIS — M6281 Muscle weakness (generalized): Secondary | ICD-10-CM | POA: Diagnosis not present

## 2022-08-20 NOTE — Therapy (Signed)
OUTPATIENT PHYSICAL THERAPY LOWER EXTREMITY TREATMENT   Patient Name: Thomas Williamson MRN: [id number] DOB:Jun 20, 1975, 47 y.o., male Today's Date: 08/20/2022  END OF SESSION:  PT End of Session - 08/20/22 1432     Visit Number 5    Number of Visits 25    Date for PT Re-Evaluation 10/06/22    PT Start Time 1431    PT Stop Time 1514    PT Time Calculation (min) 43 min    Equipment Utilized During Treatment Gait belt    Activity Tolerance Patient tolerated treatment well    Behavior During Therapy WFL for tasks assessed/performed             Past Medical History:  Diagnosis Date   Acetabulum fracture, left (HCC) 06/30/2022   Alcohol use disorder, severe, in early remission (HCC) 03/30/2018   Allergy    Anxiety    Bipolar disorder, in partial remission, most recent episode hypomanic (HCC) 03/30/2018   [facility] Dr[facility]a [facility]   Bleeding internal hemorrhoids 09/12/2013   Cannabis dependence (HCC) 07/13/2018   Daily use   Cervical disc disease 11/20/2014   Chronic anxiety 07/13/2018   Chronic systolic (congestive) heart failure (HCC)    Complication of anesthesia    Depression    Dyslipidemia    GERD (gastroesophageal reflux disease)    Hernia, inguinal, bilateral 02/24/2011   High ankle sprain of right lower extremity 01/16/2015   History of hiatal hernia    Hx of hepatitis C 10/05/2017   -treated in 2019 -hepatology recommended no further surveillance needed except for LFTs with labs and see hepatologist if elevated   Hypertension    Lipids abnormal 09/29/2013   Pneumonia    PONV (postoperative nausea and vomiting)    Vitamin D deficiency 07/01/2022   Past Surgical History:  Procedure Laterality Date   ANTERIOR CRUCIATE LIGAMENT REPAIR  2010   BIOPSY  03/13/2019   Procedure: BIOPSY;  Surgeon: Benancio Deeds, MD;  Location: [facility];  Service: Gastroenterology;;   COLONOSCOPY N/A 03/13/2019   per Dr. Adela Lank, adenomatous polyps,  repeat in 3 yrs   INGUINAL HERNIA REPAIR  02/23/2012   Procedure: LAPAROSCOPIC BILATERAL INGUINAL HERNIA REPAIR;  Surgeon: Kandis Cocking, MD;  Location: [facility];  Service: General;  Laterality: Bilateral;  Laparoscopic Bilateral Inguinal Hernia Repair with mesh   INSERTION OF MESH  02/23/2012   Procedure: INSERTION OF MESH;  Surgeon: Kandis Cocking, MD;  Location: [facility];  Service: General;  Laterality: N/A;   NOSE SURGERY  [id number]   ORIF ACETABULAR FRACTURE Left 06/30/2022   Procedure: OPEN REDUCTION INTERNAL FIXATION (ORIF) ACETABULAR FRACTURE;  Surgeon: Myrene Galas, MD;  Location: [facility];  Service: Orthopedics;  Laterality: Left;   POLYPECTOMY  03/13/2019   Procedure: POLYPECTOMY;  Surgeon: Benancio Deeds, MD;  Location: [facility];  Service: Gastroenterology;;   Patient Active Problem List   Diagnosis Date Noted   Abnormal LFTs 07/08/2022   Left foot drop 07/01/2022   Vitamin D deficiency 07/01/2022   Acetabulum fracture, left (HCC) 06/30/2022   Alcohol dependence (HCC) 01/05/2022   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   Alcohol-induced acute pancreatitis without infection or necrosis 12/05/2021   Genetic testing 04/21/2019   Benign neoplasm of colon    Loose stools    History of colonic polyps 03/07/2019   Cardiac left ventricular ejection fraction 30-35 percent 03/07/2019   Alcoholic ketoacidosis 11/18/2018   Dyslipidemia 09/16/2018   Fever blister 09/16/2018   Cardiomyopathy,  alcoholic (HCC) 09/16/2018   Left bundle branch block (LBBB) 09/16/2018   Cannabis dependence (HCC) 07/13/2018   Chronic anxiety 07/13/2018   Bipolar 1 disorder, depressed, full remission (HCC) 03/30/2018   Cervical disc disease 11/20/2014   Hypertension 02/24/2011    PCP: Nelwyn Salisbury, MD  REFERRING PROVIDER: Myrene Galas, MD  REFERRING DIAG: S72.009A (ICD-10-CM) - Hip fracture [facility])  THERAPY DIAG:  Muscle weakness (generalized)  Pain in left hip  Difficulty  in walking, not elsewhere classified  Rationale for Evaluation and Treatment: Rehabilitation  ONSET DATE: 06/30/22  SUBJECTIVE:   SUBJECTIVE STATEMENT: Pt reports he followed up with Dr. Carola Frost. Reports MD stated he can begin walking with single crutch Wb'ing. No activation present in L ankle DF and eversion.   PERTINENT HISTORY: Pt is a pleasant 47 y.o. male s/p L acetabular fracture repair and hip dislocation after fall walking dog down stairs. Pt with subsequent LLE foot drop still since operation on 06/30/22. Reports managing well at home, just wants to be able to learn how to asc/desc steps and return to independence. Currently unemployed but has a job lined up for when he recovers that will require regular standing/walking, helping people perform ADL's. Denies falls since being home. Worst pain 8/10 NPS in L hip at surgical site. Reports burning pain along deep peroneal nerve distribution starting at fibular head traveling along lateral aspect of lower leg to dorsal part of foot. Pt with hypersensitivity to light touch and tenderness at fibular head.  PAIN:  Are you having pain? Yes: NPRS scale: 4/10 Pain location: L hip surgical site Pain description: burning in foot,  Aggravating factors: some LLE mobility but tolerable, touching L foot.  Relieving factors: Medication,    PRECAUTIONS: Other: TDWB with LLE, ( posterior hip precautions  )  WEIGHT BEARING RESTRICTIONS: Yes TDWB 8 weeks from 06/30/22, post hip precautions 12 weeks from 06/30/22  FALLS:  Has patient fallen in last 6 months? No  LIVING ENVIRONMENT: Lives with: lives with their family Lives in: House/apartment Stairs: Yes: Internal: flight steps; on right going up and External: 1 steps; none Has following equipment at home: Walker - 2 wheeled and bed side commode  OCCUPATION: unemployed before injury. 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 Reports needing to be able to be able to walk, help with  group home ADL's.   PLOF: Independent  PATIENT GOALS: Return to independent  NEXT MD VISIT: PCP, 07/20/22 and Dr. Carola Frost on 07/27/22  OBJECTIVE:   DIAGNOSTIC FINDINGS:  CLINICAL DATA:  Postoperative status.   EXAM: DG HIP (WITH OR WITHOUT PELVIS) 1V*L*   COMPARISON:  CT scan of same day.   FINDINGS: No dislocation is noted. There appears to be improved alignment involving the posterior acetabular fracture noted on prior CT scan. No other bony abnormality is noted.   IMPRESSION: No dislocation is noted. Probable improved alignment involving posterior acetabular fracture on the left.     Electronically Signed   By: Lupita Raider M.D.  PATIENT SURVEYS:  FOTO Deferred to next session  COGNITION: Overall cognitive status: Within functional limits for tasks assessed     SENSATION: Light touch: Impaired  L4-L5 on L foot (great toe, medial malleolus)    POSTURE: No Significant postural limitations  PALPATION: TTP along LLE fibular head along deep peroneal nerve to dorsum of foot  LOWER EXTREMITY ROM:  Active ROM Right eval Left eval  Hip flexion  90  Hip extension  Hip abduction    Hip adduction    Hip internal rotation    Hip external rotation 40 28  Knee flexion    Knee extension 0 0  Ankle dorsiflexion 10 Unable due to L foot drop  Ankle plantarflexion 55 42  Ankle inversion 35 20  Ankle eversion 20 unable   (Blank rows = not tested)   LLE PROM ankle DF/gastroc muscle length: 82 degrees  LOWER EXTREMITY MMT:  MMT Right eval Left eval  Hip flexion 5 5  Hip extension    Hip abduction 5 4  Hip adduction 5 5  Hip internal rotation    Hip external rotation    Knee flexion 5 5  Knee extension 5 5  Ankle dorsiflexion 5 0  Ankle plantarflexion 5 5  Ankle inversion 5 4  Ankle eversion 5 0   (Blank rows = not tested)  LOWER EXTREMITY SPECIAL TESTS:  N/A  FUNCTIONAL TESTS:  5 times sit to stand: 11.33 sec, needs BUE support, TDWB on  LLE Timed up and go (TUG): 13.95 seconds with RW 10 meter walk test: .6 m/s using RW and TDWB on LLE   GAIT: Distance walked: 10 meters Assistive device utilized: Walker - 2 wheeled Level of assistance: Modified independence Comments: LLE TTWB using step to pattern with RW.   TODAY'S TREATMENT:                                                                                                                              DATE: 08/20/22    Gait training:   Single crutch in RUE, CGA. 3 point step to pattern 100'. 2 point pattern on last 100'. Heavy R sided lean onto crutch. Able to perform 100' lap with improved upright posture. Remains unable to perform heel strike with LLE due to foot drop.     There.ex:   OMEGA exercises:    Knee extension:     Bilat, 2x8, 45#     3 RM (R/L): 75#/ 45#   Knee flexion:     Bilat 3x8, 55#   Side lying L hip abduction no resistance: 3x8. Improved eccentric control.    Side lying L hip clam shell with RTB: 2x8. Provided RTB for HEP.   PATIENT EDUCATION:  Education details: Education on importance of L ankle mobility in case pt will need AFO in the future for L foot drop to prevent gastrocnemius contracture. Reviewed post hip precautions and TDWB precautions and importance to comply for tissue healing.  Person educated: Patient Education method: Medical illustrator Education comprehension: verbalized understanding, returned demonstration, and needs further education  HOME EXERCISE PROGRAM: Access Code: [email or web address] URL: [email or web address] Date: 08/05/2022 Prepared by: Ronnie Derby  Exercises - Prone Hip Extension  - 1 x daily - 3-4 x weekly - 3 sets - 8 reps - Clamshell  - 1 x daily - 3-4 x weekly - 3 sets - 8 reps - Prone Hamstring Curl with  Anchored Resistance  - 1 x daily - 3-4 x weekly - 3 sets - 8 reps - Supine Active Straight Leg Raise  - 1 x daily - 3-4 x weekly - 3 sets - 8 reps - Supine Hamstring Stretch with  Strap  - 1 x daily - 7 x weekly - 1 sets - 3 reps - 30 hold  Access Code: [email or web address] URL: [email or web address] Date: 07/22/2022 Prepared by: Ronnie Derby  Exercises - Prone Hip Extension  - 1 x daily - 3-4 x weekly - 3 sets - 8 reps - Clamshell  - 1 x daily - 3-4 x weekly - 3 sets - 8 reps - Prone Hamstring Curl with Anchored Resistance  - 1 x daily - 3-4 x weekly - 3 sets - 8 reps - Supine Active Straight Leg Raise  - 1 x daily - 3-4 x weekly - 3 sets - 8 reps    Ankle pumps seated, LLE heel raise due to L foot drop: x20/LE    Supine gastroc stretch LLE with belt: 3x30 sec    Quad set LLE: x15   Heel slides LLE: x15 maintaining post hip precautions    SAQ's LLE: x15   Hip abduction supine: x15   Seated    LAQ's: x15/LLE   Standing:    LLE hip flexion maintaining post hip precautions: x15   LLE hip abduction: x15   LLE hip extension: x15  ASSESSMENT:  CLINICAL IMPRESSION: Pt arriving with updated verbal WB'ing orders from MD. Pt with excellent 2 point gait pattern with single crutch without pain today. Progressed to quad and hamstring strengthening on OMEGA to load quad's and hamstrings in prep for full, WBAT. Anticipate pt will remain with functionally weak L quad with gait due to L foot drop preventing TKE at initial contact. 3RM performed for quads with expected, but notable L quad atrophy and weakness compared to RLE. Pt is progressing in his hip abduction strength with improved eccentric control and no notable tremulous activity. Pt will benefit from skilled PT services to address these impairments to reduce falls risk and maximize return to PLOF.   OBJECTIVE IMPAIRMENTS: Abnormal gait, decreased activity tolerance, decreased balance, decreased mobility, difficulty walking, decreased ROM, decreased strength, impaired flexibility, impaired sensation, and pain.   ACTIVITY LIMITATIONS: carrying, lifting, bending, sitting, standing, squatting, sleeping, stairs,  bathing, toileting, dressing, locomotion level, and caring for others  PARTICIPATION LIMITATIONS: cleaning, interpersonal relationship, driving, shopping, community activity, occupation, and yard work  PERSONAL FACTORS: Age, Fitness, Past/current experiences, Profession, Time since onset of injury/illness/exacerbation, and 3+ comorbidities: Alcohol use disorder (in early remission), CHF, cardiomyopathy, depression, anxiety  are also affecting patient's functional outcome.   REHAB POTENTIAL: Fair LLE foot drop, visit limit cap of 30 visits, multiple WB and hip precautions  CLINICAL DECISION MAKING: Evolving/moderate complexity  EVALUATION COMPLEXITY: Moderate   GOALS: Goals reviewed with patient? No  SHORT TERM GOALS: Target date: 08/25/22 Pt will be independent with HEP to improve L hip AROM and LLE strength to return to normalized gait mechanics with LRAD. Baseline: 07/14/22: HEP provided/discussed Goal status: INITIAL  2.  Pt will be able to ambulate > 500' with LRAD once WBAT, to complete household and short community distances.  Baseline: 07/14/22: TDWB on LLE, using RW only completing household distances. Goal status: INITIAL  LONG TERM GOALS: Target date: 10/06/22  Pt will improve FOTO to target score to demonstrate clinically significant improvement in functional mobility. Baseline: 07/14/22: defer to next session Goal status: INITIAL  2.  Pt will improve TUG to < 12 sec with no AD to demo reduced falls risk and safe ability toc complete STS and stand to sit transfer without need for DME. Baseline: 07/14/22: LLE TDWB using RW at 13.95 seconds Goal status: INITIAL  3.  Pt will improve 5xSTS in < 12 seconds with LLE WBAT and no UE support to demo significant improvement in LE strength and reduced falls risk. Baseline: 07/14/22: 11.33 seconds maintaining LLE TDWB and BUE support on arm rests Goal status: INITIAL  4.  Pt will improve 10 meter gait speed to >/= 1.2 m/s with LRAD to  demo ability to ambulate at safe velocity to return to mod-I completion of community walking tasks.  Baseline: 07/14/22: LLE TDWB using RW completing at .6 m/s Goal status: INITIAL  5.  Pt will be able to asc/desc full flight of stairs with reciprocal pattern without rails to return to complete independence with accessing second story of parent's home to access his bedroom. Baseline: 07/14/22: unable to complete due to WB status Goal status: INITIAL  PLAN:  PT FREQUENCY: 1-2x/week  PT DURATION: 12 weeks  PLANNED INTERVENTIONS: Therapeutic exercises, Therapeutic activity, Neuromuscular re-education, Balance training, Gait training, Patient/Family education, Self Care, Joint mobilization, Stair training, Vestibular training, Canalith repositioning, Orthotic/Fit training, DME instructions, Dry Needling, Electrical stimulation, Spinal mobilization, Cryotherapy, Moist heat, scar mobilization, Manual therapy, and Re-evaluation  PLAN FOR NEXT SESSION: Gait progression as allowed. LLE strength/mobility at [employer]. Fairly IV, PT, DPT Physical Therapist- [facility]  08/20/2022, 3:39 PM

## 2022-08-24 DIAGNOSIS — F332 Major depressive disorder, recurrent severe without psychotic features: Secondary | ICD-10-CM | POA: Diagnosis not present

## 2022-08-24 DIAGNOSIS — F1029 Alcohol dependence with unspecified alcohol-induced disorder: Secondary | ICD-10-CM | POA: Diagnosis not present

## 2022-08-24 DIAGNOSIS — F411 Generalized anxiety disorder: Secondary | ICD-10-CM | POA: Diagnosis not present

## 2022-08-25 ENCOUNTER — Ambulatory Visit: Payer: BC Managed Care – PPO

## 2022-08-25 DIAGNOSIS — M6281 Muscle weakness (generalized): Secondary | ICD-10-CM

## 2022-08-25 DIAGNOSIS — R262 Difficulty in walking, not elsewhere classified: Secondary | ICD-10-CM

## 2022-08-25 DIAGNOSIS — M25552 Pain in left hip: Secondary | ICD-10-CM | POA: Diagnosis not present

## 2022-08-25 NOTE — Therapy (Signed)
OUTPATIENT PHYSICAL THERAPY LOWER EXTREMITY TREATMENT   Patient Name: Thomas Williamson MRN: [id number] DOB:[birth year], 47 y.o., male Today's Date: 08/25/2022  END OF SESSION:  PT End of Session - 08/25/22 1304     Visit Number 6    Number of Visits 25    Date for PT Re-Evaluation 10/06/22    PT Start Time 1301    PT Stop Time 1345    PT Time Calculation (min) 44 min    Equipment Utilized During Treatment Gait belt    Activity Tolerance Patient tolerated treatment well    Behavior During Therapy WFL for tasks assessed/performed             Past Medical History:  Diagnosis Date   Acetabulum fracture, left (HCC) 06/30/2022   Alcohol use disorder, severe, in early remission (HCC) 03/30/2018   Allergy    Anxiety    Bipolar disorder, in partial remission, most recent episode hypomanic (HCC) 03/30/2018   [facility]   Bleeding internal hemorrhoids 09/12/2013   Cannabis dependence (HCC) 07/13/2018   Daily use   Cervical disc disease 11/20/2014   Chronic anxiety 07/13/2018   Chronic systolic (congestive) heart failure (HCC)    Complication of anesthesia    Depression    Dyslipidemia    GERD (gastroesophageal reflux disease)    Hernia, inguinal, bilateral 02/24/2011   High ankle sprain of right lower extremity 01/16/2015   History of hiatal hernia    Hx of hepatitis C 10/05/2017   -treated in 2019 -hepatology recommended no further surveillance needed except for LFTs with labs and see hepatologist if elevated   Hypertension    Lipids abnormal 09/29/2013   Pneumonia    PONV (postoperative nausea and vomiting)    Vitamin D deficiency 07/01/2022   Past Surgical History:  Procedure Laterality Date   ANTERIOR CRUCIATE LIGAMENT REPAIR  2010   BIOPSY  03/13/2019   Procedure: BIOPSY;  Surgeon: Benancio Deeds, MD;  Location: [facility];  Service: Gastroenterology;;   COLONOSCOPY N/A 03/13/2019   per Dr. Adela Lank, adenomatous polyps,  repeat in 3 yrs   INGUINAL HERNIA REPAIR  02/23/2012   Procedure: LAPAROSCOPIC BILATERAL INGUINAL HERNIA REPAIR;  Surgeon: Kandis Cocking, MD;  Location: [facility];  Service: General;  Laterality: Bilateral;  Laparoscopic Bilateral Inguinal Hernia Repair with mesh   INSERTION OF MESH  02/23/2012   Procedure: INSERTION OF MESH;  Surgeon: Kandis Cocking, MD;  Location: [facility];  Service: General;  Laterality: N/A;   NOSE SURGERY  [id number],[id number]   ORIF ACETABULAR FRACTURE Left 06/30/2022   Procedure: OPEN REDUCTION INTERNAL FIXATION (ORIF) ACETABULAR FRACTURE;  Surgeon: Myrene Galas, MD;  Location: [facility];  Service: Orthopedics;  Laterality: Left;   POLYPECTOMY  03/13/2019   Procedure: POLYPECTOMY;  Surgeon: Benancio Deeds, MD;  Location: [facility];  Service: Gastroenterology;;   Patient Active Problem List   Diagnosis Date Noted   Abnormal LFTs 07/08/2022   Left foot drop 07/01/2022   Vitamin D deficiency 07/01/2022   Acetabulum fracture, left (HCC) 06/30/2022   Alcohol dependence (HCC) 01/05/2022   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   Alcohol-induced acute pancreatitis without infection or necrosis 12/05/2021   Genetic testing 04/21/2019   Benign neoplasm of colon    Loose stools    History of colonic polyps 03/07/2019   Cardiac left ventricular ejection fraction 30-35 percent 03/07/2019   Alcoholic ketoacidosis 11/18/2018   Dyslipidemia 09/16/2018   Fever blister 09/16/2018   Cardiomyopathy,  alcoholic (HCC) 09/16/2018   Left bundle branch block (LBBB) 09/16/2018   Cannabis dependence (HCC) 07/13/2018   Chronic anxiety 07/13/2018   Bipolar 1 disorder, depressed, full remission (HCC) 03/30/2018   Cervical disc disease 11/20/2014   Hypertension 02/24/2011    PCP: Nelwyn Salisbury, MD  REFERRING PROVIDER: Myrene Galas, MD  REFERRING DIAG: S72.009A (ICD-10-CM) - Hip fracture [facility])  THERAPY DIAG:  Muscle weakness (generalized)  Pain in left hip  Difficulty  in walking, not elsewhere classified  Rationale for Evaluation and Treatment: Rehabilitation  ONSET DATE: 06/30/22  SUBJECTIVE:   SUBJECTIVE STATEMENT: Pt reports his L ankle brace has lost some of it's padding leading him to slide in his brace. Denies falls. Still having L ankle and foot nerve pain. L great toe in a lot of pain upwards to 8/10 NPS in terminal stance/push off phase of gait.  PERTINENT HISTORY: Pt is a pleasant 47 y.o. male s/p L acetabular fracture repair and hip dislocation after fall walking dog down stairs. Pt with subsequent LLE foot drop still since operation on 06/30/22. Reports managing well at home, just wants to be able to learn how to asc/desc steps and return to independence. Currently unemployed but has a job lined up for when he recovers that will require regular standing/walking, helping people perform ADL's. Denies falls since being home. Worst pain 8/10 NPS in L hip at surgical site. Reports burning pain along deep peroneal nerve distribution starting at fibular head traveling along lateral aspect of lower leg to dorsal part of foot. Pt with hypersensitivity to light touch and tenderness at fibular head.  PAIN:  Are you having pain? Yes: NPRS scale: 0/10 Pain location: L hip surgical site Pain description: burning in foot,  Aggravating factors: some LLE mobility but tolerable, touching L foot.  Relieving factors: Medication,    PRECAUTIONS: Other: TDWB with LLE, ( posterior hip precautions  )  WEIGHT BEARING RESTRICTIONS: Yes WBAT weaning to no crutches (08/21/22) , post hip precautions 12 weeks from 06/30/22  FALLS:  Has patient fallen in last 6 months? No  LIVING ENVIRONMENT: Lives with: lives with their family Lives in: House/apartment Stairs: Yes: Internal: flight steps; on right going up and External: 1 steps; none Has following equipment at home: Walker - 2 wheeled and bed side commode  OCCUPATION: unemployed before injury. 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 Reports needing to be able to be able to walk, help with group home ADL's.   PLOF: Independent  PATIENT GOALS: Return to independent  NEXT MD VISIT: PCP, 07/20/22 and Dr. Carola Frost on 07/27/22  OBJECTIVE:   DIAGNOSTIC FINDINGS:  CLINICAL DATA:  Postoperative status.   EXAM: DG HIP (WITH OR WITHOUT PELVIS) 1V*L*   COMPARISON:  CT scan of same day.   FINDINGS: No dislocation is noted. There appears to be improved alignment involving the posterior acetabular fracture noted on prior CT scan. No other bony abnormality is noted.   IMPRESSION: No dislocation is noted. Probable improved alignment involving posterior acetabular fracture on the left.     Electronically Signed   By: Lupita Raider M.D.  PATIENT SURVEYS:  FOTO Deferred to next session  COGNITION: Overall cognitive status: Within functional limits for tasks assessed     SENSATION: Light touch: Impaired  L4-L5 on L foot (great toe, medial malleolus)    POSTURE: No Significant postural limitations  PALPATION: TTP along LLE fibular head along deep peroneal nerve to dorsum  of foot  LOWER EXTREMITY ROM:  Active ROM Right eval Left eval  Hip flexion  90  Hip extension    Hip abduction    Hip adduction    Hip internal rotation    Hip external rotation 40 28  Knee flexion    Knee extension 0 0  Ankle dorsiflexion 10 Unable due to L foot drop  Ankle plantarflexion 55 42  Ankle inversion 35 20  Ankle eversion 20 unable   (Blank rows = not tested)   LLE PROM ankle DF/gastroc muscle length: 82 degrees  LOWER EXTREMITY MMT:  MMT Right eval Left eval  Hip flexion 5 5  Hip extension    Hip abduction 5 4  Hip adduction 5 5  Hip internal rotation    Hip external rotation    Knee flexion 5 5  Knee extension 5 5  Ankle dorsiflexion 5 0  Ankle plantarflexion 5 5  Ankle inversion 5 4  Ankle eversion 5 0   (Blank rows = not tested)  LOWER EXTREMITY SPECIAL TESTS:   N/A  FUNCTIONAL TESTS:  5 times sit to stand: 11.33 sec, needs BUE support, TDWB on LLE Timed up and go (TUG): 13.95 seconds with RW 10 meter walk test: .6 m/s using RW and TDWB on LLE   GAIT: Distance walked: 10 meters Assistive device utilized: Walker - 2 wheeled Level of assistance: Modified independence Comments: LLE TTWB using step to pattern with RW.   TODAY'S TREATMENT:                                                                                                                              DATE: 08/25/22    Gait training:   Single crutch in RUE, SBA. 2 point pattern on last 100' with improved upright posture. Transitioned to no crutches ambulating 200' with L brace donned. SBA. Instances of L foot sliding in brace leading to mild unsteadiness.   Educated pt to attempt transitioning to normal footwear on LLE to reduce sliding and falls in current ankle brace until he receives AFO. Educated importance of foot clearance and placement when attempting due to L foot drop and no eversion of L ankle currently. PT understanding. Notable ability to safely clear foot in clinic at supervision level with gait today in clinic.   X6 hurdles, reciprocal forward gait. X6 laps CGA. Progressed to R/L lateral side steps over hurdle. X3 laps/direction. CGA.    Manual therapy: 8 minute pt seated  L great toe gentle joint distraction: 3x30 sec holds   L MTP/first ray AP grade 3/4 mobs x10 bouts, 5 sec holds/bout to improve joint mobility/passive toe extension in terminal stance of gait cycle with reduced pain.   L great toe flexor stretch passively: 3x 30 seconds    Educated on Great toe stretches at home. Pain reports from 8/10 NPS to 5/10 NPS with walking re-test post manual intervention.    There.exBritt Bottom  at TM bar with light BUE support: 3x10 with chair behind pt for TC    6" step ups with SUE support: 3x12/LE     Asc/desc 4 steps with reciprocal pattern and single rail use. SBA.  Good stability and balance.   PATIENT EDUCATION:  Education details: Education on importance of L ankle mobility in case pt will need AFO in the future for L foot drop to prevent gastrocnemius contracture. Reviewed post hip precautions and TDWB precautions and importance to comply for tissue healing.  Person educated: Patient Education method: Medical illustrator Education comprehension: verbalized understanding, returned demonstration, and needs further education  HOME EXERCISE PROGRAM: Access Code: [email or web address] URL: [email or web address] Date: 08/05/2022 Prepared by: Ronnie Derby  Exercises - Prone Hip Extension  - 1 x daily - 3-4 x weekly - 3 sets - 8 reps - Clamshell  - 1 x daily - 3-4 x weekly - 3 sets - 8 reps - Prone Hamstring Curl with Anchored Resistance  - 1 x daily - 3-4 x weekly - 3 sets - 8 reps - Supine Active Straight Leg Raise  - 1 x daily - 3-4 x weekly - 3 sets - 8 reps - Supine Hamstring Stretch with Strap  - 1 x daily - 7 x weekly - 1 sets - 3 reps - 30 hold  Access Code: [email or web address] URL: [email or web address] Date: 07/22/2022 Prepared by: Ronnie Derby  Exercises - Prone Hip Extension  - 1 x daily - 3-4 x weekly - 3 sets - 8 reps - Clamshell  - 1 x daily - 3-4 x weekly - 3 sets - 8 reps - Prone Hamstring Curl with Anchored Resistance  - 1 x daily - 3-4 x weekly - 3 sets - 8 reps - Supine Active Straight Leg Raise  - 1 x daily - 3-4 x weekly - 3 sets - 8 reps    Ankle pumps seated, LLE heel raise due to L foot drop: x20/LE    Supine gastroc stretch LLE with belt: 3x30 sec    Quad set LLE: x15   Heel slides LLE: x15 maintaining post hip precautions    SAQ's LLE: x15   Hip abduction supine: x15   Seated    LAQ's: x15/LLE   Standing:    LLE hip flexion maintaining post hip precautions: x15   LLE hip abduction: x15   LLE hip extension: x15  ASSESSMENT:  CLINICAL IMPRESSION: Pt quickly able to progress to WBAT without  crutches and without pain. Pt having L great toe pain in terminal stance of gait due to joint hypomobility at MTP joint due to lack of L ankle DF and great toe extension from peroneal nerve injury. Significant pain reduction noted in terminal stance with walking re-test after manual intervention. Discussed transitioning to normal shoes as pt is at great risk of falls ambulating in current L ankle brace due to loss of fabric on plantar surface where foot articulates leading to pt's L foot sliding in brace causing unsteadiness in clinic. Pt demonstrates excellent L foot clearance and awareness to ambulate safely without brace and using crutch PRN for stability. Pt understanding. Anticipate pt will still benefit from AFO as he has yet to demonstrate any L ankle DF, eversion or great toe extension. Pt will benefit from skilled PT services to address these impairments to reduce falls risk and maximize return to PLOF.   OBJECTIVE IMPAIRMENTS: Abnormal gait, decreased activity tolerance, decreased balance, decreased mobility, difficulty walking, decreased ROM, decreased strength, impaired flexibility,  impaired sensation, and pain.   ACTIVITY LIMITATIONS: carrying, lifting, bending, sitting, standing, squatting, sleeping, stairs, bathing, toileting, dressing, locomotion level, and caring for others  PARTICIPATION LIMITATIONS: cleaning, interpersonal relationship, driving, shopping, community activity, occupation, and yard work  PERSONAL FACTORS: Age, Fitness, Past/current experiences, Profession, Time since onset of injury/illness/exacerbation, and 3+ comorbidities: Alcohol use disorder (in early remission), CHF, cardiomyopathy, depression, anxiety  are also affecting patient's functional outcome.   REHAB POTENTIAL: Fair LLE foot drop, visit limit cap of 30 visits, multiple WB and hip precautions  CLINICAL DECISION MAKING: Evolving/moderate complexity  EVALUATION COMPLEXITY: Moderate   GOALS: Goals  reviewed with patient? No  SHORT TERM GOALS: Target date: 08/25/22 Pt will be independent with HEP to improve L hip AROM and LLE strength to return to normalized gait mechanics with LRAD. Baseline: 07/14/22: HEP provided/discussed; 08/25/22: Performing as prescribed. Goal status: MET  2.  Pt will be able to ambulate > 500' with LRAD once WBAT, to complete household and short community distances.  Baseline: 07/14/22: TDWB on LLE, using RW only completing household distances. Goal status: INITIAL  LONG TERM GOALS: Target date: 10/06/22  Pt will improve FOTO to target score to demonstrate clinically significant improvement in functional mobility. Baseline: 07/14/22: defer to next session Goal status: INITIAL  2.  Pt will improve TUG to < 12 sec with no AD to demo reduced falls risk and safe ability toc complete STS and stand to sit transfer without need for DME. Baseline: 07/14/22: LLE TDWB using RW at 13.95 seconds Goal status: INITIAL  3.  Pt will improve 5xSTS in < 12 seconds with LLE WBAT and no UE support to demo significant improvement in LE strength and reduced falls risk. Baseline: 07/14/22: 11.33 seconds maintaining LLE TDWB and BUE support on arm rests Goal status: INITIAL  4.  Pt will improve 10 meter gait speed to >/= 1.2 m/s with LRAD to demo ability to ambulate at safe velocity to return to mod-I completion of community walking tasks.  Baseline: 07/14/22: LLE TDWB using RW completing at .6 m/s Goal status: INITIAL  5.  Pt will be able to asc/desc full flight of stairs with reciprocal pattern without rails to return to complete independence with accessing second story of parent's home to access his bedroom. Baseline: 07/14/22: unable to complete due to WB status Goal status: INITIAL  PLAN:  PT FREQUENCY: 1-2x/week  PT DURATION: 12 weeks  PLANNED INTERVENTIONS: Therapeutic exercises, Therapeutic activity, Neuromuscular re-education, Balance training, Gait training, Patient/Family  education, Self Care, Joint mobilization, Stair training, Vestibular training, Canalith repositioning, Orthotic/Fit training, DME instructions, Dry Needling, Electrical stimulation, Spinal mobilization, Cryotherapy, Moist heat, scar mobilization, Manual therapy, and Re-evaluation  PLAN FOR NEXT SESSION: WBAT gait progression without crutches. L foot clearance activities. LLE strength/mobility at [facility] machine. CKC glut loading and quad strengthening within posterior hip precautions.   Delphia Grates. Fairly IV, PT, DPT Physical Therapist- [place]  [facility]  08/25/2022, 2:07 PM

## 2022-08-26 DIAGNOSIS — F331 Major depressive disorder, recurrent, moderate: Secondary | ICD-10-CM | POA: Diagnosis not present

## 2022-08-27 ENCOUNTER — Ambulatory Visit: Payer: BC Managed Care – PPO

## 2022-08-27 ENCOUNTER — Emergency Department
Admission: EM | Admit: 2022-08-27 | Discharge: 2022-08-27 | Disposition: A | Discharge status: Home / Self Care | Payer: BC Managed Care – PPO | Attending: Emergency Medicine | Admitting: Emergency Medicine

## 2022-08-27 DIAGNOSIS — F1193 Opioid use, unspecified with withdrawal: Secondary | ICD-10-CM

## 2022-08-27 DIAGNOSIS — R112 Nausea with vomiting, unspecified: Secondary | ICD-10-CM | POA: Diagnosis not present

## 2022-08-27 DIAGNOSIS — F1123 Opioid dependence with withdrawal: Secondary | ICD-10-CM | POA: Diagnosis not present

## 2022-08-27 DIAGNOSIS — R519 Headache, unspecified: Secondary | ICD-10-CM | POA: Insufficient documentation

## 2022-08-27 DIAGNOSIS — R531 Weakness: Secondary | ICD-10-CM | POA: Diagnosis not present

## 2022-08-27 LAB — CBC
HCT: 40.3 % (ref 39.0–52.0)
Hemoglobin: 12.7 g/dL — ABNORMAL LOW (ref 13.0–17.0)
MCH: 29.1 pg (ref 26.0–34.0)
MCHC: 31.5 g/dL (ref 30.0–36.0)
MCV: 92.2 fL (ref 80.0–100.0)
Platelets: 485 10*3/uL — ABNORMAL HIGH (ref 150–400)
RBC: 4.37 MIL/uL (ref 4.22–5.81)
RDW: 13.9 % (ref 11.5–15.5)
WBC: 13.2 10*3/uL — ABNORMAL HIGH (ref 4.0–10.5)
nRBC: 0 % (ref 0.0–0.2)

## 2022-08-27 LAB — LIPASE, BLOOD: Lipase: 26 U/L (ref 11–51)

## 2022-08-27 LAB — COMPREHENSIVE METABOLIC PANEL
ALT: 12 U/L (ref 0–44)
AST: 19 U/L (ref 15–41)
Albumin: 5.1 g/dL — ABNORMAL HIGH (ref 3.5–5.0)
Alkaline Phosphatase: 112 U/L (ref 38–126)
Anion gap: 16 — ABNORMAL HIGH (ref 5–15)
BUN: 12 mg/dL (ref 6–20)
CO2: 19 mmol/L — ABNORMAL LOW (ref 22–32)
Calcium: 10.5 mg/dL — ABNORMAL HIGH (ref 8.9–10.3)
Chloride: 103 mmol/L (ref 98–111)
Creatinine, Ser: 1.02 mg/dL (ref 0.61–1.24)
GFR, Estimated: >60 mL/min (ref >60)
Glucose, Bld: 107 mg/dL — ABNORMAL HIGH (ref 70–99)
Potassium: 3.5 mmol/L (ref 3.5–5.1)
Sodium: 138 mmol/L (ref 135–145)
Total Bilirubin: 0.8 mg/dL (ref 0.3–1.2)
Total Protein: 9 g/dL — ABNORMAL HIGH (ref 6.5–8.1)

## 2022-08-27 MED ORDER — BUPRENORPHINE HCL-NALOXONE HCL 8-2 MG SL SUBL
1.0000 | SUBLINGUAL_TABLET | Freq: Once | SUBLINGUAL | Status: AC
  Administered 2022-08-27: 1 via SUBLINGUAL
  Filled 2022-08-27: qty 1

## 2022-08-27 MED ORDER — ONDANSETRON 4 MG PO TBDP
4.0000 mg | ORAL_TABLET | Freq: Once | ORAL | Status: AC | PRN
  Administered 2022-08-27: 4 mg via ORAL
  Filled 2022-08-27: qty 1

## 2022-08-27 NOTE — ED Triage Notes (Signed)
Pt presents to the ED with nausea, weakness, and dizziness that started around 11am. Pt states that he was taking  5mg  of oxycodone every 5-6 hours for the past two months since he had his hip surgery. States that he stopped taking oxycodone yesterday. Reports that he used to feel like this when he would stop drinking, but has not drank anything in 2 months.

## 2022-08-27 NOTE — ED Notes (Signed)
Pt is sticking his finger down his throat to make himself vomit. RN advised pt that it would not help him. Pt is continuing to stick his finger down his throat. No emesis to be found in vomit bag.

## 2022-08-27 NOTE — ED Provider Notes (Signed)
[facility] Provider Note   Event Date/Time   First MD Initiated Contact with Patient 08/27/22 1640     (approximate) History  Nausea and Weakness  HPI Thomas Williamson is a 47 y.o. male with a past medical history of recent ankle surgery and currently stopped taking his oxycodone this morning who presents complaining of headache, nausea, and lightheadedness.  Patient states this is similar to when he has stopped drinking in the past.  Patient states that he has not tried to taper off his opiates and states that he takes at least 3-4 Percocet per day. ROS: Patient currently denies any vision changes, tinnitus, difficulty speaking, facial droop, sore throat, chest pain, shortness of breath, abdominal pain, dysuria, or weakness/numbness/paresthesias in any extremity   Physical Exam  Triage Vital Signs: ED Triage Vitals  Encounter Vitals Group     BP 08/27/22 1501 (!) 136/99     Systolic BP Percentile --      Diastolic BP Percentile --      Pulse Rate 08/27/22 1501 100     Resp 08/27/22 1501 18     Temp 08/27/22 1501 97.9 F (36.6 C)     Temp Source 08/27/22 1501 Oral     SpO2 08/27/22 1501 100 %     Weight 08/27/22 1502 170 lb (77.1 kg)     Height 08/27/22 1502 5\' 9"  (1.753 m)     Head Circumference --      Peak Flow --      Pain Score 08/27/22 1502 0     Pain Loc --      Pain Education --      Exclude from Growth Chart --    Most recent vital signs: Vitals:   08/27/22 1501 08/27/22 1534  BP: (!) 136/99 (!) 147/90  Pulse: 100   Resp: 18   Temp: 97.9 F (36.6 C)   SpO2: 100%    General: Awake, oriented x4.  Rhinorrhea, conjunctival injection, pupils 5 mm and equally reactive to light and accommodation CV:  Good peripheral perfusion.  Resp:  Normal effort.  Abd:  No distention.  Other:  Middle-aged overweight Caucasian male laying in bed in moderate distress secondary to nausea ED Results / Procedures / Treatments  Labs (all labs ordered are  listed, but only abnormal results are displayed) Labs Reviewed  COMPREHENSIVE METABOLIC PANEL - Abnormal; Notable for the following components:      Result Value   CO2 19 (*)    Glucose, Bld 107 (*)    Calcium 10.5 (*)    Total Protein 9.0 (*)    Albumin 5.1 (*)    Anion gap 16 (*)    All other components within normal limits  CBC - Abnormal; Notable for the following components:   WBC 13.2 (*)    Hemoglobin 12.7 (*)    Platelets 485 (*)    All other components within normal limits  LIPASE, BLOOD  URINALYSIS, ROUTINE W REFLEX MICROSCOPIC   EKG ED ECG REPORT I, Merwyn Katos, the attending physician, personally viewed and interpreted this ECG. Date: 08/27/2022 EKG Time: 1505 Rate: 104 Rhythm: normal sinus rhythm QRS Axis: normal Intervals: normal ST/T Wave abnormalities: normal Narrative Interpretation: no evidence of acute ischemia PROCEDURES: Critical Care performed: No Procedures MEDICATIONS ORDERED IN ED: Medications  ondansetron (ZOFRAN-ODT) disintegrating tablet 4 mg (4 mg Oral Given 08/27/22 1506)  buprenorphine-naloxone (SUBOXONE) 8-2 mg per SL tablet 1 tablet (1 tablet Sublingual Given 08/27/22 1806)  IMPRESSION / MDM / ASSESSMENT AND PLAN / ED COURSE  I reviewed the triage vital signs and the nursing notes.                             The patient is on the cardiac monitor to evaluate for evidence of arrhythmia and/or significant heart rate changes. Patient's presentation is most consistent with acute presentation with potential threat to life or bodily function. + Restlessness + Abdominal cramping, Nausea Presentation most consistent with opioid withdrawal. Rx: Suboxone (Buprenorphine) sublingual tablet 8mg  Percocet taper Disposition: Discharge.   FINAL CLINICAL IMPRESSION(S) / ED DIAGNOSES   Final diagnoses:  Opioid withdrawal (HCC)  Nausea and vomiting, unspecified vomiting type   Rx / DC Orders   ED Discharge Orders     None      Note:   This document was prepared using Dragon voice recognition software and may include unintentional dictation errors.   Merwyn Katos, MD 08/27/22 [phone]

## 2022-08-27 NOTE — Discharge Instructions (Addendum)
Percocet taper Day 1 please take 1 tab every 12 hours.  You may use a half a tab as needed for any breakthrough symptoms Day 2 please take 1 tab in the morning and half a tab at night. Day 3 please take one half tab in the morning and one half tab at night Day 4 please take one half tab in the morning Day 5 please stop taking your Percocet

## 2022-08-27 NOTE — ED Notes (Signed)
Pt BP taken due to feeling like it was low.

## 2022-08-31 NOTE — Therapy (Incomplete)
OUTPATIENT PHYSICAL THERAPY LOWER EXTREMITY TREATMENT   Patient Name: Thomas Williamson MRN: [id number] DOB:1975/07/24, 47 y.o., male Today's Date: 09/01/2022  END OF SESSION:  PT End of Session - 09/01/22 1431     Visit Number 7    Number of Visits 25    Date for PT Re-Evaluation 10/06/22    PT Start Time 1431    PT Stop Time 1515    PT Time Calculation (min) 44 min    Equipment Utilized During Treatment Gait belt    Activity Tolerance Patient tolerated treatment well    Behavior During Therapy WFL for tasks assessed/performed              Past Medical History:  Diagnosis Date   Acetabulum fracture, left (HCC) 06/30/2022   Alcohol use disorder, severe, in early remission (HCC) 03/30/2018   Allergy    Anxiety    Bipolar disorder, in partial remission, most recent episode hypomanic (HCC) 03/30/2018   [facility] Dr[facility]   Bleeding internal hemorrhoids 09/12/2013   Cannabis dependence (HCC) 07/13/2018   Daily use   Cervical disc disease 11/20/2014   Chronic anxiety 07/13/2018   Chronic systolic (congestive) heart failure (HCC)    Complication of anesthesia    Depression    Dyslipidemia    GERD (gastroesophageal reflux disease)    Hernia, inguinal, bilateral 02/24/2011   High ankle sprain of right lower extremity 01/16/2015   History of hiatal hernia    Hx of hepatitis C 10/05/2017   -treated in 2019 -hepatology recommended no further surveillance needed except for LFTs with labs and see hepatologist if elevated   Hypertension    Lipids abnormal 09/29/2013   Pneumonia    PONV (postoperative nausea and vomiting)    Vitamin D deficiency 07/01/2022   Past Surgical History:  Procedure Laterality Date   ANTERIOR CRUCIATE LIGAMENT REPAIR  2010   BIOPSY  03/13/2019   Procedure: BIOPSY;  Surgeon: Benancio Deeds, MD;  Location: [facility];  Service: Gastroenterology;;   COLONOSCOPY N/A 03/13/2019   per Dr. Adela Lank, adenomatous polyps,  repeat in 3 yrs   INGUINAL HERNIA REPAIR  02/23/2012   Procedure: LAPAROSCOPIC BILATERAL INGUINAL HERNIA REPAIR;  Surgeon: Kandis Cocking, MD;  Location: [facility];  Service: General;  Laterality: Bilateral;  Laparoscopic Bilateral Inguinal Hernia Repair with mesh   INSERTION OF MESH  02/23/2012   Procedure: INSERTION OF MESH;  Surgeon: Kandis Cocking, MD;  Location: [facility];  Service: General;  Laterality: N/A;   NOSE SURGERY  [id number],7425   ORIF ACETABULAR FRACTURE Left 06/30/2022   Procedure: OPEN REDUCTION INTERNAL FIXATION (ORIF) ACETABULAR FRACTURE;  Surgeon: Myrene Galas, MD;  Location: [facility];  Service: Orthopedics;  Laterality: Left;   POLYPECTOMY  03/13/2019   Procedure: POLYPECTOMY;  Surgeon: Benancio Deeds, MD;  Location: [facility];  Service: Gastroenterology;;   Patient Active Problem List   Diagnosis Date Noted   Abnormal LFTs 07/08/2022   Left foot drop 07/01/2022   Vitamin D deficiency 07/01/2022   Acetabulum fracture, left (HCC) 06/30/2022   Alcohol dependence (HCC) 01/05/2022   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   Alcohol-induced acute pancreatitis without infection or necrosis 12/05/2021   Genetic testing 04/21/2019   Benign neoplasm of colon    Loose stools    History of colonic polyps 03/07/2019   Cardiac left ventricular ejection fraction 30-35 percent 03/07/2019   Alcoholic ketoacidosis 11/18/2018   Dyslipidemia 09/16/2018   Fever blister 09/16/2018  Cardiomyopathy, alcoholic (HCC) 09/16/2018   Left bundle branch block (LBBB) 09/16/2018   Cannabis dependence (HCC) 07/13/2018   Chronic anxiety 07/13/2018   Bipolar 1 disorder, depressed, full remission (HCC) 03/30/2018   Cervical disc disease 11/20/2014   Hypertension 02/24/2011    PCP: Nelwyn Salisbury, MD  REFERRING PROVIDER: Myrene Galas, MD  REFERRING DIAG: S72.009A (ICD-10-CM) - Hip fracture [facility])  THERAPY DIAG:  Muscle weakness (generalized)  Pain in left hip  Difficulty  in walking, not elsewhere classified  Rationale for Evaluation and Treatment: Rehabilitation  ONSET DATE: 06/30/22  SUBJECTIVE:   SUBJECTIVE STATEMENT: Pt arrives without crutches and has since purchased some high top shoes to help with support on the ankles.  PERTINENT HISTORY: Pt is a pleasant 47 y.o. male s/p L acetabular fracture repair and hip dislocation after fall walking dog down stairs. Pt with subsequent LLE foot drop still since operation on 06/30/22. Reports managing well at home, just wants to be able to learn how to asc/desc steps and return to independence. Currently unemployed but has a job lined up for when he recovers that will require regular standing/walking, helping people perform ADL's. Denies falls since being home. Worst pain 8/10 NPS in L hip at surgical site. Reports burning pain along deep peroneal nerve distribution starting at fibular head traveling along lateral aspect of lower leg to dorsal part of foot. Pt with hypersensitivity to light touch and tenderness at fibular head.   PAIN:  Are you having pain? Yes: NPRS scale: 2/10 Pain location: L ankle Pain description: burning in foot,  Aggravating factors: some LLE mobility but tolerable, touching L foot.  Relieving factors: Medication,    PRECAUTIONS: Other: TDWB with LLE, ( posterior hip precautions  )  WEIGHT BEARING RESTRICTIONS: Yes WBAT weaning to no crutches (08/21/22) , post hip precautions 12 weeks from 06/30/22  FALLS:  Has patient fallen in last 6 months? No  LIVING ENVIRONMENT: Lives with: lives with their family Lives in: House/apartment Stairs: Yes: Internal: flight steps; on right going up and External: 1 steps; none Has following equipment at home: Walker - 2 wheeled and bed side commode  OCCUPATION: unemployed before injury. 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 Reports needing to be able to be able to walk, help with group home ADL's.   PLOF: Independent  PATIENT  GOALS: Return to independent  NEXT MD VISIT: PCP, 07/20/22 and Dr. Carola Frost on 07/27/22  OBJECTIVE:   DIAGNOSTIC FINDINGS:  CLINICAL DATA:  Postoperative status.   EXAM: DG HIP (WITH OR WITHOUT PELVIS) 1V*L*   COMPARISON:  CT scan of same day.   FINDINGS: No dislocation is noted. There appears to be improved alignment involving the posterior acetabular fracture noted on prior CT scan. No other bony abnormality is noted.   IMPRESSION: No dislocation is noted. Probable improved alignment involving posterior acetabular fracture on the left.     Electronically Signed   By: Lupita Raider M.D.  PATIENT SURVEYS:  FOTO Deferred to next session  COGNITION: Overall cognitive status: Within functional limits for tasks assessed     SENSATION: Light touch: Impaired  L4-L5 on L foot (great toe, medial malleolus)    POSTURE: No Significant postural limitations  PALPATION: TTP along LLE fibular head along deep peroneal nerve to dorsum of foot  LOWER EXTREMITY ROM:  Active ROM Right eval Left eval  Hip flexion  90  Hip extension    Hip abduction  Hip adduction    Hip internal rotation    Hip external rotation 40 28  Knee flexion    Knee extension 0 0  Ankle dorsiflexion 10 Unable due to L foot drop  Ankle plantarflexion 55 42  Ankle inversion 35 20  Ankle eversion 20 unable   (Blank rows = not tested)   LLE PROM ankle DF/gastroc muscle length: 82 degrees  LOWER EXTREMITY MMT:  MMT Right eval Left eval  Hip flexion 5 5  Hip extension    Hip abduction 5 4  Hip adduction 5 5  Hip internal rotation    Hip external rotation    Knee flexion 5 5  Knee extension 5 5  Ankle dorsiflexion 5 0  Ankle plantarflexion 5 5  Ankle inversion 5 4  Ankle eversion 5 0   (Blank rows = not tested)  LOWER EXTREMITY SPECIAL TESTS:  N/A  FUNCTIONAL TESTS:  5 times sit to stand: 11.33 sec, needs BUE support, TDWB on LLE Timed up and go (TUG): 13.95 seconds with RW 10 meter  walk test: .6 m/s using RW and TDWB on LLE   GAIT: Distance walked: 10 meters Assistive device utilized: Walker - 2 wheeled Level of assistance: Modified independence Comments: LLE TTWB using step to pattern with RW.   TODAY'S TREATMENT: DATE: 09/01/22   Gait training:   Ambulation around the clinic x2 laps with regular high top shoes donned for increased ankle stability/support, no balance loss  Ambulation over 6 hurdles, reciprocal forward gait. x6 laps CGA progressing to Supervision    Manual therapy:  L great toe gentle joint distraction: 3x30 sec holds  L MTP/first ray AP grade 3/4 mobs 3x30 sec bouts, improve joint mobility/passive toe extension in terminal stance of gait cycle with reduced pain.  L great toe flexor stretch passively: 3x 30 seconds      There.ex:  Seated leg press 25#, x10 Seated leg press 45# 2x15 pt educated on not coming into full hip flexion to abide by posterior hip precautions  Seated calf raises on leg press machine, 45#, 2x15  6" step ups with SUE support: 3x12/LE   Asc/desc 4 steps with reciprocal pattern and single rail use. X4 bouts total SBA. Good stability and balance.     PATIENT EDUCATION:  Education details: Education on importance of L ankle mobility in case pt will need AFO in the future for L foot drop to prevent gastrocnemius contracture. Reviewed post hip precautions and TDWB precautions and importance to comply for tissue healing.  Person educated: Patient Education method: Medical illustrator Education comprehension: verbalized understanding, returned demonstration, and needs further education  HOME EXERCISE PROGRAM: Access Code: [email or web address] URL: [email or web address] Date: 08/05/2022 Prepared by: Ronnie Derby  Exercises - Prone Hip Extension  - 1 x daily - 3-4 x weekly - 3 sets - 8 reps - Clamshell  - 1 x daily - 3-4 x weekly - 3 sets - 8 reps - Prone Hamstring Curl with Anchored Resistance   - 1 x daily - 3-4 x weekly - 3 sets - 8 reps - Supine Active Straight Leg Raise  - 1 x daily - 3-4 x weekly - 3 sets - 8 reps - Supine Hamstring Stretch with Strap  - 1 x daily - 7 x weekly - 1 sets - 3 reps - 30 hold  Access Code: [email or web address] URL: [email or web address] Date: 07/22/2022 Prepared by: Ronnie Derby  Exercises - Prone Hip Extension  - 1 x daily - 3-4 x weekly -  3 sets - 8 reps - Clamshell  - 1 x daily - 3-4 x weekly - 3 sets - 8 reps - Prone Hamstring Curl with Anchored Resistance  - 1 x daily - 3-4 x weekly - 3 sets - 8 reps - Supine Active Straight Leg Raise  - 1 x daily - 3-4 x weekly - 3 sets - 8 reps    Ankle pumps seated, LLE heel raise due to L foot drop: x20/LE    Supine gastroc stretch LLE with belt: 3x30 sec    Quad set LLE: x15   Heel slides LLE: x15 maintaining post hip precautions    SAQ's LLE: x15   Hip abduction supine: x15   Seated    LAQ's: x15/LLE   Standing:    LLE hip flexion maintaining post hip precautions: x15   LLE hip abduction: x15   LLE hip extension: x15  ASSESSMENT:  CLINICAL IMPRESSION:   Pt is moving well without the use of his crutch and only had one episode of LOB which was at the conclusion of the session a he was leaving.  Pt otherwise moves well and is making significant improvements in the strength of the L LE.  Pt does support enjoying the NMES of the L ankle musculature, however was unsure if it made any improvements to his functional use of the ankle.  Pt may benefit from continued trial during future session.   Pt will continue to benefit from skilled therapy to address remaining deficits in order to improve overall QoL and return to PLOF.     OBJECTIVE IMPAIRMENTS: Abnormal gait, decreased activity tolerance, decreased balance, decreased mobility, difficulty walking, decreased ROM, decreased strength, impaired flexibility, impaired sensation, and pain.   ACTIVITY LIMITATIONS: carrying, lifting, bending,  sitting, standing, squatting, sleeping, stairs, bathing, toileting, dressing, locomotion level, and caring for others  PARTICIPATION LIMITATIONS: cleaning, interpersonal relationship, driving, shopping, community activity, occupation, and yard work  PERSONAL FACTORS: Age, Fitness, Past/current experiences, Profession, Time since onset of injury/illness/exacerbation, and 3+ comorbidities: Alcohol use disorder (in early remission), CHF, cardiomyopathy, depression, anxiety  are also affecting patient's functional outcome.   REHAB POTENTIAL: Fair LLE foot drop, visit limit cap of 30 visits, multiple WB and hip precautions  CLINICAL DECISION MAKING: Evolving/moderate complexity  EVALUATION COMPLEXITY: Moderate   GOALS: Goals reviewed with patient? No  SHORT TERM GOALS: Target date: 08/25/22 Pt will be independent with HEP to improve L hip AROM and LLE strength to return to normalized gait mechanics with LRAD. Baseline: 07/14/22: HEP provided/discussed; 08/25/22: Performing as prescribed. Goal status: MET  2.  Pt will be able to ambulate > 500' with LRAD once WBAT, to complete household and short community distances.  Baseline: 07/14/22: TDWB on LLE, using RW only completing household distances. Goal status: INITIAL  LONG TERM GOALS: Target date: 10/06/22  Pt will improve FOTO to target score to demonstrate clinically significant improvement in functional mobility. Baseline: 07/14/22: defer to next session Goal status: INITIAL  2.  Pt will improve TUG to < 12 sec with no AD to demo reduced falls risk and safe ability toc complete STS and stand to sit transfer without need for DME. Baseline: 07/14/22: LLE TDWB using RW at 13.95 seconds Goal status: INITIAL  3.  Pt will improve 5xSTS in < 12 seconds with LLE WBAT and no UE support to demo significant improvement in LE strength and reduced falls risk. Baseline: 07/14/22: 11.33 seconds maintaining LLE TDWB and BUE support on arm rests Goal status:  INITIAL  4.  Pt will improve 10 meter gait speed to >/= 1.2 m/s with LRAD to demo ability to ambulate at safe velocity to return to mod-I completion of community walking tasks.  Baseline: 07/14/22: LLE TDWB using RW completing at .6 m/s Goal status: INITIAL  5.  Pt will be able to asc/desc full flight of stairs with reciprocal pattern without rails to return to complete independence with accessing second story of parent's home to access his bedroom. Baseline: 07/14/22: unable to complete due to WB status Goal status: INITIAL  PLAN:  PT FREQUENCY: 1-2x/week  PT DURATION: 12 weeks  PLANNED INTERVENTIONS: Therapeutic exercises, Therapeutic activity, Neuromuscular re-education, Balance training, Gait training, Patient/Family education, Self Care, Joint mobilization, Stair training, Vestibular training, Canalith repositioning, Orthotic/Fit training, DME instructions, Dry Needling, Electrical stimulation, Spinal mobilization, Cryotherapy, Moist heat, scar mobilization, Manual therapy, and Re-evaluation  PLAN FOR NEXT SESSION: WBAT gait progression without crutches. L foot clearance activities. LLE strength/mobility at [facility] machine. CKC glut loading and quad strengthening within posterior hip precautions.   Nolon Bussing, PT, DPT Physical Therapist - [facility]  09/01/22, 4:24 PM

## 2022-09-01 ENCOUNTER — Ambulatory Visit: Payer: BC Managed Care – PPO

## 2022-09-01 DIAGNOSIS — M6281 Muscle weakness (generalized): Secondary | ICD-10-CM

## 2022-09-01 DIAGNOSIS — R262 Difficulty in walking, not elsewhere classified: Secondary | ICD-10-CM | POA: Diagnosis not present

## 2022-09-01 DIAGNOSIS — M25552 Pain in left hip: Secondary | ICD-10-CM

## 2022-09-02 NOTE — Therapy (Signed)
OUTPATIENT PHYSICAL THERAPY LOWER EXTREMITY TREATMENT   Patient Name: Thomas Williamson MRN: [id number] DOB:[birth year], 47 y.o., male Today's Date: 09/03/2022   END OF SESSION:  PT End of Session - 09/03/22 1429     Visit Number 8    Number of Visits 25    Date for PT Re-Evaluation 10/06/22    PT Start Time 1430    PT Stop Time 1508    PT Time Calculation (min) 38 min    Equipment Utilized During Treatment Gait belt    Activity Tolerance Patient tolerated treatment well    Behavior During Therapy WFL for tasks assessed/performed              Past Medical History:  Diagnosis Date   Acetabulum fracture, left (HCC) 06/30/2022   Alcohol use disorder, severe, in early remission (HCC) 03/30/2018   Allergy    Anxiety    Bipolar disorder, in partial remission, most recent episode hypomanic (HCC) 03/30/2018   [facility] Dr[facility]a [facility]   Bleeding internal hemorrhoids 09/12/2013   Cannabis dependence (HCC) 07/13/2018   Daily use   Cervical disc disease 11/20/2014   Chronic anxiety 07/13/2018   Chronic systolic (congestive) heart failure (HCC)    Complication of anesthesia    Depression    Dyslipidemia    GERD (gastroesophageal reflux disease)    Hernia, inguinal, bilateral 02/24/2011   High ankle sprain of right lower extremity 01/16/2015   History of hiatal hernia    Hx of hepatitis C 10/05/2017   -treated in 2019 -hepatology recommended no further surveillance needed except for LFTs with labs and see hepatologist if elevated   Hypertension    Lipids abnormal 09/29/2013   Pneumonia    PONV (postoperative nausea and vomiting)    Vitamin D deficiency 07/01/2022   Past Surgical History:  Procedure Laterality Date   ANTERIOR CRUCIATE LIGAMENT REPAIR  2010   BIOPSY  03/13/2019   Procedure: BIOPSY;  Surgeon: Benancio Deeds, MD;  Location: [facility];  Service: Gastroenterology;;   COLONOSCOPY N/A 03/13/2019   per Dr. Adela Lank, adenomatous polyps,  repeat in 3 yrs   INGUINAL HERNIA REPAIR  02/23/2012   Procedure: LAPAROSCOPIC BILATERAL INGUINAL HERNIA REPAIR;  Surgeon: Kandis Cocking, MD;  Location: [facility];  Service: General;  Laterality: Bilateral;  Laparoscopic Bilateral Inguinal Hernia Repair with mesh   INSERTION OF MESH  02/23/2012   Procedure: INSERTION OF MESH;  Surgeon: Kandis Cocking, MD;  Location: [facility];  Service: General;  Laterality: N/A;   NOSE SURGERY  [id number]   ORIF ACETABULAR FRACTURE Left 06/30/2022   Procedure: OPEN REDUCTION INTERNAL FIXATION (ORIF) ACETABULAR FRACTURE;  Surgeon: Myrene Galas, MD;  Location: [facility];  Service: Orthopedics;  Laterality: Left;   POLYPECTOMY  03/13/2019   Procedure: POLYPECTOMY;  Surgeon: Benancio Deeds, MD;  Location: [facility];  Service: Gastroenterology;;   Patient Active Problem List   Diagnosis Date Noted   Abnormal LFTs 07/08/2022   Left foot drop 07/01/2022   Vitamin D deficiency 07/01/2022   Acetabulum fracture, left (HCC) 06/30/2022   Alcohol dependence (HCC) 01/05/2022   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   Alcohol-induced acute pancreatitis without infection or necrosis 12/05/2021   Genetic testing 04/21/2019   Benign neoplasm of colon    Loose stools    History of colonic polyps 03/07/2019   Cardiac left ventricular ejection fraction 30-35 percent 03/07/2019   Alcoholic ketoacidosis 11/18/2018   Dyslipidemia 09/16/2018   Fever blister 09/16/2018  Cardiomyopathy, alcoholic (HCC) 09/16/2018   Left bundle branch block (LBBB) 09/16/2018   Cannabis dependence (HCC) 07/13/2018   Chronic anxiety 07/13/2018   Bipolar 1 disorder, depressed, full remission (HCC) 03/30/2018   Cervical disc disease 11/20/2014   Hypertension 02/24/2011    PCP: Nelwyn Salisbury, MD  REFERRING PROVIDER: Myrene Galas, MD  REFERRING DIAG: S72.009A (ICD-10-CM) - Hip fracture [facility])  THERAPY DIAG:  Muscle weakness (generalized)  Pain in left hip  Difficulty  in walking, not elsewhere classified  Rationale for Evaluation and Treatment: Rehabilitation  ONSET DATE: 06/30/22  SUBJECTIVE:   SUBJECTIVE STATEMENT:   Pt reports he is having some soreness due to going up/down the stairs at his parents house moving his clothes up to his room.   PERTINENT HISTORY: Pt is a pleasant 47 y.o. male s/p L acetabular fracture repair and hip dislocation after fall walking dog down stairs. Pt with subsequent LLE foot drop still since operation on 06/30/22. Reports managing well at home, just wants to be able to learn how to asc/desc steps and return to independence. Currently unemployed but has a job lined up for when he recovers that will require regular standing/walking, helping people perform ADL's. Denies falls since being home. Worst pain 8/10 NPS in L hip at surgical site. Reports burning pain along deep peroneal nerve distribution starting at fibular head traveling along lateral aspect of lower leg to dorsal part of foot. Pt with hypersensitivity to light touch and tenderness at fibular head.   PAIN:  Are you having pain? Yes: NPRS scale: 3/10 Pain location: L ankle/foot Pain description: burning in foot,  Aggravating factors: some LLE mobility but tolerable, touching L foot.  Relieving factors: Medication,    PRECAUTIONS: Other: TDWB with LLE, ( posterior hip precautions  )  WEIGHT BEARING RESTRICTIONS: Yes WBAT weaning to no crutches (08/21/22) , post hip precautions 12 weeks from 06/30/22  FALLS:  Has patient fallen in last 6 months? No  LIVING ENVIRONMENT: Lives with: lives with their family Lives in: House/apartment Stairs: Yes: Internal: flight steps; on right going up and External: 1 steps; none Has following equipment at home: Walker - 2 wheeled and bed side commode  OCCUPATION: unemployed before injury. 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 Reports needing to be able to be able to walk, help with group home ADL's.    PLOF: Independent  PATIENT GOALS: Return to independent  NEXT MD VISIT: PCP, 07/20/22 and Dr. Carola Frost on 07/27/22  OBJECTIVE:   DIAGNOSTIC FINDINGS:  CLINICAL DATA:  Postoperative status.   EXAM: DG HIP (WITH OR WITHOUT PELVIS) 1V*L*   COMPARISON:  CT scan of same day.   FINDINGS: No dislocation is noted. There appears to be improved alignment involving the posterior acetabular fracture noted on prior CT scan. No other bony abnormality is noted.   IMPRESSION: No dislocation is noted. Probable improved alignment involving posterior acetabular fracture on the left.     Electronically Signed   By: Lupita Raider M.D.  PATIENT SURVEYS:  FOTO Deferred to next session  COGNITION: Overall cognitive status: Within functional limits for tasks assessed     SENSATION: Light touch: Impaired  L4-L5 on L foot (great toe, medial malleolus)    POSTURE: No Significant postural limitations  PALPATION: TTP along LLE fibular head along deep peroneal nerve to dorsum of foot  LOWER EXTREMITY ROM:  Active ROM Right eval Left eval  Hip flexion  90  Hip extension  Hip abduction    Hip adduction    Hip internal rotation    Hip external rotation 40 28  Knee flexion    Knee extension 0 0  Ankle dorsiflexion 10 Unable due to L foot drop  Ankle plantarflexion 55 42  Ankle inversion 35 20  Ankle eversion 20 unable   (Blank rows = not tested)   LLE PROM ankle DF/gastroc muscle length: 82 degrees  LOWER EXTREMITY MMT:  MMT Right eval Left eval  Hip flexion 5 5  Hip extension    Hip abduction 5 4  Hip adduction 5 5  Hip internal rotation    Hip external rotation    Knee flexion 5 5  Knee extension 5 5  Ankle dorsiflexion 5 0  Ankle plantarflexion 5 5  Ankle inversion 5 4  Ankle eversion 5 0   (Blank rows = not tested)  LOWER EXTREMITY SPECIAL TESTS:  N/A  FUNCTIONAL TESTS:  5 times sit to stand: 11.33 sec, needs BUE support, TDWB on LLE Timed up and go (TUG):  13.95 seconds with RW 10 meter walk test: .6 m/s using RW and TDWB on LLE   GAIT: Distance walked: 10 meters Assistive device utilized: Walker - 2 wheeled Level of assistance: Modified independence Comments: LLE TTWB using step to pattern with RW.   TODAY'S TREATMENT: DATE: 09/03/22   Gait training:   Ambulation out of the clinic with increase hip flexion for foot clearance utilized Ambulation up/down ramp outside of building to assess ability to navigate incline, x2 laps each direction   Manual therapy:  L great toe gentle joint distraction: 3x30 sec holds  L MTP/first ray AP grade 3/4 mobs 3x30 sec bouts, improve joint mobility/passive toe extension in terminal stance of gait cycle with reduced pain.  L great toe flexor stretch passively: 3x 30 seconds  Supine talocrural AP mobilization, Grades III-IV for increased range of motion of the ankle joint Supine metatarsal fan mobilization, Grades III-IV for increased metatarsal mobility, 2x30 sec bouts at each segment Supine ankle distraction technique, 30 sec bout Supine intertarsal/tarsometatarsal  plantar glides, grades III-IV, for increased supination/pronation of the ankle Desensitization of the forefoot in order to improve overall pain levels at the ankle, 30 sec bouts x2   There.ex:  Supine dorsiflexion with tactile PNF tapping of the anterior tibialis for increased recruitment of the muscle, 2x10    PATIENT EDUCATION:  Education details: Education on importance of L ankle mobility in case pt will need AFO in the future for L foot drop to prevent gastrocnemius contracture. Reviewed post hip precautions and TDWB precautions and importance to comply for tissue healing.  Person educated: Patient Education method: Medical illustrator Education comprehension: verbalized understanding, returned demonstration, and needs further education  HOME EXERCISE PROGRAM: Access Code: [email or web address] URL:  [email or web address] Date: 08/05/2022 Prepared by: Ronnie Derby  Exercises - Prone Hip Extension  - 1 x daily - 3-4 x weekly - 3 sets - 8 reps - Clamshell  - 1 x daily - 3-4 x weekly - 3 sets - 8 reps - Prone Hamstring Curl with Anchored Resistance  - 1 x daily - 3-4 x weekly - 3 sets - 8 reps - Supine Active Straight Leg Raise  - 1 x daily - 3-4 x weekly - 3 sets - 8 reps - Supine Hamstring Stretch with Strap  - 1 x daily - 7 x weekly - 1 sets - 3 reps - 30 hold  Access Code: [email or web address] URL: [email or web address] Date:  07/22/2022 Prepared by: Ronnie Derby  Exercises - Prone Hip Extension  - 1 x daily - 3-4 x weekly - 3 sets - 8 reps - Clamshell  - 1 x daily - 3-4 x weekly - 3 sets - 8 reps - Prone Hamstring Curl with Anchored Resistance  - 1 x daily - 3-4 x weekly - 3 sets - 8 reps - Supine Active Straight Leg Raise  - 1 x daily - 3-4 x weekly - 3 sets - 8 reps    Ankle pumps seated, LLE heel raise due to L foot drop: x20/LE    Supine gastroc stretch LLE with belt: 3x30 sec    Quad set LLE: x15   Heel slides LLE: x15 maintaining post hip precautions    SAQ's LLE: x15   Hip abduction supine: x15   Seated    LAQ's: x15/LLE   Standing:    LLE hip flexion maintaining post hip precautions: x15   LLE hip abduction: x15   LLE hip extension: x15  ASSESSMENT:  CLINICAL IMPRESSION:   Pt responded well to the exercises and noted to be feeling some relief with the manual therapy approach to therapy.  Pt also educated on desensitization of the foot/ankle region in order for the painful symptoms he has to be relieved.  Pt understood and noted following the treatment, he was having reduced tingling and discomfort in the foot.  Pt will continue to benefit doing this technique at home as part of HEP.   Pt will continue to benefit from skilled therapy to address remaining deficits in order to improve overall QoL and return to PLOF.       OBJECTIVE  IMPAIRMENTS: Abnormal gait, decreased activity tolerance, decreased balance, decreased mobility, difficulty walking, decreased ROM, decreased strength, impaired flexibility, impaired sensation, and pain.   ACTIVITY LIMITATIONS: carrying, lifting, bending, sitting, standing, squatting, sleeping, stairs, bathing, toileting, dressing, locomotion level, and caring for others  PARTICIPATION LIMITATIONS: cleaning, interpersonal relationship, driving, shopping, community activity, occupation, and yard work  PERSONAL FACTORS: Age, Fitness, Past/current experiences, Profession, Time since onset of injury/illness/exacerbation, and 3+ comorbidities: Alcohol use disorder (in early remission), CHF, cardiomyopathy, depression, anxiety  are also affecting patient's functional outcome.   REHAB POTENTIAL: Fair LLE foot drop, visit limit cap of 30 visits, multiple WB and hip precautions  CLINICAL DECISION MAKING: Evolving/moderate complexity  EVALUATION COMPLEXITY: Moderate   GOALS: Goals reviewed with patient? No  SHORT TERM GOALS: Target date: 08/25/22 Pt will be independent with HEP to improve L hip AROM and LLE strength to return to normalized gait mechanics with LRAD. Baseline: 07/14/22: HEP provided/discussed; 08/25/22: Performing as prescribed. Goal status: MET  2.  Pt will be able to ambulate > 500' with LRAD once WBAT, to complete household and short community distances.  Baseline: 07/14/22: TDWB on LLE, using RW only completing household distances. Goal status: INITIAL  LONG TERM GOALS: Target date: 10/06/22  Pt will improve FOTO to target score to demonstrate clinically significant improvement in functional mobility. Baseline: 07/14/22: defer to next session Goal status: INITIAL  2.  Pt will improve TUG to < 12 sec with no AD to demo reduced falls risk and safe ability toc complete STS and stand to sit transfer without need for DME. Baseline: 07/14/22: LLE TDWB using RW at 13.95 seconds Goal  status: INITIAL  3.  Pt will improve 5xSTS in < 12 seconds with LLE WBAT and no UE support to demo significant improvement in LE strength and reduced falls risk.  Baseline: 07/14/22: 11.33 seconds maintaining LLE TDWB and BUE support on arm rests Goal status: INITIAL  4.  Pt will improve 10 meter gait speed to >/= 1.2 m/s with LRAD to demo ability to ambulate at safe velocity to return to mod-I completion of community walking tasks.  Baseline: 07/14/22: LLE TDWB using RW completing at .6 m/s Goal status: INITIAL  5.  Pt will be able to asc/desc full flight of stairs with reciprocal pattern without rails to return to complete independence with accessing second story of parent's home to access his bedroom. Baseline: 07/14/22: unable to complete due to WB status Goal status: INITIAL  PLAN:  PT FREQUENCY: 1-2x/week  PT DURATION: 12 weeks  PLANNED INTERVENTIONS: Therapeutic exercises, Therapeutic activity, Neuromuscular re-education, Balance training, Gait training, Patient/Family education, Self Care, Joint mobilization, Stair training, Vestibular training, Canalith repositioning, Orthotic/Fit training, DME instructions, Dry Needling, Electrical stimulation, Spinal mobilization, Cryotherapy, Moist heat, scar mobilization, Manual therapy, and Re-evaluation  PLAN FOR NEXT SESSION:  WBAT gait progression without crutches. L foot clearance activities. LLE strength/mobility at [facility] machine. CKC glut loading and quad strengthening within posterior hip precautions.   Nolon Bussing, PT, DPT Physical Therapist - [facility]  09/03/22, 3:38 PM

## 2022-09-03 ENCOUNTER — Ambulatory Visit: Payer: BC Managed Care – PPO | Attending: Orthopedic Surgery

## 2022-09-03 DIAGNOSIS — R262 Difficulty in walking, not elsewhere classified: Secondary | ICD-10-CM | POA: Insufficient documentation

## 2022-09-03 DIAGNOSIS — M25552 Pain in left hip: Secondary | ICD-10-CM | POA: Insufficient documentation

## 2022-09-03 DIAGNOSIS — M6281 Muscle weakness (generalized): Secondary | ICD-10-CM | POA: Diagnosis not present

## 2022-09-08 DIAGNOSIS — M21372 Foot drop, left foot: Secondary | ICD-10-CM | POA: Diagnosis not present

## 2022-09-09 ENCOUNTER — Ambulatory Visit: Payer: BC Managed Care – PPO

## 2022-09-09 DIAGNOSIS — R262 Difficulty in walking, not elsewhere classified: Secondary | ICD-10-CM

## 2022-09-09 DIAGNOSIS — M25552 Pain in left hip: Secondary | ICD-10-CM

## 2022-09-09 DIAGNOSIS — M6281 Muscle weakness (generalized): Secondary | ICD-10-CM

## 2022-09-09 NOTE — Therapy (Addendum)
OUTPATIENT PHYSICAL THERAPY LOWER EXTREMITY TREATMENT   Patient Name: Thomas Williamson MRN: [id number] DOB:[birth year], 47 y.o., male Today's Date: 09/09/2022   END OF SESSION:  PT End of Session - 09/09/22 1033     Visit Number 9    Number of Visits 25    Date for PT Re-Evaluation 10/06/22    PT Start Time 1030    PT Stop Time 1112    PT Time Calculation (min) 42 min    Equipment Utilized During Treatment Gait belt    Activity Tolerance Patient tolerated treatment well    Behavior During Therapy WFL for tasks assessed/performed              Past Medical History:  Diagnosis Date   Acetabulum fracture, left (HCC) 06/30/2022   Alcohol use disorder, severe, in early remission (HCC) 03/30/2018   Allergy    Anxiety    Bipolar disorder, in partial remission, most recent episode hypomanic (HCC) 03/30/2018   [facility] Dr. [facility]a [facility]   Bleeding internal hemorrhoids 09/12/2013   Cannabis dependence (HCC) 07/13/2018   Daily use   Cervical disc disease 11/20/2014   Chronic anxiety 07/13/2018   Chronic systolic (congestive) heart failure (HCC)    Complication of anesthesia    Depression    Dyslipidemia    GERD (gastroesophageal reflux disease)    Hernia, inguinal, bilateral 02/24/2011   High ankle sprain of right lower extremity 01/16/2015   History of hiatal hernia    Hx of hepatitis C 10/05/2017   -treated in 2019 -hepatology recommended no further surveillance needed except for LFTs with labs and see hepatologist if elevated   Hypertension    Lipids abnormal 09/29/2013   Pneumonia    PONV (postoperative nausea and vomiting)    Vitamin D deficiency 07/01/2022   Past Surgical History:  Procedure Laterality Date   ANTERIOR CRUCIATE LIGAMENT REPAIR  2010   BIOPSY  03/13/2019   Procedure: BIOPSY;  Surgeon: Benancio Deeds, MD;  Location: [facility];  Service: Gastroenterology;;   COLONOSCOPY N/A 03/13/2019   per Dr. Adela Lank, adenomatous polyps,  repeat in 3 yrs   INGUINAL HERNIA REPAIR  02/23/2012   Procedure: LAPAROSCOPIC BILATERAL INGUINAL HERNIA REPAIR;  Surgeon: Kandis Cocking, MD;  Location: [facility];  Service: General;  Laterality: Bilateral;  Laparoscopic Bilateral Inguinal Hernia Repair with mesh   INSERTION OF MESH  02/23/2012   Procedure: INSERTION OF MESH;  Surgeon: Kandis Cocking, MD;  Location: [facility];  Service: General;  Laterality: N/A;   NOSE SURGERY  [id number]   ORIF ACETABULAR FRACTURE Left 06/30/2022   Procedure: OPEN REDUCTION INTERNAL FIXATION (ORIF) ACETABULAR FRACTURE;  Surgeon: Myrene Galas, MD;  Location: [facility];  Service: Orthopedics;  Laterality: Left;   POLYPECTOMY  03/13/2019   Procedure: POLYPECTOMY;  Surgeon: Benancio Deeds, MD;  Location: [facility];  Service: Gastroenterology;;   Patient Active Problem List   Diagnosis Date Noted   Abnormal LFTs 07/08/2022   Left foot drop 07/01/2022   Vitamin D deficiency 07/01/2022   Acetabulum fracture, left (HCC) 06/30/2022   Alcohol dependence (HCC) 01/05/2022   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   Alcohol-induced acute pancreatitis without infection or necrosis 12/05/2021   Genetic testing 04/21/2019   Benign neoplasm of colon    Loose stools    History of colonic polyps 03/07/2019   Cardiac left ventricular ejection fraction 30-35 percent 03/07/2019   Alcoholic ketoacidosis 11/18/2018   Dyslipidemia 09/16/2018   Fever blister 09/16/2018  Cardiomyopathy, alcoholic (HCC) 09/16/2018   Left bundle branch block (LBBB) 09/16/2018   Cannabis dependence (HCC) 07/13/2018   Chronic anxiety 07/13/2018   Bipolar 1 disorder, depressed, full remission (HCC) 03/30/2018   Cervical disc disease 11/20/2014   Hypertension 02/24/2011    PCP: Nelwyn Salisbury, MD  REFERRING PROVIDER: Myrene Galas, MD  REFERRING DIAG: S72.009A (ICD-10-CM) - Hip fracture [facility])  THERAPY DIAG:  Muscle weakness (generalized)  Pain in left hip  Difficulty  in walking, not elsewhere classified  Rationale for Evaluation and Treatment: Rehabilitation  ONSET DATE: 06/30/22  SUBJECTIVE:   SUBJECTIVE STATEMENT:  Pt presents wearing L AFO. Reports that he has not had many problems with the AFO yet, however mild discomfort at medial arch from brace. Pt states he continues to have nerve related pain especially at night through the L foot.    PERTINENT HISTORY: Pt is a pleasant 47 y.o. male s/p L acetabular fracture repair and hip dislocation after fall walking dog down stairs. Pt with subsequent LLE foot drop still since operation on 06/30/22. Reports managing well at home, just wants to be able to learn how to asc/desc steps and return to independence. Currently unemployed but has a job lined up for when he recovers that will require regular standing/walking, helping people perform ADL's. Denies falls since being home. Worst pain 8/10 NPS in L hip at surgical site. Reports burning pain along deep peroneal nerve distribution starting at fibular head traveling along lateral aspect of lower leg to dorsal part of foot. Pt with hypersensitivity to light touch and tenderness at fibular head.   PAIN:  Are you having pain? Yes: NPRS scale: 3/10 Pain location: L ankle/foot Pain description: burning in foot,  Aggravating factors: some LLE mobility but tolerable, touching L foot.  Relieving factors: Medication,    PRECAUTIONS: Other: TDWB with LLE, ( posterior hip precautions  )  WEIGHT BEARING RESTRICTIONS: Yes WBAT weaning to no crutches (08/21/22) , post hip precautions 12 weeks from 06/30/22  FALLS:  Has patient fallen in last 6 months? No  LIVING ENVIRONMENT: Lives with: lives with their family Lives in: House/apartment Stairs: Yes: Internal: flight steps; on right going up and External: 1 steps; none Has following equipment at home: Walker - 2 wheeled and bed side commode  OCCUPATION: unemployed before injury. 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 Reports needing to be able to be able to walk, help with group home ADL's.   PLOF: Independent  PATIENT GOALS: Return to independent  NEXT MD VISIT: PCP, 07/20/22 and Dr. Carola Frost on 07/27/22  OBJECTIVE:   DIAGNOSTIC FINDINGS:  CLINICAL DATA:  Postoperative status.   EXAM: DG HIP (WITH OR WITHOUT PELVIS) 1V*L*   COMPARISON:  CT scan of same day.   FINDINGS: No dislocation is noted. There appears to be improved alignment involving the posterior acetabular fracture noted on prior CT scan. No other bony abnormality is noted.   IMPRESSION: No dislocation is noted. Probable improved alignment involving posterior acetabular fracture on the left.     Electronically Signed   By: Lupita Raider M.D.  PATIENT SURVEYS:  FOTO Deferred to next session  COGNITION: Overall cognitive status: Within functional limits for tasks assessed     SENSATION: Light touch: Impaired  L4-L5 on L foot (great toe, medial malleolus)    POSTURE: No Significant postural limitations  PALPATION: TTP along LLE fibular head along deep peroneal nerve to dorsum of foot  LOWER  EXTREMITY ROM:  Active ROM Right eval Left eval  Hip flexion  90  Hip extension    Hip abduction    Hip adduction    Hip internal rotation    Hip external rotation 40 28  Knee flexion    Knee extension 0 0  Ankle dorsiflexion 10 Unable due to L foot drop  Ankle plantarflexion 55 42  Ankle inversion 35 20  Ankle eversion 20 unable   (Blank rows = not tested)   LLE PROM ankle DF/gastroc muscle length: 82 degrees  LOWER EXTREMITY MMT:  MMT Right eval Left eval  Hip flexion 5 5  Hip extension    Hip abduction 5 4  Hip adduction 5 5  Hip internal rotation    Hip external rotation    Knee flexion 5 5  Knee extension 5 5  Ankle dorsiflexion 5 0  Ankle plantarflexion 5 5  Ankle inversion 5 4  Ankle eversion 5 0   (Blank rows = not tested)  LOWER EXTREMITY SPECIAL TESTS:   N/A  FUNCTIONAL TESTS:  5 times sit to stand: 11.33 sec, needs BUE support, TDWB on LLE Timed up and go (TUG): 13.95 seconds with RW 10 meter walk test: .6 m/s using RW and TDWB on LLE   GAIT: Distance walked: 10 meters Assistive device utilized: Walker - 2 wheeled Level of assistance: Modified independence Comments: LLE TTWB using step to pattern with RW.   TODAY'S TREATMENT: DATE: 09/09/22  Neuro Re-Ed:   Thomas Williamson PT Assessment - 09/09/22 1055       Functional Gait  Assessment   Gait assessed  Yes    Gait Level Surface Walks 20 ft in less than 7 sec but greater than 5.5 sec, uses assistive device, slower speed, mild gait deviations, or deviates 6-10 in outside of the 12 in walkway width.    Change in Gait Speed Able to change speed, demonstrates mild gait deviations, deviates 6-10 in outside of the 12 in walkway width, or no gait deviations, unable to achieve a major change in velocity, or uses a change in velocity, or uses an assistive device.    Gait with Horizontal Head Turns Performs head turns smoothly with slight change in gait velocity (eg, minor disruption to smooth gait path), deviates 6-10 in outside 12 in walkway width, or uses an assistive device.    Gait with Vertical Head Turns Performs head turns with no change in gait. Deviates no more than 6 in outside 12 in walkway width.    Gait and Pivot Turn Pivot turns safely within 3 sec and stops quickly with no loss of balance.    Step Over Obstacle Is able to step over 2 stacked shoe boxes taped together (9 in total height) without changing gait speed. No evidence of imbalance.    Gait with Narrow Base of Support Ambulates 4-7 steps.    Gait with Eyes Closed Walks 20 ft, uses assistive device, slower speed, mild gait deviations, deviates 6-10 in outside 12 in walkway width. Ambulates 20 ft in less than 9 sec but greater than 7 sec.    Ambulating Backwards Walks 20 ft, uses assistive device, slower speed, mild gait deviations,  deviates 6-10 in outside 12 in walkway width.    Steps Alternating feet, no rail.    Total Score 23             Pt able to perform reciprocal step through pattern with stairs without rail use. X4 steps.  Therapeutic Exercises: Mini squats with TRX straps 2 x12, verbal cueing needed to mind post hip precautions.   Lunges (L LE forward, R LE back 2/2 L AFO) at TRX straps. Vc's / Tc's needed for proper form, some carryover noted after cueing. 3 x6   There Act:   Reviewed how to doff and don L AFO without breaking posterior hip precautions.     PATIENT EDUCATION:  Education details: Education on importance of L ankle mobility in case pt will need AFO in the future for L foot drop to prevent gastrocnemius contracture. Reviewed post hip precautions and TDWB precautions and importance to comply for tissue healing.  Person educated: Patient Education method: Medical illustrator Education comprehension: verbalized understanding, returned demonstration, and needs further education  HOME EXERCISE PROGRAM: Access Code: [email or web address] URL: [email or web address] Date: 08/05/2022 Prepared by: Ronnie Derby  Exercises - Prone Hip Extension  - 1 x daily - 3-4 x weekly - 3 sets - 8 reps - Clamshell  - 1 x daily - 3-4 x weekly - 3 sets - 8 reps - Prone Hamstring Curl with Anchored Resistance  - 1 x daily - 3-4 x weekly - 3 sets - 8 reps - Supine Active Straight Leg Raise  - 1 x daily - 3-4 x weekly - 3 sets - 8 reps - Supine Hamstring Stretch with Strap  - 1 x daily - 7 x weekly - 1 sets - 3 reps - 30 hold  Access Code: [email or web address] URL: [email or web address] Date: 07/22/2022 Prepared by: Ronnie Derby  Exercises - Prone Hip Extension  - 1 x daily - 3-4 x weekly - 3 sets - 8 reps - Clamshell  - 1 x daily - 3-4 x weekly - 3 sets - 8 reps - Prone Hamstring Curl with Anchored Resistance  - 1 x daily - 3-4 x weekly - 3 sets - 8 reps - Supine Active Straight  Leg Raise  - 1 x daily - 3-4 x weekly - 3 sets - 8 reps    Ankle pumps seated, LLE heel raise due to L foot drop: x20/LE    Supine gastroc stretch LLE with belt: 3x30 sec    Quad set LLE: x15   Heel slides LLE: x15 maintaining post hip precautions    SAQ's LLE: x15   Hip abduction supine: x15   Seated    LAQ's: x15/LLE   Standing:    LLE hip flexion maintaining post hip precautions: x15   LLE hip abduction: x15   LLE hip extension: x15  ASSESSMENT:  CLINICAL IMPRESSION: Session focused on demonstrating L AFO donning/ doffing, completing the FGA, and functional strength training of bilat LE's. Pt notes knowledge and proper donning and doffing techniques of AFO, however requires multiple VC's to maintain post hip precautions during this process. Pt's FGA score of a 23/30 w/ usage of L AFO notes decreased fall risk, however balance deficits are present but not severely heightened compared to pt's PLOF. Pt would continue to benefit from skilled PT interventions to address his pain, strength and balance deficits to improve QoL and return to PLOF.   OBJECTIVE IMPAIRMENTS: Abnormal gait, decreased activity tolerance, decreased balance, decreased mobility, difficulty walking, decreased ROM, decreased strength, impaired flexibility, impaired sensation, and pain.   ACTIVITY LIMITATIONS: carrying, lifting, bending, sitting, standing, squatting, sleeping, stairs, bathing, toileting, dressing, locomotion level, and caring for others  PARTICIPATION LIMITATIONS: cleaning, interpersonal relationship, driving, shopping, community activity, occupation, and yard work  PERSONAL FACTORS: Age, Fitness, Past/current experiences, Profession,  Time since onset of injury/illness/exacerbation, and 3+ comorbidities: Alcohol use disorder (in early remission), CHF, cardiomyopathy, depression, anxiety  are also affecting patient's functional outcome.   REHAB POTENTIAL: Fair LLE foot drop, visit limit cap of 30  visits, multiple WB and hip precautions  CLINICAL DECISION MAKING: Evolving/moderate complexity  EVALUATION COMPLEXITY: Moderate   GOALS: Goals reviewed with patient? No  SHORT TERM GOALS: Target date: 08/25/22 Pt will be independent with HEP to improve L hip AROM and LLE strength to return to normalized gait mechanics with LRAD. Baseline: 07/14/22: HEP provided/discussed; 08/25/22: Performing as prescribed. Goal status: MET  2.  Pt will be able to ambulate > 500' with LRAD once WBAT, to complete household and short community distances.  Baseline: 07/14/22: TDWB on LLE, using RW only completing household distances. Goal status: INITIAL  LONG TERM GOALS: Target date: 10/06/22  Pt will improve FOTO to target score to demonstrate clinically significant improvement in functional mobility. Baseline: 07/14/22: defer to next session Goal status: INITIAL  2.  Pt will improve TUG to < 12 sec with no AD to demo reduced falls risk and safe ability toc complete STS and stand to sit transfer without need for DME. Baseline: 07/14/22: LLE TDWB using RW at 13.95 seconds Goal status: INITIAL  3.  Pt will improve 5xSTS in < 12 seconds with LLE WBAT and no UE support to demo significant improvement in LE strength and reduced falls risk. Baseline: 07/14/22: 11.33 seconds maintaining LLE TDWB and BUE support on arm rests Goal status: INITIAL  4.  Pt will improve 10 meter gait speed to >/= 1.2 m/s with LRAD to demo ability to ambulate at safe velocity to return to mod-I completion of community walking tasks.  Baseline: 07/14/22: LLE TDWB using RW completing at .6 m/s Goal status: INITIAL  5.  Pt will be able to asc/desc full flight of stairs with reciprocal pattern without rails to return to complete independence with accessing second story of parent's home to access his bedroom. Baseline: 07/14/22: unable to complete due to WB status Goal status: INITIAL  PLAN:  PT FREQUENCY: 1-2x/week  PT DURATION: 12  weeks  PLANNED INTERVENTIONS: Therapeutic exercises, Therapeutic activity, Neuromuscular re-education, Balance training, Gait training, Patient/Family education, Self Care, Joint mobilization, Stair training, Vestibular training, Canalith repositioning, Orthotic/Fit training, DME instructions, Dry Needling, Electrical stimulation, Spinal mobilization, Cryotherapy, Moist heat, scar mobilization, Manual therapy, and Re-evaluation  PLAN FOR NEXT SESSION: LLE strength/mobility and balance activities. CKC glut loading and quad strengthening within posterior hip precautions.   Lovie Macadamia, SPT  Delphia Grates. Fairly IV, PT, DPT Physical Therapist- [place]  [facility]

## 2022-09-11 ENCOUNTER — Ambulatory Visit: Payer: BC Managed Care – PPO

## 2022-09-11 ENCOUNTER — Other Ambulatory Visit: Payer: Self-pay | Admitting: Family Medicine

## 2022-09-11 DIAGNOSIS — I10 Essential (primary) hypertension: Secondary | ICD-10-CM

## 2022-09-14 ENCOUNTER — Ambulatory Visit: Payer: BC Managed Care – PPO

## 2022-09-16 ENCOUNTER — Ambulatory Visit: Payer: BC Managed Care – PPO

## 2022-09-16 DIAGNOSIS — F331 Major depressive disorder, recurrent, moderate: Secondary | ICD-10-CM | POA: Diagnosis not present

## 2022-09-16 DIAGNOSIS — S73015D Posterior dislocation of left hip, subsequent encounter: Secondary | ICD-10-CM | POA: Diagnosis not present

## 2022-09-16 DIAGNOSIS — S32462D Displaced associated transverse-posterior fracture of left acetabulum, subsequent encounter for fracture with routine healing: Secondary | ICD-10-CM | POA: Diagnosis not present

## 2022-09-17 ENCOUNTER — Ambulatory Visit: Payer: BC Managed Care – PPO

## 2022-09-17 DIAGNOSIS — M6281 Muscle weakness (generalized): Secondary | ICD-10-CM | POA: Diagnosis not present

## 2022-09-17 DIAGNOSIS — R262 Difficulty in walking, not elsewhere classified: Secondary | ICD-10-CM

## 2022-09-17 DIAGNOSIS — M25552 Pain in left hip: Secondary | ICD-10-CM

## 2022-09-17 NOTE — Therapy (Addendum)
OUTPATIENT PHYSICAL THERAPY LOWER EXTREMITY TREATMENT/ PROGRESS NOTE Dates of Reporting Period: 07/14/22 - 09/17/22   Patient Name: Thomas Williamson MRN: [id number] DOB:[birth year], 47 y.o., male Today's Date: 09/18/2022   END OF SESSION:  PT End of Session - 09/17/22 1514     Visit Number 10    Number of Visits 25    Date for PT Re-Evaluation 10/06/22    PT Start Time 1514    PT Stop Time 1555    PT Time Calculation (min) 41 min    Equipment Utilized During Treatment Gait belt    Activity Tolerance Patient tolerated treatment well    Behavior During Therapy WFL for tasks assessed/performed              Past Medical History:  Diagnosis Date   Acetabulum fracture, left (HCC) 06/30/2022   Alcohol use disorder, severe, in early remission (HCC) 03/30/2018   Allergy    Anxiety    Bipolar disorder, in partial remission, most recent episode hypomanic (HCC) 03/30/2018   [facility] Dr[facility]   Bleeding internal hemorrhoids 09/12/2013   Cannabis dependence (HCC) 07/13/2018   Daily use   Cervical disc disease 11/20/2014   Chronic anxiety 07/13/2018   Chronic systolic (congestive) heart failure (HCC)    Complication of anesthesia    Depression    Dyslipidemia    GERD (gastroesophageal reflux disease)    Hernia, inguinal, bilateral 02/24/2011   High ankle sprain of right lower extremity 01/16/2015   History of hiatal hernia    Hx of hepatitis C 10/05/2017   -treated in 2019 -hepatology recommended no further surveillance needed except for LFTs with labs and see hepatologist if elevated   Hypertension    Lipids abnormal 09/29/2013   Pneumonia    PONV (postoperative nausea and vomiting)    Vitamin D deficiency 07/01/2022   Past Surgical History:  Procedure Laterality Date   ANTERIOR CRUCIATE LIGAMENT REPAIR  2010   BIOPSY  03/13/2019   Procedure: BIOPSY;  Surgeon: Benancio Deeds, MD;  Location: [facility];  Service: Gastroenterology;;    COLONOSCOPY N/A 03/13/2019   per Dr. Adela Lank, adenomatous polyps, repeat in 3 yrs   INGUINAL HERNIA REPAIR  02/23/2012   Procedure: LAPAROSCOPIC BILATERAL INGUINAL HERNIA REPAIR;  Surgeon: Kandis Cocking, MD;  Location: [facility];  Service: General;  Laterality: Bilateral;  Laparoscopic Bilateral Inguinal Hernia Repair with mesh   INSERTION OF MESH  02/23/2012   Procedure: INSERTION OF MESH;  Surgeon: Kandis Cocking, MD;  Location: [facility];  Service: General;  Laterality: N/A;   NOSE SURGERY  [id number]   ORIF ACETABULAR FRACTURE Left 06/30/2022   Procedure: OPEN REDUCTION INTERNAL FIXATION (ORIF) ACETABULAR FRACTURE;  Surgeon: Myrene Galas, MD;  Location: [facility];  Service: Orthopedics;  Laterality: Left;   POLYPECTOMY  03/13/2019   Procedure: POLYPECTOMY;  Surgeon: Benancio Deeds, MD;  Location: [facility];  Service: Gastroenterology;;   Patient Active Problem List   Diagnosis Date Noted   Abnormal LFTs 07/08/2022   Left foot drop 07/01/2022   Vitamin D deficiency 07/01/2022   Acetabulum fracture, left (HCC) 06/30/2022   Alcohol dependence (HCC) 01/05/2022   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   Alcohol-induced acute pancreatitis without infection or necrosis 12/05/2021   Genetic testing 04/21/2019   Benign neoplasm of colon    Loose stools    History of colonic polyps 03/07/2019   Cardiac left ventricular ejection fraction 30-35 percent 03/07/2019   Alcoholic ketoacidosis 11/18/2018  Dyslipidemia 09/16/2018   Fever blister 09/16/2018   Cardiomyopathy, alcoholic (HCC) 09/16/2018   Left bundle branch block (LBBB) 09/16/2018   Cannabis dependence (HCC) 07/13/2018   Chronic anxiety 07/13/2018   Bipolar 1 disorder, depressed, full remission (HCC) 03/30/2018   Cervical disc disease 11/20/2014   Hypertension 02/24/2011    PCP: Nelwyn Salisbury, MD  REFERRING PROVIDER: Myrene Galas, MD  REFERRING DIAG: S72.009A (ICD-10-CM) - Hip fracture [facility])  THERAPY  DIAG:  Muscle weakness (generalized)  Pain in left hip  Difficulty in walking, not elsewhere classified  Rationale for Evaluation and Treatment: Rehabilitation  ONSET DATE: 06/30/22  SUBJECTIVE:   SUBJECTIVE STATEMENT:  Pt reports 0/10 NPS at L hip. He reports that he went to his orthopedic surgeon f/u appt yesterday and has been cleared of all posterior hip precautions. Pt reports no updates in the L foot, and states he has become use to the orthotic now.  PERTINENT HISTORY: Pt is a pleasant 47 y.o. male s/p L acetabular fracture repair and hip dislocation after fall walking dog down stairs. Pt with subsequent LLE foot drop still since operation on 06/30/22. Reports managing well at home, just wants to be able to learn how to asc/desc steps and return to independence. Currently unemployed but has a job lined up for when he recovers that will require regular standing/walking, helping people perform ADL's. Denies falls since being home. Worst pain 8/10 NPS in L hip at surgical site. Reports burning pain along deep peroneal nerve distribution starting at fibular head traveling along lateral aspect of lower leg to dorsal part of foot. Pt with hypersensitivity to light touch and tenderness at fibular head.   PAIN:  Are you having pain? Yes: NPRS scale: 0/10 Pain location: L ankle/foot Pain description: burning in foot,  Aggravating factors: some LLE mobility but tolerable, touching L foot.  Relieving factors: Medication,    PRECAUTIONS: None, posterior hip precautions cleared by MD 09/17/22  WEIGHT BEARING RESTRICTIONS: Yes WBAT weaning to no crutches (08/21/22) , post hip precautions 12 weeks from 06/30/22  FALLS:  Has patient fallen in last 6 months? No  LIVING ENVIRONMENT: Lives with: lives with their family Lives in: House/apartment Stairs: Yes: Internal: flight steps; on right going up and External: 1 steps; none Has following equipment at home: Walker - 2 wheeled and bed side  commode  OCCUPATION: unemployed before injury. 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 Reports needing to be able to be able to walk, help with group home ADL's.   PLOF: Independent  PATIENT GOALS: Return to independent  NEXT MD VISIT: PCP, 07/20/22 and Dr. Carola Frost on 07/27/22  OBJECTIVE:   DIAGNOSTIC FINDINGS:  CLINICAL DATA:  Postoperative status.   EXAM: DG HIP (WITH OR WITHOUT PELVIS) 1V*L*   COMPARISON:  CT scan of same day.   FINDINGS: No dislocation is noted. There appears to be improved alignment involving the posterior acetabular fracture noted on prior CT scan. No other bony abnormality is noted.   IMPRESSION: No dislocation is noted. Probable improved alignment involving posterior acetabular fracture on the left.     Electronically Signed   By: Lupita Raider M.D.  PATIENT SURVEYS:  FOTO Deferred to next session  COGNITION: Overall cognitive status: Within functional limits for tasks assessed     SENSATION: Light touch: Impaired  L4-L5 on L foot (great toe, medial malleolus)    POSTURE: No Significant postural limitations  PALPATION: TTP along LLE fibular head  along deep peroneal nerve to dorsum of foot  LOWER EXTREMITY ROM:  Active ROM Right eval Left eval  Hip flexion  90  Hip extension    Hip abduction    Hip adduction    Hip internal rotation    Hip external rotation 40 28  Knee flexion    Knee extension 0 0  Ankle dorsiflexion 10 Unable due to L foot drop  Ankle plantarflexion 55 42  Ankle inversion 35 20  Ankle eversion 20 unable   (Blank rows = not tested)   LLE PROM ankle DF/gastroc muscle length: 82 degrees  LOWER EXTREMITY MMT:  MMT Right eval Left eval  Hip flexion 5 5  Hip extension    Hip abduction 5 4  Hip adduction 5 5  Hip internal rotation    Hip external rotation    Knee flexion 5 5  Knee extension 5 5  Ankle dorsiflexion 5 0  Ankle plantarflexion 5 5  Ankle inversion 5 4  Ankle  eversion 5 0   (Blank rows = not tested)  LOWER EXTREMITY SPECIAL TESTS:  N/A  FUNCTIONAL TESTS: 09/17/22 5 times sit to stand: 7.54 seconds w/ no AD, L AFO Timed up and go (TUG): 5.65 seconds w/ no AD, L AFO 10 meter walk test: 1.79 m/s w/ no AD, L AFO  GAIT: Distance walked: 10 meters Assistive device utilized: Walker - 2 wheeled Level of assistance: Modified independence Comments: LLE TTWB using step to pattern with RW.   TODAY'S TREATMENT: DATE: 09/17/22  Beginning of session spent reviewing goals a pt requiring progress note. See clinical impression and goals section for details.   Neuro Re-Ed:  Obstacle course emphasizing toe clearance over 6, 6" hurdles and stepping onto airex pad- Reciprocal gait pattern noted with obstacle negotiation, no deficits with toe clearance when utilizing L AFO x3 down and back. CGA.   Therapeutic Exercises:  Single leg, leg press 3 rep max assessment performed comparing RLE- 105# to LLE- 65#  Seated calf raises on leg press machine, 45#, 3 x10. V/c needed to maintain L knee engagement, difficulty 2/2 to decreased L hamstring flexibility.   Mini squats with TRX straps 2 x12, verbal cueing needed to mind post hip precautions.   Wall sits w/ bilat LE's 3 x20 sec.   PATIENT EDUCATION:  Education details: Education on importance of L ankle mobility in case pt will need AFO in the future for L foot drop to prevent gastrocnemius contracture. Reviewed post hip precautions and TDWB precautions and importance to comply for tissue healing.  Person educated: Patient Education method: Medical illustrator Education comprehension: verbalized understanding, returned demonstration, and needs further education  HOME EXERCISE PROGRAM: Access Code: [email or web address] URL: [email or web address] Date: 08/05/2022 Prepared by: Ronnie Derby  Exercises - Prone Hip Extension  - 1 x daily - 3-4 x weekly - 3 sets - 8 reps - Clamshell  - 1 x  daily - 3-4 x weekly - 3 sets - 8 reps - Prone Hamstring Curl with Anchored Resistance  - 1 x daily - 3-4 x weekly - 3 sets - 8 reps - Supine Active Straight Leg Raise  - 1 x daily - 3-4 x weekly - 3 sets - 8 reps - Supine Hamstring Stretch with Strap  - 1 x daily - 7 x weekly - 1 sets - 3 reps - 30 hold  Access Code: [email or web address] URL: [email or web address] Date: 07/22/2022 Prepared by: Ronnie Derby  Exercises - Prone Hip Extension  - 1  x daily - 3-4 x weekly - 3 sets - 8 reps - Clamshell  - 1 x daily - 3-4 x weekly - 3 sets - 8 reps - Prone Hamstring Curl with Anchored Resistance  - 1 x daily - 3-4 x weekly - 3 sets - 8 reps - Supine Active Straight Leg Raise  - 1 x daily - 3-4 x weekly - 3 sets - 8 reps    Ankle pumps seated, LLE heel raise due to L foot drop: x20/LE    Supine gastroc stretch LLE with belt: 3x30 sec    Quad set LLE: x15   Heel slides LLE: x15 maintaining post hip precautions    SAQ's LLE: x15   Hip abduction supine: x15   Seated    LAQ's: x15/LLE   Standing:    LLE hip flexion maintaining post hip precautions: x15   LLE hip abduction: x15   LLE hip extension: x15  ASSESSMENT:  CLINICAL IMPRESSION: Progress assessment performed at today's session at it being pt's 10th visit. Pt has shown notable improvements in L hip strength, ROM, and pain. This is noted by achieving a majority of his short- term and long- term goals including surpassing his stair, TUG (5.65sec), (1.24m/s), and 5xSTS goal (7.54sec). By achieving these goals, this displays a significant decrease in falls risk for this pt. He continues to note decreased L ankle DF, requiring utilization of his L AFO throughout these assessments. Pt has recently been cleared by his surgeon of all posterior hip precautions, allowing for pt's PT exercises to be progressed to further strengthen the LLE. Pt would continue to benefit from skilled PT interventions to address strength, and functional  mobility deficits that remain present in the L LE to return to his PLOF.    OBJECTIVE IMPAIRMENTS: Abnormal gait, decreased activity tolerance, decreased balance, decreased mobility, difficulty walking, decreased ROM, decreased strength, impaired flexibility, impaired sensation, and pain.   ACTIVITY LIMITATIONS: carrying, lifting, bending, sitting, standing, squatting, sleeping, stairs, bathing, toileting, dressing, locomotion level, and caring for others  PARTICIPATION LIMITATIONS: cleaning, interpersonal relationship, driving, shopping, community activity, occupation, and yard work  PERSONAL FACTORS: Age, Fitness, Past/current experiences, Profession, Time since onset of injury/illness/exacerbation, and 3+ comorbidities: Alcohol use disorder (in early remission), CHF, cardiomyopathy, depression, anxiety  are also affecting patient's functional outcome.   REHAB POTENTIAL: Fair LLE foot drop, visit limit cap of 30 visits, multiple WB and hip precautions  CLINICAL DECISION MAKING: Evolving/moderate complexity  EVALUATION COMPLEXITY: Moderate   GOALS: Goals reviewed with patient? No  SHORT TERM GOALS: Target date: 08/25/22 Pt will be independent with HEP to improve L hip AROM and LLE strength to return to normalized gait mechanics with LRAD. Baseline: 07/14/22: HEP provided/discussed; 08/25/22: Performing as prescribed. Goal status: MET  2.  Pt will be able to ambulate > 500' with LRAD once WBAT, to complete household and short community distances.  Baseline: 07/14/22: TDWB on LLE, using RW only completing household distances. 09/17/22: Able to ambulate community distances  Goal status: MET  LONG TERM GOALS: Target date: 10/06/22  Pt will improve FOTO to target score to demonstrate clinically significant improvement in functional mobility. Baseline: 07/14/22: 38/67; 09/17/22: 60/67 Goal status: ONGOING  2.  Pt will improve TUG to < 12 sec with no AD to demo reduced falls risk and safe  ability toc complete STS and stand to sit transfer without need for DME. Baseline: 07/14/22: LLE TDWB using RW at 13.95 seconds; 09/17/22: 5.65sec no AD Goal status:MET  3.  Pt will improve 5xSTS in < 12 seconds with LLE WBAT and no UE support to demo significant improvement in LE strength and reduced falls risk. Baseline: 07/14/22: 11.33 seconds maintaining LLE TDWB and BUE support on arm rests; 09/17/22: 7.54sec Goal status: MET  4.  Pt will improve 10 meter gait speed to >/= 1.2 m/s with LRAD to demo ability to ambulate at safe velocity to return to mod-I completion of community walking tasks.  Baseline: 07/14/22: LLE TDWB using RW completing at .6 m/s; 09/17/22: 1.79 m/s Goal status: MET   5.  Pt will be able to asc/desc full flight of stairs with reciprocal pattern without rails to return to complete independence with accessing second story of parent's home to access his bedroom. Baseline: 07/14/22: unable to complete due to WB status 09/17/22: Able to ascend/ descend stairs x3 w/o hand rail support.  Goal status: MET  6. Pt will be able to improve LLE leg press to be =/> the RLE to demonstrate improvement in LLE strength to improve functional activities needed for work and in the home.  Baseline: 09/17/22- RLE- 105#/LLE- 65# Goal status: INITIAL     PLAN:  PT FREQUENCY: 1-2x/week  PT DURATION: 12 weeks  PLANNED INTERVENTIONS: Therapeutic exercises, Therapeutic activity, Neuromuscular re-education, Balance training, Gait training, Patient/Family education, Self Care, Joint mobilization, Stair training, Vestibular training, Canalith repositioning, Orthotic/Fit training, DME instructions, Dry Needling, Electrical stimulation, Spinal mobilization, Cryotherapy, Moist heat, scar mobilization, Manual therapy, and Re-evaluation  PLAN FOR NEXT SESSION: Update pt HEP. LLE strength/mobility and balance activities. CKC glut loading and quad strengthening   Lovie Macadamia, SPT  Delphia Grates. Fairly  IV, PT, DPT Physical Therapist- [address]  [facility]

## 2022-09-22 ENCOUNTER — Encounter: Payer: Self-pay | Admitting: Cardiology

## 2022-09-22 ENCOUNTER — Ambulatory Visit: Payer: BC Managed Care – PPO

## 2022-09-22 ENCOUNTER — Ambulatory Visit: Payer: BC Managed Care – PPO | Attending: Cardiology | Admitting: Cardiology

## 2022-09-22 ENCOUNTER — Telehealth: Payer: Self-pay | Admitting: Cardiology

## 2022-09-22 VITALS — BP 129/87 | HR 88 | Ht 69.0 in | Wt 168.8 lb

## 2022-09-22 DIAGNOSIS — M25552 Pain in left hip: Secondary | ICD-10-CM

## 2022-09-22 DIAGNOSIS — R0989 Other specified symptoms and signs involving the circulatory and respiratory systems: Secondary | ICD-10-CM | POA: Diagnosis not present

## 2022-09-22 DIAGNOSIS — I10 Essential (primary) hypertension: Secondary | ICD-10-CM

## 2022-09-22 DIAGNOSIS — M6281 Muscle weakness (generalized): Secondary | ICD-10-CM

## 2022-09-22 DIAGNOSIS — I426 Alcoholic cardiomyopathy: Secondary | ICD-10-CM | POA: Diagnosis not present

## 2022-09-22 DIAGNOSIS — I447 Left bundle-branch block, unspecified: Secondary | ICD-10-CM

## 2022-09-22 DIAGNOSIS — Z79899 Other long term (current) drug therapy: Secondary | ICD-10-CM

## 2022-09-22 DIAGNOSIS — R262 Difficulty in walking, not elsewhere classified: Secondary | ICD-10-CM

## 2022-09-22 NOTE — Progress Notes (Signed)
Cardiology Office Note:    Date:  09/25/2022   ID:  Thomas Williamson, DOB May 15, 1975, MRN [id number]  PCP:  Thomas Salisbury, MD  Cardiologist:  Thomas Ripple, DO  Electrophysiologist:  None   Referring MD: Thomas Salisbury, MD    History of Present Illness:    Thomas Williamson is a 47 y.o. 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   At his last visit I ordered an echo. Unfortunately he was unable to get this - he tells forgot.   He has started drinking alcohol on some days. Denies binge drinking and tells me he is thinking of rehab.    Past Medical History:  Diagnosis Date   Acetabulum fracture, left (HCC) 06/30/2022   Alcohol use disorder, severe, in early remission (HCC) 03/30/2018   Allergy    Anxiety    Bipolar disorder, in partial remission, most recent episode hypomanic (HCC) 03/30/2018   sees [facility] at [facility]   Bleeding internal hemorrhoids 09/12/2013   Cannabis dependence (HCC) 07/13/2018   Daily use   Cervical disc disease 11/20/2014   Chronic anxiety 07/13/2018   Chronic systolic (congestive) heart failure (HCC)    Complication of anesthesia    Depression    Dyslipidemia    GERD (gastroesophageal reflux disease)    Hernia, inguinal, bilateral 02/24/2011   High ankle sprain of right lower extremity 01/16/2015   History of hiatal hernia    Hx of hepatitis C 10/05/2017   -treated in 2019 -hepatology recommended no further surveillance needed except for LFTs with labs and see hepatologist if elevated   Hypertension    Lipids abnormal 09/29/2013   Pneumonia    PONV (postoperative nausea and vomiting)    Vitamin D deficiency 07/01/2022    Past Surgical History:  Procedure Laterality Date   ANTERIOR CRUCIATE  LIGAMENT REPAIR  2010   BIOPSY  03/13/2019   Procedure: BIOPSY;  Surgeon: Thomas Deeds, MD;  Location: [facility];  Service: Gastroenterology;;   COLONOSCOPY N/A 03/13/2019   per Dr. Adela Williamson, adenomatous polyps, repeat in 3 yrs   INGUINAL HERNIA REPAIR  02/23/2012   Procedure: LAPAROSCOPIC BILATERAL INGUINAL HERNIA REPAIR;  Surgeon: Thomas Cocking, MD;  Location: [facility];  Service: General;  Laterality: Bilateral;  Laparoscopic Bilateral Inguinal Hernia Repair with mesh   INSERTION OF MESH  02/23/2012   Procedure: INSERTION OF MESH;  Surgeon: Thomas Cocking, MD;  Location: [facility];  Service: General;  Laterality: N/A;   NOSE SURGERY  [id number]   ORIF ACETABULAR FRACTURE Left 06/30/2022   Procedure: OPEN REDUCTION INTERNAL FIXATION (ORIF) ACETABULAR FRACTURE;  Surgeon: Thomas Galas, MD;  Location: [facility];  Service: Orthopedics;  Laterality: Left;   POLYPECTOMY  03/13/2019   Procedure: POLYPECTOMY;  Surgeon: Thomas Deeds, MD;  Location: [facility];  Service: Gastroenterology;;    Current Medications: Current Meds  Medication Sig   amLODipine (NORVASC) 10 MG tablet TAKE 1 TABLET BY MOUTH EVERY DAY   buPROPion (WELLBUTRIN XL) 300 MG 24 hr tablet Take 1 tablet (300 mg total) by mouth daily.   busPIRone (BUSPAR) 5 MG tablet TAKE 1 TABLET BY MOUTH TWICE A DAY   docusate sodium (COLACE) 100 MG capsule Take 100 mg by mouth daily.   FLUoxetine (PROZAC)  20 MG capsule Take 20 mg by mouth daily.   folic acid (FOLVITE) 1 MG tablet Take 1 tablet (1 mg total) by mouth daily.   gabapentin (NEURONTIN) 100 MG capsule Take 100 mg by mouth 2 (two) times daily.   losartan-hydrochlorothiazide (HYZAAR) 100-25 MG tablet TAKE 1 TABLET BY MOUTH EVERY DAY   Magnesium Oxide 400 MG CAPS Take 1 capsule (400 mg total) by mouth 2 (two) times daily.   Omeprazole 20 MG TBEC Take 20 mg by mouth daily.    potassium chloride (KLOR-CON M) 10 MEQ tablet Take 1 tablet (10 mEq total) by mouth daily.    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  Tobacco Use   Smoking status: Some Days    Types: E-cigarettes   Smokeless tobacco: Never  Vaping Use   Vaping status: Former   Substances: CBD  Substance and Sexual Activity   Alcohol use: Yes    Alcohol/week: 4.0 standard drinks of alcohol    Types: 4 Shots of liquor per week    Comment: (recovering alcoholic) occassioanl drink - drank one double last night, did not drink Sunday   Drug use: Yes    Frequency: 2.0 times per week    Types: Marijuana   Sexual activity: Yes    Birth control/protection: Condom  Other Topics Concern   Not on file  Social History Narrative   Work or School: [employer] at [employer], 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, 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      Home Situation: lives alone      Spiritual Beliefs:      Lifestyle:       Hx alcohol abuse   Social Determinants of Health   Financial Resource Strain: Low Risk  (06/12/2022)   Received from [facility], [facility]   Overall Financial Resource Strain (CARDIA)    Difficulty of Paying Living Expenses: Not very hard  Recent Concern: Financial Resource Strain - High Risk (04/06/2022)   Received from [facility]   Overall Financial Resource Strain (CARDIA)    Difficulty of Paying Living Expenses: Very hard  Food Insecurity: No Food Insecurity (07/13/2022)   Hunger Vital Sign    Worried About Running Out of Food in the Last Year: Never true    Ran Out of Food in the Last Year: Never true  Transportation Needs: No Transportation Needs (07/13/2022)   PRAPARE - Administrator, Civil Service  (Medical): No    Lack of Transportation (Non-Medical): No  Physical Activity: Sufficiently Active (06/12/2022)   Received [facility], [facility]   Exercise Vital Sign    Days of Exercise per Week: 7 days    Minutes of Exercise per Session: 30 min  Stress: No Stress Concern Present (06/12/2022)   Received from [facility], [facility] - Occupational Stress Questionnaire    Feeling of Stress : Not at all  Social Connections: Socially Isolated (06/12/2022)   Received from [facility], [facility]   Social Connection and Isolation Panel [NHANES]    Frequency of Communication with Friends and Family: More than three times a week    Frequency of Social Gatherings with Friends and Family: Twice a week    Attends Religious Services: Never    Database administrator or Organizations: No    Attends Engineer, structural: Never    Marital Status: Never married     Family History: The patient's family history includes Atrial fibrillation in his mother; Cancer in his father; Congestive Heart Failure in his paternal grandmother; Diabetes in his brother and father; Heart attack in his maternal grandfather; Hypertension in his father and mother; Learning disabilities in his paternal aunt; Leukemia (age of onset: 39) in his father; Lung cancer in his maternal grandmother; Other in his brother; Prostate cancer (age of onset: 95) in his father. There is no history of Colon cancer.  ROS:   Review of Systems  Constitution: Negative for decreased appetite, fever and weight gain.  HENT: Negative for congestion, ear discharge, hoarse voice and sore throat.   Eyes: Negative for discharge, redness, vision loss in right eye and visual halos.  Cardiovascular: Negative for chest pain, dyspnea on exertion, leg swelling, orthopnea and palpitations.  Respiratory: Negative for cough, hemoptysis, shortness of breath and snoring.   Endocrine:  Negative for heat intolerance and polyphagia.  Hematologic/Lymphatic: Negative for bleeding problem. Does not bruise/bleed easily.  Skin: Negative for flushing, nail changes, rash and suspicious lesions.  Musculoskeletal: Negative for arthritis, joint pain, muscle cramps, myalgias, neck pain and stiffness.  Gastrointestinal: Negative for abdominal pain, bowel incontinence, diarrhea and excessive appetite.  Genitourinary: Negative for decreased libido, genital sores and incomplete emptying.  Neurological: Negative for brief paralysis, focal weakness, headaches and loss of balance.  Psychiatric/Behavioral: Negative for altered mental status, depression and suicidal ideas.  Allergic/Immunologic: Negative for HIV exposure and persistent infections.    EKGs/Labs/Other Studies Reviewed:    The following studies were reviewed today:   EKG:  The ekg ordered today demonstrates   Recent Labs: 07/03/2022: TSH 0.375 08/27/2022: ALT 12; Hemoglobin 12.7; Platelets 485 09/22/2022: BUN 10; Creatinine, Ser 0.97; Magnesium 2.0; Potassium 4.3; Sodium 138  Recent Lipid Panel    Component Value Date/Time   CHOL 279 (H) 03/10/2021 1625   TRIG (H) 03/10/2021 1625    403.0 Triglyceride is over 400; calculations on Lipids are invalid.   HDL 49.80 03/10/2021 1625   CHOLHDL 6 03/10/2021 1625   VLDL 63.2 (H) 09/19/2018 0947   LDLCALC 139 (H) 12/11/2019 1057   LDLDIRECT 175.0 03/10/2021 1625    Physical Exam:    VS:  BP 129/87 (BP Location: Left Arm, Patient Position: Sitting, Cuff Size: Normal)   Pulse 88   Ht 5\' 9"  (1.753 m)   Wt 168 lb 12.8 oz (76.6 kg)   SpO2 98%   BMI 24.93 kg/m     Wt Readings from Last 3 Encounters:  09/22/22 168 lb 12.8 oz (76.6 kg)  08/27/22 170 lb (77.1 kg)  07/20/22 175 lb (79.4 kg)     GEN: Well nourished, well developed in no acute distress HEENT: Normal NECK: No JVD; No carotid bruits LYMPHATICS: No lymphadenopathy CARDIAC: S1S2 noted,RRR, no murmurs, rubs,  gallops RESPIRATORY:  Clear to auscultation without rales, wheezing or rhonchi  ABDOMEN: Soft, non-tender, non-distended, +bowel sounds, no guarding. EXTREMITIES: No edema, No cyanosis, no clubbing MUSCULOSKELETAL:  No deformity  SKIN: Warm and dry NEUROLOGIC:  Alert and oriented x  3, non-focal PSYCHIATRIC:  Normal affect, good insight  ASSESSMENT:    1. Depressed left ventricular ejection fraction   2. Medication management   3. Cardiomyopathy, alcoholic (HCC)   4. Primary hypertension   5. Left bundle branch block (LBBB)     PLAN:    Has not been able to get his repeat echo - will reorder.  Will continue current medication regimen with GDMT: Losartan 100mg  and toprol xl 25 mg daily. If Ef does not improve will transition to entresto and plan to add aldactone and farxiga.   Recent monitor with evidence of atrial fibrillation.  BP at target.  He is considering inpatient etoh rehab, plans to discuss further after his echo result.   The patient is in agreement with the above plan. The patient left the office in stable condition.  The patient will follow up in   Medication Adjustments/Labs and Tests Ordered: Current medicines are reviewed at length with the patient today.  Concerns regarding medicines are outlined above.  Orders Placed This Encounter  Procedures   Magnesium   Basic Metabolic Panel (BMET)   ECHOCARDIOGRAM COMPLETE   No orders of the defined types were placed in this encounter.   Patient Instructions  Medication Instructions:  Your physician recommends that you continue on your current medications as directed. Please refer to the Current Medication list given to you today.  *If you need a refill on your cardiac medications before your next appointment, please call your pharmacy*   Lab Work: Your physician recommends that you have labs drawn today: BMET, Mag,  If you have labs (blood work) drawn today and your tests are completely normal, you will  receive your results only by: MyChart Message (if you have MyChart) OR A paper copy in the mail If you have any lab test that is abnormal or we need to change your treatment, we will call you to review the results.   Testing/Procedures: Your physician has requested that you have an echocardiogram. Echocardiography is a painless test that uses sound waves to create images of your heart. It provides your doctor with information about the size and shape of your heart and how well your heart's chambers and valves are working. This procedure takes approximately one hour. There are no restrictions for this procedure. Please do NOT wear cologne, perfume, aftershave, or lotions (deodorant is allowed). Please arrive 15 minutes prior to your appointment time.    Follow-Up: At [facility], you and your health needs are our priority.  As part of our continuing mission to provide you with exceptional heart care, we have created designated Provider Care Teams.  These Care Teams include your primary Cardiologist (physician) and Advanced Practice Providers (APPs -  Physician Assistants and Nurse Practitioners) who all work together to provide you with the care you need, when you need it.   Your next appointment:   16 week(s)  Provider:   Thomasene Ripple, DO    Adopting a Healthy Lifestyle.  Know what a healthy weight is for you (roughly BMI <25) and aim to maintain this   Aim for 7+ servings of fruits and vegetables daily   65-80+ fluid ounces of water or unsweet tea for healthy kidneys   Limit to max 1 drink of alcohol per day; avoid smoking/tobacco   Limit animal fats in diet for cholesterol and heart health - choose grass fed whenever available   Avoid highly processed foods, and foods high in saturated/trans fats   Aim for  low stress - take time to unwind and care for your mental health   Aim for 150 min of moderate intensity exercise weekly for heart health, and weights twice weekly  for bone health   Aim for 7-9 hours of sleep daily   When it comes to diets, agreement about the perfect plan isnt easy to find, even among the experts. Experts at the [facility] [employer] developed an idea known as the Healthy Eating Plate. Just imagine a plate divided into logical, healthy portions.   The emphasis is on diet quality:   Load up on vegetables and fruits - one-half of your plate: Aim for color and variety, and remember that potatoes dont count.   Go for whole grains - one-quarter of your plate: Whole wheat, barley, wheat berries, quinoa, oats, brown rice, and foods made with them. If you want pasta, go with whole wheat pasta.   Protein power - one-quarter of your plate: Fish, chicken, beans, and nuts are all healthy, versatile protein sources. Limit red meat.   The diet, however, does go beyond the plate, offering a few other suggestions.   Use healthy plant oils, such as olive, canola, soy, corn, sunflower and peanut. Check the labels, and avoid partially hydrogenated oil, which have unhealthy trans fats.   If youre thirsty, drink water. Coffee and tea are good in moderation, but skip sugary drinks and limit milk and dairy products to one or two daily servings.   The type of carbohydrate in the diet is more important than the amount. Some sources of carbohydrates, such as vegetables, fruits, whole grains, and beans-are healthier than others.   Finally, stay active  Osvaldo Shipper, DO  09/25/2022 10:05 PM    [facility]

## 2022-09-22 NOTE — Telephone Encounter (Signed)
Will forward this to Dr. Servando Salina and her nurse. Pt had an appointment this morning but forgot to mention this.

## 2022-09-22 NOTE — Telephone Encounter (Signed)
Called pt. He needs cardiac clearance before he can attend a alcohol rehabilitation program. Program is requesting this due to heart failure diagnoses. Pt gave verbal permission to release information to the provided person below.  [address] [place], [place] Thomas Williamson [phone]  Will get message to Dr. Servando Salina for review.

## 2022-09-22 NOTE — Telephone Encounter (Signed)
Left voicemail for patient to return call to office. 

## 2022-09-22 NOTE — Telephone Encounter (Signed)
Patient returned staff call regarding getting cardiologist's approval to attend a program.

## 2022-09-22 NOTE — Patient Instructions (Signed)
Medication Instructions:  Your physician recommends that you continue on your current medications as directed. Please refer to the Current Medication list given to you today.  *If you need a refill on your cardiac medications before your next appointment, please call your pharmacy*   Lab Work: Your physician recommends that you have labs drawn today: BMET, Mag,  If you have labs (blood work) drawn today and your tests are completely normal, you will receive your results only by: MyChart Message (if you have MyChart) OR A paper copy in the mail If you have any lab test that is abnormal or we need to change your treatment, we will call you to review the results.   Testing/Procedures: Your physician has requested that you have an echocardiogram. Echocardiography is a painless test that uses sound waves to create images of your heart. It provides your doctor with information about the size and shape of your heart and how well your heart's chambers and valves are working. This procedure takes approximately one hour. There are no restrictions for this procedure. Please do NOT wear cologne, perfume, aftershave, or lotions (deodorant is allowed). Please arrive 15 minutes prior to your appointment time.    Follow-Up: At [facility], you and your health needs are our priority.  As part of our continuing mission to provide you with exceptional heart care, we have created designated Provider Care Teams.  These Care Teams include your primary Cardiologist (physician) and Advanced Practice Providers (APPs -  Physician Assistants and Nurse Practitioners) who all work together to provide you with the care you need, when you need it.   Your next appointment:   16 week(s)  Provider:   Thomasene Ripple, DO

## 2022-09-22 NOTE — Therapy (Addendum)
OUTPATIENT PHYSICAL THERAPY LOWER EXTREMITY TREATMENT   Patient Name: Thomas Williamson MRN: [id number] DOB:August 13, 1975, 47 y.o., male Today's Date: 09/22/2022   END OF SESSION:  PT End of Session - 09/22/22 0814     Visit Number 11    Number of Visits 25    Date for PT Re-Evaluation 10/06/22    PT Start Time 0815    PT Stop Time 0858    PT Time Calculation (min) 43 min    Equipment Utilized During Treatment Gait belt    Activity Tolerance Patient tolerated treatment well    Behavior During Therapy WFL for tasks assessed/performed              Past Medical History:  Diagnosis Date   Acetabulum fracture, left (HCC) 06/30/2022   Alcohol use disorder, severe, in early remission (HCC) 03/30/2018   Allergy    Anxiety    Bipolar disorder, in partial remission, most recent episode hypomanic (HCC) 03/30/2018   [facility] Dr[facility]a [facility]   Bleeding internal hemorrhoids 09/12/2013   Cannabis dependence (HCC) 07/13/2018   Daily use   Cervical disc disease 11/20/2014   Chronic anxiety 07/13/2018   Chronic systolic (congestive) heart failure (HCC)    Complication of anesthesia    Depression    Dyslipidemia    GERD (gastroesophageal reflux disease)    Hernia, inguinal, bilateral 02/24/2011   High ankle sprain of right lower extremity 01/16/2015   History of hiatal hernia    Hx of hepatitis C 10/05/2017   -treated in 2019 -hepatology recommended no further surveillance needed except for LFTs with labs and see hepatologist if elevated   Hypertension    Lipids abnormal 09/29/2013   Pneumonia    PONV (postoperative nausea and vomiting)    Vitamin D deficiency 07/01/2022   Past Surgical History:  Procedure Laterality Date   ANTERIOR CRUCIATE LIGAMENT REPAIR  2010   BIOPSY  03/13/2019   Procedure: BIOPSY;  Surgeon: Benancio Deeds, MD;  Location: [facility];  Service: Gastroenterology;;   COLONOSCOPY N/A 03/13/2019   per Dr. Adela Lank, adenomatous  polyps, repeat in 3 yrs   INGUINAL HERNIA REPAIR  02/23/2012   Procedure: LAPAROSCOPIC BILATERAL INGUINAL HERNIA REPAIR;  Surgeon: Kandis Cocking, MD;  Location: [facility];  Service: General;  Laterality: Bilateral;  Laparoscopic Bilateral Inguinal Hernia Repair with mesh   INSERTION OF MESH  02/23/2012   Procedure: INSERTION OF MESH;  Surgeon: Kandis Cocking, MD;  Location: [facility];  Service: General;  Laterality: N/A;   NOSE SURGERY  [id number]   ORIF ACETABULAR FRACTURE Left 06/30/2022   Procedure: OPEN REDUCTION INTERNAL FIXATION (ORIF) ACETABULAR FRACTURE;  Surgeon: Myrene Galas, MD;  Location: [facility];  Service: Orthopedics;  Laterality: Left;   POLYPECTOMY  03/13/2019   Procedure: POLYPECTOMY;  Surgeon: Benancio Deeds, MD;  Location: [facility];  Service: Gastroenterology;;   Patient Active Problem List   Diagnosis Date Noted   Abnormal LFTs 07/08/2022   Left foot drop 07/01/2022   Vitamin D deficiency 07/01/2022   Acetabulum fracture, left (HCC) 06/30/2022   Alcohol dependence (HCC) 01/05/2022   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   Alcohol-induced acute pancreatitis without infection or necrosis 12/05/2021   Genetic testing 04/21/2019   Benign neoplasm of colon    Loose stools    History of colonic polyps 03/07/2019   Cardiac left ventricular ejection fraction 30-35 percent 03/07/2019   Alcoholic ketoacidosis 11/18/2018   Dyslipidemia 09/16/2018   Fever blister 09/16/2018  Cardiomyopathy, alcoholic (HCC) 09/16/2018   Left bundle branch block (LBBB) 09/16/2018   Cannabis dependence (HCC) 07/13/2018   Chronic anxiety 07/13/2018   Bipolar 1 disorder, depressed, full remission (HCC) 03/30/2018   Cervical disc disease 11/20/2014   Hypertension 02/24/2011    PCP: Nelwyn Salisbury, MD  REFERRING PROVIDER: Myrene Galas, MD  REFERRING DIAG: S72.009A (ICD-10-CM) - Hip fracture [facility])  THERAPY DIAG:  Muscle weakness (generalized)  Pain in left  hip  Difficulty in walking, not elsewhere classified  Rationale for Evaluation and Treatment: Rehabilitation  ONSET DATE: 06/30/22  SUBJECTIVE:   SUBJECTIVE STATEMENT:  Pt reports 2-3/10 NPS at L foot. He reports that he has not done anything too aggravating over the weekend, but the foot is still sore today. Pt addresses concerns of being "100%" before beginning his new job.   PERTINENT HISTORY: Pt is a pleasant 47 y.o. male s/p L acetabular fracture repair and hip dislocation after fall walking dog down stairs. Pt with subsequent LLE foot drop still since operation on 06/30/22. Reports managing well at home, just wants to be able to learn how to asc/desc steps and return to independence. Currently unemployed but has a job lined up for when he recovers that will require regular standing/walking, helping people perform ADL's. Denies falls since being home. Worst pain 8/10 NPS in L hip at surgical site. Reports burning pain along deep peroneal nerve distribution starting at fibular head traveling along lateral aspect of lower leg to dorsal part of foot. Pt with hypersensitivity to light touch and tenderness at fibular head.   PAIN:  Are you having pain? Yes: NPRS scale: 0/10 Pain location: L ankle/foot Pain description: burning in foot,  Aggravating factors: some LLE mobility but tolerable, touching L foot.  Relieving factors: Medication,    PRECAUTIONS: None, posterior hip precautions cleared by MD 09/17/22  WEIGHT BEARING RESTRICTIONS: Yes WBAT weaning to no crutches (08/21/22) , post hip precautions 12 weeks from 06/30/22  FALLS:  Has patient fallen in last 6 months? No  LIVING ENVIRONMENT: Lives with: lives with their family Lives in: House/apartment Stairs: Yes: Internal: flight steps; on right going up and External: 1 steps; none Has following equipment at home: Walker - 2 wheeled and bed side commode  OCCUPATION: unemployed before injury. 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 Reports needing to be able to be able to walk, help with group home ADL's.   PLOF: Independent  PATIENT GOALS: Return to independent  NEXT MD VISIT: PCP, 07/20/22 and Dr. Carola Frost on 07/27/22  OBJECTIVE:   DIAGNOSTIC FINDINGS:  CLINICAL DATA:  Postoperative status.   EXAM: DG HIP (WITH OR WITHOUT PELVIS) 1V*L*   COMPARISON:  CT scan of same day.   FINDINGS: No dislocation is noted. There appears to be improved alignment involving the posterior acetabular fracture noted on prior CT scan. No other bony abnormality is noted.   IMPRESSION: No dislocation is noted. Probable improved alignment involving posterior acetabular fracture on the left.     Electronically Signed   By: Lupita Raider M.D.  PATIENT SURVEYS:  FOTO Deferred to next session  COGNITION: Overall cognitive status: Within functional limits for tasks assessed     SENSATION: Light touch: Impaired  L4-L5 on L foot (great toe, medial malleolus)    POSTURE: No Significant postural limitations  PALPATION: TTP along LLE fibular head along deep peroneal nerve to dorsum of foot  LOWER EXTREMITY ROM:  Active ROM Right  eval Left eval  Hip flexion  90  Hip extension    Hip abduction    Hip adduction    Hip internal rotation    Hip external rotation 40 28  Knee flexion    Knee extension 0 0  Ankle dorsiflexion 10 Unable due to L foot drop  Ankle plantarflexion 55 42  Ankle inversion 35 20  Ankle eversion 20 unable   (Blank rows = not tested)   LLE PROM ankle DF/gastroc muscle length: 82 degrees  LOWER EXTREMITY MMT:  MMT Right eval Left eval  Hip flexion 5 5  Hip extension    Hip abduction 5 4  Hip adduction 5 5  Hip internal rotation    Hip external rotation    Knee flexion 5 5  Knee extension 5 5  Ankle dorsiflexion 5 0  Ankle plantarflexion 5 5  Ankle inversion 5 4  Ankle eversion 5 0   (Blank rows = not tested)  LOWER EXTREMITY SPECIAL TESTS:   N/A  FUNCTIONAL TESTS: 09/17/22 5 times sit to stand: 7.54 seconds w/ no AD, L AFO Timed up and go (TUG): 5.65 seconds w/ no AD, L AFO 10 meter walk test: 1.79 m/s w/ no AD, L AFO  GAIT: Distance walked: 10 meters Assistive device utilized: Walker - 2 wheeled Level of assistance: Modified independence Comments: LLE TTWB using step to pattern with RW.   TODAY'S TREATMENT: DATE: 09/22/22   Therapeutic Exercises:   x5 min recumbent bike for LE strengthening and mobility.   Single leg, leg press at [facility] machine RLE- 55# to LLE- 35#, 3 x8 therapist support to assist in facilitating eccentric control.   Seated knee extension at [facility] machine bilat LE's #45, 3 x8  Seated knee flexion at [employer] machine bilat LE's #45, 3 x8  Lateral stepping over 6" obstacle, multimodal cueing needed to facilitate proper motor control with exercise 3 x30 sec. CGA.  Mini squats with #6.6 medball, 3 x10   PATIENT EDUCATION:  Education details: Education on importance of L ankle mobility in case pt will need AFO in the future for L foot drop to prevent gastrocnemius contracture. Reviewed post hip precautions and TDWB precautions and importance to comply for tissue healing.  Person educated: Patient Education method: Medical illustrator Education comprehension: verbalized understanding, returned demonstration, and needs further education  HOME EXERCISE PROGRAM: Access Code: [email or web address] URL: [email or web address] Date: 08/05/2022 Prepared by: Ronnie Derby  Exercises - Prone Hip Extension  - 1 x daily - 3-4 x weekly - 3 sets - 8 reps - Clamshell  - 1 x daily - 3-4 x weekly - 3 sets - 8 reps - Prone Hamstring Curl with Anchored Resistance  - 1 x daily - 3-4 x weekly - 3 sets - 8 reps - Supine Active Straight Leg Raise  - 1 x daily - 3-4 x weekly - 3 sets - 8 reps - Supine Hamstring Stretch with Strap  - 1 x daily - 7 x weekly - 1 sets - 3 reps - 30 hold  Access Code:  [email or web address] URL: [email or web address] Date: 07/22/2022 Prepared by: Ronnie Derby  Exercises - Prone Hip Extension  - 1 x daily - 3-4 x weekly - 3 sets - 8 reps - Clamshell  - 1 x daily - 3-4 x weekly - 3 sets - 8 reps - Prone Hamstring Curl with Anchored Resistance  - 1 x daily - 3-4 x weekly - 3 sets - 8 reps - Supine Active Straight Leg  Raise  - 1 x daily - 3-4 x weekly - 3 sets - 8 reps    Ankle pumps seated, LLE heel raise due to L foot drop: x20/LE    Supine gastroc stretch LLE with belt: 3x30 sec    Quad set LLE: x15   Heel slides LLE: x15 maintaining post hip precautions    SAQ's LLE: x15   Hip abduction supine: x15   Seated    LAQ's: x15/LLE   Standing:    LLE hip flexion maintaining post hip precautions: x15   LLE hip abduction: x15   LLE hip extension: x15  ASSESSMENT:  CLINICAL IMPRESSION: Pt's session focused on bilat LE strengthening with emphasis on LLE loading and SLS activities. Pt continues to note progression in LLE strength noted with activity tolerance, during today's session. Pt displays decreased motor control with SLS activities, noting difficulty w/ placement of the RLE during LLE stance phase. Pt would continue to benefit from skilled PT interventions to address remaining strength and balance deficits to improve QoL and return to PLOF.   OBJECTIVE IMPAIRMENTS: Abnormal gait, decreased activity tolerance, decreased balance, decreased mobility, difficulty walking, decreased ROM, decreased strength, impaired flexibility, impaired sensation, and pain.   ACTIVITY LIMITATIONS: carrying, lifting, bending, sitting, standing, squatting, sleeping, stairs, bathing, toileting, dressing, locomotion level, and caring for others  PARTICIPATION LIMITATIONS: cleaning, interpersonal relationship, driving, shopping, community activity, occupation, and yard work  PERSONAL FACTORS: Age, Fitness, Past/current experiences, Profession, Time since onset of  injury/illness/exacerbation, and 3+ comorbidities: Alcohol use disorder (in early remission), CHF, cardiomyopathy, depression, anxiety  are also affecting patient's functional outcome.   REHAB POTENTIAL: Fair LLE foot drop, visit limit cap of 30 visits, multiple WB and hip precautions  CLINICAL DECISION MAKING: Evolving/moderate complexity  EVALUATION COMPLEXITY: Moderate   GOALS: Goals reviewed with patient? No  SHORT TERM GOALS: Target date: 08/25/22 Pt will be independent with HEP to improve L hip AROM and LLE strength to return to normalized gait mechanics with LRAD. Baseline: 07/14/22: HEP provided/discussed; 08/25/22: Performing as prescribed. Goal status: MET  2.  Pt will be able to ambulate > 500' with LRAD once WBAT, to complete household and short community distances.  Baseline: 07/14/22: TDWB on LLE, using RW only completing household distances. 09/17/22: Able to ambulate community distances  Goal status: MET   LONG TERM GOALS: Target date: 10/06/22  Pt will improve FOTO to target score to demonstrate clinically significant improvement in functional mobility. Baseline: 07/14/22: 38/67; 09/17/22: 60/67 Goal status: ONGOING  2.  Pt will improve TUG to < 12 sec with no AD to demo reduced falls risk and safe ability toc complete STS and stand to sit transfer without need for DME. Baseline: 07/14/22: LLE TDWB using RW at 13.95 seconds; 09/17/22: 5.65sec no AD Goal status:MET  3.  Pt will improve 5xSTS in < 12 seconds with LLE WBAT and no UE support to demo significant improvement in LE strength and reduced falls risk. Baseline: 07/14/22: 11.33 seconds maintaining LLE TDWB and BUE support on arm rests; 09/17/22: 7.54sec Goal status: MET  4.  Pt will improve 10 meter gait speed to >/= 1.2 m/s with LRAD to demo ability to ambulate at safe velocity to return to mod-I completion of community walking tasks.  Baseline: 07/14/22: LLE TDWB using RW completing at .6 m/s; 09/17/22: 1.79 m/s Goal  status: MET   5.  Pt will be able to asc/desc full flight of stairs with reciprocal pattern without rails to return to complete independence  with accessing second story of parent's home to access his bedroom. Baseline: 07/14/22: unable to complete due to WB status 09/17/22: Able to ascend/ descend stairs x3 w/o hand rail support.  Goal status: MET  6. Pt will be able to improve LLE leg press to be =/> the RLE to demonstrate improvement in LLE strength to improve functional activities needed for work and in the home.  Baseline: 09/17/22- RLE- 105#/LLE- 65# Goal status: INITIAL     PLAN:  PT FREQUENCY: 1-2x/week  PT DURATION: 12 weeks  PLANNED INTERVENTIONS: Therapeutic exercises, Therapeutic activity, Neuromuscular re-education, Balance training, Gait training, Patient/Family education, Self Care, Joint mobilization, Stair training, Vestibular training, Canalith repositioning, Orthotic/Fit training, DME instructions, Dry Needling, Electrical stimulation, Spinal mobilization, Cryotherapy, Moist heat, scar mobilization, Manual therapy, and Re-evaluation  PLAN FOR NEXT SESSION: Update pt HEP. Progress LLE strength/mobility and balance activities.   Lovie Macadamia, SPT  Delphia Grates. Fairly IV, PT, DPT Physical Therapist- [place]  [facility]

## 2022-09-22 NOTE — Telephone Encounter (Signed)
patient is calling to speak with Dr. Servando Salina or nurse...says that he forgot to mention it at appt this morning

## 2022-09-23 LAB — BASIC METABOLIC PANEL
BUN/Creatinine Ratio: 10 (ref 9–20)
BUN: 10 mg/dL (ref 6–24)
CO2: 24 mmol/L (ref 20–29)
Calcium: 10.6 mg/dL — ABNORMAL HIGH (ref 8.7–10.2)
Chloride: 98 mmol/L (ref 96–106)
Creatinine, Ser: 0.97 mg/dL (ref 0.76–1.27)
Glucose: 117 mg/dL — ABNORMAL HIGH (ref 70–99)
Potassium: 4.3 mmol/L (ref 3.5–5.2)
Sodium: 138 mmol/L (ref 134–144)
eGFR: 97 mL/min/{1.73_m2} (ref >59)

## 2022-09-23 LAB — MAGNESIUM: Magnesium: 2 mg/dL (ref 1.6–2.3)

## 2022-09-24 ENCOUNTER — Ambulatory Visit: Payer: BC Managed Care – PPO

## 2022-09-24 DIAGNOSIS — R262 Difficulty in walking, not elsewhere classified: Secondary | ICD-10-CM | POA: Diagnosis not present

## 2022-09-24 DIAGNOSIS — M6281 Muscle weakness (generalized): Secondary | ICD-10-CM

## 2022-09-24 DIAGNOSIS — M25552 Pain in left hip: Secondary | ICD-10-CM | POA: Diagnosis not present

## 2022-09-24 NOTE — Therapy (Addendum)
OUTPATIENT PHYSICAL THERAPY LOWER EXTREMITY TREATMENT   Patient Name: Renso Micheau MRN: [id number] DOB:03-16-1975, 47 y.o., male Today's Date: 09/24/2022   END OF SESSION:  PT End of Session - 09/24/22 1513     Visit Number 12    Number of Visits 25    Date for PT Re-Evaluation 10/06/22    PT Start Time 1515    PT Stop Time 1538    PT Time Calculation (min) 23 min    Equipment Utilized During Treatment Gait belt    Activity Tolerance Patient tolerated treatment well    Behavior During Therapy WFL for tasks assessed/performed              Past Medical History:  Diagnosis Date   Acetabulum fracture, left (HCC) 06/30/2022   Alcohol use disorder, severe, in early remission (HCC) 03/30/2018   Allergy    Anxiety    Bipolar disorder, in partial remission, most recent episode hypomanic (HCC) 03/30/2018   [facility] Dr[facility]a [facility]   Bleeding internal hemorrhoids 09/12/2013   Cannabis dependence (HCC) 07/13/2018   Daily use   Cervical disc disease 11/20/2014   Chronic anxiety 07/13/2018   Chronic systolic (congestive) heart failure (HCC)    Complication of anesthesia    Depression    Dyslipidemia    GERD (gastroesophageal reflux disease)    Hernia, inguinal, bilateral 02/24/2011   High ankle sprain of right lower extremity 01/16/2015   History of hiatal hernia    Hx of hepatitis C 10/05/2017   -treated in 2019 -hepatology recommended no further surveillance needed except for LFTs with labs and see hepatologist if elevated   Hypertension    Lipids abnormal 09/29/2013   Pneumonia    PONV (postoperative nausea and vomiting)    Vitamin D deficiency 07/01/2022   Past Surgical History:  Procedure Laterality Date   ANTERIOR CRUCIATE LIGAMENT REPAIR  2010   BIOPSY  03/13/2019   Procedure: BIOPSY;  Surgeon: Benancio Deeds, MD;  Location: [facility];  Service: Gastroenterology;;   COLONOSCOPY N/A 03/13/2019   per Dr. Adela Lank, adenomatous  polyps, repeat in 3 yrs   INGUINAL HERNIA REPAIR  02/23/2012   Procedure: LAPAROSCOPIC BILATERAL INGUINAL HERNIA REPAIR;  Surgeon: Kandis Cocking, MD;  Location: [facility];  Service: General;  Laterality: Bilateral;  Laparoscopic Bilateral Inguinal Hernia Repair with mesh   INSERTION OF MESH  02/23/2012   Procedure: INSERTION OF MESH;  Surgeon: Kandis Cocking, MD;  Location: [facility];  Service: General;  Laterality: N/A;   NOSE SURGERY  [id number]   ORIF ACETABULAR FRACTURE Left 06/30/2022   Procedure: OPEN REDUCTION INTERNAL FIXATION (ORIF) ACETABULAR FRACTURE;  Surgeon: Myrene Galas, MD;  Location: [facility];  Service: Orthopedics;  Laterality: Left;   POLYPECTOMY  03/13/2019   Procedure: POLYPECTOMY;  Surgeon: Benancio Deeds, MD;  Location: [facility];  Service: Gastroenterology;;   Patient Active Problem List   Diagnosis Date Noted   Abnormal LFTs 07/08/2022   Left foot drop 07/01/2022   Vitamin D deficiency 07/01/2022   Acetabulum fracture, left (HCC) 06/30/2022   Alcohol dependence (HCC) 01/05/2022   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   Alcohol-induced acute pancreatitis without infection or necrosis 12/05/2021   Genetic testing 04/21/2019   Benign neoplasm of colon    Loose stools    History of colonic polyps 03/07/2019   Cardiac left ventricular ejection fraction 30-35 percent 03/07/2019   Alcoholic ketoacidosis 11/18/2018   Dyslipidemia 09/16/2018   Fever blister 09/16/2018  Cardiomyopathy, alcoholic (HCC) 09/16/2018   Left bundle branch block (LBBB) 09/16/2018   Cannabis dependence (HCC) 07/13/2018   Chronic anxiety 07/13/2018   Bipolar 1 disorder, depressed, full remission (HCC) 03/30/2018   Cervical disc disease 11/20/2014   Hypertension 02/24/2011    PCP: Nelwyn Salisbury, MD  REFERRING PROVIDER: Myrene Galas, MD  REFERRING DIAG: S72.009A (ICD-10-CM) - Hip fracture [facility])  THERAPY DIAG:  Muscle weakness (generalized)  Pain in left  hip  Difficulty in walking, not elsewhere classified  Rationale for Evaluation and Treatment: Rehabilitation  ONSET DATE: 06/30/22  SUBJECTIVE:   SUBJECTIVE STATEMENT:  Pt reports 1/10 NPS at L foot. He reports that his MD increased his gabapentin prescription and he'll see if there are any notable changes.   PERTINENT HISTORY: Pt is a pleasant 47 y.o. male s/p L acetabular fracture repair and hip dislocation after fall walking dog down stairs. Pt with subsequent LLE foot drop still since operation on 06/30/22. Reports managing well at home, just wants to be able to learn how to asc/desc steps and return to independence. Currently unemployed but has a job lined up for when he recovers that will require regular standing/walking, helping people perform ADL's. Denies falls since being home. Worst pain 8/10 NPS in L hip at surgical site. Reports burning pain along deep peroneal nerve distribution starting at fibular head traveling along lateral aspect of lower leg to dorsal part of foot. Pt with hypersensitivity to light touch and tenderness at fibular head.   PAIN:  Are you having pain? Yes: NPRS scale: 1/10 Pain location: L ankle/foot Pain description: burning in foot,  Aggravating factors: some LLE mobility but tolerable, touching L foot.  Relieving factors: Medication,    PRECAUTIONS: None, posterior hip precautions cleared by MD 09/17/22  WEIGHT BEARING RESTRICTIONS: Yes WBAT weaning to no crutches (08/21/22) , post hip precautions 12 weeks from 06/30/22  FALLS:  Has patient fallen in last 6 months? No  LIVING ENVIRONMENT: Lives with: lives with their family Lives in: House/apartment Stairs: Yes: Internal: flight steps; on right going up and External: 1 steps; none Has following equipment at home: Walker - 2 wheeled and bed side commode  OCCUPATION: unemployed before injury. 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 Reports needing to be able to be able to walk,  help with group home ADL's.   PLOF: Independent  PATIENT GOALS: Return to independent  NEXT MD VISIT: PCP, 07/20/22 and Dr. Carola Frost on 07/27/22  OBJECTIVE:   DIAGNOSTIC FINDINGS:  CLINICAL DATA:  Postoperative status.   EXAM: DG HIP (WITH OR WITHOUT PELVIS) 1V*L*   COMPARISON:  CT scan of same day.   FINDINGS: No dislocation is noted. There appears to be improved alignment involving the posterior acetabular fracture noted on prior CT scan. No other bony abnormality is noted.   IMPRESSION: No dislocation is noted. Probable improved alignment involving posterior acetabular fracture on the left.     Electronically Signed   By: Lupita Raider M.D.  PATIENT SURVEYS:  FOTO Deferred to next session  COGNITION: Overall cognitive status: Within functional limits for tasks assessed     SENSATION: Light touch: Impaired  L4-L5 on L foot (great toe, medial malleolus)    POSTURE: No Significant postural limitations  PALPATION: TTP along LLE fibular head along deep peroneal nerve to dorsum of foot  LOWER EXTREMITY ROM:  Active ROM Right eval Left eval  Hip flexion  90  Hip extension  Hip abduction    Hip adduction    Hip internal rotation    Hip external rotation 40 28  Knee flexion    Knee extension 0 0  Ankle dorsiflexion 10 Unable due to L foot drop  Ankle plantarflexion 55 42  Ankle inversion 35 20  Ankle eversion 20 unable   (Blank rows = not tested)   LLE PROM ankle DF/gastroc muscle length: 82 degrees  LOWER EXTREMITY MMT:  MMT Right eval Left eval  Hip flexion 5 5  Hip extension    Hip abduction 5 4  Hip adduction 5 5  Hip internal rotation    Hip external rotation    Knee flexion 5 5  Knee extension 5 5  Ankle dorsiflexion 5 0  Ankle plantarflexion 5 5  Ankle inversion 5 4  Ankle eversion 5 0   (Blank rows = not tested)  LOWER EXTREMITY SPECIAL TESTS:  N/A  FUNCTIONAL TESTS: 09/17/22 5 times sit to stand: 7.54 seconds w/ no AD, L  AFO Timed up and go (TUG): 5.65 seconds w/ no AD, L AFO 10 meter walk test: 1.79 m/s w/ no AD, L AFO  GAIT: Distance walked: 10 meters Assistive device utilized: Walker - 2 wheeled Level of assistance: Modified independence Comments: LLE TTWB using step to pattern with RW.   TODAY'S TREATMENT: DATE: 09/24/22   Therapeutic Exercises:  Standing mini squats w/ bilat UE support 2 x10  Standing lunges LLE forward 2/2 AFO w/ single UE support 2 x10  Standing hip abduction RLE/ LLE w/ RTB 2 x10/ each side w/ bilat UE support   Standing hip extension RLE/ LLE w/ RTB 1 x10/ each side w/ bilat UE support   Reviewed updated HEP (reps/sets/frequency)  PATIENT EDUCATION:  Education details: Education on importance of L ankle mobility in case pt will need AFO in the future for L foot drop to prevent gastrocnemius contracture. Reviewed post hip precautions and TDWB precautions and importance to comply for tissue healing.  Person educated: Patient Education method: Medical illustrator Education comprehension: verbalized understanding, returned demonstration, and needs further education  HOME EXERCISE PROGRAM: Access Code: [email or web address] URL: [email or web address] Date: 09/24/2022 Prepared by: Ronnie Derby  Exercises - Supine Hamstring Stretch with Strap  - 1 x daily - 7 x weekly - 1 sets - 3 reps - 30 hold - Clamshell  - 1 x daily - 3-4 x weekly - 3 sets - 8 reps - Mini Squat with Counter Support  - 1 x daily - 3-4 x weekly - 3 sets - 8 reps - Lunge with Counter Support  - 1 x daily - 3-4 x weekly - 3 sets - 8 reps - Standing Hip Abduction with Counter Support  - 1 x daily - 3-4 x weekly - 3 sets - 8 reps - Standing Hip Extension with Counter Support  - 1 x daily - 3-4 x weekly - 3 sets - 8 reps  ASSESSMENT:  CLINICAL IMPRESSION:  Pt needed to end today's session early 2/2 to a prior planned appointment. Session focused on updating pt's HEP to promote improvement  in bilat LE strengthening with emphasis on LLE loading and SLS activities. Pt tolerated exercise additions well noting no increase in pain, and verbalized understanding of expectations of updated HEP. Though pt notes decreased pain with activity, pt continues to display weakness in L hip and foot/ankle DF strength and would continue to benefit from skilled PT interventions to address remaining strength and balance deficits to return  to PLOF.   OBJECTIVE IMPAIRMENTS: Abnormal gait, decreased activity tolerance, decreased balance, decreased mobility, difficulty walking, decreased ROM, decreased strength, impaired flexibility, impaired sensation, and pain.   ACTIVITY LIMITATIONS: carrying, lifting, bending, sitting, standing, squatting, sleeping, stairs, bathing, toileting, dressing, locomotion level, and caring for others  PARTICIPATION LIMITATIONS: cleaning, interpersonal relationship, driving, shopping, community activity, occupation, and yard work  PERSONAL FACTORS: Age, Fitness, Past/current experiences, Profession, Time since onset of injury/illness/exacerbation, and 3+ comorbidities: Alcohol use disorder (in early remission), CHF, cardiomyopathy, depression, anxiety  are also affecting patient's functional outcome.   REHAB POTENTIAL: Fair LLE foot drop, visit limit cap of 30 visits, multiple WB and hip precautions  CLINICAL DECISION MAKING: Evolving/moderate complexity  EVALUATION COMPLEXITY: Moderate   GOALS: Goals reviewed with patient? No  SHORT TERM GOALS: Target date: 08/25/22 Pt will be independent with HEP to improve L hip AROM and LLE strength to return to normalized gait mechanics with LRAD. Baseline: 07/14/22: HEP provided/discussed; 08/25/22: Performing as prescribed. Goal status: MET  2.  Pt will be able to ambulate > 500' with LRAD once WBAT, to complete household and short community distances.  Baseline: 07/14/22: TDWB on LLE, using RW only completing household distances.  09/17/22: Able to ambulate community distances  Goal status: MET   LONG TERM GOALS: Target date: 10/06/22  Pt will improve FOTO to target score to demonstrate clinically significant improvement in functional mobility. Baseline: 07/14/22: 38/67; 09/17/22: 60/67 Goal status: ONGOING  2.  Pt will improve TUG to < 12 sec with no AD to demo reduced falls risk and safe ability toc complete STS and stand to sit transfer without need for DME. Baseline: 07/14/22: LLE TDWB using RW at 13.95 seconds; 09/17/22: 5.65sec no AD Goal status:MET  3.  Pt will improve 5xSTS in < 12 seconds with LLE WBAT and no UE support to demo significant improvement in LE strength and reduced falls risk. Baseline: 07/14/22: 11.33 seconds maintaining LLE TDWB and BUE support on arm rests; 09/17/22: 7.54sec Goal status: MET  4.  Pt will improve 10 meter gait speed to >/= 1.2 m/s with LRAD to demo ability to ambulate at safe velocity to return to mod-I completion of community walking tasks.  Baseline: 07/14/22: LLE TDWB using RW completing at .6 m/s; 09/17/22: 1.79 m/s Goal status: MET   5.  Pt will be able to asc/desc full flight of stairs with reciprocal pattern without rails to return to complete independence with accessing second story of parent's home to access his bedroom. Baseline: 07/14/22: unable to complete due to WB status 09/17/22: Able to ascend/ descend stairs x3 w/o hand rail support.  Goal status: MET  6. Pt will be able to improve LLE leg press to be =/> the RLE to demonstrate improvement in LLE strength to improve functional activities needed for work and in the home.  Baseline: 09/17/22- RLE- 105#/LLE- 65# Goal status: INITIAL     PLAN:  PT FREQUENCY: 1-2x/week  PT DURATION: 12 weeks  PLANNED INTERVENTIONS: Therapeutic exercises, Therapeutic activity, Neuromuscular re-education, Balance training, Gait training, Patient/Family education, Self Care, Joint mobilization, Stair training, Vestibular training,  Canalith repositioning, Orthotic/Fit training, DME instructions, Dry Needling, Electrical stimulation, Spinal mobilization, Cryotherapy, Moist heat, scar mobilization, Manual therapy, and Re-evaluation  PLAN FOR NEXT SESSION: Assess tolerance to updated HEP exercises, Progress LLE strength/mobility and balance activities.   Lovie Macadamia, SPT  Delphia Grates. Fairly IV, PT, DPT Physical Therapist- [place]  [facility]

## 2022-09-28 ENCOUNTER — Ambulatory Visit (HOSPITAL_COMMUNITY)
Admission: RE | Admit: 2022-09-28 | Discharge: 2022-09-28 | Disposition: A | Discharge status: Home / Self Care | Payer: BC Managed Care – PPO | Source: Ambulatory Visit | Attending: Cardiology | Admitting: Cardiology

## 2022-09-28 DIAGNOSIS — R0989 Other specified symptoms and signs involving the circulatory and respiratory systems: Secondary | ICD-10-CM | POA: Diagnosis not present

## 2022-09-28 DIAGNOSIS — I110 Hypertensive heart disease with heart failure: Secondary | ICD-10-CM | POA: Insufficient documentation

## 2022-09-28 DIAGNOSIS — I429 Cardiomyopathy, unspecified: Secondary | ICD-10-CM | POA: Diagnosis not present

## 2022-09-28 DIAGNOSIS — I509 Heart failure, unspecified: Secondary | ICD-10-CM | POA: Insufficient documentation

## 2022-09-28 DIAGNOSIS — I447 Left bundle-branch block, unspecified: Secondary | ICD-10-CM | POA: Diagnosis not present

## 2022-09-28 DIAGNOSIS — E785 Hyperlipidemia, unspecified: Secondary | ICD-10-CM | POA: Diagnosis not present

## 2022-09-28 DIAGNOSIS — I501 Left ventricular failure: Secondary | ICD-10-CM

## 2022-09-28 LAB — ECHOCARDIOGRAM COMPLETE
Area-P 1/2: 4.21 cm2
Calc EF: 30 %
S' Lateral: 4.2 cm
Single Plane A2C EF: 28.4 %
Single Plane A4C EF: 29.8 %

## 2022-09-28 NOTE — Progress Notes (Signed)
  Echocardiogram 2D Echocardiogram has been performed.  Thomas Williamson 09/28/2022, 10:59 AM

## 2022-09-29 ENCOUNTER — Telehealth: Payer: Self-pay | Admitting: Cardiology

## 2022-09-29 ENCOUNTER — Ambulatory Visit: Payer: BC Managed Care – PPO

## 2022-09-29 DIAGNOSIS — M25552 Pain in left hip: Secondary | ICD-10-CM | POA: Diagnosis not present

## 2022-09-29 DIAGNOSIS — M6281 Muscle weakness (generalized): Secondary | ICD-10-CM

## 2022-09-29 DIAGNOSIS — R262 Difficulty in walking, not elsewhere classified: Secondary | ICD-10-CM

## 2022-09-29 NOTE — Therapy (Addendum)
OUTPATIENT PHYSICAL THERAPY LOWER EXTREMITY TREATMENT   Patient Name: Thomas Williamson MRN: [id number] DOB:07-25-1975, 47 y.o., male Today's Date: 09/29/2022   END OF SESSION:  PT End of Session - 09/29/22 0906     Visit Number 13    Number of Visits 25    Date for PT Re-Evaluation 10/06/22    PT Start Time 0905    PT Stop Time 0945    PT Time Calculation (min) 40 min    Equipment Utilized During Treatment Gait belt    Activity Tolerance Patient tolerated treatment well    Behavior During Therapy WFL for tasks assessed/performed               Past Medical History:  Diagnosis Date   Acetabulum fracture, left (HCC) 06/30/2022   Alcohol use disorder, severe, in early remission (HCC) 03/30/2018   Allergy    Anxiety    Bipolar disorder, in partial remission, most recent episode hypomanic (HCC) 03/30/2018   [facility] Dr. [facility]a [facility]   Bleeding internal hemorrhoids 09/12/2013   Cannabis dependence (HCC) 07/13/2018   Daily use   Cervical disc disease 11/20/2014   Chronic anxiety 07/13/2018   Chronic systolic (congestive) heart failure (HCC)    Complication of anesthesia    Depression    Dyslipidemia    GERD (gastroesophageal reflux disease)    Hernia, inguinal, bilateral 02/24/2011   High ankle sprain of right lower extremity 01/16/2015   History of hiatal hernia    Hx of hepatitis C 10/05/2017   -treated in 2019 -hepatology recommended no further surveillance needed except for LFTs with labs and see hepatologist if elevated   Hypertension    Lipids abnormal 09/29/2013   Pneumonia    PONV (postoperative nausea and vomiting)    Vitamin D deficiency 07/01/2022   Past Surgical History:  Procedure Laterality Date   ANTERIOR CRUCIATE LIGAMENT REPAIR  2010   BIOPSY  03/13/2019   Procedure: BIOPSY;  Surgeon: Benancio Deeds, MD;  Location: [facility];  Service: Gastroenterology;;   COLONOSCOPY N/A 03/13/2019   per Dr. Adela Lank, adenomatous  polyps, repeat in 3 yrs   INGUINAL HERNIA REPAIR  02/23/2012   Procedure: LAPAROSCOPIC BILATERAL INGUINAL HERNIA REPAIR;  Surgeon: Kandis Cocking, MD;  Location: [facility];  Service: General;  Laterality: Bilateral;  Laparoscopic Bilateral Inguinal Hernia Repair with mesh   INSERTION OF MESH  02/23/2012   Procedure: INSERTION OF MESH;  Surgeon: Kandis Cocking, MD;  Location: [facility];  Service: General;  Laterality: N/A;   NOSE SURGERY  [id number]   ORIF ACETABULAR FRACTURE Left 06/30/2022   Procedure: OPEN REDUCTION INTERNAL FIXATION (ORIF) ACETABULAR FRACTURE;  Surgeon: Myrene Galas, MD;  Location: [facility];  Service: Orthopedics;  Laterality: Left;   POLYPECTOMY  03/13/2019   Procedure: POLYPECTOMY;  Surgeon: Benancio Deeds, MD;  Location: [facility];  Service: Gastroenterology;;   Patient Active Problem List   Diagnosis Date Noted   Abnormal LFTs 07/08/2022   Left foot drop 07/01/2022   Vitamin D deficiency 07/01/2022   Acetabulum fracture, left (HCC) 06/30/2022   Alcohol dependence (HCC) 01/05/2022   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   Alcohol-induced acute pancreatitis without infection or necrosis 12/05/2021   Genetic testing 04/21/2019   Benign neoplasm of colon    Loose stools    History of colonic polyps 03/07/2019   Cardiac left ventricular ejection fraction 30-35 percent 03/07/2019   Alcoholic ketoacidosis 11/18/2018   Dyslipidemia 09/16/2018   Fever blister 09/16/2018  Cardiomyopathy, alcoholic (HCC) 09/16/2018   Left bundle branch block (LBBB) 09/16/2018   Cannabis dependence (HCC) 07/13/2018   Chronic anxiety 07/13/2018   Bipolar 1 disorder, depressed, full remission (HCC) 03/30/2018   Cervical disc disease 11/20/2014   Hypertension 02/24/2011    PCP: Nelwyn Salisbury, MD  REFERRING PROVIDER: Myrene Galas, MD  REFERRING DIAG: S72.009A (ICD-10-CM) - Hip fracture [facility])  THERAPY DIAG:  Muscle weakness (generalized)  Pain in left  hip  Difficulty in walking, not elsewhere classified  Rationale for Evaluation and Treatment: Rehabilitation  ONSET DATE: 06/30/22   SUBJECTIVE:   SUBJECTIVE STATEMENT:  Pt reports 1/10 NPS at L foot. Pt addresses concerns of L great toe pain w/ ext. No notable changes over the weekend.   PERTINENT HISTORY: Pt is a pleasant 47 y.o. male s/p L acetabular fracture repair and hip dislocation after fall walking dog down stairs. Pt with subsequent LLE foot drop still since operation on 06/30/22. Reports managing well at home, just wants to be able to learn how to asc/desc steps and return to independence. Currently unemployed but has a job lined up for when he recovers that will require regular standing/walking, helping people perform ADL's. Denies falls since being home. Worst pain 8/10 NPS in L hip at surgical site. Reports burning pain along deep peroneal nerve distribution starting at fibular head traveling along lateral aspect of lower leg to dorsal part of foot. Pt with hypersensitivity to light touch and tenderness at fibular head.   PAIN:  Are you having pain? Yes: NPRS scale: 1/10 Pain location: L ankle/foot Pain description: burning in foot,  Aggravating factors: some LLE mobility but tolerable, touching L foot.  Relieving factors: Medication,    PRECAUTIONS: None, posterior hip precautions cleared by MD 09/17/22  WEIGHT BEARING RESTRICTIONS: Yes WBAT weaning to no crutches (08/21/22) , post hip precautions 12 weeks from 06/30/22  FALLS:  Has patient fallen in last 6 months? No  LIVING ENVIRONMENT: Lives with: lives with their family Lives in: House/apartment Stairs: Yes: Internal: flight steps; on right going up and External: 1 steps; none Has following equipment at home: Walker - 2 wheeled and bed side commode  OCCUPATION: unemployed before injury. 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 Reports needing to be able to be able to walk, help with group home  ADL's.   PLOF: Independent  PATIENT GOALS: Return to independent  NEXT MD VISIT: PCP, 07/20/22 and Dr. Carola Frost on 07/27/22  OBJECTIVE:   DIAGNOSTIC FINDINGS:  CLINICAL DATA:  Postoperative status.   EXAM: DG HIP (WITH OR WITHOUT PELVIS) 1V*L*   COMPARISON:  CT scan of same day.   FINDINGS: No dislocation is noted. There appears to be improved alignment involving the posterior acetabular fracture noted on prior CT scan. No other bony abnormality is noted.   IMPRESSION: No dislocation is noted. Probable improved alignment involving posterior acetabular fracture on the left.     Electronically Signed   By: Lupita Raider M.D.  PATIENT SURVEYS:  FOTO Deferred to next session  COGNITION: Overall cognitive status: Within functional limits for tasks assessed     SENSATION: Light touch: Impaired  L4-L5 on L foot (great toe, medial malleolus)    POSTURE: No Significant postural limitations  PALPATION: TTP along LLE fibular head along deep peroneal nerve to dorsum of foot  LOWER EXTREMITY ROM:  Active ROM Right eval Left eval  Hip flexion  90  Hip extension  Hip abduction    Hip adduction    Hip internal rotation    Hip external rotation 40 28  Knee flexion    Knee extension 0 0  Ankle dorsiflexion 10 Unable due to L foot drop  Ankle plantarflexion 55 42  Ankle inversion 35 20  Ankle eversion 20 unable   (Blank rows = not tested)   LLE PROM ankle DF/gastroc muscle length: 82 degrees  LOWER EXTREMITY MMT:  MMT Right eval Left eval  Hip flexion 5 5  Hip extension    Hip abduction 5 4  Hip adduction 5 5  Hip internal rotation    Hip external rotation    Knee flexion 5 5  Knee extension 5 5  Ankle dorsiflexion 5 0  Ankle plantarflexion 5 5  Ankle inversion 5 4  Ankle eversion 5 0   (Blank rows = not tested)  LOWER EXTREMITY SPECIAL TESTS:  N/A  FUNCTIONAL TESTS: 09/17/22 5 times sit to stand: 7.54 seconds w/ no AD, L AFO Timed up and go  (TUG): 5.65 seconds w/ no AD, L AFO 10 meter walk test: 1.79 m/s w/ no AD, L AFO  GAIT: Distance walked: 10 meters Assistive device utilized: Walker - 2 wheeled Level of assistance: Modified independence Comments: LLE TTWB using step to pattern with RW.   TODAY'S TREATMENT: DATE: 09/29/22  PROM:   Great Toe PROM: Extension performed for 3 x30 sec each. First Metatarsal Mobilizations: A<>P grade 1-2 mobilizations, 3 x30 sec each. Metatarsal Splaying: Performed for metatarsals 1 through 5.   Therapeutic Exercises:  Seated  L toe slides emphasizing great toe ext 3 x30sec hold   Single leg, LLE leg press 45# 3 x8  LLE single leg knee extension 20# at MATRIX machine 3 x8  LLE single leg knee flexion 25# at MATRIX machine 3 x8  Lateral step over 6" obstacle 2 x 30sec.   PATIENT EDUCATION:  Education details: Education on importance of L ankle mobility in case pt will need AFO in the future for L foot drop to prevent gastrocnemius contracture. Reviewed post hip precautions and TDWB precautions and importance to comply for tissue healing.  Person educated: Patient Education method: Medical illustrator Education comprehension: verbalized understanding, returned demonstration, and needs further education  HOME EXERCISE PROGRAM: Access Code: [email or web address] URL: [email or web address] Date: 09/24/2022 Prepared by: Ronnie Derby  Exercises - Supine Hamstring Stretch with Strap  - 1 x daily - 7 x weekly - 1 sets - 3 reps - 30 hold - Clamshell  - 1 x daily - 3-4 x weekly - 3 sets - 8 reps - Mini Squat with Counter Support  - 1 x daily - 3-4 x weekly - 3 sets - 8 reps - Lunge with Counter Support  - 1 x daily - 3-4 x weekly - 3 sets - 8 reps - Standing Hip Abduction with Counter Support  - 1 x daily - 3-4 x weekly - 3 sets - 8 reps - Standing Hip Extension with Counter Support  - 1 x daily - 3-4 x weekly - 3 sets - 8 reps  ASSESSMENT:  CLINICAL  IMPRESSION: Session focused on assessing joint mobility of L great toe ext and LLE strengthening activities. Pt notes decreased mobility in L great toe with concordant pain. Upon examination of L great toe, increased adduction was noted with significant swelling of the 1st metatarsal. Exercises provided for the pt to assist with improving joint mobility. This is likely a result of joint stiffness  and muscle tightness due to inability to actively dorsiflex and perform toe extension from foot drop. Pt continues to note decreased strength in the LLE> RLE displayed with exercises performed at today's session and increased muscle fatigue after exercises. Pt would continue to benefit from skilled PT interventions to address L hip and foot/ankle DF strength deficits to return to PLOF and improve QoL.  OBJECTIVE IMPAIRMENTS: Abnormal gait, decreased activity tolerance, decreased balance, decreased mobility, difficulty walking, decreased ROM, decreased strength, impaired flexibility, impaired sensation, and pain.   ACTIVITY LIMITATIONS: carrying, lifting, bending, sitting, standing, squatting, sleeping, stairs, bathing, toileting, dressing, locomotion level, and caring for others  PARTICIPATION LIMITATIONS: cleaning, interpersonal relationship, driving, shopping, community activity, occupation, and yard work  PERSONAL FACTORS: Age, Fitness, Past/current experiences, Profession, Time since onset of injury/illness/exacerbation, and 3+ comorbidities: Alcohol use disorder (in early remission), CHF, cardiomyopathy, depression, anxiety  are also affecting patient's functional outcome.   REHAB POTENTIAL: Fair LLE foot drop, visit limit cap of 30 visits, multiple WB and hip precautions  CLINICAL DECISION MAKING: Evolving/moderate complexity  EVALUATION COMPLEXITY: Moderate   GOALS: Goals reviewed with patient? No  SHORT TERM GOALS: Target date: 08/25/22 Pt will be independent with HEP to improve L hip AROM and  LLE strength to return to normalized gait mechanics with LRAD. Baseline: 07/14/22: HEP provided/discussed; 08/25/22: Performing as prescribed. Goal status: MET  2.  Pt will be able to ambulate > 500' with LRAD once WBAT, to complete household and short community distances.  Baseline: 07/14/22: TDWB on LLE, using RW only completing household distances. 09/17/22: Able to ambulate community distances  Goal status: MET   LONG TERM GOALS: Target date: 10/06/22  Pt will improve FOTO to target score to demonstrate clinically significant improvement in functional mobility. Baseline: 07/14/22: 38/67; 09/17/22: 60/67 Goal status: ONGOING  2.  Pt will improve TUG to < 12 sec with no AD to demo reduced falls risk and safe ability toc complete STS and stand to sit transfer without need for DME. Baseline: 07/14/22: LLE TDWB using RW at 13.95 seconds; 09/17/22: 5.65sec no AD Goal status:MET  3.  Pt will improve 5xSTS in < 12 seconds with LLE WBAT and no UE support to demo significant improvement in LE strength and reduced falls risk. Baseline: 07/14/22: 11.33 seconds maintaining LLE TDWB and BUE support on arm rests; 09/17/22: 7.54sec Goal status: MET  4.  Pt will improve 10 meter gait speed to >/= 1.2 m/s with LRAD to demo ability to ambulate at safe velocity to return to mod-I completion of community walking tasks.  Baseline: 07/14/22: LLE TDWB using RW completing at .6 m/s; 09/17/22: 1.79 m/s Goal status: MET   5.  Pt will be able to asc/desc full flight of stairs with reciprocal pattern without rails to return to complete independence with accessing second story of parent's home to access his bedroom. Baseline: 07/14/22: unable to complete due to WB status 09/17/22: Able to ascend/ descend stairs x3 w/o hand rail support.  Goal status: MET  6. Pt will be able to improve LLE leg press to be =/> the RLE to demonstrate improvement in LLE strength to improve functional activities needed for work and in the home.   Baseline: 09/17/22- RLE- 105#/LLE- 65# Goal status: INITIAL     PLAN:  PT FREQUENCY: 1-2x/week  PT DURATION: 12 weeks  PLANNED INTERVENTIONS: Therapeutic exercises, Therapeutic activity, Neuromuscular re-education, Balance training, Gait training, Patient/Family education, Self Care, Joint mobilization, Stair training, Vestibular training, Canalith repositioning, Orthotic/Fit training, DME  instructions, Dry Needling, Electrical stimulation, Spinal mobilization, Cryotherapy, Moist heat, scar mobilization, Manual therapy, and Re-evaluation  PLAN FOR NEXT SESSION: Progress LLE strength/mobility and SLS balance activities.   Lovie Macadamia, SPT  Delphia Grates. Fairly IV, PT, DPT Physical Therapist- [place]  [facility]

## 2022-09-29 NOTE — Telephone Encounter (Signed)
Caller Casimiro Needle) wants to know if Dr. Servando Salina will clear this patient to enter a detox program.  Caller stated patient is requesting to get this as soon as possible.

## 2022-09-29 NOTE — Telephone Encounter (Signed)
Call back to caller and answers as Casimiro Needle. He is Looking to see if patient can be cleared for a detox center .  30 day detox residential program in [place]. Advised to send a form noting facility as we do not know who he is or where he is calling from.  He will fax information

## 2022-09-30 DIAGNOSIS — F331 Major depressive disorder, recurrent, moderate: Secondary | ICD-10-CM | POA: Diagnosis not present

## 2022-10-01 ENCOUNTER — Ambulatory Visit: Payer: BC Managed Care – PPO

## 2022-10-01 DIAGNOSIS — R262 Difficulty in walking, not elsewhere classified: Secondary | ICD-10-CM

## 2022-10-01 DIAGNOSIS — M25552 Pain in left hip: Secondary | ICD-10-CM

## 2022-10-01 DIAGNOSIS — M6281 Muscle weakness (generalized): Secondary | ICD-10-CM | POA: Diagnosis not present

## 2022-10-01 NOTE — Therapy (Addendum)
OUTPATIENT PHYSICAL THERAPY LOWER EXTREMITY TREATMENT   Patient Name: Alfonzia Maitre MRN: [id number] DOB:Jun 12, 1975, 47 y.o., male Today's Date: 10/01/2022   END OF SESSION:  PT End of Session - 10/01/22 0900     Visit Number 14    Number of Visits 25    Date for PT Re-Evaluation 10/06/22    PT Start Time 0815    PT Stop Time 0845    PT Time Calculation (min) 30 min    Equipment Utilized During Treatment Gait belt    Activity Tolerance Patient tolerated treatment well    Behavior During Therapy WFL for tasks assessed/performed                Past Medical History:  Diagnosis Date   Acetabulum fracture, left (HCC) 06/30/2022   Alcohol use disorder, severe, in early remission (HCC) 03/30/2018   Allergy    Anxiety    Bipolar disorder, in partial remission, most recent episode hypomanic (HCC) 03/30/2018   [facility] Dr. [facility]a [facility]   Bleeding internal hemorrhoids 09/12/2013   Cannabis dependence (HCC) 07/13/2018   Daily use   Cervical disc disease 11/20/2014   Chronic anxiety 07/13/2018   Chronic systolic (congestive) heart failure (HCC)    Complication of anesthesia    Depression    Dyslipidemia    GERD (gastroesophageal reflux disease)    Hernia, inguinal, bilateral 02/24/2011   High ankle sprain of right lower extremity 01/16/2015   History of hiatal hernia    Hx of hepatitis C 10/05/2017   -treated in 2019 -hepatology recommended no further surveillance needed except for LFTs with labs and see hepatologist if elevated   Hypertension    Lipids abnormal 09/29/2013   Pneumonia    PONV (postoperative nausea and vomiting)    Vitamin D deficiency 07/01/2022   Past Surgical History:  Procedure Laterality Date   ANTERIOR CRUCIATE LIGAMENT REPAIR  2010   BIOPSY  03/13/2019   Procedure: BIOPSY;  Surgeon: Benancio Deeds, MD;  Location: [facility];  Service: Gastroenterology;;   COLONOSCOPY N/A 03/13/2019   per Dr. Adela Lank, adenomatous  polyps, repeat in 3 yrs   INGUINAL HERNIA REPAIR  02/23/2012   Procedure: LAPAROSCOPIC BILATERAL INGUINAL HERNIA REPAIR;  Surgeon: Kandis Cocking, MD;  Location: [facility];  Service: General;  Laterality: Bilateral;  Laparoscopic Bilateral Inguinal Hernia Repair with mesh   INSERTION OF MESH  02/23/2012   Procedure: INSERTION OF MESH;  Surgeon: Kandis Cocking, MD;  Location: [facility];  Service: General;  Laterality: N/A;   NOSE SURGERY  [id number]   ORIF ACETABULAR FRACTURE Left 06/30/2022   Procedure: OPEN REDUCTION INTERNAL FIXATION (ORIF) ACETABULAR FRACTURE;  Surgeon: Myrene Galas, MD;  Location: [facility];  Service: Orthopedics;  Laterality: Left;   POLYPECTOMY  03/13/2019   Procedure: POLYPECTOMY;  Surgeon: Benancio Deeds, MD;  Location: [facility];  Service: Gastroenterology;;   Patient Active Problem List   Diagnosis Date Noted   Abnormal LFTs 07/08/2022   Left foot drop 07/01/2022   Vitamin D deficiency 07/01/2022   Acetabulum fracture, left (HCC) 06/30/2022   Alcohol dependence (HCC) 01/05/2022   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   Alcohol-induced acute pancreatitis without infection or necrosis 12/05/2021   Genetic testing 04/21/2019   Benign neoplasm of colon    Loose stools    History of colonic polyps 03/07/2019   Cardiac left ventricular ejection fraction 30-35 percent 03/07/2019   Alcoholic ketoacidosis 11/18/2018   Dyslipidemia 09/16/2018   Fever blister  09/16/2018   Cardiomyopathy, alcoholic (HCC) 09/16/2018   Left bundle branch block (LBBB) 09/16/2018   Cannabis dependence (HCC) 07/13/2018   Chronic anxiety 07/13/2018   Bipolar 1 disorder, depressed, full remission (HCC) 03/30/2018   Cervical disc disease 11/20/2014   Hypertension 02/24/2011    PCP: Nelwyn Salisbury, MD  REFERRING PROVIDER: Myrene Galas, MD  REFERRING DIAG: S72.009A (ICD-10-CM) - Hip fracture [facility])  THERAPY DIAG:  Muscle weakness (generalized)  Pain in left  hip  Difficulty in walking, not elsewhere classified  Rationale for Evaluation and Treatment: Rehabilitation  ONSET DATE: 06/30/22   SUBJECTIVE:   SUBJECTIVE STATEMENT:  Pt reports 1/10 NPS at L foot. Pt addresses concerns of L great toe pain w/ ext and global allodynia of the L foot. Pt reports "feeling off" this morning. Reporting he has not taken his cardiac meds prior to his appointment.   PERTINENT HISTORY: Pt is a pleasant 47 y.o. male s/p L acetabular fracture repair and hip dislocation after fall walking dog down stairs. Pt with subsequent LLE foot drop still since operation on 06/30/22. Reports managing well at home, just wants to be able to learn how to asc/desc steps and return to independence. Currently unemployed but has a job lined up for when he recovers that will require regular standing/walking, helping people perform ADL's. Denies falls since being home. Worst pain 8/10 NPS in L hip at surgical site. Reports burning pain along deep peroneal nerve distribution starting at fibular head traveling along lateral aspect of lower leg to dorsal part of foot. Pt with hypersensitivity to light touch and tenderness at fibular head.   PAIN:  Are you having pain? Yes: NPRS scale: 1/10 Pain location: L ankle/foot Pain description: burning in foot,  Aggravating factors: some LLE mobility but tolerable, touching L foot.  Relieving factors: Medication,    PRECAUTIONS: None, posterior hip precautions cleared by MD 09/17/22  WEIGHT BEARING RESTRICTIONS: Yes WBAT weaning to no crutches (08/21/22) , post hip precautions 12 weeks from 06/30/22  FALLS:  Has patient fallen in last 6 months? No  LIVING ENVIRONMENT: Lives with: lives with their family Lives in: House/apartment Stairs: Yes: Internal: flight steps; on right going up and External: 1 steps; none Has following equipment at home: Walker - 2 wheeled and bed side commode  OCCUPATION: unemployed before injury. 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 Reports needing to be able to be able to walk, help with group home ADL's.   PLOF: Independent  PATIENT GOALS: Return to independent  NEXT MD VISIT: PCP, 07/20/22 and Dr. Carola Frost on 07/27/22  OBJECTIVE:   DIAGNOSTIC FINDINGS:  CLINICAL DATA:  Postoperative status.   EXAM: DG HIP (WITH OR WITHOUT PELVIS) 1V*L*   COMPARISON:  CT scan of same day.   FINDINGS: No dislocation is noted. There appears to be improved alignment involving the posterior acetabular fracture noted on prior CT scan. No other bony abnormality is noted.   IMPRESSION: No dislocation is noted. Probable improved alignment involving posterior acetabular fracture on the left.     Electronically Signed   By: Lupita Raider M.D.  PATIENT SURVEYS:  FOTO Deferred to next session  COGNITION: Overall cognitive status: Within functional limits for tasks assessed     SENSATION: Light touch: Impaired  L4-L5 on L foot (great toe, medial malleolus)    POSTURE: No Significant postural limitations  PALPATION: TTP along LLE fibular head along deep peroneal nerve to dorsum of foot  LOWER EXTREMITY ROM:  Active ROM Right eval Left eval  Hip flexion  90  Hip extension    Hip abduction    Hip adduction    Hip internal rotation    Hip external rotation 40 28  Knee flexion    Knee extension 0 0  Ankle dorsiflexion 10 Unable due to L foot drop  Ankle plantarflexion 55 42  Ankle inversion 35 20  Ankle eversion 20 unable   (Blank rows = not tested)   LLE PROM ankle DF/gastroc muscle length: 82 degrees  LOWER EXTREMITY MMT:  MMT Right eval Left eval  Hip flexion 5 5  Hip extension    Hip abduction 5 4  Hip adduction 5 5  Hip internal rotation    Hip external rotation    Knee flexion 5 5  Knee extension 5 5  Ankle dorsiflexion 5 0  Ankle plantarflexion 5 5  Ankle inversion 5 4  Ankle eversion 5 0   (Blank rows = not tested)  LOWER EXTREMITY SPECIAL TESTS:   N/A  FUNCTIONAL TESTS: 09/17/22 5 times sit to stand: 7.54 seconds w/ no AD, L AFO Timed up and go (TUG): 5.65 seconds w/ no AD, L AFO 10 meter walk test: 1.79 m/s w/ no AD, L AFO  GAIT: Distance walked: 10 meters Assistive device utilized: Walker - 2 wheeled Level of assistance: Modified independence Comments: LLE TTWB using step to pattern with RW.   TODAY'S TREATMENT: DATE: 10/01/22   Therapeutic Exercises:  RLE CKC ankle DF mobilization with movement with mob belt at talocrural joint: x5 w/ 20 sec hold   Sled pushes w/ 90# x2 laps, 175' total  Single leg, LLE leg press 45#, 1x8  BOSU ball upside down standing balance x 30 sec w/ bilat UE' s   Session ended 15 minutes early 2/2 pt not feeling well (see below)   PATIENT EDUCATION:  Education details: Education on importance of L ankle mobility in case pt will need AFO in the future for L foot drop to prevent gastrocnemius contracture. Reviewed post hip precautions and TDWB precautions and importance to comply for tissue healing.  Person educated: Patient Education method: Medical illustrator Education comprehension: verbalized understanding, returned demonstration, and needs further education  HOME EXERCISE PROGRAM: Access Code: [email or web address] URL: [email or web address] Date: 09/24/2022 Prepared by: Ronnie Derby  Exercises - Supine Hamstring Stretch with Strap  - 1 x daily - 7 x weekly - 1 sets - 3 reps - 30 hold - Clamshell  - 1 x daily - 3-4 x weekly - 3 sets - 8 reps - Mini Squat with Counter Support  - 1 x daily - 3-4 x weekly - 3 sets - 8 reps - Lunge with Counter Support  - 1 x daily - 3-4 x weekly - 3 sets - 8 reps - Standing Hip Abduction with Counter Support  - 1 x daily - 3-4 x weekly - 3 sets - 8 reps - Standing Hip Extension with Counter Support  - 1 x daily - 3-4 x weekly - 3 sets - 8 reps  ASSESSMENT:  CLINICAL IMPRESSION: Session focused on LE strengthening and balance  activities. Pt notes improvements in functional strength with ability to negotiate sled pushes w/ 90# weights at today's session. Pt reported "light headed" sx following this exercise and vitals were assessed 121/83 mm Hg, 115 BPM for HR, 97% SPO2. Pt continued to express these concerns with following exercises and decided to end his session 15 minutes early  today. Will follow up with pt via phone call later this afternoon to check in on sx later in the day. Pt would continue to benefit from skilled PT interventions to address L hip and foot/ankle DF strength deficits to return to PLOF and improve QoL.  OBJECTIVE IMPAIRMENTS: Abnormal gait, decreased activity tolerance, decreased balance, decreased mobility, difficulty walking, decreased ROM, decreased strength, impaired flexibility, impaired sensation, and pain.   ACTIVITY LIMITATIONS: carrying, lifting, bending, sitting, standing, squatting, sleeping, stairs, bathing, toileting, dressing, locomotion level, and caring for others  PARTICIPATION LIMITATIONS: cleaning, interpersonal relationship, driving, shopping, community activity, occupation, and yard work  PERSONAL FACTORS: Age, Fitness, Past/current experiences, Profession, Time since onset of injury/illness/exacerbation, and 3+ comorbidities: Alcohol use disorder (in early remission), CHF, cardiomyopathy, depression, anxiety  are also affecting patient's functional outcome.   REHAB POTENTIAL: Fair LLE foot drop, visit limit cap of 30 visits, multiple WB and hip precautions  CLINICAL DECISION MAKING: Evolving/moderate complexity  EVALUATION COMPLEXITY: Moderate   GOALS: Goals reviewed with patient? No  SHORT TERM GOALS: Target date: 08/25/22 Pt will be independent with HEP to improve L hip AROM and LLE strength to return to normalized gait mechanics with LRAD. Baseline: 07/14/22: HEP provided/discussed; 08/25/22: Performing as prescribed. Goal status: MET  2.  Pt will be able to ambulate >  500' with LRAD once WBAT, to complete household and short community distances.  Baseline: 07/14/22: TDWB on LLE, using RW only completing household distances. 09/17/22: Able to ambulate community distances  Goal status: MET   LONG TERM GOALS: Target date: 10/06/22  Pt will improve FOTO to target score to demonstrate clinically significant improvement in functional mobility. Baseline: 07/14/22: 38/67; 09/17/22: 60/67 Goal status: ONGOING  2.  Pt will improve TUG to < 12 sec with no AD to demo reduced falls risk and safe ability toc complete STS and stand to sit transfer without need for DME. Baseline: 07/14/22: LLE TDWB using RW at 13.95 seconds; 09/17/22: 5.65sec no AD Goal status:MET  3.  Pt will improve 5xSTS in < 12 seconds with LLE WBAT and no UE support to demo significant improvement in LE strength and reduced falls risk. Baseline: 07/14/22: 11.33 seconds maintaining LLE TDWB and BUE support on arm rests; 09/17/22: 7.54sec Goal status: MET  4.  Pt will improve 10 meter gait speed to >/= 1.2 m/s with LRAD to demo ability to ambulate at safe velocity to return to mod-I completion of community walking tasks.  Baseline: 07/14/22: LLE TDWB using RW completing at .6 m/s; 09/17/22: 1.79 m/s Goal status: MET   5.  Pt will be able to asc/desc full flight of stairs with reciprocal pattern without rails to return to complete independence with accessing second story of parent's home to access his bedroom. Baseline: 07/14/22: unable to complete due to WB status 09/17/22: Able to ascend/ descend stairs x3 w/o hand rail support.  Goal status: MET  6. Pt will be able to improve LLE leg press to be =/> the RLE to demonstrate improvement in LLE strength to improve functional activities needed for work and in the home.  Baseline: 09/17/22- RLE- 105#/LLE- 65# Goal status: INITIAL     PLAN:  PT FREQUENCY: 1-2x/week  PT DURATION: 12 weeks  PLANNED INTERVENTIONS: Therapeutic exercises, Therapeutic activity,  Neuromuscular re-education, Balance training, Gait training, Patient/Family education, Self Care, Joint mobilization, Stair training, Vestibular training, Canalith repositioning, Orthotic/Fit training, DME instructions, Dry Needling, Electrical stimulation, Spinal mobilization, Cryotherapy, Moist heat, scar mobilization, Manual therapy, and Re-evaluation  PLAN  FOR NEXT SESSION: Progress LLE strength/mobility and SLS balance activities  Lovie Macadamia, SPT  Delphia Grates. Fairly IV, PT, DPT Physical Therapist- [place]  [facility]

## 2022-10-01 NOTE — Telephone Encounter (Signed)
Patient called to request clearance to attend treatment program be sent to him.

## 2022-10-04 ENCOUNTER — Other Ambulatory Visit: Payer: Self-pay | Admitting: Family Medicine

## 2022-10-04 DIAGNOSIS — E7800 Pure hypercholesterolemia, unspecified: Secondary | ICD-10-CM

## 2022-10-06 NOTE — Telephone Encounter (Signed)
Pt would like a callback regarding MyChart message about Cardiologist in [place]. Please advise

## 2022-10-06 NOTE — Telephone Encounter (Signed)
Mychart message sent to patient.

## 2022-10-06 NOTE — Telephone Encounter (Signed)
Returned call to pt. He is going to a 30 day inpatient program. He needs clearance from Dr. Servando Salina to be able to attend. He does not have the green light to go because he does not have clearance. They are paying for him to come out and do this duel treatment for mental health and drinking. Pt has called [place] and the cardiologist there states they can not clear him due to him being established with [name] and it is only a 30 day treatment. Please advise.

## 2022-10-07 ENCOUNTER — Ambulatory Visit: Payer: BC Managed Care – PPO | Attending: Orthopedic Surgery

## 2022-10-07 DIAGNOSIS — M25552 Pain in left hip: Secondary | ICD-10-CM | POA: Insufficient documentation

## 2022-10-07 DIAGNOSIS — R262 Difficulty in walking, not elsewhere classified: Secondary | ICD-10-CM | POA: Insufficient documentation

## 2022-10-07 DIAGNOSIS — M6281 Muscle weakness (generalized): Secondary | ICD-10-CM | POA: Diagnosis not present

## 2022-10-07 DIAGNOSIS — F331 Major depressive disorder, recurrent, moderate: Secondary | ICD-10-CM | POA: Diagnosis not present

## 2022-10-07 NOTE — Telephone Encounter (Signed)
Returned patient call with Dr. Barnetta Hammersmith recommendation. Pt is going to call and find a cardiologist in [place]. Pt will call back with name and date.   Per Dr. Servando Salina  Thanks for the update. I need a name of a cardiologist in [place] he will follow and an appointment date for a cardiologist in [place] before I clear him. This will ensure transition of care while he is in [place]. He has severe depressed ejection fraction that he need continue cardiovascular care.

## 2022-10-07 NOTE — Telephone Encounter (Signed)
Dr. Servando Salina called the pt. See message below from 10/02/2022:   I called to speak to the patient about his echo result as well as to discuss the request for clearance to go to a rehab center.  Unfortunately he did not answer the phone call.  I left a message clarifying that he will need to set up an establishing connection with a cardiologist in [place] due to  his dilated cardiomyopathy.

## 2022-10-07 NOTE — Therapy (Addendum)
OUTPATIENT PHYSICAL THERAPY LOWER EXTREMITY TREATMENT   Patient Name: Thomas Williamson MRN: [id number] DOB:August 19, 1975, 47 y.o., male Today's Date: 10/07/2022   END OF SESSION:  PT End of Session - 10/07/22 1345     Visit Number 15    Number of Visits 25    Date for PT Re-Evaluation 11/18/22    PT Start Time 1345    PT Stop Time 1429    PT Time Calculation (min) 44 min    Equipment Utilized During Treatment Gait belt    Activity Tolerance Patient tolerated treatment well    Behavior During Therapy WFL for tasks assessed/performed                Past Medical History:  Diagnosis Date   Acetabulum fracture, left (HCC) 06/30/2022   Alcohol use disorder, severe, in early remission (HCC) 03/30/2018   Allergy    Anxiety    Bipolar disorder, in partial remission, most recent episode hypomanic (HCC) 03/30/2018   [facility] Dr[facility]a [facility]   Bleeding internal hemorrhoids 09/12/2013   Cannabis dependence (HCC) 07/13/2018   Daily use   Cervical disc disease 11/20/2014   Chronic anxiety 07/13/2018   Chronic systolic (congestive) heart failure (HCC)    Complication of anesthesia    Depression    Dyslipidemia    GERD (gastroesophageal reflux disease)    Hernia, inguinal, bilateral 02/24/2011   High ankle sprain of right lower extremity 01/16/2015   History of hiatal hernia    Hx of hepatitis C 10/05/2017   -treated in 2019 -hepatology recommended no further surveillance needed except for LFTs with labs and see hepatologist if elevated   Hypertension    Lipids abnormal 09/29/2013   Pneumonia    PONV (postoperative nausea and vomiting)    Vitamin D deficiency 07/01/2022   Past Surgical History:  Procedure Laterality Date   ANTERIOR CRUCIATE LIGAMENT REPAIR  2010   BIOPSY  03/13/2019   Procedure: BIOPSY;  Surgeon: Benancio Deeds, MD;  Location: [facility];  Service: Gastroenterology;;   COLONOSCOPY N/A 03/13/2019   per Dr. Adela Lank, adenomatous  polyps, repeat in 3 yrs   INGUINAL HERNIA REPAIR  02/23/2012   Procedure: LAPAROSCOPIC BILATERAL INGUINAL HERNIA REPAIR;  Surgeon: Kandis Cocking, MD;  Location: [facility];  Service: General;  Laterality: Bilateral;  Laparoscopic Bilateral Inguinal Hernia Repair with mesh   INSERTION OF MESH  02/23/2012   Procedure: INSERTION OF MESH;  Surgeon: Kandis Cocking, MD;  Location: [facility];  Service: General;  Laterality: N/A;   NOSE SURGERY  [id number]   ORIF ACETABULAR FRACTURE Left 06/30/2022   Procedure: OPEN REDUCTION INTERNAL FIXATION (ORIF) ACETABULAR FRACTURE;  Surgeon: Myrene Galas, MD;  Location: [facility];  Service: Orthopedics;  Laterality: Left;   POLYPECTOMY  03/13/2019   Procedure: POLYPECTOMY;  Surgeon: Benancio Deeds, MD;  Location: [facility];  Service: Gastroenterology;;   Patient Active Problem List   Diagnosis Date Noted   Abnormal LFTs 07/08/2022   Left foot drop 07/01/2022   Vitamin D deficiency 07/01/2022   Acetabulum fracture, left (HCC) 06/30/2022   Alcohol dependence (HCC) 01/05/2022   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   Alcohol-induced acute pancreatitis without infection or necrosis 12/05/2021   Genetic testing 04/21/2019   Benign neoplasm of colon    Loose stools    History of colonic polyps 03/07/2019   Cardiac left ventricular ejection fraction 30-35 percent 03/07/2019   Alcoholic ketoacidosis 11/18/2018   Dyslipidemia 09/16/2018   Fever blister  09/16/2018   Cardiomyopathy, alcoholic (HCC) 09/16/2018   Left bundle branch block (LBBB) 09/16/2018   Cannabis dependence (HCC) 07/13/2018   Chronic anxiety 07/13/2018   Bipolar 1 disorder, depressed, full remission (HCC) 03/30/2018   Cervical disc disease 11/20/2014   Hypertension 02/24/2011    PCP: Nelwyn Salisbury, MD  REFERRING PROVIDER: Myrene Galas, MD  REFERRING DIAG: S72.009A (ICD-10-CM) - Hip fracture [facility])  THERAPY DIAG:  Muscle weakness (generalized)  Pain in left  hip  Difficulty in walking, not elsewhere classified  Rationale for Evaluation and Treatment: Rehabilitation  ONSET DATE: 06/30/22   SUBJECTIVE:   SUBJECTIVE STATEMENT:  Pt reports 5/10 NPS at L knee after "tweaking" his knee after tripping over L foot while not wearing AFO. Pt also addresses continued L great toe pain and stiffness and difficulty curling great toe.   PERTINENT HISTORY: Pt is a pleasant 47 y.o. male s/p L acetabular fracture repair and hip dislocation after fall walking dog down stairs. Pt with subsequent LLE foot drop still since operation on 06/30/22. Reports managing well at home, just wants to be able to learn how to asc/desc steps and return to independence. Currently unemployed but has a job lined up for when he recovers that will require regular standing/walking, helping people perform ADL's. Denies falls since being home. Worst pain 8/10 NPS in L hip at surgical site. Reports burning pain along deep peroneal nerve distribution starting at fibular head traveling along lateral aspect of lower leg to dorsal part of foot. Pt with hypersensitivity to light touch and tenderness at fibular head.   PAIN:  Are you having pain? Yes: NPRS scale: 1/10 Pain location: L ankle/foot Pain description: burning in foot,  Aggravating factors: some LLE mobility but tolerable, touching L foot.  Relieving factors: Medication,    PRECAUTIONS: None, posterior hip precautions cleared by MD 09/17/22  WEIGHT BEARING RESTRICTIONS: Yes WBAT weaning to no crutches (08/21/22) , post hip precautions 12 weeks from 06/30/22  FALLS:  Has patient fallen in last 6 months? No  LIVING ENVIRONMENT: Lives with: lives with their family Lives in: House/apartment Stairs: Yes: Internal: flight steps; on right going up and External: 1 steps; none Has following equipment at home: Walker - 2 wheeled and bed side commode  OCCUPATION: unemployed before injury. 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 Reports needing to be able to be able to walk, help with group home ADL's.   PLOF: Independent  PATIENT GOALS: Return to independent  NEXT MD VISIT: PCP, 07/20/22 and Dr. Carola Frost on 07/27/22  OBJECTIVE:   DIAGNOSTIC FINDINGS:  CLINICAL DATA:  Postoperative status.   EXAM: DG HIP (WITH OR WITHOUT PELVIS) 1V*L*   COMPARISON:  CT scan of same day.   FINDINGS: No dislocation is noted. There appears to be improved alignment involving the posterior acetabular fracture noted on prior CT scan. No other bony abnormality is noted.   IMPRESSION: No dislocation is noted. Probable improved alignment involving posterior acetabular fracture on the left.     Electronically Signed   By: Lupita Raider M.D.  PATIENT SURVEYS:  FOTO Deferred to next session  COGNITION: Overall cognitive status: Within functional limits for tasks assessed     SENSATION: Light touch: Impaired  L4-L5 on L foot (great toe, medial malleolus)    POSTURE: No Significant postural limitations  PALPATION: TTP along LLE fibular head along deep peroneal nerve to dorsum of foot  LOWER EXTREMITY ROM:  Active ROM  Right eval Left eval  Hip flexion  90  Hip extension    Hip abduction    Hip adduction    Hip internal rotation    Hip external rotation 40 28  Knee flexion    Knee extension 0 0  Ankle dorsiflexion 10 Unable due to L foot drop  Ankle plantarflexion 55 42  Ankle inversion 35 20  Ankle eversion 20 unable   (Blank rows = not tested)   LLE PROM ankle DF/gastroc muscle length: 82 degrees  LOWER EXTREMITY MMT:  MMT Right eval Left eval  Hip flexion 5 5  Hip extension    Hip abduction 5 4  Hip adduction 5 5  Hip internal rotation    Hip external rotation    Knee flexion 5 5  Knee extension 5 5  Ankle dorsiflexion 5 0  Ankle plantarflexion 5 5  Ankle inversion 5 4  Ankle eversion 5 0   (Blank rows = not tested)  LOWER EXTREMITY SPECIAL TESTS:  N/A  FUNCTIONAL TESTS:  09/17/22 5 times sit to stand: 7.54 seconds w/ no AD, L AFO Timed up and go (TUG): 5.65 seconds w/ no AD, L AFO 10 meter walk test: 1.79 m/s w/ no AD, L AFO  GAIT: Distance walked: 10 meters Assistive device utilized: Walker - 2 wheeled Level of assistance: Modified independence Comments: LLE TTWB using step to pattern with RW.   TODAY'S TREATMENT: DATE: 10/07/22  Beginning of session spent reassessing pt's goals and POC to complete re-cert. (See below)   Therapeutic Exercises: Seated L great toe ext slides x10  LLE single leg, leg press 35# x8, 25# 2 x10  LLE single leg, knee ext 15# 3 x8  LLE single leg, knee flexion 25# 2 x8   PATIENT EDUCATION:  Education details: Education on importance of L ankle mobility in case pt will need AFO in the future for L foot drop to prevent gastrocnemius contracture. Reviewed post hip precautions and TDWB precautions and importance to comply for tissue healing.  Person educated: Patient Education method: Medical illustrator Education comprehension: verbalized understanding, returned demonstration, and needs further education  HOME EXERCISE PROGRAM: Access Code: [email or web address] URL: [email or web address] Date: 09/24/2022 Prepared by: Ronnie Derby  Exercises - Supine Hamstring Stretch with Strap  - 1 x daily - 7 x weekly - 1 sets - 3 reps - 30 hold - Clamshell  - 1 x daily - 3-4 x weekly - 3 sets - 8 reps - Mini Squat with Counter Support  - 1 x daily - 3-4 x weekly - 3 sets - 8 reps - Lunge with Counter Support  - 1 x daily - 3-4 x weekly - 3 sets - 8 reps - Standing Hip Abduction with Counter Support  - 1 x daily - 3-4 x weekly - 3 sets - 8 reps - Standing Hip Extension with Counter Support  - 1 x daily - 3-4 x weekly - 3 sets - 8 reps  ASSESSMENT:  CLINICAL IMPRESSION: Session focused on assessing objective functional measures, to update pt's LTG's as pt requires re-certification to continue PT. Pt's FOTO score was a  59/67, noting that pt continues to have LLE deficits that limit his functional abilities. Pt's was assessed at today's session noting 1443ft covered. The average distance covered by males of this pt's age range is 2,445ft, displaying that pt continues to note functional weakness and decreased endurance 2/2 to LLE injury. Pt would continue to benefit from skilled PT interventions for  1-2x/ week for an additional six weeks to address remaining LLE strength and pain deficits.   OBJECTIVE IMPAIRMENTS: Abnormal gait, decreased activity tolerance, decreased balance, decreased mobility, difficulty walking, decreased ROM, decreased strength, impaired flexibility, impaired sensation, and pain.   ACTIVITY LIMITATIONS: carrying, lifting, bending, sitting, standing, squatting, sleeping, stairs, bathing, toileting, dressing, locomotion level, and caring for others  PARTICIPATION LIMITATIONS: cleaning, interpersonal relationship, driving, shopping, community activity, occupation, and yard work  PERSONAL FACTORS: Age, Fitness, Past/current experiences, Profession, Time since onset of injury/illness/exacerbation, and 3+ comorbidities: Alcohol use disorder (in early remission), CHF, cardiomyopathy, depression, anxiety  are also affecting patient's functional outcome.   REHAB POTENTIAL: Fair LLE foot drop, visit limit cap of 30 visits, multiple WB and hip precautions  CLINICAL DECISION MAKING: Evolving/moderate complexity  EVALUATION COMPLEXITY: Moderate   GOALS: Goals reviewed with patient? No  SHORT TERM GOALS: Target date: 08/25/22 Pt will be independent with HEP to improve L hip AROM and LLE strength to return to normalized gait mechanics with LRAD. Baseline: 07/14/22: HEP provided/discussed; 08/25/22: Performing as prescribed. Goal status: MET  2.  Pt will be able to ambulate > 500' with LRAD once WBAT, to complete household and short community distances.  Baseline: 07/14/22: TDWB on LLE, using RW only  completing household distances. 09/17/22: Able to ambulate community distances  Goal status: MET   LONG TERM GOALS: Target date: 11/18/22  Pt will improve FOTO to target score to demonstrate clinically significant improvement in functional mobility. Baseline: 07/14/22: 38/67; 09/17/22: 60/67; 10/07/22: 59/67 Goal status: ONGOING  2.  Pt will improve TUG to < 12 sec with no AD to demo reduced falls risk and safe ability toc complete STS and stand to sit transfer without need for DME. Baseline: 07/14/22: LLE TDWB using RW at 13.95 seconds; 09/17/22: 5.65sec no AD Goal status:MET  3.  Pt will improve 5xSTS in < 12 seconds with LLE WBAT and no UE support to demo significant improvement in LE strength and reduced falls risk. Baseline: 07/14/22: 11.33 seconds maintaining LLE TDWB and BUE support on arm rests; 09/17/22: 7.54sec Goal status: MET  4.  Pt will improve 10 meter gait speed to >/= 1.2 m/s with LRAD to demo ability to ambulate at safe velocity to return to mod-I completion of community walking tasks.  Baseline: 07/14/22: LLE TDWB using RW completing at .6 m/s; 09/17/22: 1.79 m/s Goal status: MET   5.  Pt will be able to asc/desc full flight of stairs with reciprocal pattern without rails to return to complete independence with accessing second story of parent's home to access his bedroom. Baseline: 07/14/22: unable to complete due to WB status 09/17/22: Able to ascend/ descend stairs x3 w/o hand rail support.  Goal status: MET  6. Pt will be able to improve LLE leg press to be =/> the RLE to demonstrate improvement in LLE strength to improve functional activities needed for work and in the home.  Baseline: 09/17/22- RLE- 105#/LLE- 65# 10/07/22: Defer to next session  Goal status: ONGOING  7. Pt will increase 6 MWT by > 165' to display improvements in functional endurance with community ambulation.  Baseline: 10/07/22: 1484ft Goal status: INITIAL   PLAN:  PT FREQUENCY: 1-2x/week  PT  DURATION: 6 weeks  PLANNED INTERVENTIONS: Therapeutic exercises, Therapeutic activity, Neuromuscular re-education, Balance training, Gait training, Patient/Family education, Self Care, Joint mobilization, Stair training, Vestibular training, Canalith repositioning, Orthotic/Fit training, DME instructions, Dry Needling, Electrical stimulation, Spinal mobilization, Cryotherapy, Moist heat, scar mobilization, Manual therapy, and Re-evaluation  PLAN FOR NEXT SESSION: Assess LLE/RLE single leg leg press progress,  Progress LLE strength/mobility and SLS balance activities  Lovie Macadamia, SPT  Delphia Grates. Fairly IV, PT, DPT Physical Therapist- [place]  [facility]

## 2022-10-08 DIAGNOSIS — H5213 Myopia, bilateral: Secondary | ICD-10-CM | POA: Diagnosis not present

## 2022-10-09 ENCOUNTER — Ambulatory Visit: Payer: BC Managed Care – PPO

## 2022-10-09 ENCOUNTER — Telehealth: Payer: Self-pay | Admitting: Cardiology

## 2022-10-09 DIAGNOSIS — R262 Difficulty in walking, not elsewhere classified: Secondary | ICD-10-CM

## 2022-10-09 DIAGNOSIS — M25552 Pain in left hip: Secondary | ICD-10-CM

## 2022-10-09 DIAGNOSIS — M6281 Muscle weakness (generalized): Secondary | ICD-10-CM | POA: Diagnosis not present

## 2022-10-09 NOTE — Telephone Encounter (Signed)
Patient is calling back about appt in [place] with a cardiologist

## 2022-10-09 NOTE — Telephone Encounter (Signed)
Returned pt call. Pt states he has tried to make an appt but the office he calls says they need a referral or needs to speak with his current Cardiologist. He is try to get to rehab sooner rather than later as the facility is flying him out there and providing a 30 day session for free. Dr. Servando Salina will not clear him until he has this set up but he is having trouble with the office out there he states. He would like to know if Dr. Servando Salina or her nurse could make this happen?  The contact information is as below:  [facility] [address] [place], [place] [address]  Phone: [phone] Email:[email or web address]

## 2022-10-09 NOTE — Therapy (Addendum)
OUTPATIENT PHYSICAL THERAPY LOWER EXTREMITY TREATMENT   Patient Name: Thomas Williamson MRN: [id number] DOB:[birth year], 47 y.o., male Today's Date: 10/09/2022   END OF SESSION:  PT End of Session - 10/09/22 0857     Visit Number 16    Number of Visits 25    Date for PT Re-Evaluation 11/18/22    PT Start Time 0900    PT Stop Time 0943    PT Time Calculation (min) 43 min    Equipment Utilized During Treatment Gait belt    Activity Tolerance Patient tolerated treatment well    Behavior During Therapy WFL for tasks assessed/performed                Past Medical History:  Diagnosis Date   Acetabulum fracture, left (HCC) 06/30/2022   Alcohol use disorder, severe, in early remission (HCC) 03/30/2018   Allergy    Anxiety    Bipolar disorder, in partial remission, most recent episode hypomanic (HCC) 03/30/2018   [facility] Dr. [facility]a [facility]   Bleeding internal hemorrhoids 09/12/2013   Cannabis dependence (HCC) 07/13/2018   Daily use   Cervical disc disease 11/20/2014   Chronic anxiety 07/13/2018   Chronic systolic (congestive) heart failure (HCC)    Complication of anesthesia    Depression    Dyslipidemia    GERD (gastroesophageal reflux disease)    Hernia, inguinal, bilateral 02/24/2011   High ankle sprain of right lower extremity 01/16/2015   History of hiatal hernia    Hx of hepatitis C 10/05/2017   -treated in 2019 -hepatology recommended no further surveillance needed except for LFTs with labs and see hepatologist if elevated   Hypertension    Lipids abnormal 09/29/2013   Pneumonia    PONV (postoperative nausea and vomiting)    Vitamin D deficiency 07/01/2022   Past Surgical History:  Procedure Laterality Date   ANTERIOR CRUCIATE LIGAMENT REPAIR  2010   BIOPSY  03/13/2019   Procedure: BIOPSY;  Surgeon: Benancio Deeds, MD;  Location: [facility];  Service: Gastroenterology;;   COLONOSCOPY N/A 03/13/2019   per Dr. Adela Lank, adenomatous  polyps, repeat in 3 yrs   INGUINAL HERNIA REPAIR  02/23/2012   Procedure: LAPAROSCOPIC BILATERAL INGUINAL HERNIA REPAIR;  Surgeon: Kandis Cocking, MD;  Location: [facility];  Service: General;  Laterality: Bilateral;  Laparoscopic Bilateral Inguinal Hernia Repair with mesh   INSERTION OF MESH  02/23/2012   Procedure: INSERTION OF MESH;  Surgeon: Kandis Cocking, MD;  Location: [facility];  Service: General;  Laterality: N/A;   NOSE SURGERY  [id number]   ORIF ACETABULAR FRACTURE Left 06/30/2022   Procedure: OPEN REDUCTION INTERNAL FIXATION (ORIF) ACETABULAR FRACTURE;  Surgeon: Myrene Galas, MD;  Location: [facility];  Service: Orthopedics;  Laterality: Left;   POLYPECTOMY  03/13/2019   Procedure: POLYPECTOMY;  Surgeon: Benancio Deeds, MD;  Location: [facility];  Service: Gastroenterology;;   Patient Active Problem List   Diagnosis Date Noted   Abnormal LFTs 07/08/2022   Left foot drop 07/01/2022   Vitamin D deficiency 07/01/2022   Acetabulum fracture, left (HCC) 06/30/2022   Alcohol dependence (HCC) 01/05/2022   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   Alcohol-induced acute pancreatitis without infection or necrosis 12/05/2021   Genetic testing 04/21/2019   Benign neoplasm of colon    Loose stools    History of colonic polyps 03/07/2019   Cardiac left ventricular ejection fraction 30-35 percent 03/07/2019   Alcoholic ketoacidosis 11/18/2018   Dyslipidemia 09/16/2018   Fever blister  09/16/2018   Cardiomyopathy, alcoholic (HCC) 09/16/2018   Left bundle branch block (LBBB) 09/16/2018   Cannabis dependence (HCC) 07/13/2018   Chronic anxiety 07/13/2018   Bipolar 1 disorder, depressed, full remission (HCC) 03/30/2018   Cervical disc disease 11/20/2014   Hypertension 02/24/2011    PCP: Nelwyn Salisbury, MD  REFERRING PROVIDER: Myrene Galas, MD  REFERRING DIAG: S72.009A (ICD-10-CM) - Hip fracture [facility])  THERAPY DIAG:  Muscle weakness (generalized)  Pain in left  hip  Difficulty in walking, not elsewhere classified  Rationale for Evaluation and Treatment: Rehabilitation  ONSET DATE: 06/30/22   SUBJECTIVE:   SUBJECTIVE STATEMENT:  Pt reports 0/10 NPS. Pt continues to report L great toe pain and stiffness and no continued knee pain.   PERTINENT HISTORY: Pt is a pleasant 47 y.o. male s/p L acetabular fracture repair and hip dislocation after fall walking dog down stairs. Pt with subsequent LLE foot drop still since operation on 06/30/22. Reports managing well at home, just wants to be able to learn how to asc/desc steps and return to independence. Currently unemployed but has a job lined up for when he recovers that will require regular standing/walking, helping people perform ADL's. Denies falls since being home. Worst pain 8/10 NPS in L hip at surgical site. Reports burning pain along deep peroneal nerve distribution starting at fibular head traveling along lateral aspect of lower leg to dorsal part of foot. Pt with hypersensitivity to light touch and tenderness at fibular head.   PAIN:  Are you having pain? Yes: NPRS scale: 0/10 Pain location: L ankle/foot Pain description: burning in foot,  Aggravating factors: some LLE mobility but tolerable, touching L foot.  Relieving factors: Medication,    PRECAUTIONS: None, posterior hip precautions cleared by MD 09/17/22  WEIGHT BEARING RESTRICTIONS: Yes WBAT weaning to no crutches (08/21/22) , post hip precautions 12 weeks from 06/30/22  FALLS:  Has patient fallen in last 6 months? No  LIVING ENVIRONMENT: Lives with: lives with their family Lives in: House/apartment Stairs: Yes: Internal: flight steps; on right going up and External: 1 steps; none Has following equipment at home: Walker - 2 wheeled and bed side commode  OCCUPATION: unemployed before injury. 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 Reports needing to be able to be able to walk, help with group home ADL's.   PLOF:  Independent  PATIENT GOALS: Return to independent  NEXT MD VISIT: PCP, 07/20/22 and Dr. Carola Frost on 07/27/22  OBJECTIVE:   DIAGNOSTIC FINDINGS:  CLINICAL DATA:  Postoperative status.   EXAM: DG HIP (WITH OR WITHOUT PELVIS) 1V*L*   COMPARISON:  CT scan of same day.   FINDINGS: No dislocation is noted. There appears to be improved alignment involving the posterior acetabular fracture noted on prior CT scan. No other bony abnormality is noted.   IMPRESSION: No dislocation is noted. Probable improved alignment involving posterior acetabular fracture on the left.     Electronically Signed   By: Lupita Raider M.D.  PATIENT SURVEYS:  FOTO Deferred to next session  COGNITION: Overall cognitive status: Within functional limits for tasks assessed     SENSATION: Light touch: Impaired  L4-L5 on L foot (great toe, medial malleolus)    POSTURE: No Significant postural limitations  PALPATION: TTP along LLE fibular head along deep peroneal nerve to dorsum of foot  LOWER EXTREMITY ROM:  Active ROM Right eval Left eval  Hip flexion  90  Hip extension    Hip  abduction    Hip adduction    Hip internal rotation    Hip external rotation 40 28  Knee flexion    Knee extension 0 0  Ankle dorsiflexion 10 Unable due to L foot drop  Ankle plantarflexion 55 42  Ankle inversion 35 20  Ankle eversion 20 unable   (Blank rows = not tested)   LLE PROM ankle DF/gastroc muscle length: 82 degrees  LOWER EXTREMITY MMT:  MMT Right eval Left eval  Hip flexion 5 5  Hip extension    Hip abduction 5 4  Hip adduction 5 5  Hip internal rotation    Hip external rotation    Knee flexion 5 5  Knee extension 5 5  Ankle dorsiflexion 5 0  Ankle plantarflexion 5 5  Ankle inversion 5 4  Ankle eversion 5 0   (Blank rows = not tested)  LOWER EXTREMITY SPECIAL TESTS:  N/A  FUNCTIONAL TESTS: 09/17/22 5 times sit to stand: 7.54 seconds w/ no AD, L AFO Timed up and go (TUG): 5.65 seconds w/  no AD, L AFO 10 meter walk test: 1.79 m/s w/ no AD, L AFO  GAIT: Distance walked: 10 meters Assistive device utilized: Walker - 2 wheeled Level of assistance: Modified independence Comments: LLE TTWB using step to pattern with RW.   TODAY'S TREATMENT: DATE: 10/09/22  Walking w/ 20# kettlebell RUE/ LUE 2 x100'/ each direction  Lateral stepping w/ GTB 3 x8' down and back Alternating lunges forward w/ 5# weights in bilat UE 's w/ RLE/ LLE x10/ each side  Aerobic 4" step, lateral step up's RLE/LLE 2x 10/ each side with 5# DB Mini squats onto elevated plinth table RLE slightly forward/ LLE backward 2 x10, 2 KG med ball  LLE single leg, leg press 35# 2 x8   PATIENT EDUCATION:  Education details: Education on importance of L ankle mobility in case pt will need AFO in the future for L foot drop to prevent gastrocnemius contracture. Reviewed post hip precautions and TDWB precautions and importance to comply for tissue healing.  Person educated: Patient Education method: Medical illustrator Education comprehension: verbalized understanding, returned demonstration, and needs further education  HOME EXERCISE PROGRAM: Access Code: [email or web address] URL: [email or web address] Date: 09/24/2022 Prepared by: Ronnie Derby  Exercises - Supine Hamstring Stretch with Strap  - 1 x daily - 7 x weekly - 1 sets - 3 reps - 30 hold - Clamshell  - 1 x daily - 3-4 x weekly - 3 sets - 8 reps - Mini Squat with Counter Support  - 1 x daily - 3-4 x weekly - 3 sets - 8 reps - Lunge with Counter Support  - 1 x daily - 3-4 x weekly - 3 sets - 8 reps - Standing Hip Abduction with Counter Support  - 1 x daily - 3-4 x weekly - 3 sets - 8 reps - Standing Hip Extension with Counter Support  - 1 x daily - 3-4 x weekly - 3 sets - 8 reps  ASSESSMENT:  CLINICAL IMPRESSION: Session focused on progressing LLE strengthening exercises, emphasizing SLS activities. Pt displays improvements in LLE strength  and decreased pain levels noted w/ increased activity tolerance to exercises at today's session. Pt continues to note weakness in the LLE> RLE, displayed with increased LLE muscular fatigue following exercise with isolated single leg tasks. Pt tends to WB greater through the RLE displayed when completing mini squats, requiring staggered position change in feet (RLE forward/ LLE backward) to emphasize increased  LLE WB'ing. Pt would continue to benefit from skilled PT interventions to address remaining LLE strength deficits and return to PLOF.  OBJECTIVE IMPAIRMENTS: Abnormal gait, decreased activity tolerance, decreased balance, decreased mobility, difficulty walking, decreased ROM, decreased strength, impaired flexibility, impaired sensation, and pain.   ACTIVITY LIMITATIONS: carrying, lifting, bending, sitting, standing, squatting, sleeping, stairs, bathing, toileting, dressing, locomotion level, and caring for others  PARTICIPATION LIMITATIONS: cleaning, interpersonal relationship, driving, shopping, community activity, occupation, and yard work  PERSONAL FACTORS: Age, Fitness, Past/current experiences, Profession, Time since onset of injury/illness/exacerbation, and 3+ comorbidities: Alcohol use disorder (in early remission), CHF, cardiomyopathy, depression, anxiety  are also affecting patient's functional outcome.   REHAB POTENTIAL: Fair LLE foot drop, visit limit cap of 30 visits, multiple WB and hip precautions  CLINICAL DECISION MAKING: Evolving/moderate complexity  EVALUATION COMPLEXITY: Moderate   GOALS: Goals reviewed with patient? No  SHORT TERM GOALS: Target date: 08/25/22 Pt will be independent with HEP to improve L hip AROM and LLE strength to return to normalized gait mechanics with LRAD. Baseline: 07/14/22: HEP provided/discussed; 08/25/22: Performing as prescribed. Goal status: MET  2.  Pt will be able to ambulate > 500' with LRAD once WBAT, to complete household and short  community distances.  Baseline: 07/14/22: TDWB on LLE, using RW only completing household distances. 09/17/22: Able to ambulate community distances  Goal status: MET   LONG TERM GOALS: Target date: 11/18/22  Pt will improve FOTO to target score to demonstrate clinically significant improvement in functional mobility. Baseline: 07/14/22: 38/67; 09/17/22: 60/67; 10/07/22: 59/67 Goal status: ONGOING  2.  Pt will improve TUG to < 12 sec with no AD to demo reduced falls risk and safe ability toc complete STS and stand to sit transfer without need for DME. Baseline: 07/14/22: LLE TDWB using RW at 13.95 seconds; 09/17/22: 5.65sec no AD Goal status:MET  3.  Pt will improve 5xSTS in < 12 seconds with LLE WBAT and no UE support to demo significant improvement in LE strength and reduced falls risk. Baseline: 07/14/22: 11.33 seconds maintaining LLE TDWB and BUE support on arm rests; 09/17/22: 7.54sec Goal status: MET  4.  Pt will improve 10 meter gait speed to >/= 1.2 m/s with LRAD to demo ability to ambulate at safe velocity to return to mod-I completion of community walking tasks.  Baseline: 07/14/22: LLE TDWB using RW completing at .6 m/s; 09/17/22: 1.79 m/s Goal status: MET   5.  Pt will be able to asc/desc full flight of stairs with reciprocal pattern without rails to return to complete independence with accessing second story of parent's home to access his bedroom. Baseline: 07/14/22: unable to complete due to WB status 09/17/22: Able to ascend/ descend stairs x3 w/o hand rail support.  Goal status: MET  6. Pt will be able to improve LLE leg press to be =/> the RLE to demonstrate improvement in LLE strength to improve functional activities needed for work and in the home.  Baseline: 09/17/22- RLE- 105#/LLE- 65# 10/07/22: Defer to next session  Goal status: ONGOING  7. Pt will increase 6 MWT by > 165' to display improvements in functional endurance with community ambulation.  Baseline: 10/07/22:  1454ft Goal status: INITIAL   PLAN:  PT FREQUENCY: 1-2x/week  PT DURATION: 6 weeks  PLANNED INTERVENTIONS: Therapeutic exercises, Therapeutic activity, Neuromuscular re-education, Balance training, Gait training, Patient/Family education, Self Care, Joint mobilization, Stair training, Vestibular training, Canalith repositioning, Orthotic/Fit training, DME instructions, Dry Needling, Electrical stimulation, Spinal mobilization, Cryotherapy, Moist heat, scar  mobilization, Manual therapy, and Re-evaluation  PLAN FOR NEXT SESSION: Progress LLE strength/mobility and SLS balance activities  Lovie Macadamia, SPT  Delphia Grates. Fairly IV, PT, DPT Physical Therapist- [place]  [facility]

## 2022-10-12 ENCOUNTER — Ambulatory Visit: Payer: BC Managed Care – PPO

## 2022-10-12 DIAGNOSIS — R262 Difficulty in walking, not elsewhere classified: Secondary | ICD-10-CM | POA: Diagnosis not present

## 2022-10-12 DIAGNOSIS — M25552 Pain in left hip: Secondary | ICD-10-CM

## 2022-10-12 DIAGNOSIS — M6281 Muscle weakness (generalized): Secondary | ICD-10-CM

## 2022-10-12 NOTE — Telephone Encounter (Signed)
Returned call to pt and let him know this information will be forwarded to Dr. Servando Salina and her nurse. Pt verbalized understanding.

## 2022-10-12 NOTE — Therapy (Addendum)
OUTPATIENT PHYSICAL THERAPY LOWER EXTREMITY TREATMENT   Patient Name: Thomas Williamson MRN: [id number] DOB:[birth year], 47 y.o., male Today's Date: 10/12/2022   END OF SESSION:  PT End of Session - 10/12/22 1430     Visit Number 17    Number of Visits 25    Date for PT Re-Evaluation 11/18/22    PT Start Time 1431    PT Stop Time 1511    PT Time Calculation (min) 40 min    Equipment Utilized During Treatment Gait belt    Activity Tolerance Patient tolerated treatment well    Behavior During Therapy WFL for tasks assessed/performed                Past Medical History:  Diagnosis Date   Acetabulum fracture, left (HCC) 06/30/2022   Alcohol use disorder, severe, in early remission (HCC) 03/30/2018   Allergy    Anxiety    Bipolar disorder, in partial remission, most recent episode hypomanic (HCC) 03/30/2018   [facility] Dr. [facility]a [facility]   Bleeding internal hemorrhoids 09/12/2013   Cannabis dependence (HCC) 07/13/2018   Daily use   Cervical disc disease 11/20/2014   Chronic anxiety 07/13/2018   Chronic systolic (congestive) heart failure (HCC)    Complication of anesthesia    Depression    Dyslipidemia    GERD (gastroesophageal reflux disease)    Hernia, inguinal, bilateral 02/24/2011   High ankle sprain of right lower extremity 01/16/2015   History of hiatal hernia    Hx of hepatitis C 10/05/2017   -treated in 2019 -hepatology recommended no further surveillance needed except for LFTs with labs and see hepatologist if elevated   Hypertension    Lipids abnormal 09/29/2013   Pneumonia    PONV (postoperative nausea and vomiting)    Vitamin D deficiency 07/01/2022   Past Surgical History:  Procedure Laterality Date   ANTERIOR CRUCIATE LIGAMENT REPAIR  2010   BIOPSY  03/13/2019   Procedure: BIOPSY;  Surgeon: Benancio Deeds, MD;  Location: [facility];  Service: Gastroenterology;;   COLONOSCOPY N/A 03/13/2019   per Dr. Adela Lank, adenomatous  polyps, repeat in 3 yrs   INGUINAL HERNIA REPAIR  02/23/2012   Procedure: LAPAROSCOPIC BILATERAL INGUINAL HERNIA REPAIR;  Surgeon: Kandis Cocking, MD;  Location: [facility];  Service: General;  Laterality: Bilateral;  Laparoscopic Bilateral Inguinal Hernia Repair with mesh   INSERTION OF MESH  02/23/2012   Procedure: INSERTION OF MESH;  Surgeon: Kandis Cocking, MD;  Location: [facility];  Service: General;  Laterality: N/A;   NOSE SURGERY  [id number]   ORIF ACETABULAR FRACTURE Left 06/30/2022   Procedure: OPEN REDUCTION INTERNAL FIXATION (ORIF) ACETABULAR FRACTURE;  Surgeon: Myrene Galas, MD;  Location: [facility];  Service: Orthopedics;  Laterality: Left;   POLYPECTOMY  03/13/2019   Procedure: POLYPECTOMY;  Surgeon: Benancio Deeds, MD;  Location: [facility];  Service: Gastroenterology;;   Patient Active Problem List   Diagnosis Date Noted   Abnormal LFTs 07/08/2022   Left foot drop 07/01/2022   Vitamin D deficiency 07/01/2022   Acetabulum fracture, left (HCC) 06/30/2022   Alcohol dependence (HCC) 01/05/2022   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   Alcohol-induced acute pancreatitis without infection or necrosis 12/05/2021   Genetic testing 04/21/2019   Benign neoplasm of colon    Loose stools    History of colonic polyps 03/07/2019   Cardiac left ventricular ejection fraction 30-35 percent 03/07/2019   Alcoholic ketoacidosis 11/18/2018   Dyslipidemia 09/16/2018   Fever blister  09/16/2018   Cardiomyopathy, alcoholic (HCC) 09/16/2018   Left bundle branch block (LBBB) 09/16/2018   Cannabis dependence (HCC) 07/13/2018   Chronic anxiety 07/13/2018   Bipolar 1 disorder, depressed, full remission (HCC) 03/30/2018   Cervical disc disease 11/20/2014   Hypertension 02/24/2011    PCP: Nelwyn Salisbury, MD  REFERRING PROVIDER: Myrene Galas, MD  REFERRING DIAG: S72.009A (ICD-10-CM) - Hip fracture [facility])  THERAPY DIAG:  Muscle weakness (generalized)  Pain in left  hip  Difficulty in walking, not elsewhere classified  Rationale for Evaluation and Treatment: Rehabilitation  ONSET DATE: 06/30/22   SUBJECTIVE:   SUBJECTIVE STATEMENT:   Pt reports 2/10 NPS in the L foot. No notable changes since last session.   PERTINENT HISTORY: Pt is a pleasant 47 y.o. male s/p L acetabular fracture repair and hip dislocation after fall walking dog down stairs. Pt with subsequent LLE foot drop still since operation on 06/30/22. Reports managing well at home, just wants to be able to learn how to asc/desc steps and return to independence. Currently unemployed but has a job lined up for when he recovers that will require regular standing/walking, helping people perform ADL's. Denies falls since being home. Worst pain 8/10 NPS in L hip at surgical site. Reports burning pain along deep peroneal nerve distribution starting at fibular head traveling along lateral aspect of lower leg to dorsal part of foot. Pt with hypersensitivity to light touch and tenderness at fibular head.   PAIN:  Are you having pain? Yes: NPRS scale: 0/10 Pain location: L ankle/foot Pain description: burning in foot,  Aggravating factors: some LLE mobility but tolerable, touching L foot.  Relieving factors: Medication,    PRECAUTIONS: None, posterior hip precautions cleared by MD 09/17/22  WEIGHT BEARING RESTRICTIONS: Yes WBAT weaning to no crutches (08/21/22) , post hip precautions 12 weeks from 06/30/22  FALLS:  Has patient fallen in last 6 months? No  LIVING ENVIRONMENT: Lives with: lives with their family Lives in: House/apartment Stairs: Yes: Internal: flight steps; on right going up and External: 1 steps; none Has following equipment at home: Walker - 2 wheeled and bed side commode  OCCUPATION: unemployed before injury. 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 Reports needing to be able to be able to walk, help with group home ADL's.   PLOF: Independent  PATIENT  GOALS: Return to independent  NEXT MD VISIT: PCP, 07/20/22 and Dr. Carola Frost on 07/27/22  OBJECTIVE:   DIAGNOSTIC FINDINGS:  CLINICAL DATA:  Postoperative status.   EXAM: DG HIP (WITH OR WITHOUT PELVIS) 1V*L*   COMPARISON:  CT scan of same day.   FINDINGS: No dislocation is noted. There appears to be improved alignment involving the posterior acetabular fracture noted on prior CT scan. No other bony abnormality is noted.   IMPRESSION: No dislocation is noted. Probable improved alignment involving posterior acetabular fracture on the left.     Electronically Signed   By: Lupita Raider M.D.  PATIENT SURVEYS:  FOTO Deferred to next session  COGNITION: Overall cognitive status: Within functional limits for tasks assessed     SENSATION: Light touch: Impaired  L4-L5 on L foot (great toe, medial malleolus)    POSTURE: No Significant postural limitations  PALPATION: TTP along LLE fibular head along deep peroneal nerve to dorsum of foot  LOWER EXTREMITY ROM:  Active ROM Right eval Left eval  Hip flexion  90  Hip extension    Hip abduction  Hip adduction    Hip internal rotation    Hip external rotation 40 28  Knee flexion    Knee extension 0 0  Ankle dorsiflexion 10 Unable due to L foot drop  Ankle plantarflexion 55 42  Ankle inversion 35 20  Ankle eversion 20 unable   (Blank rows = not tested)  LLE PROM ankle DF/gastroc muscle length: 82 degrees   LOWER EXTREMITY MMT:  MMT Right eval Left eval  Hip flexion 5 5  Hip extension    Hip abduction 5 4  Hip adduction 5 5  Hip internal rotation    Hip external rotation    Knee flexion 5 5  Knee extension 5 5  Ankle dorsiflexion 5 0  Ankle plantarflexion 5 5  Ankle inversion 5 4  Ankle eversion 5 0   (Blank rows = not tested)  LOWER EXTREMITY SPECIAL TESTS:  N/A  FUNCTIONAL TESTS: 09/17/22 5 times sit to stand: 7.54 seconds w/ no AD, L AFO Timed up and go (TUG): 5.65 seconds w/ no AD, L AFO 10  meter walk test: 1.79 m/s w/ no AD, L AFO  GAIT: Distance walked: 10 meters Assistive device utilized: Walker - 2 wheeled Level of assistance: Modified independence Comments: LLE TTWB using step to pattern with RW.   TODAY'S TREATMENT: DATE: 10/12/22  There- ex:  Walking w/ 20# kettlebell RUE/ LUE 2 x100'/ each direction  Lateral stepping w/ GTB 3 x8' down and back Mini squats onto elevated plinth table RLE slightly forward/ LLE backward 2 x10, 2 KG med ball  LLE SLS RDL w/ 10# KB in LUE and RUE support on wall x10    Neuromuscular Re-ed:   Attempts made for L foot drop NMES during today's session on [place] group system with 2x2 x2 electrodes placed on anterior tibialis and peroneus longus. Phase duration 200 micro seconds, frequency 35 pps, mA trialed up to 53. Only able to elicit sensation at electrode placement, no L ankle DF noted, mild inversion noted.    PATIENT EDUCATION:  Education details: Education on importance of L ankle mobility in case pt will need AFO in the future for L foot drop to prevent gastrocnemius contracture. Reviewed post hip precautions and TDWB precautions and importance to comply for tissue healing.  Person educated: Patient Education method: Medical illustrator Education comprehension: verbalized understanding, returned demonstration, and needs further education  HOME EXERCISE PROGRAM: Access Code: [email or web address] URL: [email or web address] Date: 09/24/2022 Prepared by: Ronnie Derby  Exercises - Supine Hamstring Stretch with Strap  - 1 x daily - 7 x weekly - 1 sets - 3 reps - 30 hold - Clamshell  - 1 x daily - 3-4 x weekly - 3 sets - 8 reps - Mini Squat with Counter Support  - 1 x daily - 3-4 x weekly - 3 sets - 8 reps - Lunge with Counter Support  - 1 x daily - 3-4 x weekly - 3 sets - 8 reps - Standing Hip Abduction with Counter Support  - 1 x daily - 3-4 x weekly - 3 sets - 8 reps - Standing Hip Extension with Counter  Support  - 1 x daily - 3-4 x weekly - 3 sets - 8 reps  ASSESSMENT:  CLINICAL IMPRESSION: Session focused on progressing LLE strengthening exercises, and attempting NMES on LLE to assist in L ankle DF . Pt noting improvements in LLE strength noted w/ improved activity tolerance during today's session. NMES was trialed w/ electrodes over the anterior tibialis  and peroneus longus muscle belly. Pt is able to feel the sensation of the electrode placement however only notes very mild L ankle inversion and no L ankle DF contraction after multiple attempts. Pt's LLE decreased DF continues to impede functional tasks and increase fall risks when his AFO is not applied. Pt would continue to benefit from skilled PT interventions to address remaining LLE strength deficits and return to PLOF.  OBJECTIVE IMPAIRMENTS: Abnormal gait, decreased activity tolerance, decreased balance, decreased mobility, difficulty walking, decreased ROM, decreased strength, impaired flexibility, impaired sensation, and pain.   ACTIVITY LIMITATIONS: carrying, lifting, bending, sitting, standing, squatting, sleeping, stairs, bathing, toileting, dressing, locomotion level, and caring for others  PARTICIPATION LIMITATIONS: cleaning, interpersonal relationship, driving, shopping, community activity, occupation, and yard work  PERSONAL FACTORS: Age, Fitness, Past/current experiences, Profession, Time since onset of injury/illness/exacerbation, and 3+ comorbidities: Alcohol use disorder (in early remission), CHF, cardiomyopathy, depression, anxiety  are also affecting patient's functional outcome.   REHAB POTENTIAL: Fair LLE foot drop, visit limit cap of 30 visits, multiple WB and hip precautions  CLINICAL DECISION MAKING: Evolving/moderate complexity  EVALUATION COMPLEXITY: Moderate   GOALS: Goals reviewed with patient? No  SHORT TERM GOALS: Target date: 08/25/22 Pt will be independent with HEP to improve L hip AROM and LLE strength  to return to normalized gait mechanics with LRAD. Baseline: 07/14/22: HEP provided/discussed; 08/25/22: Performing as prescribed. Goal status: MET  2.  Pt will be able to ambulate > 500' with LRAD once WBAT, to complete household and short community distances.  Baseline: 07/14/22: TDWB on LLE, using RW only completing household distances. 09/17/22: Able to ambulate community distances  Goal status: MET   LONG TERM GOALS: Target date: 11/18/22  Pt will improve FOTO to target score to demonstrate clinically significant improvement in functional mobility. Baseline: 07/14/22: 38/67; 09/17/22: 60/67; 10/07/22: 59/67 Goal status: ONGOING  2.  Pt will improve TUG to < 12 sec with no AD to demo reduced falls risk and safe ability toc complete STS and stand to sit transfer without need for DME. Baseline: 07/14/22: LLE TDWB using RW at 13.95 seconds; 09/17/22: 5.65sec no AD Goal status:MET  3.  Pt will improve 5xSTS in < 12 seconds with LLE WBAT and no UE support to demo significant improvement in LE strength and reduced falls risk. Baseline: 07/14/22: 11.33 seconds maintaining LLE TDWB and BUE support on arm rests; 09/17/22: 7.54sec Goal status: MET  4.  Pt will improve 10 meter gait speed to >/= 1.2 m/s with LRAD to demo ability to ambulate at safe velocity to return to mod-I completion of community walking tasks.  Baseline: 07/14/22: LLE TDWB using RW completing at .6 m/s; 09/17/22: 1.79 m/s Goal status: MET   5.  Pt will be able to asc/desc full flight of stairs with reciprocal pattern without rails to return to complete independence with accessing second story of parent's home to access his bedroom. Baseline: 07/14/22: unable to complete due to WB status 09/17/22: Able to ascend/ descend stairs x3 w/o hand rail support.  Goal status: MET  6. Pt will be able to improve LLE leg press to be =/> the RLE to demonstrate improvement in LLE strength to improve functional activities needed for work and in the  home.  Baseline: 09/17/22- RLE- 105#/LLE- 65# 10/07/22: Defer to next session  Goal status: ONGOING  7. Pt will increase 6 MWT by > 165' to display improvements in functional endurance with community ambulation.  Baseline: 10/07/22: 14110ft Goal status: INITIAL  PLAN:  PT FREQUENCY: 1-2x/week  PT DURATION: 6 weeks  PLANNED INTERVENTIONS: Therapeutic exercises, Therapeutic activity, Neuromuscular re-education, Balance training, Gait training, Patient/Family education, Self Care, Joint mobilization, Stair training, Vestibular training, Canalith repositioning, Orthotic/Fit training, DME instructions, Dry Needling, Electrical stimulation, Spinal mobilization, Cryotherapy, Moist heat, scar mobilization, Manual therapy, and Re-evaluation  PLAN FOR NEXT SESSION: Re- attempt NMES with a different current?, Progress LLE strength/mobility and SLS balance activities  Lovie Macadamia, SPT    Nolon Bussing, PT, DPT Physical Therapist - [facility]  10/12/22, 5:01 PM

## 2022-10-12 NOTE — Telephone Encounter (Signed)
Pt  was told to call in with info to his Gen Card In [place].     Dr Loney Loh Phone number [phone] Fax number - [phone] Address:  [address]  [address] [place] [address]

## 2022-10-13 ENCOUNTER — Other Ambulatory Visit: Payer: Self-pay

## 2022-10-13 NOTE — Telephone Encounter (Signed)
Pt's appt is at 1:45 pm according to Dusty H, LPN.

## 2022-10-13 NOTE — Telephone Encounter (Signed)
Called Dr. Mosetta Putt office in [place] and pt has an appt scheduled on 10/29/22.

## 2022-10-14 ENCOUNTER — Telehealth: Payer: Self-pay | Admitting: Cardiology

## 2022-10-14 ENCOUNTER — Ambulatory Visit: Payer: BC Managed Care – PPO

## 2022-10-14 DIAGNOSIS — M25552 Pain in left hip: Secondary | ICD-10-CM

## 2022-10-14 DIAGNOSIS — M6281 Muscle weakness (generalized): Secondary | ICD-10-CM | POA: Diagnosis not present

## 2022-10-14 DIAGNOSIS — R262 Difficulty in walking, not elsewhere classified: Secondary | ICD-10-CM

## 2022-10-14 DIAGNOSIS — F331 Major depressive disorder, recurrent, moderate: Secondary | ICD-10-CM | POA: Diagnosis not present

## 2022-10-14 NOTE — Telephone Encounter (Signed)
Spoke with patient and he was calling to follow up about letter to attend rehab in [place]. I did inform him letter was been made just waiting provider signature. He states it needs to be faxed to [phone] attention Juliene Pina  Please call patient once letter has been faxed

## 2022-10-14 NOTE — Telephone Encounter (Signed)
Patient is calling back to follow up on release letter stating it is an urgent matter. He reports he spoke with a nurse name Dusty who is aware this is an urgent matter.   Please advise.

## 2022-10-14 NOTE — Telephone Encounter (Signed)
Patient's son father is requesting to speak with RN Leavy Cella

## 2022-10-14 NOTE — Telephone Encounter (Signed)
Letter has been faxed.  Left voicemail for  patient to return call to office.

## 2022-10-14 NOTE — Therapy (Cosign Needed Addendum)
OUTPATIENT PHYSICAL THERAPY LOWER EXTREMITY TREATMENT   Patient Name: Benecio Blood MRN: [id number] DOB:06/29/1975, 47 y.o., male Today's Date: 10/14/2022   END OF SESSION:  PT End of Session - 10/14/22 0903     Visit Number 18    Number of Visits 25    Date for PT Re-Evaluation 11/18/22    PT Start Time 0904    PT Stop Time 0946    PT Time Calculation (min) 42 min    Equipment Utilized During Treatment Gait belt    Activity Tolerance Patient tolerated treatment well    Behavior During Therapy [facility] for tasks assessed/performed                Past Medical History:  Diagnosis Date   Acetabulum fracture, left (HCC) 06/30/2022   Alcohol use disorder, severe, in early remission (HCC) 03/30/2018   Allergy    Anxiety    Bipolar disorder, in partial remission, most recent episode hypomanic (HCC) 03/30/2018   [facility] Dr. [facility]a [facility]   Bleeding internal hemorrhoids 09/12/2013   Cannabis dependence (HCC) 07/13/2018   Daily use   Cervical disc disease 11/20/2014   Chronic anxiety 07/13/2018   Chronic systolic (congestive) heart failure (HCC)    Complication of anesthesia    Depression    Dyslipidemia    GERD (gastroesophageal reflux disease)    Hernia, inguinal, bilateral 02/24/2011   High ankle sprain of right lower extremity 01/16/2015   History of hiatal hernia    Hx of hepatitis C 10/05/2017   -treated in 2019 -hepatology recommended no further surveillance needed except for LFTs with labs and see hepatologist if elevated   Hypertension    Lipids abnormal 09/29/2013   Pneumonia    PONV (postoperative nausea and vomiting)    Vitamin D deficiency 07/01/2022   Past Surgical History:  Procedure Laterality Date   ANTERIOR CRUCIATE LIGAMENT REPAIR  2010   BIOPSY  03/13/2019   Procedure: BIOPSY;  Surgeon: Benancio Deeds, MD;  Location: [facility];  Service: Gastroenterology;;   COLONOSCOPY N/A 03/13/2019   per Dr. Adela Lank, adenomatous  polyps, repeat in 3 yrs   INGUINAL HERNIA REPAIR  02/23/2012   Procedure: LAPAROSCOPIC BILATERAL INGUINAL HERNIA REPAIR;  Surgeon: Kandis Cocking, MD;  Location: [facility];  Service: General;  Laterality: Bilateral;  Laparoscopic Bilateral Inguinal Hernia Repair with mesh   INSERTION OF MESH  02/23/2012   Procedure: INSERTION OF MESH;  Surgeon: Kandis Cocking, MD;  Location: [facility];  Service: General;  Laterality: N/A;   NOSE SURGERY  [id number]   ORIF ACETABULAR FRACTURE Left 06/30/2022   Procedure: OPEN REDUCTION INTERNAL FIXATION (ORIF) ACETABULAR FRACTURE;  Surgeon: Myrene Galas, MD;  Location: [facility];  Service: Orthopedics;  Laterality: Left;   POLYPECTOMY  03/13/2019   Procedure: POLYPECTOMY;  Surgeon: Benancio Deeds, MD;  Location: [facility];  Service: Gastroenterology;;   Patient Active Problem List   Diagnosis Date Noted   Abnormal LFTs 07/08/2022   Left foot drop 07/01/2022   Vitamin D deficiency 07/01/2022   Acetabulum fracture, left (HCC) 06/30/2022   Alcohol dependence (HCC) 01/05/2022   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   Alcohol-induced acute pancreatitis without infection or necrosis 12/05/2021   Genetic testing 04/21/2019   Benign neoplasm of colon    Loose stools    History of colonic polyps 03/07/2019   Cardiac left ventricular ejection fraction 30-35 percent 03/07/2019   Alcoholic ketoacidosis 11/18/2018   Dyslipidemia 09/16/2018   Fever blister  09/16/2018   Cardiomyopathy, alcoholic (HCC) 09/16/2018   Left bundle branch block (LBBB) 09/16/2018   Cannabis dependence (HCC) 07/13/2018   Chronic anxiety 07/13/2018   Bipolar 1 disorder, depressed, full remission (HCC) 03/30/2018   Cervical disc disease 11/20/2014   Hypertension 02/24/2011    PCP: Nelwyn Salisbury, MD  REFERRING PROVIDER: Myrene Galas, MD  REFERRING DIAG: S72.009A (ICD-10-CM) - Hip fracture [facility])  THERAPY DIAG:  Muscle weakness (generalized)  Pain in left  hip  Difficulty in walking, not elsewhere classified  Rationale for Evaluation and Treatment: Rehabilitation  ONSET DATE: 06/30/22   SUBJECTIVE:   SUBJECTIVE STATEMENT:   Pt reports 1/10 NPS in the L foot. No notable changes since last session.   PERTINENT HISTORY: Pt is a pleasant 47 y.o. male s/p L acetabular fracture repair and hip dislocation after fall walking dog down stairs. Pt with subsequent LLE foot drop still since operation on 06/30/22. Reports managing well at home, just wants to be able to learn how to asc/desc steps and return to independence. Currently unemployed but has a job lined up for when he recovers that will require regular standing/walking, helping people perform ADL's. Denies falls since being home. Worst pain 8/10 NPS in L hip at surgical site. Reports burning pain along deep peroneal nerve distribution starting at fibular head traveling along lateral aspect of lower leg to dorsal part of foot. Pt with hypersensitivity to light touch and tenderness at fibular head.   PAIN:  Are you having pain? Yes: NPRS scale: 1/10 Pain location: L ankle/foot Pain description: burning in foot,  Aggravating factors: some LLE mobility but tolerable, touching L foot.  Relieving factors: Medication,    PRECAUTIONS: None, posterior hip precautions cleared by MD 09/17/22  WEIGHT BEARING RESTRICTIONS: Yes WBAT weaning to no crutches (08/21/22) , post hip precautions 12 weeks from 06/30/22  FALLS:  Has patient fallen in last 6 months? No  LIVING ENVIRONMENT: Lives with: lives with their family Lives in: House/apartment Stairs: Yes: Internal: flight steps; on right going up and External: 1 steps; none Has following equipment at home: Walker - 2 wheeled and bed side commode  OCCUPATION: unemployed before injury. 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 Reports needing to be able to be able to walk, help with group home ADL's.   PLOF: Independent  PATIENT  GOALS: Return to independent  NEXT MD VISIT: PCP, 07/20/22 and Dr. Carola Frost on 07/27/22  OBJECTIVE:   DIAGNOSTIC FINDINGS:  CLINICAL DATA:  Postoperative status.   EXAM: DG HIP (WITH OR WITHOUT PELVIS) 1V*L*   COMPARISON:  CT scan of same day.   FINDINGS: No dislocation is noted. There appears to be improved alignment involving the posterior acetabular fracture noted on prior CT scan. No other bony abnormality is noted.   IMPRESSION: No dislocation is noted. Probable improved alignment involving posterior acetabular fracture on the left.     Electronically Signed   By: Lupita Raider M.D.  PATIENT SURVEYS:  FOTO Deferred to next session  COGNITION: Overall cognitive status: Within functional limits for tasks assessed     SENSATION: Light touch: Impaired  L4-L5 on L foot (great toe, medial malleolus)    POSTURE: No Significant postural limitations  PALPATION: TTP along LLE fibular head along deep peroneal nerve to dorsum of foot  LOWER EXTREMITY ROM:  Active ROM Right eval Left eval  Hip flexion  90  Hip extension    Hip abduction  Hip adduction    Hip internal rotation    Hip external rotation 40 28  Knee flexion    Knee extension 0 0  Ankle dorsiflexion 10 Unable due to L foot drop  Ankle plantarflexion 55 42  Ankle inversion 35 20  Ankle eversion 20 unable   (Blank rows = not tested)  LLE PROM ankle DF/gastroc muscle length: 82 degrees   LOWER EXTREMITY MMT:  MMT Right eval Left eval  Hip flexion 5 5  Hip extension    Hip abduction 5 4  Hip adduction 5 5  Hip internal rotation    Hip external rotation    Knee flexion 5 5  Knee extension 5 5  Ankle dorsiflexion 5 0  Ankle plantarflexion 5 5  Ankle inversion 5 4  Ankle eversion 5 0   (Blank rows = not tested)  LOWER EXTREMITY SPECIAL TESTS:  N/A  FUNCTIONAL TESTS: 09/17/22 5 times sit to stand: 7.54 seconds w/ no AD, L AFO Timed up and go (TUG): 5.65 seconds w/ no AD, L AFO 10  meter walk test: 1.79 m/s w/ no AD, L AFO  GAIT: Distance walked: 10 meters Assistive device utilized: Walker - 2 wheeled Level of assistance: Modified independence Comments: LLE TTWB using step to pattern with RW.   TODAY'S TREATMENT: DATE: 10/14/22  There- ex:  Walking w/ 15# DB in RUE/ LUE 4 x100'/ each direction  PROM Supine L abductor stretch w/ knee flexion crossing midline 2 x32min  Supine L piriformis stretch x30 sec (added to HEP)    Improved exercise technique, movement at target joints, use of target muscles after min verbal, visual, tactile cues.      Dry Needling Modality: (unbilled) Dry needling performed to L tibialis anterior muscle to decrease pain and spasms along patient's L anterior leg region with patient in supine utilizing 2 dry needle(s) 0.64mm x 30mm with 3 sticks at the trigger points . Patient educated about the risks and benefits from therapy and verbally consents to treatment.  Dry needling performed by Loralyn Freshwater PT, DPT who is certified in this technique.        PATIENT EDUCATION:  Education details: Education on importance of L ankle mobility in case pt will need AFO in the future for L foot drop to prevent gastrocnemius contracture. Reviewed post hip precautions and TDWB precautions and importance to comply for tissue healing.  Person educated: Patient Education method: Medical illustrator Education comprehension: verbalized understanding, returned demonstration, and needs further education  HOME EXERCISE PROGRAM: (Miguel to insert updated POC) Access Code: [email or web address] URL: [email or web address] Date: 09/24/2022 Prepared by: Ronnie Derby  Exercises - Supine Hamstring Stretch with Strap  - 1 x daily - 7 x weekly - 1 sets - 3 reps - 30 hold - Clamshell  - 1 x daily - 3-4 x weekly - 3 sets - 8 reps - Mini Squat with Counter Support  - 1 x daily - 3-4 x weekly - 3 sets - 8 reps - Lunge with Counter Support  - 1 x  daily - 3-4 x weekly - 3 sets - 8 reps - Standing Hip Abduction with Counter Support  - 1 x daily - 3-4 x weekly - 3 sets - 8 reps - Standing Hip Extension with Counter Support  - 1 x daily - 3-4 x weekly - 3 sets - 8 reps  ASSESSMENT:  CLINICAL IMPRESSION: Session focused on progressing LLE strengthening exercises, and assessing/ performing dry needling of the L anterior  tibialis musculature. Pt noting improvements in LLE strength noted w/ improved activity tolerance during today's session. Pt tolerated dry needling treatment well of the L anterior tibialis muscle. Upon assessment, pt noted increased musculature tension at the muscle belly of the the L anterior tibialis muscle and moderate muscle twitch-response was noted during dry needling.  Pt continues to display decreased L ankle DF w/ no muscular contraction present when attempting L DF AROM. Upon assessment, pt additionally notes tightness of LLE hip abductors and with IR, and a supine abductor stretch was added to pt's HEP. Pt would continue to benefit from skilled PT interventions to address remaining LLE strength deficits and return to PLOF.  OBJECTIVE IMPAIRMENTS: Abnormal gait, decreased activity tolerance, decreased balance, decreased mobility, difficulty walking, decreased ROM, decreased strength, impaired flexibility, impaired sensation, and pain.   ACTIVITY LIMITATIONS: carrying, lifting, bending, sitting, standing, squatting, sleeping, stairs, bathing, toileting, dressing, locomotion level, and caring for others  PARTICIPATION LIMITATIONS: cleaning, interpersonal relationship, driving, shopping, community activity, occupation, and yard work  PERSONAL FACTORS: Age, Fitness, Past/current experiences, Profession, Time since onset of injury/illness/exacerbation, and 3+ comorbidities: Alcohol use disorder (in early remission), CHF, cardiomyopathy, depression, anxiety  are also affecting patient's functional outcome.   REHAB POTENTIAL:  Fair LLE foot drop, visit limit cap of 30 visits, multiple WB and hip precautions  CLINICAL DECISION MAKING: Evolving/moderate complexity  EVALUATION COMPLEXITY: Moderate   GOALS: Goals reviewed with patient? No  SHORT TERM GOALS: Target date: 08/25/22 Pt will be independent with HEP to improve L hip AROM and LLE strength to return to normalized gait mechanics with LRAD. Baseline: 07/14/22: HEP provided/discussed; 08/25/22: Performing as prescribed. Goal status: MET  2.  Pt will be able to ambulate > 500' with LRAD once WBAT, to complete household and short community distances.  Baseline: 07/14/22: TDWB on LLE, using RW only completing household distances. 09/17/22: Able to ambulate community distances  Goal status: MET   LONG TERM GOALS: Target date: 11/18/22  Pt will improve FOTO to target score to demonstrate clinically significant improvement in functional mobility. Baseline: 07/14/22: 38/67; 09/17/22: 60/67; 10/07/22: 59/67 Goal status: ONGOING  2.  Pt will improve TUG to < 12 sec with no AD to demo reduced falls risk and safe ability toc complete STS and stand to sit transfer without need for DME. Baseline: 07/14/22: LLE TDWB using RW at 13.95 seconds; 09/17/22: 5.65sec no AD Goal status:MET  3.  Pt will improve 5xSTS in < 12 seconds with LLE WBAT and no UE support to demo significant improvement in LE strength and reduced falls risk. Baseline: 07/14/22: 11.33 seconds maintaining LLE TDWB and BUE support on arm rests; 09/17/22: 7.54sec Goal status: MET  4.  Pt will improve 10 meter gait speed to >/= 1.2 m/s with LRAD to demo ability to ambulate at safe velocity to return to mod-I completion of community walking tasks.  Baseline: 07/14/22: LLE TDWB using RW completing at .6 m/s; 09/17/22: 1.79 m/s Goal status: MET   5.  Pt will be able to asc/desc full flight of stairs with reciprocal pattern without rails to return to complete independence with accessing second story of parent's home  to access his bedroom. Baseline: 07/14/22: unable to complete due to WB status 09/17/22: Able to ascend/ descend stairs x3 w/o hand rail support.  Goal status: MET  6. Pt will be able to improve LLE leg press to be =/> the RLE to demonstrate improvement in LLE strength to improve functional activities needed for work and  in the home.  Baseline: 09/17/22- RLE- 105#/LLE- 65# 10/07/22: Defer to next session  Goal status: ONGOING  7. Pt will increase 6 MWT by > 165' to display improvements in functional endurance with community ambulation.  Baseline: 10/07/22: 1447ft Goal status: INITIAL   PLAN:  PT FREQUENCY: 1-2x/week  PT DURATION: 6 weeks  PLANNED INTERVENTIONS: Therapeutic exercises, Therapeutic activity, Neuromuscular re-education, Balance training, Gait training, Patient/Family education, Self Care, Joint mobilization, Stair training, Vestibular training, Canalith repositioning, Orthotic/Fit training, DME instructions, Dry Needling, Electrical stimulation, Spinal mobilization, Cryotherapy, Moist heat, scar mobilization, Manual therapy, and Re-evaluation  PLAN FOR NEXT SESSION: Progress LLE strength/mobility and SLS balance activities, address LLE hamstring, hip abductor and IR tightness.   Lovie Macadamia, SPT     Student was supervised throughout session by PT.    Loralyn Freshwater PT, DPT

## 2022-10-14 NOTE — Telephone Encounter (Signed)
Michael with a detox facility in [place] calling asking to speak with "Boneta Lucks". I wasn't sure who he was referring to. Please advise.

## 2022-10-20 ENCOUNTER — Ambulatory Visit: Payer: BC Managed Care – PPO

## 2022-10-20 DIAGNOSIS — M6281 Muscle weakness (generalized): Secondary | ICD-10-CM | POA: Diagnosis not present

## 2022-10-20 DIAGNOSIS — R262 Difficulty in walking, not elsewhere classified: Secondary | ICD-10-CM | POA: Diagnosis not present

## 2022-10-20 DIAGNOSIS — M25552 Pain in left hip: Secondary | ICD-10-CM

## 2022-10-20 NOTE — Telephone Encounter (Signed)
Letter successfully faxed on 10/14/2022 by another nurse.

## 2022-10-20 NOTE — Therapy (Cosign Needed Addendum)
OUTPATIENT PHYSICAL THERAPY LOWER EXTREMITY TREATMENT   Patient Name: Thomas Williamson MRN: [id number] DOB:[birth year], 47 y.o., male Today's Date: 10/21/2022   END OF SESSION:  PT End of Session - 10/20/22 1509     Visit Number 19    Number of Visits 25    Date for PT Re-Evaluation 11/18/22    PT Start Time 1513    PT Stop Time 1546    PT Time Calculation (min) 33 min    Equipment Utilized During Treatment Gait belt    Activity Tolerance Patient tolerated treatment well    Behavior During Therapy WFL for tasks assessed/performed                Past Medical History:  Diagnosis Date   Acetabulum fracture, left (HCC) 06/30/2022   Alcohol use disorder, severe, in early remission (HCC) 03/30/2018   Allergy    Anxiety    Bipolar disorder, in partial remission, most recent episode hypomanic (HCC) 03/30/2018   [facility] Dr[facility]a [facility]   Bleeding internal hemorrhoids 09/12/2013   Cannabis dependence (HCC) 07/13/2018   Daily use   Cervical disc disease 11/20/2014   Chronic anxiety 07/13/2018   Chronic systolic (congestive) heart failure (HCC)    Complication of anesthesia    Depression    Dyslipidemia    GERD (gastroesophageal reflux disease)    Hernia, inguinal, bilateral 02/24/2011   High ankle sprain of right lower extremity 01/16/2015   History of hiatal hernia    Hx of hepatitis C 10/05/2017   -treated in 2019 -hepatology recommended no further surveillance needed except for LFTs with labs and see hepatologist if elevated   Hypertension    Lipids abnormal 09/29/2013   Pneumonia    PONV (postoperative nausea and vomiting)    Vitamin D deficiency 07/01/2022   Past Surgical History:  Procedure Laterality Date   ANTERIOR CRUCIATE LIGAMENT REPAIR  2010   BIOPSY  03/13/2019   Procedure: BIOPSY;  Surgeon: Benancio Deeds, MD;  Location: [facility];  Service: Gastroenterology;;   COLONOSCOPY N/A 03/13/2019   per Dr. Adela Lank, adenomatous  polyps, repeat in 3 yrs   INGUINAL HERNIA REPAIR  02/23/2012   Procedure: LAPAROSCOPIC BILATERAL INGUINAL HERNIA REPAIR;  Surgeon: Kandis Cocking, MD;  Location: [facility];  Service: General;  Laterality: Bilateral;  Laparoscopic Bilateral Inguinal Hernia Repair with mesh   INSERTION OF MESH  02/23/2012   Procedure: INSERTION OF MESH;  Surgeon: Kandis Cocking, MD;  Location: [facility];  Service: General;  Laterality: N/A;   NOSE SURGERY  [id number]   ORIF ACETABULAR FRACTURE Left 06/30/2022   Procedure: OPEN REDUCTION INTERNAL FIXATION (ORIF) ACETABULAR FRACTURE;  Surgeon: Myrene Galas, MD;  Location: [facility];  Service: Orthopedics;  Laterality: Left;   POLYPECTOMY  03/13/2019   Procedure: POLYPECTOMY;  Surgeon: Benancio Deeds, MD;  Location: [facility];  Service: Gastroenterology;;   Patient Active Problem List   Diagnosis Date Noted   Abnormal LFTs 07/08/2022   Left foot drop 07/01/2022   Vitamin D deficiency 07/01/2022   Acetabulum fracture, left (HCC) 06/30/2022   Alcohol dependence (HCC) 01/05/2022   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   Alcohol-induced acute pancreatitis without infection or necrosis 12/05/2021   Genetic testing 04/21/2019   Benign neoplasm of colon    Loose stools    History of colonic polyps 03/07/2019   Cardiac left ventricular ejection fraction 30-35 percent 03/07/2019   Alcoholic ketoacidosis 11/18/2018   Dyslipidemia 09/16/2018   Fever blister  09/16/2018   Cardiomyopathy, alcoholic (HCC) 09/16/2018   Left bundle branch block (LBBB) 09/16/2018   Cannabis dependence (HCC) 07/13/2018   Chronic anxiety 07/13/2018   Bipolar 1 disorder, depressed, full remission (HCC) 03/30/2018   Cervical disc disease 11/20/2014   Hypertension 02/24/2011    PCP: Nelwyn Salisbury, MD  REFERRING PROVIDER: Myrene Galas, MD  REFERRING DIAG: S72.009A (ICD-10-CM) - Hip fracture [facility])  THERAPY DIAG:  Muscle weakness (generalized)  Pain in left  hip  Difficulty in walking, not elsewhere classified  Rationale for Evaluation and Treatment: Rehabilitation  ONSET DATE: 06/30/22   SUBJECTIVE:   SUBJECTIVE STATEMENT:  Pt reports 0/10 NPS in the L foot. L toe bothers pt sometimes but no notable changes over the weekend.    PERTINENT HISTORY: Pt is a pleasant 47 y.o. male s/p L acetabular fracture repair and hip dislocation after fall walking dog down stairs. Pt with subsequent LLE foot drop still since operation on 06/30/22. Reports managing well at home, just wants to be able to learn how to asc/desc steps and return to independence. Currently unemployed but has a job lined up for when he recovers that will require regular standing/walking, helping people perform ADL's. Denies falls since being home. Worst pain 8/10 NPS in L hip at surgical site. Reports burning pain along deep peroneal nerve distribution starting at fibular head traveling along lateral aspect of lower leg to dorsal part of foot. Pt with hypersensitivity to light touch and tenderness at fibular head.   PAIN:  Are you having pain? Yes: NPRS scale: 1/10 Pain location: L ankle/foot Pain description: burning in foot,  Aggravating factors: some LLE mobility but tolerable, touching L foot.  Relieving factors: Medication,    PRECAUTIONS: None, posterior hip precautions cleared by MD 09/17/22  WEIGHT BEARING RESTRICTIONS: Yes WBAT weaning to no crutches (08/21/22) , post hip precautions 12 weeks from 06/30/22  FALLS:  Has patient fallen in last 6 months? No  LIVING ENVIRONMENT: Lives with: lives with their family Lives in: House/apartment Stairs: Yes: Internal: flight steps; on right going up and External: 1 steps; none Has following equipment at home: Walker - 2 wheeled and bed side commode  OCCUPATION: unemployed before injury. 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 Reports needing to be able to be able to walk, help with group home ADL's.    PLOF: Independent  PATIENT GOALS: Return to independent  NEXT MD VISIT: PCP, 07/20/22 and Dr. Carola Frost on 07/27/22  OBJECTIVE:   DIAGNOSTIC FINDINGS:  CLINICAL DATA:  Postoperative status.   EXAM: DG HIP (WITH OR WITHOUT PELVIS) 1V*L*   COMPARISON:  CT scan of same day.   FINDINGS: No dislocation is noted. There appears to be improved alignment involving the posterior acetabular fracture noted on prior CT scan. No other bony abnormality is noted.   IMPRESSION: No dislocation is noted. Probable improved alignment involving posterior acetabular fracture on the left.     Electronically Signed   By: Lupita Raider M.D.  PATIENT SURVEYS:  FOTO Deferred to next session  COGNITION: Overall cognitive status: Within functional limits for tasks assessed     SENSATION: Light touch: Impaired  L4-L5 on L foot (great toe, medial malleolus)    POSTURE: No Significant postural limitations  PALPATION: TTP along LLE fibular head along deep peroneal nerve to dorsum of foot  LOWER EXTREMITY ROM:  Active ROM Right eval Left eval  Hip flexion  90  Hip extension  Hip abduction    Hip adduction    Hip internal rotation    Hip external rotation 40 28  Knee flexion    Knee extension 0 0  Ankle dorsiflexion 10 Unable due to L foot drop  Ankle plantarflexion 55 42  Ankle inversion 35 20  Ankle eversion 20 unable   (Blank rows = not tested)  LLE PROM ankle DF/gastroc muscle length: 82 degrees   LOWER EXTREMITY MMT:  MMT Right eval Left eval  Hip flexion 5 5  Hip extension    Hip abduction 5 4  Hip adduction 5 5  Hip internal rotation    Hip external rotation    Knee flexion 5 5  Knee extension 5 5  Ankle dorsiflexion 5 0  Ankle plantarflexion 5 5  Ankle inversion 5 4  Ankle eversion 5 0   (Blank rows = not tested)  LOWER EXTREMITY SPECIAL TESTS:  N/A  FUNCTIONAL TESTS: 09/17/22 5 times sit to stand: 7.54 seconds w/ no AD, L AFO Timed up and go (TUG): 5.65  seconds w/ no AD, L AFO 10 meter walk test: 1.79 m/s w/ no AD, L AFO  GAIT: Distance walked: 10 meters Assistive device utilized: Walker - 2 wheeled Level of assistance: Modified independence Comments: LLE TTWB using step to pattern with RW.   TODAY'S TREATMENT: DATE: 10/20/22  There- ex: Walking on treadmill 1.0 lvl 3.0 incline x 5 minutes RDL's onto 8" step w/ 10# KB 2 x10  SLS ball throws with 2KG ball onto re-bounder RLE/ LLE x10  SLS ball throws w/ 2KG ball onto re-bounder w/ lumbar rotation LLE x10 Mini squats w/ 10# KB, staggered stance LLE in front of RLE 2 x10  Neuro re-ed:  Reciprocal gait over 6" obstacle (5) x3 down and back  Hopping over 6" obstacle landing, landing on LLE x2 down and back  Plyometric hopping off of first step, emphasis on soft landing on bilat LE's x10    PATIENT EDUCATION:  Education details: Education on importance of L ankle mobility in case pt will need AFO in the future for L foot drop to prevent gastrocnemius contracture. Reviewed post hip precautions and TDWB precautions and importance to comply for tissue healing.  Person educated: Patient Education method: Medical illustrator Education comprehension: verbalized understanding, returned demonstration, and needs further education  HOME EXERCISE PROGRAM:  Access Code: [email or web address] URL: [email or web address] Date: 09/24/2022 Prepared by: Ronnie Derby  Exercises - Supine Hamstring Stretch with Strap  - 1 x daily - 7 x weekly - 1 sets - 3 reps - 30 hold - Clamshell  - 1 x daily - 3-4 x weekly - 3 sets - 8 reps - Mini Squat with Counter Support  - 1 x daily - 3-4 x weekly - 3 sets - 8 reps - Lunge with Counter Support  - 1 x daily - 3-4 x weekly - 3 sets - 8 reps - Standing Hip Abduction with Counter Support  - 1 x daily - 3-4 x weekly - 3 sets - 8 reps - Standing Hip Extension with Counter Support  - 1 x daily - 3-4 x weekly - 3 sets - 8 reps  ASSESSMENT:  CLINICAL  IMPRESSION: Session focused on progressing LLE strengthening and SLS plyometric exercises. Pt noting improvements in LLE strength noted w/ improved activity tolerance during today's session. Pt continues to note moderate balance deficits on the LLE, especially with SLS activities. Pt ended session early today d/t increased fatigue. Pt would continue  to benefit from skilled PT interventions to address remaining LLE strength deficits and return to PLOF.  OBJECTIVE IMPAIRMENTS: Abnormal gait, decreased activity tolerance, decreased balance, decreased mobility, difficulty walking, decreased ROM, decreased strength, impaired flexibility, impaired sensation, and pain.   ACTIVITY LIMITATIONS: carrying, lifting, bending, sitting, standing, squatting, sleeping, stairs, bathing, toileting, dressing, locomotion level, and caring for others  PARTICIPATION LIMITATIONS: cleaning, interpersonal relationship, driving, shopping, community activity, occupation, and yard work  PERSONAL FACTORS: Age, Fitness, Past/current experiences, Profession, Time since onset of injury/illness/exacerbation, and 3+ comorbidities: Alcohol use disorder (in early remission), CHF, cardiomyopathy, depression, anxiety  are also affecting patient's functional outcome.   REHAB POTENTIAL: Fair LLE foot drop, visit limit cap of 30 visits, multiple WB and hip precautions  CLINICAL DECISION MAKING: Evolving/moderate complexity  EVALUATION COMPLEXITY: Moderate   GOALS: Goals reviewed with patient? No  SHORT TERM GOALS: Target date: 08/25/22 Pt will be independent with HEP to improve L hip AROM and LLE strength to return to normalized gait mechanics with LRAD. Baseline: 07/14/22: HEP provided/discussed; 08/25/22: Performing as prescribed. Goal status: MET  2.  Pt will be able to ambulate > 500' with LRAD once WBAT, to complete household and short community distances.  Baseline: 07/14/22: TDWB on LLE, using RW only completing household  distances. 09/17/22: Able to ambulate community distances  Goal status: MET   LONG TERM GOALS: Target date: 11/18/22  Pt will improve FOTO to target score to demonstrate clinically significant improvement in functional mobility. Baseline: 07/14/22: 38/67; 09/17/22: 60/67; 10/07/22: 59/67 Goal status: ONGOING  2.  Pt will improve TUG to < 12 sec with no AD to demo reduced falls risk and safe ability toc complete STS and stand to sit transfer without need for DME. Baseline: 07/14/22: LLE TDWB using RW at 13.95 seconds; 09/17/22: 5.65sec no AD Goal status:MET  3.  Pt will improve 5xSTS in < 12 seconds with LLE WBAT and no UE support to demo significant improvement in LE strength and reduced falls risk. Baseline: 07/14/22: 11.33 seconds maintaining LLE TDWB and BUE support on arm rests; 09/17/22: 7.54sec Goal status: MET  4.  Pt will improve 10 meter gait speed to >/= 1.2 m/s with LRAD to demo ability to ambulate at safe velocity to return to mod-I completion of community walking tasks.  Baseline: 07/14/22: LLE TDWB using RW completing at .6 m/s; 09/17/22: 1.79 m/s Goal status: MET   5.  Pt will be able to asc/desc full flight of stairs with reciprocal pattern without rails to return to complete independence with accessing second story of parent's home to access his bedroom. Baseline: 07/14/22: unable to complete due to WB status 09/17/22: Able to ascend/ descend stairs x3 w/o hand rail support.  Goal status: MET  6. Pt will be able to improve LLE leg press to be =/> the RLE to demonstrate improvement in LLE strength to improve functional activities needed for work and in the home.  Baseline: 09/17/22- RLE- 105#/LLE- 65# 10/07/22: Defer to next session  Goal status: ONGOING  7. Pt will increase 6 MWT by > 165' to display improvements in functional endurance with community ambulation.  Baseline: 10/07/22: 1471ft Goal status: INITIAL   PLAN:  PT FREQUENCY: 1-2x/week  PT DURATION: 6  weeks  PLANNED INTERVENTIONS: Therapeutic exercises, Therapeutic activity, Neuromuscular re-education, Balance training, Gait training, Patient/Family education, Self Care, Joint mobilization, Stair training, Vestibular training, Canalith repositioning, Orthotic/Fit training, DME instructions, Dry Needling, Electrical stimulation, Spinal mobilization, Cryotherapy, Moist heat, scar mobilization, Manual therapy, and Re-evaluation  PLAN FOR NEXT SESSION: Progress LLE strength/mobility and SLS balance activities, emphasize LE stretching.   Lovie Macadamia, SPT   Delphia Grates. Fairly IV, PT, DPT Physical Therapist- [place]  [facility]

## 2022-10-21 ENCOUNTER — Ambulatory Visit: Payer: BC Managed Care – PPO | Admitting: Gastroenterology

## 2022-10-21 ENCOUNTER — Other Ambulatory Visit (INDEPENDENT_AMBULATORY_CARE_PROVIDER_SITE_OTHER): Payer: BC Managed Care – PPO

## 2022-10-21 ENCOUNTER — Encounter: Payer: Self-pay | Admitting: Gastroenterology

## 2022-10-21 VITALS — BP 104/64 | HR 97 | Ht 69.0 in | Wt 173.4 lb

## 2022-10-21 DIAGNOSIS — I426 Alcoholic cardiomyopathy: Secondary | ICD-10-CM

## 2022-10-21 DIAGNOSIS — R748 Abnormal levels of other serum enzymes: Secondary | ICD-10-CM

## 2022-10-21 DIAGNOSIS — F1029 Alcohol dependence with unspecified alcohol-induced disorder: Secondary | ICD-10-CM

## 2022-10-21 DIAGNOSIS — F331 Major depressive disorder, recurrent, moderate: Secondary | ICD-10-CM | POA: Diagnosis not present

## 2022-10-21 LAB — HEPATIC FUNCTION PANEL
ALT: 11 U/L (ref 0–53)
AST: 11 U/L (ref 0–37)
Albumin: 4.3 g/dL (ref 3.5–5.2)
Alkaline Phosphatase: 113 U/L (ref 39–117)
Bilirubin, Direct: 0 mg/dL (ref 0.0–0.3)
Total Bilirubin: 0.3 mg/dL (ref 0.2–1.2)
Total Protein: 7.1 g/dL (ref 6.0–8.3)

## 2022-10-21 MED ORDER — NA SULFATE-K SULFATE-MG SULF 17.5-3.13-1.6 GM/177ML PO SOLN: 1.0000 | Freq: Once | ORAL | 0 refills | Status: AC

## 2022-10-21 NOTE — Progress Notes (Signed)
HPI :  47 year old male here for a follow-up visit from hospitalization in June where he was seen by our team for elevated liver enzymes.  Recall he also has a history of colon polyps.  He initially was admitted to the hospital after mechanical fall while walking his dog, fell down a flight of stairs and resulted in a hip fracture with dislocation and he needed surgery on May 28.  While he was in the hospital he was noted to have elevated AST over ALT.  AST peaked at 630 with ALT of 302, alk phos 196, subsequent days AST and ALT down trended.  Initial serologic workup for viral etiologies was negative.  He had follow-up labs in July which showed normal liver enzymes.  Our team evaluated him in the hospital, he has a history of hepatitis C in the past which was treated, viral load was negative.  It was thought he may have had perhaps mild shock liver perioperatively versus drug-induced liver injury versus other.  He does have a history of significant alcohol use.  Over years he has drink anywhere from half to a full fifth of whiskey per day.  He reviewed this history extensively with me, has been in rehab multiple times.  He states he had been drinking prior to his ED visit and hospitalization.  He has had problems with anxiety/depression as well.  He is followed by a therapist for these issues.  He is actively trying to go back to rehab and plans to do this in [place] when he can in the upcoming months.  He is currently drinking about 3-4 liquor drinks per day.  On imaging he has had no evidence of cirrhosis, his platelet counts are normal.  He reminds me he has cardiomyopathy, followed by cardiology long-term.  He had an echocardiogram in August showing EF of 25 to 30% with grade 1 diastolic dysfunction.  Reportedly alcohol cardiomyopathy.  He has had a prior colonoscopy which I performed for him in 2021, multiple adenomas removed, largest was 1 cm in size.  He denies problems with his bowels at  this time.  He knows he is overdue for colonoscopy.    6/3 -- AST 630/ ALT 302/ Alk phos 196. T bili 1.0 6/4--  AST 118/ ALT 278/ Alk phos 255. T bili 0.9 Hgb 9.5, stable Platelets 411 Heb B surface antigen nonreactive Hep A IgM nonreactive Hep B C IgM non reactive HCV RNA Negative  Colonoscopy 03/2019 with Dr. Adela Lank for history of polyps and father with colon cancer: 4mm tubular adenoma in cecum, 10mm tubular adenoma in cecum, diverticulosis in ascending colon, Four 3-19mm tubular adenoma in ascending colon, four 3-50mm tubular adenomas in transverse colon, one 3mm tubular adenomas in descending, internal hemorrhoids.    Past Medical History:  Diagnosis Date   Acetabulum fracture, left (HCC) 06/30/2022   Alcohol use disorder, severe, in early remission (HCC) 03/30/2018   Allergy    Anxiety    Bipolar disorder, in partial remission, most recent episode hypomanic (HCC) 03/30/2018   [facility]   Bleeding internal hemorrhoids 09/12/2013   Cannabis dependence (HCC) 07/13/2018   Daily use   Cervical disc disease 11/20/2014   Chronic anxiety 07/13/2018   Chronic systolic (congestive) heart failure (HCC)    Complication of anesthesia    Depression    Dyslipidemia    GERD (gastroesophageal reflux disease)    Hernia, inguinal, bilateral 02/24/2011   High ankle sprain of right lower  extremity 01/16/2015   History of hiatal hernia    Hx of hepatitis C 10/05/2017   -treated in 2019 -hepatology recommended no further surveillance needed except for LFTs with labs and see hepatologist if elevated   Hypertension    Lipids abnormal 09/29/2013   Pneumonia    PONV (postoperative nausea and vomiting)    Vitamin D deficiency 07/01/2022     Past Surgical History:  Procedure Laterality Date   ANTERIOR CRUCIATE LIGAMENT REPAIR  2010   BIOPSY  03/13/2019   Procedure: BIOPSY;  Surgeon: Benancio Deeds, MD;  Location: [facility];  Service:  Gastroenterology;;   COLONOSCOPY N/A 03/13/2019   per Dr. Adela Lank, adenomatous polyps, repeat in 3 yrs   INGUINAL HERNIA REPAIR  02/23/2012   Procedure: LAPAROSCOPIC BILATERAL INGUINAL HERNIA REPAIR;  Surgeon: Kandis Cocking, MD;  Location: [facility];  Service: General;  Laterality: Bilateral;  Laparoscopic Bilateral Inguinal Hernia Repair with mesh   INSERTION OF MESH  02/23/2012   Procedure: INSERTION OF MESH;  Surgeon: Kandis Cocking, MD;  Location: [facility];  Service: General;  Laterality: N/A;   NOSE SURGERY  [id number],[id number]   ORIF ACETABULAR FRACTURE Left 06/30/2022   Procedure: OPEN REDUCTION INTERNAL FIXATION (ORIF) ACETABULAR FRACTURE;  Surgeon: Myrene Galas, MD;  Location: [facility];  Service: Orthopedics;  Laterality: Left;   POLYPECTOMY  03/13/2019   Procedure: POLYPECTOMY;  Surgeon: Benancio Deeds, MD;  Location: [facility];  Service: Gastroenterology;;   Family History  Problem Relation Age of Onset   Hypertension Mother    Atrial fibrillation Mother    Cancer Father    Hypertension Father    Leukemia Father 18   Prostate cancer Father 31   Diabetes Father    Diabetes Brother    Other Brother        growth on finger   Learning disabilities Paternal Aunt    Lung cancer Maternal Grandmother    Heart attack Maternal Grandfather    Congestive Heart Failure Paternal Grandmother    Colon cancer Neg Hx    Social History   Tobacco Use   Smoking status: Some Days    Types: E-cigarettes   Smokeless tobacco: Never  Vaping Use   Vaping status: Former   Substances: CBD  Substance Use Topics   Alcohol use: Yes    Alcohol/week: 4.0 standard drinks of alcohol    Types: 4 Shots of liquor per week    Comment: (recovering alcoholic) occassioanl drink - drank one double last night, did not drink Sunday   Drug use: Yes    Frequency: 2.0 times per week    Types: Marijuana   Current Outpatient Medications  Medication Sig Dispense Refill   amLODipine (NORVASC) 10 MG tablet TAKE  1 TABLET BY MOUTH EVERY DAY 90 tablet 0   buPROPion (WELLBUTRIN XL) 300 MG 24 hr tablet Take 1 tablet (300 mg total) by mouth daily. 90 tablet 0   busPIRone (BUSPAR) 5 MG tablet TAKE 1 TABLET BY MOUTH TWICE A DAY 180 tablet 1   docusate sodium (COLACE) 100 MG capsule Take 100 mg by mouth daily.     FLUoxetine (PROZAC) 20 MG capsule Take 20 mg by mouth daily.     folic acid (FOLVITE) 1 MG tablet Take 1 tablet (1 mg total) by mouth daily. 90 tablet 3   gabapentin (NEURONTIN) 100 MG capsule Take 100 mg by mouth 2 (two) times daily.     losartan-hydrochlorothiazide (HYZAAR) 100-25 MG tablet TAKE 1 TABLET BY  MOUTH EVERY DAY 90 tablet 0   Magnesium Oxide 400 MG CAPS Take 1 capsule (400 mg total) by mouth 2 (two) times daily. 180 capsule 3   Omeprazole 20 MG TBEC Take 20 mg by mouth daily.      potassium chloride (KLOR-CON M) 10 MEQ tablet Take 1 tablet (10 mEq total) by mouth daily. 90 tablet 3   pravastatin (PRAVACHOL) 40 MG tablet TAKE 1 TABLET BY MOUTH EVERY DAY 90 tablet 0   metoprolol succinate (TOPROL-XL) 25 MG 24 hr tablet Take 1 tablet (25 mg total) by mouth daily. 30 tablet 5   No current facility-administered medications for this visit.   Allergies  Allergen Reactions   Hydrocodone     Can tolerate     Review of Systems: All systems reviewed and negative except where noted in HPI.    ECHOCARDIOGRAM COMPLETE  Result Date: 09/28/2022    ECHOCARDIOGRAM REPORT   Patient Name:   MAKAIO GUNNERSON Coil Date of Exam: 09/28/2022 Medical Rec #:  [id number]           Height:       69.0 in Accession #:    [id number]          Weight:       168.8 lb Date of Birth:  11-26-1975           BSA:          1.922 m Patient Age:    47 years            BP:           115/76 mmHg Patient Gender: M                   HR:           89 bpm. Exam Location:  Outpatient Procedure: 2D Echo, 3D Echo, Cardiac Doppler, Color Doppler and Strain Analysis Indications:    I50.1 Left ventricular failure  History:        Patient  has prior history of Echocardiogram examinations, most                 recent 09/05/2018. Cardiomyopathy and CHF, Abnormal ECG,                 Arrythmias:LBBB; Risk Factors:Dyslipidemia and Hypertension.                 ETOH.  Sonographer:    Sheralyn Boatman RDCS Referring Phys: [id number] KARDIE TOBB IMPRESSIONS  1. Left ventricular ejection fraction, by estimation, is 25 to 30%. The left ventricle has severely decreased function. The left ventricle demonstrates regional wall motion abnormalities (see scoring diagram/findings for description). The left ventricular internal cavity size was moderately dilated. There is mild left ventricular hypertrophy. Left ventricular diastolic parameters are consistent with Grade I diastolic dysfunction (impaired relaxation). The average left ventricular global longitudinal strain is -9.9 %. The global longitudinal strain is normal.  2. Right ventricular systolic function is normal. The right ventricular size is normal.  3. Left atrial size was mildly dilated.  4. Right atrial size was mildly dilated.  5. The mitral valve is grossly normal. Trivial mitral valve regurgitation. No evidence of mitral stenosis.  6. The aortic valve is tricuspid. Aortic valve regurgitation is not visualized. No aortic stenosis is present.  7. The inferior vena cava is normal in size with greater than 50% respiratory variability, suggesting right atrial pressure of 3 mmHg. Comparison(s): Prior images reviewed side by side. FINDINGS  Left Ventricle: Left ventricular ejection fraction, by estimation, is 25 to 30%. The left ventricle has severely decreased function. The left ventricle demonstrates regional wall motion abnormalities. The average left ventricular global longitudinal strain is -9.9 %. The global longitudinal strain is normal. 3D ejection fraction reviewed and evaluated as part of the interpretation. Alternate measurement of EF is felt to be most reflective of LV function. The left ventricular internal  cavity size was  moderately dilated. There is mild left ventricular hypertrophy. Abnormal (paradoxical) septal motion, consistent with left bundle branch block. Left ventricular diastolic parameters are consistent with Grade I diastolic dysfunction (impaired relaxation).  LV Wall Scoring: The apical septal segment, apical anterior segment, and apex are akinetic. Right Ventricle: The right ventricular size is normal. No increase in right ventricular wall thickness. Right ventricular systolic function is normal. Left Atrium: Left atrial size was mildly dilated. Right Atrium: Right atrial size was mildly dilated. Pericardium: There is no evidence of pericardial effusion. Mitral Valve: The mitral valve is grossly normal. Trivial mitral valve regurgitation. No evidence of mitral valve stenosis. Tricuspid Valve: The tricuspid valve is normal in structure. Tricuspid valve regurgitation is trivial. No evidence of tricuspid stenosis. Aortic Valve: The aortic valve is tricuspid. Aortic valve regurgitation is not visualized. No aortic stenosis is present. Pulmonic Valve: The pulmonic valve was normal in structure. Pulmonic valve regurgitation is not visualized. No evidence of pulmonic stenosis. Aorta: The aortic root and ascending aorta are structurally normal, with no evidence of dilitation. Venous: The inferior vena cava is normal in size with greater than 50% respiratory variability, suggesting right atrial pressure of 3 mmHg. IAS/Shunts: No atrial level shunt detected by color flow Doppler.  LEFT VENTRICLE PLAX 2D LVIDd:         4.90 cm      Diastology LVIDs:         4.20 cm      LV e' medial:    4.90 cm/s LV PW:         1.20 cm      LV E/e' medial:  9.2 LV IVS:        1.10 cm      LV e' lateral:   9.46 cm/s LVOT diam:     2.20 cm      LV E/e' lateral: 4.8 LV SV:         69 LV SV Index:   36           2D Longitudinal Strain LVOT Area:     3.80 cm     2D Strain GLS Avg:     -9.9 %  LV Volumes (MOD) LV vol d, MOD A2C: 114.0  ml 3D Volume EF: LV vol d, MOD A4C: 117.0 ml 3D EF:        20 % LV vol s, MOD A2C: 81.6 ml  LV EDV:       151 ml LV vol s, MOD A4C: 82.1 ml  LV ESV:       120 ml LV SV MOD A2C:     32.4 ml  LV SV:        30 ml LV SV MOD A4C:     117.0 ml LV SV MOD BP:      34.9 ml RIGHT VENTRICLE            IVC RV S prime:     9.03 cm/s  IVC diam: 1.50 cm TAPSE (M-mode): 1.7 cm LEFT ATRIUM  Index        RIGHT ATRIUM           Index LA diam:      3.70 cm 1.92 cm/m   RA Area:     13.60 cm LA Vol (A2C): 38.2 ml 19.87 ml/m  RA Volume:   37.80 ml  19.66 ml/m LA Vol (A4C): 40.1 ml 20.86 ml/m  AORTIC VALVE LVOT Vmax:   103.00 cm/s LVOT Vmean:  66.100 cm/s LVOT VTI:    0.182 m  AORTA Ao Root diam: 2.60 cm Ao Asc diam:  3.60 cm MITRAL VALVE MV Area (PHT): 4.21 cm    SHUNTS MV Decel Time: 180 msec    Systemic VTI:  0.18 m MV E velocity: 45.10 cm/s  Systemic Diam: 2.20 cm MV A velocity: 73.70 cm/s MV E/A ratio:  0.61 Donato Schultz MD Electronically signed by Donato Schultz MD Signature Date/Time: 09/28/2022/11:12:46 AM    Final     Physical Exam: BP 104/64   Pulse 97   Ht 5\' 9"  (1.753 m)   Wt 173 lb 6.4 oz (78.7 kg)   BMI 25.61 kg/m  Constitutional: Pleasant,well-developed, male in no acute distress. Neurological: Alert and oriented to person place and time. Psychiatric: Normal mood and affect. Behavior is normal.   ASSESSMENT: 47 y.o. male here for assessment of the following  1. Elevated liver enzymes   2. Alcohol dependence with unspecified alcohol-induced disorder (HCC)   3. Cardiomyopathy, alcoholic (HCC)    As above, AST greater than ALT elevation while in the hospital for hip fracture.  He was drinking alcohol prior to hospitalization although his AST is bit higher than alcohol which caused by itself.  Possible he had some mild ischemic injury during the perioperative period.  He really denies any new medications around the time in regards to question of drug-induced liver injury.  It is also possible he  could have had creatine kinase elevation in the setting of a fracture/extremity injury which could have caused elevated liver enzymes.  Creatine kinase was never checked during that time of hospitalization.  Labs in July showed normalization of his liver enzymes.  We discussed his alcohol use at length today.  He has really struggled with sobriety, has been to rehab a few times and is trying to get to rehab again at a program in [place] that he has been approved for.  He is trying to coordinate logistics of this.  He is working with therapist in regards to this and his anxiety and depression.  I discussed risks of alcohol with him to include cirrhosis of the liver.  Fortunately there is no evidence of cirrhosis on imaging and platelets are normal, but he is at risk for that with continued alcohol use.  He understands the risks of alcohol use and again is trying to do his best to get to rehab and get off alcohol.  I will recheck his liver enzymes today to make sure okay, now 2 months after his last check.  Otherwise we discussed that he is overdue for surveillance colonoscopy.  He does have a history of cardiomyopathy, followed by cardiology, his case will need to be done in the hospital for anesthesia support given his low ejection fraction and risk for anesthesia.  He understands this.  Currently we are booking into December as next available opening for elective/routine procedures.  If we have a cancellation in the interim he can be contacted to be done sooner.  He agrees   PLAN: -  LFTs today to trend and make sure stable - discussed EtOh abuse at length - recommended rehab, he is hoping to go to [place] for this. Counseled on risks for cirrhosis. Will continue to see his therapist - follow up with cardiology for cardiomyopathy - schedule colonoscopy at hospital - next available in December, will contact him to do it sooner if any cancellations in the schedule  Harlin Rain, MD [facility]

## 2022-10-21 NOTE — Patient Instructions (Addendum)
_______________________________________________________  If your blood pressure at your visit was 140/90 or greater, please contact your primary care physician to follow up on this.  _______________________________________________________  If you are age 47 or older, your body mass index should be between 23-30. Your Body mass index is 25.61 kg/m. If this is out of the aforementioned range listed, please consider follow up with your Primary Care Provider.  If you are age 45 or younger, your body mass index should be between 19-25. Your Body mass index is 25.61 kg/m. If this is out of the aformentioned range listed, please consider follow up with your Primary Care Provider.   ________________________________________________________  The [facility] providers would like to encourage you to use [facility] to communicate with providers for non-urgent requests or questions.  Due to long hold times on the telephone, sending your provider a message by [facility] may be a faster and more efficient way to get a response.  Please allow 48 business hours for a response.  Please remember that this is for non-urgent requests.  _______________________________________________________  Bonita Quin have been scheduled for a colonoscopy. Please follow written instructions given to you at your visit today.   Please pick up your prep supplies at the pharmacy within the next 1-3 days.  If you use inhalers (even only as needed), please bring them with you on the day of your procedure.  DO NOT TAKE 7 DAYS PRIOR TO TEST- Trulicity (dulaglutide) Ozempic, Wegovy (semaglutide) Mounjaro (tirzepatide) Bydureon Bcise (exanatide extended release)  DO NOT TAKE 1 DAY PRIOR TO YOUR TEST Rybelsus (semaglutide) Adlyxin (lixisenatide) Victoza (liraglutide) Byetta (exanatide) ___________________________________________________________________________  Due to recent changes in healthcare laws, you may see the results of your imaging  and laboratory studies on MyChart before your provider has had a chance to review them.  We understand that in some cases there may be results that are confusing or concerning to you. Not all laboratory results come back in the same time frame and the provider may be waiting for multiple results in order to interpret others.  Please give [name] 48 hours in order for your provider to thoroughly review all the results before contacting the office for clarification of your results.   Your provider has requested that you go to the basement level for lab work before leaving today. Press "B" on the elevator. The lab is located at the first door on the left as you exit the elevator.   It was a pleasure to see you today!  Thank you for trusting me with your gastrointestinal care!

## 2022-10-22 ENCOUNTER — Ambulatory Visit: Payer: BC Managed Care – PPO

## 2022-10-22 DIAGNOSIS — F1029 Alcohol dependence with unspecified alcohol-induced disorder: Secondary | ICD-10-CM | POA: Diagnosis not present

## 2022-10-22 DIAGNOSIS — F411 Generalized anxiety disorder: Secondary | ICD-10-CM | POA: Diagnosis not present

## 2022-10-22 DIAGNOSIS — F332 Major depressive disorder, recurrent severe without psychotic features: Secondary | ICD-10-CM | POA: Diagnosis not present

## 2022-10-26 ENCOUNTER — Ambulatory Visit: Payer: BC Managed Care – PPO

## 2022-10-26 DIAGNOSIS — R079 Chest pain, unspecified: Secondary | ICD-10-CM | POA: Diagnosis not present

## 2022-10-26 DIAGNOSIS — F10129 Alcohol abuse with intoxication, unspecified: Secondary | ICD-10-CM | POA: Diagnosis not present

## 2022-11-02 DIAGNOSIS — F4310 Post-traumatic stress disorder, unspecified: Secondary | ICD-10-CM | POA: Diagnosis not present

## 2022-11-02 DIAGNOSIS — F1020 Alcohol dependence, uncomplicated: Secondary | ICD-10-CM | POA: Diagnosis not present

## 2022-11-02 DIAGNOSIS — Z569 Unspecified problems related to employment: Secondary | ICD-10-CM | POA: Diagnosis not present

## 2022-11-02 DIAGNOSIS — F332 Major depressive disorder, recurrent severe without psychotic features: Secondary | ICD-10-CM | POA: Diagnosis not present

## 2022-11-02 DIAGNOSIS — F411 Generalized anxiety disorder: Secondary | ICD-10-CM | POA: Diagnosis not present

## 2022-11-03 DIAGNOSIS — F332 Major depressive disorder, recurrent severe without psychotic features: Secondary | ICD-10-CM | POA: Diagnosis not present

## 2022-11-03 DIAGNOSIS — F1020 Alcohol dependence, uncomplicated: Secondary | ICD-10-CM | POA: Diagnosis not present

## 2022-11-04 DIAGNOSIS — F332 Major depressive disorder, recurrent severe without psychotic features: Secondary | ICD-10-CM | POA: Diagnosis not present

## 2022-11-04 DIAGNOSIS — F1020 Alcohol dependence, uncomplicated: Secondary | ICD-10-CM | POA: Diagnosis not present

## 2022-11-05 DIAGNOSIS — F1020 Alcohol dependence, uncomplicated: Secondary | ICD-10-CM | POA: Diagnosis not present

## 2022-11-05 DIAGNOSIS — F332 Major depressive disorder, recurrent severe without psychotic features: Secondary | ICD-10-CM | POA: Diagnosis not present

## 2022-11-06 DIAGNOSIS — F1020 Alcohol dependence, uncomplicated: Secondary | ICD-10-CM | POA: Diagnosis not present

## 2022-11-06 DIAGNOSIS — F332 Major depressive disorder, recurrent severe without psychotic features: Secondary | ICD-10-CM | POA: Diagnosis not present

## 2022-11-09 DIAGNOSIS — F1020 Alcohol dependence, uncomplicated: Secondary | ICD-10-CM | POA: Diagnosis not present

## 2022-11-09 DIAGNOSIS — F332 Major depressive disorder, recurrent severe without psychotic features: Secondary | ICD-10-CM | POA: Diagnosis not present

## 2022-11-10 DIAGNOSIS — F1020 Alcohol dependence, uncomplicated: Secondary | ICD-10-CM | POA: Diagnosis not present

## 2022-11-10 DIAGNOSIS — F332 Major depressive disorder, recurrent severe without psychotic features: Secondary | ICD-10-CM | POA: Diagnosis not present

## 2022-11-11 DIAGNOSIS — F332 Major depressive disorder, recurrent severe without psychotic features: Secondary | ICD-10-CM | POA: Diagnosis not present

## 2022-11-11 DIAGNOSIS — F1020 Alcohol dependence, uncomplicated: Secondary | ICD-10-CM | POA: Diagnosis not present

## 2022-11-12 DIAGNOSIS — I10 Essential (primary) hypertension: Secondary | ICD-10-CM | POA: Diagnosis not present

## 2022-11-12 DIAGNOSIS — I420 Dilated cardiomyopathy: Secondary | ICD-10-CM | POA: Diagnosis not present

## 2022-11-12 DIAGNOSIS — I5042 Chronic combined systolic (congestive) and diastolic (congestive) heart failure: Secondary | ICD-10-CM | POA: Diagnosis not present

## 2022-11-12 DIAGNOSIS — F332 Major depressive disorder, recurrent severe without psychotic features: Secondary | ICD-10-CM | POA: Diagnosis not present

## 2022-11-12 DIAGNOSIS — F1020 Alcohol dependence, uncomplicated: Secondary | ICD-10-CM | POA: Diagnosis not present

## 2022-11-12 DIAGNOSIS — R9431 Abnormal electrocardiogram [ECG] [EKG]: Secondary | ICD-10-CM | POA: Diagnosis not present

## 2022-11-13 DIAGNOSIS — F1020 Alcohol dependence, uncomplicated: Secondary | ICD-10-CM | POA: Diagnosis not present

## 2022-11-13 DIAGNOSIS — F332 Major depressive disorder, recurrent severe without psychotic features: Secondary | ICD-10-CM | POA: Diagnosis not present

## 2022-11-16 DIAGNOSIS — F1020 Alcohol dependence, uncomplicated: Secondary | ICD-10-CM | POA: Diagnosis not present

## 2022-11-16 DIAGNOSIS — F332 Major depressive disorder, recurrent severe without psychotic features: Secondary | ICD-10-CM | POA: Diagnosis not present

## 2022-11-17 DIAGNOSIS — F1020 Alcohol dependence, uncomplicated: Secondary | ICD-10-CM | POA: Diagnosis not present

## 2022-11-17 DIAGNOSIS — F332 Major depressive disorder, recurrent severe without psychotic features: Secondary | ICD-10-CM | POA: Diagnosis not present

## 2022-11-18 DIAGNOSIS — F332 Major depressive disorder, recurrent severe without psychotic features: Secondary | ICD-10-CM | POA: Diagnosis not present

## 2022-11-18 DIAGNOSIS — F1020 Alcohol dependence, uncomplicated: Secondary | ICD-10-CM | POA: Diagnosis not present

## 2022-11-19 DIAGNOSIS — F332 Major depressive disorder, recurrent severe without psychotic features: Secondary | ICD-10-CM | POA: Diagnosis not present

## 2022-11-19 DIAGNOSIS — F1020 Alcohol dependence, uncomplicated: Secondary | ICD-10-CM | POA: Diagnosis not present

## 2022-11-20 DIAGNOSIS — F1020 Alcohol dependence, uncomplicated: Secondary | ICD-10-CM | POA: Diagnosis not present

## 2022-11-20 DIAGNOSIS — F332 Major depressive disorder, recurrent severe without psychotic features: Secondary | ICD-10-CM | POA: Diagnosis not present

## 2022-11-23 DIAGNOSIS — F1020 Alcohol dependence, uncomplicated: Secondary | ICD-10-CM | POA: Diagnosis not present

## 2022-11-23 DIAGNOSIS — F411 Generalized anxiety disorder: Secondary | ICD-10-CM | POA: Diagnosis not present

## 2022-11-23 DIAGNOSIS — F332 Major depressive disorder, recurrent severe without psychotic features: Secondary | ICD-10-CM | POA: Diagnosis not present

## 2022-11-23 DIAGNOSIS — F1320 Sedative, hypnotic or anxiolytic dependence, uncomplicated: Secondary | ICD-10-CM | POA: Diagnosis not present

## 2022-11-24 DIAGNOSIS — F1320 Sedative, hypnotic or anxiolytic dependence, uncomplicated: Secondary | ICD-10-CM | POA: Diagnosis not present

## 2022-11-24 DIAGNOSIS — F411 Generalized anxiety disorder: Secondary | ICD-10-CM | POA: Diagnosis not present

## 2022-11-24 DIAGNOSIS — F1020 Alcohol dependence, uncomplicated: Secondary | ICD-10-CM | POA: Diagnosis not present

## 2022-11-24 DIAGNOSIS — F332 Major depressive disorder, recurrent severe without psychotic features: Secondary | ICD-10-CM | POA: Diagnosis not present

## 2022-11-25 DIAGNOSIS — F332 Major depressive disorder, recurrent severe without psychotic features: Secondary | ICD-10-CM | POA: Diagnosis not present

## 2022-11-25 DIAGNOSIS — F1020 Alcohol dependence, uncomplicated: Secondary | ICD-10-CM | POA: Diagnosis not present

## 2022-11-26 DIAGNOSIS — F332 Major depressive disorder, recurrent severe without psychotic features: Secondary | ICD-10-CM | POA: Diagnosis not present

## 2022-11-26 DIAGNOSIS — F411 Generalized anxiety disorder: Secondary | ICD-10-CM | POA: Diagnosis not present

## 2022-11-26 DIAGNOSIS — F1320 Sedative, hypnotic or anxiolytic dependence, uncomplicated: Secondary | ICD-10-CM | POA: Diagnosis not present

## 2022-11-26 DIAGNOSIS — F1020 Alcohol dependence, uncomplicated: Secondary | ICD-10-CM | POA: Diagnosis not present

## 2022-11-27 DIAGNOSIS — F411 Generalized anxiety disorder: Secondary | ICD-10-CM | POA: Diagnosis not present

## 2022-11-27 DIAGNOSIS — F1020 Alcohol dependence, uncomplicated: Secondary | ICD-10-CM | POA: Diagnosis not present

## 2022-11-27 DIAGNOSIS — F332 Major depressive disorder, recurrent severe without psychotic features: Secondary | ICD-10-CM | POA: Diagnosis not present

## 2022-11-27 DIAGNOSIS — F1320 Sedative, hypnotic or anxiolytic dependence, uncomplicated: Secondary | ICD-10-CM | POA: Diagnosis not present

## 2022-11-30 DIAGNOSIS — F332 Major depressive disorder, recurrent severe without psychotic features: Secondary | ICD-10-CM | POA: Diagnosis not present

## 2022-11-30 DIAGNOSIS — F1020 Alcohol dependence, uncomplicated: Secondary | ICD-10-CM | POA: Diagnosis not present

## 2022-12-01 DIAGNOSIS — F1020 Alcohol dependence, uncomplicated: Secondary | ICD-10-CM | POA: Diagnosis not present

## 2022-12-01 DIAGNOSIS — F332 Major depressive disorder, recurrent severe without psychotic features: Secondary | ICD-10-CM | POA: Diagnosis not present

## 2022-12-02 DIAGNOSIS — F332 Major depressive disorder, recurrent severe without psychotic features: Secondary | ICD-10-CM | POA: Diagnosis not present

## 2022-12-02 DIAGNOSIS — F1020 Alcohol dependence, uncomplicated: Secondary | ICD-10-CM | POA: Diagnosis not present

## 2022-12-03 DIAGNOSIS — F1020 Alcohol dependence, uncomplicated: Secondary | ICD-10-CM | POA: Diagnosis not present

## 2022-12-03 DIAGNOSIS — F332 Major depressive disorder, recurrent severe without psychotic features: Secondary | ICD-10-CM | POA: Diagnosis not present

## 2022-12-04 DIAGNOSIS — F1020 Alcohol dependence, uncomplicated: Secondary | ICD-10-CM | POA: Diagnosis not present

## 2022-12-04 DIAGNOSIS — F332 Major depressive disorder, recurrent severe without psychotic features: Secondary | ICD-10-CM | POA: Diagnosis not present

## 2022-12-07 DIAGNOSIS — F1020 Alcohol dependence, uncomplicated: Secondary | ICD-10-CM | POA: Diagnosis not present

## 2022-12-07 DIAGNOSIS — F332 Major depressive disorder, recurrent severe without psychotic features: Secondary | ICD-10-CM | POA: Diagnosis not present

## 2022-12-08 DIAGNOSIS — F1020 Alcohol dependence, uncomplicated: Secondary | ICD-10-CM | POA: Diagnosis not present

## 2022-12-08 DIAGNOSIS — F332 Major depressive disorder, recurrent severe without psychotic features: Secondary | ICD-10-CM | POA: Diagnosis not present

## 2022-12-09 DIAGNOSIS — F332 Major depressive disorder, recurrent severe without psychotic features: Secondary | ICD-10-CM | POA: Diagnosis not present

## 2022-12-09 DIAGNOSIS — F1020 Alcohol dependence, uncomplicated: Secondary | ICD-10-CM | POA: Diagnosis not present

## 2022-12-10 DIAGNOSIS — Z01810 Encounter for preprocedural cardiovascular examination: Secondary | ICD-10-CM | POA: Diagnosis not present

## 2022-12-10 DIAGNOSIS — I5042 Chronic combined systolic (congestive) and diastolic (congestive) heart failure: Secondary | ICD-10-CM | POA: Diagnosis not present

## 2022-12-10 DIAGNOSIS — R9431 Abnormal electrocardiogram [ECG] [EKG]: Secondary | ICD-10-CM | POA: Diagnosis not present

## 2022-12-10 DIAGNOSIS — I420 Dilated cardiomyopathy: Secondary | ICD-10-CM | POA: Diagnosis not present

## 2022-12-11 DIAGNOSIS — F1020 Alcohol dependence, uncomplicated: Secondary | ICD-10-CM | POA: Diagnosis not present

## 2022-12-11 DIAGNOSIS — F332 Major depressive disorder, recurrent severe without psychotic features: Secondary | ICD-10-CM | POA: Diagnosis not present

## 2022-12-23 DIAGNOSIS — I420 Dilated cardiomyopathy: Secondary | ICD-10-CM | POA: Diagnosis not present

## 2022-12-23 DIAGNOSIS — R9431 Abnormal electrocardiogram [ECG] [EKG]: Secondary | ICD-10-CM | POA: Diagnosis not present

## 2022-12-23 DIAGNOSIS — I5042 Chronic combined systolic (congestive) and diastolic (congestive) heart failure: Secondary | ICD-10-CM | POA: Diagnosis not present

## 2022-12-23 DIAGNOSIS — Z01810 Encounter for preprocedural cardiovascular examination: Secondary | ICD-10-CM | POA: Diagnosis not present

## 2023-01-01 ENCOUNTER — Other Ambulatory Visit: Payer: Self-pay | Admitting: Family Medicine

## 2023-01-04 ENCOUNTER — Telehealth: Payer: Self-pay | Admitting: Gastroenterology

## 2023-01-04 NOTE — Telephone Encounter (Signed)
Inbound call from patient mom requesting to cancel patient hospital procedure due to patient being located in [place] at the moment.   Mom Thomas Williamson requesting additional information as well as a nurse f/u call. However I did express that she is not on patient DPR form recently signed in 2024. Mom advised understanding.    Please advise.

## 2023-01-04 NOTE — Telephone Encounter (Signed)
Colonoscopy procedure originally scheduled at [facility] for 01/19/23 has been cancelled at this time. Patient has been returned to the hospital waiting list.

## 2023-01-04 NOTE — Telephone Encounter (Signed)
Thanks Dottie, sorry to hear this.  If he is out of the [place] I would take him off the schedule and replace him with someone on the wait list, sounds like he will not be ready for a procedure next week. I will keep him on our list and send you a note about who is in need of using that spot. Thanks

## 2023-01-04 NOTE — Telephone Encounter (Signed)
Dr Adela Lank,   Patient is on your schedule for surveillence colonoscopy at the hospital on 01/19/23. His mother is calling today indicating that patient is in [place] and will not be back for his procedure. Unfortunately, I am unable to speak with her as she is not on DPR for patient.   I have attempted to reach patient on his cell number but unfortunately had to leave a voicemail. It appears he may be in rehab, so may not have access to his phone at this time.  Do you want me to go ahead and cancel his 01/19/23 hospital colonoscopy with the understanding he may show if he gets out of rehab OR do you want me to leave him on the schedule with the understanding he may no show?

## 2023-01-07 ENCOUNTER — Other Ambulatory Visit: Payer: Self-pay

## 2023-01-07 ENCOUNTER — Telehealth: Payer: Self-pay

## 2023-01-07 ENCOUNTER — Encounter: Payer: Self-pay | Admitting: Gastroenterology

## 2023-01-07 DIAGNOSIS — Z860100 Personal history of colon polyps, unspecified: Secondary | ICD-10-CM

## 2023-01-07 NOTE — Telephone Encounter (Signed)
Called patient and offered him procedure at [facility] on Monday, 1-20th with Dr. Adela Lank. He indicates he is in [place] undergoing treatment for Mental Health and alcohol abuse.  He said he has had a recent ECHO that revealed a EF of 40% but he has not sent [name] that information.  He would prefer to schedule his case at the hospital and "forget that ECHO happened".  I asked him to send [name] the report for his chart but he requested again that we don't use that recent ECHO to move him to the [facility] and instead reschedule him at the hospital.  Patient has been rescheduled at [facility] on 1-20th.  He has been scheduled for a PV on Fri 1-3 at 10:30am. Patient requested appointment info be sent to his MyChart, which has been done

## 2023-01-07 NOTE — Telephone Encounter (Signed)
Okay thanks Jan 

## 2023-01-12 ENCOUNTER — Ambulatory Visit: Payer: BC Managed Care – PPO | Admitting: Cardiology

## 2023-01-18 DIAGNOSIS — F332 Major depressive disorder, recurrent severe without psychotic features: Secondary | ICD-10-CM | POA: Diagnosis not present

## 2023-01-18 DIAGNOSIS — F411 Generalized anxiety disorder: Secondary | ICD-10-CM | POA: Diagnosis not present

## 2023-01-18 DIAGNOSIS — F1029 Alcohol dependence with unspecified alcohol-induced disorder: Secondary | ICD-10-CM | POA: Diagnosis not present

## 2023-01-18 NOTE — Telephone Encounter (Signed)
Letter mailed to pt.  

## 2023-01-19 ENCOUNTER — Ambulatory Visit (HOSPITAL_COMMUNITY): Admit: 2023-01-19 | Payer: BC Managed Care – PPO | Admitting: Gastroenterology

## 2023-01-19 ENCOUNTER — Encounter (HOSPITAL_COMMUNITY): Payer: Self-pay

## 2023-01-19 SURGERY — COLONOSCOPY WITH PROPOFOL
Anesthesia: Monitor Anesthesia Care

## 2023-01-20 DIAGNOSIS — S73015D Posterior dislocation of left hip, subsequent encounter: Secondary | ICD-10-CM | POA: Diagnosis not present

## 2023-01-20 DIAGNOSIS — S32462D Displaced associated transverse-posterior fracture of left acetabulum, subsequent encounter for fracture with routine healing: Secondary | ICD-10-CM | POA: Diagnosis not present

## 2023-01-21 ENCOUNTER — Ambulatory Visit: Payer: BC Managed Care – PPO | Attending: Orthopedic Surgery

## 2023-01-21 DIAGNOSIS — M25552 Pain in left hip: Secondary | ICD-10-CM | POA: Insufficient documentation

## 2023-01-21 DIAGNOSIS — R262 Difficulty in walking, not elsewhere classified: Secondary | ICD-10-CM | POA: Insufficient documentation

## 2023-01-21 DIAGNOSIS — M6281 Muscle weakness (generalized): Secondary | ICD-10-CM | POA: Diagnosis not present

## 2023-01-21 NOTE — Therapy (Signed)
OUTPATIENT PHYSICAL THERAPY LOWER EXTREMITY EVALUATION   Patient Name: Thomas Williamson MRN: [id number] DOB:[birth year], 47 y.o., male Today's Date: 01/21/2023  END OF SESSION:  PT End of Session - 01/21/23 1300     Visit Number 1    Number of Visits 13    Date for PT Re-Evaluation 03/04/23    PT Start Time 1300    PT Stop Time 1343    PT Time Calculation (min) 43 min    Activity Tolerance Patient tolerated treatment well    Behavior During Therapy [facility] for tasks assessed/performed             Past Medical History:  Diagnosis Date   Acetabulum fracture, left (HCC) 06/30/2022   Alcohol use disorder, severe, in early remission (HCC) 03/30/2018   Allergy    Anxiety    Bipolar disorder, in partial remission, most recent episode hypomanic (HCC) 03/30/2018   [facility]   Bleeding internal hemorrhoids 09/12/2013   Cannabis dependence (HCC) 07/13/2018   Daily use   Cervical disc disease 11/20/2014   Chronic anxiety 07/13/2018   Chronic systolic (congestive) heart failure (HCC)    Complication of anesthesia    Depression    Dyslipidemia    GERD (gastroesophageal reflux disease)    Hernia, inguinal, bilateral 02/24/2011   High ankle sprain of right lower extremity 01/16/2015   History of hiatal hernia    Hx of hepatitis C 10/05/2017   -treated in 2019 -hepatology recommended no further surveillance needed except for LFTs with labs and see hepatologist if elevated   Hypertension    Lipids abnormal 09/29/2013   Pneumonia    PONV (postoperative nausea and vomiting)    Vitamin D deficiency 07/01/2022   Past Surgical History:  Procedure Laterality Date   ANTERIOR CRUCIATE LIGAMENT REPAIR  2010   BIOPSY  03/13/2019   Procedure: BIOPSY;  Surgeon: Benancio Deeds, MD;  Location: [facility];  Service: Gastroenterology;;   COLONOSCOPY N/A 03/13/2019   per Dr. Adela Lank, adenomatous polyps, repeat in 3 yrs   INGUINAL HERNIA REPAIR   02/23/2012   Procedure: LAPAROSCOPIC BILATERAL INGUINAL HERNIA REPAIR;  Surgeon: Kandis Cocking, MD;  Location: [facility];  Service: General;  Laterality: Bilateral;  Laparoscopic Bilateral Inguinal Hernia Repair with mesh   INSERTION OF MESH  02/23/2012   Procedure: INSERTION OF MESH;  Surgeon: Kandis Cocking, MD;  Location: [facility];  Service: General;  Laterality: N/A;   NOSE SURGERY  0102,[id number]   ORIF ACETABULAR FRACTURE Left 06/30/2022   Procedure: OPEN REDUCTION INTERNAL FIXATION (ORIF) ACETABULAR FRACTURE;  Surgeon: Myrene Galas, MD;  Location: [facility];  Service: Orthopedics;  Laterality: Left;   POLYPECTOMY  03/13/2019   Procedure: POLYPECTOMY;  Surgeon: Benancio Deeds, MD;  Location: [facility];  Service: Gastroenterology;;   Patient Active Problem List   Diagnosis Date Noted   Abnormal LFTs 07/08/2022   Left foot drop 07/01/2022   Vitamin D deficiency 07/01/2022   Acetabulum fracture, left (HCC) 06/30/2022   Alcohol dependence (HCC) 01/05/2022   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   Alcohol-induced acute pancreatitis without infection or necrosis 12/05/2021   Genetic testing 04/21/2019   Benign neoplasm of colon    Loose stools    History of colonic polyps 03/07/2019   Cardiac left ventricular ejection fraction 30-35 percent 03/07/2019   Alcoholic ketoacidosis 11/18/2018   Dyslipidemia 09/16/2018   Fever blister 09/16/2018   Cardiomyopathy, alcoholic (HCC) 09/16/2018   Left bundle branch block (  LBBB) 09/16/2018   Cannabis dependence (HCC) 07/13/2018   Chronic anxiety 07/13/2018   Bipolar 1 disorder, depressed, full remission (HCC) 03/30/2018   Cervical disc disease 11/20/2014   Hypertension 02/24/2011    PCP: Nelwyn Salisbury, MD  REFERRING PROVIDER: Montez Morita PA-C  REFERRING DIAG:  W09.811B (ICD-10-CM) - Closed fracture of acetabulum with dislocation of hip with delayed healing, left  M21.372 (ICD-10-CM) - Left foot drop    THERAPY DIAG:   Pain in left hip - Plan: PT plan of care cert/re-cert  Muscle weakness (generalized) - Plan: PT plan of care cert/re-cert  Difficulty in walking, not elsewhere classified - Plan: PT plan of care cert/re-cert  Rationale for Evaluation and Treatment: Rehabilitation  ONSET DATE: 06/30/22  SUBJECTIVE:   SUBJECTIVE STATEMENT: Pt is familiar to PT clinic. Returning for f/u for further PT from prior L hip fracture.   PERTINENT HISTORY: Pt returning from "mental health" program in [place]. Reports still dealing with L foot drop. Planning to see foot specialist. Denies L hip pain but still having dorsal pain on his foot like before. Reports walking a lot in [place] but not a lot of strength training. Reports still dealing with his dynamic balance relying on stepping strategy to correct balance, still feels his LLE strength is lesser than RLE. Reports having 2 falls while away. Tripped his L toe carrying his laundry basket one time and then tripping going up the stairs. Does state he was not wearing his ankle brace those two times. Also having trouble with balance when applied to unstable surfaces especially combined with cognitive dual tasking.   PAIN:  Are you having pain? No  PRECAUTIONS: None  RED FLAGS: None   WEIGHT BEARING RESTRICTIONS: No  FALLS:  Has patient fallen in last 6 months? Yes. Number of falls 2  LIVING ENVIRONMENT: Lives with: lives with their family Lives in: House/apartment Stairs: No Has following equipment at home: Dan Humphreys - 2 wheeled  OCCUPATION: Press photographer for disability  PLOF: Independent  PATIENT GOALS: Improve balance and strength   NEXT MD VISIT: N/A  OBJECTIVE:  Note: Objective measures were completed at Evaluation unless otherwise noted.  DIAGNOSTIC FINDINGS: N/A  PATIENT SURVEYS:  FOTO deferred to next session  COGNITION: Overall cognitive status: Within functional limits for tasks assessed     SENSATION: WFL  POSTURE:  weight shift right  PALPATION: Mild TTP at LLE 1st MTP joint  LOWER EXTREMITY ROM:   AROM/PROM Right eval Left eval  Hip flexion 120 112  Hip extension 12 4  Hip abduction    Hip adduction    Hip internal rotation 40 40  Hip external rotation 45 42  Knee flexion    Knee extension    Ankle dorsiflexion 10 ~60-70 deg in plantar flexion 0 PROM  Ankle plantarflexion    Ankle inversion    Ankle eversion     (Blank rows = not tested)  LOWER EXTREMITY MMT:  MMT Right eval Left eval  Hip flexion 5 4  Hip extension 4 3+  Hip abduction 4 3+  Hip adduction    Hip internal rotation 5 5  Hip external rotation 5 5  Knee flexion 5 5  Knee extension 5 5  Ankle dorsiflexion 5 0  Ankle plantarflexion    Ankle inversion    Ankle eversion     (Blank rows = not tested)  LOWER EXTREMITY SPECIAL TESTS:  NA  FUNCTIONAL TESTS:  30 second STS test: 13 reps  FGA: 26/30  Single leg 1 RM leg press R/L: 115 #/85# MiniBesTest: Deferred to next session  GAIT: Distance walked: 10 meters Assistive device utilized: None Level of assistance: Complete Independence Comments: Normalized, reciprocal gait except for L ankle drop foot with no eccentric control with initial contact.                                                                                                                                TREATMENT DATE: 01/21/23 Provided initial HEP    PATIENT EDUCATION:  Education details: HEP (reps/sets/frequency), POC, Prognosis Person educated: Patient Education method: Explanation, Demonstration, and Handouts Education comprehension: verbalized understanding and needs further education  HOME EXERCISE PROGRAM: Access Code: [email or web address] URL: [email or web address] Date: 01/21/2023 Prepared by: Ronnie Derby  Exercises - Mini Squat  - 1 x daily - 4-5 x weekly - 3 sets - 8 reps - Standing Hip Abduction with Resistance at Ankles and Counter Support  - 1 x daily - 4-5 x  weekly - 3 sets - 8 reps  ASSESSMENT:  CLINICAL IMPRESSION: Patient is a 47 y.o. male who was seen today for physical therapy evaluation and treatment for Ongoing chronic L hip fracture. Pt presents with deficits in L hip extension/ abduction weakness and continuing L foot drop increasing pt's falls risk without bracing. Pt also remains with limitations in L hip AROM especially hip extension AROM which will decrease pt's power output abilities with gait and primarily STS transfers or floor to standing transfers. Pt did score promising on FGA scoring 26/30 indicative of low falls risk however PT to plan to perform MiniBesTest as this may have better specificity to better understand pt's subjective reports dynamic, higher level balance deficits such as stepping strategy, dual tasking, inclined surfaces, etc. These deficits are increasing pt's risk for falls and future injury thus will benefit from skilled PT services to address these impairments and maximize return to PLOF.   OBJECTIVE IMPAIRMENTS: Abnormal gait, decreased mobility, decreased ROM, and decreased strength.   ACTIVITY LIMITATIONS: squatting, stairs, and locomotion level  PARTICIPATION LIMITATIONS: community activity and occupation  PERSONAL FACTORS: Age, Fitness, Past/current experiences, and Time since onset of injury/illness/exacerbation are also affecting patient's functional outcome.   REHAB POTENTIAL: Excellent  CLINICAL DECISION MAKING: Stable/uncomplicated  EVALUATION COMPLEXITY: Low   GOALS: Goals reviewed with patient? No  SHORT TERM GOALS: Target date: 02/11/23 Pt will be independent with HEP to improve hip AROM and strength to return to PLOF. Baseline: 01/21/23: provided HEP Goal status: INITIAL  2.  Pt will demonstrate improved L hip extension equal to R hip to demo significant improvement in ability to produce greater power production with transfers and gait.  Baseline: 01/21/23: R/L: 12/4 degrees Goal status:  INITIAL  LONG TERM GOALS: Target date: 03/04/23  Pt will improve FOTO to target score to demonstrate clinically significant improvement in functional mobility.  Baseline: 01/21/23: deferred to next session Goal status: INITIAL  2.  Pt will improve  30 sec STS to age matched norms to demonstrate improve LE power production and endurance for transfers and gait. Baseline: 01/21/23: 13 reps Goal status: INITIAL  3.  Pt will improve LLE 1RM leg press to RLE to demo return LLE symmetrical strength to non involved limb.  Baseline: 01/21/23: 115#/85# R/L Goal status: INITIAL  4.  Pt will improve MiniBES Test by at least 4 points to demonstrate significant improvement in reduced falls risk with dynamic balance activities in community.  Baseline: 01/21/23: Deferred to next session Goal status: INITIAL   PLAN:  PT FREQUENCY: 1-2x/week  PT DURATION: 6 weeks  PLANNED INTERVENTIONS: 97164- PT Re-evaluation, 97110-Therapeutic exercises, 97530- Therapeutic activity, 97112- Neuromuscular re-education, 97535- Self Care, 21308- Manual therapy, [phone]- Gait training, Patient/Family education, Balance training, Stair training, Dry Needling, Joint mobilization, Cryotherapy, and Moist heat  PLAN FOR NEXT SESSION: FOTO, MiniBESTest, LLE strengthening. Address dynamic balance impairments per minibestest.   Delphia Grates. Fairly IV, PT, DPT Physical Therapist- [place]  [facility]  01/21/2023, 8:30 PM

## 2023-01-24 ENCOUNTER — Other Ambulatory Visit: Payer: Self-pay | Admitting: Family Medicine

## 2023-01-24 DIAGNOSIS — I10 Essential (primary) hypertension: Secondary | ICD-10-CM

## 2023-01-28 ENCOUNTER — Ambulatory Visit: Payer: BC Managed Care – PPO

## 2023-02-01 ENCOUNTER — Encounter: Payer: Self-pay | Admitting: Family Medicine

## 2023-02-01 ENCOUNTER — Telehealth: Payer: Self-pay

## 2023-02-01 ENCOUNTER — Ambulatory Visit (INDEPENDENT_AMBULATORY_CARE_PROVIDER_SITE_OTHER): Payer: BC Managed Care – PPO | Admitting: Family Medicine

## 2023-02-01 VITALS — BP 110/78 | HR 82 | Temp 98.0°F | Wt 193.0 lb

## 2023-02-01 DIAGNOSIS — J029 Acute pharyngitis, unspecified: Secondary | ICD-10-CM | POA: Diagnosis not present

## 2023-02-01 DIAGNOSIS — J069 Acute upper respiratory infection, unspecified: Secondary | ICD-10-CM

## 2023-02-01 DIAGNOSIS — R059 Cough, unspecified: Secondary | ICD-10-CM

## 2023-02-01 LAB — POC COVID19 BINAXNOW: SARS Coronavirus 2 Ag: NEGATIVE

## 2023-02-01 LAB — POCT RAPID STREP A (OFFICE): Rapid Strep A Screen: NEGATIVE

## 2023-02-01 MED ORDER — HYDROCODONE BIT-HOMATROP MBR 5-1.5 MG/5ML PO SOLN: 5.0000 mL | ORAL | 0 refills | Status: DC | PRN

## 2023-02-01 NOTE — Progress Notes (Signed)
   Subjective:    Patient ID: Thomas Williamson, male    DOB: [birth year], 47 y.o.   MRN: [id number]  HPI Here for one week of a slight ST and a dry cough. No fever or sinus congestion. Robitussin has not helped.    Review of Systems  Constitutional: Negative.   HENT:  Positive for sore throat. Negative for congestion, ear pain, postnasal drip and sinus pressure.   Eyes: Negative.   Respiratory:  Positive for cough. Negative for shortness of breath and wheezing.        Objective:   Physical Exam Constitutional:      Appearance: Normal appearance.  HENT:     Right Ear: Tympanic membrane, ear canal and external ear normal.     Left Ear: Tympanic membrane, ear canal and external ear normal.     Nose: Nose normal.     Mouth/Throat:     Pharynx: Oropharynx is clear.  Eyes:     Conjunctiva/sclera: Conjunctivae normal.  Pulmonary:     Effort: Pulmonary effort is normal.     Breath sounds: Normal breath sounds.  Lymphadenopathy:     Cervical: No cervical adenopathy.  Neurological:     Mental Status: He is alert.           Assessment & Plan:  Viral URI. He can use Hycodan as needed until this resolves.  Gershon Crane, MD

## 2023-02-01 NOTE — Telephone Encounter (Signed)
Pt has appointment with Dr Clent Ridges today at 4 pm for this problem

## 2023-02-05 ENCOUNTER — Ambulatory Visit (AMBULATORY_SURGERY_CENTER): Payer: BC Managed Care – PPO

## 2023-02-05 ENCOUNTER — Telehealth: Payer: Self-pay | Admitting: *Deleted

## 2023-02-05 ENCOUNTER — Ambulatory Visit: Payer: BC Managed Care – PPO

## 2023-02-05 VITALS — Ht 69.0 in | Wt 193.6 lb

## 2023-02-05 DIAGNOSIS — Z860100 Personal history of colon polyps, unspecified: Secondary | ICD-10-CM

## 2023-02-05 MED ORDER — BENZONATATE 200 MG PO CAPS: 200.0000 mg | ORAL_CAPSULE | Freq: Four times a day (QID) | ORAL | 0 refills | Status: DC | PRN

## 2023-02-05 NOTE — Telephone Encounter (Signed)
 NO refills on the cough syrup. Instead I sent in for Benzonatate pills to use.

## 2023-02-05 NOTE — Telephone Encounter (Signed)
 Copied from CRM [id number]. Topic: Clinical - Medication Question >> Feb 05, 2023  8:53 AM Thomas Williamson wrote: Reason for CRM: patient was prescribed HYDROcodone  bit-homatropine (HYCODAN) 5-1.5 MG/5ML syrup for a cough.He called in today stating that he has a little left however he still has the cough,and would like to know if he could possibly be prescribed more to help with his cough?

## 2023-02-05 NOTE — Progress Notes (Signed)
 No egg or soy allergy known to patient  PONV with past sedation with any surgeries or procedures Patient denies ever being told they had issues or difficulty with intubation  No FH of Malignant Hyperthermia Pt is not on diet pills Pt is not on  home 02  Pt is not on blood thinners  Pt denies issues with constipation   No A fib or A flutter Have any cardiac testing pending-- follow up apt no further testing at this time  Prep: suprep   Patient's chart reviewed by Norleen Schillings CNRA prior to previsit and patient appropriate for the [facility].  Previsit completed and red dot placed by patient's name on their procedure day (on provider's schedule).     PV competed with patient. Prep instructions sent via mychart and home address.

## 2023-02-08 ENCOUNTER — Telehealth: Payer: Self-pay

## 2023-02-08 ENCOUNTER — Encounter: Payer: Self-pay | Admitting: Gastroenterology

## 2023-02-08 NOTE — Telephone Encounter (Signed)
 Called and spoke to patient.  He is scheduled for procedure on 1-20 but said he can move up to 1-9 this Thursday, to fill opening Dr. Mervyn Skeeters has at [facility] at 8:30am, arr at 7am.  He has had his previsit and has prep. New instructions sent to patient via MyChart.

## 2023-02-09 ENCOUNTER — Ambulatory Visit: Payer: BC Managed Care – PPO

## 2023-02-09 NOTE — Anesthesia Preprocedure Evaluation (Addendum)
 Anesthesia Evaluation  Patient identified by MRN, date of birth, ID band Patient awake    Reviewed: Allergy & Precautions, NPO status , Patient's Chart, lab work & pertinent test results, reviewed documented beta blocker date and time   History of Anesthesia Complications (+) PONV and history of anesthetic complications  Airway Mallampati: II  TM Distance: >3 FB Neck ROM: Full    Dental  (+) Dental Advisory Given, Teeth Intact   Pulmonary Current SmokerPatient did not abstain from smoking.   Pulmonary exam normal        Cardiovascular hypertension, Pt. on medications and Pt. on home beta blockers +CHF  Normal cardiovascular exam   '24 TTE - EF 25 to 30%. The left ventricular internal cavity size was moderately dilated. There is mild left ventricular hypertrophy. Grade I diastolic dysfunction (impaired relaxation). Left atrial size was mildly dilated. Right atrial size was mildly dilated. Trivial mitral valve regurgitation.   More recent echo brought in by patient suggests improved EF to 41%     Neuro/Psych  PSYCHIATRIC DISORDERS Anxiety Depression Bipolar Disorder   negative neurological ROS     GI/Hepatic hiatal hernia,GERD  Medicated and Controlled,,(+)     substance abuse  alcohol  use and marijuana use, Hepatitis -, C  Endo/Other  negative endocrine ROS    Renal/GU negative Renal ROS     Musculoskeletal negative musculoskeletal ROS (+)    Abdominal   Peds  Hematology negative hematology ROS (+)   Anesthesia Other Findings   Reproductive/Obstetrics                             Anesthesia Physical Anesthesia Plan  ASA: 3  Anesthesia Plan: MAC   Post-op Pain Management: Minimal or no pain anticipated   Induction:   PONV Risk Score and Plan: 2 and Propofol  infusion and Treatment may vary due to age or medical condition  Airway Management Planned: Natural Airway and Simple Face  Mask  Additional Equipment: None  Intra-op Plan:   Post-operative Plan:   Informed Consent: I have reviewed the patients History and Physical, chart, labs and discussed the procedure including the risks, benefits and alternatives for the proposed anesthesia with the patient or authorized representative who has indicated his/her understanding and acceptance.       Plan Discussed with: CRNA and Anesthesiologist  Anesthesia Plan Comments:         Anesthesia Quick Evaluation

## 2023-02-09 NOTE — Progress Notes (Signed)
 Attempted to obtain medical history for pre op call via telephone, unable to reach at this time. HIPAA compliant voicemail message left requesting return call to pre surgical testing department.

## 2023-02-09 NOTE — Telephone Encounter (Signed)
 Called pt to check on the status pt stated he felt much better the next day and did not need the other medication.

## 2023-02-09 NOTE — Telephone Encounter (Signed)
 Patient rescheduled for 02-11-23. New instructions sent to patient's MyChart.

## 2023-02-10 DIAGNOSIS — F411 Generalized anxiety disorder: Secondary | ICD-10-CM | POA: Diagnosis not present

## 2023-02-10 DIAGNOSIS — F1029 Alcohol dependence with unspecified alcohol-induced disorder: Secondary | ICD-10-CM | POA: Diagnosis not present

## 2023-02-10 DIAGNOSIS — F332 Major depressive disorder, recurrent severe without psychotic features: Secondary | ICD-10-CM | POA: Diagnosis not present

## 2023-02-10 DIAGNOSIS — M25572 Pain in left ankle and joints of left foot: Secondary | ICD-10-CM | POA: Diagnosis not present

## 2023-02-10 DIAGNOSIS — M79672 Pain in left foot: Secondary | ICD-10-CM | POA: Diagnosis not present

## 2023-02-11 ENCOUNTER — Ambulatory Visit (HOSPITAL_COMMUNITY): Payer: BC Managed Care – PPO | Admitting: Anesthesiology

## 2023-02-11 ENCOUNTER — Encounter (HOSPITAL_COMMUNITY): Payer: Self-pay | Admitting: Gastroenterology

## 2023-02-11 ENCOUNTER — Ambulatory Visit (HOSPITAL_COMMUNITY)
Admission: RE | Admit: 2023-02-11 | Discharge: 2023-02-11 | Disposition: A | Discharge status: Home / Self Care | Payer: BC Managed Care – PPO | Attending: Gastroenterology | Admitting: Gastroenterology

## 2023-02-11 ENCOUNTER — Encounter (HOSPITAL_COMMUNITY)
Admission: RE | Disposition: A | Discharge status: Home / Self Care | Payer: Self-pay | Source: Home / Self Care | Attending: Gastroenterology

## 2023-02-11 ENCOUNTER — Other Ambulatory Visit: Payer: Self-pay

## 2023-02-11 DIAGNOSIS — K648 Other hemorrhoids: Secondary | ICD-10-CM | POA: Diagnosis not present

## 2023-02-11 DIAGNOSIS — Z5986 Financial insecurity: Secondary | ICD-10-CM | POA: Diagnosis not present

## 2023-02-11 DIAGNOSIS — Z1211 Encounter for screening for malignant neoplasm of colon: Secondary | ICD-10-CM | POA: Diagnosis not present

## 2023-02-11 DIAGNOSIS — Z860101 Personal history of adenomatous and serrated colon polyps: Secondary | ICD-10-CM

## 2023-02-11 DIAGNOSIS — R748 Abnormal levels of other serum enzymes: Secondary | ICD-10-CM

## 2023-02-11 DIAGNOSIS — Z8249 Family history of ischemic heart disease and other diseases of the circulatory system: Secondary | ICD-10-CM | POA: Insufficient documentation

## 2023-02-11 DIAGNOSIS — Z79899 Other long term (current) drug therapy: Secondary | ICD-10-CM | POA: Diagnosis not present

## 2023-02-11 DIAGNOSIS — I426 Alcoholic cardiomyopathy: Secondary | ICD-10-CM | POA: Diagnosis not present

## 2023-02-11 DIAGNOSIS — F1029 Alcohol dependence with unspecified alcohol-induced disorder: Secondary | ICD-10-CM

## 2023-02-11 DIAGNOSIS — F419 Anxiety disorder, unspecified: Secondary | ICD-10-CM | POA: Diagnosis not present

## 2023-02-11 DIAGNOSIS — D122 Benign neoplasm of ascending colon: Secondary | ICD-10-CM | POA: Diagnosis not present

## 2023-02-11 DIAGNOSIS — F17290 Nicotine dependence, other tobacco product, uncomplicated: Secondary | ICD-10-CM | POA: Diagnosis not present

## 2023-02-11 DIAGNOSIS — Z860100 Personal history of colon polyps, unspecified: Secondary | ICD-10-CM | POA: Diagnosis not present

## 2023-02-11 DIAGNOSIS — F319 Bipolar disorder, unspecified: Secondary | ICD-10-CM | POA: Diagnosis not present

## 2023-02-11 DIAGNOSIS — K635 Polyp of colon: Secondary | ICD-10-CM | POA: Diagnosis not present

## 2023-02-11 DIAGNOSIS — Z800 Family history of malignant neoplasm of digestive organs: Secondary | ICD-10-CM | POA: Diagnosis not present

## 2023-02-11 DIAGNOSIS — K5730 Diverticulosis of large intestine without perforation or abscess without bleeding: Secondary | ICD-10-CM | POA: Diagnosis not present

## 2023-02-11 DIAGNOSIS — I5022 Chronic systolic (congestive) heart failure: Secondary | ICD-10-CM | POA: Insufficient documentation

## 2023-02-11 DIAGNOSIS — I110 Hypertensive heart disease with heart failure: Secondary | ICD-10-CM | POA: Insufficient documentation

## 2023-02-11 HISTORY — PX: COLONOSCOPY WITH PROPOFOL: SHX5780

## 2023-02-11 HISTORY — PX: POLYPECTOMY: SHX5525

## 2023-02-11 LAB — POCT I-STAT, CHEM 8
BUN: 14 mg/dL (ref 6–20)
Calcium, Ion: 1.28 mmol/L (ref 1.15–1.40)
Chloride: 102 mmol/L (ref 98–111)
Creatinine, Ser: 1 mg/dL (ref 0.61–1.24)
Glucose, Bld: 100 mg/dL — ABNORMAL HIGH (ref 70–99)
HCT: 37 % — ABNORMAL LOW (ref 39.0–52.0)
Hemoglobin: 12.6 g/dL — ABNORMAL LOW (ref 13.0–17.0)
Potassium: 4.2 mmol/L (ref 3.5–5.1)
Sodium: 140 mmol/L (ref 135–145)
TCO2: 26 mmol/L (ref 22–32)

## 2023-02-11 SURGERY — COLONOSCOPY WITH PROPOFOL
Anesthesia: Monitor Anesthesia Care

## 2023-02-11 MED ORDER — PROPOFOL 500 MG/50ML IV EMUL
INTRAVENOUS | Status: DC | PRN
  Administered 2023-02-11: 150 ug/kg/min via INTRAVENOUS

## 2023-02-11 MED ORDER — ONDANSETRON HCL 4 MG/2ML IJ SOLN
INTRAMUSCULAR | Status: DC | PRN
  Administered 2023-02-11: 4 mg via INTRAVENOUS

## 2023-02-11 MED ORDER — PROPOFOL 1000 MG/100ML IV EMUL
INTRAVENOUS | Status: AC
  Filled 2023-02-11: qty 100

## 2023-02-11 MED ORDER — SODIUM CHLORIDE 0.9 % IV SOLN: INTRAVENOUS | Status: DC

## 2023-02-11 MED ORDER — PHENYLEPHRINE 80 MCG/ML (10ML) SYRINGE FOR IV PUSH (FOR BLOOD PRESSURE SUPPORT)
PREFILLED_SYRINGE | INTRAVENOUS | Status: DC | PRN
  Administered 2023-02-11: 160 ug via INTRAVENOUS

## 2023-02-11 MED ORDER — PROPOFOL 10 MG/ML IV BOLUS
INTRAVENOUS | Status: DC | PRN
  Administered 2023-02-11: 30 mg via INTRAVENOUS
  Administered 2023-02-11: 40 mg via INTRAVENOUS

## 2023-02-11 MED ORDER — LIDOCAINE 2% (20 MG/ML) 5 ML SYRINGE
INTRAMUSCULAR | Status: DC | PRN
  Administered 2023-02-11: 100 mg via INTRAVENOUS

## 2023-02-11 SURGICAL SUPPLY — 21 items

## 2023-02-11 NOTE — Anesthesia Postprocedure Evaluation (Signed)
 Anesthesia Post Note  Patient: Thomas Williamson  Procedure(s) Performed: COLONOSCOPY WITH PROPOFOL  POLYPECTOMY     Patient location during evaluation: PACU Anesthesia Type: MAC Level of consciousness: awake and alert Pain management: pain level controlled Vital Signs Assessment: post-procedure vital signs reviewed and stable Respiratory status: spontaneous breathing, nonlabored ventilation and respiratory function stable Cardiovascular status: stable and blood pressure returned to baseline Anesthetic complications: no   No notable events documented.  Last Vitals:  Vitals:   02/11/23 0915 02/11/23 0925  BP: 118/76 120/78  Pulse: 82 86  Resp: 17 13  Temp:    SpO2: 97% 99%    Last Pain:  Vitals:   02/11/23 0925  TempSrc:   PainSc: 0-No pain                 Debby FORBES Like

## 2023-02-11 NOTE — Discharge Instructions (Signed)

## 2023-02-11 NOTE — Transfer of Care (Signed)
 Immediate Anesthesia Transfer of Care Note  Patient: Thomas Williamson  Procedure(s) Performed: COLONOSCOPY WITH PROPOFOL  POLYPECTOMY  Patient Location: PACU and Endoscopy Unit  Anesthesia Type:MAC  Level of Consciousness: awake, alert , and oriented  Airway & Oxygen Therapy: Patient Spontanous Breathing and Patient connected to face mask oxygen  Post-op Assessment: Report given to RN and Post -op Vital signs reviewed and stable  Post vital signs: Reviewed and stable  Last Vitals:  Vitals Value Taken Time  BP 90/50 02/11/23 0905  Temp 36.6 C 02/11/23 0905  Pulse 95 02/11/23 0906  Resp 14 02/11/23 0906  SpO2 100 % 02/11/23 0906  Vitals shown include unfiled device data.  Last Pain:  Vitals:   02/11/23 0905  TempSrc: Tympanic  PainSc: Asleep         Complications: No notable events documented.

## 2023-02-11 NOTE — Op Note (Signed)
 [facility] Patient Name: Thomas Williamson Procedure Date: 02/11/2023 MRN: [id number] Attending MD: Elspeth SQUIBB. Leigh , MD, [id number] Date of Birth: [birth year] CSN: [id number] Age: 48 Admit Type: Outpatient Procedure:                Colonoscopy Indications:              High risk colon cancer surveillance: Personal                            history of colonic polyps - last exam 03/2019 -                            multiple adenomas removed, including one advanced                            lesion Providers:                Elspeth P. Leigh, MD, Particia Fischer, RN, Saralyn Greener, Technician Referring MD:              Medicines:                Monitored Anesthesia Care Complications:            No immediate complications. Estimated blood loss:                            Minimal. Estimated Blood Loss:     Estimated blood loss was minimal. Procedure:                Pre-Anesthesia Assessment:                           - Prior to the procedure, a History and Physical                            was performed, and patient medications and                            allergies were reviewed. The patient's tolerance of                            previous anesthesia was also reviewed. The risks                            and benefits of the procedure and the sedation                            options and risks were discussed with the patient.                            All questions were answered, and informed consent                            was obtained. Prior Anticoagulants:  The patient has                            taken no anticoagulant or antiplatelet agents. ASA                            Grade Assessment: III - A patient with severe                            systemic disease. After reviewing the risks and                            benefits, the patient was deemed in satisfactory                            condition to undergo the  procedure.                           After obtaining informed consent, the colonoscope                            was passed under direct vision. Throughout the                            procedure, the patient's blood pressure, pulse, and                            oxygen saturations were monitored continuously. The                            CF-HQ190L ([id number]) Olympus colonoscope was                            introduced through the anus and advanced to the the                            cecum, identified by appendiceal orifice and                            ileocecal valve. The colonoscopy was performed                            without difficulty. The patient tolerated the                            procedure well. The quality of the bowel                            preparation was good. The ileocecal valve,                            appendiceal orifice, and rectum were photographed. Scope In: 8:40:21 AM Scope Out: 8:58:16 AM Scope Withdrawal Time: 0 hours 13 minutes 46 seconds  Total Procedure Duration: 0 hours 17 minutes 55 seconds  Findings:      The perianal and digital rectal examinations were normal.      A 4 mm polyp was found in the ascending colon. The polyp was sessile.       The polyp was removed with a cold snare. Resection and retrieval were       complete.      A few small-mouthed diverticula were found in the sigmoid colon.      Internal hemorrhoids were found during retroflexion. The hemorrhoids       were small.      The exam was otherwise without abnormality. Impression:               - One 4 mm polyp in the ascending colon, removed                            with a cold snare. Resected and retrieved.                           - Diverticulosis in the sigmoid colon.                           - Internal hemorrhoids.                           - The examination was otherwise normal. Moderate Sedation:      No moderate sedation, case performed with  MAC Recommendation:           - Patient has a contact number available for                            emergencies. The signs and symptoms of potential                            delayed complications were discussed with the                            patient. Return to normal activities tomorrow.                            Written discharge instructions were provided to the                            patient.                           - Resume previous diet.                           - Continue present medications.                           - Await pathology results. Anticipate repeat                            colonoscopy in 5 years given burden of polyps  removed on the last exam Procedure Code(s):        --- Professional ---                           [phone], Colonoscopy, flexible; with removal of                            tumor(s), polyp(s), or other lesion(s) by snare                            technique Diagnosis Code(s):        --- Professional ---                           Z86.010, Personal history of colonic polyps                           D12.2, Benign neoplasm of ascending colon                           K64.8, Other hemorrhoids                           K57.30, Diverticulosis of large intestine without                            perforation or abscess without bleeding CPT copyright 2022 American Medical Association. All rights reserved. The codes documented in this report are preliminary and upon coder review may  be revised to meet current compliance requirements. Elspeth P. Konstance Happel, MD 02/11/2023 9:08:59 AM This report has been signed electronically. Number of Addenda: 0

## 2023-02-11 NOTE — H&P (Signed)
 [facility] History and Physical   Primary Care Physician:  Johnny Garnette LABOR, MD   Reason for Procedure:   History of colon polyps  Plan:    colonoscopy     HPI: Thomas Williamson is a 48 y.o. male  here for colonoscopy surveillance - last exam was done in 03/2019 - multiple adenomas, one advanced. Father had colon cancer age 63s.  Patient denies any bowel symptoms at this time. Otherwise feels well without any cardiopulmonary symptoms. He was in [place]  recently for EtOH rehab, while there had cardiac workup. Echocardiogram done he states EF had improved to over 40%. He has a history of CHF, alcohol  related cardiomyopathy, previously EF was 30% but he states has improved with time. He is currently not drinking any alcohol  since going to rehab.   I have discussed risks / benefits of anesthesia and endoscopic procedure with Thomas Williamson and they wish to proceed with the exams as outlined today.    Past Medical History:  Diagnosis Date   Acetabulum fracture, left (HCC) 06/30/2022   Alcohol  use disorder, severe, in early remission (HCC) 03/30/2018   Allergy    Anxiety    Bipolar disorder, in partial remission, most recent episode hypomanic (HCC) 03/30/2018   [facility]   Bleeding internal hemorrhoids 09/12/2013   Cannabis dependence (HCC) 07/13/2018   Daily use   Cervical disc disease 11/20/2014   Chronic anxiety 07/13/2018   Chronic systolic (congestive) heart failure (HCC)    Complication of anesthesia    PONV   Depression    Dyslipidemia    GERD (gastroesophageal reflux disease)    Hernia, inguinal, bilateral 02/24/2011   High ankle sprain of right lower extremity 01/16/2015   History of hiatal hernia    Hx of hepatitis C 10/05/2017   -treated in 2019 -hepatology recommended no further surveillance needed except for LFTs with labs and see hepatologist if elevated   Hypertension    Lipids abnormal 09/29/2013   Pneumonia     PONV (postoperative nausea and vomiting)    Vitamin D  deficiency 07/01/2022    Past Surgical History:  Procedure Laterality Date   ANTERIOR CRUCIATE LIGAMENT REPAIR  2010   BIOPSY  03/13/2019   Procedure: BIOPSY;  Surgeon: Leigh Elspeth SQUIBB, MD;  Location: [facility];  Service: Gastroenterology;;   COLONOSCOPY N/A 03/13/2019   per Dr. Leigh, adenomatous polyps, repeat in 3 yrs   INGUINAL HERNIA REPAIR  02/23/2012   Procedure: LAPAROSCOPIC BILATERAL INGUINAL HERNIA REPAIR;  Surgeon: Alm VEAR Angle, MD;  Location: [facility];  Service: General;  Laterality: Bilateral;  Laparoscopic Bilateral Inguinal Hernia Repair with mesh   INSERTION OF MESH  02/23/2012   Procedure: INSERTION OF MESH;  Surgeon: Alm VEAR Angle, MD;  Location: [facility];  Service: General;  Laterality: N/A;   NOSE SURGERY  [id number]   ORIF ACETABULAR FRACTURE Left 06/30/2022   Procedure: OPEN REDUCTION INTERNAL FIXATION (ORIF) ACETABULAR FRACTURE;  Surgeon: Celena Ozell, MD;  Location: [facility];  Service: Orthopedics;  Laterality: Left;   POLYPECTOMY  03/13/2019   Procedure: POLYPECTOMY;  Surgeon: Leigh Elspeth SQUIBB, MD;  Location: [facility];  Service: Gastroenterology;;    Prior to Admission medications   Medication Sig Start Date End Date Taking? Authorizing Provider  amitriptyline (ELAVIL) 25 MG tablet Take 50 mg by mouth at bedtime. 01/11/23  Yes [provider]  amLODipine  (NORVASC ) 10 MG tablet TAKE 1 TABLET BY MOUTH EVERY DAY 01/25/23  Yes Johnny Garnette  A, MD  ARIPiprazole (ABILIFY) 10 MG tablet Take 10 mg by mouth daily. 11/04/22  Yes [provider]  benzonatate  (TESSALON ) 200 MG capsule Take 1 capsule (200 mg total) by mouth every 6 (six) hours as needed. 02/05/23  Yes Johnny Garnette LABOR, MD  folic acid  (FOLVITE ) 1 MG tablet Take 1 tablet (1 mg total) by mouth daily. 02/19/22 02/14/23 Yes Johnny Garnette LABOR, MD  gabapentin  (NEURONTIN ) 600 MG tablet Take 900 mg by mouth 3 (three) times daily. 12/16/22   Yes [provider]  losartan -hydrochlorothiazide  (HYZAAR) 100-25 MG tablet TAKE 1 TABLET BY MOUTH EVERY DAY 10/07/22  Yes Johnny Garnette LABOR, MD  metoprolol  succinate (TOPROL -XL) 25 MG 24 hr tablet TAKE 1 TABLET (25 MG TOTAL) BY MOUTH DAILY. 01/04/23 07/03/23 Yes Johnny Garnette LABOR, MD  Omeprazole  20 MG TBEC Take 20 mg by mouth daily.    Yes [provider]  pravastatin  (PRAVACHOL ) 40 MG tablet TAKE 1 TABLET BY MOUTH EVERY DAY 10/07/22  Yes Johnny Garnette LABOR, MD  HYDROcodone  bit-homatropine (HYCODAN) 5-1.5 MG/5ML syrup Take 5 mLs by mouth every 4 (four) hours as needed. Patient not taking: Reported on 02/05/2023 02/01/23   Johnny Garnette LABOR, MD    Current Facility-Administered Medications  Medication Dose Route Frequency Provider Last Rate Last Admin   0.9 %  sodium chloride  infusion   Intravenous Continuous Saadiya Wilfong, Elspeth SQUIBB, MD        Allergies as of 01/07/2023 - Review Complete 10/21/2022  Allergen Reaction Noted   Hydrocodone   02/19/2012    Family History  Problem Relation Age of Onset   Hypertension Mother    Atrial fibrillation Mother    Cancer Father    Hypertension Father    Leukemia Father 55   Prostate cancer Father 85   Diabetes Father    Diabetes Brother    Other Brother        growth on finger   Learning disabilities Paternal Aunt    Lung cancer Maternal Grandmother    Heart attack Maternal Grandfather    Congestive Heart Failure Paternal Grandmother    Colon cancer Neg Hx    Rectal cancer Neg Hx    Stomach cancer Neg Hx     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  Tobacco Use   Smoking status: Some Days    Types: E-cigarettes   Smokeless tobacco: Never  Vaping Use   Vaping status: Every Day    Substances: CBD  Substance and Sexual Activity   Alcohol  use: Yes    Alcohol /week: 4.0 standard drinks of alcohol     Types: 4 Shots of liquor per week    Comment: (recovering alcoholic) occassioanl drink - drank one double last night, did not drink Sunday   Drug use: Yes    Frequency: 2.0 times per week    Types: Marijuana   Sexual activity: Yes    Birth control/protection: Condom  Other Topics Concern   Not on file  Social History Narrative   Work or School: [employer], 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, 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      Home Situation: lives alone      Spiritual Beliefs:  Lifestyle:       Hx alcohol  abuse   Social Drivers of Corporate Investment Banker Strain: Low Risk  (06/12/2022)   [facility], [facility]   Overall Financial Resource Strain (CARDIA)    Difficulty of Paying Living Expenses: Not very hard  Recent Concern: Financial Resource Strain - High Risk (04/06/2022)   Received from [facility]   Overall Financial Resource Strain (CARDIA)    Difficulty of Paying Living Expenses: Very hard  Food Insecurity: No Food Insecurity (07/13/2022)   Hunger Vital Sign    Worried About Running Out of Food in the Last Year: Never true    Ran Out of Food in the Last Year: Never true  Transportation Needs: No Transportation Needs (07/13/2022)   PRAPARE - Administrator, Civil Service (Medical): No    Lack of Transportation (Non-Medical): No  Physical Activity: Sufficiently Active (06/12/2022)   Received from [facility], [facility]   Exercise Vital Sign    Days of Exercise per Week: 7 days    Minutes of Exercise per Session: 30 min  Stress: No Stress Concern Present (06/12/2022)   Received from [facility], [facility] - Occupational Stress Questionnaire    Feeling of Stress : Not at all  Social Connections: Socially Isolated (06/12/2022)   Received from [facility], [facility]   Social Connection and Isolation Panel [NHANES]    Frequency of Communication with Friends and Family: More than three times a week    Frequency of Social Gatherings with Friends and Family: Twice a week    Attends Religious Services: Never    Database Administrator or Organizations: No    Attends Banker Meetings: Never    Marital Status: Never married  Intimate Partner Violence: Not At Risk (06/12/2022)   Received from [facility], [facility]   Humiliation, Afraid, Rape, and Kick questionnaire    Fear of Current or Ex-Partner: No    Emotionally Abused: No    Physically Abused: No    Sexually Abused: No    Review of Systems: All other review of systems negative except as mentioned in the HPI.  Physical Exam: Vital signs BP 113/78   Pulse 88   Temp 97.9 F (36.6 C) (Tympanic)   Resp 20   Ht 5' 9 (1.753 m)   Wt 87.8 kg   SpO2 100%   BMI 28.58 kg/m   General:   Alert,  Well-developed, pleasant and cooperative in NAD Lungs:  Clear throughout to auscultation.   Heart:  Regular rate and rhythm Abdomen:  Soft, nontender and nondistended.   Neuro/Psych:  Alert and cooperative. Normal mood and affect. A and O x 3  Marcey Naval, MD [facility]

## 2023-02-12 ENCOUNTER — Ambulatory Visit: Payer: BC Managed Care – PPO

## 2023-02-12 LAB — SURGICAL PATHOLOGY

## 2023-02-14 ENCOUNTER — Encounter (HOSPITAL_COMMUNITY): Payer: Self-pay | Admitting: Gastroenterology

## 2023-02-15 ENCOUNTER — Ambulatory Visit: Payer: BC Managed Care – PPO

## 2023-02-16 ENCOUNTER — Other Ambulatory Visit: Payer: Self-pay | Admitting: Family Medicine

## 2023-02-17 ENCOUNTER — Ambulatory Visit: Payer: BC Managed Care – PPO

## 2023-02-22 ENCOUNTER — Ambulatory Visit: Payer: BC Managed Care – PPO | Admitting: General Practice

## 2023-02-23 NOTE — Progress Notes (Signed)
Cardiology Clinic Note   Patient Name: Thomas Williamson Date of Encounter: 02/26/2023  Primary Care Provider:  Nelwyn Salisbury, MD Primary Cardiologist:  Thomas Ripple, DO  Patient Profile    Thomas Williamson 48 year old male presents the clinic today for follow-up evaluation of his hypertension and cardiomyopathy.  Past Medical History    Past Medical History:  Diagnosis Date   Acetabulum fracture, left (HCC) 06/30/2022   Alcohol use disorder, severe, in early remission (HCC) 03/30/2018   Allergy    Anxiety    Bipolar disorder, in partial remission, most recent episode hypomanic (HCC) 03/30/2018   sees [facility]   Bleeding internal hemorrhoids 09/12/2013   Cannabis dependence (HCC) 07/13/2018   Daily use   Cervical disc disease 11/20/2014   Chronic anxiety 07/13/2018   Chronic systolic (congestive) heart failure (HCC)    Complication of anesthesia    PONV   Depression    Dyslipidemia    GERD (gastroesophageal reflux disease)    Hernia, inguinal, bilateral 02/24/2011   High ankle sprain of right lower extremity 01/16/2015   History of hiatal hernia    Hx of hepatitis C 10/05/2017   -treated in 2019 -hepatology recommended no further surveillance needed except for LFTs with labs and see hepatologist if elevated   Hypertension    Lipids abnormal 09/29/2013   Pneumonia    PONV (postoperative nausea and vomiting)    Vitamin D deficiency 07/01/2022   Past Surgical History:  Procedure Laterality Date   ANTERIOR CRUCIATE LIGAMENT REPAIR  2010   BIOPSY  03/13/2019   Procedure: BIOPSY;  Surgeon: Thomas Deeds, MD;  Location: [facility];  Service: Gastroenterology;;   COLONOSCOPY N/A 03/13/2019   per Dr. Adela Williamson, adenomatous polyps, repeat in 3 yrs   COLONOSCOPY WITH PROPOFOL N/A 02/11/2023   Procedure: COLONOSCOPY WITH PROPOFOL;  Surgeon: Thomas Deeds, MD;  Location: [facility];  Service: Gastroenterology;  Laterality:  N/A;   INGUINAL HERNIA REPAIR  02/23/2012   Procedure: LAPAROSCOPIC BILATERAL INGUINAL HERNIA REPAIR;  Surgeon: Thomas Cocking, MD;  Location: [facility];  Service: General;  Laterality: Bilateral;  Laparoscopic Bilateral Inguinal Hernia Repair with mesh   INSERTION OF MESH  02/23/2012   Procedure: INSERTION OF MESH;  Surgeon: Thomas Cocking, MD;  Location: [facility];  Service: General;  Laterality: N/A;   NOSE SURGERY  1610,9604   ORIF ACETABULAR FRACTURE Left 06/30/2022   Procedure: OPEN REDUCTION INTERNAL FIXATION (ORIF) ACETABULAR FRACTURE;  Surgeon: Thomas Galas, MD;  Location: [facility];  Service: Orthopedics;  Laterality: Left;   POLYPECTOMY  03/13/2019   Procedure: POLYPECTOMY;  Surgeon: Thomas Deeds, MD;  Location: [facility];  Service: Gastroenterology;;   POLYPECTOMY  02/11/2023   Procedure: POLYPECTOMY;  Surgeon: Thomas Deeds, MD;  Location: [facility];  Service: Gastroenterology;;    Allergies  No Known Allergies  History of Present Illness    Thomas Williamson has a PMH of hypertension, alcoholic cardiomyopathy, LBBB, cervical disc disease, dyslipidemia, chronic anxiety, vitamin D deficiency, bipolar 1 disorder, and left foot drop.  Echocardiogram at an outside institution showed an EF of 30-35%.  Echocardiogram 09/28/2022 showed an LVEF of 25-30%, moderately dilated left ventricle, mild LVH, G1 DD, mildly dilated left atria, mildly dilated right atria, trivial mitral valve regurgitation and no other significant valvular abnormalities.  He was seen in follow-up by Dr. Servando Williamson on 09/22/2022.  During that time he reported that he had forgotten to get his follow-up  echocardiogram.  He reported that he had started drinking alcohol in some days.  He denied binge drinking and reported that he was planning to [facility] to rehab.  Follow-up echocardiogram (details above).  He was continued on losartan and metoprolol.  His cardiac event monitor showed evidence of atrial fibrillation.  He  presents to the clinic today for follow-up evaluation and states he is feeling much better.  He completed rehab in [place].  He had repeat stress testing and echocardiogram while he was there.  His follow-up echocardiogram showed improved EF to 45% from 25-30%.  He brings in test results.  We reviewed these.  He has been seeing his PCP and psychiatrist.  He reports that his psychiatrist is concerned about his amitriptyline.  EKG today shows improvement in rate and QTc.  Case reviewed with Dr. Servando Williamson.  He may continue amitriptyline.  I will provide letter stating this.  I am limited in GDMT at this time due to patient's blood pressure.  Will continue his current medication and plan follow-up in 4 to 6 months.  I have recommended that he increase his physical activity and continue to work on dietary modification..  Today he denies chest pain, shortness of breath, lower extremity edema, fatigue, palpitations, melena, hematuria, hemoptysis, diaphoresis, weakness, presyncope, syncope, orthopnea, and PND.   Home Medications    Prior to Admission medications   Medication Sig Start Date End Date Taking? Authorizing Provider  amitriptyline (ELAVIL) 25 MG tablet Take 50 mg by mouth at bedtime. 01/11/23   [provider]  amLODipine (NORVASC) 10 MG tablet TAKE 1 TABLET BY MOUTH EVERY DAY 01/25/23   Thomas Salisbury, MD  ARIPiprazole (ABILIFY) 10 MG tablet Take 10 mg by mouth daily. 11/04/22   [provider]  benzonatate (TESSALON) 200 MG capsule Take 1 capsule (200 mg total) by mouth every 6 (six) hours as needed. 02/05/23   Thomas Salisbury, MD  gabapentin (NEURONTIN) 600 MG tablet Take 900 mg by mouth 3 (three) times daily. 12/16/22   [provider]  HYDROcodone bit-homatropine (HYCODAN) 5-1.5 MG/5ML syrup Take 5 mLs by mouth every 4 (four) hours as needed. Patient not taking: Reported on 02/05/2023 02/01/23   Thomas Salisbury, MD  losartan-hydrochlorothiazide [facility] 100-25 MG tablet TAKE  1 TABLET BY MOUTH EVERY DAY 10/07/22   Thomas Salisbury, MD  metoprolol succinate (TOPROL-XL) 25 MG 24 hr tablet TAKE 1 TABLET (25 MG TOTAL) BY MOUTH DAILY. 01/04/23 07/03/23  Thomas Salisbury, MD  Omeprazole 20 MG TBEC Take 20 mg by mouth daily.     [provider]  pravastatin (PRAVACHOL) 40 MG tablet TAKE 1 TABLET BY MOUTH EVERY DAY 10/07/22   Thomas Salisbury, MD    Family History    Family History  Problem Relation Age of Onset   Hypertension Mother    Atrial fibrillation Mother    Cancer Father    Hypertension Father    Leukemia Father 80   Prostate cancer Father 66   Diabetes Father    Diabetes Brother    Other Brother        growth on finger   Learning disabilities Paternal Aunt    Lung cancer Maternal Grandmother    Heart attack Maternal Grandfather    Congestive Heart Failure Paternal Grandmother    Colon cancer Neg Hx    Rectal cancer Neg Hx    Stomach cancer Neg Hx    He indicated that his mother is alive. He indicated that  his father is alive. He indicated that his brother is alive. He indicated that his maternal grandmother is deceased. He indicated that his maternal grandfather is deceased. He indicated that his paternal grandmother is deceased. He indicated that his paternal grandfather is deceased. He indicated that his maternal aunt is alive. He indicated that his maternal uncle is deceased. He indicated that his paternal aunt is deceased. He indicated that the status of his neg hx is unknown.  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  Tobacco Use   Smoking status: Some Days    Types: E-cigarettes   Smokeless tobacco: Never  Vaping Use   Vaping status: Every Day   Substances: CBD   Substance and Sexual Activity   Alcohol use: Yes    Alcohol/week: 4.0 standard drinks of alcohol    Types: 4 Shots of liquor per week    Comment: (recovering alcoholic) occassioanl drink - drank one double last night, did not drink Sunday   Drug use: Yes    Frequency: 2.0 times per week    Types: Marijuana   Sexual activity: Yes    Birth control/protection: Condom  Other Topics Concern   Not on file  Social History Narrative   Work or School: [employer] at [employer], 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, 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      Home Situation: lives alone      Spiritual Beliefs:      Lifestyle:       Hx alcohol abuse   Social Drivers of Corporate investment banker Strain: Low Risk  (06/12/2022)   Received [facility], [facility]   Overall Financial Resource Strain (CARDIA)    Difficulty of Paying Living Expenses: Not very hard  Recent Concern: Financial Resource Strain - High Risk (04/06/2022)   [facility] from [facility]   Overall Financial Resource Strain (CARDIA)    Difficulty of Paying Living Expenses: Very hard  Food Insecurity: No Food Insecurity (07/13/2022)   Hunger Vital Sign    Worried About Running Out of Food in the Last Year: Never true    Ran Out of Food in the Last Year: Never true  Transportation Needs: No Transportation Needs (07/13/2022)   PRAPARE - Administrator, Civil Service (Medical): No    Lack of Transportation (Non-Medical): No  Physical Activity: Sufficiently Active (06/12/2022)   [facility], [facility]   Exercise Vital Sign    Days of Exercise per Week: 7 days    Minutes of Exercise per Session: 30 min  Stress: No Stress Concern Present (06/12/2022)   Received from [facility], [facility] Occupational [facility] - Occupational Stress Questionnaire    Feeling of Stress : Not at all  Social Connections: Socially Isolated (06/12/2022)   Received from [facility], [facility]   Social Connection and Isolation Panel [NHANES]    Frequency of Communication with Friends and Family: More than three times a week    Frequency of Social Gatherings with Friends and Family: Twice a week    Attends Religious  Services: Never    Active Member of Clubs or Organizations: No    Attends Banker Meetings: Never    Marital Status: Never married  Intimate Partner Violence: Not At Risk (06/12/2022)   Received from [facility], [facility]   Humiliation, Afraid, Rape, and Kick questionnaire    Fear of Current or Ex-Partner: No    Emotionally Abused: No    Physically Abused: No    Sexually Abused: No     Review of Systems    General:  No chills, fever, night sweats or weight changes.  Cardiovascular:  No chest pain, dyspnea on exertion, edema, orthopnea, palpitations, paroxysmal nocturnal dyspnea. Dermatological: No rash, lesions/masses Respiratory: No cough, dyspnea Urologic: No hematuria, dysuria Abdominal:   No nausea, vomiting, diarrhea, bright red blood per rectum, melena, or hematemesis Neurologic:  No visual changes, wkns, changes in mental status. All other systems reviewed and are otherwise negative except as noted above.  Physical Exam    VS:  BP 100/70   Pulse (!) 101   Ht 5\' 10"  (1.778 m)   Wt 199 lb (90.3 kg)   SpO2 99%   BMI 28.55 kg/m  , BMI Body mass index is 28.55 kg/m. GEN: Well nourished, well developed, in no acute distress. HEENT: normal. Neck: Supple, no JVD, carotid bruits, or masses. Cardiac: RRR, no murmurs, rubs, or gallops. No clubbing, cyanosis, edema.  Radials/DP/PT 2+ and equal bilaterally.  Respiratory:  Respirations regular and unlabored, clear to auscultation bilaterally. GI: Soft, nontender, nondistended, BS + x 4. MS: no deformity or atrophy. Skin: warm and dry, no rash. Neuro:  Strength and sensation are intact. Psych: Normal affect.  Accessory Clinical Findings    Recent Labs: 07/03/2022: TSH  0.375 08/27/2022: Platelets 485 09/22/2022: Magnesium 2.0 10/21/2022: ALT 11 02/11/2023: BUN 14; Creatinine, Ser 1.00; Hemoglobin 12.6; Potassium 4.2; Sodium 140   Recent Lipid Panel    Component Value Date/Time   CHOL 279 (H) 03/10/2021 1625   TRIG (H) 03/10/2021 1625    403.0 Triglyceride is over 400; calculations on Lipids are invalid.   HDL 49.80 03/10/2021 1625   CHOLHDL 6 03/10/2021 1625   VLDL 63.2 (H) 09/19/2018 0947   LDLCALC 139 (H) 12/11/2019 1057   LDLDIRECT 175.0 03/10/2021 1625         ECG personally reviewed by me today- EKG Interpretation Date/Time:  Friday February 26 2023 11:41:20 EST Ventricular Rate:  91 PR Interval:  170 QRS Duration:  144 QT Interval:  396 QTC Calculation: 487 R Axis:   22  Text Interpretation: Normal sinus rhythm Left bundle branch block When compared with ECG of 27-Aug-2022 15:05, T wave inversion now evident in Inferior leads Confirmed by Edd Fabian [phone]) on 02/26/2023 12:03:16 PM      Echocardiogram 09/28/2022  IMPRESSIONS     1. Left ventricular ejection fraction, by estimation, is 25 to 30%. The  left ventricle has severely decreased function. The left ventricle  demonstrates regional wall motion abnormalities (see scoring  diagram/findings for description). The left  ventricular internal cavity size was moderately dilated. There is mild  left ventricular hypertrophy. Left ventricular diastolic parameters are  consistent with Grade I diastolic dysfunction (impaired relaxation). The  average left ventricular global  longitudinal strain is -9.9 %. The global longitudinal strain is normal.   2. Right ventricular systolic function is normal. The right ventricular  size is normal.   3. Left atrial size was mildly dilated.   4. Right atrial size  was mildly dilated.   5. The mitral valve is grossly normal. Trivial mitral valve  regurgitation. No evidence of mitral stenosis.   6. The aortic valve is tricuspid. Aortic valve  regurgitation is not  visualized. No aortic stenosis is present.   7. The inferior vena cava is normal in size with greater than 50%  respiratory variability, suggesting right atrial pressure of 3 mmHg.   Comparison(s): Prior images reviewed side by side.   FINDINGS   Left Ventricle: Left ventricular ejection fraction, by estimation, is 25  to 30%. The left ventricle has severely decreased function. The left  ventricle demonstrates regional wall motion abnormalities. The average  left ventricular global longitudinal  strain is -9.9 %. The global longitudinal strain is normal. 3D ejection  fraction reviewed and evaluated as part of the interpretation. Alternate  measurement of EF is felt to be most reflective of LV function. The left  ventricular internal cavity size was   moderately dilated. There is mild left ventricular hypertrophy. Abnormal  (paradoxical) septal motion, consistent with left bundle branch block.  Left ventricular diastolic parameters are consistent with Grade I  diastolic dysfunction (impaired  relaxation).     LV Wall Scoring:  The apical septal segment, apical anterior segment, and apex are akinetic.   Right Ventricle: The right ventricular size is normal. No increase in  right ventricular wall thickness. Right ventricular systolic function is  normal.   Left Atrium: Left atrial size was mildly dilated.   Right Atrium: Right atrial size was mildly dilated.   Pericardium: There is no evidence of pericardial effusion.   Mitral Valve: The mitral valve is grossly normal. Trivial mitral valve  regurgitation. No evidence of mitral valve stenosis.   Tricuspid Valve: The tricuspid valve is normal in structure. Tricuspid  valve regurgitation is trivial. No evidence of tricuspid stenosis.   Aortic Valve: The aortic valve is tricuspid. Aortic valve regurgitation is  not visualized. No aortic stenosis is present.   Pulmonic Valve: The pulmonic valve was normal in  structure. Pulmonic valve  regurgitation is not visualized. No evidence of pulmonic stenosis.   Aorta: The aortic root and ascending aorta are structurally normal, with  no evidence of dilitation.   Venous: The inferior vena cava is normal in size with greater than 50%  respiratory variability, suggesting right atrial pressure of 3 mmHg.   IAS/Shunts: No atrial level shunt detected by color flow Doppler.      Assessment & Plan   1.  Nonischemic cardiomyopathy-EF noted to be 25-30% on echocardiogram 8/24.  This was felt to be related to alcoholic cardiomyopathy.  Repeat stress testing and echocardiogram showed improved ejection fraction to 45% on repeat echocardiogram.  Also completed SPECT stress testing which showed large anterior apical infarct, no evidence of ischemia, and EF of 41%.  Limited in up titration of GDMT due to blood pressure. Continue losartan, metoprolol Heart healthy low-sodium diet Increase physical activity as tolerated-recommended starting out with 15 minutes of light aerobic activity every other day and building from that point.   Essential hypertension-BP today 100/70. Maintain blood pressure log Increase physical activity as tolerated Continue losartan, metoprolol  Left bundle branch block-EKG today shows normal sinus rhythm left bundle branch block 91 bpm QTc 487.  Denies episodes of lightheadedness, presyncope or syncope.  He may continue his amitriptyline. Letter provided about continuing amitriptyline Continue metoprolol Continue to monitor  Disposition: Follow-up with Dr. Servando Williamson or me in 4-6 months.   Thomas Williamson. Seda Kronberg NP-C  02/26/2023, 12:02 PM [facility] [address] Office [phone] Fax [phone]    I spent 15 minutes examining this patient, reviewing medications, and using patient centered shared decision making involving their cardiac care.   I spent greater than 20 minutes reviewing their  past medical history,  medications, and prior cardiac tests.

## 2023-02-24 DIAGNOSIS — F331 Major depressive disorder, recurrent, moderate: Secondary | ICD-10-CM | POA: Diagnosis not present

## 2023-02-26 ENCOUNTER — Encounter: Payer: Self-pay | Admitting: General Practice

## 2023-02-26 ENCOUNTER — Ambulatory Visit: Payer: BC Managed Care – PPO | Attending: General Practice | Admitting: General Practice

## 2023-02-26 VITALS — BP 100/70 | HR 101 | Ht 70.0 in | Wt 199.0 lb

## 2023-02-26 DIAGNOSIS — I447 Left bundle-branch block, unspecified: Secondary | ICD-10-CM | POA: Diagnosis not present

## 2023-02-26 DIAGNOSIS — I429 Cardiomyopathy, unspecified: Secondary | ICD-10-CM | POA: Diagnosis not present

## 2023-02-26 DIAGNOSIS — I10 Essential (primary) hypertension: Secondary | ICD-10-CM | POA: Diagnosis not present

## 2023-02-26 NOTE — Patient Instructions (Signed)
Medication Instructions:  The current medical regimen is effective;  continue present plan and medications as directed. Please refer to the Current Medication list given to you today.  *If you need a refill on your cardiac medications before your next appointment, please call your pharmacy*  Lab Work: NONE  Other Instructions EXERCISE 15 MINUTES EVERY-OTHER-DAY INCREASE AS TOLERATED PLEASE READ AND FOLLOW ATTACHED LOW SODIUM AND INCREASED FIBER DIET  Follow-Up: At [facility], you and your health needs are our priority.  As part of our continuing mission to provide you with exceptional heart care, we have created designated Provider Care Teams.  These Care Teams include your primary Cardiologist (physician) and Advanced Practice Providers (APPs -  Physician Assistants and Nurse Practitioners) who all work together to provide you with the care you need, when you need it.  Your next appointment:   4-6 month(s)  Provider:   Thomasene Ripple, DO     High-Fiber Eating Plan Fiber, also called dietary fiber, is found in foods such as fruits, vegetables, whole grains, and beans. A high-fiber diet can be good for your health. Your health care provider may recommend a high-fiber diet to help: Prevent trouble pooping (constipation). Lower your cholesterol. Treat the following conditions: Hemorrhoids. This is inflammation of veins in the anus. Inflammation of specific areas of the digestive tract. Irritable bowel syndrome (IBS). This is a problem of the large intestine, also called the colon, that sometimes causes belly pain and bloating. Prevent overeating as part of a weight-loss plan. Lower the risk of heart disease, type 2 diabetes, and certain cancers. What are tips for following this plan? Reading food labels  Check the nutrition facts label on foods for the amount of dietary fiber. Choose foods that have 4 grams of fiber or more per serving. The recommended goals for how much fiber  you should eat each day include: Males 17 years old or younger: 30-34 g. Males over 27 years old: 28-34 g. Females 73 years old or younger: 25-28 g. Females over 20 years old: 22-25 g. Your daily fiber goal is _____________ g. Shopping Choose whole fruits and vegetables instead of processed. For example, choose apples instead of apple juice or applesauce. Choose a variety of high-fiber foods such as avocados, lentils, oats, and pinto beans. Read the nutrition facts label on foods. Check for foods with added fiber. These foods often have high sugar and salt (sodium) amounts per serving. Cooking Use whole-grain flour for baking and cooking. Cook with brown rice instead of white rice. Make meals that have a lot of beans and vegetables in them, such as chili or vegetable-based soups. Meal planning Start the day with a breakfast that is high in fiber, such as a cereal that has 5 g of fiber or more per serving. Eat breads and cereals that are made with whole-grain flour instead of refined flour or white flour. Eat brown rice, bulgur wheat, or millet instead of white rice. Use beans in place of meat in soups, salads, and pasta dishes. Be sure that half of the grains you eat each day are whole grains. General information You can get the recommended amount of dietary fiber by: Eating a variety of fruits, vegetables, grains, nuts, and beans. Taking a fiber supplement if you aren't able to eat enough fiber. It's better to get fiber through food than from a supplement. Slowly increase how much fiber you eat. If you increase the amount of fiber you eat too quickly, you may have bloating,  cramping, or gas. Drink plenty of water to help you digest fiber. Choose high-fiber snacks, such as berries, raw vegetables, nuts, and popcorn. What foods should I eat? Fruits Berries. Pears. Apples. Oranges. Avocado. Prunes and raisins. Dried figs. Vegetables Sweet potatoes. Spinach. Kale. Artichokes. Cabbage.  Broccoli. Cauliflower. Green peas. Carrots. Squash. Grains Whole-grain breads. Multigrain cereal. Oats and oatmeal. Brown rice. Barley. Bulgur wheat. Millet. Quinoa. Bran muffins. Popcorn. Rye wafer crackers. Meats and other proteins Navy beans, kidney beans, and pinto beans. Soybeans. Split peas. Lentils. Nuts and seeds. Dairy Fiber-fortified yogurt. Fortified means that fiber has been added to the product. Beverages Fiber-fortified soy milk. Fiber-fortified orange juice. Other foods Fiber bars. The items listed above may not be all the foods and drinks you can have. Talk to a dietitian to learn more. What foods should I avoid? Fruits Fruit juice. Cooked, strained fruit. Vegetables Fried potatoes. Canned vegetables. Well-cooked vegetables. Grains White bread. Pasta made with refined flour. White rice. Meats and other proteins Fatty meat. Fried chicken or fried fish. Dairy Milk. Cream cheese. Sour cream. Fats and oils Butters. Beverages Soft drinks. Other foods Cakes and pastries. The items listed above may not be all the foods and drinks you should avoid. Talk to a dietitian to learn more. This information is not intended to replace advice given to you by your health care provider. Make sure you discuss any questions you have with your health care provider. Document Revised: 04/13/2022 Document Reviewed: 04/13/2022 Elsevier Patient Education  2024 [employer]  DASH Eating Plan-LOW SODIUM DIET DASH stands for Dietary Approaches to Stop Hypertension. The DASH eating plan is a healthy eating plan that has been shown to: Lower high blood pressure (hypertension). Reduce your risk for type 2 diabetes, heart disease, and stroke. Help with weight loss. What are tips for following this plan? Reading food labels Check food labels for the amount of salt (sodium) per serving. Choose foods with less than 5 percent of the Daily Value (DV) of sodium. In general, foods with less than 300  milligrams (mg) of sodium per serving fit into this eating plan. To find whole grains, look for the word "whole" as the first word in the ingredient list. Shopping Buy products labeled as "low-sodium" or "no salt added." Buy fresh foods. Avoid canned foods and pre-made or frozen meals. Cooking Try not to add salt when you cook. Use salt-free seasonings or herbs instead of table salt or sea salt. Check with your health care provider or pharmacist before using salt substitutes. Do not fry foods. Cook foods in healthy ways, such as baking, boiling, grilling, roasting, or broiling. Cook using oils that are good for your heart. These include olive, canola, avocado, soybean, and sunflower oil. Meal planning  Eat a balanced diet. This should include: 4 or more servings of fruits and 4 or more servings of vegetables each day. Try to fill half of your plate with fruits and vegetables. 6-8 servings of whole grains each day. 6 or less servings of lean meat, poultry, or fish each day. 1 oz is 1 serving. A 3 oz (85 g) serving of meat is about the same size as the palm of your hand. One egg is 1 oz (28 g). 2-3 servings of low-fat dairy each day. One serving is 1 cup (237 mL). 1 serving of nuts, seeds, or beans 5 times each week. 2-3 servings of heart-healthy fats. Healthy fats called omega-3 fatty acids are found in foods such as walnuts, flaxseeds, fortified milks, and  eggs. These fats are also found in cold-water fish, such as sardines, salmon, and mackerel. Limit how much you eat of: Canned or prepackaged foods. Food that is high in trans fat, such as fried foods. Food that is high in saturated fat, such as fatty meat. Desserts and other sweets, sugary drinks, and other foods with added sugar. Full-fat dairy products. Do not salt foods before eating. Do not eat more than 4 egg yolks a week. Try to eat at least 2 vegetarian meals a week. Eat more home-cooked food and less restaurant, buffet, and fast  food. Lifestyle When eating at a restaurant, ask if your food can be made with less salt or no salt. If you drink alcohol: Limit how much you have to: 0-1 drink a day if you are male. 0-2 drinks a day if you are male. Know how much alcohol is in your drink. In the [place] one drink is one 12 oz bottle of beer (355 mL), one 5 oz glass of wine (148 mL), or one 1 oz glass of hard liquor (44 mL). General information Avoid eating more than 2,300 mg of salt a day. If you have hypertension, you may need to reduce your sodium intake to 1,500 mg a day. Work with your provider to stay at a healthy body weight or lose weight. Ask what the best weight range is for you. On most days of the week, get at least 30 minutes of exercise that causes your heart to beat faster. This may include walking, swimming, or biking. Work with your provider or dietitian to adjust your eating plan to meet your specific calorie needs. What foods should I eat? Fruits All fresh, dried, or frozen fruit. Canned fruits that are in their natural juice and do not have sugar added to them. Vegetables Fresh or frozen vegetables that are raw, steamed, roasted, or grilled. Low-sodium or reduced-sodium tomato and vegetable juice. Low-sodium or reduced-sodium tomato sauce and tomato paste. Low-sodium or reduced-sodium canned vegetables. Grains Whole-grain or whole-wheat bread. Whole-grain or whole-wheat pasta. Brown rice. Orpah Cobb. Bulgur. Whole-grain and low-sodium cereals. Pita bread. Low-fat, low-sodium crackers. Whole-wheat flour tortillas. Meats and other proteins Skinless chicken or [place]. Ground chicken or [place]. Pork with fat trimmed off. Fish and seafood. Egg whites. Dried beans, peas, or lentils. Unsalted nuts, nut butters, and seeds. Unsalted canned beans. Lean cuts of beef with fat trimmed off. Low-sodium, lean precooked or cured meat, such as sausages or meat loaves. Dairy Low-fat (1%) or fat-free (skim) milk.  Reduced-fat, low-fat, or fat-free cheeses. Nonfat, low-sodium ricotta or cottage cheese. Low-fat or nonfat yogurt. Low-fat, low-sodium cheese. Fats and oils Soft margarine without trans fats. Vegetable oil. Reduced-fat, low-fat, or light mayonnaise and salad dressings (reduced-sodium). Canola, safflower, olive, avocado, soybean, and sunflower oils. Avocado. Seasonings and condiments Herbs. Spices. Seasoning mixes without salt. Other foods Unsalted popcorn and pretzels. Fat-free sweets. The items listed above may not be all the foods and drinks you can have. Talk to a dietitian to learn more. What foods should I avoid? Fruits Canned fruit in a light or heavy syrup. Fried fruit. Fruit in cream or butter sauce. Vegetables Creamed or fried vegetables. Vegetables in a cheese sauce. Regular canned vegetables that are not marked as low-sodium or reduced-sodium. Regular canned tomato sauce and paste that are not marked as low-sodium or reduced-sodium. Regular tomato and vegetable juices that are not marked as low-sodium or reduced-sodium. Rosita Fire. Olives. Grains Baked goods made with fat, such as croissants, muffins, or some  breads. Dry pasta or rice meal packs. Meats and other proteins Fatty cuts of meat. Ribs. Fried meat. Tomasa Blase. Bologna, salami, and other precooked or cured meats, such as sausages or meat loaves, that are not lean and low in sodium. Fat from the back of a pig (fatback). Bratwurst. Salted nuts and seeds. Canned beans with added salt. Canned or smoked fish. Whole eggs or egg yolks. Chicken or [place] with skin. Dairy Whole or 2% milk, cream, and half-and-half. Whole or full-fat cream cheese. Whole-fat or sweetened yogurt. Full-fat cheese. Nondairy creamers. Whipped toppings. Processed cheese and cheese spreads. Fats and oils Butter. Stick margarine. Lard. Shortening. Ghee. Bacon fat. Tropical oils, such as coconut, palm kernel, or palm oil. Seasonings and condiments Onion salt, garlic  salt, seasoned salt, table salt, and sea salt. Worcestershire sauce. Tartar sauce. Barbecue sauce. Teriyaki sauce. Soy sauce, including reduced-sodium soy sauce. Steak sauce. Canned and packaged gravies. Fish sauce. Oyster sauce. Cocktail sauce. Store-bought horseradish. Ketchup. Mustard. Meat flavorings and tenderizers. Bouillon cubes. Hot sauces. Pre-made or packaged marinades. Pre-made or packaged taco seasonings. Relishes. Regular salad dressings. Other foods Salted popcorn and pretzels. The items listed above may not be all the foods and drinks you should avoid. Talk to a dietitian to learn more. Where to find more information National Heart, Lung, and Blood Institute (NHLBI): BuffaloDryCleaner.gl American Heart Association (AHA): heart.org Academy of Nutrition and Dietetics: eatright.org National Kidney Foundation (NKF): kidney.org This information is not intended to replace advice given to you by your health care provider. Make sure you discuss any questions you have with your health care provider. Document Revised: 02/05/2022 Document Reviewed: 02/05/2022 Elsevier Patient Education  2024 [employer].

## 2023-03-08 DIAGNOSIS — M21372 Foot drop, left foot: Secondary | ICD-10-CM | POA: Diagnosis not present

## 2023-03-10 ENCOUNTER — Ambulatory Visit: Payer: BC Managed Care – PPO | Attending: Orthopedic Surgery

## 2023-03-10 DIAGNOSIS — F1029 Alcohol dependence with unspecified alcohol-induced disorder: Secondary | ICD-10-CM | POA: Diagnosis not present

## 2023-03-10 DIAGNOSIS — R262 Difficulty in walking, not elsewhere classified: Secondary | ICD-10-CM | POA: Insufficient documentation

## 2023-03-10 DIAGNOSIS — G629 Polyneuropathy, unspecified: Secondary | ICD-10-CM | POA: Diagnosis not present

## 2023-03-10 DIAGNOSIS — F411 Generalized anxiety disorder: Secondary | ICD-10-CM | POA: Diagnosis not present

## 2023-03-10 DIAGNOSIS — F332 Major depressive disorder, recurrent severe without psychotic features: Secondary | ICD-10-CM | POA: Diagnosis not present

## 2023-03-10 DIAGNOSIS — F331 Major depressive disorder, recurrent, moderate: Secondary | ICD-10-CM | POA: Diagnosis not present

## 2023-03-10 DIAGNOSIS — M6281 Muscle weakness (generalized): Secondary | ICD-10-CM | POA: Insufficient documentation

## 2023-03-10 DIAGNOSIS — M25552 Pain in left hip: Secondary | ICD-10-CM | POA: Insufficient documentation

## 2023-03-11 DIAGNOSIS — K1370 Unspecified lesions of oral mucosa: Secondary | ICD-10-CM | POA: Diagnosis not present

## 2023-03-16 ENCOUNTER — Ambulatory Visit: Payer: BC Managed Care – PPO

## 2023-03-16 DIAGNOSIS — M6281 Muscle weakness (generalized): Secondary | ICD-10-CM

## 2023-03-16 DIAGNOSIS — R262 Difficulty in walking, not elsewhere classified: Secondary | ICD-10-CM

## 2023-03-16 DIAGNOSIS — M25552 Pain in left hip: Secondary | ICD-10-CM | POA: Diagnosis not present

## 2023-03-16 NOTE — Therapy (Signed)
OUTPATIENT PHYSICAL THERAPY LOWER EXTREMITY RECERT   Patient Name: Thomas Williamson MRN: [id number] DOB:29-Oct-1975, 48 y.o., male Today's Date: 03/16/2023  END OF SESSION:  PT End of Session - 03/16/23 1120     Visit Number 2    Number of Visits 13    Date for PT Re-Evaluation 05/11/23    PT Start Time 1118    PT Stop Time 1200    PT Time Calculation (min) 42 min    Activity Tolerance Patient tolerated treatment well    Behavior During Therapy [facility] for tasks assessed/performed             Past Medical History:  Diagnosis Date   Acetabulum fracture, left (HCC) 06/30/2022   Alcohol use disorder, severe, in early remission (HCC) 03/30/2018   Allergy    Anxiety    Bipolar disorder, in partial remission, most recent episode hypomanic (HCC) 03/30/2018   [facility] Dr[facility]   Bleeding internal hemorrhoids 09/12/2013   Cannabis dependence (HCC) 07/13/2018   Daily use   Cervical disc disease 11/20/2014   Chronic anxiety 07/13/2018   Chronic systolic (congestive) heart failure (HCC)    Complication of anesthesia    PONV   Depression    Dyslipidemia    GERD (gastroesophageal reflux disease)    Hernia, inguinal, bilateral 02/24/2011   High ankle sprain of right lower extremity 01/16/2015   History of hiatal hernia    Hx of hepatitis C 10/05/2017   -treated in 2019 -hepatology recommended no further surveillance needed except for LFTs with labs and see hepatologist if elevated   Hypertension    Lipids abnormal 09/29/2013   Pneumonia    PONV (postoperative nausea and vomiting)    Vitamin D deficiency 07/01/2022   Past Surgical History:  Procedure Laterality Date   ANTERIOR CRUCIATE LIGAMENT REPAIR  2010   BIOPSY  03/13/2019   Procedure: BIOPSY;  Surgeon: Benancio Deeds, MD;  Location: [facility];  Service: Gastroenterology;;   COLONOSCOPY N/A 03/13/2019   per Dr. Adela Lank, adenomatous polyps, repeat in 3 yrs   COLONOSCOPY WITH PROPOFOL  N/A 02/11/2023   Procedure: COLONOSCOPY WITH PROPOFOL;  Surgeon: Benancio Deeds, MD;  Location: [facility];  Service: Gastroenterology;  Laterality: N/A;   INGUINAL HERNIA REPAIR  02/23/2012   Procedure: LAPAROSCOPIC BILATERAL INGUINAL HERNIA REPAIR;  Surgeon: Kandis Cocking, MD;  Location: [facility];  Service: General;  Laterality: Bilateral;  Laparoscopic Bilateral Inguinal Hernia Repair with mesh   INSERTION OF MESH  02/23/2012   Procedure: INSERTION OF MESH;  Surgeon: Kandis Cocking, MD;  Location: [facility];  Service: General;  Laterality: N/A;   NOSE SURGERY  [id number]   ORIF ACETABULAR FRACTURE Left 06/30/2022   Procedure: OPEN REDUCTION INTERNAL FIXATION (ORIF) ACETABULAR FRACTURE;  Surgeon: Myrene Galas, MD;  Location: [facility];  Service: Orthopedics;  Laterality: Left;   POLYPECTOMY  03/13/2019   Procedure: POLYPECTOMY;  Surgeon: Benancio Deeds, MD;  Location: [facility];  Service: Gastroenterology;;   POLYPECTOMY  02/11/2023   Procedure: POLYPECTOMY;  Surgeon: Benancio Deeds, MD;  Location: [facility];  Service: Gastroenterology;;   Patient Active Problem List   Diagnosis Date Noted   Abnormal LFTs 07/08/2022   Left foot drop 07/01/2022   Vitamin D deficiency 07/01/2022   Acetabulum fracture, left (HCC) 06/30/2022   Alcohol dependence (HCC) 01/05/2022   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   Alcohol-induced acute pancreatitis without infection or necrosis 12/05/2021   Genetic testing 04/21/2019  Benign neoplasm of colon    Loose stools    History of colonic polyps 03/07/2019   Cardiac left ventricular ejection fraction 30-35 percent 03/07/2019   Alcoholic ketoacidosis 11/18/2018   Dyslipidemia 09/16/2018   Fever blister 09/16/2018   Cardiomyopathy, alcoholic (HCC) 09/16/2018   Left bundle branch block (LBBB) 09/16/2018   Cannabis dependence (HCC) 07/13/2018   Chronic anxiety 07/13/2018   Bipolar 1 disorder, depressed, full remission (HCC)  03/30/2018   Cervical disc disease 11/20/2014   Hypertension 02/24/2011    PCP: Nelwyn Salisbury, MD  REFERRING PROVIDER: Montez Morita PA-C  REFERRING DIAG:  Z61.096E (ICD-10-CM) - Closed fracture of acetabulum with dislocation of hip with delayed healing, left  M21.372 (ICD-10-CM) - Left foot drop    THERAPY DIAG:  Pain in left hip  Muscle weakness (generalized)  Difficulty in walking, not elsewhere classified  Rationale for Evaluation and Treatment: Rehabilitation  ONSET DATE: 06/30/22  SUBJECTIVE:   SUBJECTIVE STATEMENT: Pt has been absent due to work related tasks and scheduling issues. Has been compliant wit HEP. Got a new LLE AFO. No falls.   PERTINENT HISTORY: Pt returning from "mental health" program in [place]. Reports still dealing with L foot drop. Planning to see foot specialist. Denies L hip pain but still having dorsal pain on his foot like before. Reports walking a lot in [place] but not a lot of strength training. Reports still dealing with his dynamic balance relying on stepping strategy to correct balance, still feels his LLE strength is lesser than RLE. Reports having 2 falls while away. Tripped his L toe carrying his laundry basket one time and then tripping going up the stairs. Does state he was not wearing his ankle brace those two times. Also having trouble with balance when applied to unstable surfaces especially combined with cognitive dual tasking.   PAIN:  Are you having pain? No  PRECAUTIONS: None  RED FLAGS: None   WEIGHT BEARING RESTRICTIONS: No  FALLS:  Has patient fallen in last 6 months? Yes. Number of falls 2  LIVING ENVIRONMENT: Lives with: lives with their family Lives in: House/apartment Stairs: No Has following equipment at home: Dan Humphreys - 2 wheeled  OCCUPATION: Press photographer for disability  PLOF: Independent  PATIENT GOALS: Improve balance and strength   NEXT MD VISIT: N/A  OBJECTIVE:  Note: Objective measures were  completed at Evaluation unless otherwise noted.  DIAGNOSTIC FINDINGS: N/A  PATIENT SURVEYS:  FOTO deferred to next session  COGNITION: Overall cognitive status: Within functional limits for tasks assessed     SENSATION: WFL  POSTURE: weight shift right  PALPATION: Mild TTP at LLE 1st MTP joint  LOWER EXTREMITY ROM:   AROM/PROM Right eval Left eval  Hip flexion 120 112  Hip extension 12 4  Hip abduction    Hip adduction    Hip internal rotation 40 40  Hip external rotation 45 42  Knee flexion    Knee extension    Ankle dorsiflexion 10 ~60-70 deg in plantar flexion 0 PROM  Ankle plantarflexion    Ankle inversion    Ankle eversion     (Blank rows = not tested)  LOWER EXTREMITY MMT:  MMT Right eval Left eval  Hip flexion 5 4  Hip extension 4 3+  Hip abduction 4 3+  Hip adduction    Hip internal rotation 5 5  Hip external rotation 5 5  Knee flexion 5 5  Knee extension 5 5  Ankle dorsiflexion 5 0  Ankle plantarflexion  Ankle inversion    Ankle eversion     (Blank rows = not tested)  LOWER EXTREMITY SPECIAL TESTS:  NA  FUNCTIONAL TESTS:  30 second STS test: 13 reps  FGA: 26/30  Single leg 1 RM leg press R/L: 115 #/85# MiniBesTest: Deferred to next session; 03/16/23: 21/28  GAIT: Distance walked: 10 meters Assistive device utilized: None Level of assistance: Complete Independence Comments: Normalized, reciprocal gait except for L ankle drop foot with no eccentric control with initial contact.                                                                                                                                TREATMENT DATE: 03/16/23  Physical tests/Measures:   Reassessment of BLE strength, hip extension AROM, single limb strength, performed minibestest.      PATIENT EDUCATION:  Education details: HEP (reps/sets/frequency), POC, Prognosis Person educated: Patient Education method: Explanation, Demonstration, and Handouts Education  comprehension: verbalized understanding and needs further education  HOME EXERCISE PROGRAM: Access Code: [email or web address] URL: [email or web address] Date: 03/16/2023 Prepared by: Ronnie Derby  Exercises - Mini Squat  - 1 x daily - 4-5 x weekly - 3 sets - 8 reps - Standing Hip Abduction with Resistance at Ankles and Counter Support  - 1 x daily - 4-5 x weekly - 3 sets - 8 reps - Single Leg Stance  - 1 x daily - 7 x weekly - 1 sets - 3 reps - 20 hold - Standing Alternating Partial Lunge  - 1 x daily - 3-4 x weekly - 3 sets - 6 reps    Access Code: [email or web address] URL: [email or web address] Date: 01/21/2023 Prepared by: Ronnie Derby  Exercises - Mini Squat  - 1 x daily - 4-5 x weekly - 3 sets - 8 reps - Standing Hip Abduction with Resistance at Ankles and Counter Support  - 1 x daily - 4-5 x weekly - 3 sets - 8 reps  ASSESSMENT:  CLINICAL IMPRESSION: Patient returning to PT after a hiatus due to work schedule constraints. Focus of session on reviewing prior made goals from previous POC and in addition to assessing dynamic balance via MiniBestTest. Pt improving in hip extension AROM, BLE strength as evident by 30 second STS test. However pt remains grossly weaker on LLE with single leg strength testing due to post op weakness from fall of 2024. Pt remains most challenged in his dynamic balance with anticipatory and reactive postural control specifically with backwards and left sided compensatory stepping indicative of falls risk if pat has balance loss requiring stepping strategy to correct in those planes. Pt also displaying limitations in SLS times and vestibular input to balance with unstable surfaces and eyes closed with heavy ankle and hip righting reactions. Pt has made some improvements in his POC but remains a falls risk with dynamic, community level gait activities. These deficits are increasing pt's risk for falls and future injury thus will benefit from skilled PT  services to address these impairments and maximize return to PLOF.   OBJECTIVE IMPAIRMENTS: Abnormal gait, decreased mobility, decreased ROM, and decreased strength.   ACTIVITY LIMITATIONS: squatting, stairs, and locomotion level  PARTICIPATION LIMITATIONS: community activity and occupation  PERSONAL FACTORS: Age, Fitness, Past/current experiences, and Time since onset of injury/illness/exacerbation are also affecting patient's functional outcome.   REHAB POTENTIAL: Excellent  CLINICAL DECISION MAKING: Stable/uncomplicated  EVALUATION COMPLEXITY: Low   GOALS: Goals reviewed with patient? No  SHORT TERM GOALS: Target date: 04/13/23 Pt will be independent with HEP to improve hip AROM and strength to return to PLOF. Baseline: 01/21/23: provided HEP; 03/16/23: Compliant with HEP about 50% Goal status: PARTIALLY MET  2.  Pt will demonstrate improved L hip extension equal to R hip to demo significant improvement in ability to produce greater power production with transfers and gait.  Baseline: 01/21/23: R/L: 12/4 degrees; 03/16/23: 15/8 degrees Goal status: ON GOING  LONG TERM GOALS: Target date: 05/11/23  Pt will improve FOTO to target score to demonstrate clinically significant improvement in functional mobility.  Baseline: 01/21/23: deferred to next session; 03/16/23: no longer used in this clinic Goal status: DEFERRED  2.  Pt will improve 30 sec STS to age matched norms to demonstrate improve LE power production and endurance for transfers and gait. Baseline: 01/21/23: 13 reps; 03/16/23: 16 reps Goal status: ON GOING  3.  Pt will improve LLE 1RM leg press to RLE to demo return LLE symmetrical strength to non involved limb.  Baseline: 01/21/23: 115#/85# R/L; 03/16/23: LLE 85# Goal status: ON GOING  4.  Pt will improve MiniBES Test by at least 4 points to demonstrate significant improvement in reduced falls risk with dynamic balance activities in community.  Baseline: 01/21/23:  Deferred to next session; 03/16/23: 21/28 Goal status: INITIAL   PLAN:  PT FREQUENCY: 1-2x/week  PT DURATION: 8 weeks  PLANNED INTERVENTIONS: 97164- PT Re-evaluation, 97110-Therapeutic exercises, 97530- Therapeutic activity, 97112- Neuromuscular re-education, 97535- Self Care, 16109- Manual therapy, [phone]- Gait training, Patient/Family education, Balance training, Stair training, Dry Needling, Joint mobilization, Cryotherapy, and Moist heat  PLAN FOR NEXT SESSION: LLE strengthening. Address dynamic balance impairments per minibestest results   Delphia Grates. Fairly IV, PT, DPT Physical Therapist- [place]  [facility]  03/16/2023, 12:07 PM

## 2023-03-17 ENCOUNTER — Encounter: Payer: BC Managed Care – PPO | Admitting: Gastroenterology

## 2023-03-17 DIAGNOSIS — F331 Major depressive disorder, recurrent, moderate: Secondary | ICD-10-CM | POA: Diagnosis not present

## 2023-03-17 DIAGNOSIS — M25572 Pain in left ankle and joints of left foot: Secondary | ICD-10-CM | POA: Diagnosis not present

## 2023-03-22 ENCOUNTER — Ambulatory Visit: Payer: BC Managed Care – PPO

## 2023-03-22 DIAGNOSIS — M6281 Muscle weakness (generalized): Secondary | ICD-10-CM

## 2023-03-22 DIAGNOSIS — M25552 Pain in left hip: Secondary | ICD-10-CM

## 2023-03-22 DIAGNOSIS — R262 Difficulty in walking, not elsewhere classified: Secondary | ICD-10-CM

## 2023-03-22 NOTE — Therapy (Signed)
OUTPATIENT PHYSICAL THERAPY LOWER EXTREMITY TREATMENT   Patient Name: Thomas Williamson MRN: [id number] DOB:Jun 03, 1975, 48 y.o., male Today's Date: 03/22/2023  END OF SESSION:  PT End of Session - 03/22/23 0947     Visit Number 3    Number of Visits 13    Date for PT Re-Evaluation 05/11/23    PT Start Time 0946    PT Stop Time 1030    PT Time Calculation (min) 44 min    Activity Tolerance Patient tolerated treatment well    Behavior During Therapy [facility] for tasks assessed/performed             Past Medical History:  Diagnosis Date   Acetabulum fracture, left (HCC) 06/30/2022   Alcohol use disorder, severe, in early remission (HCC) 03/30/2018   Allergy    Anxiety    Bipolar disorder, in partial remission, most recent episode hypomanic (HCC) 03/30/2018   [facility]   Bleeding internal hemorrhoids 09/12/2013   Cannabis dependence (HCC) 07/13/2018   Daily use   Cervical disc disease 11/20/2014   Chronic anxiety 07/13/2018   Chronic systolic (congestive) heart failure (HCC)    Complication of anesthesia    PONV   Depression    Dyslipidemia    GERD (gastroesophageal reflux disease)    Hernia, inguinal, bilateral 02/24/2011   High ankle sprain of right lower extremity 01/16/2015   History of hiatal hernia    Hx of hepatitis C 10/05/2017   -treated in 2019 -hepatology recommended no further surveillance needed except for LFTs with labs and see hepatologist if elevated   Hypertension    Lipids abnormal 09/29/2013   Pneumonia    PONV (postoperative nausea and vomiting)    Vitamin D deficiency 07/01/2022   Past Surgical History:  Procedure Laterality Date   ANTERIOR CRUCIATE LIGAMENT REPAIR  2010   BIOPSY  03/13/2019   Procedure: BIOPSY;  Surgeon: Benancio Deeds, MD;  Location: [facility];  Service: Gastroenterology;;   COLONOSCOPY N/A 03/13/2019   per Dr. Adela Lank, adenomatous polyps, repeat in 3 yrs   COLONOSCOPY WITH PROPOFOL  N/A 02/11/2023   Procedure: COLONOSCOPY WITH PROPOFOL;  Surgeon: Benancio Deeds, MD;  Location: [facility];  Service: Gastroenterology;  Laterality: N/A;   INGUINAL HERNIA REPAIR  02/23/2012   Procedure: LAPAROSCOPIC BILATERAL INGUINAL HERNIA REPAIR;  Surgeon: Kandis Cocking, MD;  Location: [facility];  Service: General;  Laterality: Bilateral;  Laparoscopic Bilateral Inguinal Hernia Repair with mesh   INSERTION OF MESH  02/23/2012   Procedure: INSERTION OF MESH;  Surgeon: Kandis Cocking, MD;  Location: [facility];  Service: General;  Laterality: N/A;   NOSE SURGERY  [id number]   ORIF ACETABULAR FRACTURE Left 06/30/2022   Procedure: OPEN REDUCTION INTERNAL FIXATION (ORIF) ACETABULAR FRACTURE;  Surgeon: Myrene Galas, MD;  Location: [facility];  Service: Orthopedics;  Laterality: Left;   POLYPECTOMY  03/13/2019   Procedure: POLYPECTOMY;  Surgeon: Benancio Deeds, MD;  Location: [facility];  Service: Gastroenterology;;   POLYPECTOMY  02/11/2023   Procedure: POLYPECTOMY;  Surgeon: Benancio Deeds, MD;  Location: [facility];  Service: Gastroenterology;;   Patient Active Problem List   Diagnosis Date Noted   Abnormal LFTs 07/08/2022   Left foot drop 07/01/2022   Vitamin D deficiency 07/01/2022   Acetabulum fracture, left (HCC) 06/30/2022   Alcohol dependence (HCC) 01/05/2022   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   Alcohol-induced acute pancreatitis without infection or necrosis 12/05/2021   Genetic testing 04/21/2019  Benign neoplasm of colon    Loose stools    History of colonic polyps 03/07/2019   Cardiac left ventricular ejection fraction 30-35 percent 03/07/2019   Alcoholic ketoacidosis 11/18/2018   Dyslipidemia 09/16/2018   Fever blister 09/16/2018   Cardiomyopathy, alcoholic (HCC) 09/16/2018   Left bundle branch block (LBBB) 09/16/2018   Cannabis dependence (HCC) 07/13/2018   Chronic anxiety 07/13/2018   Bipolar 1 disorder, depressed, full remission (HCC)  03/30/2018   Cervical disc disease 11/20/2014   Hypertension 02/24/2011    PCP: Nelwyn Salisbury, MD  REFERRING PROVIDER: Montez Morita PA-C  REFERRING DIAG:  Z61.096E (ICD-10-CM) - Closed fracture of acetabulum with dislocation of hip with delayed healing, left  M21.372 (ICD-10-CM) - Left foot drop    THERAPY DIAG:  Pain in left hip  Muscle weakness (generalized)  Difficulty in walking, not elsewhere classified  Rationale for Evaluation and Treatment: Rehabilitation  ONSET DATE: 06/30/22  SUBJECTIVE:   SUBJECTIVE STATEMENT: Pt reports he is doing well overall. Turning is getting better on it's own. Denies falls.  PERTINENT HISTORY: Pt returning from "mental health" program in [place]. Reports still dealing with L foot drop. Planning to see foot specialist. Denies L hip pain but still having dorsal pain on his foot like before. Reports walking a lot in [place] but not a lot of strength training. Reports still dealing with his dynamic balance relying on stepping strategy to correct balance, still feels his LLE strength is lesser than RLE. Reports having 2 falls while away. Tripped his L toe carrying his laundry basket one time and then tripping going up the stairs. Does state he was not wearing his ankle brace those two times. Also having trouble with balance when applied to unstable surfaces especially combined with cognitive dual tasking.   PAIN:  Are you having pain? No  PRECAUTIONS: None  RED FLAGS: None   WEIGHT BEARING RESTRICTIONS: No  FALLS:  Has patient fallen in last 6 months? Yes. Number of falls 2  LIVING ENVIRONMENT: Lives with: lives with their family Lives in: House/apartment Stairs: No Has following equipment at home: Dan Humphreys - 2 wheeled  OCCUPATION: Press photographer for disability  PLOF: Independent  PATIENT GOALS: Improve balance and strength   NEXT MD VISIT: N/A  OBJECTIVE:  Note: Objective measures were completed at Evaluation unless  otherwise noted.  DIAGNOSTIC FINDINGS: N/A  PATIENT SURVEYS:  FOTO deferred to next session  COGNITION: Overall cognitive status: Within functional limits for tasks assessed     SENSATION: WFL  POSTURE: weight shift right  PALPATION: Mild TTP at LLE 1st MTP joint  LOWER EXTREMITY ROM:   AROM/PROM Right eval Left eval  Hip flexion 120 112  Hip extension 12 4  Hip abduction    Hip adduction    Hip internal rotation 40 40  Hip external rotation 45 42  Knee flexion    Knee extension    Ankle dorsiflexion 10 ~60-70 deg in plantar flexion 0 PROM  Ankle plantarflexion    Ankle inversion    Ankle eversion     (Blank rows = not tested)  LOWER EXTREMITY MMT:  MMT Right eval Left eval  Hip flexion 5 4  Hip extension 4 3+  Hip abduction 4 3+  Hip adduction    Hip internal rotation 5 5  Hip external rotation 5 5  Knee flexion 5 5  Knee extension 5 5  Ankle dorsiflexion 5 0  Ankle plantarflexion    Ankle inversion    Ankle  eversion     (Blank rows = not tested)  LOWER EXTREMITY SPECIAL TESTS:  NA  FUNCTIONAL TESTS:  30 second STS test: 13 reps  FGA: 26/30  Single leg 1 RM leg press R/L: 115 #/85# MiniBesTest: Deferred to next session; 03/16/23: 21/28  GAIT: Distance walked: 10 meters Assistive device utilized: None Level of assistance: Complete Independence Comments: Normalized, reciprocal gait except for L ankle drop foot with no eccentric control with initial contact.                                                                                                                                TREATMENT DATE: 03/22/23  Neuro Re-Ed:   SLS: 3x30 sec/LE. PRN light UE support due to ankle righting reactions    Incline board static standing:    Head turns horizontal/vertical:    Eyes closed: 2x 1 minute, SBA to CGA   Alternating 8" step ups with contralateral hip flexion: 2x8/side. Intermittent stepping strategy/touching foot on step due to decreased SLS  times. Fast speed due to imbalance. PT demo and VC's to rely on 2 finger support with improved carryover.    Ambulating over red/yellow mat with 4 hedgehog balls underneath working on 180 degree turns. Progressed to cognitive dual tasking counting by 6's from 100. 5 minutes total SBA    Backwards ambulation 10 meters with head turns: x4 horizontal, x4 vertical, SBA.    There.Act:   Alternating lunges with SUE support: 3x6/LE, intermittent stepping strategy to correct.    Single limb leg press: LLE, 75#, 3x5 (90% of 1RM)  PATIENT EDUCATION:  Education details: HEP (reps/sets/frequency), POC, Prognosis Person educated: Patient Education method: Explanation, Demonstration, and Handouts Education comprehension: verbalized understanding and needs further education  HOME EXERCISE PROGRAM: Access Code: [email or web address] URL: [email or web address] Date: 03/16/2023 Prepared by: Ronnie Derby  Exercises - Mini Squat  - 1 x daily - 4-5 x weekly - 3 sets - 8 reps - Standing Hip Abduction with Resistance at Ankles and Counter Support  - 1 x daily - 4-5 x weekly - 3 sets - 8 reps - Single Leg Stance  - 1 x daily - 7 x weekly - 1 sets - 3 reps - 20 hold - Standing Alternating Partial Lunge  - 1 x daily - 3-4 x weekly - 3 sets - 6 reps    Access Code: [email or web address] URL: [email or web address] Date: 01/21/2023 Prepared by: Ronnie Derby  Exercises - Mini Squat  - 1 x daily - 4-5 x weekly - 3 sets - 8 reps - Standing Hip Abduction with Resistance at Ankles and Counter Support  - 1 x daily - 4-5 x weekly - 3 sets - 8 reps  ASSESSMENT:  CLINICAL IMPRESSION: Pt arriving in good spirits. High level, dynamic balance exercises performed utilizing gait with motor dual tasking and cognitive dual tasking. Pt displays mild stepping strategy reactions laterally and varied step lengths but overall maintains balance with all  exercises. Pt able to complete single limb strengthening on LLE at  90% of 1 RM. Encouraged pt to complete updated HEP with single limb stance and alternating lunges. These deficits are increasing pt's risk for falls and future injury thus will benefit from skilled PT services to address these impairments and maximize return to PLOF.   OBJECTIVE IMPAIRMENTS: Abnormal gait, decreased mobility, decreased ROM, and decreased strength.   ACTIVITY LIMITATIONS: squatting, stairs, and locomotion level  PARTICIPATION LIMITATIONS: community activity and occupation  PERSONAL FACTORS: Age, Fitness, Past/current experiences, and Time since onset of injury/illness/exacerbation are also affecting patient's functional outcome.   REHAB POTENTIAL: Excellent  CLINICAL DECISION MAKING: Stable/uncomplicated  EVALUATION COMPLEXITY: Low   GOALS: Goals reviewed with patient? No  SHORT TERM GOALS: Target date: 04/13/23 Pt will be independent with HEP to improve hip AROM and strength to return to PLOF. Baseline: 01/21/23: provided HEP; 03/16/23: Compliant with HEP about 50% Goal status: PARTIALLY MET  2.  Pt will demonstrate improved L hip extension equal to R hip to demo significant improvement in ability to produce greater power production with transfers and gait.  Baseline: 01/21/23: R/L: 12/4 degrees; 03/16/23: 15/8 degrees Goal status: ON GOING  LONG TERM GOALS: Target date: 05/11/23  Pt will improve FOTO to target score to demonstrate clinically significant improvement in functional mobility.  Baseline: 01/21/23: deferred to next session; 03/16/23: no longer used in this clinic Goal status: DEFERRED  2.  Pt will improve 30 sec STS to age matched norms to demonstrate improve LE power production and endurance for transfers and gait. Baseline: 01/21/23: 13 reps; 03/16/23: 16 reps Goal status: ON GOING  3.  Pt will improve LLE 1RM leg press to RLE to demo return LLE symmetrical strength to non involved limb.  Baseline: 01/21/23: 115#/85# R/L; 03/16/23: LLE 85# Goal status:  ON GOING  4.  Pt will improve MiniBES Test by at least 4 points to demonstrate significant improvement in reduced falls risk with dynamic balance activities in community.  Baseline: 01/21/23: Deferred to next session; 03/16/23: 21/28 Goal status: INITIAL   PLAN:  PT FREQUENCY: 1-2x/week  PT DURATION: 8 weeks  PLANNED INTERVENTIONS: [id number]- PT Re-evaluation, 97110-Therapeutic exercises, 97530- Therapeutic activity, 97112- Neuromuscular re-education, 97535- Self Care, 16109- Manual therapy, [phone]- Gait training, Patient/Family education, Balance training, Stair training, Dry Needling, Joint mobilization, Cryotherapy, and Moist heat  PLAN FOR NEXT SESSION: LLE strengthening. Dual tasking (cognitive and motor), unstable surfaces and single limb stance.   Delphia Grates. Fairly IV, PT, DPT Physical Therapist- [place]  [facility]  03/22/2023, 10:40 AM

## 2023-03-24 DIAGNOSIS — S32462D Displaced associated transverse-posterior fracture of left acetabulum, subsequent encounter for fracture with routine healing: Secondary | ICD-10-CM | POA: Diagnosis not present

## 2023-03-24 DIAGNOSIS — S73015D Posterior dislocation of left hip, subsequent encounter: Secondary | ICD-10-CM | POA: Diagnosis not present

## 2023-03-24 DIAGNOSIS — F331 Major depressive disorder, recurrent, moderate: Secondary | ICD-10-CM | POA: Diagnosis not present

## 2023-03-25 ENCOUNTER — Other Ambulatory Visit: Payer: Self-pay | Admitting: Family Medicine

## 2023-03-29 ENCOUNTER — Ambulatory Visit: Payer: BC Managed Care – PPO

## 2023-03-29 DIAGNOSIS — M25552 Pain in left hip: Secondary | ICD-10-CM

## 2023-03-29 DIAGNOSIS — M6281 Muscle weakness (generalized): Secondary | ICD-10-CM | POA: Diagnosis not present

## 2023-03-29 DIAGNOSIS — R262 Difficulty in walking, not elsewhere classified: Secondary | ICD-10-CM | POA: Diagnosis not present

## 2023-03-29 NOTE — Therapy (Signed)
 OUTPATIENT PHYSICAL THERAPY LOWER EXTREMITY TREATMENT   Patient Name: Thomas Williamson MRN: [id number] DOB:1975/07/04, 48 y.o., male Today's Date: 03/29/2023  END OF SESSION:  PT End of Session - 03/29/23 1117     Visit Number 4    Number of Visits 13    Date for PT Re-Evaluation 05/11/23    PT Start Time 1116    PT Stop Time 1200    PT Time Calculation (min) 44 min    Activity Tolerance Patient tolerated treatment well    Behavior During Therapy [facility] for tasks assessed/performed             Past Medical History:  Diagnosis Date   Acetabulum fracture, left (HCC) 06/30/2022   Alcohol use disorder, severe, in early remission (HCC) 03/30/2018   Allergy    Anxiety    Bipolar disorder, in partial remission, most recent episode hypomanic (HCC) 03/30/2018   [facility]   Bleeding internal hemorrhoids 09/12/2013   Cannabis dependence (HCC) 07/13/2018   Daily use   Cervical disc disease 11/20/2014   Chronic anxiety 07/13/2018   Chronic systolic (congestive) heart failure (HCC)    Complication of anesthesia    PONV   Depression    Dyslipidemia    GERD (gastroesophageal reflux disease)    Hernia, inguinal, bilateral 02/24/2011   High ankle sprain of right lower extremity 01/16/2015   History of hiatal hernia    Hx of hepatitis C 10/05/2017   -treated in 2019 -hepatology recommended no further surveillance needed except for LFTs with labs and see hepatologist if elevated   Hypertension    Lipids abnormal 09/29/2013   Pneumonia    PONV (postoperative nausea and vomiting)    Vitamin D deficiency 07/01/2022   Past Surgical History:  Procedure Laterality Date   ANTERIOR CRUCIATE LIGAMENT REPAIR  2010   BIOPSY  03/13/2019   Procedure: BIOPSY;  Surgeon: Benancio Deeds, MD;  Location: [facility];  Service: Gastroenterology;;   COLONOSCOPY N/A 03/13/2019   per Dr. Adela Lank, adenomatous polyps, repeat in 3 yrs   COLONOSCOPY WITH PROPOFOL  N/A 02/11/2023   Procedure: COLONOSCOPY WITH PROPOFOL;  Surgeon: Benancio Deeds, MD;  Location: [facility];  Service: Gastroenterology;  Laterality: N/A;   INGUINAL HERNIA REPAIR  02/23/2012   Procedure: LAPAROSCOPIC BILATERAL INGUINAL HERNIA REPAIR;  Surgeon: Kandis Cocking, MD;  Location: [facility];  Service: General;  Laterality: Bilateral;  Laparoscopic Bilateral Inguinal Hernia Repair with mesh   INSERTION OF MESH  02/23/2012   Procedure: INSERTION OF MESH;  Surgeon: Kandis Cocking, MD;  Location: [facility];  Service: General;  Laterality: N/A;   NOSE SURGERY  [id number]   ORIF ACETABULAR FRACTURE Left 06/30/2022   Procedure: OPEN REDUCTION INTERNAL FIXATION (ORIF) ACETABULAR FRACTURE;  Surgeon: Myrene Galas, MD;  Location: [facility];  Service: Orthopedics;  Laterality: Left;   POLYPECTOMY  03/13/2019   Procedure: POLYPECTOMY;  Surgeon: Benancio Deeds, MD;  Location: [facility];  Service: Gastroenterology;;   POLYPECTOMY  02/11/2023   Procedure: POLYPECTOMY;  Surgeon: Benancio Deeds, MD;  Location: [facility];  Service: Gastroenterology;;   Patient Active Problem List   Diagnosis Date Noted   Abnormal LFTs 07/08/2022   Left foot drop 07/01/2022   Vitamin D deficiency 07/01/2022   Acetabulum fracture, left (HCC) 06/30/2022   Alcohol dependence (HCC) 01/05/2022   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   Alcohol-induced acute pancreatitis without infection or necrosis 12/05/2021   Genetic testing 04/21/2019  Benign neoplasm of colon    Loose stools    History of colonic polyps 03/07/2019   Cardiac left ventricular ejection fraction 30-35 percent 03/07/2019   Alcoholic ketoacidosis 11/18/2018   Dyslipidemia 09/16/2018   Fever blister 09/16/2018   Cardiomyopathy, alcoholic (HCC) 09/16/2018   Left bundle branch block (LBBB) 09/16/2018   Cannabis dependence (HCC) 07/13/2018   Chronic anxiety 07/13/2018   Bipolar 1 disorder, depressed, full remission (HCC)  03/30/2018   Cervical disc disease 11/20/2014   Hypertension 02/24/2011    PCP: Nelwyn Salisbury, MD  REFERRING PROVIDER: Montez Morita PA-C  REFERRING DIAG:  Z61.096E (ICD-10-CM) - Closed fracture of acetabulum with dislocation of hip with delayed healing, left  M21.372 (ICD-10-CM) - Left foot drop    THERAPY DIAG:  Pain in left hip  Muscle weakness (generalized)  Difficulty in walking, not elsewhere classified  Rationale for Evaluation and Treatment: Rehabilitation  ONSET DATE: 06/30/22  SUBJECTIVE:   SUBJECTIVE STATEMENT: Pt reports he is doing well. Got new order form MD to try and get approval for more visits.   PERTINENT HISTORY: Pt returning from "mental health" program in [place]. Reports still dealing with L foot drop. Planning to see foot specialist. Denies L hip pain but still having dorsal pain on his foot like before. Reports walking a lot in [place] but not a lot of strength training. Reports still dealing with his dynamic balance relying on stepping strategy to correct balance, still feels his LLE strength is lesser than RLE. Reports having 2 falls while away. Tripped his L toe carrying his laundry basket one time and then tripping going up the stairs. Does state he was not wearing his ankle brace those two times. Also having trouble with balance when applied to unstable surfaces especially combined with cognitive dual tasking.   PAIN:  Are you having pain? No  PRECAUTIONS: None  RED FLAGS: None   WEIGHT BEARING RESTRICTIONS: No  FALLS:  Has patient fallen in last 6 months? Yes. Number of falls 2  LIVING ENVIRONMENT: Lives with: lives with their family Lives in: House/apartment Stairs: No Has following equipment at home: Dan Humphreys - 2 wheeled  OCCUPATION: Press photographer for disability  PLOF: Independent  PATIENT GOALS: Improve balance and strength   NEXT MD VISIT: N/A  OBJECTIVE:  Note: Objective measures were completed at Evaluation unless  otherwise noted.  DIAGNOSTIC FINDINGS: N/A  PATIENT SURVEYS:  FOTO deferred to next session  COGNITION: Overall cognitive status: Within functional limits for tasks assessed     SENSATION: WFL  POSTURE: weight shift right  PALPATION: Mild TTP at LLE 1st MTP joint  LOWER EXTREMITY ROM:   AROM/PROM Right eval Left eval  Hip flexion 120 112  Hip extension 12 4  Hip abduction    Hip adduction    Hip internal rotation 40 40  Hip external rotation 45 42  Knee flexion    Knee extension    Ankle dorsiflexion 10 ~60-70 deg in plantar flexion 0 PROM  Ankle plantarflexion    Ankle inversion    Ankle eversion     (Blank rows = not tested)  LOWER EXTREMITY MMT:  MMT Right eval Left eval  Hip flexion 5 4  Hip extension 4 3+  Hip abduction 4 3+  Hip adduction    Hip internal rotation 5 5  Hip external rotation 5 5  Knee flexion 5 5  Knee extension 5 5  Ankle dorsiflexion 5 0  Ankle plantarflexion    Ankle inversion  Ankle eversion     (Blank rows = not tested)  LOWER EXTREMITY SPECIAL TESTS:  NA  FUNCTIONAL TESTS:  30 second STS test: 13 reps  FGA: 26/30  Single leg 1 RM leg press R/L: 115 #/85# MiniBesTest: Deferred to next session; 03/16/23: 21/28  GAIT: Distance walked: 10 meters Assistive device utilized: None Level of assistance: Complete Independence Comments: Normalized, reciprocal gait except for L ankle drop foot with no eccentric control with initial contact.                                                                                                                                TREATMENT DATE: 03/29/23  Neuro Re-Ed:   Alternating 8" step ups with contralateral hip flexion: 2x8/side. Intermittent stepping strategy/touching foot on step due to decreased SLS times primarily standing on LLE. Supervision.   SLS on airex pad: 3x30 sec/LE, PRN 2 finger support due to ankle/hip righting reactions on LLE > RLE. Supervision.   Gait 100' laps  with motor/cognitive dual tasking alternating head turns in vertical and horizontal planes, changes in gait speed. Combining gait speed changes with head turns and counting by 4's starting at 100. 5 laps with 3# AW's. SBA.    Gait outdoors for ~5 minutes. Working on Probation officer, walking parallel on hills working on lateral and medial ankle stability, grass walking. Incline/decline navigation near [employer]. SBA. Pt with ankle righting and stepping strategy and CGA needed for unstable surfaces due to poor L ankle stability.    There.Act:   Alternating lunges with SUE support: 4 laps with 4 lunges per LE. Excellent form/technique compared to last session.    Single limb leg press: LLE, 80#, 3x5  PATIENT EDUCATION:  Education details: HEP (reps/sets/frequency), POC, Prognosis Person educated: Patient Education method: Explanation, Demonstration, and Handouts Education comprehension: verbalized understanding and needs further education  HOME EXERCISE PROGRAM: Access Code: [email or web address] URL: [email or web address] Date: 03/16/2023 Prepared by: Ronnie Derby  Exercises - Mini Squat  - 1 x daily - 4-5 x weekly - 3 sets - 8 reps - Standing Hip Abduction with Resistance at Ankles and Counter Support  - 1 x daily - 4-5 x weekly - 3 sets - 8 reps - Single Leg Stance  - 1 x daily - 7 x weekly - 1 sets - 3 reps - 20 hold - Standing Alternating Partial Lunge  - 1 x daily - 3-4 x weekly - 3 sets - 6 reps    Access Code: [email or web address] URL: [email or web address] Date: 01/21/2023 Prepared by: Ronnie Derby  Exercises - Mini Squat  - 1 x daily - 4-5 x weekly - 3 sets - 8 reps - Standing Hip Abduction with Resistance at Ankles and Counter Support  - 1 x daily - 4-5 x weekly - 3 sets - 8 reps  ASSESSMENT:  CLINICAL IMPRESSION: Continuing PT POC with progressions in LLE strength and dynamic balance exercises. Pt with difficulty with primarily  unstable surfaces on hills  for medial/lateral ankle stability needing PT support and stepping strategy to correct. Pt remains limited with SLS activities and unstable surfaces placing pt at falls risk with community level recreational and work related tasks. These deficits are increasing pt's risk for falls and future injury thus will benefit from skilled PT services to address these impairments and maximize return to PLOF.   OBJECTIVE IMPAIRMENTS: Abnormal gait, decreased mobility, decreased ROM, and decreased strength.   ACTIVITY LIMITATIONS: squatting, stairs, and locomotion level  PARTICIPATION LIMITATIONS: community activity and occupation  PERSONAL FACTORS: Age, Fitness, Past/current experiences, and Time since onset of injury/illness/exacerbation are also affecting patient's functional outcome.   REHAB POTENTIAL: Excellent  CLINICAL DECISION MAKING: Stable/uncomplicated  EVALUATION COMPLEXITY: Low   GOALS: Goals reviewed with patient? No  SHORT TERM GOALS: Target date: 04/13/23 Pt will be independent with HEP to improve hip AROM and strength to return to PLOF. Baseline: 01/21/23: provided HEP; 03/16/23: Compliant with HEP about 50% Goal status: PARTIALLY MET  2.  Pt will demonstrate improved L hip extension equal to R hip to demo significant improvement in ability to produce greater power production with transfers and gait.  Baseline: 01/21/23: R/L: 12/4 degrees; 03/16/23: 15/8 degrees Goal status: ON GOING  LONG TERM GOALS: Target date: 05/11/23  Pt will improve FOTO to target score to demonstrate clinically significant improvement in functional mobility.  Baseline: 01/21/23: deferred to next session; 03/16/23: no longer used in this clinic Goal status: DEFERRED  2.  Pt will improve 30 sec STS to age matched norms to demonstrate improve LE power production and endurance for transfers and gait. Baseline: 01/21/23: 13 reps; 03/16/23: 16 reps Goal status: ON GOING  3.  Pt will improve LLE 1RM leg press to  RLE to demo return LLE symmetrical strength to non involved limb.  Baseline: 01/21/23: 115#/85# R/L; 03/16/23: LLE 85# Goal status: ON GOING  4.  Pt will improve MiniBES Test by at least 4 points to demonstrate significant improvement in reduced falls risk with dynamic balance activities in community.  Baseline: 01/21/23: Deferred to next session; 03/16/23: 21/28 Goal status: INITIAL   PLAN:  PT FREQUENCY: 1-2x/week  PT DURATION: 8 weeks  PLANNED INTERVENTIONS: 97164- PT Re-evaluation, 97110-Therapeutic exercises, 97530- Therapeutic activity, 97112- Neuromuscular re-education, 97535- Self Care, 16109- Manual therapy, [phone]- Gait training, Patient/Family education, Balance training, Stair training, Dry Needling, Joint mobilization, Cryotherapy, and Moist heat  PLAN FOR NEXT SESSION: RECERT/discharge. LLE strengthening. Dual tasking (cognitive and motor), unstable surfaces and single limb stance.   Delphia Grates. Fairly IV, PT, DPT Physical Therapist- [place]  [facility]  03/29/2023, 12:07 PM

## 2023-03-30 DIAGNOSIS — F332 Major depressive disorder, recurrent severe without psychotic features: Secondary | ICD-10-CM | POA: Diagnosis not present

## 2023-03-30 DIAGNOSIS — F1029 Alcohol dependence with unspecified alcohol-induced disorder: Secondary | ICD-10-CM | POA: Diagnosis not present

## 2023-03-30 DIAGNOSIS — F411 Generalized anxiety disorder: Secondary | ICD-10-CM | POA: Diagnosis not present

## 2023-04-07 DIAGNOSIS — F331 Major depressive disorder, recurrent, moderate: Secondary | ICD-10-CM | POA: Diagnosis not present

## 2023-04-08 NOTE — Therapy (Signed)
 OUTPATIENT PHYSICAL THERAPY LOWER EXTREMITY TREATMENT   Patient Name: Thomas Williamson MRN: [id number] DOB:1975-11-27, 47 y.o., male Today's Date: 04/09/2023  END OF SESSION:  PT End of Session - 04/09/23 0817     Visit Number 5    Number of Visits 13    Date for PT Re-Evaluation 05/11/23    PT Start Time 0816    PT Stop Time 0857    PT Time Calculation (min) 41 min    Activity Tolerance Patient tolerated treatment well    Behavior During Therapy [facility] for tasks assessed/performed              Past Medical History:  Diagnosis Date   Acetabulum fracture, left (HCC) 06/30/2022   Alcohol use disorder, severe, in early remission (HCC) 03/30/2018   Allergy    Anxiety    Bipolar disorder, in partial remission, most recent episode hypomanic (HCC) 03/30/2018   [facility]   Bleeding internal hemorrhoids 09/12/2013   Cannabis dependence (HCC) 07/13/2018   Daily use   Cervical disc disease 11/20/2014   Chronic anxiety 07/13/2018   Chronic systolic (congestive) heart failure (HCC)    Complication of anesthesia    PONV   Depression    Dyslipidemia    GERD (gastroesophageal reflux disease)    Hernia, inguinal, bilateral 02/24/2011   High ankle sprain of right lower extremity 01/16/2015   History of hiatal hernia    Hx of hepatitis C 10/05/2017   -treated in 2019 -hepatology recommended no further surveillance needed except for LFTs with labs and see hepatologist if elevated   Hypertension    Lipids abnormal 09/29/2013   Pneumonia    PONV (postoperative nausea and vomiting)    Vitamin D deficiency 07/01/2022   Past Surgical History:  Procedure Laterality Date   ANTERIOR CRUCIATE LIGAMENT REPAIR  2010   BIOPSY  03/13/2019   Procedure: BIOPSY;  Surgeon: Benancio Deeds, MD;  Location: [facility];  Service: Gastroenterology;;   COLONOSCOPY N/A 03/13/2019   per Dr. Adela Lank, adenomatous polyps, repeat in 3 yrs   COLONOSCOPY WITH  PROPOFOL N/A 02/11/2023   Procedure: COLONOSCOPY WITH PROPOFOL;  Surgeon: Benancio Deeds, MD;  [facility]: [facility];  Service: Gastroenterology;  Laterality: N/A;   INGUINAL HERNIA REPAIR  02/23/2012   Procedure: LAPAROSCOPIC BILATERAL INGUINAL HERNIA REPAIR;  Surgeon: Kandis Cocking, MD;  Location: [facility];  Service: General;  Laterality: Bilateral;  Laparoscopic Bilateral Inguinal Hernia Repair with mesh   INSERTION OF MESH  02/23/2012   Procedure: INSERTION OF MESH;  Surgeon: Kandis Cocking, MD;  Location: [facility];  Service: General;  Laterality: N/A;   NOSE SURGERY  4098,1191   ORIF ACETABULAR FRACTURE Left 06/30/2022   Procedure: OPEN REDUCTION INTERNAL FIXATION (ORIF) ACETABULAR FRACTURE;  Surgeon: Myrene Galas, MD;  [facility];  Service: Orthopedics;  Laterality: Left;   POLYPECTOMY  03/13/2019   Procedure: POLYPECTOMY;  Surgeon: Benancio Deeds, MD;  [facility];  Service: Gastroenterology;;   POLYPECTOMY  02/11/2023   Procedure: POLYPECTOMY;  Surgeon: Benancio Deeds, MD;  [facility];  Service: Gastroenterology;;   Patient Active Problem List   Diagnosis Date Noted   Abnormal LFTs 07/08/2022   Left foot drop 07/01/2022   Vitamin D deficiency 07/01/2022   Acetabulum fracture, left (HCC) 06/30/2022   Alcohol dependence (HCC) 01/05/2022   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   Alcohol-induced acute pancreatitis without infection or necrosis 12/05/2021   Genetic testing 04/21/2019  Benign neoplasm of colon    Loose stools    History of colonic polyps 03/07/2019   Cardiac left ventricular ejection fraction 30-35 percent 03/07/2019   Alcoholic ketoacidosis 11/18/2018   Dyslipidemia 09/16/2018   Fever blister 09/16/2018   Cardiomyopathy, alcoholic (HCC) 09/16/2018   Left bundle branch block (LBBB) 09/16/2018   Cannabis dependence (HCC) 07/13/2018   Chronic anxiety 07/13/2018   Bipolar 1 disorder, depressed, full remission  (HCC) 03/30/2018   Cervical disc disease 11/20/2014   Hypertension 02/24/2011    PCP: Nelwyn Salisbury, MD  REFERRING PROVIDER: Montez Morita PA-C  REFERRING DIAG:  G95.621H (ICD-10-CM) - Closed fracture of acetabulum with dislocation of hip with delayed healing, left  M21.372 (ICD-10-CM) - Left foot drop    THERAPY DIAG:  Pain in left hip  Difficulty in walking, not elsewhere classified  Muscle weakness (generalized)  Rationale for Evaluation and Treatment: Rehabilitation  ONSET DATE: 06/30/22  SUBJECTIVE:   SUBJECTIVE STATEMENT:   Patient reports being frustrated about insurance. Has not been compliant with HEP.   PERTINENT HISTORY: Pt returning from "mental health" program in [place]. Reports still dealing with L foot drop. Planning to see foot specialist. Denies L hip pain but still having dorsal pain on his foot like before. Reports walking a lot in [place] but not a lot of strength training. Reports still dealing with his dynamic balance relying on stepping strategy to correct balance, still feels his LLE strength is lesser than RLE. Reports having 2 falls while away. Tripped his L toe carrying his laundry basket one time and then tripping going up the stairs. Does state he was not wearing his ankle brace those two times. Also having trouble with balance when applied to unstable surfaces especially combined with cognitive dual tasking.   PAIN:  Are you having pain? No  PRECAUTIONS: None  RED FLAGS: None   WEIGHT BEARING RESTRICTIONS: No  FALLS:  Has patient fallen in last 6 months? Yes. Number of falls 2  LIVING ENVIRONMENT: Lives with: lives with their family Lives in: House/apartment Stairs: No Has following equipment at home: Dan Humphreys - 2 wheeled  OCCUPATION: Press photographer for disability  PLOF: Independent  PATIENT GOALS: Improve balance and strength   NEXT MD VISIT: N/A  OBJECTIVE:  Note: Objective measures were completed at Evaluation unless  otherwise noted.  DIAGNOSTIC FINDINGS: N/A  PATIENT SURVEYS:  FOTO deferred to next session  COGNITION: Overall cognitive status: Within functional limits for tasks assessed     SENSATION: WFL  POSTURE: weight shift right  PALPATION: Mild TTP at LLE 1st MTP joint  LOWER EXTREMITY ROM:   AROM/PROM Right eval Left eval  Hip flexion 120 112  Hip extension 12 4  Hip abduction    Hip adduction    Hip internal rotation 40 40  Hip external rotation 45 42  Knee flexion    Knee extension    Ankle dorsiflexion 10 ~60-70 deg in plantar flexion 0 PROM  Ankle plantarflexion    Ankle inversion    Ankle eversion     (Blank rows = not tested)  LOWER EXTREMITY MMT:  MMT Right eval Left eval  Hip flexion 5 4  Hip extension 4 3+  Hip abduction 4 3+  Hip adduction    Hip internal rotation 5 5  Hip external rotation 5 5  Knee flexion 5 5  Knee extension 5 5  Ankle dorsiflexion 5 0  Ankle plantarflexion    Ankle inversion    Ankle eversion     (  Blank rows = not tested)  LOWER EXTREMITY SPECIAL TESTS:  NA  FUNCTIONAL TESTS:  30 second STS test: 13 reps  FGA: 26/30  Single leg 1 RM leg press R/L: 115 #/85# MiniBesTest: Deferred to next session; 03/16/23: 21/28  GAIT: Distance walked: 10 meters Assistive device utilized: None Level of assistance: Complete Independence Comments: Normalized, reciprocal gait except for L ankle drop foot with no eccentric control with initial contact.                                                                                                                                TREATMENT DATE: 03/29/23   There.Act:   Nustep level 3-5 x 7 minutes to improve cardiorespiratory endurance and BLE strengthening   Double limb leg press 115#  x 15, x 10   Single limb leg press: LLE, 75#, 2x5   Matrix hip abduction 40# 2 x 10 each LE    Alternating lunges with  no UE support: 4 laps with 4 lunges per LE. Excellent form/technique compared to  last session.   Banded monster walks with GTB x 4 laps   PATIENT EDUCATION:  Education details: HEP (reps/sets/frequency), POC, Prognosis Person educated: Patient Education method: Explanation, Demonstration, and Handouts Education comprehension: verbalized understanding and needs further education  HOME EXERCISE PROGRAM: Access Code: [email or web address] URL: [email or web address] Date: 03/16/2023 Prepared by: Ronnie Derby  Exercises - Mini Squat  - 1 x daily - 4-5 x weekly - 3 sets - 8 reps - Standing Hip Abduction with Resistance at Ankles and Counter Support  - 1 x daily - 4-5 x weekly - 3 sets - 8 reps - Single Leg Stance  - 1 x daily - 7 x weekly - 1 sets - 3 reps - 20 hold - Standing Alternating Partial Lunge  - 1 x daily - 3-4 x weekly - 3 sets - 6 reps    Access Code: [email or web address] URL: [email or web address] Date: 01/21/2023 Prepared by: Ronnie Derby  Exercises - Mini Squat  - 1 x daily - 4-5 x weekly - 3 sets - 8 reps - Standing Hip Abduction with Resistance at Ankles and Counter Support  - 1 x daily - 4-5 x weekly - 3 sets - 8 reps  ASSESSMENT:  CLINICAL IMPRESSION:   Session focused on BLE strengthening with resistance and introduction to increasing resistance for hip abductor strengthening. Continues to have difficulty with single leg activities. These deficits are increasing pt's risk for falls and future injury thus will benefit from skilled PT services to address these impairments and maximize return to PLOF.   OBJECTIVE IMPAIRMENTS: Abnormal gait, decreased mobility, decreased ROM, and decreased strength.   ACTIVITY LIMITATIONS: squatting, stairs, and locomotion level  PARTICIPATION LIMITATIONS: community activity and occupation  PERSONAL FACTORS: Age, Fitness, Past/current experiences, and Time since onset of injury/illness/exacerbation are also affecting patient's functional outcome.   REHAB POTENTIAL: Excellent  CLINICAL DECISION  MAKING: Stable/uncomplicated  EVALUATION COMPLEXITY: Low  GOALS: Goals reviewed with patient? No  SHORT TERM GOALS: Target date: 04/13/23 Pt will be independent with HEP to improve hip AROM and strength to return to PLOF. Baseline: 01/21/23: provided HEP; 03/16/23: Compliant with HEP about 50% Goal status: PARTIALLY MET  2.  Pt will demonstrate improved L hip extension equal to R hip to demo significant improvement in ability to produce greater power production with transfers and gait.  Baseline: 01/21/23: R/L: 12/4 degrees; 03/16/23: 15/8 degrees Goal status: ON GOING  LONG TERM GOALS: Target date: 05/11/23  Pt will improve FOTO to target score to demonstrate clinically significant improvement in functional mobility.  Baseline: 01/21/23: deferred to next session; 03/16/23: no longer used in this clinic Goal status: DEFERRED  2.  Pt will improve 30 sec STS to age matched norms to demonstrate improve LE power production and endurance for transfers and gait. Baseline: 01/21/23: 13 reps; 03/16/23: 16 reps Goal status: ON GOING  3.  Pt will improve LLE 1RM leg press to RLE to demo return LLE symmetrical strength to non involved limb.  Baseline: 01/21/23: 115#/85# R/L; 03/16/23: LLE 85# Goal status: ON GOING  4.  Pt will improve MiniBES Test by at least 4 points to demonstrate significant improvement in reduced falls risk with dynamic balance activities in community.  Baseline: 01/21/23: Deferred to next session; 03/16/23: 21/28 Goal status: INITIAL   PLAN:  PT FREQUENCY: 1-2x/week  PT DURATION: 8 weeks  PLANNED INTERVENTIONS: 97164- PT Re-evaluation, 97110-Therapeutic exercises, 97530- Therapeutic activity, 97112- Neuromuscular re-education, 97535- Self Care, 16109- Manual therapy, [phone]- Gait training, Patient/Family education, Balance training, Stair training, Dry Needling, Joint mobilization, Cryotherapy, and Moist heat  PLAN FOR NEXT SESSION: LLE strengthening. Dual tasking  (cognitive and motor), unstable surfaces and single limb stance.   Maylon Peppers, PT, DPT Physical Therapist- [facility]  04/09/2023, 8:17 AM

## 2023-04-09 ENCOUNTER — Ambulatory Visit: Payer: BC Managed Care – PPO | Attending: Orthopedic Surgery

## 2023-04-09 DIAGNOSIS — M6281 Muscle weakness (generalized): Secondary | ICD-10-CM | POA: Insufficient documentation

## 2023-04-09 DIAGNOSIS — M25552 Pain in left hip: Secondary | ICD-10-CM | POA: Insufficient documentation

## 2023-04-09 DIAGNOSIS — R262 Difficulty in walking, not elsewhere classified: Secondary | ICD-10-CM | POA: Diagnosis not present

## 2023-04-13 ENCOUNTER — Ambulatory Visit: Payer: BC Managed Care – PPO

## 2023-04-15 ENCOUNTER — Ambulatory Visit

## 2023-04-15 DIAGNOSIS — M25552 Pain in left hip: Secondary | ICD-10-CM | POA: Diagnosis not present

## 2023-04-15 DIAGNOSIS — M6281 Muscle weakness (generalized): Secondary | ICD-10-CM | POA: Diagnosis not present

## 2023-04-15 DIAGNOSIS — F331 Major depressive disorder, recurrent, moderate: Secondary | ICD-10-CM | POA: Diagnosis not present

## 2023-04-15 DIAGNOSIS — R262 Difficulty in walking, not elsewhere classified: Secondary | ICD-10-CM

## 2023-04-15 NOTE — Therapy (Signed)
 OUTPATIENT PHYSICAL THERAPY LOWER EXTREMITY TREATMENT   Patient Name: Thomas Williamson MRN: [id number] DOB:1975-06-08, 48 y.o., male Today's Date: 04/15/2023  END OF SESSION:  PT End of Session - 04/15/23 1508     Visit Number 6    Number of Visits 13    Date for PT Re-Evaluation 05/11/23    PT Start Time 1510    PT Stop Time 1544    PT Time Calculation (min) 34 min    Activity Tolerance Patient tolerated treatment well    Behavior During Therapy [facility] for tasks assessed/performed              Past Medical History:  Diagnosis Date   Acetabulum fracture, left (HCC) 06/30/2022   Alcohol use disorder, severe, in early remission (HCC) 03/30/2018   Allergy    Anxiety    Bipolar disorder, in partial remission, most recent episode hypomanic (HCC) 03/30/2018   [facility]   Bleeding internal hemorrhoids 09/12/2013   Cannabis dependence (HCC) 07/13/2018   Daily use   Cervical disc disease 11/20/2014   Chronic anxiety 07/13/2018   Chronic systolic (congestive) heart failure (HCC)    Complication of anesthesia    PONV   Depression    Dyslipidemia    GERD (gastroesophageal reflux disease)    Hernia, inguinal, bilateral 02/24/2011   High ankle sprain of right lower extremity 01/16/2015   History of hiatal hernia    Hx of hepatitis C 10/05/2017   -treated in 2019 -hepatology recommended no further surveillance needed except for LFTs with labs and see hepatologist if elevated   Hypertension    Lipids abnormal 09/29/2013   Pneumonia    PONV (postoperative nausea and vomiting)    Vitamin D deficiency 07/01/2022   Past Surgical History:  Procedure Laterality Date   ANTERIOR CRUCIATE LIGAMENT REPAIR  2010   BIOPSY  03/13/2019   Procedure: BIOPSY;  Surgeon: Benancio Deeds, MD;  [facility]: [facility];  Service: Gastroenterology;;   COLONOSCOPY N/A 03/13/2019   per Dr. Adela Lank, adenomatous polyps, repeat in 3 yrs   COLONOSCOPY WITH  PROPOFOL N/A 02/11/2023   Procedure: COLONOSCOPY WITH PROPOFOL;  Surgeon: Benancio Deeds, MD;  [facility];  Service: Gastroenterology;  Laterality: N/A;   INGUINAL HERNIA REPAIR  02/23/2012   Procedure: LAPAROSCOPIC BILATERAL INGUINAL HERNIA REPAIR;  Surgeon: Kandis Cocking, MD;  [facility]: [facility];  Service: General;  Laterality: Bilateral;  Laparoscopic Bilateral Inguinal Hernia Repair with mesh   INSERTION OF MESH  02/23/2012   Procedure: INSERTION OF MESH;  Surgeon: Kandis Cocking, MD;  [facility]: [facility];  Service: General;  Laterality: N/A;   NOSE SURGERY  4098,1191   ORIF ACETABULAR FRACTURE Left 06/30/2022   Procedure: OPEN REDUCTION INTERNAL FIXATION (ORIF) ACETABULAR FRACTURE;  Surgeon: Myrene Galas, MD;  [facility];  Service: Orthopedics;  Laterality: Left;   POLYPECTOMY  03/13/2019   Procedure: POLYPECTOMY;  Surgeon: Benancio Deeds, MD;  [facility];  Service: Gastroenterology;;   POLYPECTOMY  02/11/2023   Procedure: POLYPECTOMY;  Surgeon: Benancio Deeds, MD;  [facility];  Service: Gastroenterology;;   Patient Active Problem List   Diagnosis Date Noted   Abnormal LFTs 07/08/2022   Left foot drop 07/01/2022   Vitamin D deficiency 07/01/2022   Acetabulum fracture, left (HCC) 06/30/2022   Alcohol dependence (HCC) 01/05/2022   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   Alcohol-induced acute pancreatitis without infection or necrosis 12/05/2021   Genetic testing 04/21/2019  Benign neoplasm of colon    Loose stools    History of colonic polyps 03/07/2019   Cardiac left ventricular ejection fraction 30-35 percent 03/07/2019   Alcoholic ketoacidosis 11/18/2018   Dyslipidemia 09/16/2018   Fever blister 09/16/2018   Cardiomyopathy, alcoholic (HCC) 09/16/2018   Left bundle branch block (LBBB) 09/16/2018   Cannabis dependence (HCC) 07/13/2018   Chronic anxiety 07/13/2018   Bipolar 1 disorder, depressed, full remission  (HCC) 03/30/2018   Cervical disc disease 11/20/2014   Hypertension 02/24/2011    PCP: Nelwyn Salisbury, MD  REFERRING PROVIDER: Montez Morita PA-C  REFERRING DIAG:  W09.811B (ICD-10-CM) - Closed fracture of acetabulum with dislocation of hip with delayed healing, left  M21.372 (ICD-10-CM) - Left foot drop    THERAPY DIAG:  Pain in left hip  Difficulty in walking, not elsewhere classified  Muscle weakness (generalized)  Rationale for Evaluation and Treatment: Rehabilitation  ONSET DATE: 06/30/22  SUBJECTIVE:   SUBJECTIVE STATEMENT:   Patient reports his balance has been fine here lately. Work going well. Feels mildly under the weather with lo energy levels. Wonders if he is developing a cold.   PERTINENT HISTORY: Pt returning from "mental health" program in [place]. Reports still dealing with L foot drop. Planning to see foot specialist. Denies L hip pain but still having dorsal pain on his foot like before. Reports walking a lot in [place] but not a lot of strength training. Reports still dealing with his dynamic balance relying on stepping strategy to correct balance, still feels his LLE strength is lesser than RLE. Reports having 2 falls while away. Tripped his L toe carrying his laundry basket one time and then tripping going up the stairs. Does state he was not wearing his ankle brace those two times. Also having trouble with balance when applied to unstable surfaces especially combined with cognitive dual tasking.   PAIN:  Are you having pain? No  PRECAUTIONS: None  RED FLAGS: None   WEIGHT BEARING RESTRICTIONS: No  FALLS:  Has patient fallen in last 6 months? Yes. Number of falls 2  LIVING ENVIRONMENT: Lives with: lives with their family Lives in: House/apartment Stairs: No Has following equipment at home: Dan Humphreys - 2 wheeled  OCCUPATION: Press photographer for disability  PLOF: Independent  PATIENT GOALS: Improve balance and strength   NEXT MD VISIT:  N/A  OBJECTIVE:  Note: Objective measures were completed at Evaluation unless otherwise noted.  DIAGNOSTIC FINDINGS: N/A  PATIENT SURVEYS:  FOTO deferred to next session  COGNITION: Overall cognitive status: Within functional limits for tasks assessed     SENSATION: WFL  POSTURE: weight shift right  PALPATION: Mild TTP at LLE 1st MTP joint  LOWER EXTREMITY ROM:   AROM/PROM Right eval Left eval  Hip flexion 120 112  Hip extension 12 4  Hip abduction    Hip adduction    Hip internal rotation 40 40  Hip external rotation 45 42  Knee flexion    Knee extension    Ankle dorsiflexion 10 ~60-70 deg in plantar flexion 0 PROM  Ankle plantarflexion    Ankle inversion    Ankle eversion     (Blank rows = not tested)  LOWER EXTREMITY MMT:  MMT Right eval Left eval  Hip flexion 5 4  Hip extension 4 3+  Hip abduction 4 3+  Hip adduction    Hip internal rotation 5 5  Hip external rotation 5 5  Knee flexion 5 5  Knee extension 5 5  Ankle  dorsiflexion 5 0  Ankle plantarflexion    Ankle inversion    Ankle eversion     (Blank rows = not tested)  LOWER EXTREMITY SPECIAL TESTS:  NA  FUNCTIONAL TESTS:  30 second STS test: 13 reps  FGA: 26/30  Single leg 1 RM leg press R/L: 115 #/85# MiniBesTest: Deferred to next session; 03/16/23: 21/28  GAIT: Distance walked: 10 meters Assistive device utilized: None Level of assistance: Complete Independence Comments: Normalized, reciprocal gait except for L ankle drop foot with no eccentric control with initial contact.                                                                                                                                TREATMENT DATE: 03/29/23  Neuro Re-Ed:   Gait outdoors. 8 minutes with 3# AW's donned.   There.Act:   Single limb leg press: LLE, 75#, 1x5. 80#, 3x5.    Alternating lunges at TM bar: 2x8/LE with light touching with SUE support.        Alternating 8" step ups with contralateral  hip flexion: 2x8/side. Intermittent stepping strategy/touching foot on step due to decreased SLS times primarily standing on LLE. Supervision. VC's for limiting finger support.    PATIENT EDUCATION:  Education details: HEP (reps/sets/frequency), POC, Prognosis Person educated: Patient Education method: Explanation, Demonstration, and Handouts Education comprehension: verbalized understanding and needs further education  HOME EXERCISE PROGRAM: Access Code: [email or web address] URL: [email or web address] Date: 03/16/2023 Prepared by: Ronnie Derby  Exercises - Mini Squat  - 1 x daily - 4-5 x weekly - 3 sets - 8 reps - Standing Hip Abduction with Resistance at Ankles and Counter Support  - 1 x daily - 4-5 x weekly - 3 sets - 8 reps - Single Leg Stance  - 1 x daily - 7 x weekly - 1 sets - 3 reps - 20 hold - Standing Alternating Partial Lunge  - 1 x daily - 3-4 x weekly - 3 sets - 6 reps    Access Code: [email or web address] URL: [email or web address] Date: 01/21/2023 Prepared by: Ronnie Derby  Exercises - Mini Squat  - 1 x daily - 4-5 x weekly - 3 sets - 8 reps - Standing Hip Abduction with Resistance at Ankles and Counter Support  - 1 x daily - 4-5 x weekly - 3 sets - 8 reps  ASSESSMENT:  CLINICAL IMPRESSION:   Continuing PT POC working on dynamic balance and LLE strengthening. Time spent outdoors working on unstable surfaces, asc/desc curbs and hills for Le strength, foot clearance and eccentric control. Overall pt with good understanding of strength and balance exercises. Still is weaker in LLE and demonstrates decreased SLS on LLE as well. These deficits are increasing pt's risk for falls and future injury thus will benefit from skilled PT services to address these impairments and maximize return to PLOF.   OBJECTIVE IMPAIRMENTS: Abnormal gait, decreased mobility, decreased ROM, and decreased strength.   ACTIVITY LIMITATIONS: squatting,  stairs, and locomotion  level  PARTICIPATION LIMITATIONS: community activity and occupation  PERSONAL FACTORS: Age, Fitness, Past/current experiences, and Time since onset of injury/illness/exacerbation are also affecting patient's functional outcome.   REHAB POTENTIAL: Excellent  CLINICAL DECISION MAKING: Stable/uncomplicated  EVALUATION COMPLEXITY: Low   GOALS: Goals reviewed with patient? No  SHORT TERM GOALS: Target date: 04/13/23 Pt will be independent with HEP to improve hip AROM and strength to return to PLOF. Baseline: 01/21/23: provided HEP; 03/16/23: Compliant with HEP about 50% Goal status: PARTIALLY MET  2.  Pt will demonstrate improved L hip extension equal to R hip to demo significant improvement in ability to produce greater power production with transfers and gait.  Baseline: 01/21/23: R/L: 12/4 degrees; 03/16/23: 15/8 degrees Goal status: ON GOING  LONG TERM GOALS: Target date: 05/11/23  Pt will improve FOTO to target score to demonstrate clinically significant improvement in functional mobility.  Baseline: 01/21/23: deferred to next session; 03/16/23: no longer used in this clinic Goal status: DEFERRED  2.  Pt will improve 30 sec STS to age matched norms to demonstrate improve LE power production and endurance for transfers and gait. Baseline: 01/21/23: 13 reps; 03/16/23: 16 reps Goal status: ON GOING  3.  Pt will improve LLE 1RM leg press to RLE to demo return LLE symmetrical strength to non involved limb.  Baseline: 01/21/23: 115#/85# R/L; 03/16/23: LLE 85# Goal status: ON GOING  4.  Pt will improve MiniBES Test by at least 4 points to demonstrate significant improvement in reduced falls risk with dynamic balance activities in community.  Baseline: 01/21/23: Deferred to next session; 03/16/23: 21/28 Goal status: INITIAL   PLAN:  PT FREQUENCY: 1-2x/week  PT DURATION: 8 weeks  PLANNED INTERVENTIONS: 97164- PT Re-evaluation, 97110-Therapeutic exercises, 97530- Therapeutic activity,  97112- Neuromuscular re-education, 97535- Self Care, 16109- Manual therapy, [phone]- Gait training, Patient/Family education, Balance training, Stair training, Dry Needling, Joint mobilization, Cryotherapy, and Moist heat  PLAN FOR NEXT SESSION: LLE strengthening. Dual tasking (cognitive and motor), unstable surfaces and single limb stance.   Delphia Grates. Fairly IV, PT, DPT Physical Therapist- [place]  [facility]  04/15/2023, 3:54 PM

## 2023-04-21 DIAGNOSIS — F331 Major depressive disorder, recurrent, moderate: Secondary | ICD-10-CM | POA: Diagnosis not present

## 2023-04-22 ENCOUNTER — Other Ambulatory Visit: Payer: Self-pay | Admitting: Family Medicine

## 2023-04-22 ENCOUNTER — Ambulatory Visit: Payer: BC Managed Care – PPO

## 2023-04-22 DIAGNOSIS — R262 Difficulty in walking, not elsewhere classified: Secondary | ICD-10-CM | POA: Diagnosis not present

## 2023-04-22 DIAGNOSIS — M6281 Muscle weakness (generalized): Secondary | ICD-10-CM | POA: Diagnosis not present

## 2023-04-22 DIAGNOSIS — M25552 Pain in left hip: Secondary | ICD-10-CM | POA: Diagnosis not present

## 2023-04-22 DIAGNOSIS — I10 Essential (primary) hypertension: Secondary | ICD-10-CM

## 2023-04-22 NOTE — Therapy (Signed)
 OUTPATIENT PHYSICAL THERAPY LOWER EXTREMITY TREATMENT   Patient Name: Thomas Williamson MRN: [id number] DOB:1976/01/26, 48 y.o., male Today's Date: 04/22/2023  END OF SESSION:  PT End of Session - 04/22/23 1105     Visit Number 7    Number of Visits 13    Date for PT Re-Evaluation 05/11/23    PT Start Time 1110    PT Stop Time 1152    PT Time Calculation (min) 42 min    Activity Tolerance Patient tolerated treatment well    Behavior During Therapy [facility] for tasks assessed/performed              Past Medical History:  Diagnosis Date   Acetabulum fracture, left (HCC) 06/30/2022   Alcohol use disorder, severe, in early remission (HCC) 03/30/2018   Allergy    Anxiety    Bipolar disorder, in partial remission, most recent episode hypomanic (HCC) 03/30/2018   [facility]   Bleeding internal hemorrhoids 09/12/2013   Cannabis dependence (HCC) 07/13/2018   Daily use   Cervical disc disease 11/20/2014   Chronic anxiety 07/13/2018   Chronic systolic (congestive) heart failure (HCC)    Complication of anesthesia    PONV   Depression    Dyslipidemia    GERD (gastroesophageal reflux disease)    Hernia, inguinal, bilateral 02/24/2011   High ankle sprain of right lower extremity 01/16/2015   History of hiatal hernia    Hx of hepatitis C 10/05/2017   -treated in 2019 -hepatology recommended no further surveillance needed except for LFTs with labs and see hepatologist if elevated   Hypertension    Lipids abnormal 09/29/2013   Pneumonia    PONV (postoperative nausea and vomiting)    Vitamin D deficiency 07/01/2022   Past Surgical History:  Procedure Laterality Date   ANTERIOR CRUCIATE LIGAMENT REPAIR  2010   BIOPSY  03/13/2019   Procedure: BIOPSY;  Surgeon: Benancio Deeds, MD;  Location: [facility];  Service: Gastroenterology;;   COLONOSCOPY N/A 03/13/2019   per Dr. Adela Lank, adenomatous polyps, repeat in 3 yrs   COLONOSCOPY WITH  PROPOFOL N/A 02/11/2023   Procedure: COLONOSCOPY WITH PROPOFOL;  Surgeon: Benancio Deeds, MD;  Location: [facility];  Service: Gastroenterology;  Laterality: N/A;   INGUINAL HERNIA REPAIR  02/23/2012   Procedure: LAPAROSCOPIC BILATERAL INGUINAL HERNIA REPAIR;  Surgeon: Kandis Cocking, MD;  Location: [facility];  Service: General;  Laterality: Bilateral;  Laparoscopic Bilateral Inguinal Hernia Repair with mesh   INSERTION OF MESH  02/23/2012   Procedure: INSERTION OF MESH;  Surgeon: Kandis Cocking, MD;  Location: [facility];  Service: General;  Laterality: N/A;   NOSE SURGERY  1191,4782   ORIF ACETABULAR FRACTURE Left 06/30/2022   Procedure: OPEN REDUCTION INTERNAL FIXATION (ORIF) ACETABULAR FRACTURE;  Surgeon: Myrene Galas, MD;  Location: [facility];  Service: Orthopedics;  Laterality: Left;   POLYPECTOMY  03/13/2019   Procedure: POLYPECTOMY;  Surgeon: Benancio Deeds, MD;  Location: [facility];  Service: Gastroenterology;;   POLYPECTOMY  02/11/2023   Procedure: POLYPECTOMY;  Surgeon: Benancio Deeds, MD;  Location: [facility];  Service: Gastroenterology;;   Patient Active Problem List   Diagnosis Date Noted   Abnormal LFTs 07/08/2022   Left foot drop 07/01/2022   Vitamin D deficiency 07/01/2022   Acetabulum fracture, left (HCC) 06/30/2022   Alcohol dependence (HCC) 01/05/2022   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   Alcohol-induced acute pancreatitis without infection or necrosis 12/05/2021   Genetic testing 04/21/2019  Benign neoplasm of colon    Loose stools    History of colonic polyps 03/07/2019   Cardiac left ventricular ejection fraction 30-35 percent 03/07/2019   Alcoholic ketoacidosis 11/18/2018   Dyslipidemia 09/16/2018   Fever blister 09/16/2018   Cardiomyopathy, alcoholic (HCC) 09/16/2018   Left bundle branch block (LBBB) 09/16/2018   Cannabis dependence (HCC) 07/13/2018   Chronic anxiety 07/13/2018   Bipolar 1 disorder, depressed, full remission  (HCC) 03/30/2018   Cervical disc disease 11/20/2014   Hypertension 02/24/2011    PCP: Nelwyn Salisbury, MD  REFERRING PROVIDER: Montez Morita PA-C  REFERRING DIAG:  N82.956O (ICD-10-CM) - Closed fracture of acetabulum with dislocation of hip with delayed healing, left  M21.372 (ICD-10-CM) - Left foot drop    THERAPY DIAG:  Pain in left hip  Difficulty in walking, not elsewhere classified  Muscle weakness (generalized)  Rationale for Evaluation and Treatment: Rehabilitation  ONSET DATE: 06/30/22  SUBJECTIVE:   SUBJECTIVE STATEMENT:   Patient reports he feels better than last week. No falls. Work continues to go well.   PERTINENT HISTORY: Pt returning from "mental health" program in [place]. Reports still dealing with L foot drop. Planning to see foot specialist. Denies L hip pain but still having dorsal pain on his foot like before. Reports walking a lot in [place] but not a lot of strength training. Reports still dealing with his dynamic balance relying on stepping strategy to correct balance, still feels his LLE strength is lesser than RLE. Reports having 2 falls while away. Tripped his L toe carrying his laundry basket one time and then tripping going up the stairs. Does state he was not wearing his ankle brace those two times. Also having trouble with balance when applied to unstable surfaces especially combined with cognitive dual tasking.   PAIN:  Are you having pain? No  PRECAUTIONS: None  RED FLAGS: None   WEIGHT BEARING RESTRICTIONS: No  FALLS:  Has patient fallen in last 6 months? Yes. Number of falls 2  LIVING ENVIRONMENT: Lives with: lives with their family Lives in: House/apartment Stairs: No Has following equipment at home: Dan Humphreys - 2 wheeled  OCCUPATION: Press photographer for disability  PLOF: Independent  PATIENT GOALS: Improve balance and strength   NEXT MD VISIT: N/A  OBJECTIVE:  Note: Objective measures were completed at Evaluation unless  otherwise noted.  DIAGNOSTIC FINDINGS: N/A  PATIENT SURVEYS:  FOTO deferred to next session  COGNITION: Overall cognitive status: Within functional limits for tasks assessed     SENSATION: WFL  POSTURE: weight shift right  PALPATION: Mild TTP at LLE 1st MTP joint  LOWER EXTREMITY ROM:   AROM/PROM Right eval Left eval  Hip flexion 120 112  Hip extension 12 4  Hip abduction    Hip adduction    Hip internal rotation 40 40  Hip external rotation 45 42  Knee flexion    Knee extension    Ankle dorsiflexion 10 ~60-70 deg in plantar flexion 0 PROM  Ankle plantarflexion    Ankle inversion    Ankle eversion     (Blank rows = not tested)  LOWER EXTREMITY MMT:  MMT Right eval Left eval  Hip flexion 5 4  Hip extension 4 3+  Hip abduction 4 3+  Hip adduction    Hip internal rotation 5 5  Hip external rotation 5 5  Knee flexion 5 5  Knee extension 5 5  Ankle dorsiflexion 5 0  Ankle plantarflexion    Ankle inversion  Ankle eversion     (Blank rows = not tested)  LOWER EXTREMITY SPECIAL TESTS:  NA  FUNCTIONAL TESTS:  30 second STS test: 13 reps  FGA: 26/30  Single leg 1 RM leg press R/L: 115 #/85# MiniBesTest: Deferred to next session; 03/16/23: 21/28  GAIT: Distance walked: 10 meters Assistive device utilized: None Level of assistance: Complete Independence Comments: Normalized, reciprocal gait except for L ankle drop foot with no eccentric control with initial contact.                                                                                                                                TREATMENT DATE: 04/22/2023  Neuro Re-Ed:   Gait outdoors. 13 minutes with 4# AW's donned. X4 (x2/direction facing street/buildings) for medial/lateral ankle stability and hip strength. X1 lap side stepping to the R in grass. PRN asc/desc curbs and gait in mulch for unstable surface navigation.     There.Act:   Standing Heel Raises on Bosu, BUE support 2x10.  Close Supervision Support.   Alternating lunges on Bosu: 2x8/LE, CGA, verbal cues for foot placement on Bosu.        Alternating 8" step ups with contralateral hip flexion: 2x10/side. Donned 4# AW. Improved ankle stability on LLE. Intermittent stepping strategy while on SLS with LLE. Close supervision provided.     Elevated single limb STS from mat table (21.5"): 2x8/LE with seated rest b/t sets. Hand Held Support. Verbal cues for pace.    PATIENT EDUCATION:  Education details: HEP (reps/sets/frequency), POC, Prognosis Person educated: Patient Education method: Explanation, Demonstration, and Handouts Education comprehension: verbalized understanding and needs further education  HOME EXERCISE PROGRAM: Access Code: [email or web address] URL: [email or web address] Date: 03/16/2023 Prepared by: Ronnie Derby  Exercises - Mini Squat  - 1 x daily - 4-5 x weekly - 3 sets - 8 reps - Standing Hip Abduction with Resistance at Ankles and Counter Support  - 1 x daily - 4-5 x weekly - 3 sets - 8 reps - Single Leg Stance  - 1 x daily - 7 x weekly - 1 sets - 3 reps - 20 hold - Standing Alternating Partial Lunge  - 1 x daily - 3-4 x weekly - 3 sets - 6 reps    Access Code: [email or web address] URL: [email or web address] Date: 01/21/2023 Prepared by: Ronnie Derby  Exercises - Mini Squat  - 1 x daily - 4-5 x weekly - 3 sets - 8 reps - Standing Hip Abduction with Resistance at Ankles and Counter Support  - 1 x daily - 4-5 x weekly - 3 sets - 8 reps  ASSESSMENT:  CLINICAL IMPRESSION:   Continuing PT POC working on dynamic balance and LLE strengthening. Demonstrated improved ankle strategies with outdoor gait and navigating unstable surfaces, inclines and hills. He tolerated additional exercises on unlevel surfaces with bosu in order to improve ankle stability. Still continues to demonstrate weakness in LLE during SLS and unlevel surfaces. These deficits  are increasing pt's risk for falls  and future injury thus will benefit from skilled PT services to address these impairments and maximize return to PLOF.    OBJECTIVE IMPAIRMENTS: Abnormal gait, decreased mobility, decreased ROM, and decreased strength.   ACTIVITY LIMITATIONS: squatting, stairs, and locomotion level  PARTICIPATION LIMITATIONS: community activity and occupation  PERSONAL FACTORS: Age, Fitness, Past/current experiences, and Time since onset of injury/illness/exacerbation are also affecting patient's functional outcome.   REHAB POTENTIAL: Excellent  CLINICAL DECISION MAKING: Stable/uncomplicated  EVALUATION COMPLEXITY: Low   GOALS: Goals reviewed with patient? No  SHORT TERM GOALS: Target date: 04/13/23 Pt will be independent with HEP to improve hip AROM and strength to return to PLOF. Baseline: 01/21/23: provided HEP; 03/16/23: Compliant with HEP about 50% Goal status: PARTIALLY MET  2.  Pt will demonstrate improved L hip extension equal to R hip to demo significant improvement in ability to produce greater power production with transfers and gait.  Baseline: 01/21/23: R/L: 12/4 degrees; 03/16/23: 15/8 degrees Goal status: ON GOING  LONG TERM GOALS: Target date: 05/11/23  Pt will improve FOTO to target score to demonstrate clinically significant improvement in functional mobility.  Baseline: 01/21/23: deferred to next session; 03/16/23: no longer used in this clinic Goal status: DEFERRED  2.  Pt will improve 30 sec STS to age matched norms to demonstrate improve LE power production and endurance for transfers and gait. Baseline: 01/21/23: 13 reps; 03/16/23: 16 reps Goal status: ON GOING  3.  Pt will improve LLE 1RM leg press to RLE to demo return LLE symmetrical strength to non involved limb.  Baseline: 01/21/23: 115#/85# R/L; 03/16/23: LLE 85# Goal status: ON GOING  4.  Pt will improve MiniBES Test by at least 4 points to demonstrate significant improvement in reduced falls risk with dynamic  balance activities in community.  Baseline: 01/21/23: Deferred to next session; 03/16/23: 21/28 Goal status: INITIAL   PLAN:  PT FREQUENCY: 1-2x/week  PT DURATION: 8 weeks  PLANNED INTERVENTIONS: [id number]- PT Re-evaluation, 97110-Therapeutic exercises, 97530- Therapeutic activity, 97112- Neuromuscular re-education, 97535- Self Care, [id number]- Manual therapy, [phone]- Gait training, Patient/Family education, Balance training, Stair training, Dry Needling, Joint mobilization, Cryotherapy, and Moist heat  PLAN FOR NEXT SESSION: LLE strengthening. Dual tasking (cognitive and motor), unstable surfaces and single limb stance.   Delphia Grates. Fairly IV, PT, DPT Physical Therapist-   [facility]  04/22/2023, 12:13 PM

## 2023-04-27 ENCOUNTER — Other Ambulatory Visit: Payer: Self-pay | Admitting: Family Medicine

## 2023-04-28 ENCOUNTER — Ambulatory Visit

## 2023-04-28 ENCOUNTER — Other Ambulatory Visit: Payer: Self-pay | Admitting: Family Medicine

## 2023-04-28 DIAGNOSIS — R262 Difficulty in walking, not elsewhere classified: Secondary | ICD-10-CM | POA: Diagnosis not present

## 2023-04-28 DIAGNOSIS — M25552 Pain in left hip: Secondary | ICD-10-CM | POA: Diagnosis not present

## 2023-04-28 DIAGNOSIS — J358 Other chronic diseases of tonsils and adenoids: Secondary | ICD-10-CM | POA: Diagnosis not present

## 2023-04-28 DIAGNOSIS — M6281 Muscle weakness (generalized): Secondary | ICD-10-CM | POA: Diagnosis not present

## 2023-04-28 NOTE — Therapy (Unsigned)
 OUTPATIENT PHYSICAL THERAPY LOWER EXTREMITY TREATMENT   Patient Name: Thomas Williamson MRN: [id number] DOB:Jun 22, 1975, 48 y.o., male Today's Date: 04/29/2023  END OF SESSION:  PT End of Session - 04/28/23 1638     Visit Number 8    Number of Visits 13    Date for PT Re-Evaluation 05/11/23    PT Start Time 1643    PT Stop Time 1724    PT Time Calculation (min) 41 min    Activity Tolerance Patient tolerated treatment well    Behavior During Therapy [facility] for tasks assessed/performed              Past Medical History:  Diagnosis Date   Acetabulum fracture, left (HCC) 06/30/2022   Alcohol use disorder, severe, in early remission (HCC) 03/30/2018   Allergy    Anxiety    Bipolar disorder, in partial remission, most recent episode hypomanic (HCC) 03/30/2018   [facility]   Bleeding internal hemorrhoids 09/12/2013   Cannabis dependence (HCC) 07/13/2018   Daily use   Cervical disc disease 11/20/2014   Chronic anxiety 07/13/2018   Chronic systolic (congestive) heart failure (HCC)    Complication of anesthesia    PONV   Depression    Dyslipidemia    GERD (gastroesophageal reflux disease)    Hernia, inguinal, bilateral 02/24/2011   High ankle sprain of right lower extremity 01/16/2015   History of hiatal hernia    Hx of hepatitis C 10/05/2017   -treated in 2019 -hepatology recommended no further surveillance needed except for LFTs with labs and see hepatologist if elevated   Hypertension    Lipids abnormal 09/29/2013   Pneumonia    PONV (postoperative nausea and vomiting)    Vitamin D deficiency 07/01/2022   Past Surgical History:  Procedure Laterality Date   ANTERIOR CRUCIATE LIGAMENT REPAIR  2010   BIOPSY  03/13/2019   Procedure: BIOPSY;  Surgeon: Benancio Deeds, MD;  Location: [facility];  Service: Gastroenterology;;   COLONOSCOPY N/A 03/13/2019   per Dr. Adela Lank, adenomatous polyps, repeat in 3 yrs   COLONOSCOPY WITH  PROPOFOL N/A 02/11/2023   Procedure: COLONOSCOPY WITH PROPOFOL;  Surgeon: Benancio Deeds, MD;  Location: [facility];  Service: Gastroenterology;  Laterality: N/A;   INGUINAL HERNIA REPAIR  02/23/2012   Procedure: LAPAROSCOPIC BILATERAL INGUINAL HERNIA REPAIR;  Surgeon: Kandis Cocking, MD;  Location: [facility];  Service: General;  Laterality: Bilateral;  Laparoscopic Bilateral Inguinal Hernia Repair with mesh   INSERTION OF MESH  02/23/2012   Procedure: INSERTION OF MESH;  Surgeon: Kandis Cocking, MD;  Location: [facility];  Service: General;  Laterality: N/A;   NOSE SURGERY  1324,4010   ORIF ACETABULAR FRACTURE Left 06/30/2022   Procedure: OPEN REDUCTION INTERNAL FIXATION (ORIF) ACETABULAR FRACTURE;  Surgeon: Myrene Galas, MD;  Location: [facility];  Service: Orthopedics;  Laterality: Left;   POLYPECTOMY  03/13/2019   Procedure: POLYPECTOMY;  Surgeon: Benancio Deeds, MD;  Location: [facility];  Service: Gastroenterology;;   POLYPECTOMY  02/11/2023   Procedure: POLYPECTOMY;  Surgeon: Benancio Deeds, MD;  Location: [facility];  Service: Gastroenterology;;   Patient Active Problem List   Diagnosis Date Noted   Abnormal LFTs 07/08/2022   Left foot drop 07/01/2022   Vitamin D deficiency 07/01/2022   Acetabulum fracture, left (HCC) 06/30/2022   Alcohol dependence (HCC) 01/05/2022   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   Alcohol-induced acute pancreatitis without infection or necrosis 12/05/2021   Genetic testing 04/21/2019  Benign neoplasm of colon    Loose stools    History of colonic polyps 03/07/2019   Cardiac left ventricular ejection fraction 30-35 percent 03/07/2019   Alcoholic ketoacidosis 11/18/2018   Dyslipidemia 09/16/2018   Fever blister 09/16/2018   Cardiomyopathy, alcoholic (HCC) 09/16/2018   Left bundle branch block (LBBB) 09/16/2018   Cannabis dependence (HCC) 07/13/2018   Chronic anxiety 07/13/2018   Bipolar 1 disorder, depressed, full remission  (HCC) 03/30/2018   Cervical disc disease 11/20/2014   Hypertension 02/24/2011    PCP: Nelwyn Salisbury, MD  REFERRING PROVIDER: Montez Morita PA-C  REFERRING DIAG:  W09.811B (ICD-10-CM) - Closed fracture of acetabulum with dislocation of hip with delayed healing, left  M21.372 (ICD-10-CM) - Left foot drop    THERAPY DIAG:  Difficulty in walking, not elsewhere classified  Muscle weakness (generalized)  Rationale for Evaluation and Treatment: Rehabilitation  ONSET DATE: 06/30/22  SUBJECTIVE:   SUBJECTIVE STATEMENT:   Patient reported last week went well; no falls. One bout of near fall but able to correct. Donned AFO prior to session.   PERTINENT HISTORY: Pt returning from "mental health" program in [place]. Reports still dealing with L foot drop. Planning to see foot specialist. Denies L hip pain but still having dorsal pain on his foot like before. Reports walking a lot in [place] but not a lot of strength training. Reports still dealing with his dynamic balance relying on stepping strategy to correct balance, still feels his LLE strength is lesser than RLE. Reports having 2 falls while away. Tripped his L toe carrying his laundry basket one time and then tripping going up the stairs. Does state he was not wearing his ankle brace those two times. Also having trouble with balance when applied to unstable surfaces especially combined with cognitive dual tasking.   PAIN:  Are you having pain? No  PRECAUTIONS: None  RED FLAGS: None   WEIGHT BEARING RESTRICTIONS: No  FALLS:  Has patient fallen in last 6 months? Yes. Number of falls 2  LIVING ENVIRONMENT: Lives with: lives with their family Lives in: House/apartment Stairs: No Has following equipment at home: Dan Humphreys - 2 wheeled  OCCUPATION: Press photographer for disability  PLOF: Independent  PATIENT GOALS: Improve balance and strength   NEXT MD VISIT: N/A  OBJECTIVE:  Note: Objective measures were completed at  Evaluation unless otherwise noted.  DIAGNOSTIC FINDINGS: N/A  PATIENT SURVEYS:  FOTO deferred to next session  COGNITION: Overall cognitive status: Within functional limits for tasks assessed     SENSATION: WFL  POSTURE: weight shift right  PALPATION: Mild TTP at LLE 1st MTP joint  LOWER EXTREMITY ROM:   AROM/PROM Right eval Left eval  Hip flexion 120 112  Hip extension 12 4  Hip abduction    Hip adduction    Hip internal rotation 40 40  Hip external rotation 45 42  Knee flexion    Knee extension    Ankle dorsiflexion 10 ~60-70 deg in plantar flexion 0 PROM  Ankle plantarflexion    Ankle inversion    Ankle eversion     (Blank rows = not tested)  LOWER EXTREMITY MMT:  MMT Right eval Left eval  Hip flexion 5 4  Hip extension 4 3+  Hip abduction 4 3+  Hip adduction    Hip internal rotation 5 5  Hip external rotation 5 5  Knee flexion 5 5  Knee extension 5 5  Ankle dorsiflexion 5 0  Ankle plantarflexion    Ankle inversion  Ankle eversion     (Blank rows = not tested)  LOWER EXTREMITY SPECIAL TESTS:  NA  FUNCTIONAL TESTS:  30 second STS test: 13 reps  FGA: 26/30  Single leg 1 RM leg press R/L: 115 #/85# MiniBesTest: Deferred to next session; 03/16/23: 21/28  GAIT: Distance walked: 10 meters Assistive device utilized: None Level of assistance: Complete Independence Comments: Normalized, reciprocal gait except for L ankle drop foot with no eccentric control with initial contact.                                                                                                                                TREATMENT DATE: 04/28/2023  Neuro Re-Ed:   Ambulance person. 10 minutes with 4# AW's donned. Ankle weights donned in order to improve joint proprioception and step height. x2 forward/retro direction x2 medial/lateral direction (x2 facing buildings)  Crossover stepping to the R/L direction x 4 laps in grass. PRN asc/desc curbs and gait in  mulch for unstable surface navigation.    There.Act:   Standing on Airex with x6 Blazepods (3 on R/L side of airex) BLE deactivation 2 x 1 min, Random protocol, 2nd set Focus protocol, CGA. The [facility] Focus setting was selected to refine precision and concentration, isolating specific muscle groups or movements to enhance overall coordination and targeted muscle engagement.   Alternating lunges on Bosu: 2x8/LE, CGA, verbal cues for foot placement on Bosu. Seated rest break in between sets.   Lateral Stepping on Balance Beam, with cone tapping with cone tapping (x3 cones, L/R and middle of the beam): 10 cone tap, Close supervision with finger support on cubicle.   Crossover Stepping on Balance Beam, with cone tapping (x3 cones, L/R and middle of the beam) 10 cone taps, Close supervision with finger support on cubicle.   Sit to Stand with 20#KB 2x10 in order to increase LE strength. Seated rest break in between sets.   Discussed progressions of HEP for single leg stance.   PATIENT EDUCATION:  Education details: HEP (reps/sets/frequency), POC, Prognosis Person educated: Patient Education method: Explanation, Demonstration, and Handouts Education comprehension: verbalized understanding and needs further education  HOME EXERCISE PROGRAM: Access Code: [email or web address] URL: [email or web address] Date: 03/16/2023 Prepared by: Ronnie Derby  Exercises - Mini Squat  - 1 x daily - 4-5 x weekly - 3 sets - 8 reps - Standing Hip Abduction with Resistance at Ankles and Counter Support  - 1 x daily - 4-5 x weekly - 3 sets - 8 reps - Single Leg Stance  - 1 x daily - 7 x weekly - 1 sets - 3 reps - 20 hold - Standing Alternating Partial Lunge  - 1 x daily - 3-4 x weekly - 3 sets - 6 reps    Access Code: [email or web address] URL: [email or web address] Date: 01/21/2023 Prepared by: Ronnie Derby  Exercises - Mini Squat  - 1 x daily - 4-5 x weekly - 3 sets - 8  reps - Standing Hip  Abduction with Resistance at Ankles and Counter Support  - 1 x daily - 4-5 x weekly - 3 sets - 8 reps  ASSESSMENT:  CLINICAL IMPRESSION:   Patient reported to PT with a main focus on improving joint proprioception, dynamic balance and single leg stability. Patient tolerated increase in stance time with single leg stance activities. Spent time outdoors in order to improve dynamic balance with uneven surfaces and terrain. Close supervision and intermittent CGA provided from PT throughout session in order to maintain balance with single leg activities. Pt continues to demonstrate improvements in dynamic balance. Based on today's session patient would benefit from additional PT services in order to maximize safety and facilitate return to PLOF.      OBJECTIVE IMPAIRMENTS: Abnormal gait, decreased mobility, decreased ROM, and decreased strength.   ACTIVITY LIMITATIONS: squatting, stairs, and locomotion level  PARTICIPATION LIMITATIONS: community activity and occupation  PERSONAL FACTORS: Age, Fitness, Past/current experiences, and Time since onset of injury/illness/exacerbation are also affecting patient's functional outcome.   REHAB POTENTIAL: Excellent  CLINICAL DECISION MAKING: Stable/uncomplicated  EVALUATION COMPLEXITY: Low   GOALS: Goals reviewed with patient? No  SHORT TERM GOALS: Target date: 04/13/23 Pt will be independent with HEP to improve hip AROM and strength to return to PLOF. Baseline: 01/21/23: provided HEP; 03/16/23: Compliant with HEP about 50% Goal status: PARTIALLY MET  2.  Pt will demonstrate improved L hip extension equal to R hip to demo significant improvement in ability to produce greater power production with transfers and gait.  Baseline: 01/21/23: R/L: 12/4 degrees; 03/16/23: 15/8 degrees Goal status: ON GOING  LONG TERM GOALS: Target date: 05/11/23  Pt will improve FOTO to target score to demonstrate clinically significant improvement in functional mobility.   Baseline: 01/21/23: deferred to next session; 03/16/23: no longer used in this clinic Goal status: DEFERRED  2.  Pt will improve 30 sec STS to age matched norms to demonstrate improve LE power production and endurance for transfers and gait. Baseline: 01/21/23: 13 reps; 03/16/23: 16 reps Goal status: ON GOING  3.  Pt will improve LLE 1RM leg press to RLE to demo return LLE symmetrical strength to non involved limb.  Baseline: 01/21/23: 115#/85# R/L; 03/16/23: LLE 85# Goal status: ON GOING  4.  Pt will improve MiniBES Test by at least 4 points to demonstrate significant improvement in reduced falls risk with dynamic balance activities in community.  Baseline: 01/21/23: Deferred to next session; 03/16/23: 21/28 Goal status: INITIAL   PLAN:  PT FREQUENCY: 1-2x/week  PT DURATION: 8 weeks  PLANNED INTERVENTIONS: 97164- PT Re-evaluation, 97110-Therapeutic exercises, 97530- Therapeutic activity, 97112- Neuromuscular re-education, 97535- Self Care, 16109- Manual therapy, [phone]- Gait training, Patient/Family education, Balance training, Stair training, Dry Needling, Joint mobilization, Cryotherapy, and Moist heat  PLAN FOR NEXT SESSION: LLE strengthening. Dual tasking (cognitive and motor), unstable surfaces and single limb stance.   Christene Lye PT, DPT Physical Therapist- [facility]  04/29/2023, 8:59 AM

## 2023-05-04 ENCOUNTER — Ambulatory Visit

## 2023-05-05 DIAGNOSIS — F331 Major depressive disorder, recurrent, moderate: Secondary | ICD-10-CM | POA: Diagnosis not present

## 2023-05-06 ENCOUNTER — Ambulatory Visit: Attending: Orthopedic Surgery

## 2023-05-06 DIAGNOSIS — M6281 Muscle weakness (generalized): Secondary | ICD-10-CM | POA: Diagnosis not present

## 2023-05-06 DIAGNOSIS — M25552 Pain in left hip: Secondary | ICD-10-CM | POA: Insufficient documentation

## 2023-05-06 DIAGNOSIS — R262 Difficulty in walking, not elsewhere classified: Secondary | ICD-10-CM | POA: Insufficient documentation

## 2023-05-06 NOTE — Therapy (Signed)
 OUTPATIENT PHYSICAL THERAPY LOWER EXTREMITY TREATMENT/RECERTIFICATION   Patient Name: Thomas Williamson MRN: [id number] DOB:05-03-1975, 48 y.o., male Today's Date: 05/06/2023  END OF SESSION:  PT End of Session - 05/06/23 1518     Visit Number 9    Number of Visits 19    Date for PT Re-Evaluation 06/17/23    PT Start Time 1517    PT Stop Time 1555    PT Time Calculation (min) 38 min    Activity Tolerance Patient tolerated treatment well    Behavior During Therapy [facility] for tasks assessed/performed              Past Medical History:  Diagnosis Date   Acetabulum fracture, left (HCC) 06/30/2022   Alcohol use disorder, severe, in early remission (HCC) 03/30/2018   Allergy    Anxiety    Bipolar disorder, in partial remission, most recent episode hypomanic (HCC) 03/30/2018   [facility]   Bleeding internal hemorrhoids 09/12/2013   Cannabis dependence (HCC) 07/13/2018   Daily use   Cervical disc disease 11/20/2014   Chronic anxiety 07/13/2018   Chronic systolic (congestive) heart failure (HCC)    Complication of anesthesia    PONV   Depression    Dyslipidemia    GERD (gastroesophageal reflux disease)    Hernia, inguinal, bilateral 02/24/2011   High ankle sprain of right lower extremity 01/16/2015   History of hiatal hernia    Hx of hepatitis C 10/05/2017   -treated in 2019 -hepatology recommended no further surveillance needed except for LFTs with labs and see hepatologist if elevated   Hypertension    Lipids abnormal 09/29/2013   Pneumonia    PONV (postoperative nausea and vomiting)    Vitamin D deficiency 07/01/2022   Past Surgical History:  Procedure Laterality Date   ANTERIOR CRUCIATE LIGAMENT REPAIR  2010   BIOPSY  03/13/2019   Procedure: BIOPSY;  Surgeon: Benancio Deeds, MD;  Location: [facility];  Service: Gastroenterology;;   COLONOSCOPY N/A 03/13/2019   per Dr. Adela Lank, adenomatous polyps, repeat in 3 yrs    COLONOSCOPY WITH PROPOFOL N/A 02/11/2023   Procedure: COLONOSCOPY WITH PROPOFOL;  Surgeon: Benancio Deeds, MD;  Location: [facility];  Service: Gastroenterology;  Laterality: N/A;   INGUINAL HERNIA REPAIR  02/23/2012   Procedure: LAPAROSCOPIC BILATERAL INGUINAL HERNIA REPAIR;  Surgeon: Kandis Cocking, MD;  Location: [facility];  Service: General;  Laterality: Bilateral;  Laparoscopic Bilateral Inguinal Hernia Repair with mesh   INSERTION OF MESH  02/23/2012   Procedure: INSERTION OF MESH;  Surgeon: Kandis Cocking, MD;  Location: [facility];  Service: General;  Laterality: N/A;   NOSE SURGERY  2956,2130   ORIF ACETABULAR FRACTURE Left 06/30/2022   Procedure: OPEN REDUCTION INTERNAL FIXATION (ORIF) ACETABULAR FRACTURE;  Surgeon: Myrene Galas, MD;  Location: [facility];  Service: Orthopedics;  Laterality: Left;   POLYPECTOMY  03/13/2019   Procedure: POLYPECTOMY;  Surgeon: Benancio Deeds, MD;  Location: [facility];  Service: Gastroenterology;;   POLYPECTOMY  02/11/2023   Procedure: POLYPECTOMY;  Surgeon: Benancio Deeds, MD;  Location: [facility];  Service: Gastroenterology;;   Patient Active Problem List   Diagnosis Date Noted   Abnormal LFTs 07/08/2022   Left foot drop 07/01/2022   Vitamin D deficiency 07/01/2022   Acetabulum fracture, left (HCC) 06/30/2022   Alcohol dependence (HCC) 01/05/2022   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   Alcohol-induced acute pancreatitis without infection or necrosis 12/05/2021   Genetic testing 04/21/2019  Benign neoplasm of colon    Loose stools    History of colonic polyps 03/07/2019   Cardiac left ventricular ejection fraction 30-35 percent 03/07/2019   Alcoholic ketoacidosis 11/18/2018   Dyslipidemia 09/16/2018   Fever blister 09/16/2018   Cardiomyopathy, alcoholic (HCC) 09/16/2018   Left bundle branch block (LBBB) 09/16/2018   Cannabis dependence (HCC) 07/13/2018   Chronic anxiety 07/13/2018   Bipolar 1 disorder,  depressed, full remission (HCC) 03/30/2018   Cervical disc disease 11/20/2014   Hypertension 02/24/2011    PCP: Nelwyn Salisbury, MD  REFERRING PROVIDER: Montez Morita PA-C  REFERRING DIAG:  N56.213Y (ICD-10-CM) - Closed fracture of acetabulum with dislocation of hip with delayed healing, left  M21.372 (ICD-10-CM) - Left foot drop    THERAPY DIAG:  No diagnosis found.  Rationale for Evaluation and Treatment: Rehabilitation  ONSET DATE: 06/30/22  SUBJECTIVE:   SUBJECTIVE STATEMENT:   Patient reports that things have been going well. No recent falls in the last week. Donned ankle brace prior to start of session. No additional questions or concerns.   PERTINENT HISTORY: Pt returning from "mental health" program in [place]. Reports still dealing with L foot drop. Planning to see foot specialist. Denies L hip pain but still having dorsal pain on his foot like before. Reports walking a lot in [place] but not a lot of strength training. Reports still dealing with his dynamic balance relying on stepping strategy to correct balance, still feels his LLE strength is lesser than RLE. Reports having 2 falls while away. Tripped his L toe carrying his laundry basket one time and then tripping going up the stairs. Does state he was not wearing his ankle brace those two times. Also having trouble with balance when applied to unstable surfaces especially combined with cognitive dual tasking.   PAIN:  Are you having pain? No  PRECAUTIONS: None  RED FLAGS: None   WEIGHT BEARING RESTRICTIONS: No  FALLS:  Has patient fallen in last 6 months? Yes. Number of falls 2  LIVING ENVIRONMENT: Lives with: lives with their family Lives in: House/apartment Stairs: No Has following equipment at home: Dan Humphreys - 2 wheeled  OCCUPATION: Press photographer for disability  PLOF: Independent  PATIENT GOALS: Improve balance and strength   NEXT MD VISIT: N/A  OBJECTIVE:  Note: Objective measures were  completed at Evaluation unless otherwise noted.  DIAGNOSTIC FINDINGS: N/A  PATIENT SURVEYS:  FOTO deferred to next session  COGNITION: Overall cognitive status: Within functional limits for tasks assessed     SENSATION: WFL  POSTURE: weight shift right  PALPATION: Mild TTP at LLE 1st MTP joint  LOWER EXTREMITY ROM:   AROM/PROM Right eval Left eval  Hip flexion 120 112  Hip extension 12 4  Hip abduction    Hip adduction    Hip internal rotation 40 40  Hip external rotation 45 42  Knee flexion    Knee extension    Ankle dorsiflexion 10 ~60-70 deg in plantar flexion 0 PROM  Ankle plantarflexion    Ankle inversion    Ankle eversion     (Blank rows = not tested)  LOWER EXTREMITY MMT:  MMT Right eval Left eval  Hip flexion 5 4  Hip extension 4 3+  Hip abduction 4 3+  Hip adduction    Hip internal rotation 5 5  Hip external rotation 5 5  Knee flexion 5 5  Knee extension 5 5  Ankle dorsiflexion 5 0  Ankle plantarflexion    Ankle inversion  Ankle eversion     (Blank rows = not tested)  LOWER EXTREMITY SPECIAL TESTS:  NA  FUNCTIONAL TESTS:  30 second STS test: 13 reps  FGA: 26/30  Single leg 1 RM leg press R/L: 115 #/85# MiniBesTest: Deferred to next session; 03/16/23: 21/28  GAIT: Distance walked: 10 meters Assistive device utilized: None Level of assistance: Complete Independence Comments: Normalized, reciprocal gait except for L ankle drop foot with no eccentric control with initial contact.                                                                                                                                TREATMENT DATE: 05/06/2023  Physical Performance Measures:   30 second STS test: 21 Reps    Single leg 1 RM leg press R/L: 115 #/105#  MiniBesTest: 24/28, Difficulty with incline surface, single leg stance and lateral stepping reactions   Neuromuscular Re-Education:   Time spent discussing results regarding progress on  minibest and tasks with deficits.   Single Leg Sit to Stand from 20" Mat Table behind SBA :  R: 2 x 3 (Significant hip and ankle righting reaction with moderate LOB)  L: 2 x 3 (Significant hip and ankle righting reaction noted) Seated rest break in between sets due to fatigue.   Static Stance on Slant Board:   Decline Direction: 1 x 30s, SBA   Incline Direction: 1 x 10s, notable forward trunk lean with hip right reaction, CGA required in order to maintain balance   PATIENT EDUCATION:  Education details: HEP (reps/sets/frequency), POC, Prognosis Person educated: Patient Education method: Explanation, Demonstration, and Handouts Education comprehension: verbalized understanding and needs further education  HOME EXERCISE PROGRAM: Access Code: [email or web address] URL: [email or web address] Date: 03/16/2023 Prepared by: Ronnie Derby  Exercises - Mini Squat  - 1 x daily - 4-5 x weekly - 3 sets - 8 reps - Standing Hip Abduction with Resistance at Ankles and Counter Support  - 1 x daily - 4-5 x weekly - 3 sets - 8 reps - Single Leg Stance  - 1 x daily - 7 x weekly - 1 sets - 3 reps - 20 hold - Standing Alternating Partial Lunge  - 1 x daily - 3-4 x weekly - 3 sets - 6 reps    Access Code: [email or web address] URL: [email or web address] Date: 01/21/2023 Prepared by: Ronnie Derby  Exercises - Mini Squat  - 1 x daily - 4-5 x weekly - 3 sets - 8 reps - Standing Hip Abduction with Resistance at Ankles and Counter Support  - 1 x daily - 4-5 x weekly - 3 sets - 8 reps  ASSESSMENT:  CLINICAL IMPRESSION:   PT arrived to [facility] with a main focus on reassessing progress towards goals. Throughout his POC the patient has made significant progress towards his PT goals. Today he met two long goals: he demonstrated improvements in prone hip ext ROM as follows 05/06/23:14/13 degrees; 05/06/2023: 21 reps. The patient  has also demonstrated progress towards improving over dynamic functional and  LE strength as indicated with his improved LLE 1RM on leg press (105lbs) and improvement on minibest from 21 to 24/28. The patient still has deficits with ankle stability in functional tasks such as uneven surfaces, inclined ramps, single leg balance and lateral (L > R) sided compensatory stepping indicating falls risk if needed to correct. Based on today's performance the would benefit from continued PT in order to make progress towards maximizing his return to PLOF and reducing falls risk. The patient's condition has the potential to continue to significantly improve in response to skilled physical therapy. Maximum improvement is yet to be obtained. The anticipated improvement is attainable and reasonable in a generally predictable time.   OBJECTIVE IMPAIRMENTS: Abnormal gait, decreased mobility, decreased ROM, and decreased strength.   ACTIVITY LIMITATIONS: squatting, stairs, and locomotion level  PARTICIPATION LIMITATIONS: community activity and occupation  PERSONAL FACTORS: Age, Fitness, Past/current experiences, and Time since onset of injury/illness/exacerbation are also affecting patient's functional outcome.   REHAB POTENTIAL: Excellent  CLINICAL DECISION MAKING: Stable/uncomplicated  EVALUATION COMPLEXITY: Low   GOALS: Goals reviewed with patient? No  SHORT TERM GOALS: Target date: 04/13/23 Pt will be independent with HEP to improve hip AROM and strength to return to PLOF. Baseline: 01/21/23: provided HEP; 03/16/23: Compliant with HEP about 50% Goal status: PARTIALLY MET  2.  Pt will demonstrate improved L hip extension equal to R hip to demo significant improvement in ability to produce greater power production with transfers and gait.  Baseline: 01/21/23: R/L: 12/4 degrees; 03/16/23: 15/8 degrees; 05/06/23:14/13 degrees  Goal status: ON GOING  LONG TERM GOALS: Target date: 06/17/2023  Pt will improve FOTO to target score to demonstrate clinically significant improvement in  functional mobility.  Baseline: 01/21/23: deferred to next session; 03/16/23: no longer used in this clinic Goal status: DEFERRED  2.  Pt will improve 30 sec STS to age matched norms to demonstrate improve LE power production and endurance for transfers and gait. Baseline: 01/21/23: 13 reps; 03/16/23: 16 reps; 05/06/2023: 21 reps  Goal status: MET  3.  Pt will improve LLE 1RM leg press to RLE to demo return LLE symmetrical strength to non involved limb.  Baseline: 01/21/23: 115#/85# R/L; 03/16/23: LLE 85#; 05/06/2023: 105# Goal status: ON GOING  4.  Pt will improve MiniBES Test by at least 4 points to demonstrate significant improvement in reduced falls risk with dynamic balance activities in community.  Baseline: 01/21/23: Deferred to next session; 03/16/23: 21/28 05/06/2023: 24/28 Goal status: On Going   PLAN:  PT FREQUENCY: 1-2x/week  PT DURATION: 8 weeks  PLANNED INTERVENTIONS: 97164- PT Re-evaluation, 97110-Therapeutic exercises, 97530- Therapeutic activity, 97112- Neuromuscular re-education, 97535- Self Care, 95638- Manual therapy, [phone]- Gait training, Patient/Family education, Balance training, Stair training, Dry Needling, Joint mobilization, Cryotherapy, and Moist heat  PLAN FOR NEXT SESSION: LLE strengthening. Dual tasking (cognitive and motor), unstable surfaces and single limb stance.   Christene Lye PT, DPT Physical Therapist- [facility]  05/06/2023, 11:09 PM

## 2023-05-10 ENCOUNTER — Ambulatory Visit

## 2023-05-10 DIAGNOSIS — M6281 Muscle weakness (generalized): Secondary | ICD-10-CM

## 2023-05-10 DIAGNOSIS — M25552 Pain in left hip: Secondary | ICD-10-CM | POA: Diagnosis not present

## 2023-05-10 DIAGNOSIS — R262 Difficulty in walking, not elsewhere classified: Secondary | ICD-10-CM | POA: Diagnosis not present

## 2023-05-10 NOTE — Therapy (Signed)
 OUTPATIENT PHYSICAL THERAPY LOWER EXTREMITY TREATMENT PROGRESS NOTE Dates of reporting period  01/21/2023   to   05/10/2023   Patient Name: Thomas Williamson MRN: [id number] DOB:11-10-1975, 48 y.o., male Today's Date: 05/10/2023  END OF SESSION:  PT End of Session - 05/10/23 1601     Visit Number 10    Number of Visits 19    Date for PT Re-Evaluation 06/17/23    PT Start Time 1601    PT Stop Time 1632    PT Time Calculation (min) 31 min    Activity Tolerance Patient tolerated treatment well    Behavior During Therapy [facility] for tasks assessed/performed              Past Medical History:  Diagnosis Date   Acetabulum fracture, left (HCC) 06/30/2022   Alcohol use disorder, severe, in early remission (HCC) 03/30/2018   Allergy    Anxiety    Bipolar disorder, in partial remission, most recent episode hypomanic (HCC) 03/30/2018   [facility]   Bleeding internal hemorrhoids 09/12/2013   Cannabis dependence (HCC) 07/13/2018   Daily use   Cervical disc disease 11/20/2014   Chronic anxiety 07/13/2018   Chronic systolic (congestive) heart failure (HCC)    Complication of anesthesia    PONV   Depression    Dyslipidemia    GERD (gastroesophageal reflux disease)    Hernia, inguinal, bilateral 02/24/2011   High ankle sprain of right lower extremity 01/16/2015   History of hiatal hernia    Hx of hepatitis C 10/05/2017   -treated in 2019 -hepatology recommended no further surveillance needed except for LFTs with labs and see hepatologist if elevated   Hypertension    Lipids abnormal 09/29/2013   Pneumonia    PONV (postoperative nausea and vomiting)    Vitamin D deficiency 07/01/2022   Past Surgical History:  Procedure Laterality Date   ANTERIOR CRUCIATE LIGAMENT REPAIR  2010   BIOPSY  03/13/2019   Procedure: BIOPSY;  Surgeon: Benancio Deeds, MD;  Location: [facility];  Service: Gastroenterology;;   COLONOSCOPY N/A 03/13/2019   per Dr.  Adela Lank, adenomatous polyps, repeat in 3 yrs   COLONOSCOPY WITH PROPOFOL N/A 02/11/2023   Procedure: COLONOSCOPY WITH PROPOFOL;  Surgeon: Benancio Deeds, MD;  Location: [facility];  Service: Gastroenterology;  Laterality: N/A;   INGUINAL HERNIA REPAIR  02/23/2012   Procedure: LAPAROSCOPIC BILATERAL INGUINAL HERNIA REPAIR;  Surgeon: Kandis Cocking, MD;  Location: [facility];  Service: General;  Laterality: Bilateral;  Laparoscopic Bilateral Inguinal Hernia Repair with mesh   INSERTION OF MESH  02/23/2012   Procedure: INSERTION OF MESH;  Surgeon: Kandis Cocking, MD;  Location: [facility];  Service: General;  Laterality: N/A;   NOSE SURGERY  [id number]   ORIF ACETABULAR FRACTURE Left 06/30/2022   Procedure: OPEN REDUCTION INTERNAL FIXATION (ORIF) ACETABULAR FRACTURE;  Surgeon: Myrene Galas, MD;  Location: [facility];  Service: Orthopedics;  Laterality: Left;   POLYPECTOMY  03/13/2019   Procedure: POLYPECTOMY;  Surgeon: Benancio Deeds, MD;  Location: [facility];  Service: Gastroenterology;;   POLYPECTOMY  02/11/2023   Procedure: POLYPECTOMY;  Surgeon: Benancio Deeds, MD;  Location: [facility];  Service: Gastroenterology;;   Patient Active Problem List   Diagnosis Date Noted   Abnormal LFTs 07/08/2022   Left foot drop 07/01/2022   Vitamin D deficiency 07/01/2022   Acetabulum fracture, left (HCC) 06/30/2022   Alcohol dependence (HCC) 01/05/2022   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  Alcohol-induced acute pancreatitis without infection or necrosis 12/05/2021   Genetic testing 04/21/2019   Benign neoplasm of colon    Loose stools    History of colonic polyps 03/07/2019   Cardiac left ventricular ejection fraction 30-35 percent 03/07/2019   Alcoholic ketoacidosis 11/18/2018   Dyslipidemia 09/16/2018   Fever blister 09/16/2018   Cardiomyopathy, alcoholic (HCC) 09/16/2018   Left bundle branch block (LBBB) 09/16/2018   Cannabis dependence (HCC) 07/13/2018   Chronic  anxiety 07/13/2018   Bipolar 1 disorder, depressed, full remission (HCC) 03/30/2018   Cervical disc disease 11/20/2014   Hypertension 02/24/2011    PCP: Nelwyn Salisbury, MD  REFERRING PROVIDER: Montez Morita PA-C  REFERRING DIAG:  Z61.096E (ICD-10-CM) - Closed fracture of acetabulum with dislocation of hip with delayed healing, left  M21.372 (ICD-10-CM) - Left foot drop    THERAPY DIAG:  Difficulty in walking, not elsewhere classified  Muscle weakness (generalized)  Rationale for Evaluation and Treatment: Rehabilitation  ONSET DATE: 06/30/22  SUBJECTIVE:   SUBJECTIVE STATEMENT:   Patient report that things have been going well. Adherent to HEP. No recent falls in the last week. Donned ankle brace prior to start of session. No additional questions or concerns.   PERTINENT HISTORY: Pt returning from "mental health" program in [place]. Reports still dealing with L foot drop. Planning to see foot specialist. Denies L hip pain but still having dorsal pain on his foot like before. Reports walking a lot in [place] but not a lot of strength training. Reports still dealing with his dynamic balance relying on stepping strategy to correct balance, still feels his LLE strength is lesser than RLE. Reports having 2 falls while away. Tripped his L toe carrying his laundry basket one time and then tripping going up the stairs. Does state he was not wearing his ankle brace those two times. Also having trouble with balance when applied to unstable surfaces especially combined with cognitive dual tasking.   PAIN:  Are you having pain? No  PRECAUTIONS: None  RED FLAGS: None   WEIGHT BEARING RESTRICTIONS: No  FALLS:  Has patient fallen in last 6 months? Yes. Number of falls 2  LIVING ENVIRONMENT: Lives with: lives with their family Lives in: House/apartment Stairs: No Has following equipment at home: Dan Humphreys - 2 wheeled  OCCUPATION: Press photographer for disability  PLOF:  Independent  PATIENT GOALS: Improve balance and strength   NEXT MD VISIT: N/A  OBJECTIVE:  Note: Objective measures were completed at Evaluation unless otherwise noted.  DIAGNOSTIC FINDINGS: N/A  PATIENT SURVEYS:  FOTO deferred to next session  COGNITION: Overall cognitive status: Within functional limits for tasks assessed     SENSATION: WFL  POSTURE: weight shift right  PALPATION: Mild TTP at LLE 1st MTP joint  LOWER EXTREMITY ROM:   AROM/PROM Right eval Left eval  Hip flexion 120 112  Hip extension 12 4  Hip abduction    Hip adduction    Hip internal rotation 40 40  Hip external rotation 45 42  Knee flexion    Knee extension    Ankle dorsiflexion 10 ~60-70 deg in plantar flexion 0 PROM  Ankle plantarflexion    Ankle inversion    Ankle eversion     (Blank rows = not tested)  LOWER EXTREMITY MMT:  MMT Right eval Left eval  Hip flexion 5 4  Hip extension 4 3+  Hip abduction 4 3+  Hip adduction    Hip internal rotation 5 5  Hip external rotation 5  5  Knee flexion 5 5  Knee extension 5 5  Ankle dorsiflexion 5 0  Ankle plantarflexion    Ankle inversion    Ankle eversion     (Blank rows = not tested)  LOWER EXTREMITY SPECIAL TESTS:  NA  FUNCTIONAL TESTS:  30 second STS test: 13 reps  FGA: 26/30  Single leg 1 RM leg press R/L: 115 #/85# MiniBesTest: Deferred to next session; 03/16/23: 21/28  GAIT: Distance walked: 10 meters Assistive device utilized: None Level of assistance: Complete Independence Comments: Normalized, reciprocal gait except for L ankle drop foot with no eccentric control with initial contact.                                                                                                                                TREATMENT DATE: 05/10/2023  There.ex: Partial Squats with 20lb KB: 3 x 8, VC for deeper range  Forward Lunge onto 1st step at stairs 1 x 10; pt reported pain in left ankle.   Seated Hamstring Curl against  blue TB (PT anchored): 2 x 10 ea leg  Seated LAQ against PT resistance: 2 x 10 ea leg  Neuromuscular Re-Education:   Ambulance person. 8 minutes with 4# AW's donned. Ankle weights donned in order to improve joint proprioception and step height. x 3 forward/retro direction x 3 medial/lateral direction. Front Crossover stepping on decline/incline x 3 facing building   PATIENT EDUCATION:  Education details: HEP (reps/sets/frequency), POC, Prognosis Person educated: Patient Education method: Explanation, Demonstration, and Handouts Education comprehension: verbalized understanding and needs further education  HOME EXERCISE PROGRAM: Access Code: [email or web address] URL: [email or web address] Date: 03/16/2023 Prepared by: Ronnie Derby  Exercises - Mini Squat  - 1 x daily - 4-5 x weekly - 3 sets - 8 reps - Standing Hip Abduction with Resistance at Ankles and Counter Support  - 1 x daily - 4-5 x weekly - 3 sets - 8 reps - Single Leg Stance  - 1 x daily - 7 x weekly - 1 sets - 3 reps - 20 hold - Standing Alternating Partial Lunge  - 1 x daily - 3-4 x weekly - 3 sets - 6 reps    Access Code: [email or web address] URL: [email or web address] Date: 01/21/2023 Prepared by: Ronnie Derby  Exercises - Mini Squat  - 1 x daily - 4-5 x weekly - 3 sets - 8 reps - Standing Hip Abduction with Resistance at Ankles and Counter Support  - 1 x daily - 4-5 x weekly - 3 sets - 8 reps  ASSESSMENT:  CLINICAL IMPRESSION:   PT arrived to [facility]PPT in order to improve LE strength and dynamic balance. Session shortened due to nerve pain in the left anterior ankle. Since his initial visit the patient has met all STGs and has met one LTG. Last session PT reassess progress towards goals, the patient has demonstrated improved LE strength by increasing his 30s STS repetition (16 to 21reps) and LE  1RM on the leg press (85 to 105#). He has been making progress towards dynamic balance as indicated on his recent  result on his miniBest (21/28 to 24/28). At this time the patient has demonstrated significant progress throughout his POC has endorsed adherence with HEP in order to maximize his return to PLOF and decrease fall risk. Based on today's performance, patient will continue to benefit from skilled PT in order to address current deficits and facilitate return to PLOF.  OBJECTIVE IMPAIRMENTS: Abnormal gait, decreased mobility, decreased ROM, and decreased strength.   ACTIVITY LIMITATIONS: squatting, stairs, and locomotion level  PARTICIPATION LIMITATIONS: community activity and occupation  PERSONAL FACTORS: Age, Fitness, Past/current experiences, and Time since onset of injury/illness/exacerbation are also affecting patient's functional outcome.   REHAB POTENTIAL: Excellent  CLINICAL DECISION MAKING: Stable/uncomplicated  EVALUATION COMPLEXITY: Low   GOALS: Goals reviewed with patient? No  SHORT TERM GOALS: Target date: 04/13/23 Pt will be independent with HEP to improve hip AROM and strength to return to PLOF. Baseline: 01/21/23: provided HEP; 03/16/23: Compliant with HEP about 50% 05/10/2023: Compliant with HEP at 100% Goal status: PARTIALLY MET  2.  Pt will demonstrate improved L hip extension equal to R hip to demo significant improvement in ability to produce greater power production with transfers and gait.  Baseline: 01/21/23: R/L: 12/4 degrees; 03/16/23: 15/8 degrees; 05/06/23:14/13 degrees  Goal status: MET  LONG TERM GOALS: Target date: 06/17/2023  Pt will improve FOTO to target score to demonstrate clinically significant improvement in functional mobility.  Baseline: 01/21/23: deferred to next session; 03/16/23: no longer used in this clinic Goal status: DEFERRED  2.  Pt will improve 30 sec STS to age matched norms to demonstrate improve LE power production and endurance for transfers and gait. Baseline: 01/21/23: 13 reps; 03/16/23: 16 reps; 05/06/2023: 21 reps  Goal status:  MET  3.  Pt will improve LLE 1RM leg press to RLE to demo return LLE symmetrical strength to non involved limb.  Baseline: 01/21/23: 115#/85# R/L; 03/16/23: LLE 85#; 05/06/2023: 105# Goal status: ON GOING  4.  Pt will improve MiniBES Test by at least 4 points to demonstrate significant improvement in reduced falls risk with dynamic balance activities in community.  Baseline: 01/21/23: Deferred to next session; 03/16/23: 21/28 05/06/2023: 24/28 Goal status: On Going   PLAN:  PT FREQUENCY: 1-2x/week  PT DURATION: 8 weeks  PLANNED INTERVENTIONS: 97164- PT Re-evaluation, 97110-Therapeutic exercises, 97530- Therapeutic activity, 97112- Neuromuscular re-education, 97535- Self Care, 11914- Manual therapy, [phone]- Gait training, Patient/Family education, Balance training, Stair training, Dry Needling, Joint mobilization, Cryotherapy, and Moist heat  PLAN FOR NEXT SESSION: LLE strengthening. Dual tasking (cognitive and motor), improving balance unstable surfaces and single limb stance, Stepping reaction training.   Christene Lye PT, DPT Physical Therapist- [facility]  05/10/2023, 4:47 PM

## 2023-05-16 ENCOUNTER — Other Ambulatory Visit: Payer: Self-pay | Admitting: Family Medicine

## 2023-05-18 ENCOUNTER — Ambulatory Visit

## 2023-05-18 DIAGNOSIS — M25552 Pain in left hip: Secondary | ICD-10-CM

## 2023-05-18 DIAGNOSIS — R262 Difficulty in walking, not elsewhere classified: Secondary | ICD-10-CM

## 2023-05-18 DIAGNOSIS — M6281 Muscle weakness (generalized): Secondary | ICD-10-CM

## 2023-05-18 NOTE — Therapy (Signed)
 OUTPATIENT PHYSICAL THERAPY LOWER EXTREMITY TREATMENT   Patient Name: Thomas Williamson MRN: [id number] DOB:[birth year], 48 y.o., male Today's Date: 05/18/2023  END OF SESSION:  PT End of Session - 05/18/23 1727     Visit Number 11    Number of Visits 19    Date for PT Re-Evaluation 06/17/23    PT Start Time 1728    PT Stop Time 1804    PT Time Calculation (min) 36 min    Activity Tolerance Patient tolerated treatment well    Behavior During Therapy [facility] for tasks assessed/performed              Past Medical History:  Diagnosis Date   Acetabulum fracture, left (HCC) 06/30/2022   Alcohol use disorder, severe, in early remission (HCC) 03/30/2018   Allergy    Anxiety    Bipolar disorder, in partial remission, most recent episode hypomanic (HCC) 03/30/2018   [facility]   Bleeding internal hemorrhoids 09/12/2013   Cannabis dependence (HCC) 07/13/2018   Daily use   Cervical disc disease 11/20/2014   Chronic anxiety 07/13/2018   Chronic systolic (congestive) heart failure (HCC)    Complication of anesthesia    PONV   Depression    Dyslipidemia    GERD (gastroesophageal reflux disease)    Hernia, inguinal, bilateral 02/24/2011   High ankle sprain of right lower extremity 01/16/2015   History of hiatal hernia    Hx of hepatitis C 10/05/2017   -treated in 2019 -hepatology recommended no further surveillance needed except for LFTs with labs and see hepatologist if elevated   Hypertension    Lipids abnormal 09/29/2013   Pneumonia    PONV (postoperative nausea and vomiting)    Vitamin D deficiency 07/01/2022   Past Surgical History:  Procedure Laterality Date   ANTERIOR CRUCIATE LIGAMENT REPAIR  2010   BIOPSY  03/13/2019   Procedure: BIOPSY;  Surgeon: Benancio Deeds, MD;  Location: [facility];  Service: Gastroenterology;;   COLONOSCOPY N/A 03/13/2019   per Dr. Adela Lank, adenomatous polyps, repeat in 3 yrs   COLONOSCOPY WITH  PROPOFOL N/A 02/11/2023   Procedure: COLONOSCOPY WITH PROPOFOL;  Surgeon: Benancio Deeds, MD;  Location: [facility];  Service: Gastroenterology;  Laterality: N/A;   INGUINAL HERNIA REPAIR  02/23/2012   Procedure: LAPAROSCOPIC BILATERAL INGUINAL HERNIA REPAIR;  Surgeon: Kandis Cocking, MD;  Location: [facility];  Service: General;  Laterality: Bilateral;  Laparoscopic Bilateral Inguinal Hernia Repair with mesh   INSERTION OF MESH  02/23/2012   Procedure: INSERTION OF MESH;  Surgeon: Kandis Cocking, MD;  Location: [facility];  Service: General;  Laterality: N/A;   NOSE SURGERY  [id number]   ORIF ACETABULAR FRACTURE Left 06/30/2022   Procedure: OPEN REDUCTION INTERNAL FIXATION (ORIF) ACETABULAR FRACTURE;  Surgeon: Myrene Galas, MD;  Location: [facility];  Service: Orthopedics;  Laterality: Left;   POLYPECTOMY  03/13/2019   Procedure: POLYPECTOMY;  Surgeon: Benancio Deeds, MD;  Location: [facility];  Service: Gastroenterology;;   POLYPECTOMY  02/11/2023   Procedure: POLYPECTOMY;  Surgeon: Benancio Deeds, MD;  Location: [facility];  Service: Gastroenterology;;   Patient Active Problem List   Diagnosis Date Noted   Abnormal LFTs 07/08/2022   Left foot drop 07/01/2022   Vitamin D deficiency 07/01/2022   Acetabulum fracture, left (HCC) 06/30/2022   Alcohol dependence (HCC) 01/05/2022   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   Alcohol-induced acute pancreatitis without infection or necrosis 12/05/2021   Genetic testing 04/21/2019  Benign neoplasm of colon    Loose stools    History of colonic polyps 03/07/2019   Cardiac left ventricular ejection fraction 30-35 percent 03/07/2019   Alcoholic ketoacidosis 11/18/2018   Dyslipidemia 09/16/2018   Fever blister 09/16/2018   Cardiomyopathy, alcoholic (HCC) 09/16/2018   Left bundle branch block (LBBB) 09/16/2018   Cannabis dependence (HCC) 07/13/2018   Chronic anxiety 07/13/2018   Bipolar 1 disorder, depressed, full remission  (HCC) 03/30/2018   Cervical disc disease 11/20/2014   Hypertension 02/24/2011    PCP: Nelwyn Salisbury, MD  REFERRING PROVIDER: Montez Morita PA-C  REFERRING DIAG:  K44.010U (ICD-10-CM) - Closed fracture of acetabulum with dislocation of hip with delayed healing, left  M21.372 (ICD-10-CM) - Left foot drop    THERAPY DIAG:  Difficulty in walking, not elsewhere classified  Muscle weakness (generalized)  Pain in left hip  Rationale for Evaluation and Treatment: Rehabilitation  ONSET DATE: 06/30/22  SUBJECTIVE:   SUBJECTIVE STATEMENT:   Pt reports things have been going well; no falls. 6/10 NPS in the L foot.No questions or concerns.    PERTINENT HISTORY: Pt returning from "mental health" program in [place]. Reports still dealing with L foot drop. Planning to see foot specialist. Denies L hip pain but still having dorsal pain on his foot like before. Reports walking a lot in [place] but not a lot of strength training. Reports still dealing with his dynamic balance relying on stepping strategy to correct balance, still feels his LLE strength is lesser than RLE. Reports having 2 falls while away. Tripped his L toe carrying his laundry basket one time and then tripping going up the stairs. Does state he was not wearing his ankle brace those two times. Also having trouble with balance when applied to unstable surfaces especially combined with cognitive dual tasking.   PAIN:  Are you having pain? No  PRECAUTIONS: None  RED FLAGS: None   WEIGHT BEARING RESTRICTIONS: No  FALLS:  Has patient fallen in last 6 months? Yes. Number of falls 2  LIVING ENVIRONMENT: Lives with: lives with their family Lives in: House/apartment Stairs: No Has following equipment at home: Dan Humphreys - 2 wheeled  OCCUPATION: Press photographer for disability  PLOF: Independent  PATIENT GOALS: Improve balance and strength   NEXT MD VISIT: N/A  OBJECTIVE:  Note: Objective measures were completed at  Evaluation unless otherwise noted.  DIAGNOSTIC FINDINGS: N/A  PATIENT SURVEYS:  FOTO deferred to next session  COGNITION: Overall cognitive status: Within functional limits for tasks assessed     SENSATION: WFL  POSTURE: weight shift right  PALPATION: Mild TTP at LLE 1st MTP joint  LOWER EXTREMITY ROM:   AROM/PROM Right eval Left eval  Hip flexion 120 112  Hip extension 12 4  Hip abduction    Hip adduction    Hip internal rotation 40 40  Hip external rotation 45 42  Knee flexion    Knee extension    Ankle dorsiflexion 10 ~60-70 deg in plantar flexion 0 PROM  Ankle plantarflexion    Ankle inversion    Ankle eversion     (Blank rows = not tested)  LOWER EXTREMITY MMT:  MMT Right eval Left eval  Hip flexion 5 4  Hip extension 4 3+  Hip abduction 4 3+  Hip adduction    Hip internal rotation 5 5  Hip external rotation 5 5  Knee flexion 5 5  Knee extension 5 5  Ankle dorsiflexion 5 0  Ankle plantarflexion  Ankle inversion    Ankle eversion     (Blank rows = not tested)  LOWER EXTREMITY SPECIAL TESTS:  NA  FUNCTIONAL TESTS:  30 second STS test: 13 reps  FGA: 26/30  Single leg 1 RM leg press R/L: 115 #/85# MiniBesTest: Deferred to next session; 03/16/23: 21/28  GAIT: Distance walked: 10 meters Assistive device utilized: None Level of assistance: Complete Independence Comments: Normalized, reciprocal gait except for L ankle drop foot with no eccentric control with initial contact.                                                                                                                                TREATMENT DATE: 05/18/2023  There.ex:  Harvest Dark Machine:   Single Leg Press (LLE):    1 x 8 55#    2 x 10 45#   Lateral Step Down with Rail support:   2 x 8 - Seated rest break in b/t sets   STS from Decline Ramp onto Mat Table in order to challenge balance and increase LE strength:   2 x 10 BLE on decline    Neuromuscular  Re-Education:   Ambulance person. 6 minutes with 5# AW's donned. Ankle weights donned in order to improve joint proprioception and step height. X2 forward/retro direction. Front/Retro Crossover stepping in grass.   Single leg Stance balance Progression in order to improve SLS stance (Donned 5# AW):   2 x 10s ea leg, Single UE Support   1 x 10s ea leg, no UE support with R SLS  Dynamic Balance Activities (In order to simulate real life events that require variations of BoS):   Tandem Walking next to treadmill in order to improve narrow BoS    x4 Trials - Able to increase consecutive stepping with additional reps   Side Stepping on Airex Beam    x4 Trials     Side Stepping on Airex with 3 Small Hurdles in between    x4 Trials - Intermittent UE support with L SLS, notable ankle righting reactions with R SLS.      PATIENT EDUCATION:  Education details: HEP (reps/sets/frequency), POC, Prognosis Person educated: Patient Education method: Explanation, Demonstration, and Handouts Education comprehension: verbalized understanding and needs further education  HOME EXERCISE PROGRAM: Access Code: [email or web address] URL: [email or web address] Date: 05/18/2023 Prepared by: Christene Lye  Exercises - Mini Squat  - 1 x daily - 4-5 x weekly - 3 sets - 8 reps - Standing Hip Abduction with Resistance at Ankles and Counter Support  - 1 x daily - 4-5 x weekly - 3 sets - 8 reps - Single Leg Stance  - 1 x daily - 7 x weekly - 1 sets - 3 reps - 20 hold - Standing Alternating Partial Lunge  - 1 x daily - 3-4 x weekly - 3 sets - 6 reps - Lateral Step Down  - 1 x daily - 7 x weekly - 3  sets - 10 reps  Access Code: [email or web address] URL: [email or web address] Date: 03/16/2023 Prepared by: Lyda Samples  Exercises - Mini Squat  - 1 x daily - 4-5 x weekly - 3 sets - 8 reps - Standing Hip Abduction with Resistance at Ankles and Counter Support  - 1 x daily - 4-5 x weekly - 3 sets - 8  reps - Single Leg Stance  - 1 x daily - 7 x weekly - 1 sets - 3 reps - 20 hold - Standing Alternating Partial Lunge  - 1 x daily - 3-4 x weekly - 3 sets - 6 reps    Access Code: [email or web address] URL: [email or web address] Date: 01/21/2023 Prepared by: Lyda Samples  Exercises - Mini Squat  - 1 x daily - 4-5 x weekly - 3 sets - 8 reps - Standing Hip Abduction with Resistance at Ankles and Counter Support  - 1 x daily - 4-5 x weekly - 3 sets - 8 reps  ASSESSMENT:  CLINICAL IMPRESSION:   Pt arrived to OPPT in order improve LE strength and dynamic balance. Session shortened due to pain in the anterior ankle and fatigue. Pt continues to tolerate exercises challenging SLS on the L and improving functional strength. Additionally he tolerated increased intensity and weight with RLE strengthening. PT plan to continue challenging dynamic balance and working to increase LE strength as we progress towards end of POC. Based on today's performance the patient will continue to benefit from skilled physical therapy in order to address current deficits and maximize return to PLOF.   OBJECTIVE IMPAIRMENTS: Abnormal gait, decreased mobility, decreased ROM, and decreased strength.   ACTIVITY LIMITATIONS: squatting, stairs, and locomotion level  PARTICIPATION LIMITATIONS: community activity and occupation  PERSONAL FACTORS: Age, Fitness, Past/current experiences, and Time since onset of injury/illness/exacerbation are also affecting patient's functional outcome.   REHAB POTENTIAL: Excellent  CLINICAL DECISION MAKING: Stable/uncomplicated  EVALUATION COMPLEXITY: Low   GOALS: Goals reviewed with patient? No  SHORT TERM GOALS: Target date: 04/13/23 Pt will be independent with HEP to improve hip AROM and strength to return to PLOF. Baseline: 01/21/23: provided HEP; 03/16/23: Compliant with HEP about 50% 05/10/2023: Compliant with HEP at 100% Goal status: PARTIALLY MET  2.  Pt will demonstrate  improved L hip extension equal to R hip to demo significant improvement in ability to produce greater power production with transfers and gait.  Baseline: 01/21/23: R/L: 12/4 degrees; 03/16/23: 15/8 degrees; 05/06/23:14/13 degrees  Goal status: MET  LONG TERM GOALS: Target date: 06/17/2023  Pt will improve FOTO to target score to demonstrate clinically significant improvement in functional mobility.  Baseline: 01/21/23: deferred to next session; 03/16/23: no longer used in this clinic Goal status: DEFERRED  2.  Pt will improve 30 sec STS to age matched norms to demonstrate improve LE power production and endurance for transfers and gait. Baseline: 01/21/23: 13 reps; 03/16/23: 16 reps; 05/06/2023: 21 reps  Goal status: MET  3.  Pt will improve LLE 1RM leg press to RLE to demo return LLE symmetrical strength to non involved limb.  Baseline: 01/21/23: 115#/85# R/L; 03/16/23: LLE 85#; 05/06/2023: 105# Goal status: ON GOING  4.  Pt will improve MiniBES Test by at least 4 points to demonstrate significant improvement in reduced falls risk with dynamic balance activities in community.  Baseline: 01/21/23: Deferred to next session; 03/16/23: 21/28 05/06/2023: 24/28 Goal status: On Going   PLAN:  PT FREQUENCY: 1-2x/week  PT DURATION: 8 weeks  PLANNED INTERVENTIONS: 97164- PT Re-evaluation, 97110-Therapeutic exercises, 97530-  Therapeutic activity, [id number]- Neuromuscular re-education, [id number]- Self Care, 13086- Manual therapy, [phone]- Gait training, Patient/Family education, Balance training, Stair training, Dry Needling, Joint mobilization, Cryotherapy, and Moist heat  PLAN FOR NEXT SESSION: LLE strengthening. Dual tasking (cognitive and motor), improving balance unstable surfaces and single limb stance, Stepping reaction training.   Satira Curet PT, DPT Physical Therapist- [facility]  05/18/2023, 6:24 PM

## 2023-05-19 DIAGNOSIS — F331 Major depressive disorder, recurrent, moderate: Secondary | ICD-10-CM | POA: Diagnosis not present

## 2023-05-19 DIAGNOSIS — F1020 Alcohol dependence, uncomplicated: Secondary | ICD-10-CM | POA: Diagnosis not present

## 2023-05-20 DIAGNOSIS — F332 Major depressive disorder, recurrent severe without psychotic features: Secondary | ICD-10-CM | POA: Diagnosis not present

## 2023-05-20 DIAGNOSIS — F411 Generalized anxiety disorder: Secondary | ICD-10-CM | POA: Diagnosis not present

## 2023-05-20 DIAGNOSIS — F1029 Alcohol dependence with unspecified alcohol-induced disorder: Secondary | ICD-10-CM | POA: Diagnosis not present

## 2023-06-02 ENCOUNTER — Ambulatory Visit

## 2023-06-02 DIAGNOSIS — F1020 Alcohol dependence, uncomplicated: Secondary | ICD-10-CM | POA: Diagnosis not present

## 2023-06-02 DIAGNOSIS — F331 Major depressive disorder, recurrent, moderate: Secondary | ICD-10-CM | POA: Diagnosis not present

## 2023-06-03 ENCOUNTER — Ambulatory Visit: Attending: Orthopedic Surgery

## 2023-06-03 ENCOUNTER — Telehealth: Payer: Self-pay

## 2023-06-03 NOTE — Telephone Encounter (Signed)
 Called patient about missed appointment. No answer but LVM  and Advised of no show policy and to call for concerns. No other appts scheduled and PT auth ended. Advised pt to call for additional concerns or call insurance regarding PT visit auth.

## 2023-06-08 ENCOUNTER — Telehealth: Payer: Self-pay

## 2023-06-08 ENCOUNTER — Other Ambulatory Visit: Payer: Self-pay | Admitting: Family Medicine

## 2023-06-08 NOTE — Telephone Encounter (Signed)
 Copied from CRM [id number]. Topic: Clinical - Medication Question >> Jun 08, 2023 12:29 PM Aisha D wrote: Reason for CRM: Patient stated that he is needing a medication refill for the metoprolol  succinate (TOPROL -XL) 25 MG 24 hr tablet. Patient already has a medication refill request submitted and wanted to know if Dr.Fry could approve it because he is not due for a refill as of yet. Patient stated that he is prescribed to take the medication one daily but he takes the medication twice daily because it helps keep his heart rate down and that's why he ran out. Patient stated that he urgently needs the medication refilled because he needs it for his heart.

## 2023-06-08 NOTE — Telephone Encounter (Signed)
 Copied from CRM [id number]. Topic: Clinical - Medication Refill >> Jun 08, 2023 12:27 PM Thomas Williamson wrote: Most Recent Primary Care Visit:  Provider: Corita Diego A  Department: [facility]  Visit Type: OFFICE VISIT  Date: 02/01/2023  Medication: metoprolol  succinate (TOPROL -XL) 25 MG 24 hr tablet  Has the patient contacted their pharmacy? Yes (Agent: If no, request that the patient contact the pharmacy for the refill. If patient does not wish to contact the pharmacy document the reason why and proceed with request.) (Agent: If yes, when and what did the pharmacy advise?)  Is this the correct pharmacy for this prescription? Yes If no, delete pharmacy and type the correct one.  This is the patient's preferred pharmacy:  [id number] [place] [facility] - [address] [place] [address] Phone: [phone] Fax: [phone]   Has the prescription been filled recently? No  Is the patient out of the medication? Yes  Has the patient been seen for an appointment in the last year OR does the patient have an upcoming appointment? Yes  Can we respond through MyChart? Yes  Agent: Please be advised that Rx refills may take up to 3 business days. We ask that you follow-up with your pharmacy.

## 2023-06-09 MED ORDER — METOPROLOL SUCCINATE ER 25 MG PO TB24: 25.0000 mg | ORAL_TABLET | Freq: Every day | ORAL | 1 refills | Status: DC

## 2023-06-09 NOTE — Telephone Encounter (Signed)
 Spoke with pt advised that Rx directions says to take one tab daily.Pt aware that refill was sent to his pharmacy

## 2023-06-16 DIAGNOSIS — F331 Major depressive disorder, recurrent, moderate: Secondary | ICD-10-CM | POA: Diagnosis not present

## 2023-06-16 DIAGNOSIS — F1020 Alcohol dependence, uncomplicated: Secondary | ICD-10-CM | POA: Diagnosis not present

## 2023-06-24 DIAGNOSIS — S76012A Strain of muscle, fascia and tendon of left hip, initial encounter: Secondary | ICD-10-CM | POA: Diagnosis not present

## 2023-06-24 DIAGNOSIS — X58XXXA Exposure to other specified factors, initial encounter: Secondary | ICD-10-CM | POA: Diagnosis not present

## 2023-06-30 DIAGNOSIS — F1020 Alcohol dependence, uncomplicated: Secondary | ICD-10-CM | POA: Diagnosis not present

## 2023-06-30 DIAGNOSIS — F331 Major depressive disorder, recurrent, moderate: Secondary | ICD-10-CM | POA: Diagnosis not present

## 2023-06-30 DIAGNOSIS — S32462D Displaced associated transverse-posterior fracture of left acetabulum, subsequent encounter for fracture with routine healing: Secondary | ICD-10-CM | POA: Diagnosis not present

## 2023-06-30 DIAGNOSIS — S73015D Posterior dislocation of left hip, subsequent encounter: Secondary | ICD-10-CM | POA: Diagnosis not present

## 2023-07-01 ENCOUNTER — Ambulatory Visit: Payer: BC Managed Care – PPO | Attending: Cardiology | Admitting: Cardiology

## 2023-07-01 ENCOUNTER — Encounter: Payer: Self-pay | Admitting: Cardiology

## 2023-07-01 VITALS — BP 102/70 | HR 92 | Ht 70.0 in | Wt 200.0 lb

## 2023-07-01 DIAGNOSIS — Z79899 Other long term (current) drug therapy: Secondary | ICD-10-CM

## 2023-07-01 DIAGNOSIS — R931 Abnormal findings on diagnostic imaging of heart and coronary circulation: Secondary | ICD-10-CM

## 2023-07-01 NOTE — Patient Instructions (Signed)
 Medication Instructions:  Your physician recommends that you continue on your current medications as directed. Please refer to the Current Medication list given to you today.  *If you need a refill on your cardiac medications before your next appointment, please call your pharmacy*  Lab Work: CMET, Mag If you have labs (blood work) drawn today and your tests are completely normal, you will receive your results only by: MyChart Message (if you have MyChart) OR A paper copy in the mail If you have any lab test that is abnormal or we need to change your treatment, we will call you to review the results.  Testing/Procedures: Your physician has requested that you have an echocardiogram. Echocardiography is a painless test that uses sound waves to create images of your heart. It provides your doctor with information about the size and shape of your heart and how well your heart's chambers and valves are working. This procedure takes approximately one hour. There are no restrictions for this procedure. Please do NOT wear cologne, perfume, aftershave, or lotions (deodorant is allowed). Please arrive 15 minutes prior to your appointment time.  Please note: We ask at that you not bring children with you during ultrasound (echo/ vascular) testing. Due to room size and safety concerns, children are not allowed in the ultrasound rooms during exams. Our front office staff cannot provide observation of children in our lobby area while testing is being conducted. An adult accompanying a patient to their appointment will only be allowed in the ultrasound room at the discretion of the ultrasound technician under special circumstances. We apologize for any inconvenience.   Follow-Up: At [facility], you and your health needs are our priority.  As part of our continuing mission to provide you with exceptional heart care, our providers are all part of one team.  This team includes your primary Cardiologist  (physician) and Advanced Practice Providers or APPs (Physician Assistants and Nurse Practitioners) who all work together to provide you with the care you need, when you need it.  Your next appointment:   6 month(s)  Provider:   Kardie Tobb, DO

## 2023-07-02 LAB — COMPREHENSIVE METABOLIC PANEL WITH GFR
ALT: 30 IU/L (ref 0–44)
AST: 24 IU/L (ref 0–40)
Albumin: 5.1 g/dL (ref 4.1–5.1)
Alkaline Phosphatase: 149 IU/L — ABNORMAL HIGH (ref 44–121)
BUN/Creatinine Ratio: 13 (ref 9–20)
BUN: 14 mg/dL (ref 6–24)
Bilirubin Total: 0.4 mg/dL (ref 0.0–1.2)
CO2: 20 mmol/L (ref 20–29)
Calcium: 10.4 mg/dL — ABNORMAL HIGH (ref 8.7–10.2)
Chloride: 97 mmol/L (ref 96–106)
Creatinine, Ser: 1.05 mg/dL (ref 0.76–1.27)
Globulin, Total: 2.7 g/dL (ref 1.5–4.5)
Glucose: 96 mg/dL (ref 70–99)
Potassium: 4.1 mmol/L (ref 3.5–5.2)
Sodium: 136 mmol/L (ref 134–144)
Total Protein: 7.8 g/dL (ref 6.0–8.5)
eGFR: 88 mL/min/{1.73_m2} (ref >59)

## 2023-07-02 LAB — MAGNESIUM: Magnesium: 1.9 mg/dL (ref 1.6–2.3)

## 2023-07-05 ENCOUNTER — Ambulatory Visit: Payer: Self-pay | Admitting: Cardiology

## 2023-07-06 ENCOUNTER — Encounter: Payer: Self-pay | Admitting: Family Medicine

## 2023-07-06 ENCOUNTER — Ambulatory Visit (INDEPENDENT_AMBULATORY_CARE_PROVIDER_SITE_OTHER): Admitting: Family Medicine

## 2023-07-06 VITALS — BP 110/84 | HR 83 | Temp 98.1°F | Ht 70.0 in | Wt 205.0 lb

## 2023-07-06 DIAGNOSIS — Z0000 Encounter for general adult medical examination without abnormal findings: Secondary | ICD-10-CM

## 2023-07-06 DIAGNOSIS — J359 Chronic disease of tonsils and adenoids, unspecified: Secondary | ICD-10-CM | POA: Insufficient documentation

## 2023-07-06 DIAGNOSIS — I10 Essential (primary) hypertension: Secondary | ICD-10-CM

## 2023-07-06 DIAGNOSIS — E7800 Pure hypercholesterolemia, unspecified: Secondary | ICD-10-CM | POA: Diagnosis not present

## 2023-07-06 DIAGNOSIS — N529 Male erectile dysfunction, unspecified: Secondary | ICD-10-CM | POA: Insufficient documentation

## 2023-07-06 LAB — LIPID PANEL
Cholesterol: 195 mg/dL (ref 0–200)
HDL: 43.2 mg/dL (ref >39.00)
LDL Cholesterol: 72 mg/dL (ref 0–99)
NonHDL: 151.72
Total CHOL/HDL Ratio: 5
Triglycerides: 398 mg/dL — ABNORMAL HIGH (ref 0.0–149.0)
VLDL: 79.6 mg/dL — ABNORMAL HIGH (ref 0.0–40.0)

## 2023-07-06 LAB — CBC WITH DIFFERENTIAL/PLATELET
Basophils Absolute: 0.1 10*3/uL (ref 0.0–0.1)
Basophils Relative: 0.9 % (ref 0.0–3.0)
Eosinophils Absolute: 0.1 10*3/uL (ref 0.0–0.7)
Eosinophils Relative: 1.5 % (ref 0.0–5.0)
HCT: 34.3 % — ABNORMAL LOW (ref 39.0–52.0)
Hemoglobin: 11.3 g/dL — ABNORMAL LOW (ref 13.0–17.0)
Lymphocytes Relative: 29.7 % (ref 12.0–46.0)
Lymphs Abs: 2.6 10*3/uL (ref 0.7–4.0)
MCHC: 32.9 g/dL (ref 30.0–36.0)
MCV: 84.3 fl (ref 78.0–100.0)
Monocytes Absolute: 0.7 10*3/uL (ref 0.1–1.0)
Monocytes Relative: 8.5 % (ref 3.0–12.0)
Neutro Abs: 5.2 10*3/uL (ref 1.4–7.7)
Neutrophils Relative %: 59.4 % (ref 43.0–77.0)
Platelets: 337 10*3/uL (ref 150.0–400.0)
RBC: 4.06 Mil/uL — ABNORMAL LOW (ref 4.22–5.81)
RDW: 15.1 % (ref 11.5–15.5)
WBC: 8.7 10*3/uL (ref 4.0–10.5)

## 2023-07-06 LAB — TSH: TSH: 1.41 u[IU]/mL (ref 0.35–5.50)

## 2023-07-06 LAB — PSA: PSA: 1.93 ng/mL (ref 0.10–4.00)

## 2023-07-06 LAB — TESTOSTERONE: Testosterone: 353.94 ng/dL (ref 300.00–890.00)

## 2023-07-06 LAB — HEMOGLOBIN A1C: Hgb A1c MFr Bld: 6.1 % (ref 4.6–6.5)

## 2023-07-06 MED ORDER — METOPROLOL SUCCINATE ER 25 MG PO TB24: 25.0000 mg | ORAL_TABLET | Freq: Every day | ORAL | 3 refills | Status: DC

## 2023-07-06 MED ORDER — PRAVASTATIN SODIUM 40 MG PO TABS: 40.0000 mg | ORAL_TABLET | Freq: Every day | ORAL | 3 refills | Status: AC

## 2023-07-06 MED ORDER — SILDENAFIL CITRATE 100 MG PO TABS: 100.0000 mg | ORAL_TABLET | Freq: Every day | ORAL | 11 refills | Status: AC | PRN

## 2023-07-06 MED ORDER — AMLODIPINE BESYLATE 10 MG PO TABS: 10.0000 mg | ORAL_TABLET | Freq: Every day | ORAL | 3 refills | Status: DC

## 2023-07-06 MED ORDER — LOSARTAN POTASSIUM-HCTZ 100-25 MG PO TABS: 1.0000 | ORAL_TABLET | Freq: Every day | ORAL | 3 refills | Status: DC

## 2023-07-06 NOTE — Progress Notes (Signed)
 Subjective:    Patient ID: Thomas Williamson, male    DOB: [birth year], 48 y.o.   MRN: [id number]  HPI Here for a well exam. In general he feels great. He stopped drinking alcohol  6 months ago, and he is very pleased with this. His BP has been stable. He sees Dr. Emmette Harms for cardiology care, and on 12-23-22 he had a myocardial perfusion scan. 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 His EF was 41%. He is scheduled for an ECHO on 08-19-23. He saw Dr. Virgina Grills on 04-28-23 to check a lesion on the left tonsil that his dentist noticed. He felt that this was a benign lesion, possibly a tonsillar tag. They agreed to monitor this rather than having it removed. [name] mentions frequent muscle cramps in his feet and hands. He had labs on 07-01-23 showing  normal potassium of 4.1 and a magnesium  of 1.9. Finally [name] complains of trouble getting and maintaining erections lately. He says his desire has not changed.    Review of Systems  Constitutional: Negative.   HENT: Negative.    Eyes: Negative.   Respiratory: Negative.    Cardiovascular: Negative.   Gastrointestinal: Negative.   Genitourinary: Negative.   Musculoskeletal:  Positive for myalgias.  Skin: Negative.   Neurological: Negative.   Psychiatric/Behavioral: Negative.         Objective:   Physical Exam Constitutional:      General: He is not in acute distress.    Appearance: Normal appearance. He is well-developed. He is not diaphoretic.  HENT:     Head: Normocephalic and atraumatic.     Right Ear: External ear normal.     Left Ear: External ear normal.     Nose: Nose normal.     Mouth/Throat:     Pharynx: No oropharyngeal exudate.     Comments: There is a 2 mm clear round lesion on the left tonsil  Eyes:     General: No scleral icterus.       Right eye: No discharge.        Left eye: No discharge.     Conjunctiva/sclera: Conjunctivae normal.     Pupils: Pupils are equal, round, and reactive to light.  Neck:      Thyroid : No thyromegaly.     Vascular: No JVD.     Trachea: No tracheal deviation.  Cardiovascular:     Rate and Rhythm: Normal rate and regular rhythm.     Pulses: Normal pulses.     Heart sounds: Normal heart sounds. No murmur heard.    No friction rub. No gallop.  Pulmonary:     Effort: Pulmonary effort is normal. No respiratory distress.     Breath sounds: Normal breath sounds. No wheezing or rales.  Chest:     Chest wall: No tenderness.  Abdominal:     General: Bowel sounds are normal. There is no distension.     Palpations: Abdomen is soft. There is no mass.     Tenderness: There is no abdominal tenderness. There is no guarding or rebound.  Genitourinary:    Penis: Normal. No tenderness.      Testes: Normal.     Prostate: Normal.     Rectum: Normal. Guaiac result negative.  Musculoskeletal:        General: No tenderness. Normal range of motion.     Cervical back: Neck supple.  Lymphadenopathy:     Cervical: No cervical adenopathy.  Skin:    General: Skin  is warm and dry.     Coloration: Skin is not pale.     Findings: No erythema or rash.  Neurological:     General: No focal deficit present.     Mental Status: He is alert and oriented to person, place, and time.     Cranial Nerves: No cranial nerve deficit.     Motor: No abnormal muscle tone.     Coordination: Coordination normal.     Deep Tendon Reflexes: Reflexes are normal and symmetric. Reflexes normal.  Psychiatric:        Mood and Affect: Mood normal.        Behavior: Behavior normal.        Thought Content: Thought content normal.        Judgment: Judgment normal.           Assessment & Plan:  Well exam. We discussed diet and exercise. Get fasting labs. We will include a testosterone  level considering his ED issues. We will let him try Viagra for this. For the cramps, I suggested he take an OTC 400 mg magnesium  supplement daily. I congratulated him on being sober for 6 months. We will have him  return in 3 months to re-examine the tonsillar lesion.  Corita Diego, MD

## 2023-07-07 DIAGNOSIS — F331 Major depressive disorder, recurrent, moderate: Secondary | ICD-10-CM | POA: Diagnosis not present

## 2023-07-07 DIAGNOSIS — F1020 Alcohol dependence, uncomplicated: Secondary | ICD-10-CM | POA: Diagnosis not present

## 2023-07-08 ENCOUNTER — Ambulatory Visit: Payer: Self-pay | Admitting: Family Medicine

## 2023-07-10 NOTE — Progress Notes (Signed)
 Cardiology Office Note:    Date:  07/10/2023   ID:  Thomas Williamson, DOB 12-Dec-1975, MRN [id number]  PCP:  Donley Furth, MD  Cardiologist:  Jerryl Morin, DO  Electrophysiologist:  None   Referring MD: Donley Furth, MD    History of Present Illness:    Thomas Williamson is a 48 y.o. 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   Since his last visit with me he went to [place] . In the interim he had an echo which showed EF of 45% and an nuclear study with an infarction but no ischemia.   He also did follow with Consuela Denier in Jan 2025. No medications changes made at that time.  He is grateful that he has not relapsed since his rehab.   Past Medical History:  Diagnosis Date   Acetabulum fracture, left (HCC) 06/30/2022   Alcohol  use disorder, severe, in early remission (HCC) 03/30/2018   Allergy    Anxiety    Bipolar disorder, in partial remission, most recent episode hypomanic (HCC) 03/30/2018   sees Dr. Burnis [facility]   Bleeding internal hemorrhoids 09/12/2013   Cannabis dependence (HCC) 07/13/2018   Daily use   Cervical disc disease 11/20/2014   Chronic anxiety 07/13/2018   Chronic systolic (congestive) heart failure (HCC)    Complication of anesthesia    PONV   Depression    Dyslipidemia    GERD (gastroesophageal reflux disease)    Hernia, inguinal, bilateral 02/24/2011   High ankle sprain of right lower extremity 01/16/2015   History of hiatal hernia    Hx of hepatitis C 10/05/2017   -treated in 2019 -hepatology recommended no further surveillance needed except for LFTs with labs and see hepatologist if elevated   Hypertension    Lipids abnormal 09/29/2013   Pneumonia    PONV (postoperative nausea and  vomiting)    Vitamin D  deficiency 07/01/2022    Past Surgical History:  Procedure Laterality Date   ANTERIOR CRUCIATE LIGAMENT REPAIR  2010   BIOPSY  03/13/2019   Procedure: BIOPSY;  Surgeon: Ace Holder, MD;  Location: [facility];  Service: Gastroenterology;;   COLONOSCOPY N/A 03/13/2019   per Dr. General Kenner, adenomatous polyps, repeat in 3 yrs   COLONOSCOPY WITH PROPOFOL  N/A 02/11/2023   per Dr. General Kenner, adenomatous polyp, repeat in 5 yrs   INGUINAL HERNIA REPAIR  02/23/2012   Procedure: LAPAROSCOPIC BILATERAL INGUINAL HERNIA REPAIR;  Surgeon: Thayne Fine, MD;  Location: [facility];  Service: General;  Laterality: Bilateral;  Laparoscopic Bilateral Inguinal Hernia Repair with mesh   INSERTION OF MESH  02/23/2012   Procedure: INSERTION OF MESH;  Surgeon: Thayne Fine, MD;  Location: [facility];  Service: General;  Laterality: N/A;   NOSE SURGERY  [id number]   ORIF ACETABULAR FRACTURE Left 06/30/2022   Procedure: OPEN REDUCTION INTERNAL FIXATION (ORIF) ACETABULAR FRACTURE;  Surgeon: Hardy Lia, MD;  Location: [facility];  Service: Orthopedics;  Laterality: Left;   POLYPECTOMY  03/13/2019   Procedure: POLYPECTOMY;  Surgeon: Ace Holder, MD;  Location: [facility];  Service: Gastroenterology;;   POLYPECTOMY  02/11/2023   Procedure: POLYPECTOMY;  Surgeon: Ace Holder, MD;  Location: [facility];  Service: Gastroenterology;;    Current Medications: Current Meds  Medication Sig  amitriptyline (ELAVIL) 25 MG tablet Take 50 mg by mouth at bedtime.   ARIPiprazole (ABILIFY) 10 MG tablet Take 10 mg by mouth daily.   gabapentin  (NEURONTIN ) 600 MG tablet Take 900 mg by mouth 3 (three) times daily.   Omeprazole  20 MG TBEC Take 20 mg by mouth daily.    [DISCONTINUED] amLODipine  (NORVASC ) 10 MG tablet TAKE 1 TABLET BY MOUTH EVERY DAY   [DISCONTINUED] losartan -hydrochlorothiazide  (HYZAAR) 100-25 MG tablet TAKE 1 TABLET BY MOUTH EVERY DAY   [DISCONTINUED] metoprolol   succinate (TOPROL -XL) 25 MG 24 hr tablet Take 1 tablet (25 mg total) by mouth daily.   [DISCONTINUED] pravastatin  (PRAVACHOL ) 40 MG tablet TAKE 1 TABLET BY MOUTH EVERY DAY     Allergies:   Patient has no known allergies.   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  Tobacco Use   Smoking status: Some Days    Types: E-cigarettes   Smokeless tobacco: Never  Vaping Use   Vaping status: Every Day   Substances: CBD  Substance and Sexual Activity   Alcohol  use: Yes    Alcohol /week: 4.0 standard drinks of alcohol     Types: 4 Shots of liquor per week    Comment: (recovering alcoholic) occassioanl drink - drank one double last night, did not drink Sunday   Drug use: Yes    Frequency: 2.0 times per week    Types: Marijuana   Sexual activity: Yes    Birth control/protection: Condom  Other Topics Concern   Not on file  Social History Narrative   Work or School: [employer] at [employer], 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, 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      Home Situation: lives alone      Spiritual Beliefs:      Lifestyle:       Hx alcohol  abuse   Social Drivers of Corporate investment banker Strain: Low Risk  (06/12/2022)   Received from [facility]   Overall Financial Resource Strain (CARDIA)    Difficulty of Paying Living Expenses: Not very hard  Recent Concern: Financial Resource Strain - High Risk (04/06/2022)   [facility]   Overall Financial Resource Strain (CARDIA)    Difficulty of Paying Living Expenses: Very hard  Food Insecurity: No Food Insecurity (07/13/2022)   Hunger Vital Sign    Worried About Running Out of Food in the Last Year: Never true    Ran Out of Food in the Last Year: Never  true  Transportation Needs: No Transportation Needs (07/13/2022)   PRAPARE - Administrator, Civil Service (Medical): No    Lack of Transportation (Non-Medical): No  Physical Activity: Sufficiently Active (06/12/2022)   [facility], [facility]   Exercise Vital Sign    Days of Exercise per Week: 7 days    Minutes of Exercise per Session: 30 min  Stress: No Stress Concern Present (06/12/2022)   Received from [facility], [facility] - Occupational Stress Questionnaire    Feeling of Stress : Not at all  Social Connections: Socially Isolated (06/12/2022)   Received from  [facility], [facility]   Social Connection and Isolation Panel [NHANES]    Frequency of Communication with Friends and Family: More than three times a week    Frequency of Social Gatherings with Friends and Family: Twice a week    Attends Religious Services: Never    Database administrator or Organizations: No    Attends Engineer, structural: Never    Marital Status: Never married     Family History: The patient's family history includes Atrial fibrillation in his mother; Cancer in his father; Congestive Heart Failure in his paternal grandmother; Diabetes in his brother and father; Heart attack in his maternal grandfather; Hypertension in his father and mother; Learning disabilities in his paternal aunt; Leukemia (age of onset: 24) in his father; Lung cancer in his maternal grandmother; Other in his brother; Prostate cancer (age of onset: 26) in his father. There is no history of Colon cancer, Rectal cancer, or Stomach cancer.  ROS:   Review of Systems  Constitution: Negative for decreased appetite, fever and weight gain.  HENT: Negative for congestion, ear discharge, hoarse voice and sore throat.   Eyes: Negative for discharge, redness, vision loss in right eye and visual halos.  Cardiovascular: Negative for chest pain,  dyspnea on exertion, leg swelling, orthopnea and palpitations.  Respiratory: Negative for cough, hemoptysis, shortness of breath and snoring.   Endocrine: Negative for heat intolerance and polyphagia.  Hematologic/Lymphatic: Negative for bleeding problem. Does not bruise/bleed easily.  Skin: Negative for flushing, nail changes, rash and suspicious lesions.  Musculoskeletal: Negative for arthritis, joint pain, muscle cramps, myalgias, neck pain and stiffness.  Gastrointestinal: Negative for abdominal pain, bowel incontinence, diarrhea and excessive appetite.  Genitourinary: Negative for decreased libido, genital sores and incomplete emptying.  Neurological: Negative for brief paralysis, focal weakness, headaches and loss of balance.  Psychiatric/Behavioral: Negative for altered mental status, depression and suicidal ideas.  Allergic/Immunologic: Negative for HIV exposure and persistent infections.    EKGs/Labs/Other Studies Reviewed:    The following studies were reviewed today:   EKG:  The ekg ordered today demonstrates   Recent Labs: 07/01/2023: ALT 30; BUN 14; Creatinine, Ser 1.05; Magnesium  1.9; Potassium 4.1; Sodium 136 07/06/2023: Hemoglobin 11.3; Platelets 337.0; TSH 1.41  Recent Lipid Panel    Component Value Date/Time   CHOL 195 07/06/2023 1421   TRIG 398.0 (H) 07/06/2023 1421   HDL 43.20 07/06/2023 1421   CHOLHDL 5 07/06/2023 1421   VLDL 79.6 (H) 07/06/2023 1421   LDLCALC 72 07/06/2023 1421   LDLCALC 139 (H) 12/11/2019 1057   LDLDIRECT 175.0 03/10/2021 1625    Physical Exam:    VS:  BP 102/70 (BP Location: Left Arm, Patient Position: Sitting, Cuff Size: Normal)   Pulse 92   Ht 5\' 10"  (1.778 m)   Wt 200 lb (90.7 kg)   SpO2 99%   BMI 28.70 kg/m     Wt Readings from Last 3 Encounters:  07/06/23 205 lb (93 kg)  07/01/23 200 lb (90.7 kg)  02/26/23 199 lb (90.3 kg)     GEN: Well nourished, well developed in no acute distress HEENT: Normal NECK: No JVD; No carotid  bruits LYMPHATICS: No lymphadenopathy CARDIAC: S1S2 noted,RRR, no murmurs, rubs, gallops RESPIRATORY:  Clear to auscultation without rales, wheezing or rhonchi  ABDOMEN: Soft, non-tender, non-distended, +bowel sounds, no guarding. EXTREMITIES: No edema, No cyanosis, no clubbing MUSCULOSKELETAL:  No deformity  SKIN: Warm and dry NEUROLOGIC:  Alert and oriented x 3, non-focal PSYCHIATRIC:  Normal affect, good insight  ASSESSMENT:    1. Medication management   2. Depressed left ventricular ejection fraction     PLAN:    Heart failure with reduced ejection - clinically euvolemic.   Nonischemic cardiomyopathy - continue current medication regimen,. Need a repeat echo, will order that today    The patient is in agreement with the above plan. The patient left the office in stable condition.  The patient will follow up in   Medication Adjustments/Labs and Tests Ordered: Current medicines are reviewed at length with the patient today.  Concerns regarding medicines are outlined above.  Orders Placed This Encounter  Procedures   Comp Met (CMET)   Magnesium    ECHOCARDIOGRAM COMPLETE   No orders of the defined types were placed in this encounter.   Patient Instructions  Medication Instructions:  Your physician recommends that you continue on your current medications as directed. Please refer to the Current Medication list given to you today.  *If you need a refill on your cardiac medications before your next appointment, please call your pharmacy*  Lab Work: CMET, Mag If you have labs (blood work) drawn today and your tests are completely normal, you will receive your results only by: MyChart Message (if you have MyChart) OR A paper copy in the mail If you have any lab test that is abnormal or we need to change your treatment, we will call you to review the results.  Testing/Procedures: Your physician has requested that you have an echocardiogram. Echocardiography is a painless  test that uses sound waves to create images of your heart. It provides your doctor with information about the size and shape of your heart and how well your heart's chambers and valves are working. This procedure takes approximately one hour. There are no restrictions for this procedure. Please do NOT wear cologne, perfume, aftershave, or lotions (deodorant is allowed). Please arrive 15 minutes prior to your appointment time.  Please note: We ask at that you not bring children with you during ultrasound (echo/ vascular) testing. Due to room size and safety concerns, children are not allowed in the ultrasound rooms during exams. Our front office staff cannot provide observation of children in our lobby area while testing is being conducted. An adult accompanying a patient to their appointment will only be allowed in the ultrasound room at the discretion of the ultrasound technician under special circumstances. We apologize for any inconvenience.   Follow-Up: At [facility], you and your health needs are our priority.  As part of our continuing mission to provide you with exceptional heart care, our providers are all part of one team.  This team includes your primary Cardiologist (physician) and Advanced Practice Providers or APPs (Physician Assistants and Nurse Practitioners) who all work together to provide you with the care you need, when you need it.  Your next appointment:   6 month(s)  Provider:   Deniz Eskridge, DO     Adopting a Healthy Lifestyle.  Know what a healthy weight is for you (roughly BMI <25) and aim to maintain this   Aim for 7+ servings of fruits and vegetables daily   65-80+ fluid ounces of water or unsweet tea for healthy kidneys   Limit to max 1 drink of alcohol  per day; avoid smoking/tobacco   Limit animal fats in diet for cholesterol and heart health - choose grass fed whenever available   Avoid highly processed foods, and foods high in saturated/trans  fats   Aim  for low stress - take time to unwind and care for your mental health   Aim for 150 min of moderate intensity exercise weekly for heart health, and weights twice weekly for bone health   Aim for 7-9 hours of sleep daily   When it comes to diets, agreement about the perfect plan isnt easy to find, even among the experts. Experts at the [facility] [employer] developed an idea known as the Healthy Eating Plate. Just imagine a plate divided into logical, healthy portions.   The emphasis is on diet quality:   Load up on vegetables and fruits - one-half of your plate: Aim for color and variety, and remember that potatoes dont count.   Go for whole grains - one-quarter of your plate: Whole wheat, barley, wheat berries, quinoa, oats, brown rice, and foods made with them. If you want pasta, go with whole wheat pasta.   Protein power - one-quarter of your plate: Fish, chicken, beans, and nuts are all healthy, versatile protein sources. Limit red meat.   The diet, however, does go beyond the plate, offering a few other suggestions.   Use healthy plant oils, such as olive, canola, soy, corn, sunflower and peanut. Check the labels, and avoid partially hydrogenated oil, which have unhealthy trans fats.   If youre thirsty, drink water. Coffee and tea are good in moderation, but skip sugary drinks and limit milk and dairy products to one or two daily servings.   The type of carbohydrate in the diet is more important than the amount. Some sources of carbohydrates, such as vegetables, fruits, whole grains, and beans-are healthier than others.   Finally, stay active  Signed, Dinia Joynt, DO  07/10/2023 8:20 PM    [facility]

## 2023-07-13 MED ORDER — IRON (FERROUS SULFATE) 325 (65 FE) MG PO TABS
325.0000 mg | ORAL_TABLET | Freq: Every day | ORAL | 3 refills | Status: DC
Start: 2023-07-13 — End: 2023-10-19

## 2023-07-13 NOTE — Addendum Note (Signed)
 Addended by: Henry Loge on: 07/13/2023 09:01 AM   Modules accepted: Orders

## 2023-07-15 DIAGNOSIS — F411 Generalized anxiety disorder: Secondary | ICD-10-CM | POA: Diagnosis not present

## 2023-07-15 DIAGNOSIS — F1029 Alcohol dependence with unspecified alcohol-induced disorder: Secondary | ICD-10-CM | POA: Diagnosis not present

## 2023-07-15 DIAGNOSIS — F332 Major depressive disorder, recurrent severe without psychotic features: Secondary | ICD-10-CM | POA: Diagnosis not present

## 2023-07-21 DIAGNOSIS — F1020 Alcohol dependence, uncomplicated: Secondary | ICD-10-CM | POA: Diagnosis not present

## 2023-07-21 DIAGNOSIS — F331 Major depressive disorder, recurrent, moderate: Secondary | ICD-10-CM | POA: Diagnosis not present

## 2023-07-28 DIAGNOSIS — F1020 Alcohol dependence, uncomplicated: Secondary | ICD-10-CM | POA: Diagnosis not present

## 2023-07-28 DIAGNOSIS — F331 Major depressive disorder, recurrent, moderate: Secondary | ICD-10-CM | POA: Diagnosis not present

## 2023-08-04 DIAGNOSIS — F331 Major depressive disorder, recurrent, moderate: Secondary | ICD-10-CM | POA: Diagnosis not present

## 2023-08-04 DIAGNOSIS — F1020 Alcohol dependence, uncomplicated: Secondary | ICD-10-CM | POA: Diagnosis not present

## 2023-08-11 DIAGNOSIS — F331 Major depressive disorder, recurrent, moderate: Secondary | ICD-10-CM | POA: Diagnosis not present

## 2023-08-11 DIAGNOSIS — F1020 Alcohol dependence, uncomplicated: Secondary | ICD-10-CM | POA: Diagnosis not present

## 2023-08-18 DIAGNOSIS — F331 Major depressive disorder, recurrent, moderate: Secondary | ICD-10-CM | POA: Diagnosis not present

## 2023-08-18 DIAGNOSIS — F1020 Alcohol dependence, uncomplicated: Secondary | ICD-10-CM | POA: Diagnosis not present

## 2023-08-19 ENCOUNTER — Ambulatory Visit (HOSPITAL_COMMUNITY)
Admission: RE | Admit: 2023-08-19 | Discharge: 2023-08-19 | Disposition: A | Discharge status: Home / Self Care | Source: Ambulatory Visit | Attending: Cardiology | Admitting: Cardiology

## 2023-08-19 DIAGNOSIS — R931 Abnormal findings on diagnostic imaging of heart and coronary circulation: Secondary | ICD-10-CM | POA: Diagnosis not present

## 2023-08-19 LAB — ECHOCARDIOGRAM COMPLETE
Area-P 1/2: 4.44 cm2
S' Lateral: 4.6 cm

## 2023-08-19 MED ORDER — PERFLUTREN LIPID MICROSPHERE
1.0000 mL | INTRAVENOUS | Status: AC | PRN
  Administered 2023-08-19: 2 mL via INTRAVENOUS

## 2023-08-25 DIAGNOSIS — F1020 Alcohol dependence, uncomplicated: Secondary | ICD-10-CM | POA: Diagnosis not present

## 2023-08-25 DIAGNOSIS — F331 Major depressive disorder, recurrent, moderate: Secondary | ICD-10-CM | POA: Diagnosis not present

## 2023-09-01 DIAGNOSIS — F331 Major depressive disorder, recurrent, moderate: Secondary | ICD-10-CM | POA: Diagnosis not present

## 2023-09-01 DIAGNOSIS — F1020 Alcohol dependence, uncomplicated: Secondary | ICD-10-CM | POA: Diagnosis not present

## 2023-09-06 DIAGNOSIS — F1029 Alcohol dependence with unspecified alcohol-induced disorder: Secondary | ICD-10-CM | POA: Diagnosis not present

## 2023-09-06 DIAGNOSIS — F411 Generalized anxiety disorder: Secondary | ICD-10-CM | POA: Diagnosis not present

## 2023-09-06 DIAGNOSIS — F332 Major depressive disorder, recurrent severe without psychotic features: Secondary | ICD-10-CM | POA: Diagnosis not present

## 2023-09-08 ENCOUNTER — Other Ambulatory Visit (HOSPITAL_COMMUNITY): Payer: Self-pay

## 2023-09-08 ENCOUNTER — Ambulatory Visit: Attending: Cardiology | Admitting: Cardiology

## 2023-09-08 ENCOUNTER — Encounter: Payer: Self-pay | Admitting: Cardiology

## 2023-09-08 VITALS — BP 106/72 | HR 89 | Ht 69.0 in | Wt 206.6 lb

## 2023-09-08 DIAGNOSIS — I428 Other cardiomyopathies: Secondary | ICD-10-CM

## 2023-09-08 DIAGNOSIS — I5020 Unspecified systolic (congestive) heart failure: Secondary | ICD-10-CM | POA: Diagnosis not present

## 2023-09-08 DIAGNOSIS — Z79899 Other long term (current) drug therapy: Secondary | ICD-10-CM

## 2023-09-08 DIAGNOSIS — E782 Mixed hyperlipidemia: Secondary | ICD-10-CM | POA: Diagnosis not present

## 2023-09-08 DIAGNOSIS — F331 Major depressive disorder, recurrent, moderate: Secondary | ICD-10-CM | POA: Diagnosis not present

## 2023-09-08 DIAGNOSIS — F1020 Alcohol dependence, uncomplicated: Secondary | ICD-10-CM | POA: Diagnosis not present

## 2023-09-08 MED ORDER — DAPAGLIFLOZIN PROPANEDIOL 10 MG PO TABS
10.0000 mg | ORAL_TABLET | Freq: Every day | ORAL | 3 refills | Status: AC
  Filled 2023-09-08: qty 30, 30d supply, fill #0

## 2023-09-08 MED ORDER — SACUBITRIL-VALSARTAN 24-26 MG PO TABS
1.0000 | ORAL_TABLET | Freq: Two times a day (BID) | ORAL | 3 refills | Status: AC
  Filled 2023-09-08: qty 60, 30d supply, fill #0

## 2023-09-08 NOTE — Patient Instructions (Addendum)
 Medication Instructions:  Your physician has recommended you make the following change in your medication:  STOP: Amlodipine   STOP: Losartan -Hydrochlorothiazide  START: Farxiga  10 mg once daily START: Entresto  24-26 mg twice daily.  *If you need a refill on your cardiac medications before your next appointment, please call your pharmacy*  Lab Work: CMET, Mag If you have labs (blood work) drawn today and your tests are completely normal, you will receive your results only by: MyChart Message (if you have MyChart) OR A paper copy in the mail If you have any lab test that is abnormal or we need to change your treatment, we will call you to review the results.  Follow-Up: At [facility], you and your health needs are our priority.  As part of our continuing mission to provide you with exceptional heart care, our providers are all part of one team.  This team includes your primary Cardiologist (physician) and Advanced Practice Providers or APPs (Physician Assistants and Nurse Practitioners) who all work together to provide you with the care you need, when you need it.  Your next appointment:   6 week(s)  Provider:   Kardie Tobb, DO

## 2023-09-08 NOTE — Progress Notes (Signed)
 Cardiology Office Note:    Date:  09/08/2023   ID:  Thomas Williamson, DOB Jul 03, 1975, MRN [id number]  PCP:  Johnny Garnette LABOR, MD  Cardiologist:  Dub Huntsman, DO  Electrophysiologist:  None   Referring MD: Johnny Garnette LABOR, MD    History of Present Illness:    Thomas Williamson is a 48 y.o. 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   Of note as his last visit he had an echocardiogram in [place]  which showed EF of 45% with a nuclear study with evidence of infarction but no ischemia.  At his last visit with me which was in May 2025 an echocardiogram was ordered to reassess his EF.  He is here to discuss the echocardiogram result.   Past Medical History:  Diagnosis Date   Acetabulum fracture, left (HCC) 06/30/2022   Alcohol  use disorder, severe, in early remission (HCC) 03/30/2018   Allergy    Anxiety    Bipolar disorder, in partial remission, most recent episode hypomanic (HCC) 03/30/2018   [facility]   Bleeding internal hemorrhoids 09/12/2013   Cannabis dependence (HCC) 07/13/2018   Daily use   Cervical disc disease 11/20/2014   Chronic anxiety 07/13/2018   Chronic systolic (congestive) heart failure (HCC)    Complication of anesthesia    PONV   Depression    Dyslipidemia    GERD (gastroesophageal reflux disease)    Hernia, inguinal, bilateral 02/24/2011   High ankle sprain of right lower extremity 01/16/2015   History of hiatal hernia    Hx of hepatitis C 10/05/2017   -treated in 2019 -hepatology recommended no further surveillance needed except for LFTs with labs and see hepatologist if elevated   Hypertension    Lipids abnormal 09/29/2013   Pneumonia    PONV (postoperative nausea and vomiting)     Vitamin D  deficiency 07/01/2022    Past Surgical History:  Procedure Laterality Date   ANTERIOR CRUCIATE LIGAMENT REPAIR  2010   BIOPSY  03/13/2019   Procedure: BIOPSY;  Surgeon: Leigh Elspeth SQUIBB, MD;  Location: [facility];  Service: Gastroenterology;;   COLONOSCOPY N/A 03/13/2019   per Dr. Leigh, adenomatous polyps, repeat in 3 yrs   COLONOSCOPY WITH PROPOFOL  N/A 02/11/2023   per Dr. Leigh, adenomatous polyp, repeat in 5 yrs   INGUINAL HERNIA REPAIR  02/23/2012   Procedure: LAPAROSCOPIC BILATERAL INGUINAL HERNIA REPAIR;  Surgeon: Alm VEAR Angle, MD;  Location: [facility];  Service: General;  Laterality: Bilateral;  Laparoscopic Bilateral Inguinal Hernia Repair with mesh   INSERTION OF MESH  02/23/2012   Procedure: INSERTION OF MESH;  Surgeon: Alm VEAR Angle, MD;  Location: [facility];  Service: General;  Laterality: N/A;   NOSE SURGERY  [id number]   ORIF ACETABULAR FRACTURE Left 06/30/2022   Procedure: OPEN REDUCTION INTERNAL FIXATION (ORIF) ACETABULAR FRACTURE;  Surgeon: Celena Ozell, MD;  Location: [facility];  Service: Orthopedics;  Laterality: Left;   POLYPECTOMY  03/13/2019   Procedure: POLYPECTOMY;  Surgeon: Leigh Elspeth SQUIBB, MD;  Location: [facility];  Service: Gastroenterology;;   POLYPECTOMY  02/11/2023   Procedure: POLYPECTOMY;  Surgeon: Leigh Elspeth SQUIBB, MD;  Location: [facility];  Service: Gastroenterology;;    Current Medications: Current Meds  Medication Sig   amitriptyline (ELAVIL) 25 MG  tablet Take 50 mg by mouth at bedtime. (Patient taking differently: Take 50 mg by mouth as needed for sleep.)   ARIPiprazole (ABILIFY) 10 MG tablet Take 10 mg by mouth daily.   dapagliflozin  propanediol (FARXIGA ) 10 MG TABS tablet Take 1 tablet (10 mg total) by mouth daily before breakfast.   gabapentin  (NEURONTIN ) 600 MG tablet Take 900 mg by mouth 3 (three) times daily. (Patient taking differently: Take 900 mg by mouth 2 (two) times daily.)   Iron , Ferrous Sulfate ,  325 (65 Fe) MG TABS Take 325 mg by mouth daily.   metoprolol  succinate (TOPROL -XL) 25 MG 24 hr tablet Take 1 tablet (25 mg total) by mouth daily.   Omeprazole  20 MG TBEC Take 20 mg by mouth daily.    pravastatin  (PRAVACHOL ) 40 MG tablet Take 1 tablet (40 mg total) by mouth daily.   sacubitril -valsartan  (ENTRESTO ) 24-26 MG Take 1 tablet by mouth 2 (two) times daily.   sildenafil  (VIAGRA ) 100 MG tablet Take 1 tablet (100 mg total) by mouth daily as needed for erectile dysfunction.   [DISCONTINUED] amLODipine  (NORVASC ) 10 MG tablet Take 1 tablet (10 mg total) by mouth daily.   [DISCONTINUED] losartan -hydrochlorothiazide  (HYZAAR) 100-25 MG tablet Take 1 tablet by mouth daily.     Allergies:   Patient has no known allergies.   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  Tobacco Use   Smoking status: Some Days    Types: E-cigarettes   Smokeless tobacco: Never  Vaping Use   Vaping status: Every Day   Substances: CBD  Substance and Sexual Activity   Alcohol  use: Yes    Alcohol /week: 4.0 standard drinks of alcohol     Types: 4 Shots of liquor per week    Comment: (recovering alcoholic) occassioanl drink - drank one double last night, did not drink Sunday   Drug use: Yes    Frequency: 2.0 times per week    Types: Marijuana   Sexual activity: Yes    Birth control/protection: Condom  Other Topics Concern   Not on file  Social History Narrative   Work or School: [employer] at [employer], 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, 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      Home Situation: lives alone      Spiritual Beliefs:      Lifestyle:       Hx alcohol  abuse   Social Drivers of Corporate investment banker Strain: Low Risk  (06/12/2022)   Received from  [facility]   Overall Financial Resource Strain (CARDIA)    Difficulty of Paying Living Expenses: Not very hard  Recent Concern: Financial Resource Strain - High Risk (04/06/2022)   Received from [facility]   Overall Financial Resource Strain (CARDIA)    Difficulty of Paying Living Expenses: Very hard  Food Insecurity: No Food Insecurity (07/13/2022)   Hunger Vital Sign    Worried About Running Out of Food in the Last Year: Never true    Ran Out of Food in the Last Year: Never true  Transportation Needs: No Transportation Needs (07/13/2022)   PRAPARE - Administrator, Civil Service (Medical): No    Lack of Transportation (Non-Medical):  No  Physical Activity: Sufficiently Active (06/12/2022)   Received from [facility]   Exercise Vital Sign    On average, how many days per week do you engage in moderate to strenuous exercise (like a brisk walk)?: 7 days    On average, how many minutes do you engage in exercise at this level?: 30 min  Stress: No Stress Concern Present (06/12/2022)   Received from [facility] of Occupational [facility] - Occupational Stress Questionnaire    Feeling of Stress : Not at all  Social Connections: Socially Isolated (06/12/2022)   Received from [facility]   Social Connection and Isolation Panel    In a typical week, how many times do you talk on the phone with family, friends, or neighbors?: More than three times a week    How often do you get together with friends or relatives?: Twice a week    How often do you attend church or religious services?: Never    Do you belong to any clubs or organizations such as church groups, unions, fraternal or athletic groups, or school groups?: No    How often do you attend meetings of the clubs or organizations you belong to?: Never    Are you married, widowed, divorced, separated, never married, or living with a partner?: Never married     Family History: The patient's family  history includes Atrial fibrillation in his mother; Cancer in his father; Congestive Heart Failure in his paternal grandmother; Diabetes in his brother and father; Heart attack in his maternal grandfather; Hypertension in his father and mother; Learning disabilities in his paternal aunt; Leukemia (age of onset: 68) in his father; Lung cancer in his maternal grandmother; Other in his brother; Prostate cancer (age of onset: 89) in his father. There is no history of Colon cancer, Rectal cancer, or Stomach cancer.  ROS:   Review of Systems  Constitution: Negative for decreased appetite, fever and weight gain.  HENT: Negative for congestion, ear discharge, hoarse voice and sore throat.   Eyes: Negative for discharge, redness, vision loss in right eye and visual halos.  Cardiovascular: Negative for chest pain, dyspnea on exertion, leg swelling, orthopnea and palpitations.  Respiratory: Negative for cough, hemoptysis, shortness of breath and snoring.   Endocrine: Negative for heat intolerance and polyphagia.  Hematologic/Lymphatic: Negative for bleeding problem. Does not bruise/bleed easily.  Skin: Negative for flushing, nail changes, rash and suspicious lesions.  Musculoskeletal: Negative for arthritis, joint pain, muscle cramps, myalgias, neck pain and stiffness.  Gastrointestinal: Negative for abdominal pain, bowel incontinence, diarrhea and excessive appetite.  Genitourinary: Negative for decreased libido, genital sores and incomplete emptying.  Neurological: Negative for brief paralysis, focal weakness, headaches and loss of balance.  Psychiatric/Behavioral: Negative for altered mental status, depression and suicidal ideas.  Allergic/Immunologic: Negative for HIV exposure and persistent infections.    EKGs/Labs/Other Studies Reviewed:    The following studies were reviewed today:   EKG: None today  Recent Labs: 07/01/2023: ALT 30; BUN 14; Creatinine, Ser 1.05; Magnesium  1.9; Potassium 4.1;  Sodium 136 07/06/2023: Hemoglobin 11.3; Platelets 337.0; TSH 1.41  Recent Lipid Panel    Component Value Date/Time   CHOL 195 07/06/2023 1421   TRIG 398.0 (H) 07/06/2023 1421   HDL 43.20 07/06/2023 1421   CHOLHDL 5 07/06/2023 1421   VLDL 79.6 (H) 07/06/2023 1421   LDLCALC 72 07/06/2023 1421   LDLCALC 139 (H) 12/11/2019 1057   LDLDIRECT 175.0 03/10/2021 1625  Physical Exam:    VS:  BP 106/72   Pulse 89   Ht 5' 9 (1.753 m)   Wt 206 lb 9.6 oz (93.7 kg)   SpO2 96%   BMI 30.51 kg/m     Wt Readings from Last 3 Encounters:  09/08/23 206 lb 9.6 oz (93.7 kg)  07/06/23 205 lb (93 kg)  07/01/23 200 lb (90.7 kg)     GEN: Well nourished, well developed in no acute distress HEENT: Normal NECK: No JVD; No carotid bruits LYMPHATICS: No lymphadenopathy CARDIAC: S1S2 noted,RRR, no murmurs, rubs, gallops RESPIRATORY:  Clear to auscultation without rales, wheezing or rhonchi  ABDOMEN: Soft, non-tender, non-distended, +bowel sounds, no guarding. EXTREMITIES: No edema, No cyanosis, no clubbing MUSCULOSKELETAL:  No deformity  SKIN: Warm and dry NEUROLOGIC:  Alert and oriented x 3, non-focal PSYCHIATRIC:  Normal affect, good insight  ASSESSMENT:    1. HFrEF (heart failure with reduced ejection fraction) (HCC)   2. Nonischemic cardiomyopathy (HCC)   3. Mixed hyperlipidemia   4. Medication management      PLAN:    Heart failure with reduced ejection -most recent echocardiogram show EF 25 to 30% which is consistent with his echocardiogram which was done in August 2024.  In the interim there is a report from his stay in [place]  for EF of 45%.  With the new EF this seems to be a drop.  At this time I would like to optimize his guideline directed medical therapy.  Stop amlodipine  10 mg daily, stop losartan  and start Entresto  24-26 mg twice daily and Farxiga .  We would need to add Aldactone but I will wait for his follow-up visit   Clinically he does not appear to be  euvolemic  Hyperlipidemia - continue with current statin medication.  The patient is in agreement with the above plan. The patient left the office in stable condition.  The patient will follow up in 6 weeks or sooner if needed   Medication Adjustments/Labs and Tests Ordered: Current medicines are reviewed at length with the patient today.  Concerns regarding medicines are outlined above.  Orders Placed This Encounter  Procedures   Comp Met (CMET)   Magnesium    Meds ordered this encounter  Medications   sacubitril -valsartan  (ENTRESTO ) 24-26 MG    Sig: Take 1 tablet by mouth 2 (two) times daily.    Dispense:  180 tablet    Refill:  3    Starting   dapagliflozin  propanediol (FARXIGA ) 10 MG TABS tablet    Sig: Take 1 tablet (10 mg total) by mouth daily before breakfast.    Dispense:  90 tablet    Refill:  3    Patient Instructions  Medication Instructions:  Your physician has recommended you make the following change in your medication:  STOP: Amlodipine   STOP: Losartan -Hydrochlorothiazide  START: Farxiga  10 mg once daily START: Entresto  24-26 mg twice daily.  *If you need a refill on your cardiac medications before your next appointment, please call your pharmacy*  Lab Work: CMET, Mag If you have labs (blood work) drawn today and your tests are completely normal, you will receive your results only by: MyChart Message (if you have MyChart) OR A paper copy in the mail If you have any lab test that is abnormal or we need to change your treatment, we will call you to review the results.  Follow-Up: At [facility], you and your health needs are our priority.  As part of our continuing mission to provide you  with exceptional heart care, our providers are all part of one team.  This team includes your primary Cardiologist (physician) and Advanced Practice Providers or APPs (Physician Assistants and Nurse Practitioners) who all work together to provide you with the care you  need, when you need it.  Your next appointment:   6 week(s)  Provider:   Shavonte Zhao, DO      Adopting a Healthy Lifestyle.  Know what a healthy weight is for you (roughly BMI <25) and aim to maintain this   Aim for 7+ servings of fruits and vegetables daily   65-80+ fluid ounces of water or unsweet tea for healthy kidneys   Limit to max 1 drink of alcohol  per day; avoid smoking/tobacco   Limit animal fats in diet for cholesterol and heart health - choose grass fed whenever available   Avoid highly processed foods, and foods high in saturated/trans fats   Aim for low stress - take time to unwind and care for your mental health   Aim for 150 min of moderate intensity exercise weekly for heart health, and weights twice weekly for bone health   Aim for 7-9 hours of sleep daily   When it comes to diets, agreement about the perfect plan isnt easy to find, even among the experts. Experts at the [facility] [employer] developed an idea known as the Healthy Eating Plate. Just imagine a plate divided into logical, healthy portions.   The emphasis is on diet quality:   Load up on vegetables and fruits - one-half of your plate: Aim for color and variety, and remember that potatoes dont count.   Go for whole grains - one-quarter of your plate: Whole wheat, barley, wheat berries, quinoa, oats, brown rice, and foods made with them. If you want pasta, go with whole wheat pasta.   Protein power - one-quarter of your plate: Fish, chicken, beans, and nuts are all healthy, versatile protein sources. Limit red meat.   The diet, however, does go beyond the plate, offering a few other suggestions.   Use healthy plant oils, such as olive, canola, soy, corn, sunflower and peanut. Check the labels, and avoid partially hydrogenated oil, which have unhealthy trans fats.   If youre thirsty, drink water. Coffee and tea are good in moderation, but skip sugary drinks and limit milk and dairy  products to one or two daily servings.   The type of carbohydrate in the diet is more important than the amount. Some sources of carbohydrates, such as vegetables, fruits, whole grains, and beans-are healthier than others.   Finally, stay active  Signed, Brock Mokry, DO  09/08/2023 2:26 PM    [facility]

## 2023-09-09 ENCOUNTER — Ambulatory Visit: Payer: Self-pay | Admitting: Cardiology

## 2023-09-09 LAB — COMPREHENSIVE METABOLIC PANEL WITH GFR
ALT: 30 IU/L (ref 0–44)
AST: 19 IU/L (ref 0–40)
Albumin: 4.9 g/dL (ref 4.1–5.1)
Alkaline Phosphatase: 119 IU/L (ref 44–121)
BUN/Creatinine Ratio: 11 (ref 9–20)
BUN: 11 mg/dL (ref 6–24)
Bilirubin Total: 0.3 mg/dL (ref 0.0–1.2)
CO2: 24 mmol/L (ref 20–29)
Calcium: 10.8 mg/dL — ABNORMAL HIGH (ref 8.7–10.2)
Chloride: 98 mmol/L (ref 96–106)
Creatinine, Ser: 0.96 mg/dL (ref 0.76–1.27)
Globulin, Total: 2.6 g/dL (ref 1.5–4.5)
Glucose: 91 mg/dL (ref 70–99)
Potassium: 3.9 mmol/L (ref 3.5–5.2)
Sodium: 141 mmol/L (ref 134–144)
Total Protein: 7.5 g/dL (ref 6.0–8.5)
eGFR: 98 mL/min/1.73 (ref >59)

## 2023-09-09 LAB — MAGNESIUM: Magnesium: 2 mg/dL (ref 1.6–2.3)

## 2023-09-15 DIAGNOSIS — F1020 Alcohol dependence, uncomplicated: Secondary | ICD-10-CM | POA: Diagnosis not present

## 2023-09-15 DIAGNOSIS — F331 Major depressive disorder, recurrent, moderate: Secondary | ICD-10-CM | POA: Diagnosis not present

## 2023-09-22 DIAGNOSIS — F331 Major depressive disorder, recurrent, moderate: Secondary | ICD-10-CM | POA: Diagnosis not present

## 2023-09-22 DIAGNOSIS — F1020 Alcohol dependence, uncomplicated: Secondary | ICD-10-CM | POA: Diagnosis not present

## 2023-09-29 DIAGNOSIS — F331 Major depressive disorder, recurrent, moderate: Secondary | ICD-10-CM | POA: Diagnosis not present

## 2023-09-29 DIAGNOSIS — F1020 Alcohol dependence, uncomplicated: Secondary | ICD-10-CM | POA: Diagnosis not present

## 2023-10-10 ENCOUNTER — Emergency Department
Admission: EM | Admit: 2023-10-10 | Discharge: 2023-10-10 | Disposition: A | Discharge status: Home / Self Care | Attending: Emergency Medicine | Admitting: Emergency Medicine

## 2023-10-10 ENCOUNTER — Other Ambulatory Visit: Payer: Self-pay

## 2023-10-10 ENCOUNTER — Emergency Department

## 2023-10-10 DIAGNOSIS — D72829 Elevated white blood cell count, unspecified: Secondary | ICD-10-CM | POA: Insufficient documentation

## 2023-10-10 DIAGNOSIS — K449 Diaphragmatic hernia without obstruction or gangrene: Secondary | ICD-10-CM | POA: Diagnosis not present

## 2023-10-10 DIAGNOSIS — N281 Cyst of kidney, acquired: Secondary | ICD-10-CM | POA: Insufficient documentation

## 2023-10-10 DIAGNOSIS — R1032 Left lower quadrant pain: Secondary | ICD-10-CM

## 2023-10-10 DIAGNOSIS — K429 Umbilical hernia without obstruction or gangrene: Secondary | ICD-10-CM | POA: Diagnosis not present

## 2023-10-10 DIAGNOSIS — E876 Hypokalemia: Secondary | ICD-10-CM | POA: Diagnosis not present

## 2023-10-10 DIAGNOSIS — I509 Heart failure, unspecified: Secondary | ICD-10-CM | POA: Insufficient documentation

## 2023-10-10 DIAGNOSIS — R112 Nausea with vomiting, unspecified: Secondary | ICD-10-CM | POA: Diagnosis not present

## 2023-10-10 DIAGNOSIS — R935 Abnormal findings on diagnostic imaging of other abdominal regions, including retroperitoneum: Secondary | ICD-10-CM | POA: Diagnosis not present

## 2023-10-10 LAB — COMPREHENSIVE METABOLIC PANEL WITH GFR
ALT: 17 U/L (ref 0–44)
AST: 22 U/L (ref 15–41)
Albumin: 4.9 g/dL (ref 3.5–5.0)
Alkaline Phosphatase: 96 U/L (ref 38–126)
Anion gap: 16 — ABNORMAL HIGH (ref 5–15)
BUN: 18 mg/dL (ref 6–20)
CO2: 25 mmol/L (ref 22–32)
Calcium: 10.1 mg/dL (ref 8.9–10.3)
Chloride: 98 mmol/L (ref 98–111)
Creatinine, Ser: 1.04 mg/dL (ref 0.61–1.24)
GFR, Estimated: >60 mL/min (ref >60)
Glucose, Bld: 113 mg/dL — ABNORMAL HIGH (ref 70–99)
Potassium: 3.2 mmol/L — ABNORMAL LOW (ref 3.5–5.1)
Sodium: 139 mmol/L (ref 135–145)
Total Bilirubin: 0.9 mg/dL (ref 0.0–1.2)
Total Protein: 8.4 g/dL — ABNORMAL HIGH (ref 6.5–8.1)

## 2023-10-10 LAB — CBC
HCT: 43.6 % (ref 39.0–52.0)
Hemoglobin: 15.2 g/dL (ref 13.0–17.0)
MCH: 31 pg (ref 26.0–34.0)
MCHC: 34.9 g/dL (ref 30.0–36.0)
MCV: 89 fL (ref 80.0–100.0)
Platelets: 380 K/uL (ref 150–400)
RBC: 4.9 MIL/uL (ref 4.22–5.81)
RDW: 14.5 % (ref 11.5–15.5)
WBC: 12.5 K/uL — ABNORMAL HIGH (ref 4.0–10.5)
nRBC: 0 % (ref 0.0–0.2)

## 2023-10-10 LAB — LIPASE, BLOOD: Lipase: 27 U/L (ref 11–51)

## 2023-10-10 LAB — TROPONIN I (HIGH SENSITIVITY): Troponin I (High Sensitivity): 7 ng/L (ref <18)

## 2023-10-10 MED ORDER — MORPHINE SULFATE (PF) 4 MG/ML IV SOLN
4.0000 mg | Freq: Once | INTRAVENOUS | Status: AC
  Administered 2023-10-10: 4 mg via INTRAVENOUS
  Filled 2023-10-10 (×2): qty 1

## 2023-10-10 MED ORDER — SODIUM CHLORIDE 0.9 % IV BOLUS
500.0000 mL | Freq: Once | INTRAVENOUS | Status: AC
  Administered 2023-10-10: 500 mL via INTRAVENOUS

## 2023-10-10 MED ORDER — ONDANSETRON HCL 4 MG/2ML IJ SOLN
4.0000 mg | Freq: Once | INTRAMUSCULAR | Status: AC
  Administered 2023-10-10: 4 mg via INTRAVENOUS
  Filled 2023-10-10 (×2): qty 2

## 2023-10-10 MED ORDER — SODIUM CHLORIDE 0.9 % IV BOLUS: 1000.0000 mL | Freq: Once | INTRAVENOUS | Status: DC

## 2023-10-10 MED ORDER — ONDANSETRON HCL 4 MG PO TABS: 4.0000 mg | ORAL_TABLET | Freq: Four times a day (QID) | ORAL | 0 refills | Status: AC | PRN

## 2023-10-10 MED ORDER — IOHEXOL 300 MG/ML  SOLN
100.0000 mL | Freq: Once | INTRAMUSCULAR | Status: AC | PRN
  Administered 2023-10-10: 100 mL via INTRAVENOUS

## 2023-10-10 NOTE — ED Notes (Signed)
 Lab called for blood draw.

## 2023-10-10 NOTE — ED Provider Notes (Signed)
 [facility] Provider Note    Event Date/Time   First MD Initiated Contact with Patient 10/10/23 1402     (approximate)   History   Abdominal Pain   HPI  Thomas Williamson is a 48 year old male with history of dilated cardiomyopathy with EF of 25 to 30% presenting to the emergency department for evaluation of vomiting.  2 days ago patient began to have some discomfort in his left lower quadrant.  Since then he has had frequent episodes of nonbloody vomiting.  Has not had a bowel movement over the past few days.  Abdomen feels distended.  Is passing flatus.  History of prior abdominal hernia surgery.  Additionally reports onset of some chest pain that he thinks is related to dry heaving.  Denies dysuria urinary frequency.  Reviewed his heart failure visit from 09/08/2023.  At that time he was medically optimized by stopping amlodipine  and losartan  and starting Entresto  and Farxiga .    Physical Exam   Triage Vital Signs: ED Triage Vitals [10/10/23 1351]  Encounter Vitals Group     BP (!) 149/99     Girls Systolic BP Percentile      Girls Diastolic BP Percentile      Boys Systolic BP Percentile      Boys Diastolic BP Percentile      Pulse Rate (!) 105     Resp 18     Temp 98.2 F (36.8 C)     Temp Source Oral     SpO2 98 %     Weight      Height      Head Circumference      Peak Flow      Pain Score 6     Pain Loc      Pain Education      Exclude from Growth Chart     Most recent vital signs: Vitals:   10/10/23 1351  BP: (!) 149/99  Pulse: (!) 105  Resp: 18  Temp: 98.2 F (36.8 C)  SpO2: 98%     General: Awake, interactive  CV:  Tachycardic, good peripheral perfusion.  Resp:  Unlabored respirations, lungs clear to auscultation Abd:  Abdomen somewhat full, mild generalized tenderness without rebound or guarding Neuro:  Symmetric facial movement, fluid speech   ED Results / Procedures / Treatments   Labs (all labs ordered are  listed, but only abnormal results are displayed) Labs Reviewed  COMPREHENSIVE METABOLIC PANEL WITH GFR - Abnormal; Notable for the following components:      Result Value   Potassium 3.2 (*)    Glucose, Bld 113 (*)    Total Protein 8.4 (*)    Anion gap 16 (*)    All other components within normal limits  CBC - Abnormal; Notable for the following components:   WBC 12.5 (*)    All other components within normal limits  LIPASE, BLOOD  TROPONIN I (HIGH SENSITIVITY)     EKG EKG independently reviewed and interpreted by myself demonstrates:  EKG demonstrates normal sinus rhythm at a rate of 93, PR 160, QRS 156, QTc 524, left bundle branch block noted, no appreciable superimposed ischemic changes  RADIOLOGY Imaging independently reviewed and interpreted by myself demonstrates:  CT A/P without evidence of bowel obstruction  Formal Radiology Read:  CT ABDOMEN PELVIS W CONTRAST Result Date: 10/10/2023 CLINICAL DATA:  Left lower quadrant pain after eating, nausea and vomiting EXAM: CT ABDOMEN AND PELVIS WITH CONTRAST TECHNIQUE: Multidetector CT imaging of  the abdomen and pelvis was performed using the standard protocol following bolus administration of intravenous contrast. RADIATION DOSE REDUCTION: This exam was performed according to the departmental dose-optimization program which includes automated exposure control, adjustment of the mA and/or kV according to patient size and/or use of iterative reconstruction technique. CONTRAST:  OMNIPAQUE  IOHEXOL  300 MG/ML  SOLN COMPARISON:  08/29/2010 FINDINGS: Lower chest: No acute pleural or parenchymal lung disease. Small hiatal hernia. Hepatobiliary: No focal liver abnormality is seen. No gallstones, gallbladder wall thickening, or biliary dilatation. Pancreas: Unremarkable. No pancreatic ductal dilatation or surrounding inflammatory changes. Spleen: Normal in size without focal abnormality. Adrenals/Urinary Tract: The adrenals are unremarkable. No  urinary tract calculi or obstructive uropathy within either kidney. Indeterminate 1.6 cm hypodensity off the lower pole right kidney, measuring 68 HU, which may reflect a hyperdense cyst. Correlation with nonemergent renal ultrasound is recommended for further characterization. The bladder is unremarkable. Stomach/Bowel: No bowel obstruction or ileus. Normal appendix right lower quadrant. No bowel wall thickening or inflammatory change. Vascular/Lymphatic: Aortic atherosclerosis. No enlarged abdominal or pelvic lymph nodes. Reproductive: Prostate is unremarkable. Other: No free fluid or free intraperitoneal gas. Small fat containing umbilical hernia. No bowel herniation. Musculoskeletal: No acute or destructive bony abnormalities. Postsurgical changes from prior left acetabular fracture repair. Reconstructed images demonstrate no additional findings. IMPRESSION: 1. No acute intra-abdominal or intrapelvic process. No explanation for the patient's left lower quadrant pain. 2. Indeterminate 1.6 cm right renal hypodensity, possibly representing a hyperdense cyst. Nonemergent follow-up renal ultrasound is recommended for further characterization. 3.  Aortic Atherosclerosis (ICD10-I70.0). 4. Small hiatal hernia and small fat containing umbilical hernia. Electronically Signed   By: Ozell Daring M.D.   On: 10/10/2023 17:57    PROCEDURES:  Critical Care performed: No  Procedures   MEDICATIONS ORDERED IN ED: Medications  ondansetron  (ZOFRAN ) injection 4 mg (4 mg Intravenous Given 10/10/23 1742)  morphine  (PF) 4 MG/ML injection 4 mg (4 mg Intravenous Given 10/10/23 1742)  sodium chloride  0.9 % bolus 500 mL (500 mLs Intravenous New Bag/Given 10/10/23 1740)  iohexol  (OMNIPAQUE ) 300 MG/ML solution 100 mL (100 mLs Intravenous Contrast Given 10/10/23 1743)     IMPRESSION / MDM / ASSESSMENT AND PLAN / ED COURSE  I reviewed the triage vital signs and the nursing notes.  Differential diagnosis includes, but is not  limited to, SBO, diverticulitis, bowel obstruction, other acute intra-abdominal process, viral illness, dehydration, electrolyte abnormality, much lower suspicion ACS  Patient's presentation is most consistent with acute presentation with potential threat to life or bodily function.  48 year old male presenting to the emergency department for evaluation of abdominal pain and vomiting.  Mild tachycardia on presentation.  Labs with mild leukocytosis WC of 12.5, normal hemoglobin.  CMP with mild hypokalemia with K of 3.2.  Normal lipase.  Will obtain CT to further evaluate as well as add on troponin given report of chest pain though I have lower suspicion for primary cardiac process.  Troponin resulted within normal limits.  CT without acute abnormality.  A incidental renal cyst noted.  Patient updated on this finding.  Will follow-up with her primary care doctor.  On reevaluation, patient does report some improved symptoms.  Discussed results of workup and outpatient follow-up.  Patient comfortable this plan.  Strict return precautions provided.  Patient discharged in stable condition.      FINAL CLINICAL IMPRESSION(S) / ED DIAGNOSES   Final diagnoses:  Nausea and vomiting, unspecified vomiting type  Left lower quadrant abdominal pain  Renal cyst  Rx / DC Orders   ED Discharge Orders          Ordered    ondansetron  (ZOFRAN ) 4 MG tablet  Every 6 hours PRN        10/10/23 1830             Note:  This document was prepared using Dragon voice recognition software and may include unintentional dictation errors.   Levander Slate, MD 10/10/23 [phone]

## 2023-10-10 NOTE — Discharge Instructions (Addendum)
 You were seen in the ER today for evaluation of your vomiting abdominal pain.  Your testing fortunately did not show an emergency cause for this.  You may have a viral illness that is causing your abdominal discomfort and vomiting.  I sent a prescription for nausea medicine to your pharmacy that you can take as needed.  Follow with your primary care doctor for further evaluation.  Please also discuss the renal cyst that we saw on your CT scan as you will likely require an ultrasound to further evaluate this.  I have included information for follow-up with GI if your symptoms do not improve.  Return to the ER for new or worsening symptoms.

## 2023-10-10 NOTE — ED Notes (Signed)
 This nurse and Rosina, RN were unable to establish IV access on the pt. An IV team consult has been placed.

## 2023-10-10 NOTE — ED Triage Notes (Signed)
 Pt to ED via POV from home. Pt reports 2 days ago started to have discomfort in LLQ after eating. Pt reports pain has worsened after eating and now having N/V.

## 2023-10-13 ENCOUNTER — Other Ambulatory Visit (HOSPITAL_COMMUNITY): Payer: Self-pay

## 2023-10-13 DIAGNOSIS — F1020 Alcohol dependence, uncomplicated: Secondary | ICD-10-CM | POA: Diagnosis not present

## 2023-10-13 DIAGNOSIS — F331 Major depressive disorder, recurrent, moderate: Secondary | ICD-10-CM | POA: Diagnosis not present

## 2023-10-15 ENCOUNTER — Encounter (HOSPITAL_BASED_OUTPATIENT_CLINIC_OR_DEPARTMENT_OTHER): Payer: Self-pay | Admitting: *Deleted

## 2023-10-18 ENCOUNTER — Encounter: Payer: Self-pay | Admitting: *Deleted

## 2023-10-19 ENCOUNTER — Encounter: Payer: Self-pay | Admitting: Cardiology

## 2023-10-19 ENCOUNTER — Encounter: Payer: Self-pay | Admitting: Family Medicine

## 2023-10-19 ENCOUNTER — Ambulatory Visit: Attending: Cardiology | Admitting: Cardiology

## 2023-10-19 ENCOUNTER — Ambulatory Visit (INDEPENDENT_AMBULATORY_CARE_PROVIDER_SITE_OTHER): Admitting: Family Medicine

## 2023-10-19 VITALS — Ht 70.0 in | Wt 200.0 lb

## 2023-10-19 VITALS — BP 110/78 | HR 72 | Temp 97.8°F | Wt 195.0 lb

## 2023-10-19 DIAGNOSIS — R931 Abnormal findings on diagnostic imaging of heart and coronary circulation: Secondary | ICD-10-CM

## 2023-10-19 DIAGNOSIS — E782 Mixed hyperlipidemia: Secondary | ICD-10-CM

## 2023-10-19 DIAGNOSIS — A084 Viral intestinal infection, unspecified: Secondary | ICD-10-CM | POA: Diagnosis not present

## 2023-10-19 DIAGNOSIS — N281 Cyst of kidney, acquired: Secondary | ICD-10-CM

## 2023-10-19 DIAGNOSIS — I10 Essential (primary) hypertension: Secondary | ICD-10-CM

## 2023-10-19 DIAGNOSIS — Z79899 Other long term (current) drug therapy: Secondary | ICD-10-CM | POA: Diagnosis not present

## 2023-10-19 MED ORDER — IRON (FERROUS SULFATE) 325 (65 FE) MG PO TABS: 325.0000 mg | ORAL_TABLET | Freq: Every day | ORAL | 3 refills | Status: AC

## 2023-10-19 MED ORDER — SPIRONOLACTONE 25 MG PO TABS: 12.5000 mg | ORAL_TABLET | Freq: Every day | ORAL | 3 refills | Status: AC

## 2023-10-19 NOTE — Patient Instructions (Addendum)
 Medication Instructions:  Your physician has recommended you make the following change in your medication:   START: Aldactone  12.5 mg once daily  *If you need a refill on your cardiac medications before your next appointment, please call your pharmacy*  Lab Work: CMET, Mag  If you have labs (blood work) drawn today and your tests are completely normal, you will receive your results only by: MyChart Message (if you have MyChart) OR A paper copy in the mail If you have any lab test that is abnormal or we need to change your treatment, we will call you to review the results.  Testing/Procedures: Your physician has requested that you have an echocardiogram (2 days before next appt). Echocardiography is a painless test that uses sound waves to create images of your heart. It provides your doctor with information about the size and shape of your heart and how well your heart's chambers and valves are working. This procedure takes approximately one hour. There are no restrictions for this procedure. Please do NOT wear cologne, perfume, aftershave, or lotions (deodorant is allowed). Please arrive 15 minutes prior to your appointment time.  Please note: We ask at that you not bring children with you during ultrasound (echo/ vascular) testing. Due to room size and safety concerns, children are not allowed in the ultrasound rooms during exams. Our front office staff cannot provide observation of children in our lobby area while testing is being conducted. An adult accompanying a patient to their appointment will only be allowed in the ultrasound room at the discretion of the ultrasound technician under special circumstances. We apologize for any inconvenience.   Follow-Up: At [facility], you and your health needs are our priority.  As part of our continuing mission to provide you with exceptional heart care, our providers are all part of one team.  This team includes your primary Cardiologist  (physician) and Advanced Practice Providers or APPs (Physician Assistants and Nurse Practitioners) who all work together to provide you with the care you need, when you need it.  Your next appointment:   16-18 week(s) - Echo before this appointment   Provider:   Kardie Tobb, DO

## 2023-10-19 NOTE — Progress Notes (Unsigned)
 Cardiology Office Note:    Date:  10/22/2023   ID:  Thomas Williamson, DOB 12/05/1975, MRN [id number]  PCP:  Johnny Garnette LABOR, MD  Cardiologist:  Dub Huntsman, DO  Electrophysiologist:  None   Referring MD: Johnny Garnette LABOR, MD    History of Present Illness:    Thomas Williamson is a 48 y.o. 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   At his last visit we started to adjust his guideline directed medical therapy now on Entresto , farxiga  and toprol .   He has been able to tolerate the medications that has been started. The only complain is the fact that his blood pressure has been a bit elevated.     Past Medical History:  Diagnosis Date   Acetabulum fracture, left (HCC) 06/30/2022   Alcohol  use disorder, severe, in early remission (HCC) 03/30/2018   Allergy    Anxiety    Bipolar disorder, in partial remission, most recent episode hypomanic (HCC) 03/30/2018   [facility]   Bleeding internal hemorrhoids 09/12/2013   Cannabis dependence (HCC) 07/13/2018   Daily use   Cervical disc disease 11/20/2014   Chronic anxiety 07/13/2018   Chronic systolic (congestive) heart failure (HCC)    Complication of anesthesia    PONV   Depression    Dyslipidemia    GERD (gastroesophageal reflux disease)    Hernia, inguinal, bilateral 02/24/2011   High ankle sprain of right lower extremity 01/16/2015   History of hiatal hernia    Hx of hepatitis C 10/05/2017   -treated in 2019 -hepatology recommended no further surveillance needed except for LFTs with labs and see hepatologist if elevated   Hypertension    Lipids abnormal 09/29/2013   Pneumonia    PONV (postoperative nausea and vomiting)    Vitamin D  deficiency 07/01/2022     Past Surgical History:  Procedure Laterality Date   ANTERIOR CRUCIATE LIGAMENT REPAIR  2010   BIOPSY  03/13/2019   Procedure: BIOPSY;  Surgeon: Leigh Elspeth SQUIBB, MD;  Location: [facility];  Service: Gastroenterology;;   COLONOSCOPY N/A 03/13/2019   per Dr. Leigh, adenomatous polyps, repeat in 3 yrs   COLONOSCOPY WITH PROPOFOL  N/A 02/11/2023   per Dr. Leigh, adenomatous polyp, repeat in 5 yrs   INGUINAL HERNIA REPAIR  02/23/2012   Procedure: LAPAROSCOPIC BILATERAL INGUINAL HERNIA REPAIR;  Surgeon: Alm VEAR Angle, MD;  Location: [facility];  Service: General;  Laterality: Bilateral;  Laparoscopic Bilateral Inguinal Hernia Repair with mesh   INSERTION OF MESH  02/23/2012   Procedure: INSERTION OF MESH;  Surgeon: Alm VEAR Angle, MD;  Location: [facility];  Service: General;  Laterality: N/A;   NOSE SURGERY  [id number]   ORIF ACETABULAR FRACTURE Left 06/30/2022   Procedure: OPEN REDUCTION INTERNAL FIXATION (ORIF) ACETABULAR FRACTURE;  Surgeon: Celena Ozell, MD;  Location: [facility];  Service: Orthopedics;  Laterality: Left;   POLYPECTOMY  03/13/2019   Procedure: POLYPECTOMY;  Surgeon: Leigh Elspeth SQUIBB, MD;  Location: [facility];  Service: Gastroenterology;;   POLYPECTOMY  02/11/2023   Procedure: POLYPECTOMY;  Surgeon: Leigh Elspeth SQUIBB, MD;  Location: [facility];  Service: Gastroenterology;;    Current Medications: Current Meds  Medication Sig   amitriptyline (ELAVIL) 25 MG tablet Take 50 mg by mouth at  bedtime.   ARIPiprazole (ABILIFY) 10 MG tablet Take 10 mg by mouth daily.   dapagliflozin  propanediol (FARXIGA ) 10 MG TABS tablet Take 1 tablet (10 mg total) by mouth daily before breakfast.   gabapentin  (NEURONTIN ) 600 MG tablet Take 900 mg by mouth 3 (three) times daily. (Patient taking differently: Take 900 mg by mouth 2 (two) times daily.)   metoprolol  succinate (TOPROL -XL) 25 MG 24 hr tablet Take 1 tablet (25 mg total) by mouth daily.   Omeprazole  20 MG TBEC Take 20  mg by mouth daily.    pravastatin  (PRAVACHOL ) 40 MG tablet Take 1 tablet (40 mg total) by mouth daily.   sacubitril -valsartan  (ENTRESTO ) 24-26 MG Take 1 tablet by mouth 2 (two) times daily.   sildenafil  (VIAGRA ) 100 MG tablet Take 1 tablet (100 mg total) by mouth daily as needed for erectile dysfunction.   spironolactone  (ALDACTONE ) 25 MG tablet Take 0.5 tablets (12.5 mg total) by mouth daily.   [DISCONTINUED] Iron , Ferrous Sulfate , 325 (65 Fe) MG TABS Take 325 mg by mouth daily.     Allergies:   Patient has no known allergies.   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  Tobacco Use   Smoking status: Some Days    Types: E-cigarettes   Smokeless tobacco: Never  Vaping Use   Vaping status: Every Day   Substances: CBD  Substance and Sexual Activity   Alcohol  use: Yes    Alcohol /week: 4.0 standard drinks of alcohol     Types: 4 Shots of liquor per week    Comment: (recovering alcoholic) occassioanl drink - drank one double last night, did not drink Sunday   Drug use: Yes    Frequency: 2.0 times per week    Types: Marijuana   Sexual activity: Yes    Birth control/protection: Condom  Other Topics Concern   Not on file  Social History Narrative   Work or School: [employer] at [employer], 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, 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      Home Situation: lives alone      Spiritual Beliefs:      Lifestyle:       Hx alcohol  abuse   Social Drivers of Corporate investment banker Strain: Low Risk  (06/12/2022)   Received from [facility]   Overall Financial Resource Strain (CARDIA)    Difficulty of Paying Living Expenses: Not very hard  Recent Concern: Financial Resource Strain - High Risk (04/06/2022)   Received  from [facility]   Overall Financial Resource Strain (CARDIA)    Difficulty of Paying Living Expenses: Very hard  Food Insecurity: No Food Insecurity (07/13/2022)   Hunger Vital Sign    Worried About Running Out of Food in the Last Year: Never true    Ran Out of Food in the Last Year: Never true  Transportation Needs: No Transportation Needs (07/13/2022)   PRAPARE - Administrator, Civil Service (Medical): No    Lack of Transportation (Non-Medical): No  Physical Activity: Sufficiently Active (06/12/2022)   Received from [facility]   Exercise Vital Sign    On average, how many days per week do you engage in  moderate to strenuous exercise (like a brisk walk)?: 7 days    On average, how many minutes do you engage in exercise at this level?: 30 min  Stress: No Stress Concern Present (06/12/2022)   Received from [facility] - Occupational Stress Questionnaire    Feeling of Stress : Not at all  Social Connections: Socially Isolated (06/12/2022)   Received from [facility]   Social Connection and Isolation Panel    In a typical week, how many times do you talk on the phone with family, friends, or neighbors?: More than three times a week    How often do you get together with friends or relatives?: Twice a week    How often do you attend church or religious services?: Never    Do you belong to any clubs or organizations such as church groups, unions, fraternal or athletic groups, or school groups?: No    How often do you attend meetings of the clubs or organizations you belong to?: Never    Are you married, widowed, divorced, separated, never married, or living with a partner?: Never married     Family History: The patient's family history includes Atrial fibrillation in his mother; Cancer in his father; Congestive Heart Failure in his paternal grandmother; Diabetes in his brother and father; Heart attack in his maternal  grandfather; Hypertension in his father and mother; Learning disabilities in his paternal aunt; Leukemia (age of onset: 64) in his father; Lung cancer in his maternal grandmother; Other in his brother; Prostate cancer (age of onset: 36) in his father. There is no history of Colon cancer, Rectal cancer, or Stomach cancer.  ROS:   Review of Systems  Constitution: Negative for decreased appetite, fever and weight gain.  HENT: Negative for congestion, ear discharge, hoarse voice and sore throat.   Eyes: Negative for discharge, redness, vision loss in right eye and visual halos.  Cardiovascular: Negative for chest pain, dyspnea on exertion, leg swelling, orthopnea and palpitations.  Respiratory: Negative for cough, hemoptysis, shortness of breath and snoring.   Endocrine: Negative for heat intolerance and polyphagia.  Hematologic/Lymphatic: Negative for bleeding problem. Does not bruise/bleed easily.  Skin: Negative for flushing, nail changes, rash and suspicious lesions.  Musculoskeletal: Negative for arthritis, joint pain, muscle cramps, myalgias, neck pain and stiffness.  Gastrointestinal: Negative for abdominal pain, bowel incontinence, diarrhea and excessive appetite.  Genitourinary: Negative for decreased libido, genital sores and incomplete emptying.  Neurological: Negative for brief paralysis, focal weakness, headaches and loss of balance.  Psychiatric/Behavioral: Negative for altered mental status, depression and suicidal ideas.  Allergic/Immunologic: Negative for HIV exposure and persistent infections.    EKGs/Labs/Other Studies Reviewed:    The following studies were reviewed today:   EKG: None today  Recent Labs: 07/06/2023: TSH 1.41 10/10/2023: Hemoglobin 15.2; Platelets 380 10/19/2023: ALT 17; BUN 10; Creatinine, Ser 0.99; Magnesium  2.1; Potassium 4.8; Sodium 140  Recent Lipid Panel    Component Value Date/Time   CHOL 195 07/06/2023 1421   TRIG 398.0 (H) 07/06/2023 1421   HDL  43.20 07/06/2023 1421   CHOLHDL 5 07/06/2023 1421   VLDL 79.6 (H) 07/06/2023 1421   LDLCALC 72 07/06/2023 1421   LDLCALC 139 (H) 12/11/2019 1057   LDLDIRECT 175.0 03/10/2021 1625    Physical Exam:    VS:  Ht 5' 10 (1.778 m)   Wt 200 lb (90.7 kg)   BMI 28.70 kg/m     Wt Readings from  Last 3 Encounters:  10/19/23 195 lb (88.5 kg)  10/19/23 200 lb (90.7 kg)  09/08/23 206 lb 9.6 oz (93.7 kg)     GEN: Well nourished, well developed in no acute distress HEENT: Normal NECK: No JVD; No carotid bruits LYMPHATICS: No lymphadenopathy CARDIAC: S1S2 noted,RRR, no murmurs, rubs, gallops RESPIRATORY:  Clear to auscultation without rales, wheezing or rhonchi  ABDOMEN: Soft, non-tender, non-distended, +bowel sounds, no guarding. EXTREMITIES: No edema, No cyanosis, no clubbing MUSCULOSKELETAL:  No deformity  SKIN: Warm and dry NEUROLOGIC:  Alert and oriented x 3, non-focal PSYCHIATRIC:  Normal affect, good insight  ASSESSMENT:    1. Medication management   2. Depressed left ventricular ejection fraction   3. Primary hypertension   4. 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  He is currently on Farxiga  10 mg daily, Toprol  Xl 25 mg daily, and Entresto  24-26 mg BID.  He blood pressure elevated today  this is good because I will be able to add Aldactone  12.5 mg daily. Will repeat echo in 16 weeks.  I did share with him that if his EF does not improve as we optimize his medical therapy, I will also refer him to our advanced heart failure team and there is a possibility for defibrillator if heart function does not go higher than 35%.  Hypertension -blood pressure slightly elevated today we will be adding Aldactone  hopefully this will help improve it.  Follow-Up Follow-up scheduled to assess treatment effectiveness and make adjustments. - Schedule follow-up appointment in sixteen weeks. - Ensure echocardiogram is  done two days before the next appointment.  We would need to add Aldactone  but I will wait for his follow-up visit   Clinically he does not appear to be euvolemic  Hyperlipidemia - continue with current statin medication.  The patient is in agreement with the above plan. The patient left the office in stable condition.  The patient will follow up in 6 weeks or sooner if needed   Medication Adjustments/Labs and Tests Ordered: Current medicines are reviewed at length with the patient today.  Concerns regarding medicines are outlined above.  Orders Placed This Encounter  Procedures   Comp Met (CMET)   Magnesium    ECHOCARDIOGRAM COMPLETE   Meds ordered this encounter  Medications   spironolactone  (ALDACTONE ) 25 MG tablet    Sig: Take 0.5 tablets (12.5 mg total) by mouth daily.    Dispense:  45 tablet    Refill:  3    Patient Instructions  Medication Instructions:  Your physician has recommended you make the following change in your medication:   START: Aldactone  12.5 mg once daily  *If you need a refill on your cardiac medications before your next appointment, please call your pharmacy*  Lab Work: CMET, Mag  If you have labs (blood work) drawn today and your tests are completely normal, you will receive your results only by: MyChart Message (if you have MyChart) OR A paper copy in the mail If you have any lab test that is abnormal or we need to change your treatment, we will call you to review the results.  Testing/Procedures: Your physician has requested that you have an echocardiogram (2 days before next appt). Echocardiography is a painless test that uses sound waves to create images of your heart. It provides your doctor with information about the size and shape of your heart and how well your heart's chambers and valves are  working. This procedure takes approximately one hour. There are no restrictions for this procedure. Please do NOT wear cologne, perfume, aftershave,  or lotions (deodorant is allowed). Please arrive 15 minutes prior to your appointment time.  Please note: We ask at that you not bring children with you during ultrasound (echo/ vascular) testing. Due to room size and safety concerns, children are not allowed in the ultrasound rooms during exams. Our front office staff cannot provide observation of children in our lobby area while testing is being conducted. An adult accompanying a patient to their appointment will only be allowed in the ultrasound room at the discretion of the ultrasound technician under special circumstances. We apologize for any inconvenience.   Follow-Up: At [facility], you and your health needs are our priority.  As part of our continuing mission to provide you with exceptional heart care, our providers are all part of one team.  This team includes your primary Cardiologist (physician) and Advanced Practice Providers or APPs (Physician Assistants and Nurse Practitioners) who all work together to provide you with the care you need, when you need it.  Your next appointment:   16-18 week(s) - Echo before this appointment   Provider:   Mitchel Delduca, DO             Adopting a Healthy Lifestyle.  Know what a healthy weight is for you (roughly BMI <25) and aim to maintain this   Aim for 7+ servings of fruits and vegetables daily   65-80+ fluid ounces of water or unsweet tea for healthy kidneys   Limit to max 1 drink of alcohol  per day; avoid smoking/tobacco   Limit animal fats in diet for cholesterol and heart health - choose grass fed whenever available   Avoid highly processed foods, and foods high in saturated/trans fats   Aim for low stress - take time to unwind and care for your mental health   Aim for 150 min of moderate intensity exercise weekly for heart health, and weights twice weekly for bone health   Aim for 7-9 hours of sleep daily   When it comes to diets, agreement about the perfect plan  isnt easy to find, even among the experts. Experts at the [facility] of [employer] developed an idea known as the Healthy Eating Plate. Just imagine a plate divided into logical, healthy portions.   The emphasis is on diet quality:   Load up on vegetables and fruits - one-half of your plate: Aim for color and variety, and remember that potatoes dont count.   Go for whole grains - one-quarter of your plate: Whole wheat, barley, wheat berries, quinoa, oats, brown rice, and foods made with them. If you want pasta, go with whole wheat pasta.   Protein power - one-quarter of your plate: Fish, chicken, beans, and nuts are all healthy, versatile protein sources. Limit red meat.   The diet, however, does go beyond the plate, offering a few other suggestions.   Use healthy plant oils, such as olive, canola, soy, corn, sunflower and peanut. Check the labels, and avoid partially hydrogenated oil, which have unhealthy trans fats.   If youre thirsty, drink water. Coffee and tea are good in moderation, but skip sugary drinks and limit milk and dairy products to one or two daily servings.   The type of carbohydrate in the diet is more important than the amount. Some sources of carbohydrates, such as vegetables, fruits, whole grains, and beans-are healthier than others.  Finally, stay active  Signed, Dub Huntsman, DO  10/22/2023 8:29 AM    [facility]

## 2023-10-19 NOTE — Progress Notes (Signed)
   Subjective:    Patient ID: Thomas Williamson, male    DOB: 01-Jun-1975, 48 y.o.   MRN: [id number]  HPI Here to follow up on an ED visit on 10-10-23 when he presented with 2 days of vomiting and LLQ pains. No fever. He had not had a BM for 2 days. No urinary symptoms. His exam was normal except for some LLQ tenderness. Labs were remarkable for a WBC of 12.5, potassium 3.2, and normal lipase. An abdominal and pelvic CT was remarkable for a 1.6 cm hypodensity in the lower pole of the right kidney. No etiology of the pain was found. He was treated with IV fluids and Zofran . After he went home he quickly recovered, and he has felt fine for the past week. His appetite is normal.    Review of Systems  Constitutional: Negative.   Respiratory: Negative.    Cardiovascular: Negative.   Gastrointestinal: Negative.   Genitourinary: Negative.        Objective:   Physical Exam Constitutional:      Appearance: Normal appearance.  Cardiovascular:     Rate and Rhythm: Normal rate and regular rhythm.     Pulses: Normal pulses.     Heart sounds: Normal heart sounds.  Pulmonary:     Effort: Pulmonary effort is normal.     Breath sounds: Normal breath sounds.  Abdominal:     General: Abdomen is flat. Bowel sounds are normal. There is no distension.     Palpations: Abdomen is soft. There is no mass.     Tenderness: There is no abdominal tenderness. There is no right CVA tenderness, left CVA tenderness, guarding or rebound.     Hernia: No hernia is present.  Neurological:     Mental Status: He is alert.           Assessment & Plan:  He has recovered from what was likely a viral enteritis. We will further assess the right kidney lesion with a renal US . We spent a total of ( 32  ) minutes reviewing records and discussing these issues.  Garnette Olmsted, MD

## 2023-10-20 DIAGNOSIS — F1020 Alcohol dependence, uncomplicated: Secondary | ICD-10-CM | POA: Diagnosis not present

## 2023-10-20 LAB — COMPREHENSIVE METABOLIC PANEL WITH GFR
ALT: 17 IU/L (ref 0–44)
AST: 13 IU/L (ref 0–40)
Albumin: 4.7 g/dL (ref 4.1–5.1)
Alkaline Phosphatase: 115 IU/L (ref 47–123)
BUN/Creatinine Ratio: 10 (ref 9–20)
BUN: 10 mg/dL (ref 6–24)
Bilirubin Total: 0.3 mg/dL (ref 0.0–1.2)
CO2: 22 mmol/L (ref 20–29)
Calcium: 9.9 mg/dL (ref 8.7–10.2)
Chloride: 104 mmol/L (ref 96–106)
Creatinine, Ser: 0.99 mg/dL (ref 0.76–1.27)
Globulin, Total: 2.4 g/dL (ref 1.5–4.5)
Glucose: 77 mg/dL (ref 70–99)
Potassium: 4.8 mmol/L (ref 3.5–5.2)
Sodium: 140 mmol/L (ref 134–144)
Total Protein: 7.1 g/dL (ref 6.0–8.5)
eGFR: 94 mL/min/1.73 (ref >59)

## 2023-10-20 LAB — MAGNESIUM: Magnesium: 2.1 mg/dL (ref 1.6–2.3)

## 2023-10-22 ENCOUNTER — Ambulatory Visit: Payer: Self-pay | Admitting: Cardiology

## 2023-10-27 DIAGNOSIS — F1020 Alcohol dependence, uncomplicated: Secondary | ICD-10-CM | POA: Diagnosis not present

## 2023-10-27 DIAGNOSIS — S32462D Displaced associated transverse-posterior fracture of left acetabulum, subsequent encounter for fracture with routine healing: Secondary | ICD-10-CM | POA: Diagnosis not present

## 2023-10-27 DIAGNOSIS — S73015D Posterior dislocation of left hip, subsequent encounter: Secondary | ICD-10-CM | POA: Diagnosis not present

## 2023-10-27 DIAGNOSIS — F331 Major depressive disorder, recurrent, moderate: Secondary | ICD-10-CM | POA: Diagnosis not present

## 2023-11-02 DIAGNOSIS — F332 Major depressive disorder, recurrent severe without psychotic features: Secondary | ICD-10-CM | POA: Diagnosis not present

## 2023-11-02 DIAGNOSIS — F1029 Alcohol dependence with unspecified alcohol-induced disorder: Secondary | ICD-10-CM | POA: Diagnosis not present

## 2023-11-02 DIAGNOSIS — F411 Generalized anxiety disorder: Secondary | ICD-10-CM | POA: Diagnosis not present

## 2023-11-03 DIAGNOSIS — F1020 Alcohol dependence, uncomplicated: Secondary | ICD-10-CM | POA: Diagnosis not present

## 2023-11-09 NOTE — Telephone Encounter (Signed)
 Results/message from Dr. Emmette Harms has been released to MyChart. A letter is being sent to the last known home address.

## 2023-11-23 ENCOUNTER — Ambulatory Visit (HOSPITAL_BASED_OUTPATIENT_CLINIC_OR_DEPARTMENT_OTHER)
Admission: RE | Admit: 2023-11-23 | Discharge: 2023-11-23 | Disposition: A | Discharge status: Home / Self Care | Source: Ambulatory Visit | Attending: Family Medicine | Admitting: Family Medicine

## 2023-11-23 DIAGNOSIS — N2889 Other specified disorders of kidney and ureter: Secondary | ICD-10-CM | POA: Diagnosis not present

## 2023-11-23 DIAGNOSIS — N281 Cyst of kidney, acquired: Secondary | ICD-10-CM | POA: Insufficient documentation

## 2023-11-25 ENCOUNTER — Ambulatory Visit: Payer: Self-pay | Admitting: Family Medicine

## 2023-12-28 DIAGNOSIS — H5213 Myopia, bilateral: Secondary | ICD-10-CM | POA: Diagnosis not present

## 2023-12-28 DIAGNOSIS — F1029 Alcohol dependence with unspecified alcohol-induced disorder: Secondary | ICD-10-CM | POA: Diagnosis not present

## 2023-12-28 DIAGNOSIS — F411 Generalized anxiety disorder: Secondary | ICD-10-CM | POA: Diagnosis not present

## 2023-12-28 DIAGNOSIS — F332 Major depressive disorder, recurrent severe without psychotic features: Secondary | ICD-10-CM | POA: Diagnosis not present

## 2024-01-20 DIAGNOSIS — F411 Generalized anxiety disorder: Secondary | ICD-10-CM | POA: Diagnosis not present

## 2024-01-20 DIAGNOSIS — D105 Benign neoplasm of other parts of oropharynx: Secondary | ICD-10-CM | POA: Diagnosis not present

## 2024-01-20 DIAGNOSIS — F331 Major depressive disorder, recurrent, moderate: Secondary | ICD-10-CM | POA: Diagnosis not present

## 2024-01-24 ENCOUNTER — Ambulatory Visit (HOSPITAL_COMMUNITY)
Admission: RE | Admit: 2024-01-24 | Discharge: 2024-01-24 | Disposition: A | Discharge status: Home / Self Care | Source: Ambulatory Visit | Attending: Cardiology | Admitting: Cardiology

## 2024-01-24 DIAGNOSIS — R931 Abnormal findings on diagnostic imaging of heart and coronary circulation: Secondary | ICD-10-CM | POA: Insufficient documentation

## 2024-01-24 DIAGNOSIS — I5022 Chronic systolic (congestive) heart failure: Secondary | ICD-10-CM | POA: Diagnosis not present

## 2024-01-24 DIAGNOSIS — I10 Essential (primary) hypertension: Secondary | ICD-10-CM

## 2024-01-24 LAB — ECHOCARDIOGRAM COMPLETE
Area-P 1/2: 4.58 cm2
S' Lateral: 4.7 cm

## 2024-01-24 MED ORDER — PERFLUTREN LIPID MICROSPHERE
1.0000 mL | INTRAVENOUS | Status: AC | PRN
  Administered 2024-01-24: 2 mL via INTRAVENOUS

## 2024-01-26 ENCOUNTER — Ambulatory Visit: Admitting: Cardiology

## 2024-01-26 ENCOUNTER — Encounter: Payer: Self-pay | Admitting: Cardiology

## 2024-01-26 VITALS — BP 110/70 | HR 77 | Ht 70.0 in | Wt 195.0 lb

## 2024-01-26 DIAGNOSIS — R931 Abnormal findings on diagnostic imaging of heart and coronary circulation: Secondary | ICD-10-CM | POA: Diagnosis not present

## 2024-01-26 DIAGNOSIS — I509 Heart failure, unspecified: Secondary | ICD-10-CM

## 2024-01-26 DIAGNOSIS — Z79899 Other long term (current) drug therapy: Secondary | ICD-10-CM

## 2024-01-26 LAB — BASIC METABOLIC PANEL WITH GFR
BUN/Creatinine Ratio: 17 (ref 9–20)
BUN: 15 mg/dL (ref 6–24)
CO2: 21 mmol/L (ref 20–29)
Calcium: 9.7 mg/dL (ref 8.7–10.2)
Chloride: 103 mmol/L (ref 96–106)
Creatinine, Ser: 0.9 mg/dL (ref 0.76–1.27)
Glucose: 82 mg/dL (ref 70–99)
Potassium: 4.4 mmol/L (ref 3.5–5.2)
Sodium: 132 mmol/L — ABNORMAL LOW (ref 134–144)
eGFR: 105 mL/min/1.73

## 2024-01-26 LAB — HEPATIC FUNCTION PANEL
ALT: 20 IU/L (ref 0–44)
AST: 10 IU/L (ref 0–40)
Albumin: 4.4 g/dL (ref 4.1–5.1)
Alkaline Phosphatase: 132 IU/L — ABNORMAL HIGH (ref 47–123)
Bilirubin Total: 0.4 mg/dL (ref 0.0–1.2)
Bilirubin, Direct: 0.12 mg/dL (ref 0.00–0.40)
Total Protein: 6.7 g/dL (ref 6.0–8.5)

## 2024-01-26 LAB — MAGNESIUM: Magnesium: 2.2 mg/dL (ref 1.6–2.3)

## 2024-01-26 NOTE — Progress Notes (Signed)
 " Cardiology Office Note:    Date:  01/26/2024   ID:  Thomas Williamson, DOB [birth year], MRN [id number]  PCP:  Thomas Garnette LABOR, MD  Cardiologist:  Thomas Huntsman, DO  Electrophysiologist:  None   Referring MD: Thomas Garnette LABOR, MD    History of Present Illness:    Thomas Williamson is a 48 y.o. 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   At his last visit we started to adjust his guideline directed medical therapy now on Entresto , farxiga  and toprol .   He has been able to tolerate the medications that has been started. The only complain is the fact that his blood pressure has been a bit elevated.     Past Medical History:  Diagnosis Date   Acetabulum fracture, left (HCC) 06/30/2022   Alcohol  use disorder, severe, in early remission (HCC) 03/30/2018   Allergy    Anxiety    Bipolar disorder, in partial remission, most recent episode hypomanic (HCC) 03/30/2018   [facility]   Bleeding internal hemorrhoids 09/12/2013   Cannabis dependence (HCC) 07/13/2018   Daily use   Cervical disc disease 11/20/2014   Chronic anxiety 07/13/2018   Chronic systolic (congestive) heart failure (HCC)    Complication of anesthesia    PONV   Depression    Dyslipidemia    GERD (gastroesophageal reflux disease)    Hernia, inguinal, bilateral 02/24/2011   High ankle sprain of right lower extremity 01/16/2015   History of hiatal hernia    Hx of hepatitis C 10/05/2017   -treated in 2019 -hepatology recommended no further surveillance needed except for LFTs with labs and see hepatologist if elevated   Hypertension    Lipids abnormal 09/29/2013   Pneumonia    PONV (postoperative nausea and vomiting)    Vitamin D  deficiency 07/01/2022     Past Surgical History:  Procedure Laterality Date   ANTERIOR CRUCIATE LIGAMENT REPAIR  2010   BIOPSY  03/13/2019   Procedure: BIOPSY;  Surgeon: Thomas Elspeth SQUIBB, MD;  Location: [facility];  Service: Gastroenterology;;   COLONOSCOPY N/A 03/13/2019   per Dr. Leigh, adenomatous polyps, repeat in 3 yrs   COLONOSCOPY WITH PROPOFOL  N/A 02/11/2023   per Dr. Leigh, adenomatous polyp, repeat in 5 yrs   INGUINAL HERNIA REPAIR  02/23/2012   Procedure: LAPAROSCOPIC BILATERAL INGUINAL HERNIA REPAIR;  Surgeon: Thomas VEAR Angle, MD;  Location: [facility];  Service: General;  Laterality: Bilateral;  Laparoscopic Bilateral Inguinal Hernia Repair with mesh   INSERTION OF MESH  02/23/2012   Procedure: INSERTION OF MESH;  Surgeon: Thomas VEAR Angle, MD;  Location: [facility];  Service: General;  Laterality: N/A;   NOSE SURGERY  [id number]   ORIF ACETABULAR FRACTURE Left 06/30/2022   Procedure: OPEN REDUCTION INTERNAL FIXATION (ORIF) ACETABULAR FRACTURE;  Surgeon: Thomas Ozell, MD;  Location: [facility];  Service: Orthopedics;  Laterality: Left;   POLYPECTOMY  03/13/2019   Procedure: POLYPECTOMY;  Surgeon: Thomas Elspeth SQUIBB, MD;  Location: [facility];  Service: Gastroenterology;;   POLYPECTOMY  02/11/2023   Procedure: POLYPECTOMY;  Surgeon: Thomas Elspeth SQUIBB, MD;  Location: [facility];  Service: Gastroenterology;;    Current Medications: Current Meds  Medication Sig   amitriptyline (ELAVIL) 25 MG tablet Take 50 mg by mouth  at bedtime. (Patient taking differently: Take 50 mg by mouth at bedtime. PRN)   ARIPiprazole (ABILIFY) 10 MG tablet Take 10 mg by mouth daily.   dapagliflozin  propanediol (FARXIGA ) 10 MG TABS tablet Take 1 tablet (10 mg total) by mouth daily before breakfast.   gabapentin  (NEURONTIN ) 600 MG tablet Take 900 mg by mouth 3 (three) times daily. (Patient taking differently: Take 900 mg by mouth 2 (two) times daily.)   Iron , Ferrous Sulfate , 325 (65 Fe) MG TABS Take 325 mg by  mouth daily.   metoprolol  succinate (TOPROL -XL) 25 MG 24 hr tablet Take 1 tablet (25 mg total) by mouth daily.   Omeprazole 20 MG TBEC Take 20 mg by mouth daily.    pravastatin  (PRAVACHOL ) 40 MG tablet Take 1 tablet (40 mg total) by mouth daily.   sacubitril -valsartan  (ENTRESTO ) 24-26 MG Take 1 tablet by mouth 2 (two) times daily.   sildenafil  (VIAGRA ) 100 MG tablet Take 1 tablet (100 mg total) by mouth daily as needed for erectile dysfunction.   spironolactone  (ALDACTONE ) 25 MG tablet Take 0.5 tablets (12.5 mg total) by mouth daily.     Allergies:   Patient has no known allergies.   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  Tobacco Use   Smoking status: Some Days    Types: E-cigarettes   Smokeless tobacco: Never  Vaping Use   Vaping status: Every Thomas   Substances: CBD  Substance and Sexual Activity   Alcohol  use: Yes    Alcohol /week: 4.0 standard drinks of alcohol     Types: 4 Shots of liquor per week    Comment: (recovering alcoholic) occassioanl drink - drank one double last night, did not drink Sunday   Drug use: Yes    Frequency: 2.0 times per week    Types: Marijuana   Sexual activity: Yes    Birth control/protection: Condom  Other Topics Concern   Not on file  Social History Narrative   Work or School: [employer], 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, 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      Home Situation: lives alone      Spiritual Beliefs:      Lifestyle:       Hx alcohol  abuse   Social Drivers of Health   Tobacco Use: High Risk (01/26/2024)   Patient History    Smoking Tobacco Use: Some Days    Smokeless Tobacco Use: Never    Passive Exposure: Not on file  Financial Resource Strain: Low Risk (06/12/2022)    Received from [facility]   Overall Financial Resource Strain (CARDIA)    Difficulty of Paying Living Expenses: Not very hard  Recent Concern: Financial Resource Strain - High Risk (04/06/2022)   Received from [facility]   Overall Financial Resource Strain (CARDIA)    Difficulty of Paying Living Expenses: Very hard  Food Insecurity: Low Risk (01/20/2024)   Received from [facility]   Epic    Within the past 12 months, you worried that your food would run out before you got money to buy more: Never true    Within the past 12 months, the food you bought just didn't last and you  didn't have money to get more. : Never true  Transportation Needs: No Transportation Needs (01/20/2024)   Received from [facility]    In the past 12 months, has lack of reliable transportation kept you from medical appointments, meetings, work or from getting things needed for daily living? : No  Physical Activity: Sufficiently Active (06/12/2022)   Received from [facility]   Exercise Vital Sign    On average, how many days per week do you engage in moderate to strenuous exercise (like a brisk walk)?: 7 days    On average, how many minutes do you engage in exercise at this level?: 30 min  Stress: No Stress Concern Present (06/12/2022)   Received from [facility] - Occupational Stress Questionnaire    Feeling of Stress : Not at all  Social Connections: Socially Isolated (06/12/2022)   Received from [facility]   Social Connection and Isolation Panel    In a typical week, how many times do you talk on the phone with family, friends, or neighbors?: More than three times a week    How often do you get together with friends or relatives?: Twice a week    How often do you attend church or religious services?: Never    Do you belong to any clubs or organizations such as church groups, unions, fraternal or athletic groups, or school  groups?: No    How often do you attend meetings of the clubs or organizations you belong to?: Never    Are you married, widowed, divorced, separated, never married, or living with a partner?: Never married  Depression (PHQ2-9): Medium Risk (07/06/2023)   Depression (PHQ2-9)    PHQ-2 Score: 9  Alcohol  Screen: Not on file  Housing: Low Risk (01/20/2024)   Received from [facility]   Epic    What is your living situation today?: I have a steady place to live    Think about the place you live. Do you have problems with any of the following? Choose all that apply:: None/None on this list  Utilities: Low Risk (01/20/2024)   Received from [facility]   Utilities    In the past 12 months has the electric, gas, oil, or water company threatened to shut off services in your home? : No  Health Literacy: Low Risk (06/12/2022)   Received from [facility] Literacy    How often do you need to have someone help you when you read instructions, pamphlets, or other written material from your doctor or pharmacy?: Never     Family History: The patient's family history includes Atrial fibrillation in his mother; Cancer in his father; Congestive Heart Failure in his paternal grandmother; Diabetes in his brother and father; Heart attack in his maternal grandfather; Hypertension in his father and mother; Learning disabilities in his paternal aunt; Leukemia (age of onset: 50) in his father; Lung cancer in his maternal grandmother; Other in his brother; Prostate cancer (age of onset: 66) in his father. There is no history of Colon cancer, Rectal cancer, or Stomach cancer.  ROS:   Review of Systems  Constitution: Negative for decreased appetite, fever and weight gain.  HENT: Negative for congestion, ear discharge, hoarse voice and sore throat.   Eyes: Negative for discharge, redness, vision loss in right eye and visual halos.  Cardiovascular: Negative for chest pain, dyspnea on exertion, leg swelling,  orthopnea and palpitations.  Respiratory: Negative for cough, hemoptysis, shortness of breath and snoring.   Endocrine: Negative for heat intolerance and polyphagia.  Hematologic/Lymphatic: Negative for bleeding problem. Does not bruise/bleed easily.  Skin: Negative for flushing, nail changes, rash and suspicious lesions.  Musculoskeletal: Negative for arthritis, joint pain, muscle cramps, myalgias, neck pain and stiffness.  Gastrointestinal: Negative for abdominal pain, bowel incontinence, diarrhea and excessive appetite.  Genitourinary: Negative for decreased libido, genital sores and incomplete emptying.  Neurological: Negative for brief paralysis, focal weakness, headaches and loss of balance.  Psychiatric/Behavioral: Negative for altered mental status, depression and suicidal ideas.  Allergic/Immunologic: Negative for HIV exposure and persistent infections.    EKGs/Labs/Other Studies Reviewed:    The following studies were reviewed today:   EKG: None today  Recent Labs: 07/06/2023: TSH 1.41 10/10/2023: Hemoglobin 15.2; Platelets 380 10/19/2023: ALT 17; BUN 10; Creatinine, Ser 0.99; Magnesium  2.1; Potassium 4.8; Sodium 140  Recent Lipid Panel    Component Value Date/Time   CHOL 195 07/06/2023 1421   TRIG 398.0 (H) 07/06/2023 1421   HDL 43.20 07/06/2023 1421   CHOLHDL 5 07/06/2023 1421   VLDL 79.6 (H) 07/06/2023 1421   LDLCALC 72 07/06/2023 1421   LDLCALC 139 (H) 12/11/2019 1057   LDLDIRECT 175.0 03/10/2021 1625    Physical Exam:    VS:  BP 110/70   Pulse 77   Ht 5' 10 (1.778 m)   Wt 195 lb (88.5 kg)   SpO2 95%   BMI 27.98 kg/m     Wt Readings from Last 3 Encounters:  01/26/24 195 lb (88.5 kg)  10/19/23 195 lb (88.5 kg)  10/19/23 200 lb (90.7 kg)     GEN: Well nourished, well developed in no acute distress HEENT: Normal NECK: No JVD; No carotid bruits LYMPHATICS: No lymphadenopathy CARDIAC: S1S2 noted,RRR, no murmurs, rubs, gallops RESPIRATORY:  Clear to  auscultation without rales, wheezing or rhonchi  ABDOMEN: Soft, non-tender, non-distended, +bowel sounds, no guarding. EXTREMITIES: No edema, No cyanosis, no clubbing MUSCULOSKELETAL:  No deformity  SKIN: Warm and dry NEUROLOGIC:  Alert and oriented x 3, non-focal PSYCHIATRIC:  Normal affect, good insight  ASSESSMENT:    1. Depressed left ventricular ejection fraction   2. Medication management   3. 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 Repeat echo still at EF ot 20-25%.  He is currently on Farxiga  10 mg daily, Toprol  Xl 25 mg daily, Aldactone  12.5 mg daily and Entresto  24-26 mg BID.  Blood pressure is marginal so unable to make adjustments to his GDMT.    I did share with him that he will benefit from our advanced heart failure team  evaluations and EP evaluation for the possibility for defibrillator if heart function does not go higher than 35% on next echo.   Hypertension -blood pressure at target  Clinically he does appear to be euvolemic  Hyperlipidemia - continue with current statin medication.  Will set him with cardiac rehab.  The patient is in agreement with the above plan. The patient left the office in stable condition.  The patient will follow up in 6 months or sooner if needed   Medication Adjustments/Labs and Tests Ordered: Current medicines are reviewed at length with the patient today.  Concerns regarding medicines are outlined above.  Orders Placed This Encounter  Procedures   Basic Metabolic Panel (BMET)   Magnesium    Hepatic function panel   AMB referral to  cardiac rehabilitation   Ambulatory referral to Cardiac Electrophysiology   AMB referral to [facility]   No orders of the defined types were placed in this encounter.   Patient Instructions  Medication Instructions:  Your physician recommends that you continue on your current medications as directed.  Please refer to the Current Medication list given to you today.  *If you need a refill on your cardiac medications before your next appointment, please call your pharmacy*  Lab Work: BMET, Mag, LFTs If you have labs (blood work) drawn today and your tests are completely normal, you will receive your results only by: MyChart Message (if you have MyChart) OR A paper copy in the mail If you have any lab test that is abnormal or we need to change your treatment, we will call you to review the results.  Follow-Up: At [facility], you and your health needs are our priority.  As part of our continuing mission to provide you with exceptional heart care, our providers are all part of one team.  This team includes your primary Cardiologist (physician) and Advanced Practice Providers or APPs (Physician Assistants and Nurse Practitioners) who all work together to provide you with the care you need, when you need it.  Your next appointment:   6 month(s)  Provider:   Saurabh Hettich, DO    Other Instructions Please made an appt with one of our EP providers.       Adopting a Healthy Lifestyle.  Know what a healthy weight is for you (roughly BMI <25) and aim to maintain this   Aim for 7+ servings of fruits and vegetables daily   65-80+ fluid ounces of water or unsweet tea for healthy kidneys   Limit to max 1 drink of alcohol  per Thomas; avoid smoking/tobacco   Limit animal fats in diet for cholesterol and heart health - choose grass fed whenever available   Avoid highly processed foods, and foods high in saturated/trans fats   Aim for low stress - take time to unwind and care for your mental health   Aim for 150 min of moderate intensity exercise weekly for heart health, and weights twice weekly for bone health   Aim for 7-9 hours of sleep daily   When it comes to diets, agreement about the perfect plan isnt easy to find, even among the experts. Experts at the [facility]  [employer] developed an idea known as the Healthy Eating Plate. Just imagine a plate divided into logical, healthy portions.   The emphasis is on diet quality:   Load up on vegetables and fruits - one-half of your plate: Aim for color and variety, and remember that potatoes dont count.   Go for whole grains - one-quarter of your plate: Whole wheat, barley, wheat berries, quinoa, oats, brown rice, and foods made with them. If you want pasta, go with whole wheat pasta.   Protein power - one-quarter of your plate: Fish, chicken, beans, and nuts are all healthy, versatile protein sources. Limit red meat.   The diet, however, does go beyond the plate, offering a few other suggestions.   Use healthy plant oils, such as olive, canola, soy, corn, sunflower and peanut. Check the labels, and avoid partially hydrogenated oil, which have unhealthy trans fats.   If youre thirsty, drink water. Coffee and tea are good in moderation, but skip sugary drinks and limit milk and dairy products to one or two daily servings.   The type of  carbohydrate in the diet is more important than the amount. Some sources of carbohydrates, such as vegetables, fruits, whole grains, and beans-are healthier than others.   Finally, stay active  Signed, Thomas Huntsman, DO  01/26/2024 8:37 PM    [facility] "

## 2024-01-26 NOTE — Patient Instructions (Signed)
 Medication Instructions:  Your physician recommends that you continue on your current medications as directed. Please refer to the Current Medication list given to you today.  *If you need a refill on your cardiac medications before your next appointment, please call your pharmacy*  Lab Work: BMET, Mag, LFTs If you have labs (blood work) drawn today and your tests are completely normal, you will receive your results only by: MyChart Message (if you have MyChart) OR A paper copy in the mail If you have any lab test that is abnormal or we need to change your treatment, we will call you to review the results.  Follow-Up: At [facility], you and your health needs are our priority.  As part of our continuing mission to provide you with exceptional heart care, our providers are all part of one team.  This team includes your primary Cardiologist (physician) and Advanced Practice Providers or APPs (Physician Assistants and Nurse Practitioners) who all work together to provide you with the care you need, when you need it.  Your next appointment:   6 month(s)  Provider:   Kardie Tobb, DO    Other Instructions Please made an appt with one of our EP providers.

## 2024-01-27 ENCOUNTER — Ambulatory Visit: Payer: Self-pay | Admitting: Cardiology

## 2024-01-31 ENCOUNTER — Encounter (HOSPITAL_COMMUNITY): Payer: Self-pay

## 2024-01-31 ENCOUNTER — Telehealth (HOSPITAL_COMMUNITY): Payer: Self-pay

## 2024-01-31 NOTE — Addendum Note (Signed)
 Addended by: MELIDA ROLIN HERO on: 01/31/2024 05:53 PM   Modules accepted: Orders

## 2024-01-31 NOTE — Telephone Encounter (Signed)
 Office referral received for Cardiac rehab. Attempted to call patient in regards to Cardiac Rehab - LM on VM   Sent letter

## 2024-02-01 ENCOUNTER — Telehealth (HOSPITAL_COMMUNITY): Payer: Self-pay

## 2024-02-01 NOTE — Telephone Encounter (Signed)
 NA

## 2024-02-01 NOTE — Telephone Encounter (Signed)
 Pt returned phone call and he is interested doing the cardiac rehab at [facility]. Will fax referral to [facility].

## 2024-02-02 ENCOUNTER — Telehealth: Payer: Self-pay | Admitting: Cardiology

## 2024-02-02 NOTE — Telephone Encounter (Signed)
 Dr. Sheena Lanius saw this patient on [id number]. Per protocol we request that you comment on his cardiac risk to proceed with biopsy of nasopharynx scheduled for 02/07/2024, since it has been less than 2 months since evaluated in the office. Please send your comment to P CV Pre-Op Pool.  Thank you, Thomas Satterfield DNP, ANP, AACC.

## 2024-02-02 NOTE — Telephone Encounter (Signed)
"  ° °  Pre-operative Risk Assessment    Patient Name: Thomas Williamson  DOB: 1975-07-24 MRN: [id number]   Date of last office visit: 01/26/24 Date of next office visit: TBD    Request for Surgical Clearance    Procedure:  biopsy of nasopharynx    Date of Surgery:  Clearance 02/07/24                                Surgeon:  Dr. Carlie Socks Group or Practice Name:  [facility]  Phone number:  [phone]  Fax number:  [phone]   Type of Clearance Requested:   - Medical  - Pharmacy:  Hold defer to cardiologist       Type of Anesthesia:  General    Additional requests/questions:    [name]   02/02/2024, 12:48 PM   "

## 2024-02-02 NOTE — Telephone Encounter (Addendum)
 Pre-op clearance verbally called into [facility] for surgical clearance.      ----- Message from Vaughan Ricker, MD sent at 01/20/2024  9:20 AM EST ----- Please schedule removal of oropharynx papilloma at [facility].  Set it up like a tonsillectomy.

## 2024-02-04 ENCOUNTER — Encounter (HOSPITAL_COMMUNITY): Payer: Self-pay | Admitting: Otolaryngology

## 2024-02-04 NOTE — Telephone Encounter (Signed)
 See notes from preop APP Miriam Shams, NP

## 2024-02-04 NOTE — Telephone Encounter (Signed)
 Patient is calling back to f/u on his clearance for procedure scheduled 02/07/24. Please advise

## 2024-02-04 NOTE — Progress Notes (Signed)
 Anesthesia Chart Review: Same day workup  49 year old male follows with cardiology for history of alcoholic dilated cardiomyopathy, left bundle branch block.  Echo 01/24/2024 showed LVEF 25 to 30%, global hypokinesis, grade 1 DD, normal RV, no significant valvular abnormalities, no significant change from prior study.  Nuclear stress 12/2022 showed large anteroapical infarct, no evidence of ischemia.  Last seen by Dr. Sheena on 01/26/2024.  Maintained on GDMT of Farxiga  10 mg daily, Toprol -XL 25 mg daily, Aldactone  12.5 mg daily and Entresto  24-26 mg twice daily.  Uptitration limited by blood pressure.  Dr. Sheena cleared patient for surgery and telephone encounter 02/04/2024 and stating, He is cleared from a CV standpoint. he is at acceptable risk for his procedure. He will need close monitoring of his vital signs during the procedure.  Other pertinent history includes someday smoker, PONV, bipolar disorder, hep C s/p treatment, GERD on PPI, hiatal hernia.  EKG 10/10/2023: NSR.  Rate 93.  Left bundle branch block.   TTE 01/24/2024:  1. Left ventricular ejection fraction, by estimation, is 25 to 30%. The  left ventricle has severely decreased function. The left ventricle  demonstrates global hypokinesis. There is mild concentric left ventricular  hypertrophy. Left ventricular diastolic   parameters are consistent with Grade I diastolic dysfunction (impaired  relaxation).   2. Right ventricular systolic function is normal. The right ventricular  size is mildly enlarged. Tricuspid regurgitation signal is inadequate for  assessing PA pressure.   3. The mitral valve is normal in structure. No evidence of mitral valve  regurgitation. No evidence of mitral stenosis.   4. The aortic valve is normal in structure. Aortic valve regurgitation is  not visualized. Aortic valve sclerosis is present, with no evidence of  aortic valve stenosis.   5. There is borderline dilatation of the aortic root, measuring 36  mm.   6. The inferior vena cava is normal in size with greater than 50%  respiratory variability, suggesting right atrial pressure of 3 mmHg.   Nuclear stress 12/23/2022: Impression: Large anteroapical infarct.  No evidence of ischemia.  Left ventricular ejection fraction 41%.     Lynwood Geofm RIGGERS [facility] Short Stay Center/Anesthesiology Phone [phone] 02/04/2024 4:18 PM

## 2024-02-04 NOTE — Telephone Encounter (Signed)
 Dr. Sheena   You saw this patient on 01/26/2024. Per protocol we request that you comment on his cardiac risk to proceed with biopsy of nasopharynx scheduled for 02/07/2024, since it has been less than 2 months since evaluated in the office. Please send your comment to P CV Pre-Op Pool.

## 2024-02-04 NOTE — Telephone Encounter (Signed)
"  ° °  Patient Name: Thomas Williamson  DOB: 07/23/1975 MRN: [id number]  Primary Cardiologist: Kardie Tobb, DO  Chart reviewed as part of pre-operative protocol coverage. Given past medical history and time since last visit, based on ACC/AHA guidelines, Thomas Williamson is at acceptable risk for the planned procedure without further cardiovascular testing.   Per Dr. Sheena: He is cleared from a CV standpoint. he is at acceptable risk for his procedure. He will need close monitoring of his vital signs during the procedure.  I will route this recommendation to the requesting party via Epic fax function and remove from pre-op pool.  Please call with questions.  Corrinna Karapetyan E Kierrah Kilbride, NP 02/04/2024, 4:05 PM  "

## 2024-02-04 NOTE — Telephone Encounter (Signed)
 Michelle from [facility] called to follow up on clearance. Patient is scheduled for 02/07/24 and Rosaline is needing clearance, if not the surgery will need to be reschedule. Please advise.

## 2024-02-04 NOTE — Progress Notes (Signed)
 Unable to reach patient via phone.  Left a detailed message on machine with instructions for DOS.  PCP - Dr Garnette Olmsted Cardiologist - Dub Tobb, DO (clearance on 02/04/24)  Chest x-ray - n/a EKG - 10/10/23 Stress Test - 09/05/18 ECHO - 01/24/24 Cardiac Cath - n/a  ICD Pacemaker/Loop - n/a  Sleep Study -  n/a  Diabetes - n/a  Aspirin & Blood Thinner Instructions:  n/a  NPO  Anesthesia review: Yes  Hold Farxiga  for 72 hours prior to procedure.  Last dose was on Thursday, 02/03/24.  STOP now taking any Aspirin (unless otherwise instructed by your surgeon), Aleve, Naproxen, Ibuprofen, Motrin, Advil, Goody's, BC's, all herbal medications, fish oil, and all vitamins.

## 2024-02-04 NOTE — Anesthesia Preprocedure Evaluation (Addendum)
 "                                  Anesthesia Evaluation  Patient identified by MRN, date of birth, ID band Patient awake    Reviewed: Allergy & Precautions, NPO status , Patient's Chart, lab work & pertinent test results  History of Anesthesia Complications (+) PONV and history of anesthetic complications  Airway Mallampati: I  TM Distance: >3 FB Neck ROM: Full    Dental  (+) Teeth Intact, Dental Advisory Given   Pulmonary Current Smoker   breath sounds clear to auscultation       Cardiovascular hypertension, Pt. on home beta blockers and Pt. on medications +CHF  + dysrhythmias  Rhythm:Regular Rate:Normal  Echo:  1. Left ventricular ejection fraction, by estimation, is 25 to 30%. The  left ventricle has severely decreased function. The left ventricle  demonstrates global hypokinesis. There is mild concentric left ventricular  hypertrophy. Left ventricular diastolic   parameters are consistent with Grade I diastolic dysfunction (impaired  relaxation).   2. Right ventricular systolic function is normal. The right ventricular  size is mildly enlarged. Tricuspid regurgitation signal is inadequate for  assessing PA pressure.   3. The mitral valve is normal in structure. No evidence of mitral valve  regurgitation. No evidence of mitral stenosis.   4. The aortic valve is normal in structure. Aortic valve regurgitation is  not visualized. Aortic valve sclerosis is present, with no evidence of  aortic valve stenosis.   5. There is borderline dilatation of the aortic root, measuring 36 mm.   6. The inferior vena cava is normal in size with greater than 50%  respiratory variability, suggesting right atrial pressure of 3 mmHg.     Neuro/Psych  PSYCHIATRIC DISORDERS Anxiety Depression Bipolar Disorder      GI/Hepatic hiatal hernia,GERD  Medicated,,(+) Hepatitis -, C  Endo/Other  negative endocrine ROS    Renal/GU negative Renal ROS     Musculoskeletal negative  musculoskeletal ROS (+)    Abdominal   Peds  Hematology negative hematology ROS (+)   Anesthesia Other Findings   Reproductive/Obstetrics                              Anesthesia Physical Anesthesia Plan  ASA: 4  Anesthesia Plan: General   Post-op Pain Management: Tylenol  PO (pre-op)* and Toradol  IV (intra-op)*   Induction: Intravenous  PONV Risk Score and Plan: 3 and Ondansetron , Dexamethasone  and Midazolam   Airway Management Planned: Oral ETT  Additional Equipment: None  Intra-op Plan:   Post-operative Plan: Extubation in OR  Informed Consent: I have reviewed the patients History and Physical, chart, labs and discussed the procedure including the risks, benefits and alternatives for the proposed anesthesia with the patient or authorized representative who has indicated his/her understanding and acceptance.     Dental advisory given  Plan Discussed with: CRNA  Anesthesia Plan Comments: (PAT note by Lynwood Hope, PA-C: 49 year old male follows with cardiology for history of alcoholic dilated cardiomyopathy, left bundle branch block.  Echo 01/24/2024 showed LVEF 25 to 30%, global hypokinesis, grade 1 DD, normal RV, no significant valvular abnormalities, no significant change from prior study.  Nuclear stress 12/2022 showed large anteroapical infarct, no evidence of ischemia.  Last seen by Dr. Sheena on 01/26/2024.  Maintained on GDMT of Farxiga  10 mg daily, Toprol -XL 25 mg daily,  Aldactone  12.5 mg daily and Entresto  24-26 mg twice daily.  Uptitration limited by blood pressure.  Dr. Sheena cleared patient for surgery and telephone encounter 02/04/2024 and stating, He is cleared from a CV standpoint. he is at acceptable risk for his procedure. He will need close monitoring of his vital signs during the procedure.  Other pertinent history includes someday smoker, PONV, bipolar disorder, hep C s/p treatment, GERD on PPI, hiatal hernia.  EKG 10/10/2023: NSR.   Rate 93.  Left bundle branch block.   TTE 01/24/2024: 1. Left ventricular ejection fraction, by estimation, is 25 to 30%. The  left ventricle has severely decreased function. The left ventricle  demonstrates global hypokinesis. There is mild concentric left ventricular  hypertrophy. Left ventricular diastolic  parameters are consistent with Grade I diastolic dysfunction (impaired  relaxation).  2. Right ventricular systolic function is normal. The right ventricular  size is mildly enlarged. Tricuspid regurgitation signal is inadequate for  assessing PA pressure.  3. The mitral valve is normal in structure. No evidence of mitral valve  regurgitation. No evidence of mitral stenosis.  4. The aortic valve is normal in structure. Aortic valve regurgitation is  not visualized. Aortic valve sclerosis is present, with no evidence of  aortic valve stenosis.  5. There is borderline dilatation of the aortic root, measuring 36 mm.  6. The inferior vena cava is normal in size with greater than 50%  respiratory variability, suggesting right atrial pressure of 3 mmHg.   Nuclear stress 12/23/2022: Impression: Large anteroapical infarct.  No evidence of ischemia.  Left ventricular ejection fraction 41%.  )         Anesthesia Quick Evaluation  "

## 2024-02-07 ENCOUNTER — Encounter (HOSPITAL_COMMUNITY): Admission: RE | Disposition: A | Payer: Self-pay | Source: Home / Self Care | Attending: Otolaryngology

## 2024-02-07 ENCOUNTER — Encounter (HOSPITAL_COMMUNITY): Payer: Self-pay | Admitting: Otolaryngology

## 2024-02-07 ENCOUNTER — Ambulatory Visit (HOSPITAL_COMMUNITY): Payer: Self-pay | Admitting: Certified Registered Nurse Anesthetist

## 2024-02-07 ENCOUNTER — Other Ambulatory Visit: Payer: Self-pay

## 2024-02-07 ENCOUNTER — Ambulatory Visit (HOSPITAL_COMMUNITY)
Admission: RE | Admit: 2024-02-07 | Discharge: 2024-02-07 | Disposition: A | Discharge status: Home / Self Care | Attending: Otolaryngology | Admitting: Otolaryngology

## 2024-02-07 ENCOUNTER — Encounter (HOSPITAL_COMMUNITY): Payer: Self-pay | Admitting: Certified Registered Nurse Anesthetist

## 2024-02-07 DIAGNOSIS — D105 Benign neoplasm of other parts of oropharynx: Secondary | ICD-10-CM | POA: Diagnosis present

## 2024-02-07 DIAGNOSIS — K219 Gastro-esophageal reflux disease without esophagitis: Secondary | ICD-10-CM | POA: Diagnosis not present

## 2024-02-07 DIAGNOSIS — I110 Hypertensive heart disease with heart failure: Secondary | ICD-10-CM | POA: Insufficient documentation

## 2024-02-07 DIAGNOSIS — D1039 Benign neoplasm of other parts of mouth: Secondary | ICD-10-CM | POA: Insufficient documentation

## 2024-02-07 DIAGNOSIS — I5022 Chronic systolic (congestive) heart failure: Secondary | ICD-10-CM | POA: Diagnosis not present

## 2024-02-07 DIAGNOSIS — B1920 Unspecified viral hepatitis C without hepatic coma: Secondary | ICD-10-CM | POA: Insufficient documentation

## 2024-02-07 DIAGNOSIS — F17200 Nicotine dependence, unspecified, uncomplicated: Secondary | ICD-10-CM | POA: Diagnosis not present

## 2024-02-07 HISTORY — PX: NASOPHARYNGEAL BIOPSY: SHX6488

## 2024-02-07 LAB — CBC
HCT: 44.6 % (ref 39.0–52.0)
Hemoglobin: 14.6 g/dL (ref 13.0–17.0)
MCH: 31.5 pg (ref 26.0–34.0)
MCHC: 32.7 g/dL (ref 30.0–36.0)
MCV: 96.1 fL (ref 80.0–100.0)
Platelets: 253 K/uL (ref 150–400)
RBC: 4.64 MIL/uL (ref 4.22–5.81)
RDW: 12.7 % (ref 11.5–15.5)
WBC: 7.7 K/uL (ref 4.0–10.5)
nRBC: 0 % (ref 0.0–0.2)

## 2024-02-07 MED ORDER — ORAL CARE MOUTH RINSE: 15.0000 mL | Freq: Once | OROMUCOSAL | Status: AC

## 2024-02-07 MED ORDER — OXYCODONE HCL 5 MG/5ML PO SOLN: 5.0000 mg | Freq: Once | ORAL | Status: DC | PRN

## 2024-02-07 MED ORDER — SUCCINYLCHOLINE CHLORIDE 200 MG/10ML IV SOSY
PREFILLED_SYRINGE | INTRAVENOUS | Status: AC
  Filled 2024-02-07: qty 10

## 2024-02-07 MED ORDER — ETOMIDATE 2 MG/ML IV SOLN
INTRAVENOUS | Status: AC
  Filled 2024-02-07: qty 10

## 2024-02-07 MED ORDER — ACETAMINOPHEN 10 MG/ML IV SOLN: 1000.0000 mg | Freq: Once | INTRAVENOUS | Status: DC | PRN

## 2024-02-07 MED ORDER — ETOMIDATE 2 MG/ML IV SOLN
INTRAVENOUS | Status: DC | PRN
  Administered 2024-02-07: 12 mg via INTRAVENOUS

## 2024-02-07 MED ORDER — LIDOCAINE-EPINEPHRINE 1 %-1:100000 IJ SOLN
INTRAMUSCULAR | Status: DC | PRN
  Administered 2024-02-07: 1 mL

## 2024-02-07 MED ORDER — ROCURONIUM BROMIDE 10 MG/ML (PF) SYRINGE
PREFILLED_SYRINGE | INTRAVENOUS | Status: AC
  Filled 2024-02-07: qty 10

## 2024-02-07 MED ORDER — KETOROLAC TROMETHAMINE 30 MG/ML IJ SOLN
INTRAMUSCULAR | Status: AC
  Filled 2024-02-07: qty 1

## 2024-02-07 MED ORDER — OXYCODONE HCL 5 MG PO TABS: 5.0000 mg | ORAL_TABLET | Freq: Once | ORAL | Status: DC | PRN

## 2024-02-07 MED ORDER — KETOROLAC TROMETHAMINE 30 MG/ML IJ SOLN
INTRAMUSCULAR | Status: DC | PRN
  Administered 2024-02-07: 30 mg via INTRAVENOUS

## 2024-02-07 MED ORDER — LACTATED RINGERS IV SOLN: INTRAVENOUS | Status: DC

## 2024-02-07 MED ORDER — DEXMEDETOMIDINE HCL IN NACL 80 MCG/20ML IV SOLN
INTRAVENOUS | Status: AC
  Filled 2024-02-07: qty 20

## 2024-02-07 MED ORDER — FENTANYL CITRATE (PF) 250 MCG/5ML IJ SOLN
INTRAMUSCULAR | Status: AC
  Filled 2024-02-07: qty 5

## 2024-02-07 MED ORDER — NOREPINEPHRINE 4 MG/250ML-% IV SOLN
INTRAVENOUS | Status: DC | PRN
  Administered 2024-02-07: 2 ug/min via INTRAVENOUS

## 2024-02-07 MED ORDER — PROPOFOL 10 MG/ML IV BOLUS
INTRAVENOUS | Status: AC
  Filled 2024-02-07: qty 20

## 2024-02-07 MED ORDER — SUCCINYLCHOLINE CHLORIDE 200 MG/10ML IV SOSY
PREFILLED_SYRINGE | INTRAVENOUS | Status: DC | PRN
  Administered 2024-02-07: 120 mg via INTRAVENOUS

## 2024-02-07 MED ORDER — PROPOFOL 10 MG/ML IV BOLUS
INTRAVENOUS | Status: DC | PRN
  Administered 2024-02-07: 20 mg via INTRAVENOUS

## 2024-02-07 MED ORDER — FENTANYL CITRATE (PF) 100 MCG/2ML IJ SOLN: 25.0000 ug | INTRAMUSCULAR | Status: DC | PRN

## 2024-02-07 MED ORDER — LIDOCAINE 2% (20 MG/ML) 5 ML SYRINGE
INTRAMUSCULAR | Status: AC
  Filled 2024-02-07: qty 5

## 2024-02-07 MED ORDER — ACETAMINOPHEN 500 MG PO TABS
1000.0000 mg | ORAL_TABLET | Freq: Once | ORAL | Status: AC
  Administered 2024-02-07: 1000 mg via ORAL
  Filled 2024-02-07: qty 2

## 2024-02-07 MED ORDER — ONDANSETRON HCL 4 MG/2ML IJ SOLN
INTRAMUSCULAR | Status: DC | PRN
  Administered 2024-02-07: 4 mg via INTRAVENOUS

## 2024-02-07 MED ORDER — DEXAMETHASONE SOD PHOSPHATE PF 10 MG/ML IJ SOLN
INTRAMUSCULAR | Status: DC | PRN
  Administered 2024-02-07: 10 mg via INTRAVENOUS

## 2024-02-07 MED ORDER — CHLORHEXIDINE GLUCONATE 0.12 % MT SOLN
15.0000 mL | Freq: Once | OROMUCOSAL | Status: AC
  Administered 2024-02-07: 15 mL via OROMUCOSAL
  Filled 2024-02-07: qty 15

## 2024-02-07 MED ORDER — DEXMEDETOMIDINE HCL IN NACL 80 MCG/20ML IV SOLN
INTRAVENOUS | Status: DC | PRN
  Administered 2024-02-07: 4 ug via INTRAVENOUS
  Administered 2024-02-07 (×2): 8 ug via INTRAVENOUS

## 2024-02-07 MED ORDER — MIDAZOLAM HCL (PF) 2 MG/2ML IJ SOLN
INTRAMUSCULAR | Status: DC | PRN
  Administered 2024-02-07: 2 mg via INTRAVENOUS

## 2024-02-07 MED ORDER — DROPERIDOL 2.5 MG/ML IJ SOLN: 0.6250 mg | Freq: Once | INTRAMUSCULAR | Status: DC | PRN

## 2024-02-07 MED ORDER — FENTANYL CITRATE (PF) 250 MCG/5ML IJ SOLN
INTRAMUSCULAR | Status: DC | PRN
  Administered 2024-02-07: 75 ug via INTRAVENOUS

## 2024-02-07 MED ORDER — LIDOCAINE-EPINEPHRINE 1 %-1:100000 IJ SOLN
INTRAMUSCULAR | Status: AC
  Filled 2024-02-07: qty 1

## 2024-02-07 MED ORDER — MIDAZOLAM HCL 2 MG/2ML IJ SOLN
INTRAMUSCULAR | Status: AC
  Filled 2024-02-07: qty 2

## 2024-02-07 MED ORDER — SCOPOLAMINE 1 MG/3DAYS TD PT72
1.0000 | MEDICATED_PATCH | TRANSDERMAL | Status: DC
  Administered 2024-02-07: 1 mg via TRANSDERMAL
  Filled 2024-02-07: qty 1

## 2024-02-07 MED ORDER — ONDANSETRON HCL 4 MG/2ML IJ SOLN
INTRAMUSCULAR | Status: AC
  Filled 2024-02-07: qty 2

## 2024-02-07 NOTE — Op Note (Signed)
 Preop diagnosis: Oropharynx papilloma Postop diagnosis: same Procedure: Excision oropharynx papilloma Surgeon: Carlie Anesth: General Compl: None Findings: Small papilloma on left tip of uvula and left posterior tonsillar pillar.  Each sent for pathology. Description:  After discussing risks, benefits, and alternatives, the patient was brought to the operative suite and placed on the operative table in the supine position.  Anesthesia was induced and the patient was intubated by the anesthesia team without difficulty.  The bed was turned 90 degrees from anesthesia and the eyes were taped closed.  A head wrap was placed around the patient's head and the oropharynx was exposed with a Crow-Matthews retractor that was placed in suspension on the Mayo stand.  The left tip of uvula papilloma was removed using a needle tip cautery and passed to pathology.  Likewise, the left posterior tonsillar pillar papilloma was removed and passed for pathology.  Bleeding was controlled with limited cautery and injection of both sides with 1% lidocaine  with 1:100,000 epinephrine .  The throat was irrigated and suctioned.  The Crow-Swiech retractor was taken out of suspension and removed from the patient's mouth.  He was then turned back to anesthesia for wake-up and was extubated and moved to the recovery room in stable condition.

## 2024-02-07 NOTE — Anesthesia Procedure Notes (Signed)
 Procedure Name: Intubation Date/Time: 02/07/2024 12:36 PM  Performed by: Lamar Lucie DASEN, CRNAPre-anesthesia Checklist: Patient identified, Emergency Drugs available, Suction available and Patient being monitored Patient Re-evaluated:Patient Re-evaluated prior to induction Oxygen Delivery Method: Circle system utilized Preoxygenation: Pre-oxygenation with 100% oxygen Induction Type: IV induction Ventilation: Mask ventilation without difficulty Laryngoscope Size: Mac and 4 Grade View: Grade I Tube type: Oral Tube size: 7.5 mm Number of attempts: 1 Airway Equipment and Method: Stylet Placement Confirmation: ETT inserted through vocal cords under direct vision, positive ETCO2 and breath sounds checked- equal and bilateral Secured at: 23 cm Tube secured with: Tape Dental Injury: Teeth and Oropharynx as per pre-operative assessment

## 2024-02-07 NOTE — Anesthesia Postprocedure Evaluation (Signed)
"   Anesthesia Post Note  Patient: Thomas Williamson  Procedure(s) Performed: BIOPSY, NASOPHARYNX     Patient location during evaluation: PACU Anesthesia Type: General Level of consciousness: awake and alert Pain management: pain level controlled Vital Signs Assessment: post-procedure vital signs reviewed and stable Respiratory status: spontaneous breathing, nonlabored ventilation, respiratory function stable and patient connected to nasal cannula oxygen Cardiovascular status: blood pressure returned to baseline and stable Postop Assessment: no apparent nausea or vomiting Anesthetic complications: no   No notable events documented.  Last Vitals:  Vitals:   02/07/24 1330 02/07/24 1345  BP: 110/75 107/69  Pulse: 65 69  Resp: 18 17  Temp:  36.6 C  SpO2: 97% 95%    Last Pain:  Vitals:   02/07/24 1305  TempSrc:   PainSc: 0-No pain                 [name]      "

## 2024-02-07 NOTE — H&P (Signed)
 Thomas Williamson is an 49 y.o. male.   Chief Complaint: Oropharynx papilloma HPI: 49 year old male with history of one oropharynx papilloma under observation recently noticed a second and has desire to have them removed.  Past Medical History:  Diagnosis Date   Acetabulum fracture, left (HCC) 06/30/2022   Alcohol  use disorder, severe, in early remission (HCC) 03/30/2018   Allergy    Anxiety    Bipolar disorder, in partial remission, most recent episode hypomanic (HCC) 03/30/2018   [facility] Dr[facility]nn at [facility]   Bleeding internal hemorrhoids 09/12/2013   Cannabis dependence (HCC) 07/13/2018   Daily use   Cervical disc disease 11/20/2014   Chronic anxiety 07/13/2018   Chronic systolic (congestive) heart failure (HCC)    Complication of anesthesia    PONV   Depression    Dyslipidemia    GERD (gastroesophageal reflux disease)    Hernia, inguinal, bilateral 02/24/2011   High ankle sprain of right lower extremity 01/16/2015   History of hiatal hernia    Hx of hepatitis C 10/05/2017   -treated in 2019 -hepatology recommended no further surveillance needed except for LFTs with labs and see hepatologist if elevated   Hypertension    Lipids abnormal 09/29/2013   Pneumonia    PONV (postoperative nausea and vomiting)    Vitamin D  deficiency 07/01/2022    Past Surgical History:  Procedure Laterality Date   ANTERIOR CRUCIATE LIGAMENT REPAIR  2010   BIOPSY  03/13/2019   Procedure: BIOPSY;  Surgeon: Leigh Elspeth SQUIBB, MD;  Location: [facility];  Service: Gastroenterology;;   COLONOSCOPY N/A 03/13/2019   per Dr. Leigh, adenomatous polyps, repeat in 3 yrs   COLONOSCOPY WITH PROPOFOL  N/A 02/11/2023   per Dr. Leigh, adenomatous polyp, repeat in 5 yrs   INGUINAL HERNIA REPAIR  02/23/2012   Procedure: LAPAROSCOPIC BILATERAL INGUINAL HERNIA REPAIR;  Surgeon: Alm VEAR Angle, MD;  Location: [facility];  Service: General;  Laterality: Bilateral;  Laparoscopic Bilateral  Inguinal Hernia Repair with mesh   INSERTION OF MESH  02/23/2012   Procedure: INSERTION OF MESH;  Surgeon: Alm VEAR Angle, MD;  Location: [facility];  Service: General;  Laterality: N/A;   NOSE SURGERY  [id number]   ORIF ACETABULAR FRACTURE Left 06/30/2022   Procedure: OPEN REDUCTION INTERNAL FIXATION (ORIF) ACETABULAR FRACTURE;  Surgeon: Celena Ozell, MD;  Location: [facility];  Service: Orthopedics;  Laterality: Left;   PELVIC FRACTURE SURGERY Left 06/2022   POLYPECTOMY  03/13/2019   Procedure: POLYPECTOMY;  Surgeon: Leigh Elspeth SQUIBB, MD;  Location: [facility];  Service: Gastroenterology;;   POLYPECTOMY  02/11/2023   Procedure: POLYPECTOMY;  Surgeon: Leigh Elspeth SQUIBB, MD;  Location: [facility];  Service: Gastroenterology;;    Family History  Problem Relation Age of Onset   Hypertension Mother    Atrial fibrillation Mother    Cancer Father    Hypertension Father    Leukemia Father 39   Prostate cancer Father 66   Diabetes Father    Diabetes Brother    Other Brother        growth on finger   Learning disabilities Paternal Aunt    Lung cancer Maternal Grandmother    Heart attack Maternal Grandfather    Congestive Heart Failure Paternal Grandmother    Colon cancer Neg Hx    Rectal cancer Neg Hx    Stomach cancer Neg Hx    Social History:  reports that he has been smoking e-cigarettes. He has never used smokeless tobacco. He reports current  alcohol  use of about 4.0 standard drinks of alcohol  per week. He reports current drug use. Frequency: 2.00 times per week. Drug: Marijuana.  Allergies: Allergies[1]  Medications Prior to Admission  Medication Sig Dispense Refill   amitriptyline (ELAVIL) 25 MG tablet Take 50 mg by mouth at bedtime. (Patient taking differently: Take 25 mg by mouth at bedtime. PRN)     ARIPiprazole (ABILIFY) 10 MG tablet Take 10 mg by mouth daily.     dapagliflozin  propanediol (FARXIGA ) 10 MG TABS tablet Take 1 tablet (10 mg total) by mouth daily before  breakfast. 90 tablet 3   gabapentin  (NEURONTIN ) 300 MG capsule Take 300 mg by mouth 2 (two) times daily.     Iron , Ferrous Sulfate , 325 (65 Fe) MG TABS Take 325 mg by mouth daily. (Patient taking differently: Take 325 mg by mouth at bedtime.) 90 tablet 3   metoprolol  succinate (TOPROL -XL) 25 MG 24 hr tablet Take 1 tablet (25 mg total) by mouth daily. (Patient taking differently: Take 25 mg by mouth every evening.) 90 tablet 3   Omeprazole 20 MG TBEC Take 20 mg by mouth daily.      pravastatin  (PRAVACHOL ) 40 MG tablet Take 1 tablet (40 mg total) by mouth daily. (Patient taking differently: Take 40 mg by mouth every evening.) 90 tablet 3   sacubitril -valsartan  (ENTRESTO ) 24-26 MG Take 1 tablet by mouth 2 (two) times daily. 180 tablet 3   spironolactone  (ALDACTONE ) 25 MG tablet Take 0.5 tablets (12.5 mg total) by mouth daily. 45 tablet 3   sildenafil  (VIAGRA ) 100 MG tablet Take 1 tablet (100 mg total) by mouth daily as needed for erectile dysfunction. (Patient not taking: Reported on 02/04/2024) 10 tablet 11    Results for orders placed or performed during the hospital encounter of 02/07/24 (from the past 48 hours)  CBC per protocol     Status: None   Collection Time: 02/07/24  8:50 AM  Result Value Ref Range   WBC 7.7 4.0 - 10.5 K/uL   RBC 4.64 4.22 - 5.81 MIL/uL   Hemoglobin 14.6 13.0 - 17.0 g/dL   HCT 55.3 60.9 - 47.9 %   MCV 96.1 80.0 - 100.0 fL   MCH 31.5 26.0 - 34.0 pg   MCHC 32.7 30.0 - 36.0 g/dL   RDW 87.2 88.4 - 84.4 %   Platelets 253 150 - 400 K/uL   nRBC 0.0 0.0 - 0.2 %    Comment: Performed at [facility], [address], [place], [place] [address]   No results found.  Review of Systems  All other systems reviewed and are negative.   Blood pressure 116/84, pulse 71, temperature 97.9 F (36.6 C), temperature source Oral, resp. rate 19, height 5' 10 (1.778 m), weight 86.2 kg, SpO2 99%. Physical Exam Constitutional:      Appearance: Normal appearance. He is normal  weight.  HENT:     Head: Normocephalic and atraumatic.     Right Ear: External ear normal.     Left Ear: External ear normal.     Nose: Nose normal.     Mouth/Throat:     Mouth: Mucous membranes are moist.     Comments: Papilloma on uvula and left tonsil. Eyes:     Extraocular Movements: Extraocular movements intact.     Conjunctiva/sclera: Conjunctivae normal.     Pupils: Pupils are equal, round, and reactive to light.  Cardiovascular:     Rate and Rhythm: Normal rate.  Pulmonary:  Effort: Pulmonary effort is normal.  Skin:    General: Skin is warm and dry.  Neurological:     General: No focal deficit present.     Mental Status: He is alert and oriented to person, place, and time.  Psychiatric:        Mood and Affect: Mood normal.        Behavior: Behavior normal.        Thought Content: Thought content normal.        Judgment: Judgment normal.      Assessment/Plan Oropharynx papilloma  To OR for excision of oropharynx papilloma.  Vaughan Ricker, MD 02/07/2024, 10:54 AM       [1] No Known Allergies

## 2024-02-07 NOTE — Transfer of Care (Signed)
 Immediate Anesthesia Transfer of Care Note  Patient: Thomas Williamson  Procedure(s) Performed: BIOPSY, NASOPHARYNX  Patient Location: PACU  Anesthesia Type:General  Level of Consciousness: awake, alert , and oriented  Airway & Oxygen Therapy: Patient Spontanous Breathing and Patient connected to face mask oxygen  Post-op Assessment: Report given to RN, Post -op Vital signs reviewed and stable, Patient moving all extremities X 4, and Patient able to stick tongue midline  Post vital signs: Reviewed and stable  Last Vitals:  Vitals Value Taken Time  BP 107/75 02/07/24 13:02  Temp 36.4 C 02/07/24 13:05  Pulse 77 02/07/24 13:06  Resp 16 02/07/24 13:06  SpO2 96 % 02/07/24 13:06  Vitals shown include unfiled device data.  Last Pain:  Vitals:   02/07/24 1305  TempSrc:   PainSc: 0-No pain         Complications: No notable events documented.

## 2024-02-08 ENCOUNTER — Encounter (HOSPITAL_COMMUNITY): Payer: Self-pay | Admitting: Otolaryngology

## 2024-02-08 LAB — SURGICAL PATHOLOGY

## 2024-02-23 ENCOUNTER — Other Ambulatory Visit: Payer: Self-pay

## 2024-02-23 ENCOUNTER — Encounter: Attending: Cardiology

## 2024-02-23 VITALS — Ht 70.5 in | Wt 188.0 lb

## 2024-02-23 DIAGNOSIS — I5022 Chronic systolic (congestive) heart failure: Secondary | ICD-10-CM | POA: Diagnosis present

## 2024-02-23 NOTE — Patient Instructions (Signed)
 Patient Instructions  Patient Details  Name: Thomas Williamson MRN: [id number] Date of Birth: 07/20/1975 Referring Provider:  Tobb, Kardie, DO  Below are your personal goals for exercise, nutrition, and risk factors. Our goal is to help you stay on track towards obtaining and maintaining these goals. We will be discussing your progress on these goals with you throughout the program.  Initial Exercise Prescription:  Initial Exercise Prescription - 02/23/24 1500       Date of Initial Exercise RX and Referring Provider   Date 02/23/24    Referring Provider Tobb, Kardie, DO      Oxygen   Maintain Oxygen Saturation 88% or higher      Treadmill   MPH 2.5    Grade 0    Minutes 15    METs 2.91      Bike   Level 1    Watts 50    Minutes 15    METs 4.34      NuStep   Level 4   T6   SPM 80    Minutes 15    METs 4.34      Elliptical   Level 1    Speed 3    Minutes 15    METs 4.34      REL-XR   Level 2    Speed 50    Minutes 15    METs 4.34      Rower   Level 4    Watts 20    Minutes 15    METs 4.34      Track   Laps 35    Minutes 15    METs 2.9      Prescription Details   Frequency (times per week) 3    Duration Progress to 30 minutes of continuous aerobic without signs/symptoms of physical distress      Intensity   THRR 40-80% of Max Heartrate 118-154    Ratings of Perceived Exertion 11-13    Perceived Dyspnea 0-4      Progression   Progression Continue to progress workloads to maintain intensity without signs/symptoms of physical distress.      Resistance Training   Training Prescription Yes    Weight 7 lb    Reps 10-15          Exercise Goals: Frequency: Be able to perform aerobic exercise two to three times per week in program working toward 2-5 days per week of home exercise.  Intensity: Work with a perceived exertion of 11 (fairly light) - 15 (hard) while following your exercise prescription.  We will make changes to your prescription  with you as you progress through the program.   Duration: Be able to do 30 to 45 minutes of continuous aerobic exercise in addition to a 5 minute warm-up and a 5 minute cool-down routine.   Nutrition Goals: Your personal nutrition goals will be established when you do your nutrition analysis with the dietician.  The following are general nutrition guidelines to follow: Cholesterol < 200mg /day Sodium < 1500mg /day Fiber: Men under 50 yrs - 38 grams per day  Personal Goals:  Personal Goals and Risk Factors at Admission - 02/23/24 1319       Core Components/Risk Factors/Patient Goals on Admission    Weight Management Yes;Weight Loss    Intervention Weight Management: Develop a combined nutrition and exercise program designed to reach desired caloric intake, while maintaining appropriate intake of nutrient and fiber, sodium and fats, and appropriate energy expenditure required  for the weight goal.;Weight Management: Provide education and appropriate resources to help participant work on and attain dietary goals.;Weight Management/Obesity: Establish reasonable short term and long term weight goals.    Admit Weight 188 lb (85.3 kg)    Goal Weight: Short Term 185 lb (83.9 kg)    Goal Weight: Long Term 180 lb (81.6 kg)    Expected Outcomes Short Term: Continue to assess and modify interventions until short term weight is achieved;Long Term: Adherence to nutrition and physical activity/exercise program aimed toward attainment of established weight goal;Weight Loss: Understanding of general recommendations for a balanced deficit meal plan, which promotes 1-2 lb weight loss per week and includes a negative energy balance of [phone] kcal/d;Understanding recommendations for meals to include 15-35% energy as protein, 25-35% energy from fat, 35-60% energy from carbohydrates, less than 200mg  of dietary cholesterol, 20-35 gm of total fiber daily;Understanding of distribution of calorie intake throughout the day  with the consumption of 4-5 meals/snacks    Tobacco Cessation Yes    Number of packs per day Vape    Intervention Assist the participant in steps to quit. Provide individualized education and counseling about committing to Tobacco Cessation, relapse prevention, and pharmacological support that can be provided by physician.;Education officer, environmental, assist with locating and accessing local/national Quit Smoking programs, and support quit date choice.    Expected Outcomes Short Term: Will quit all tobacco product use, adhering to prevention of relapse plan.;Short Term: Will demonstrate readiness to quit, by selecting a quit date.;Long Term: Complete abstinence from all tobacco products for at least 12 months from quit date.    Heart Failure Yes    Intervention Provide a combined exercise and nutrition program that is supplemented with education, support and counseling about heart failure. Directed toward relieving symptoms such as shortness of breath, decreased exercise tolerance, and extremity edema.    Expected Outcomes Improve functional capacity of life;Short term: Attendance in program 2-3 days a week with increased exercise capacity. Reported lower sodium intake. Reported increased fruit and vegetable intake. Reports medication compliance.;Short term: Daily weights obtained and reported for increase. Utilizing diuretic protocols set by physician.;Long term: Adoption of self-care skills and reduction of barriers for early signs and symptoms recognition and intervention leading to self-care maintenance.    Hypertension Yes    Intervention Provide education on lifestyle modifcations including regular physical activity/exercise, weight management, moderate sodium restriction and increased consumption of fresh fruit, vegetables, and low fat dairy, alcohol  moderation, and smoking cessation.;Monitor prescription use compliance.    Expected Outcomes Short Term: Continued assessment and intervention until  BP is < 140/67mm HG in hypertensive participants. < 130/5mm HG in hypertensive participants with diabetes, heart failure or chronic kidney disease.;Long Term: Maintenance of blood pressure at goal levels.    Lipids Yes    Intervention Provide education and support for participant on nutrition & aerobic/resistive exercise along with prescribed medications to achieve LDL 70mg , HDL >40mg .    Expected Outcomes Short Term: Participant states understanding of desired cholesterol values and is compliant with medications prescribed. Participant is following exercise prescription and nutrition guidelines.;Long Term: Cholesterol controlled with medications as prescribed, with individualized exercise RX and with personalized nutrition plan. Value goals: LDL < 70mg , HDL > 40 mg.          Tobacco Use Initial Evaluation: Social History   Tobacco Use  Smoking Status Some Days   Types: E-cigarettes  Smokeless Tobacco Never    Exercise Goals and Review:  Exercise Goals  Row Name 02/23/24 1544             Exercise Goals   Increase Physical Activity Yes       Intervention Provide advice, education, support and counseling about physical activity/exercise needs.;Develop an individualized exercise prescription for aerobic and resistive training based on initial evaluation findings, risk stratification, comorbidities and participant's personal goals.       Expected Outcomes Short Term: Attend rehab on a regular basis to increase amount of physical activity.;Long Term: Add in home exercise to make exercise part of routine and to increase amount of physical activity.;Long Term: Exercising regularly at least 3-5 days a week.       Increase Strength and Stamina Yes       Intervention Provide advice, education, support and counseling about physical activity/exercise needs.;Develop an individualized exercise prescription for aerobic and resistive training based on initial evaluation findings, risk  stratification, comorbidities and participant's personal goals.       Expected Outcomes Short Term: Increase workloads from initial exercise prescription for resistance, speed, and METs.;Short Term: Perform resistance training exercises routinely during rehab and add in resistance training at home;Long Term: Improve cardiorespiratory fitness, muscular endurance and strength as measured by increased METs and functional capacity ( )       Able to understand and use rate of perceived exertion (RPE) scale Yes       Intervention Provide education and explanation on how to use RPE scale       Expected Outcomes Short Term: Able to use RPE daily in rehab to express subjective intensity level;Long Term:  Able to use RPE to guide intensity level when exercising independently       Able to understand and use Dyspnea scale Yes       Intervention Provide education and explanation on how to use Dyspnea scale       Expected Outcomes Long Term: Able to use Dyspnea scale to guide intensity level when exercising independently;Short Term: Able to use Dyspnea scale daily in rehab to express subjective sense of shortness of breath during exertion       Knowledge and understanding of Target Heart Rate Range (THRR) Yes       Intervention Provide education and explanation of THRR including how the numbers were predicted and where they are located for reference       Expected Outcomes Short Term: Able to state/look up THRR;Short Term: Able to use daily as guideline for intensity in rehab;Long Term: Able to use THRR to govern intensity when exercising independently       Able to check pulse independently Yes       Intervention Provide education and demonstration on how to check pulse in carotid and radial arteries.;Review the importance of being able to check your own pulse for safety during independent exercise       Expected Outcomes Short Term: Able to explain why pulse checking is important during independent  exercise;Long Term: Able to check pulse independently and accurately       Understanding of Exercise Prescription Yes       Intervention Provide education, explanation, and written materials on patient's individual exercise prescription       Expected Outcomes Long Term: Able to explain home exercise prescription to exercise independently;Short Term: Able to explain program exercise prescription

## 2024-02-23 NOTE — Progress Notes (Signed)
 Cardiac Individual Treatment Plan  Patient Details  Name: Thomas Williamson MRN: [id number] Date of Birth: Oct 03, 1975 Referring Provider:   Flowsheet Row Cardiac Rehab from 02/23/2024 in [facility] Cardiac and Pulmonary Rehab  Referring Provider Sheena Pugh, DO    Initial Encounter Date:  Flowsheet Row Cardiac Rehab from 02/23/2024 in [facility] Cardiac and Pulmonary Rehab  Date 02/23/24    Visit Diagnosis: Heart failure, chronic systolic (HCC)  Patient's Home Medications on Admission: Current Medications[1]  Past Medical History: Past Medical History:  Diagnosis Date   Acetabulum fracture, left (HCC) 06/30/2022   Alcohol  use disorder, severe, in early remission (HCC) 03/30/2018   Allergy    Anxiety    Bipolar disorder, in partial remission, most recent episode hypomanic (HCC) 03/30/2018   [facility]   Bleeding internal hemorrhoids 09/12/2013   Cannabis dependence (HCC) 07/13/2018   Daily use   Cervical disc disease 11/20/2014   Chronic anxiety 07/13/2018   Chronic systolic (congestive) heart failure (HCC)    Complication of anesthesia    PONV   Depression    Dyslipidemia    GERD (gastroesophageal reflux disease)    Hernia, inguinal, bilateral 02/24/2011   High ankle sprain of right lower extremity 01/16/2015   History of hiatal hernia    Hx of hepatitis C 10/05/2017   -treated in 2019 -hepatology recommended no further surveillance needed except for LFTs with labs and see hepatologist if elevated   Hypertension    Lipids abnormal 09/29/2013   Pneumonia    PONV (postoperative nausea and vomiting)    Vitamin D  deficiency 07/01/2022    Tobacco Use: Tobacco Use History[2]  Labs: Review Flowsheet  More data exists      Latest Ref Rng & Units 11/18/2018 12/11/2019 03/10/2021 02/11/2023 07/06/2023  Labs for ITP Cardiac and Pulmonary Rehab  Cholestrol 0 - 200 mg/dL - 758  720  - 804   LDL (calc) 0 - 99 mg/dL - 860  - - 72   Direct LDL mg/dL - - 824.9   - -  HDL-C >39.00 mg/dL - 43  50.19  - 56.79   Trlycerides 0.0 - 149.0 mg/dL - 613  596.9 Triglyceride is over 400; calculations on Lipids are invalid.  - 398.0   Hemoglobin A1c 4.6 - 6.5 % - - 5.6  - 6.1   Bicarbonate 20.0 - 28.0 mmol/L 20.6  - - - -  TCO2 22 - 32 mmol/L - - - 26  -  Acid-base deficit 0.0 - 2.0 mmol/L 2.7  - - - -  O2 Saturation % 61.9  - - - -     Exercise Target Goals: Exercise Program Goal: Individual exercise prescription set using results from initial 6 min walk test and THRR while considering  patients activity barriers and safety.   Exercise Prescription Goal: Initial exercise prescription builds to 30-45 minutes a day of aerobic activity, 2-3 days per week.  Home exercise guidelines will be given to patient during program as part of exercise prescription that the participant will acknowledge.   Education: Aerobic Exercise: - Group verbal and visual presentation on the components of exercise prescription. Introduces F.I.T.T principle from ACSM for exercise prescriptions.  Reviews F.I.T.T. principles of aerobic exercise including progression. Written material provided at class time.   Education: Resistance Exercise: - Group verbal and visual presentation on the components of exercise prescription. Introduces F.I.T.T principle from ACSM for exercise prescriptions  Reviews F.I.T.T. principles of resistance exercise including progression. Written material provided  at class time.    Education: Exercise & Equipment Safety: - Individual verbal instruction and demonstration of equipment use and safety with use of the equipment. Flowsheet Row Cardiac Rehab from 02/23/2024 in [facility] Cardiac and Pulmonary Rehab  Date 02/23/24  Educator MB  Instruction Review Code 1- Verbalizes Understanding    Education: Exercise Physiology & General Exercise Guidelines: - Group verbal and written instruction with models to review the exercise physiology of the cardiovascular system  and associated critical values. Provides general exercise guidelines with specific guidelines to those with heart or lung disease. Written material provided at class time.   Education: Flexibility, Balance, Mind/Body Relaxation: - Group verbal and visual presentation with interactive activity on the components of exercise prescription. Introduces F.I.T.T principle from ACSM for exercise prescriptions. Reviews F.I.T.T. principles of flexibility and balance exercise training including progression. Also discusses the mind body connection.  Reviews various relaxation techniques to help reduce and manage stress (i.e. Deep breathing, progressive muscle relaxation, and visualization). Balance handout provided to take home. Written material provided at class time.   Activity Barriers & Risk Stratification:  Activity Barriers & Cardiac Risk Stratification - 02/23/24 1329       Activity Barriers & Cardiac Risk Stratification   Activity Barriers Other (comment);Joint Problems   Hx L hip fracture/dislocation   Comments left drop foot    Cardiac Risk Stratification High          6 Minute Walk:  6 Minute Walk     Row Name 02/23/24 1536         6 Minute Walk   Phase Initial     Distance 1320 feet     Walk Time 6 minutes     # of Rest Breaks 0     MPH 2.5     METS 4.34     RPE 11     Perceived Dyspnea  1     VO2 Peak 15.2     Symptoms No     Resting HR 82 bpm     Resting BP 110/80     Resting Oxygen Saturation  96 %     Exercise Oxygen Saturation  during 6 min walk 98 %     Max Ex. HR 104 bpm     Max Ex. BP 138/80     2 Minute Post BP 118/80        Oxygen Initial Assessment:   Oxygen Re-Evaluation:   Oxygen Discharge (Final Oxygen Re-Evaluation):   Initial Exercise Prescription:  Initial Exercise Prescription - 02/23/24 1500       Date of Initial Exercise RX and Referring Provider   Date 02/23/24    Referring Provider Tobb, Kardie, DO      Oxygen   Maintain Oxygen  Saturation 88% or higher      Treadmill   MPH 2.5    Grade 0    Minutes 15    METs 2.91      Bike   Level 1    Watts 50    Minutes 15    METs 4.34      NuStep   Level 4   T6   SPM 80    Minutes 15    METs 4.34      Elliptical   Level 1    Speed 3    Minutes 15    METs 4.34      REL-XR   Level 2    Speed 50    Minutes 15  METs 4.34      Rower   Level 4    Watts 20    Minutes 15    METs 4.34      Track   Laps 35    Minutes 15    METs 2.9      Prescription Details   Frequency (times per week) 3    Duration Progress to 30 minutes of continuous aerobic without signs/symptoms of physical distress      Intensity   THRR 40-80% of Max Heartrate 118-154    Ratings of Perceived Exertion 11-13    Perceived Dyspnea 0-4      Progression   Progression Continue to progress workloads to maintain intensity without signs/symptoms of physical distress.      Resistance Training   Training Prescription Yes    Weight 7 lb    Reps 10-15          Perform Capillary Blood Glucose checks as needed.  Exercise Prescription Changes:   Exercise Prescription Changes     Row Name 02/23/24 1500             Response to Exercise   Blood Pressure (Admit) 110/80       Blood Pressure (Exercise) 138/80       Blood Pressure (Exit) 118/80       Heart Rate (Admit) 82 bpm       Heart Rate (Exercise) 104 bpm       Heart Rate (Exit) 90 bpm       Oxygen Saturation (Admit) 96 %       Oxygen Saturation (Exercise) 98 %       Oxygen Saturation (Exit) 97 %       Rating of Perceived Exertion (Exercise) 11       Perceived Dyspnea (Exercise) 1       Symptoms none       Comments results         Progression   Average METs 4.34          Exercise Comments:   Exercise Goals and Review:   Exercise Goals     Row Name 02/23/24 1544             Exercise Goals   Increase Physical Activity Yes       Intervention Provide advice, education, support and counseling  about physical activity/exercise needs.;Develop an individualized exercise prescription for aerobic and resistive training based on initial evaluation findings, risk stratification, comorbidities and participant's personal goals.       Expected Outcomes Short Term: Attend rehab on a regular basis to increase amount of physical activity.;Long Term: Add in home exercise to make exercise part of routine and to increase amount of physical activity.;Long Term: Exercising regularly at least 3-5 days a week.       Increase Strength and Stamina Yes       Intervention Provide advice, education, support and counseling about physical activity/exercise needs.;Develop an individualized exercise prescription for aerobic and resistive training based on initial evaluation findings, risk stratification, comorbidities and participant's personal goals.       Expected Outcomes Short Term: Increase workloads from initial exercise prescription for resistance, speed, and METs.;Short Term: Perform resistance training exercises routinely during rehab and add in resistance training at home;Long Term: Improve cardiorespiratory fitness, muscular endurance and strength as measured by increased METs and functional capacity ( )       Able to understand and use rate of perceived exertion (RPE) scale Yes  Intervention Provide education and explanation on how to use RPE scale       Expected Outcomes Short Term: Able to use RPE daily in rehab to express subjective intensity level;Long Term:  Able to use RPE to guide intensity level when exercising independently       Able to understand and use Dyspnea scale Yes       Intervention Provide education and explanation on how to use Dyspnea scale       Expected Outcomes Long Term: Able to use Dyspnea scale to guide intensity level when exercising independently;Short Term: Able to use Dyspnea scale daily in rehab to express subjective sense of shortness of breath during exertion        Knowledge and understanding of Target Heart Rate Range (THRR) Yes       Intervention Provide education and explanation of THRR including how the numbers were predicted and where they are located for reference       Expected Outcomes Short Term: Able to state/look up THRR;Short Term: Able to use daily as guideline for intensity in rehab;Long Term: Able to use THRR to govern intensity when exercising independently       Able to check pulse independently Yes       Intervention Provide education and demonstration on how to check pulse in carotid and radial arteries.;Review the importance of being able to check your own pulse for safety during independent exercise       Expected Outcomes Short Term: Able to explain why pulse checking is important during independent exercise;Long Term: Able to check pulse independently and accurately       Understanding of Exercise Prescription Yes       Intervention Provide education, explanation, and written materials on patient's individual exercise prescription       Expected Outcomes Long Term: Able to explain home exercise prescription to exercise independently;Short Term: Able to explain program exercise prescription          Exercise Goals Re-Evaluation :   Discharge Exercise Prescription (Final Exercise Prescription Changes):  Exercise Prescription Changes - 02/23/24 1500       Response to Exercise   Blood Pressure (Admit) 110/80    Blood Pressure (Exercise) 138/80    Blood Pressure (Exit) 118/80    Heart Rate (Admit) 82 bpm    Heart Rate (Exercise) 104 bpm    Heart Rate (Exit) 90 bpm    Oxygen Saturation (Admit) 96 %    Oxygen Saturation (Exercise) 98 %    Oxygen Saturation (Exit) 97 %    Rating of Perceived Exertion (Exercise) 11    Perceived Dyspnea (Exercise) 1    Symptoms none    Comments results      Progression   Average METs 4.34          Nutrition:  Target Goals: Understanding of nutrition guidelines, daily intake of sodium  1500mg , cholesterol 200mg , calories 30% from fat and 7% or less from saturated fats, daily to have 5 or more servings of fruits and vegetables.  Education: Nutrition 1 -Group instruction provided by verbal, written material, interactive activities, discussions, models, and posters to present general guidelines for heart healthy nutrition including macronutrients, label reading, and promoting whole foods over processed counterparts. Education serves as pensions consultant of discussion of heart healthy eating for all. Written material provided at class time.    Education: Nutrition 2 -Group instruction provided by verbal, written material, interactive activities, discussions, models, and posters to present general guidelines for heart  healthy nutrition including sodium, cholesterol, and saturated fat. Providing guidance of habit forming to improve blood pressure, cholesterol, and body weight. Written material provided at class time.     Biometrics:  Pre Biometrics - 02/23/24 1544       Pre Biometrics   Height 5' 10.5 (1.791 m)    Weight 188 lb (85.3 kg)    Waist Circumference 41.2 inches    Hip Circumference 39.5 inches    Waist to Hip Ratio 1.04 %    BMI (Calculated) 26.58    Single Leg Stand 11.5 seconds           Nutrition Therapy Plan and Nutrition Goals:  Nutrition Therapy & Goals - 02/23/24 1545       Personal Nutrition Goals   Nutrition Goal Will meet with RD on 2/2      Intervention Plan   Intervention Prescribe, educate and counsel regarding individualized specific dietary modifications aiming towards targeted core components such as weight, hypertension, lipid management, diabetes, heart failure and other comorbidities.;Nutrition handout(s) given to patient.    Expected Outcomes Short Term Goal: Understand basic principles of dietary content, such as calories, fat, sodium, cholesterol and nutrients.;Short Term Goal: A plan has been developed with personal nutrition goals  set during dietitian appointment.;Long Term Goal: Adherence to prescribed nutrition plan.          Nutrition Assessments:  MEDIFICTS Score Key: >=70 Need to make dietary changes  40-70 Heart Healthy Diet <= 40 Therapeutic Level Cholesterol Diet  Flowsheet Row Cardiac Rehab from 02/23/2024 in [facility] Cardiac and Pulmonary Rehab  Picture Your Plate Total Score on Admission 41   Picture Your Plate Scores: <59 Unhealthy dietary pattern with much room for improvement. 41-50 Dietary pattern unlikely to meet recommendations for good health and room for improvement. 51-60 More healthful dietary pattern, with some room for improvement.  >60 Healthy dietary pattern, although there may be some specific behaviors that could be improved.    Nutrition Goals Re-Evaluation:   Nutrition Goals Discharge (Final Nutrition Goals Re-Evaluation):   Psychosocial: Target Goals: Acknowledge presence or absence of significant depression and/or stress, maximize coping skills, provide positive support system. Participant is able to verbalize types and ability to use techniques and skills needed for reducing stress and depression.   Education: Stress, Anxiety, and Depression - Group verbal and visual presentation to define topics covered.  Reviews how body is impacted by stress, anxiety, and depression.  Also discusses healthy ways to reduce stress and to treat/manage anxiety and depression. Written material provided at class time.   Education: Sleep Hygiene -Provides group verbal and written instruction about how sleep can affect your health.  Define sleep hygiene, discuss sleep cycles and impact of sleep habits. Review good sleep hygiene tips.   Initial Review & Psychosocial Screening:  Initial Psych Review & Screening - 02/23/24 1323       Initial Review   Current issues with Current Psychotropic Meds;Current Depression;History of Depression;Current Anxiety/Panic      Family Dynamics   Good Support  System? Yes    Comments Arthur can look to his parents and brother for support. He takes medication for his mood  and anxiety. He has had a history with drinking and has come a long way and reduced his alcohol  intake.      Barriers   Psychosocial barriers to participate in program The patient should benefit from training in stress management and relaxation.      Screening Interventions   Interventions Encouraged  to exercise;Provide feedback about the scores to participant;To provide support and resources with identified psychosocial needs    Expected Outcomes Short Term goal: Utilizing psychosocial counselor, staff and physician to assist with identification of specific Stressors or current issues interfering with healing process. Setting desired goal for each stressor or current issue identified.;Long Term Goal: Stressors or current issues are controlled or eliminated.;Short Term goal: Identification and review with participant of any Quality of Life or Depression concerns found by scoring the questionnaire.;Long Term goal: The participant improves quality of Life and PHQ9 Scores as seen by post scores and/or verbalization of changes          Quality of Life Scores:   Quality of Life - 02/23/24 1545       Quality of Life   Select Quality of Life      Quality of Life Scores   Health/Function Pre 14.9 %    Socioeconomic Pre 18.44 %    Psych/Spiritual Pre 11 %    Family Pre 21.4 %    GLOBAL Pre 15.86 %         Scores of 19 and below usually indicate a poorer quality of life in these areas.  A difference of  2-3 points is a clinically meaningful difference.  A difference of 2-3 points in the total score of the Quality of Life Index has been associated with significant improvement in overall quality of life, self-image, physical symptoms, and general health in studies assessing change in quality of life.  PHQ-9: Review Flowsheet  More data exists      02/23/2024 07/06/2023 01/08/2022  12/05/2021 10/29/2021  Depression screen PHQ 2/9  Decreased Interest 1 1 0 1 2  Down, Depressed, Hopeless 2 1 0 1 2  PHQ - 2 Score 3 2 0 2 4  Altered sleeping 0 1 - 0 1  Tired, decreased energy 1 2 - 1 1  Change in appetite 0 1 - 2 2  Feeling bad or failure about yourself  1 1 - 2 2  Trouble concentrating 1 2 - 1 2  Moving slowly or fidgety/restless 1 0 - 2 0  Suicidal thoughts 0 0 - 0 0  PHQ-9 Score 7 9  - 10  12   Difficult doing work/chores Somewhat difficult Somewhat difficult - Very difficult Somewhat difficult    Details       Data saved with a previous flowsheet row definition        Interpretation of Total Score  Total Score Depression Severity:  1-4 = Minimal depression, 5-9 = Mild depression, 10-14 = Moderate depression, 15-19 = Moderately severe depression, 20-27 = Severe depression   Psychosocial Evaluation and Intervention:  Psychosocial Evaluation - 02/23/24 1326       Psychosocial Evaluation & Interventions   Interventions Relaxation education;Stress management education;Encouraged to exercise with the program and follow exercise prescription    Comments Marquarius can look to his parents and brother for support. He takes medication for his mood and anxiety. He has had a history with drinking and has come a long way and reduced his alcohol  intake.    Expected Outcomes Short: Start [facility] to help with mood. Long: Maintain a healthy mental state    Continue Psychosocial Services  Follow up required by staff          Psychosocial Re-Evaluation:   Psychosocial Discharge (Final Psychosocial Re-Evaluation):   Vocational Rehabilitation: Provide vocational rehab assistance to qualifying candidates.   Vocational Rehab Evaluation & Intervention:  Vocational Rehab - 02/23/24 1330       Initial Vocational Rehab Evaluation & Intervention   Assessment shows need for Vocational Rehabilitation No          Education: Education Goals: Education classes will be  provided on a variety of topics geared toward better understanding of heart health and risk factor modification. Participant will state understanding/return demonstration of topics presented as noted by education test scores.  Learning Barriers/Preferences:  Learning Barriers/Preferences - 02/23/24 1319       Learning Barriers/Preferences   Learning Barriers None    Learning Preferences None          General Cardiac Education Topics:  AED/CPR: - Group verbal and written instruction with the use of models to demonstrate the basic use of the AED with the basic ABC's of resuscitation.   Test and Procedures: - Group verbal and visual presentation and models provide information about basic cardiac anatomy and function. Reviews the testing methods done to diagnose heart disease and the outcomes of the test results. Describes the treatment choices: Medical Management, Angioplasty, or Coronary Bypass Surgery for treating various heart conditions including Myocardial Infarction, Angina, Valve Disease, and Cardiac Arrhythmias. Written material provided at class time. Flowsheet Row Cardiac Rehab from 02/23/2024 in [facility] Cardiac and Pulmonary Rehab  Education need identified 02/23/24    Medication Safety: - Group verbal and visual instruction to review commonly prescribed medications for heart and lung disease. Reviews the medication, class of the drug, and side effects. Includes the steps to properly store meds and maintain the prescription regimen. Written material provided at class time.   Intimacy: - Group verbal instruction through game format to discuss how heart and lung disease can affect sexual intimacy. Written material provided at class time.   Know Your Numbers and Heart Failure: - Group verbal and visual instruction to discuss disease risk factors for cardiac and pulmonary disease and treatment options.  Reviews associated critical values for Overweight/Obesity, Hypertension,  Cholesterol, and Diabetes.  Discusses basics of heart failure: signs/symptoms and treatments.  Introduces Heart Failure Zone chart for action plan for heart failure. Written material provided at class time.   Infection Prevention: - Provides verbal and written material to individual with discussion of infection control including proper hand washing and proper equipment cleaning during exercise session. Flowsheet Row Cardiac Rehab from 02/23/2024 in [facility] Cardiac and Pulmonary Rehab  Date 02/23/24  Educator MB  Instruction Review Code 1- Verbalizes Understanding    Falls Prevention: - Provides verbal and written material to individual with discussion of falls prevention and safety. Flowsheet Row Cardiac Rehab from 02/23/2024 in [facility] Cardiac and Pulmonary Rehab  Date 02/23/24  Educator MB  Instruction Review Code 1- Verbalizes Understanding    Other: -Provides group and verbal instruction on various topics (see comments)   Knowledge Questionnaire Score:  Knowledge Questionnaire Score - 02/23/24 1546       Knowledge Questionnaire Score   Pre Score 23/26          Core Components/Risk Factors/Patient Goals at Admission:  Personal Goals and Risk Factors at Admission - 02/23/24 1319       Core Components/Risk Factors/Patient Goals on Admission    Weight Management Yes;Weight Loss    Intervention Weight Management: Develop a combined nutrition and exercise program designed to reach desired caloric intake, while maintaining appropriate intake of nutrient and fiber, sodium and fats, and appropriate energy expenditure required for the weight goal.;Weight Management: Provide education and appropriate resources to help participant  work on and attain dietary goals.;Weight Management/Obesity: Establish reasonable short term and long term weight goals.    Admit Weight 188 lb (85.3 kg)    Goal Weight: Short Term 185 lb (83.9 kg)    Goal Weight: Long Term 180 lb (81.6 kg)    Expected Outcomes  Short Term: Continue to assess and modify interventions until short term weight is achieved;Long Term: Adherence to nutrition and physical activity/exercise program aimed toward attainment of established weight goal;Weight Loss: Understanding of general recommendations for a balanced deficit meal plan, which promotes 1-2 lb weight loss per week and includes a negative energy balance of [phone] kcal/d;Understanding recommendations for meals to include 15-35% energy as protein, 25-35% energy from fat, 35-60% energy from carbohydrates, less than 200mg  of dietary cholesterol, 20-35 gm of total fiber daily;Understanding of distribution of calorie intake throughout the day with the consumption of 4-5 meals/snacks    Tobacco Cessation Yes    Number of packs per day Vape    Intervention Assist the participant in steps to quit. Provide individualized education and counseling about committing to Tobacco Cessation, relapse prevention, and pharmacological support that can be provided by physician.;Education officer, environmental, assist with locating and accessing local/national Quit Smoking programs, and support quit date choice.    Expected Outcomes Short Term: Will quit all tobacco product use, adhering to prevention of relapse plan.;Short Term: Will demonstrate readiness to quit, by selecting a quit date.;Long Term: Complete abstinence from all tobacco products for at least 12 months from quit date.    Heart Failure Yes    Intervention Provide a combined exercise and nutrition program that is supplemented with education, support and counseling about heart failure. Directed toward relieving symptoms such as shortness of breath, decreased exercise tolerance, and extremity edema.    Expected Outcomes Improve functional capacity of life;Short term: Attendance in program 2-3 days a week with increased exercise capacity. Reported lower sodium intake. Reported increased fruit and vegetable intake. Reports medication  compliance.;Short term: Daily weights obtained and reported for increase. Utilizing diuretic protocols set by physician.;Long term: Adoption of self-care skills and reduction of barriers for early signs and symptoms recognition and intervention leading to self-care maintenance.    Hypertension Yes    Intervention Provide education on lifestyle modifcations including regular physical activity/exercise, weight management, moderate sodium restriction and increased consumption of fresh fruit, vegetables, and low fat dairy, alcohol  moderation, and smoking cessation.;Monitor prescription use compliance.    Expected Outcomes Short Term: Continued assessment and intervention until BP is < 140/61mm HG in hypertensive participants. < 130/51mm HG in hypertensive participants with diabetes, heart failure or chronic kidney disease.;Long Term: Maintenance of blood pressure at goal levels.    Lipids Yes    Intervention Provide education and support for participant on nutrition & aerobic/resistive exercise along with prescribed medications to achieve LDL 70mg , HDL >40mg .    Expected Outcomes Short Term: Participant states understanding of desired cholesterol values and is compliant with medications prescribed. Participant is following exercise prescription and nutrition guidelines.;Long Term: Cholesterol controlled with medications as prescribed, with individualized exercise RX and with personalized nutrition plan. Value goals: LDL < 70mg , HDL > 40 mg.          Education:Diabetes - Individual verbal and written instruction to review signs/symptoms of diabetes, desired ranges of glucose level fasting, after meals and with exercise. Acknowledge that pre and post exercise glucose checks will be done for 3 sessions at entry of program.   Core Components/Risk Factors/Patient Goals Review:  Core Components/Risk Factors/Patient Goals at Discharge (Final Review):    ITP Comments:  ITP Comments     Row Name  02/23/24 1535           ITP Comments Completed and gym orientation for cardiac rehab. Initial ITP created and sent for review to Dr. Oneil Pinal, Medical Director.          Comments: Initial ITP    [1]  Current Outpatient Medications:    amitriptyline (ELAVIL) 50 MG tablet, Take 50 mg by mouth at bedtime., Disp: , Rfl:    ARIPiprazole (ABILIFY) 10 MG tablet, Take 10 mg by mouth daily., Disp: , Rfl:    dapagliflozin  propanediol (FARXIGA ) 10 MG TABS tablet, Take 1 tablet (10 mg total) by mouth daily before breakfast., Disp: 90 tablet, Rfl: 3   ferrous sulfate  325 (65 FE) MG tablet, Take 325 mg by mouth daily., Disp: , Rfl:    gabapentin  (NEURONTIN ) 300 MG capsule, Take 300 mg by mouth 2 (two) times daily., Disp: , Rfl:    Iron , Ferrous Sulfate , 325 (65 Fe) MG TABS, Take 325 mg by mouth daily., Disp: 90 tablet, Rfl: 3   metoprolol  succinate (TOPROL -XL) 25 MG 24 hr tablet, Take 1 tablet (25 mg total) by mouth daily., Disp: 90 tablet, Rfl: 3   Omeprazole 20 MG TBEC, Take 20 mg by mouth daily. , Disp: , Rfl:    pravastatin  (PRAVACHOL ) 40 MG tablet, Take 1 tablet (40 mg total) by mouth daily., Disp: 90 tablet, Rfl: 3   sacubitril -valsartan  (ENTRESTO ) 24-26 MG, Take 1 tablet by mouth 2 (two) times daily., Disp: 180 tablet, Rfl: 3   sildenafil  (VIAGRA ) 100 MG tablet, Take 1 tablet (100 mg total) by mouth daily as needed for erectile dysfunction., Disp: 10 tablet, Rfl: 11   spironolactone  (ALDACTONE ) 25 MG tablet, Take 0.5 tablets (12.5 mg total) by mouth daily., Disp: 45 tablet, Rfl: 3   amitriptyline (ELAVIL) 25 MG tablet, Take 50 mg by mouth at bedtime. (Patient not taking: Reported on 02/23/2024), Disp: , Rfl:    spironolactone  (ALDACTONE ) 25 MG tablet, Take 12.5 mg by mouth. (Patient not taking: Reported on 02/23/2024), Disp: , Rfl:  [2]  Social History Tobacco Use  Smoking Status Some Days   Types: E-cigarettes  Smokeless Tobacco Never

## 2024-02-24 ENCOUNTER — Encounter

## 2024-02-24 DIAGNOSIS — I5022 Chronic systolic (congestive) heart failure: Secondary | ICD-10-CM | POA: Diagnosis not present

## 2024-02-24 NOTE — Progress Notes (Signed)
 Daily Session Note  Patient Details  Name: Thomas Williamson MRN: [id number] Date of Birth: 11/07/1975 Referring Provider:   Flowsheet Row Cardiac Rehab from 02/23/2024 in [facility] Cardiac and Pulmonary Rehab  Referring Provider Sheena Pugh, DO    Encounter Date: 02/24/2024  Check In:  Session Check In - 02/24/24 1354       Check-In   Supervising physician immediately available to respond to emergencies See telemetry face sheet for immediately available ER MD    Location [facility]-Cardiac & Pulmonary Rehab    Staff Present Hoy Rodney RN,BSN;Joseph Rolinda NORWOOD HARMAN Verlie Laird, [place], Exercise Physiologist;Margaret Best, MS, Exercise Physiologist    Virtual Visit No    Medication changes reported     No    Fall or balance concerns reported    No    Warm-up and Cool-down Performed on first and last piece of equipment    Resistance Training Performed Yes    VAD Patient? No    PAD/SET Patient? No      Pain Assessment   Currently in Pain? No/denies             Tobacco Use History[1]  Goals Met:  Independence with exercise equipment Exercise tolerated well No report of concerns or symptoms today Strength training completed today  Goals Unmet:  Not Applicable  Comments: First full day of exercise!  Patient was oriented to gym and equipment including functions, settings, policies, and procedures.  Patient's individual exercise prescription and treatment plan were reviewed.  All starting workloads were established based on the results of the 6 minute walk test done at initial orientation visit.  The plan for exercise progression was also introduced and progression will be customized based on patient's performance and goals.    Dr. Oneil Pinal is Medical Director for [facility] Cardiac Rehabilitation.  Dr. Fuad Aleskerov is Medical Director for [facility] Pulmonary Rehabilitation.    [1]  Social History Tobacco Use  Smoking Status Some Days   Types: E-cigarettes   Smokeless Tobacco Never

## 2024-02-28 ENCOUNTER — Encounter

## 2024-02-29 ENCOUNTER — Ambulatory Visit (HOSPITAL_COMMUNITY)
Admission: RE | Admit: 2024-02-29 | Discharge: 2024-02-29 | Disposition: A | Source: Ambulatory Visit | Attending: Internal Medicine | Admitting: Internal Medicine

## 2024-02-29 VITALS — BP 118/74 | HR 97 | Wt 192.0 lb

## 2024-02-29 DIAGNOSIS — F3171 Bipolar disorder, in partial remission, most recent episode hypomanic: Secondary | ICD-10-CM | POA: Diagnosis not present

## 2024-02-29 DIAGNOSIS — I447 Left bundle-branch block, unspecified: Secondary | ICD-10-CM | POA: Diagnosis not present

## 2024-02-29 DIAGNOSIS — Z79899 Other long term (current) drug therapy: Secondary | ICD-10-CM | POA: Insufficient documentation

## 2024-02-29 DIAGNOSIS — R931 Abnormal findings on diagnostic imaging of heart and coronary circulation: Secondary | ICD-10-CM

## 2024-02-29 DIAGNOSIS — F1910 Other psychoactive substance abuse, uncomplicated: Secondary | ICD-10-CM | POA: Insufficient documentation

## 2024-02-29 DIAGNOSIS — I5022 Chronic systolic (congestive) heart failure: Secondary | ICD-10-CM | POA: Diagnosis present

## 2024-02-29 DIAGNOSIS — I110 Hypertensive heart disease with heart failure: Secondary | ICD-10-CM | POA: Diagnosis not present

## 2024-02-29 DIAGNOSIS — Z7984 Long term (current) use of oral hypoglycemic drugs: Secondary | ICD-10-CM | POA: Insufficient documentation

## 2024-02-29 DIAGNOSIS — I426 Alcoholic cardiomyopathy: Secondary | ICD-10-CM | POA: Insufficient documentation

## 2024-02-29 MED ORDER — METOPROLOL SUCCINATE ER 50 MG PO TB24: 50.0000 mg | ORAL_TABLET | Freq: Every day | ORAL | 3 refills | Status: AC

## 2024-02-29 NOTE — Progress Notes (Signed)
 "  ADVANCED HF CLINIC CONSULT NOTE  Referring Physician: Johnny Garnette LABOR, MD Primary Care: Johnny Garnette LABOR, MD Primary Cardiologist: Dub Huntsman, DO  Chief Complaint: HF  HPI:  Thomas Williamson is a 49 y.o. male with a ETOH use, HTN, bipolar d/o. 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 Referred by Dr. Huntsman for further evaluation of his HF.    Was seen by Dr. Jeffrie in 2020 for LBBB. Echo obtained EF 30-35%. Myoview  EF 37% No ischemia or infarct. He was lost to f/u.   Seen again in 2024 EF 25-30% RV normal.   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 Says he started drinking at age 70. Began drinking heavily around 2015. Has been in rehab 5x since then. Longest sober period was about 4 months. Now vaping and smoking THC. Drinks 2-3 whiskey on the rocks 2-3 days/week. Compliant with meds. Lives on 2nd floor so can go up and down steps. Gets tired easily. SOB with moderate activity. No CP.   Past Medical History:  Diagnosis Date   Acetabulum fracture, left (HCC) 06/30/2022   Alcohol  use disorder, severe, in early remission (HCC) 03/30/2018   Allergy    Anxiety    Bipolar disorder, in partial remission, most recent episode hypomanic (HCC) 03/30/2018   [facility]   Bleeding internal hemorrhoids 09/12/2013   Cannabis dependence (HCC) 07/13/2018   Daily use   Cervical disc disease 11/20/2014   Chronic anxiety 07/13/2018   Chronic systolic (congestive) heart failure (HCC)    Complication of anesthesia    PONV   Depression    Dyslipidemia    GERD (gastroesophageal reflux disease)    Hernia, inguinal, bilateral 02/24/2011   High ankle sprain of right lower extremity 01/16/2015   History of hiatal hernia    Hx of hepatitis C 10/05/2017   -treated in 2019 -hepatology recommended no further surveillance needed except for LFTs with labs  and see hepatologist if elevated   Hypertension    Lipids abnormal 09/29/2013   Pneumonia    PONV (postoperative nausea and vomiting)    Vitamin D  deficiency 07/01/2022    Current Outpatient Medications  Medication Sig Dispense Refill   amitriptyline (ELAVIL) 50 MG tablet Take 50 mg by mouth at bedtime.     ARIPiprazole (ABILIFY) 10 MG tablet Take 10 mg by mouth daily.     dapagliflozin  propanediol (FARXIGA ) 10 MG TABS tablet Take 1 tablet (10 mg total) by mouth daily before breakfast. 90 tablet 3   gabapentin  (NEURONTIN ) 300 MG capsule Take 300 mg by mouth 2 (two) times daily.     Iron , Ferrous Sulfate , 325 (65 Fe) MG TABS Take 325 mg by mouth daily. 90 tablet 3   metoprolol  succinate (TOPROL -XL) 25 MG 24 hr tablet Take 1 tablet (25 mg total) by mouth daily. 90 tablet 3   Omeprazole 20 MG TBEC Take 20 mg by mouth daily.      pravastatin  (PRAVACHOL ) 40 MG tablet Take 1 tablet (40 mg total) by mouth daily. 90 tablet 3   sacubitril -valsartan  (ENTRESTO ) 24-26 MG Take 1 tablet by mouth 2 (two) times daily. 180 tablet 3   sildenafil  (VIAGRA ) 100 MG tablet Take 1 tablet (100 mg total) by mouth daily as needed for erectile dysfunction. 10 tablet 11   spironolactone  (ALDACTONE ) 25 MG tablet Take  0.5 tablets (12.5 mg total) by mouth daily. 45 tablet 3   No current facility-administered medications for this encounter.    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  Tobacco Use   Smoking status: Some Days    Types: E-cigarettes   Smokeless tobacco: Never  Vaping Use   Vaping status: Every Day   Substances: CBD  Substance and Sexual Activity   Alcohol  use: Yes    Alcohol /week: 4.0 standard drinks of alcohol      Types: 4 Shots of liquor per week    Comment: (recovering alcoholic) occassioanl drink - drank one double last night, did not drink Sunday   Drug use: Yes    Frequency: 2.0 times per week    Types: Marijuana    Comment: last use 02/05/2024   Sexual activity: Yes    Birth control/protection: Condom  Other Topics Concern   Not on file  Social History Narrative   Work or School: [employer], 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, 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      Home Situation: lives alone      Spiritual Beliefs:      Lifestyle:       Hx alcohol  abuse   Social Drivers of Health   Tobacco Use: High Risk (02/23/2024)   Patient History    Smoking Tobacco Use: Some Days    Smokeless Tobacco Use: Never    Passive Exposure: Not on file  Financial Resource Strain: Low Risk (06/12/2022)   Received from [facility]   Overall Financial Resource Strain (CARDIA)    Difficulty of Paying Living Expenses: Not very hard  Recent Concern: Financial Resource Strain - High Risk (04/06/2022)   Received from [facility]   Overall Financial Resource Strain (CARDIA)    Difficulty of Paying Living Expenses: Very hard  Food Insecurity: Low Risk (01/20/2024)   Received from [facility]   Epic    Within the past 12 months, you worried that your food would run out before you got money to buy more: Never true    Within the past 12 months, the food you bought just didn't last and you didn't have money to get more. : Never true  Transportation Needs: No Transportation Needs (01/20/2024)   Received from [facility]    In the past 12 months, has lack of reliable transportation kept you from medical appointments, meetings, work or from getting things needed for daily living? : No  Physical Activity: Sufficiently Active (06/12/2022)   Received from [facility]   Exercise Vital Sign    On average, how many days per week do you engage in moderate to strenuous exercise (like a brisk  walk)?: 7 days    On average, how many minutes do you engage in exercise at this level?: 30 min  Stress: No Stress Concern Present (06/12/2022)   Received from [facility] - Occupational Stress Questionnaire    Feeling of Stress : Not at all  Social Connections: Socially Isolated (06/12/2022)   Received from [facility]   Social Connection and Isolation Panel  In a typical week, how many times do you talk on the phone with family, friends, or neighbors?: More than three times a week    How often do you get together with friends or relatives?: Twice a week    How often do you attend church or religious services?: Never    Do you belong to any clubs or organizations such as church groups, unions, fraternal or athletic groups, or school groups?: No    How often do you attend meetings of the clubs or organizations you belong to?: Never    Are you married, widowed, divorced, separated, never married, or living with a partner?: Never married  Intimate Partner Violence: Not At Risk (06/12/2022)   Received from [facility]   Epic    Within the last year, have you been afraid of your partner or ex-partner?: No    Within the last year, have you been humiliated or emotionally abused in other ways by your partner or ex-partner?: No    Within the last year, have you been kicked, hit, slapped, or otherwise physically hurt by your partner or ex-partner?: No    Within the last year, have you been raped or forced to have any kind of sexual activity by your partner or ex-partner?: No  Depression (PHQ2-9): Medium Risk (02/23/2024)   Depression (PHQ2-9)    PHQ-2 Score: 7  Alcohol  Screen: Not on file  Housing: Low Risk (01/20/2024)   Received from [facility]   Epic    What is your living situation today?: I have a steady place to live    Think about the place you live. Do you have problems with any of the following? Choose all that apply:: None/None on  this list  Utilities: Low Risk (01/20/2024)   Received from [facility]   Utilities    In the past 12 months has the electric, gas, oil, or water company threatened to shut off services in your home? : No  Health Literacy: Low Risk (06/12/2022)   Received from [facility] Literacy    How often do you need to have someone help you when you read instructions, pamphlets, or other written material from your doctor or pharmacy?: Never      Family History  Problem Relation Age of Onset   Hypertension Mother    Atrial fibrillation Mother    Cancer Father    Hypertension Father    Leukemia Father 6   Prostate cancer Father 15   Diabetes Father    Diabetes Brother    Other Brother        growth on finger   Learning disabilities Paternal Aunt    Lung cancer Maternal Grandmother    Heart attack Maternal Grandfather    Congestive Heart Failure Paternal Grandmother    Colon cancer Neg Hx    Rectal cancer Neg Hx    Stomach cancer Neg Hx     Vitals:   02/29/24 1044  BP: 118/74  Pulse: 97  SpO2: 97%  Weight: 87.1 kg (192 lb)    PHYSICAL EXAM: General:  Well appearing. No respiratory difficulty HEENT: normal Neck: supple. no JVD. Carotids 2+ bilat; no bruits. No lymphadenopathy or thryomegaly appreciated. Cor: PMI nondisplaced. Regular rate & rhythm. No rubs, gallops or murmurs. Lungs: clear Abdomen: soft, nontender, nondistended. No hepatosplenomegaly. No bruits or masses. Good bowel sounds. Extremities: no cyanosis, clubbing, rash, edema Neuro: alert & oriented x 3, cranial nerves grossly intact. moves all 4  extremities w/o difficulty. Affect pleasant.  ECG: NSR 97 LBBB 152 ms Personally reviewed  ASSESSMENT & PLAN:  1. Chronic systolic HF - Echo 2020 done for LBBB EF 30-35%. Myoview  EF 37% No ischemia or infarct.  - Echo 2024 EF 25-30% RV normal.  - Myoview  1/25 in [place]  EF 25% no ischemia/infarct - Echo 12/25 EF 25-30%  - suspect he has LBBB CM  -  Will get cMRI to further evaluate and then send for CRT-D - NYHA II-III. Volume ok - Continue GDMT - Continue Farxiga  10 - Increase Toprol  25 -> 50 daily - Continue Entresto  24/26 bid - Continue spiro 12.5   2. LBBB - plan as above  3. Polysubstance abuse/bipolar disorder - per primary  - we discussed need for cessation   Thaddus Mcdowell, MD  10:56 AM      [1] No Known Allergies  "

## 2024-02-29 NOTE — Patient Instructions (Signed)
 CHANGE Toprol  to 50 mg daily.  Your physician has requested that you have a cardiac MRI. Cardiac MRI uses a computer to create images of your heart as its beating, producing both still and moving pictures of your heart and major blood vessels. For further information please visit [email or web address]. Please follow the instruction sheet given to you today for more information. YOU WILL BE CALLED TO HAVE THIS TEST ARRANGED.  Your physician recommends that you schedule a follow-up appointment in: 4 months.  If you have any questions or concerns before your next appointment please send us  a message through [place] or call our office at [phone].    TO LEAVE A MESSAGE FOR THE NURSE SELECT OPTION 2, PLEASE LEAVE A MESSAGE INCLUDING: YOUR NAME DATE OF BIRTH CALL BACK NUMBER REASON FOR CALL**this is important as we prioritize the call backs  YOU WILL RECEIVE A CALL BACK THE SAME DAY AS LONG AS YOU CALL BEFORE 4:00 PM  At the [facility], you and your health needs are our priority. As part of our continuing mission to provide you with exceptional heart care, we have created designated Provider Care Teams. These Care Teams include your primary Cardiologist (physician) and Advanced Practice Providers (APPs- Physician Assistants and Nurse Practitioners) who all work together to provide you with the care you need, when you need it.   You may see any of the following providers on your designated Care Team at your next follow up: Dr Toribio Fuel Dr Ezra Shuck Dr. Morene Brownie Greig Mosses, NP Caffie Shed, [place] [facility] [place], [place] Beckey Coe, NP Jordan Lee, NP Ellouise Class, NP Tinnie Redman, PharmD Jaun Bash, PharmD   Please be sure to bring in all your medications bottles to every appointment.    Thank you for choosing [facility]

## 2024-02-29 NOTE — Addendum Note (Signed)
 Encounter addended by: Marcelina Lisa HERO, RN on: 02/29/2024 11:40 AM  Actions taken: Order list changed, Diagnosis association updated, Clinical Note Signed

## 2024-03-01 ENCOUNTER — Encounter: Admitting: Emergency Medicine

## 2024-03-01 DIAGNOSIS — I5022 Chronic systolic (congestive) heart failure: Secondary | ICD-10-CM | POA: Diagnosis not present

## 2024-03-01 NOTE — Progress Notes (Signed)
 Daily Session Note  Patient Details  Name: Thomas Williamson MRN: [id number] Date of Birth: 1975/06/30 Referring Provider:   Flowsheet Row Cardiac Rehab from 02/23/2024 in [facility] Cardiac and Pulmonary Rehab  Referring Provider Sheena Pugh, DO    Encounter Date: 03/01/2024  Check In:  Session Check In - 03/01/24 1410       Check-In   Supervising physician immediately available to respond to emergencies See telemetry face sheet for immediately available ER MD    Location [facility]-Cardiac & Pulmonary Rehab    Staff Present Leita Franks RN,BSN;Joseph Rolinda RCP,RRT,BSRT;Kelly Bollinger RN,BSN,MPA;Kelly Dyane BS, ACSM CEP, Exercise Physiologist    Virtual Visit No    Medication changes reported     No    Fall or balance concerns reported    No    Tobacco Cessation No Change    Warm-up and Cool-down Performed on first and last piece of equipment    Resistance Training Performed Yes    VAD Patient? No    PAD/SET Patient? No      Pain Assessment   Currently in Pain? No/denies             Tobacco Use History[1]  Goals Met:  Independence with exercise equipment Exercise tolerated well No report of concerns or symptoms today Strength training completed today  Goals Unmet:  Not Applicable  Comments: Pt able to follow exercise prescription today without complaint.  Will continue to monitor for progression.    Dr. Oneil Pinal is Medical Director for [facility] Cardiac Rehabilitation.  Dr. Fuad Aleskerov is Medical Director for [facility] Pulmonary Rehabilitation.    [1]  Social History Tobacco Use  Smoking Status Some Days   Types: E-cigarettes  Smokeless Tobacco Never

## 2024-03-02 ENCOUNTER — Encounter: Admitting: *Deleted

## 2024-03-02 DIAGNOSIS — I5022 Chronic systolic (congestive) heart failure: Secondary | ICD-10-CM | POA: Diagnosis not present

## 2024-03-02 NOTE — Progress Notes (Signed)
 Daily Session Note  Patient Details  Name: Thomas Williamson MRN: [id number] Date of Birth: 1975-06-30 Referring Provider:   Flowsheet Row Cardiac Rehab from 02/23/2024 in [facility] Cardiac and Pulmonary Rehab  Referring Provider Sheena Pugh, DO    Encounter Date: 03/02/2024  Check In:  Session Check In - 03/02/24 1352       Check-In   Supervising physician immediately available to respond to emergencies See telemetry face sheet for immediately available ER MD    Location [facility]-Cardiac & Pulmonary Rehab    Staff Present Hoy Rodney RN,BSN;Maxon Burnell BS, Exercise Physiologist;Joseph Hood RCP,RRT,BSRT;Noah Tickle, [place], Exercise Physiologist    Virtual Visit No    Medication changes reported     No    Fall or balance concerns reported    No    Warm-up and Cool-down Performed on first and last piece of equipment    Resistance Training Performed Yes    VAD Patient? No    PAD/SET Patient? No      Pain Assessment   Currently in Pain? No/denies             Tobacco Use History[1]  Goals Met:  Independence with exercise equipment Exercise tolerated well No report of concerns or symptoms today Strength training completed today  Goals Unmet:  Not Applicable  Comments: Pt able to follow exercise prescription today without complaint.  Will continue to monitor for progression.    Dr. Oneil Pinal is Medical Director for [facility] Cardiac Rehabilitation.  Dr. Fuad Aleskerov is Medical Director for [facility] Pulmonary Rehabilitation.    [1]  Social History Tobacco Use  Smoking Status Some Days   Types: E-cigarettes  Smokeless Tobacco Never

## 2024-03-06 ENCOUNTER — Encounter

## 2024-03-08 ENCOUNTER — Encounter

## 2024-03-08 DIAGNOSIS — I5022 Chronic systolic (congestive) heart failure: Secondary | ICD-10-CM

## 2024-03-08 NOTE — Progress Notes (Signed)
 Daily Session Note  Patient Details  Name: Thomas Williamson MRN: [id number] Date of Birth: November 02, 1975 Referring Provider:   Flowsheet Row Cardiac Rehab from 02/23/2024 in [facility] Cardiac and Pulmonary Rehab  Referring Provider Sheena Pugh, DO    Encounter Date: 03/08/2024  Check In:  Session Check In - 03/08/24 1342       Check-In   Supervising physician immediately available to respond to emergencies See telemetry face sheet for immediately available ER MD    Location [facility]-Cardiac & Pulmonary Rehab    Staff Present Burnard Davenport RN,BSN,MPA;Laura Cates RN,BSN;Gidget Quizhpi Dyane BS, ACSM CEP, Exercise Physiologist;Noah Tickle, BS, Exercise Physiologist    Virtual Visit No    Medication changes reported     No    Fall or balance concerns reported    No    Tobacco Cessation No Change    Warm-up and Cool-down Performed on first and last piece of equipment    Resistance Training Performed Yes    VAD Patient? No    PAD/SET Patient? No      Pain Assessment   Currently in Pain? No/denies             Tobacco Use History[1]  Goals Met:  Independence with exercise equipment Exercise tolerated well No report of concerns or symptoms today Strength training completed today  Goals Unmet:  Not Applicable  Comments: Pt able to follow exercise prescription today without complaint.  Will continue to monitor for progression.    Dr. Oneil Pinal is Medical Director for [facility] Cardiac Rehabilitation.  Dr. Fuad Aleskerov is Medical Director for [facility] Pulmonary Rehabilitation.    [1]  Social History Tobacco Use  Smoking Status Some Days   Types: E-cigarettes  Smokeless Tobacco Never

## 2024-03-09 ENCOUNTER — Encounter: Admitting: *Deleted

## 2024-03-09 DIAGNOSIS — I5022 Chronic systolic (congestive) heart failure: Secondary | ICD-10-CM

## 2024-03-09 NOTE — Progress Notes (Signed)
 Daily Session Note  Patient Details  Name: Thomas Williamson MRN: [id number] Date of Birth: 08/18/1975 Referring Provider:   Flowsheet Row Cardiac Rehab from 02/23/2024 in [facility] Cardiac and Pulmonary Rehab  Referring Provider Sheena Pugh, DO    Encounter Date: 03/09/2024  Check In:  Session Check In - 03/09/24 1355       Check-In   Supervising physician immediately available to respond to emergencies See telemetry face sheet for immediately available ER MD    Location [facility]-Cardiac & Pulmonary Rehab    Staff Present Hoy Rodney RN,BSN;[facility] RCP,RRT,BSRT;Margaret Best, MS, Exercise Physiologist;Noah Tickle, BS, Exercise Physiologist    Virtual Visit No    Medication changes reported     No    Fall or balance concerns reported    No    Warm-up and Cool-down Performed on first and last piece of equipment    Resistance Training Performed Yes    VAD Patient? No    PAD/SET Patient? No      Pain Assessment   Currently in Pain? No/denies             Tobacco Use History[1]  Goals Met:  Independence with exercise equipment Exercise tolerated well No report of concerns or symptoms today Strength training completed today  Goals Unmet:  Not Applicable  Comments: Pt able to follow exercise prescription today without complaint.  Will continue to monitor for progression.    Dr. Oneil Pinal is Medical Director for [facility] Cardiac Rehabilitation.  Dr. Fuad Aleskerov is Medical Director for [facility] Pulmonary Rehabilitation.    [1]  Social History Tobacco Use  Smoking Status Some Days   Types: E-cigarettes  Smokeless Tobacco Never

## 2024-03-13 ENCOUNTER — Encounter

## 2024-03-14 ENCOUNTER — Ambulatory Visit (HOSPITAL_COMMUNITY)

## 2024-03-15 ENCOUNTER — Encounter

## 2024-03-16 ENCOUNTER — Encounter

## 2024-03-20 ENCOUNTER — Encounter

## 2024-03-22 ENCOUNTER — Encounter

## 2024-03-23 ENCOUNTER — Encounter

## 2024-03-27 ENCOUNTER — Encounter

## 2024-03-28 ENCOUNTER — Ambulatory Visit: Admitting: Cardiovascular Disease

## 2024-03-29 ENCOUNTER — Encounter

## 2024-03-30 ENCOUNTER — Encounter

## 2024-04-03 ENCOUNTER — Encounter

## 2024-04-05 ENCOUNTER — Encounter

## 2024-04-06 ENCOUNTER — Encounter

## 2024-04-10 ENCOUNTER — Encounter

## 2024-04-12 ENCOUNTER — Encounter

## 2024-04-13 ENCOUNTER — Encounter

## 2024-04-17 ENCOUNTER — Encounter

## 2024-04-19 ENCOUNTER — Encounter

## 2024-04-20 ENCOUNTER — Encounter

## 2024-04-24 ENCOUNTER — Encounter

## 2024-04-26 ENCOUNTER — Encounter

## 2024-04-27 ENCOUNTER — Encounter

## 2024-05-01 ENCOUNTER — Encounter

## 2024-05-03 ENCOUNTER — Encounter

## 2024-05-04 ENCOUNTER — Encounter

## 2024-05-08 ENCOUNTER — Encounter

## 2024-05-10 ENCOUNTER — Encounter

## 2024-05-11 ENCOUNTER — Encounter

## 2024-05-15 ENCOUNTER — Encounter

## 2024-05-17 ENCOUNTER — Encounter

## 2024-05-18 ENCOUNTER — Encounter

## 2024-05-22 ENCOUNTER — Encounter

## 2024-06-09 ENCOUNTER — Ambulatory Visit (HOSPITAL_COMMUNITY): Admitting: Internal Medicine
